# Patient Record
Sex: Male | Born: 1945
Health system: Southern US, Community
[De-identification: ages and names within clinical notes are randomized; demographics above are authoritative.]

## PROBLEM LIST (undated history)

## (undated) DIAGNOSIS — I259 Chronic ischemic heart disease, unspecified: Secondary | ICD-10-CM

## (undated) DIAGNOSIS — J45909 Unspecified asthma, uncomplicated: Secondary | ICD-10-CM

## (undated) DIAGNOSIS — M1990 Unspecified osteoarthritis, unspecified site: Secondary | ICD-10-CM

## (undated) DIAGNOSIS — I10 Essential (primary) hypertension: Secondary | ICD-10-CM

## (undated) DIAGNOSIS — E119 Type 2 diabetes mellitus without complications: Secondary | ICD-10-CM

## (undated) DIAGNOSIS — J439 Emphysema, unspecified: Secondary | ICD-10-CM

## (undated) HISTORY — DX: Chronic ischemic heart disease, unspecified: I25.9

## (undated) HISTORY — DX: Essential (primary) hypertension: I10

## (undated) HISTORY — DX: Emphysema, unspecified: J43.9

## (undated) HISTORY — PX: HERNIA REPAIR: SHX51

## (undated) HISTORY — DX: Type 2 diabetes mellitus without complications: E11.9

## (undated) HISTORY — DX: Unspecified osteoarthritis, unspecified site: M19.90

## (undated) HISTORY — PX: COLONOSCOPY: SHX174

---

## 2015-07-12 DIAGNOSIS — F329 Major depressive disorder, single episode, unspecified: Secondary | ICD-10-CM | POA: Diagnosis not present

## 2015-07-28 DIAGNOSIS — B351 Tinea unguium: Secondary | ICD-10-CM | POA: Diagnosis not present

## 2015-07-28 DIAGNOSIS — E1140 Type 2 diabetes mellitus with diabetic neuropathy, unspecified: Secondary | ICD-10-CM | POA: Diagnosis not present

## 2015-08-16 DIAGNOSIS — Z87891 Personal history of nicotine dependence: Secondary | ICD-10-CM | POA: Diagnosis not present

## 2015-08-16 DIAGNOSIS — E1165 Type 2 diabetes mellitus with hyperglycemia: Secondary | ICD-10-CM | POA: Diagnosis not present

## 2015-08-16 DIAGNOSIS — L299 Pruritus, unspecified: Secondary | ICD-10-CM | POA: Diagnosis not present

## 2015-09-06 DIAGNOSIS — I10 Essential (primary) hypertension: Secondary | ICD-10-CM | POA: Diagnosis not present

## 2015-09-06 DIAGNOSIS — K219 Gastro-esophageal reflux disease without esophagitis: Secondary | ICD-10-CM | POA: Diagnosis not present

## 2015-09-06 DIAGNOSIS — L0291 Cutaneous abscess, unspecified: Secondary | ICD-10-CM | POA: Diagnosis not present

## 2015-09-16 DIAGNOSIS — J019 Acute sinusitis, unspecified: Secondary | ICD-10-CM | POA: Diagnosis not present

## 2015-09-16 DIAGNOSIS — Z6826 Body mass index (BMI) 26.0-26.9, adult: Secondary | ICD-10-CM | POA: Diagnosis not present

## 2015-09-16 DIAGNOSIS — F419 Anxiety disorder, unspecified: Secondary | ICD-10-CM | POA: Diagnosis not present

## 2015-09-16 DIAGNOSIS — Z87891 Personal history of nicotine dependence: Secondary | ICD-10-CM | POA: Diagnosis not present

## 2015-09-16 DIAGNOSIS — E1165 Type 2 diabetes mellitus with hyperglycemia: Secondary | ICD-10-CM | POA: Diagnosis not present

## 2015-09-16 DIAGNOSIS — I10 Essential (primary) hypertension: Secondary | ICD-10-CM | POA: Diagnosis not present

## 2015-09-22 DIAGNOSIS — Z85828 Personal history of other malignant neoplasm of skin: Secondary | ICD-10-CM | POA: Diagnosis not present

## 2015-09-22 DIAGNOSIS — L905 Scar conditions and fibrosis of skin: Secondary | ICD-10-CM | POA: Diagnosis not present

## 2015-09-22 DIAGNOSIS — L570 Actinic keratosis: Secondary | ICD-10-CM | POA: Diagnosis not present

## 2015-09-22 DIAGNOSIS — D485 Neoplasm of uncertain behavior of skin: Secondary | ICD-10-CM | POA: Diagnosis not present

## 2015-09-22 DIAGNOSIS — L281 Prurigo nodularis: Secondary | ICD-10-CM | POA: Diagnosis not present

## 2015-11-22 DIAGNOSIS — E1165 Type 2 diabetes mellitus with hyperglycemia: Secondary | ICD-10-CM | POA: Diagnosis not present

## 2015-11-22 DIAGNOSIS — Z299 Encounter for prophylactic measures, unspecified: Secondary | ICD-10-CM | POA: Diagnosis not present

## 2015-11-25 DIAGNOSIS — E119 Type 2 diabetes mellitus without complications: Secondary | ICD-10-CM | POA: Diagnosis not present

## 2015-11-25 DIAGNOSIS — I10 Essential (primary) hypertension: Secondary | ICD-10-CM | POA: Diagnosis not present

## 2015-12-22 DIAGNOSIS — Z23 Encounter for immunization: Secondary | ICD-10-CM | POA: Diagnosis not present

## 2016-01-10 DIAGNOSIS — F329 Major depressive disorder, single episode, unspecified: Secondary | ICD-10-CM | POA: Diagnosis not present

## 2016-01-10 DIAGNOSIS — Z79899 Other long term (current) drug therapy: Secondary | ICD-10-CM | POA: Diagnosis not present

## 2016-01-26 DIAGNOSIS — E1140 Type 2 diabetes mellitus with diabetic neuropathy, unspecified: Secondary | ICD-10-CM | POA: Diagnosis not present

## 2016-01-26 DIAGNOSIS — B351 Tinea unguium: Secondary | ICD-10-CM | POA: Diagnosis not present

## 2016-02-02 DIAGNOSIS — I10 Essential (primary) hypertension: Secondary | ICD-10-CM | POA: Diagnosis not present

## 2016-02-02 DIAGNOSIS — E119 Type 2 diabetes mellitus without complications: Secondary | ICD-10-CM | POA: Diagnosis not present

## 2016-02-25 DIAGNOSIS — I10 Essential (primary) hypertension: Secondary | ICD-10-CM | POA: Diagnosis not present

## 2016-02-25 DIAGNOSIS — E785 Hyperlipidemia, unspecified: Secondary | ICD-10-CM | POA: Diagnosis not present

## 2016-02-25 DIAGNOSIS — K219 Gastro-esophageal reflux disease without esophagitis: Secondary | ICD-10-CM | POA: Diagnosis not present

## 2016-02-25 DIAGNOSIS — E1165 Type 2 diabetes mellitus with hyperglycemia: Secondary | ICD-10-CM | POA: Diagnosis not present

## 2016-03-28 DIAGNOSIS — L570 Actinic keratosis: Secondary | ICD-10-CM | POA: Diagnosis not present

## 2016-04-03 DIAGNOSIS — Z23 Encounter for immunization: Secondary | ICD-10-CM | POA: Diagnosis not present

## 2016-04-19 DIAGNOSIS — B351 Tinea unguium: Secondary | ICD-10-CM | POA: Diagnosis not present

## 2016-04-19 DIAGNOSIS — E1140 Type 2 diabetes mellitus with diabetic neuropathy, unspecified: Secondary | ICD-10-CM | POA: Diagnosis not present

## 2016-05-19 DIAGNOSIS — Z1389 Encounter for screening for other disorder: Secondary | ICD-10-CM | POA: Diagnosis not present

## 2016-05-19 DIAGNOSIS — Z6824 Body mass index (BMI) 24.0-24.9, adult: Secondary | ICD-10-CM | POA: Diagnosis not present

## 2016-05-19 DIAGNOSIS — Z125 Encounter for screening for malignant neoplasm of prostate: Secondary | ICD-10-CM | POA: Diagnosis not present

## 2016-05-19 DIAGNOSIS — Z1211 Encounter for screening for malignant neoplasm of colon: Secondary | ICD-10-CM | POA: Diagnosis not present

## 2016-05-19 DIAGNOSIS — Z79899 Other long term (current) drug therapy: Secondary | ICD-10-CM | POA: Diagnosis not present

## 2016-05-19 DIAGNOSIS — Z0000 Encounter for general adult medical examination without abnormal findings: Secondary | ICD-10-CM | POA: Diagnosis not present

## 2016-05-19 DIAGNOSIS — F419 Anxiety disorder, unspecified: Secondary | ICD-10-CM | POA: Diagnosis not present

## 2016-05-19 DIAGNOSIS — Z7189 Other specified counseling: Secondary | ICD-10-CM | POA: Diagnosis not present

## 2016-05-19 DIAGNOSIS — Z299 Encounter for prophylactic measures, unspecified: Secondary | ICD-10-CM | POA: Diagnosis not present

## 2016-05-19 DIAGNOSIS — E1165 Type 2 diabetes mellitus with hyperglycemia: Secondary | ICD-10-CM | POA: Diagnosis not present

## 2016-06-02 DIAGNOSIS — E1165 Type 2 diabetes mellitus with hyperglycemia: Secondary | ICD-10-CM | POA: Diagnosis not present

## 2016-06-02 DIAGNOSIS — Z299 Encounter for prophylactic measures, unspecified: Secondary | ICD-10-CM | POA: Diagnosis not present

## 2016-06-02 DIAGNOSIS — Z6824 Body mass index (BMI) 24.0-24.9, adult: Secondary | ICD-10-CM | POA: Diagnosis not present

## 2016-06-02 DIAGNOSIS — Z713 Dietary counseling and surveillance: Secondary | ICD-10-CM | POA: Diagnosis not present

## 2016-06-19 DIAGNOSIS — I10 Essential (primary) hypertension: Secondary | ICD-10-CM | POA: Diagnosis not present

## 2016-06-19 DIAGNOSIS — E119 Type 2 diabetes mellitus without complications: Secondary | ICD-10-CM | POA: Diagnosis not present

## 2016-06-24 DIAGNOSIS — F329 Major depressive disorder, single episode, unspecified: Secondary | ICD-10-CM | POA: Diagnosis not present

## 2016-06-24 DIAGNOSIS — Z79899 Other long term (current) drug therapy: Secondary | ICD-10-CM | POA: Diagnosis not present

## 2016-08-17 DIAGNOSIS — Z6824 Body mass index (BMI) 24.0-24.9, adult: Secondary | ICD-10-CM | POA: Diagnosis not present

## 2016-08-17 DIAGNOSIS — J069 Acute upper respiratory infection, unspecified: Secondary | ICD-10-CM | POA: Diagnosis not present

## 2016-08-17 DIAGNOSIS — Z299 Encounter for prophylactic measures, unspecified: Secondary | ICD-10-CM | POA: Diagnosis not present

## 2016-08-17 DIAGNOSIS — E1165 Type 2 diabetes mellitus with hyperglycemia: Secondary | ICD-10-CM | POA: Diagnosis not present

## 2016-08-17 DIAGNOSIS — Z87891 Personal history of nicotine dependence: Secondary | ICD-10-CM | POA: Diagnosis not present

## 2016-08-17 DIAGNOSIS — K219 Gastro-esophageal reflux disease without esophagitis: Secondary | ICD-10-CM | POA: Diagnosis not present

## 2016-08-17 DIAGNOSIS — I10 Essential (primary) hypertension: Secondary | ICD-10-CM | POA: Diagnosis not present

## 2016-08-17 DIAGNOSIS — Z713 Dietary counseling and surveillance: Secondary | ICD-10-CM | POA: Diagnosis not present

## 2016-08-28 DIAGNOSIS — E119 Type 2 diabetes mellitus without complications: Secondary | ICD-10-CM | POA: Diagnosis not present

## 2016-08-28 DIAGNOSIS — I10 Essential (primary) hypertension: Secondary | ICD-10-CM | POA: Diagnosis not present

## 2016-09-07 DIAGNOSIS — E785 Hyperlipidemia, unspecified: Secondary | ICD-10-CM | POA: Diagnosis not present

## 2016-09-07 DIAGNOSIS — K219 Gastro-esophageal reflux disease without esophagitis: Secondary | ICD-10-CM | POA: Diagnosis not present

## 2016-09-07 DIAGNOSIS — E1165 Type 2 diabetes mellitus with hyperglycemia: Secondary | ICD-10-CM | POA: Diagnosis not present

## 2016-09-07 DIAGNOSIS — Z6824 Body mass index (BMI) 24.0-24.9, adult: Secondary | ICD-10-CM | POA: Diagnosis not present

## 2016-09-07 DIAGNOSIS — G4700 Insomnia, unspecified: Secondary | ICD-10-CM | POA: Diagnosis not present

## 2016-09-07 DIAGNOSIS — Z713 Dietary counseling and surveillance: Secondary | ICD-10-CM | POA: Diagnosis not present

## 2016-09-07 DIAGNOSIS — Z299 Encounter for prophylactic measures, unspecified: Secondary | ICD-10-CM | POA: Diagnosis not present

## 2016-09-27 DIAGNOSIS — L570 Actinic keratosis: Secondary | ICD-10-CM | POA: Diagnosis not present

## 2016-10-12 DIAGNOSIS — Z299 Encounter for prophylactic measures, unspecified: Secondary | ICD-10-CM | POA: Diagnosis not present

## 2016-10-12 DIAGNOSIS — E1165 Type 2 diabetes mellitus with hyperglycemia: Secondary | ICD-10-CM | POA: Diagnosis not present

## 2016-10-12 DIAGNOSIS — G4700 Insomnia, unspecified: Secondary | ICD-10-CM | POA: Diagnosis not present

## 2016-10-12 DIAGNOSIS — M545 Low back pain: Secondary | ICD-10-CM | POA: Diagnosis not present

## 2016-10-12 DIAGNOSIS — I10 Essential (primary) hypertension: Secondary | ICD-10-CM | POA: Diagnosis not present

## 2016-10-12 DIAGNOSIS — Z6824 Body mass index (BMI) 24.0-24.9, adult: Secondary | ICD-10-CM | POA: Diagnosis not present

## 2016-11-02 DIAGNOSIS — I10 Essential (primary) hypertension: Secondary | ICD-10-CM | POA: Diagnosis not present

## 2016-11-02 DIAGNOSIS — E119 Type 2 diabetes mellitus without complications: Secondary | ICD-10-CM | POA: Diagnosis not present

## 2016-11-08 DIAGNOSIS — B351 Tinea unguium: Secondary | ICD-10-CM | POA: Diagnosis not present

## 2016-11-08 DIAGNOSIS — E1140 Type 2 diabetes mellitus with diabetic neuropathy, unspecified: Secondary | ICD-10-CM | POA: Diagnosis not present

## 2016-12-07 DIAGNOSIS — I10 Essential (primary) hypertension: Secondary | ICD-10-CM | POA: Diagnosis not present

## 2016-12-07 DIAGNOSIS — E119 Type 2 diabetes mellitus without complications: Secondary | ICD-10-CM | POA: Diagnosis not present

## 2016-12-14 DIAGNOSIS — Z299 Encounter for prophylactic measures, unspecified: Secondary | ICD-10-CM | POA: Diagnosis not present

## 2016-12-14 DIAGNOSIS — M545 Low back pain: Secondary | ICD-10-CM | POA: Diagnosis not present

## 2016-12-14 DIAGNOSIS — I10 Essential (primary) hypertension: Secondary | ICD-10-CM | POA: Diagnosis not present

## 2016-12-14 DIAGNOSIS — E1165 Type 2 diabetes mellitus with hyperglycemia: Secondary | ICD-10-CM | POA: Diagnosis not present

## 2016-12-14 DIAGNOSIS — E785 Hyperlipidemia, unspecified: Secondary | ICD-10-CM | POA: Diagnosis not present

## 2016-12-14 DIAGNOSIS — Z713 Dietary counseling and surveillance: Secondary | ICD-10-CM | POA: Diagnosis not present

## 2016-12-14 DIAGNOSIS — K219 Gastro-esophageal reflux disease without esophagitis: Secondary | ICD-10-CM | POA: Diagnosis not present

## 2016-12-14 DIAGNOSIS — F419 Anxiety disorder, unspecified: Secondary | ICD-10-CM | POA: Diagnosis not present

## 2016-12-14 DIAGNOSIS — Z6824 Body mass index (BMI) 24.0-24.9, adult: Secondary | ICD-10-CM | POA: Diagnosis not present

## 2016-12-18 DIAGNOSIS — F329 Major depressive disorder, single episode, unspecified: Secondary | ICD-10-CM | POA: Diagnosis not present

## 2016-12-18 DIAGNOSIS — Z79899 Other long term (current) drug therapy: Secondary | ICD-10-CM | POA: Diagnosis not present

## 2016-12-21 DIAGNOSIS — M545 Low back pain: Secondary | ICD-10-CM | POA: Diagnosis not present

## 2016-12-27 DIAGNOSIS — M545 Low back pain: Secondary | ICD-10-CM | POA: Diagnosis not present

## 2016-12-29 DIAGNOSIS — M545 Low back pain: Secondary | ICD-10-CM | POA: Diagnosis not present

## 2017-01-03 DIAGNOSIS — M545 Low back pain: Secondary | ICD-10-CM | POA: Diagnosis not present

## 2017-01-05 DIAGNOSIS — M545 Low back pain: Secondary | ICD-10-CM | POA: Diagnosis not present

## 2017-01-09 DIAGNOSIS — Z6823 Body mass index (BMI) 23.0-23.9, adult: Secondary | ICD-10-CM | POA: Diagnosis not present

## 2017-01-09 DIAGNOSIS — I10 Essential (primary) hypertension: Secondary | ICD-10-CM | POA: Diagnosis not present

## 2017-01-09 DIAGNOSIS — M545 Low back pain: Secondary | ICD-10-CM | POA: Diagnosis not present

## 2017-01-09 DIAGNOSIS — Z299 Encounter for prophylactic measures, unspecified: Secondary | ICD-10-CM | POA: Diagnosis not present

## 2017-01-09 DIAGNOSIS — M5136 Other intervertebral disc degeneration, lumbar region: Secondary | ICD-10-CM | POA: Diagnosis not present

## 2017-01-09 DIAGNOSIS — G4762 Sleep related leg cramps: Secondary | ICD-10-CM | POA: Diagnosis not present

## 2017-01-09 DIAGNOSIS — E1165 Type 2 diabetes mellitus with hyperglycemia: Secondary | ICD-10-CM | POA: Diagnosis not present

## 2017-01-15 DIAGNOSIS — M4807 Spinal stenosis, lumbosacral region: Secondary | ICD-10-CM | POA: Diagnosis not present

## 2017-01-15 DIAGNOSIS — M545 Low back pain: Secondary | ICD-10-CM | POA: Diagnosis not present

## 2017-01-15 DIAGNOSIS — M47817 Spondylosis without myelopathy or radiculopathy, lumbosacral region: Secondary | ICD-10-CM | POA: Diagnosis not present

## 2017-01-18 DIAGNOSIS — E119 Type 2 diabetes mellitus without complications: Secondary | ICD-10-CM | POA: Diagnosis not present

## 2017-01-18 DIAGNOSIS — I10 Essential (primary) hypertension: Secondary | ICD-10-CM | POA: Diagnosis not present

## 2017-01-31 DIAGNOSIS — B351 Tinea unguium: Secondary | ICD-10-CM | POA: Diagnosis not present

## 2017-01-31 DIAGNOSIS — E1140 Type 2 diabetes mellitus with diabetic neuropathy, unspecified: Secondary | ICD-10-CM | POA: Diagnosis not present

## 2017-02-20 DIAGNOSIS — Z6824 Body mass index (BMI) 24.0-24.9, adult: Secondary | ICD-10-CM | POA: Diagnosis not present

## 2017-02-20 DIAGNOSIS — E785 Hyperlipidemia, unspecified: Secondary | ICD-10-CM | POA: Diagnosis not present

## 2017-02-20 DIAGNOSIS — Z299 Encounter for prophylactic measures, unspecified: Secondary | ICD-10-CM | POA: Diagnosis not present

## 2017-02-20 DIAGNOSIS — Z713 Dietary counseling and surveillance: Secondary | ICD-10-CM | POA: Diagnosis not present

## 2017-02-20 DIAGNOSIS — J069 Acute upper respiratory infection, unspecified: Secondary | ICD-10-CM | POA: Diagnosis not present

## 2017-02-20 DIAGNOSIS — E1165 Type 2 diabetes mellitus with hyperglycemia: Secondary | ICD-10-CM | POA: Diagnosis not present

## 2017-02-20 DIAGNOSIS — F419 Anxiety disorder, unspecified: Secondary | ICD-10-CM | POA: Diagnosis not present

## 2017-02-20 DIAGNOSIS — M545 Low back pain: Secondary | ICD-10-CM | POA: Diagnosis not present

## 2017-02-20 DIAGNOSIS — I10 Essential (primary) hypertension: Secondary | ICD-10-CM | POA: Diagnosis not present

## 2017-03-21 DIAGNOSIS — J309 Allergic rhinitis, unspecified: Secondary | ICD-10-CM | POA: Diagnosis not present

## 2017-03-21 DIAGNOSIS — E785 Hyperlipidemia, unspecified: Secondary | ICD-10-CM | POA: Diagnosis not present

## 2017-03-21 DIAGNOSIS — M5136 Other intervertebral disc degeneration, lumbar region: Secondary | ICD-10-CM | POA: Diagnosis not present

## 2017-03-21 DIAGNOSIS — E1165 Type 2 diabetes mellitus with hyperglycemia: Secondary | ICD-10-CM | POA: Diagnosis not present

## 2017-03-21 DIAGNOSIS — Z299 Encounter for prophylactic measures, unspecified: Secondary | ICD-10-CM | POA: Diagnosis not present

## 2017-03-21 DIAGNOSIS — Z6824 Body mass index (BMI) 24.0-24.9, adult: Secondary | ICD-10-CM | POA: Diagnosis not present

## 2017-03-21 DIAGNOSIS — I10 Essential (primary) hypertension: Secondary | ICD-10-CM | POA: Diagnosis not present

## 2017-04-02 DIAGNOSIS — L280 Lichen simplex chronicus: Secondary | ICD-10-CM | POA: Diagnosis not present

## 2017-04-02 DIAGNOSIS — L720 Epidermal cyst: Secondary | ICD-10-CM | POA: Diagnosis not present

## 2017-04-02 DIAGNOSIS — L570 Actinic keratosis: Secondary | ICD-10-CM | POA: Diagnosis not present

## 2017-04-05 DIAGNOSIS — M4696 Unspecified inflammatory spondylopathy, lumbar region: Secondary | ICD-10-CM | POA: Diagnosis not present

## 2017-04-11 DIAGNOSIS — E1165 Type 2 diabetes mellitus with hyperglycemia: Secondary | ICD-10-CM | POA: Diagnosis not present

## 2017-04-11 DIAGNOSIS — M5136 Other intervertebral disc degeneration, lumbar region: Secondary | ICD-10-CM | POA: Diagnosis not present

## 2017-04-11 DIAGNOSIS — Z6823 Body mass index (BMI) 23.0-23.9, adult: Secondary | ICD-10-CM | POA: Diagnosis not present

## 2017-04-11 DIAGNOSIS — Z23 Encounter for immunization: Secondary | ICD-10-CM | POA: Diagnosis not present

## 2017-04-11 DIAGNOSIS — Z299 Encounter for prophylactic measures, unspecified: Secondary | ICD-10-CM | POA: Diagnosis not present

## 2017-04-11 DIAGNOSIS — I10 Essential (primary) hypertension: Secondary | ICD-10-CM | POA: Diagnosis not present

## 2017-04-19 DIAGNOSIS — I10 Essential (primary) hypertension: Secondary | ICD-10-CM | POA: Diagnosis not present

## 2017-04-19 DIAGNOSIS — E119 Type 2 diabetes mellitus without complications: Secondary | ICD-10-CM | POA: Diagnosis not present

## 2017-04-25 DIAGNOSIS — B351 Tinea unguium: Secondary | ICD-10-CM | POA: Diagnosis not present

## 2017-04-25 DIAGNOSIS — E1140 Type 2 diabetes mellitus with diabetic neuropathy, unspecified: Secondary | ICD-10-CM | POA: Diagnosis not present

## 2017-05-22 DIAGNOSIS — E119 Type 2 diabetes mellitus without complications: Secondary | ICD-10-CM | POA: Diagnosis not present

## 2017-05-22 DIAGNOSIS — I10 Essential (primary) hypertension: Secondary | ICD-10-CM | POA: Diagnosis not present

## 2017-05-24 DIAGNOSIS — Z7189 Other specified counseling: Secondary | ICD-10-CM | POA: Diagnosis not present

## 2017-05-24 DIAGNOSIS — R5383 Other fatigue: Secondary | ICD-10-CM | POA: Diagnosis not present

## 2017-05-24 DIAGNOSIS — Z79899 Other long term (current) drug therapy: Secondary | ICD-10-CM | POA: Diagnosis not present

## 2017-05-24 DIAGNOSIS — Z125 Encounter for screening for malignant neoplasm of prostate: Secondary | ICD-10-CM | POA: Diagnosis not present

## 2017-05-24 DIAGNOSIS — Z1331 Encounter for screening for depression: Secondary | ICD-10-CM | POA: Diagnosis not present

## 2017-05-24 DIAGNOSIS — E785 Hyperlipidemia, unspecified: Secondary | ICD-10-CM | POA: Diagnosis not present

## 2017-05-24 DIAGNOSIS — Z1339 Encounter for screening examination for other mental health and behavioral disorders: Secondary | ICD-10-CM | POA: Diagnosis not present

## 2017-05-24 DIAGNOSIS — F419 Anxiety disorder, unspecified: Secondary | ICD-10-CM | POA: Diagnosis not present

## 2017-05-24 DIAGNOSIS — E1165 Type 2 diabetes mellitus with hyperglycemia: Secondary | ICD-10-CM | POA: Diagnosis not present

## 2017-05-24 DIAGNOSIS — Z299 Encounter for prophylactic measures, unspecified: Secondary | ICD-10-CM | POA: Diagnosis not present

## 2017-05-24 DIAGNOSIS — Z0000 Encounter for general adult medical examination without abnormal findings: Secondary | ICD-10-CM | POA: Diagnosis not present

## 2017-05-24 DIAGNOSIS — Z1211 Encounter for screening for malignant neoplasm of colon: Secondary | ICD-10-CM | POA: Diagnosis not present

## 2017-06-04 DIAGNOSIS — F329 Major depressive disorder, single episode, unspecified: Secondary | ICD-10-CM | POA: Diagnosis not present

## 2017-06-04 DIAGNOSIS — Z79899 Other long term (current) drug therapy: Secondary | ICD-10-CM | POA: Diagnosis not present

## 2017-06-15 DIAGNOSIS — M545 Low back pain: Secondary | ICD-10-CM | POA: Diagnosis not present

## 2017-06-15 DIAGNOSIS — M25562 Pain in left knee: Secondary | ICD-10-CM | POA: Diagnosis not present

## 2017-06-15 DIAGNOSIS — Z79891 Long term (current) use of opiate analgesic: Secondary | ICD-10-CM | POA: Diagnosis not present

## 2017-06-15 DIAGNOSIS — M130 Polyarthritis, unspecified: Secondary | ICD-10-CM | POA: Diagnosis not present

## 2017-06-15 DIAGNOSIS — M542 Cervicalgia: Secondary | ICD-10-CM | POA: Diagnosis not present

## 2017-06-15 DIAGNOSIS — M25569 Pain in unspecified knee: Secondary | ICD-10-CM | POA: Diagnosis not present

## 2017-06-27 DIAGNOSIS — Z6823 Body mass index (BMI) 23.0-23.9, adult: Secondary | ICD-10-CM | POA: Diagnosis not present

## 2017-06-27 DIAGNOSIS — Z713 Dietary counseling and surveillance: Secondary | ICD-10-CM | POA: Diagnosis not present

## 2017-06-27 DIAGNOSIS — E1165 Type 2 diabetes mellitus with hyperglycemia: Secondary | ICD-10-CM | POA: Diagnosis not present

## 2017-06-27 DIAGNOSIS — I10 Essential (primary) hypertension: Secondary | ICD-10-CM | POA: Diagnosis not present

## 2017-06-27 DIAGNOSIS — Z299 Encounter for prophylactic measures, unspecified: Secondary | ICD-10-CM | POA: Diagnosis not present

## 2017-07-18 DIAGNOSIS — E1140 Type 2 diabetes mellitus with diabetic neuropathy, unspecified: Secondary | ICD-10-CM | POA: Diagnosis not present

## 2017-07-18 DIAGNOSIS — B351 Tinea unguium: Secondary | ICD-10-CM | POA: Diagnosis not present

## 2017-07-25 DIAGNOSIS — Z79891 Long term (current) use of opiate analgesic: Secondary | ICD-10-CM | POA: Diagnosis not present

## 2017-07-25 DIAGNOSIS — M545 Low back pain: Secondary | ICD-10-CM | POA: Diagnosis not present

## 2017-07-25 DIAGNOSIS — M542 Cervicalgia: Secondary | ICD-10-CM | POA: Diagnosis not present

## 2017-07-25 DIAGNOSIS — M25569 Pain in unspecified knee: Secondary | ICD-10-CM | POA: Diagnosis not present

## 2017-07-25 DIAGNOSIS — M130 Polyarthritis, unspecified: Secondary | ICD-10-CM | POA: Diagnosis not present

## 2017-07-30 DIAGNOSIS — E1165 Type 2 diabetes mellitus with hyperglycemia: Secondary | ICD-10-CM | POA: Diagnosis not present

## 2017-07-30 DIAGNOSIS — Z299 Encounter for prophylactic measures, unspecified: Secondary | ICD-10-CM | POA: Diagnosis not present

## 2017-07-30 DIAGNOSIS — Z87891 Personal history of nicotine dependence: Secondary | ICD-10-CM | POA: Diagnosis not present

## 2017-07-30 DIAGNOSIS — I10 Essential (primary) hypertension: Secondary | ICD-10-CM | POA: Diagnosis not present

## 2017-07-30 DIAGNOSIS — Z6824 Body mass index (BMI) 24.0-24.9, adult: Secondary | ICD-10-CM | POA: Diagnosis not present

## 2017-07-30 DIAGNOSIS — J309 Allergic rhinitis, unspecified: Secondary | ICD-10-CM | POA: Diagnosis not present

## 2017-07-30 DIAGNOSIS — L0291 Cutaneous abscess, unspecified: Secondary | ICD-10-CM | POA: Diagnosis not present

## 2017-08-07 DIAGNOSIS — E119 Type 2 diabetes mellitus without complications: Secondary | ICD-10-CM | POA: Diagnosis not present

## 2017-08-07 DIAGNOSIS — I10 Essential (primary) hypertension: Secondary | ICD-10-CM | POA: Diagnosis not present

## 2017-08-13 DIAGNOSIS — Z789 Other specified health status: Secondary | ICD-10-CM | POA: Diagnosis not present

## 2017-08-13 DIAGNOSIS — I10 Essential (primary) hypertension: Secondary | ICD-10-CM | POA: Diagnosis not present

## 2017-08-13 DIAGNOSIS — Z299 Encounter for prophylactic measures, unspecified: Secondary | ICD-10-CM | POA: Diagnosis not present

## 2017-08-13 DIAGNOSIS — E1165 Type 2 diabetes mellitus with hyperglycemia: Secondary | ICD-10-CM | POA: Diagnosis not present

## 2017-08-13 DIAGNOSIS — Z6824 Body mass index (BMI) 24.0-24.9, adult: Secondary | ICD-10-CM | POA: Diagnosis not present

## 2017-08-13 DIAGNOSIS — J069 Acute upper respiratory infection, unspecified: Secondary | ICD-10-CM | POA: Diagnosis not present

## 2017-08-27 DIAGNOSIS — M130 Polyarthritis, unspecified: Secondary | ICD-10-CM | POA: Diagnosis not present

## 2017-08-27 DIAGNOSIS — M25569 Pain in unspecified knee: Secondary | ICD-10-CM | POA: Diagnosis not present

## 2017-08-27 DIAGNOSIS — M545 Low back pain: Secondary | ICD-10-CM | POA: Diagnosis not present

## 2017-08-27 DIAGNOSIS — M542 Cervicalgia: Secondary | ICD-10-CM | POA: Diagnosis not present

## 2017-08-30 DIAGNOSIS — Z6825 Body mass index (BMI) 25.0-25.9, adult: Secondary | ICD-10-CM | POA: Diagnosis not present

## 2017-08-30 DIAGNOSIS — E1165 Type 2 diabetes mellitus with hyperglycemia: Secondary | ICD-10-CM | POA: Diagnosis not present

## 2017-08-30 DIAGNOSIS — K047 Periapical abscess without sinus: Secondary | ICD-10-CM | POA: Diagnosis not present

## 2017-08-30 DIAGNOSIS — I10 Essential (primary) hypertension: Secondary | ICD-10-CM | POA: Diagnosis not present

## 2017-08-30 DIAGNOSIS — J069 Acute upper respiratory infection, unspecified: Secondary | ICD-10-CM | POA: Diagnosis not present

## 2017-08-30 DIAGNOSIS — Z299 Encounter for prophylactic measures, unspecified: Secondary | ICD-10-CM | POA: Diagnosis not present

## 2017-09-12 DIAGNOSIS — E1165 Type 2 diabetes mellitus with hyperglycemia: Secondary | ICD-10-CM | POA: Diagnosis not present

## 2017-09-12 DIAGNOSIS — Z6824 Body mass index (BMI) 24.0-24.9, adult: Secondary | ICD-10-CM | POA: Diagnosis not present

## 2017-09-12 DIAGNOSIS — J069 Acute upper respiratory infection, unspecified: Secondary | ICD-10-CM | POA: Diagnosis not present

## 2017-09-12 DIAGNOSIS — Z789 Other specified health status: Secondary | ICD-10-CM | POA: Diagnosis not present

## 2017-09-12 DIAGNOSIS — Z299 Encounter for prophylactic measures, unspecified: Secondary | ICD-10-CM | POA: Diagnosis not present

## 2017-09-12 DIAGNOSIS — I10 Essential (primary) hypertension: Secondary | ICD-10-CM | POA: Diagnosis not present

## 2017-09-17 DIAGNOSIS — L309 Dermatitis, unspecified: Secondary | ICD-10-CM | POA: Diagnosis not present

## 2017-09-17 DIAGNOSIS — D485 Neoplasm of uncertain behavior of skin: Secondary | ICD-10-CM | POA: Diagnosis not present

## 2017-09-17 DIAGNOSIS — L905 Scar conditions and fibrosis of skin: Secondary | ICD-10-CM | POA: Diagnosis not present

## 2017-09-17 DIAGNOSIS — L570 Actinic keratosis: Secondary | ICD-10-CM | POA: Diagnosis not present

## 2017-09-17 DIAGNOSIS — L280 Lichen simplex chronicus: Secondary | ICD-10-CM | POA: Diagnosis not present

## 2017-09-20 DIAGNOSIS — M25569 Pain in unspecified knee: Secondary | ICD-10-CM | POA: Diagnosis not present

## 2017-09-20 DIAGNOSIS — M542 Cervicalgia: Secondary | ICD-10-CM | POA: Diagnosis not present

## 2017-09-20 DIAGNOSIS — M130 Polyarthritis, unspecified: Secondary | ICD-10-CM | POA: Diagnosis not present

## 2017-09-20 DIAGNOSIS — M545 Low back pain: Secondary | ICD-10-CM | POA: Diagnosis not present

## 2017-09-24 DIAGNOSIS — E119 Type 2 diabetes mellitus without complications: Secondary | ICD-10-CM | POA: Diagnosis not present

## 2017-09-24 DIAGNOSIS — I10 Essential (primary) hypertension: Secondary | ICD-10-CM | POA: Diagnosis not present

## 2017-10-02 DIAGNOSIS — J069 Acute upper respiratory infection, unspecified: Secondary | ICD-10-CM | POA: Diagnosis not present

## 2017-10-02 DIAGNOSIS — Z299 Encounter for prophylactic measures, unspecified: Secondary | ICD-10-CM | POA: Diagnosis not present

## 2017-10-02 DIAGNOSIS — Z713 Dietary counseling and surveillance: Secondary | ICD-10-CM | POA: Diagnosis not present

## 2017-10-02 DIAGNOSIS — E1165 Type 2 diabetes mellitus with hyperglycemia: Secondary | ICD-10-CM | POA: Diagnosis not present

## 2017-10-02 DIAGNOSIS — Z6824 Body mass index (BMI) 24.0-24.9, adult: Secondary | ICD-10-CM | POA: Diagnosis not present

## 2017-10-12 DIAGNOSIS — E119 Type 2 diabetes mellitus without complications: Secondary | ICD-10-CM | POA: Diagnosis not present

## 2017-10-12 DIAGNOSIS — I10 Essential (primary) hypertension: Secondary | ICD-10-CM | POA: Diagnosis not present

## 2017-10-18 DIAGNOSIS — M130 Polyarthritis, unspecified: Secondary | ICD-10-CM | POA: Diagnosis not present

## 2017-10-18 DIAGNOSIS — M542 Cervicalgia: Secondary | ICD-10-CM | POA: Diagnosis not present

## 2017-10-18 DIAGNOSIS — M545 Low back pain: Secondary | ICD-10-CM | POA: Diagnosis not present

## 2017-10-18 DIAGNOSIS — G894 Chronic pain syndrome: Secondary | ICD-10-CM | POA: Diagnosis not present

## 2017-10-18 DIAGNOSIS — M25569 Pain in unspecified knee: Secondary | ICD-10-CM | POA: Diagnosis not present

## 2017-10-18 DIAGNOSIS — Z79899 Other long term (current) drug therapy: Secondary | ICD-10-CM | POA: Diagnosis not present

## 2017-10-22 DIAGNOSIS — R918 Other nonspecific abnormal finding of lung field: Secondary | ICD-10-CM | POA: Diagnosis not present

## 2017-10-22 DIAGNOSIS — R05 Cough: Secondary | ICD-10-CM | POA: Diagnosis not present

## 2017-10-22 DIAGNOSIS — J069 Acute upper respiratory infection, unspecified: Secondary | ICD-10-CM | POA: Diagnosis not present

## 2017-10-25 DIAGNOSIS — B351 Tinea unguium: Secondary | ICD-10-CM | POA: Diagnosis not present

## 2017-10-25 DIAGNOSIS — E1140 Type 2 diabetes mellitus with diabetic neuropathy, unspecified: Secondary | ICD-10-CM | POA: Diagnosis not present

## 2017-10-30 DIAGNOSIS — Z713 Dietary counseling and surveillance: Secondary | ICD-10-CM | POA: Diagnosis not present

## 2017-10-30 DIAGNOSIS — Z8701 Personal history of pneumonia (recurrent): Secondary | ICD-10-CM | POA: Diagnosis not present

## 2017-10-30 DIAGNOSIS — Z87891 Personal history of nicotine dependence: Secondary | ICD-10-CM | POA: Diagnosis not present

## 2017-10-30 DIAGNOSIS — J189 Pneumonia, unspecified organism: Secondary | ICD-10-CM | POA: Diagnosis not present

## 2017-10-30 DIAGNOSIS — Z299 Encounter for prophylactic measures, unspecified: Secondary | ICD-10-CM | POA: Diagnosis not present

## 2017-10-30 DIAGNOSIS — Z6822 Body mass index (BMI) 22.0-22.9, adult: Secondary | ICD-10-CM | POA: Diagnosis not present

## 2017-10-30 DIAGNOSIS — I10 Essential (primary) hypertension: Secondary | ICD-10-CM | POA: Diagnosis not present

## 2017-11-22 DIAGNOSIS — R1110 Vomiting, unspecified: Secondary | ICD-10-CM | POA: Diagnosis not present

## 2017-11-22 DIAGNOSIS — Z299 Encounter for prophylactic measures, unspecified: Secondary | ICD-10-CM | POA: Diagnosis not present

## 2017-11-22 DIAGNOSIS — R42 Dizziness and giddiness: Secondary | ICD-10-CM | POA: Diagnosis not present

## 2017-11-22 DIAGNOSIS — J189 Pneumonia, unspecified organism: Secondary | ICD-10-CM | POA: Diagnosis not present

## 2017-11-22 DIAGNOSIS — I10 Essential (primary) hypertension: Secondary | ICD-10-CM | POA: Diagnosis not present

## 2017-11-22 DIAGNOSIS — Z6823 Body mass index (BMI) 23.0-23.9, adult: Secondary | ICD-10-CM | POA: Diagnosis not present

## 2017-11-23 DIAGNOSIS — I10 Essential (primary) hypertension: Secondary | ICD-10-CM | POA: Diagnosis not present

## 2017-11-23 DIAGNOSIS — E119 Type 2 diabetes mellitus without complications: Secondary | ICD-10-CM | POA: Diagnosis not present

## 2017-12-17 DIAGNOSIS — E538 Deficiency of other specified B group vitamins: Secondary | ICD-10-CM | POA: Diagnosis not present

## 2017-12-17 DIAGNOSIS — E1165 Type 2 diabetes mellitus with hyperglycemia: Secondary | ICD-10-CM | POA: Diagnosis not present

## 2017-12-17 DIAGNOSIS — J069 Acute upper respiratory infection, unspecified: Secondary | ICD-10-CM | POA: Diagnosis not present

## 2017-12-17 DIAGNOSIS — Z6823 Body mass index (BMI) 23.0-23.9, adult: Secondary | ICD-10-CM | POA: Diagnosis not present

## 2017-12-17 DIAGNOSIS — G4700 Insomnia, unspecified: Secondary | ICD-10-CM | POA: Diagnosis not present

## 2017-12-17 DIAGNOSIS — M545 Low back pain: Secondary | ICD-10-CM | POA: Diagnosis not present

## 2017-12-17 DIAGNOSIS — R0600 Dyspnea, unspecified: Secondary | ICD-10-CM | POA: Diagnosis not present

## 2017-12-17 DIAGNOSIS — I10 Essential (primary) hypertension: Secondary | ICD-10-CM | POA: Diagnosis not present

## 2017-12-17 DIAGNOSIS — M130 Polyarthritis, unspecified: Secondary | ICD-10-CM | POA: Diagnosis not present

## 2017-12-17 DIAGNOSIS — M542 Cervicalgia: Secondary | ICD-10-CM | POA: Diagnosis not present

## 2017-12-17 DIAGNOSIS — M25569 Pain in unspecified knee: Secondary | ICD-10-CM | POA: Diagnosis not present

## 2017-12-17 DIAGNOSIS — Z299 Encounter for prophylactic measures, unspecified: Secondary | ICD-10-CM | POA: Diagnosis not present

## 2017-12-21 DIAGNOSIS — Z7984 Long term (current) use of oral hypoglycemic drugs: Secondary | ICD-10-CM | POA: Diagnosis not present

## 2017-12-21 DIAGNOSIS — J439 Emphysema, unspecified: Secondary | ICD-10-CM | POA: Diagnosis not present

## 2017-12-21 DIAGNOSIS — K449 Diaphragmatic hernia without obstruction or gangrene: Secondary | ICD-10-CM | POA: Diagnosis not present

## 2017-12-21 DIAGNOSIS — I700 Atherosclerosis of aorta: Secondary | ICD-10-CM | POA: Diagnosis not present

## 2017-12-21 DIAGNOSIS — E119 Type 2 diabetes mellitus without complications: Secondary | ICD-10-CM | POA: Diagnosis not present

## 2017-12-21 DIAGNOSIS — R918 Other nonspecific abnormal finding of lung field: Secondary | ICD-10-CM | POA: Diagnosis not present

## 2017-12-21 DIAGNOSIS — I2510 Atherosclerotic heart disease of native coronary artery without angina pectoris: Secondary | ICD-10-CM | POA: Diagnosis not present

## 2017-12-22 DIAGNOSIS — F329 Major depressive disorder, single episode, unspecified: Secondary | ICD-10-CM | POA: Diagnosis not present

## 2017-12-27 DIAGNOSIS — E1165 Type 2 diabetes mellitus with hyperglycemia: Secondary | ICD-10-CM | POA: Diagnosis not present

## 2017-12-27 DIAGNOSIS — Z299 Encounter for prophylactic measures, unspecified: Secondary | ICD-10-CM | POA: Diagnosis not present

## 2017-12-27 DIAGNOSIS — Z6823 Body mass index (BMI) 23.0-23.9, adult: Secondary | ICD-10-CM | POA: Diagnosis not present

## 2017-12-27 DIAGNOSIS — I10 Essential (primary) hypertension: Secondary | ICD-10-CM | POA: Diagnosis not present

## 2017-12-27 DIAGNOSIS — L309 Dermatitis, unspecified: Secondary | ICD-10-CM | POA: Diagnosis not present

## 2018-01-04 DIAGNOSIS — I10 Essential (primary) hypertension: Secondary | ICD-10-CM | POA: Diagnosis not present

## 2018-01-04 DIAGNOSIS — E119 Type 2 diabetes mellitus without complications: Secondary | ICD-10-CM | POA: Diagnosis not present

## 2018-01-15 DIAGNOSIS — Z6823 Body mass index (BMI) 23.0-23.9, adult: Secondary | ICD-10-CM | POA: Diagnosis not present

## 2018-01-15 DIAGNOSIS — Z299 Encounter for prophylactic measures, unspecified: Secondary | ICD-10-CM | POA: Diagnosis not present

## 2018-01-15 DIAGNOSIS — Z713 Dietary counseling and surveillance: Secondary | ICD-10-CM | POA: Diagnosis not present

## 2018-01-15 DIAGNOSIS — I10 Essential (primary) hypertension: Secondary | ICD-10-CM | POA: Diagnosis not present

## 2018-01-15 DIAGNOSIS — E1165 Type 2 diabetes mellitus with hyperglycemia: Secondary | ICD-10-CM | POA: Diagnosis not present

## 2018-01-22 ENCOUNTER — Ambulatory Visit (INDEPENDENT_AMBULATORY_CARE_PROVIDER_SITE_OTHER): Payer: Medicare Other | Admitting: Pulmonary Disease

## 2018-01-22 ENCOUNTER — Encounter: Payer: Self-pay | Admitting: Pulmonary Disease

## 2018-01-22 VITALS — BP 124/76 | HR 69 | Ht 65.0 in | Wt 151.2 lb

## 2018-01-22 DIAGNOSIS — J439 Emphysema, unspecified: Secondary | ICD-10-CM | POA: Insufficient documentation

## 2018-01-22 DIAGNOSIS — R911 Solitary pulmonary nodule: Secondary | ICD-10-CM | POA: Diagnosis not present

## 2018-01-22 DIAGNOSIS — R918 Other nonspecific abnormal finding of lung field: Secondary | ICD-10-CM | POA: Insufficient documentation

## 2018-01-22 NOTE — Progress Notes (Signed)
Travis Beck    [id number]    [birth year]  Primary Care 73, Costella Hatcher, MD  Referring Physician: Monico Blitz, [address] [place], [place] [address]  Chief complaint: Consult for recurrent pneumonia, abnormal CT scan  HPI: 72 year old with history of allergic rhinitis, hyperlipidemia, type 2 diabetes, former smoker Treated for pneumonia in May-June 2019.  He required 3 rounds of antibiotic to finally get symptoms under control.  He had imaging including CT of the chest which showed bilateral infiltrates, nodular opacities and has been referred here for further evaluation  States that at present his breathing is back to baseline he has mild dyspnea with activity, cough with clear mucus.  Denies any fevers, chills.  Pets: Has a dog, no cats, birds, farm animals Occupation: Used to work from [employer].  Currently retired Exposures: No known exposures, no leak, hot tub, Jacuzzi, mold Smoking history: 35-pack-year smoker.  Quit in 1994 Travel history: No significant travel.  Outpatient Encounter Medications as of 01/22/2018  Medication Sig  . ALPRAZolam (XANAX) 1 MG tablet Take by mouth.  Marland Kitchen amLODipine (NORVASC) 10 MG tablet Take 10 mg by mouth daily.  . buprenorphine (SUBUTEX) 2 MG SUBL SL tablet 4 mg daily.  . cyanocobalamin (,VITAMIN B-12,) 1000 MCG/ML injection INJECT 1ML INTO THE SKIN ONCE A MONTH  . diclofenac (VOLTAREN) 75 MG EC tablet Take by mouth.  . fluticasone (FLONASE) 50 MCG/ACT nasal spray INSTILL 2 SPRAYS IN EACH NOSTRIL EVERY DAY  . gabapentin (NEURONTIN) 400 MG capsule Take 400 mg by mouth 3 (three) times daily.  Marland Kitchen glyBURIDE (DIABETA) 5 MG tablet TAKE TWO TABLETS BY MOUTH TWICE DAILY EMERGENCY REFILL FAXED DR.  . hydrALAZINE (APRESOLINE) 25 MG tablet Take by mouth.  Marland Kitchen ibuprofen (ADVIL,MOTRIN) 800 MG tablet Take by mouth.  . meclizine (ANTIVERT) 25 MG tablet TAKE 1 TABLET BY MOUTH EVERY 8 HOURS AS NEEDED DIZZINESS  . metFORMIN (GLUCOPHAGE)  1000 MG tablet Take by mouth.  Marland Kitchen omeprazole (PRILOSEC) 20 MG capsule Take by mouth.  . ondansetron (ZOFRAN) 4 MG tablet Take 4 mg by mouth every 8 (eight) hours as needed. for nausea  . rOPINIRole (REQUIP) 2 MG tablet Take 2 mg by mouth at bedtime.  . sitaGLIPtin (JANUVIA) 100 MG tablet Take by mouth.  . triamcinolone cream (KENALOG) 0.1 % APPLY TO THE AFFECTED AREA(S) EVERY 8 HOURS AS NEEDED  . VENTOLIN HFA 108 (90 Base) MCG/ACT inhaler inhale ONE TO TWO puffs EVERY 4 TO 6 HOURS AS NEEDED  . [DISCONTINUED] traZODone (DESYREL) 150 MG tablet Take by mouth.  . [DISCONTINUED] zolpidem (AMBIEN) 5 MG tablet Take 5 mg by mouth at bedtime.   No facility-administered encounter medications on file as of 01/22/2018.     Allergies as of 01/22/2018 - Review Complete 01/22/2018  Allergen Reaction Noted  . Penicillins Rash 12/14/2014    No past medical history on file.  History reviewed. No pertinent surgical history.  No family history on file.  Social History   Socioeconomic History  . Marital status: Married    Spouse name: Not on file  . Number of children: Not on file  . Years of education: Not on file  . Highest education level: Not on file  Occupational History  . Not on file  Social Needs  . Financial resource strain: Not on file  . Food insecurity:    Worry: Not on file    Inability: Not on file  . Transportation needs:  Medical: Not on file    Non-medical: Not on file  Tobacco Use  . Smoking status: Former Smoker    Packs/day: 2.00    Years: 33.00    Pack years: 66.00    Types: Cigarettes    Last attempt to quit: 01/22/1993    Years since quitting: 25.0  . Smokeless tobacco: Never Used  Substance and Sexual Activity  . Alcohol use: Not on file  . Drug use: Not on file  . Sexual activity: Not on file  Lifestyle  . Physical activity:    Days per week: Not on file    Minutes per session: Not on file  . Stress: Not on file  Relationships  . Social connections:     Talks on phone: Not on file    Gets together: Not on file    Attends religious service: Not on file    Active member of club or organization: Not on file    Attends meetings of clubs or organizations: Not on file    Relationship status: Not on file  . Intimate partner violence:    Fear of current or ex partner: Not on file    Emotionally abused: Not on file    Physically abused: Not on file    Forced sexual activity: Not on file  Other Topics Concern  . Not on file  Social History Narrative  . Not on file   Review of systems: Review of Systems  Constitutional: Negative for fever and chills.  HENT: Negative.   Eyes: Negative for blurred vision.  Respiratory: as per HPI  Cardiovascular: Negative for chest pain and palpitations.  Gastrointestinal: Negative for vomiting, diarrhea, blood per rectum. Genitourinary: Negative for dysuria, urgency, frequency and hematuria.  Musculoskeletal: Negative for myalgias, back pain and joint pain.  Skin: Negative for itching and rash.  Neurological: Negative for dizziness, tremors, focal weakness, seizures and loss of consciousness.  Endo/Heme/Allergies: Negative for environmental allergies.  Psychiatric/Behavioral: Negative for depression, suicidal ideas and hallucinations.  All other systems reviewed and are negative.  Physical Exam: Blood pressure 124/76, pulse 69, height 5\' 5"  (1.651 m), weight 151 lb 3.2 oz (68.6 kg), SpO2 98 %. Gen:      No acute distress HEENT:  EOMI, sclera anicteric Neck:     No masses; no thyromegaly Lungs:    Clear to auscultation bilaterally; normal respiratory effort CV:         Regular rate and rhythm; no murmurs Abd:      + bowel sounds; soft, non-tender; no palpable masses, no distension Ext:    No edema; adequate peripheral perfusion Skin:      Warm and dry; no rash Neuro: alert and oriented x 3 Psych: normal mood and affect  Data Reviewed: CT chest 12/21/2017- bilateral peribronchovascular nodularity,  groundglass opacities left greater than right.  Dominant 1.1 cm left upper lobe nodule.  Mild centrilobular, paraseptal emphysema.  Left main and three-vessel coronary atherosclerosis.  Small hiatal hernia.  I have reviewed the images personally  Assessment:  Abnormal CT Recently treated for pneumonia.  CT scan reviewed which showed some nodularity and groundglass opacities.  I suspect this is secondary to pneumonia. However given his smoking history we will need to follow to resolution CT of the chest without contrast in 3 months.  Emphysema CT scan shows mild emphysema Schedule pulmonary function tests for further evaluation.  Plan/Recommendations: - Follow-up CT chest without contrast - Pulmonary function test  Marshell Garfinkel MD [facility] 01/22/2018, 3:07 PM  CC: Monico Blitz, MD

## 2018-01-22 NOTE — Patient Instructions (Signed)
I have reviewed his CT scan which shows some nodules which are likely from pneumonia We will make sure that this clears up.  Will get a follow-up CT without contrast in 3 months We will also order for PFTs in 3 months. Both of these test to be done at [facility] Follow-up in clinic after test.

## 2018-01-23 NOTE — Addendum Note (Signed)
Addended by: Madolyn Frieze on: 01/23/2018 11:38 AM   Modules accepted: Orders

## 2018-01-31 DIAGNOSIS — B351 Tinea unguium: Secondary | ICD-10-CM | POA: Diagnosis not present

## 2018-01-31 DIAGNOSIS — E1140 Type 2 diabetes mellitus with diabetic neuropathy, unspecified: Secondary | ICD-10-CM | POA: Diagnosis not present

## 2018-02-04 DIAGNOSIS — E119 Type 2 diabetes mellitus without complications: Secondary | ICD-10-CM | POA: Diagnosis not present

## 2018-02-04 DIAGNOSIS — Z6822 Body mass index (BMI) 22.0-22.9, adult: Secondary | ICD-10-CM | POA: Diagnosis not present

## 2018-02-04 DIAGNOSIS — L03115 Cellulitis of right lower limb: Secondary | ICD-10-CM | POA: Diagnosis not present

## 2018-02-04 DIAGNOSIS — Z299 Encounter for prophylactic measures, unspecified: Secondary | ICD-10-CM | POA: Diagnosis not present

## 2018-02-04 DIAGNOSIS — I10 Essential (primary) hypertension: Secondary | ICD-10-CM | POA: Diagnosis not present

## 2018-02-04 DIAGNOSIS — Z713 Dietary counseling and surveillance: Secondary | ICD-10-CM | POA: Diagnosis not present

## 2018-02-14 DIAGNOSIS — M545 Low back pain: Secondary | ICD-10-CM | POA: Diagnosis not present

## 2018-02-14 DIAGNOSIS — M130 Polyarthritis, unspecified: Secondary | ICD-10-CM | POA: Diagnosis not present

## 2018-02-14 DIAGNOSIS — M25569 Pain in unspecified knee: Secondary | ICD-10-CM | POA: Diagnosis not present

## 2018-02-14 DIAGNOSIS — M542 Cervicalgia: Secondary | ICD-10-CM | POA: Diagnosis not present

## 2018-03-04 DIAGNOSIS — E119 Type 2 diabetes mellitus without complications: Secondary | ICD-10-CM | POA: Diagnosis not present

## 2018-03-04 DIAGNOSIS — I10 Essential (primary) hypertension: Secondary | ICD-10-CM | POA: Diagnosis not present

## 2018-03-13 DIAGNOSIS — I10 Essential (primary) hypertension: Secondary | ICD-10-CM | POA: Diagnosis not present

## 2018-03-13 DIAGNOSIS — H6090 Unspecified otitis externa, unspecified ear: Secondary | ICD-10-CM | POA: Diagnosis not present

## 2018-03-13 DIAGNOSIS — Z6823 Body mass index (BMI) 23.0-23.9, adult: Secondary | ICD-10-CM | POA: Diagnosis not present

## 2018-03-13 DIAGNOSIS — H6692 Otitis media, unspecified, left ear: Secondary | ICD-10-CM | POA: Diagnosis not present

## 2018-03-13 DIAGNOSIS — J069 Acute upper respiratory infection, unspecified: Secondary | ICD-10-CM | POA: Diagnosis not present

## 2018-03-13 DIAGNOSIS — Z299 Encounter for prophylactic measures, unspecified: Secondary | ICD-10-CM | POA: Diagnosis not present

## 2018-03-19 DIAGNOSIS — L570 Actinic keratosis: Secondary | ICD-10-CM | POA: Diagnosis not present

## 2018-04-05 DIAGNOSIS — I10 Essential (primary) hypertension: Secondary | ICD-10-CM | POA: Diagnosis not present

## 2018-04-05 DIAGNOSIS — E119 Type 2 diabetes mellitus without complications: Secondary | ICD-10-CM | POA: Diagnosis not present

## 2018-04-16 DIAGNOSIS — M545 Low back pain: Secondary | ICD-10-CM | POA: Diagnosis not present

## 2018-04-16 DIAGNOSIS — M130 Polyarthritis, unspecified: Secondary | ICD-10-CM | POA: Diagnosis not present

## 2018-04-16 DIAGNOSIS — M542 Cervicalgia: Secondary | ICD-10-CM | POA: Diagnosis not present

## 2018-04-16 DIAGNOSIS — M25569 Pain in unspecified knee: Secondary | ICD-10-CM | POA: Diagnosis not present

## 2018-04-17 ENCOUNTER — Ambulatory Visit (HOSPITAL_COMMUNITY)
Admission: RE | Admit: 2018-04-17 | Discharge: 2018-04-17 | Disposition: A | Discharge status: Home / Self Care | Payer: Medicare Other | Source: Ambulatory Visit | Attending: Pulmonary Disease | Admitting: Pulmonary Disease

## 2018-04-17 DIAGNOSIS — Z87891 Personal history of nicotine dependence: Secondary | ICD-10-CM | POA: Diagnosis not present

## 2018-04-17 DIAGNOSIS — J439 Emphysema, unspecified: Secondary | ICD-10-CM | POA: Insufficient documentation

## 2018-04-17 LAB — PULMONARY FUNCTION TEST
DL/VA % PRED: 86 %
DL/VA: 3.67 ml/min/mmHg/L
DLCO UNC % PRED: 69 %
DLCO unc: 17.74 ml/min/mmHg
FEF 25-75 POST: 2.47 L/s
FEF 25-75 PRE: 2.62 L/s
FEF2575-%CHANGE-POST: -5 %
FEF2575-%PRED-PRE: 144 %
FEF2575-%Pred-Post: 136 %
FEV1-%Change-Post: -1 %
FEV1-%Pred-Post: 98 %
FEV1-%Pred-Pre: 100 %
FEV1-Post: 2.45 L
FEV1-Pre: 2.49 L
FEV1FVC-%CHANGE-POST: -6 %
FEV1FVC-%Pred-Pre: 112 %
FEV6-%CHANGE-POST: 5 %
FEV6-%Pred-Post: 99 %
FEV6-%Pred-Pre: 94 %
FEV6-PRE: 3.02 L
FEV6-Post: 3.18 L
FEV6FVC-%Change-Post: 0 %
FEV6FVC-%PRED-PRE: 107 %
FEV6FVC-%Pred-Post: 107 %
FVC-%CHANGE-POST: 5 %
FVC-%PRED-POST: 92 %
FVC-%Pred-Pre: 87 %
FVC-Post: 3.2 L
FVC-Pre: 3.02 L
PRE FEV1/FVC RATIO: 82 %
Post FEV1/FVC ratio: 77 %
Post FEV6/FVC ratio: 100 %
Pre FEV6/FVC Ratio: 100 %
RV % pred: 199 %
RV: 4.53 L
TLC % pred: 125 %
TLC: 7.59 L

## 2018-04-17 MED ORDER — ALBUTEROL SULFATE (2.5 MG/3ML) 0.083% IN NEBU
2.5000 mg | INHALATION_SOLUTION | Freq: Once | RESPIRATORY_TRACT | Status: AC
  Administered 2018-04-17: 2.5 mg via RESPIRATORY_TRACT

## 2018-04-22 DIAGNOSIS — Z23 Encounter for immunization: Secondary | ICD-10-CM | POA: Diagnosis not present

## 2018-04-22 DIAGNOSIS — I10 Essential (primary) hypertension: Secondary | ICD-10-CM | POA: Diagnosis not present

## 2018-04-22 DIAGNOSIS — J069 Acute upper respiratory infection, unspecified: Secondary | ICD-10-CM | POA: Diagnosis not present

## 2018-04-22 DIAGNOSIS — E1165 Type 2 diabetes mellitus with hyperglycemia: Secondary | ICD-10-CM | POA: Diagnosis not present

## 2018-04-22 DIAGNOSIS — Z6824 Body mass index (BMI) 24.0-24.9, adult: Secondary | ICD-10-CM | POA: Diagnosis not present

## 2018-04-22 DIAGNOSIS — Z299 Encounter for prophylactic measures, unspecified: Secondary | ICD-10-CM | POA: Diagnosis not present

## 2018-04-23 DIAGNOSIS — T50B95A Adverse effect of other viral vaccines, initial encounter: Secondary | ICD-10-CM | POA: Diagnosis not present

## 2018-04-23 DIAGNOSIS — Z713 Dietary counseling and surveillance: Secondary | ICD-10-CM | POA: Diagnosis not present

## 2018-04-23 DIAGNOSIS — I10 Essential (primary) hypertension: Secondary | ICD-10-CM | POA: Diagnosis not present

## 2018-04-23 DIAGNOSIS — Z299 Encounter for prophylactic measures, unspecified: Secondary | ICD-10-CM | POA: Diagnosis not present

## 2018-04-23 DIAGNOSIS — Z6824 Body mass index (BMI) 24.0-24.9, adult: Secondary | ICD-10-CM | POA: Diagnosis not present

## 2018-04-25 ENCOUNTER — Ambulatory Visit (HOSPITAL_COMMUNITY)
Admission: RE | Admit: 2018-04-25 | Discharge: 2018-04-25 | Disposition: A | Discharge status: Home / Self Care | Payer: Medicare Other | Source: Ambulatory Visit | Attending: Pulmonary Disease | Admitting: Pulmonary Disease

## 2018-04-25 DIAGNOSIS — R918 Other nonspecific abnormal finding of lung field: Secondary | ICD-10-CM

## 2018-04-25 DIAGNOSIS — R911 Solitary pulmonary nodule: Secondary | ICD-10-CM

## 2018-05-07 ENCOUNTER — Encounter: Payer: Self-pay | Admitting: Pulmonary Disease

## 2018-05-07 ENCOUNTER — Ambulatory Visit (INDEPENDENT_AMBULATORY_CARE_PROVIDER_SITE_OTHER): Payer: Medicare Other | Admitting: Pulmonary Disease

## 2018-05-07 VITALS — BP 126/74 | HR 78 | Ht 65.0 in | Wt 160.6 lb

## 2018-05-07 DIAGNOSIS — J449 Chronic obstructive pulmonary disease, unspecified: Secondary | ICD-10-CM | POA: Diagnosis not present

## 2018-05-07 DIAGNOSIS — R911 Solitary pulmonary nodule: Secondary | ICD-10-CM | POA: Diagnosis not present

## 2018-05-07 MED ORDER — FLUTTER DEVI: 0 refills | Status: DC

## 2018-05-07 MED ORDER — AZITHROMYCIN 250 MG PO TABS: ORAL_TABLET | ORAL | 0 refills | Status: AC

## 2018-05-07 NOTE — Patient Instructions (Addendum)
Your CT scan from this month shows improvement in pneumonia  PFTs do not show any clear evidence of COPD which is good news We will get a follow-up CT chest without contrast in 1 year for monitoring  Go prescription for Z-Pak We will start flutter valve for clearance of secretion Stay well-hydrated and use Mucinex I will check back with you in 1 year after CT scan.

## 2018-05-07 NOTE — Progress Notes (Signed)
Travis Beck    [id number]    06/22/1946  Primary Care Physician:Patient, No Pcp Per  Referring Physician: Glenda Chroman, MD [place], [place] [id number]  Chief complaint: Follow up for emphysema, chronic bronchitis, abnormal CT scan  HPI: 72 year old with history of allergic rhinitis, hyperlipidemia, type 2 diabetes, former smoker Treated for pneumonia in May-June 2019.  He required 3 rounds of antibiotic to finally get symptoms under control.  He had imaging including CT of the chest which showed bilateral infiltrates, nodular opacities and has been referred here for further evaluation  States that at present his breathing is back to baseline he has mild dyspnea with activity, cough with clear mucus.  Denies any fevers, chills.  Pets: Has a dog, no cats, birds, farm animals Occupation: Used to work from [employer].  Currently retired Exposures: No known exposures, no leak, hot tub, Jacuzzi, mold Smoking history: 35-pack-year smoker.  Quit in 1994 Travel history: No significant travel.  Interim history: He is here for review of his CT and pulmonary function test. Continues to have chronic cough with yellow to white mucus, chest congestion.  Mild dyspnea with activity and at rest, no wheezing, fevers, chills.  Outpatient Encounter Medications as of 05/07/2018  Medication Sig  . ALPRAZolam (XANAX) 1 MG tablet Take by mouth.  Marland Kitchen amLODipine (NORVASC) 10 MG tablet Take 10 mg by mouth daily.  . buprenorphine (SUBUTEX) 2 MG SUBL SL tablet 4 mg daily.  . cyanocobalamin (,VITAMIN B-12,) 1000 MCG/ML injection INJECT 1ML INTO THE SKIN ONCE A MONTH  . diclofenac (VOLTAREN) 75 MG EC tablet Take by mouth.  . fluticasone (FLONASE) 50 MCG/ACT nasal spray INSTILL 2 SPRAYS IN EACH NOSTRIL EVERY DAY  . gabapentin (NEURONTIN) 400 MG capsule Take 400 mg by mouth 3 (three) times daily.  Marland Kitchen glyBURIDE (DIABETA) 5 MG tablet TAKE TWO TABLETS BY MOUTH TWICE DAILY EMERGENCY REFILL  FAXED DR.  . hydrALAZINE (APRESOLINE) 25 MG tablet Take by mouth.  Marland Kitchen ibuprofen (ADVIL,MOTRIN) 800 MG tablet Take by mouth.  . meclizine (ANTIVERT) 25 MG tablet TAKE 1 TABLET BY MOUTH EVERY 8 HOURS AS NEEDED DIZZINESS  . metFORMIN (GLUCOPHAGE) 1000 MG tablet Take by mouth.  Marland Kitchen omeprazole (PRILOSEC) 20 MG capsule Take by mouth.  . ondansetron (ZOFRAN) 4 MG tablet Take 4 mg by mouth every 8 (eight) hours as needed. for nausea  . rOPINIRole (REQUIP) 2 MG tablet Take 2 mg by mouth at bedtime.  . sitaGLIPtin (JANUVIA) 100 MG tablet Take by mouth.  . triamcinolone cream (KENALOG) 0.1 % APPLY TO THE AFFECTED AREA(S) EVERY 8 HOURS AS NEEDED  . VENTOLIN HFA 108 (90 Base) MCG/ACT inhaler inhale ONE TO TWO puffs EVERY 4 TO 6 HOURS AS NEEDED   No facility-administered encounter medications on file as of 05/07/2018.    Physical Exam: Blood pressure 126/74, pulse 78, height 5\' 5"  (1.651 m), weight 160 lb 9.6 oz (72.8 kg), SpO2 97 %. Gen:      No acute distress HEENT:  EOMI, sclera anicteric Neck:     No masses; no thyromegaly Lungs:    Clear to auscultation bilaterally; normal respiratory effort CV:         Regular rate and rhythm; no murmurs Abd:      + bowel sounds; soft, non-tender; no palpable masses, no distension Ext:    No edema; adequate peripheral perfusion Skin:      Warm and dry; no rash Neuro: alert  and oriented x 3 Psych: normal mood and affect  Data Reviewed: Imaging CT chest 12/21/2017- bilateral peribronchovascular nodularity, groundglass opacities left greater than right.  Dominant 1.1 cm left upper lobe nodule.  Mild centrilobular, paraseptal emphysema.  Left main and three-vessel coronary atherosclerosis.  Small hiatal hernia.    CT chest 04/27/2018- improvement in left upper lobe patchy airspace opacities, mild tree-in-bud in the upper lobes and lower lobes.  Peripheral subcentimeter pulmonary nodules.,  Aortic atherosclerosis I reviewed the images  personally.  PFTs 04/17/2018 FVC 3.20 [92%), FEV1 2.45 [98%), F/F 77, TLC 125%, RV/DL 361%, DLCO 69% No obstruction, air trapping Mild reduction diffusion capacity.   Assessment:  Emphysema, chronic bronchitis CT reviewed with improvement in recent pneumonia PFTs did not show any significant obstruction or evidence of COPD Encouraged him to stay well-hydrated, use Mucinex and flutter valve for clearance of secretions We will give him a Z-Pak to be used in reserve in case of recurrent bronchitis.  Centimeter pulmonary nodule Follow-up CT in 1 year.  Plan/Recommendations: - Mucinex, flutter valve - Follow-up CT chest without contrast in 1 year   Marshell Garfinkel MD [facility] 05/07/2018, 9:39 AM  CC: Glenda Chroman, MD

## 2018-05-23 DIAGNOSIS — I10 Essential (primary) hypertension: Secondary | ICD-10-CM | POA: Diagnosis not present

## 2018-05-23 DIAGNOSIS — E119 Type 2 diabetes mellitus without complications: Secondary | ICD-10-CM | POA: Diagnosis not present

## 2018-05-29 DIAGNOSIS — Z1331 Encounter for screening for depression: Secondary | ICD-10-CM | POA: Diagnosis not present

## 2018-05-29 DIAGNOSIS — E1165 Type 2 diabetes mellitus with hyperglycemia: Secondary | ICD-10-CM | POA: Diagnosis not present

## 2018-05-29 DIAGNOSIS — Z1211 Encounter for screening for malignant neoplasm of colon: Secondary | ICD-10-CM | POA: Diagnosis not present

## 2018-05-29 DIAGNOSIS — Z6826 Body mass index (BMI) 26.0-26.9, adult: Secondary | ICD-10-CM | POA: Diagnosis not present

## 2018-05-29 DIAGNOSIS — Z1339 Encounter for screening examination for other mental health and behavioral disorders: Secondary | ICD-10-CM | POA: Diagnosis not present

## 2018-05-29 DIAGNOSIS — Z0000 Encounter for general adult medical examination without abnormal findings: Secondary | ICD-10-CM | POA: Diagnosis not present

## 2018-05-29 DIAGNOSIS — Z125 Encounter for screening for malignant neoplasm of prostate: Secondary | ICD-10-CM | POA: Diagnosis not present

## 2018-05-29 DIAGNOSIS — E785 Hyperlipidemia, unspecified: Secondary | ICD-10-CM | POA: Diagnosis not present

## 2018-05-29 DIAGNOSIS — R5383 Other fatigue: Secondary | ICD-10-CM | POA: Diagnosis not present

## 2018-05-29 DIAGNOSIS — Z79899 Other long term (current) drug therapy: Secondary | ICD-10-CM | POA: Diagnosis not present

## 2018-05-29 DIAGNOSIS — Z299 Encounter for prophylactic measures, unspecified: Secondary | ICD-10-CM | POA: Diagnosis not present

## 2018-05-29 DIAGNOSIS — Z7189 Other specified counseling: Secondary | ICD-10-CM | POA: Diagnosis not present

## 2018-06-11 DIAGNOSIS — M25569 Pain in unspecified knee: Secondary | ICD-10-CM | POA: Diagnosis not present

## 2018-06-11 DIAGNOSIS — M545 Low back pain: Secondary | ICD-10-CM | POA: Diagnosis not present

## 2018-06-11 DIAGNOSIS — M130 Polyarthritis, unspecified: Secondary | ICD-10-CM | POA: Diagnosis not present

## 2018-06-11 DIAGNOSIS — M542 Cervicalgia: Secondary | ICD-10-CM | POA: Diagnosis not present

## 2018-06-17 DIAGNOSIS — I10 Essential (primary) hypertension: Secondary | ICD-10-CM | POA: Diagnosis not present

## 2018-06-17 DIAGNOSIS — Z87891 Personal history of nicotine dependence: Secondary | ICD-10-CM | POA: Diagnosis not present

## 2018-06-17 DIAGNOSIS — Z299 Encounter for prophylactic measures, unspecified: Secondary | ICD-10-CM | POA: Diagnosis not present

## 2018-06-17 DIAGNOSIS — J069 Acute upper respiratory infection, unspecified: Secondary | ICD-10-CM | POA: Diagnosis not present

## 2018-06-17 DIAGNOSIS — Z6826 Body mass index (BMI) 26.0-26.9, adult: Secondary | ICD-10-CM | POA: Diagnosis not present

## 2018-06-19 DIAGNOSIS — E119 Type 2 diabetes mellitus without complications: Secondary | ICD-10-CM | POA: Diagnosis not present

## 2018-06-19 DIAGNOSIS — I10 Essential (primary) hypertension: Secondary | ICD-10-CM | POA: Diagnosis not present

## 2018-07-07 DIAGNOSIS — E871 Hypo-osmolality and hyponatremia: Secondary | ICD-10-CM | POA: Diagnosis not present

## 2018-07-07 DIAGNOSIS — R42 Dizziness and giddiness: Secondary | ICD-10-CM | POA: Diagnosis not present

## 2018-07-07 DIAGNOSIS — D638 Anemia in other chronic diseases classified elsewhere: Secondary | ICD-10-CM | POA: Diagnosis not present

## 2018-07-07 DIAGNOSIS — R112 Nausea with vomiting, unspecified: Secondary | ICD-10-CM | POA: Diagnosis not present

## 2018-07-07 DIAGNOSIS — I10 Essential (primary) hypertension: Secondary | ICD-10-CM | POA: Diagnosis not present

## 2018-07-07 DIAGNOSIS — R0902 Hypoxemia: Secondary | ICD-10-CM | POA: Diagnosis not present

## 2018-07-07 DIAGNOSIS — Z87891 Personal history of nicotine dependence: Secondary | ICD-10-CM | POA: Diagnosis not present

## 2018-07-07 DIAGNOSIS — R05 Cough: Secondary | ICD-10-CM | POA: Diagnosis not present

## 2018-07-07 DIAGNOSIS — D649 Anemia, unspecified: Secondary | ICD-10-CM | POA: Diagnosis not present

## 2018-07-07 DIAGNOSIS — E1165 Type 2 diabetes mellitus with hyperglycemia: Secondary | ICD-10-CM | POA: Diagnosis not present

## 2018-07-07 DIAGNOSIS — M542 Cervicalgia: Secondary | ICD-10-CM | POA: Diagnosis not present

## 2018-07-07 DIAGNOSIS — Z880 Allergy status to penicillin: Secondary | ICD-10-CM | POA: Diagnosis not present

## 2018-07-07 DIAGNOSIS — J189 Pneumonia, unspecified organism: Secondary | ICD-10-CM | POA: Diagnosis not present

## 2018-07-07 DIAGNOSIS — E785 Hyperlipidemia, unspecified: Secondary | ICD-10-CM | POA: Diagnosis not present

## 2018-07-07 DIAGNOSIS — R52 Pain, unspecified: Secondary | ICD-10-CM | POA: Diagnosis not present

## 2018-07-16 DIAGNOSIS — E1140 Type 2 diabetes mellitus with diabetic neuropathy, unspecified: Secondary | ICD-10-CM | POA: Diagnosis not present

## 2018-07-16 DIAGNOSIS — H81393 Other peripheral vertigo, bilateral: Secondary | ICD-10-CM | POA: Diagnosis not present

## 2018-07-16 DIAGNOSIS — E1159 Type 2 diabetes mellitus with other circulatory complications: Secondary | ICD-10-CM | POA: Diagnosis not present

## 2018-07-18 DIAGNOSIS — Z299 Encounter for prophylactic measures, unspecified: Secondary | ICD-10-CM | POA: Diagnosis not present

## 2018-07-18 DIAGNOSIS — M791 Myalgia, unspecified site: Secondary | ICD-10-CM | POA: Diagnosis not present

## 2018-07-18 DIAGNOSIS — I10 Essential (primary) hypertension: Secondary | ICD-10-CM | POA: Diagnosis not present

## 2018-07-18 DIAGNOSIS — J449 Chronic obstructive pulmonary disease, unspecified: Secondary | ICD-10-CM | POA: Diagnosis not present

## 2018-07-18 DIAGNOSIS — Z87891 Personal history of nicotine dependence: Secondary | ICD-10-CM | POA: Diagnosis not present

## 2018-07-18 DIAGNOSIS — M79606 Pain in leg, unspecified: Secondary | ICD-10-CM | POA: Diagnosis not present

## 2018-07-30 ENCOUNTER — Encounter: Payer: Self-pay | Admitting: Cardiovascular Disease

## 2018-07-30 DIAGNOSIS — E1165 Type 2 diabetes mellitus with hyperglycemia: Secondary | ICD-10-CM | POA: Diagnosis not present

## 2018-07-30 DIAGNOSIS — I10 Essential (primary) hypertension: Secondary | ICD-10-CM | POA: Diagnosis not present

## 2018-07-30 DIAGNOSIS — R079 Chest pain, unspecified: Secondary | ICD-10-CM | POA: Diagnosis not present

## 2018-07-30 DIAGNOSIS — Z6825 Body mass index (BMI) 25.0-25.9, adult: Secondary | ICD-10-CM | POA: Diagnosis not present

## 2018-07-30 DIAGNOSIS — J449 Chronic obstructive pulmonary disease, unspecified: Secondary | ICD-10-CM | POA: Diagnosis not present

## 2018-07-30 DIAGNOSIS — Z299 Encounter for prophylactic measures, unspecified: Secondary | ICD-10-CM | POA: Diagnosis not present

## 2018-08-05 DIAGNOSIS — M542 Cervicalgia: Secondary | ICD-10-CM | POA: Diagnosis not present

## 2018-08-05 DIAGNOSIS — M545 Low back pain: Secondary | ICD-10-CM | POA: Diagnosis not present

## 2018-08-05 DIAGNOSIS — M130 Polyarthritis, unspecified: Secondary | ICD-10-CM | POA: Diagnosis not present

## 2018-08-05 DIAGNOSIS — M25569 Pain in unspecified knee: Secondary | ICD-10-CM | POA: Diagnosis not present

## 2018-08-06 DIAGNOSIS — I10 Essential (primary) hypertension: Secondary | ICD-10-CM | POA: Diagnosis not present

## 2018-08-06 DIAGNOSIS — E119 Type 2 diabetes mellitus without complications: Secondary | ICD-10-CM | POA: Diagnosis not present

## 2018-08-07 ENCOUNTER — Ambulatory Visit (INDEPENDENT_AMBULATORY_CARE_PROVIDER_SITE_OTHER): Payer: Medicare Other | Admitting: Internal Medicine

## 2018-08-12 DIAGNOSIS — R079 Chest pain, unspecified: Secondary | ICD-10-CM | POA: Diagnosis not present

## 2018-08-13 ENCOUNTER — Ambulatory Visit (INDEPENDENT_AMBULATORY_CARE_PROVIDER_SITE_OTHER): Payer: Medicare Other | Admitting: Internal Medicine

## 2018-08-20 ENCOUNTER — Encounter (INDEPENDENT_AMBULATORY_CARE_PROVIDER_SITE_OTHER): Payer: Self-pay | Admitting: *Deleted

## 2018-08-20 ENCOUNTER — Ambulatory Visit (INDEPENDENT_AMBULATORY_CARE_PROVIDER_SITE_OTHER): Payer: Medicare Other | Admitting: Internal Medicine

## 2018-08-20 ENCOUNTER — Other Ambulatory Visit (INDEPENDENT_AMBULATORY_CARE_PROVIDER_SITE_OTHER): Payer: Self-pay | Admitting: Internal Medicine

## 2018-08-20 ENCOUNTER — Encounter (INDEPENDENT_AMBULATORY_CARE_PROVIDER_SITE_OTHER): Payer: Self-pay | Admitting: Internal Medicine

## 2018-08-20 VITALS — BP 130/58 | HR 72 | Temp 97.5°F | Ht 65.0 in | Wt 157.4 lb

## 2018-08-20 DIAGNOSIS — K921 Melena: Secondary | ICD-10-CM | POA: Insufficient documentation

## 2018-08-20 DIAGNOSIS — K920 Hematemesis: Secondary | ICD-10-CM | POA: Diagnosis not present

## 2018-08-20 LAB — HEMOGLOBIN AND HEMATOCRIT, BLOOD
HCT: 31.8 % — ABNORMAL LOW (ref 38.5–50.0)
Hemoglobin: 10.1 g/dL — ABNORMAL LOW (ref 13.2–17.1)

## 2018-08-20 MED ORDER — PANTOPRAZOLE SODIUM 40 MG PO TBEC: 40.0000 mg | DELAYED_RELEASE_TABLET | Freq: Two times a day (BID) | ORAL | 3 refills | Status: DC

## 2018-08-20 NOTE — Patient Instructions (Signed)
The risks of bleeding, perforation and infection were reviewed with patient.  

## 2018-08-20 NOTE — Progress Notes (Signed)
Subjective:    Patient ID: Travis Beck, male    DOB: 1946-02-07, 73 y.o.   MRN: [id number]  HPI Referred by Dr. Manuella Ghazi for abdominal pain, vomiting blood. Has been vomiting black vomitus. Has had 2 episodes of vomiting blood. Last episode was about 4 weeks ago.  He states foods feel like they are lodging.   Hotdogs also bother him. Can water without any problem.  Appetite is okay. In the morning, he doesn't have much of an appetite.  He denies any abdominal pain. His BMs move okay. Has a BM  1-2 a week. No melena or BRRB. Sister states he had a black stool about a month ago.     Hx of diabetes, arthritis, COPD Rarely take Motrin.    Worked at [employer]. One child.    Review of Systems History reviewed. No pertinent past medical history.  History reviewed. No pertinent surgical history.  Allergies  Allergen Reactions  . Penicillins Rash    Current Outpatient Medications on File Prior to Visit  Medication Sig Dispense Refill  . ALPRAZolam (XANAX) 1 MG tablet Take by mouth.    . ALPRAZolam (XANAX) 1 MG tablet Take 1 mg by mouth 2 (two) times daily.    Marland Kitchen amLODipine (NORVASC) 10 MG tablet Take 10 mg by mouth daily.  1  . buprenorphine (SUBUTEX) 2 MG SUBL SL tablet 4 mg as needed.   1  . fluticasone (FLONASE) 50 MCG/ACT nasal spray INSTILL 2 SPRAYS IN EACH NOSTRIL EVERY DAY    . gabapentin (NEURONTIN) 400 MG capsule Take 400 mg by mouth 3 (three) times daily.  1  . glyBURIDE (DIABETA) 5 MG tablet TAKE TWO TABLETS BY MOUTH TWICE DAILY EMERGENCY REFILL FAXED DR.    . hydrALAZINE (APRESOLINE) 25 MG tablet Take by mouth.    Marland Kitchen ibuprofen (ADVIL,MOTRIN) 800 MG tablet Take by mouth.    Marland Kitchen ibuprofen (ADVIL,MOTRIN) 800 MG tablet Take 800 mg by mouth every 8 (eight) hours as needed.    . metFORMIN (GLUCOPHAGE) 1000 MG tablet Take by mouth 2 (two) times daily with a meal.     . pantoprazole (PROTONIX) 40 MG tablet Take 40 mg by mouth daily.    Marland Kitchen rOPINIRole (REQUIP) 2 MG tablet Take 2 mg  by mouth at bedtime.  2  . rOPINIRole (REQUIP) 2 MG tablet Take 2 mg by mouth at bedtime.    . sitaGLIPtin (JANUVIA) 100 MG tablet Take 100 mg by mouth daily.     . traZODone (DESYREL) 150 MG tablet Take by mouth at bedtime.    . traZODone (DESYREL) 150 MG tablet Take by mouth at bedtime.     No current facility-administered medications on file prior to visit.         Objective:   Physical Exam Blood pressure (!) 130/58, pulse 72, temperature (!) 97.5 F (36.4 C), height 5\' 5"  (1.651 m), weight 157 lb 6.4 oz (71.4 kg). The risks of bleeding, perforation and infection were reviewed with patient.  Alert and oriented. Skin warm and dry. Oral mucosa is moist.   . Sclera anicteric, conjunctivae is pink. Thyroid not enlarged. No cervical lymphadenopathy. Lungs clear. Heart regular rate and rhythm.  Abdomen is soft. Bowel sounds are positive. No hepatomegaly. No abdominal masses felt. No tenderness.  No edema to lower extremities.   Rectal: no masses, guaiac negative.         Assessment & Plan:  Hematemesis, melena. Patient needs EGD/ED with propofol.  The risks of  bleeding, perforation and infection were reviewed with patient. Rx for Protonix 40mg  BID sent to his pharmacy.

## 2018-08-21 ENCOUNTER — Encounter: Payer: Self-pay | Admitting: *Deleted

## 2018-08-22 ENCOUNTER — Encounter: Payer: Self-pay | Admitting: *Deleted

## 2018-08-22 ENCOUNTER — Telehealth: Payer: Self-pay | Admitting: Cardiovascular Disease

## 2018-08-22 ENCOUNTER — Encounter: Payer: Self-pay | Admitting: Cardiovascular Disease

## 2018-08-22 ENCOUNTER — Ambulatory Visit (INDEPENDENT_AMBULATORY_CARE_PROVIDER_SITE_OTHER): Payer: Medicare Other | Admitting: Cardiovascular Disease

## 2018-08-22 VITALS — BP 123/74 | HR 74 | Ht 65.0 in | Wt 158.0 lb

## 2018-08-22 DIAGNOSIS — I10 Essential (primary) hypertension: Secondary | ICD-10-CM | POA: Diagnosis not present

## 2018-08-22 DIAGNOSIS — R079 Chest pain, unspecified: Secondary | ICD-10-CM

## 2018-08-22 DIAGNOSIS — E119 Type 2 diabetes mellitus without complications: Secondary | ICD-10-CM | POA: Diagnosis not present

## 2018-08-22 DIAGNOSIS — R9431 Abnormal electrocardiogram [ECG] [EKG]: Secondary | ICD-10-CM | POA: Diagnosis not present

## 2018-08-22 NOTE — Patient Instructions (Signed)
Medication Instructions:  Continue all current medications.  Labwork:   Testing/Procedures:  Your physician has requested that you have an exercise stress myoview. For further information please visit [email or web address]. Please follow instruction sheet, as given.  Office will contact with results via phone or letter.    Follow-Up: 3 months   Any Other Special Instructions Will Be Listed Below (If Applicable).  If you need a refill on your cardiac medications before your next appointment, please call your pharmacy.

## 2018-08-22 NOTE — Telephone Encounter (Signed)
Pre-cert Verification for the following procedure    Stress test scheduled 09/03/2018 at [facility]

## 2018-08-22 NOTE — Progress Notes (Signed)
CARDIOLOGY CONSULT NOTE  Patient ID: Travis Beck MRN: [id number] DOB/AGE: 1946/01/25 73 y.o.  Admit date: (Not on file) Primary Physician: Monico Blitz, MD Referring Physician: Monico Blitz, MD  Reason for Consultation: Chest pain  HPI: Travis Beck is a 73 y.o. male who is being seen today for the evaluation of chest pain at the request of Monico Blitz, MD.   Past medical history includes hyperlipidemia, type 2 diabetes mellitus, emphysema and a prior history of tobacco use.  I personally reviewed an ECG performed on 07/30/2018 which showed sinus rhythm with possible old inferior infarct.  Only available labs include hemoglobin of 10.1 on 08/20/2018.  He is being scheduled to undergo esophagogastroduodenoscopy.  He has been experiencing chest pains while lying down in bed on the left side of his chest.  He denies exertional chest pain.  He has chronic exertional dyspnea due to COPD.  He denies palpitations.  He had been experiencing some lightheadedness and dizziness but this has resolved since dose adjustment of blood pressure medications.  He denies leg swelling and syncope.  The other day he tried eating an apple and choked.  He denies chronic dysphagia.  Chest CT on 04/26/2018 demonstrated aortic atherosclerosis.  He said he underwent what sounds like an echocardiogram at [facility] recently.  Family history: Father died of heart disease at age 67.   Allergies  Allergen Reactions  . Penicillins Rash    Current Outpatient Medications  Medication Sig Dispense Refill  . ALPRAZolam (XANAX) 1 MG tablet Take 1 mg by mouth 3 (three) times daily.     Marland Kitchen amLODipine (NORVASC) 10 MG tablet Take 10 mg by mouth daily.  1  . buprenorphine (SUBUTEX) 2 MG SUBL SL tablet 4 mg as needed.   1  . Cyanocobalamin (B-12) 1000 MCG SUBL Place 1 mL under the tongue every 30 (thirty) days.    Marland Kitchen gabapentin (NEURONTIN) 400 MG capsule Take 400 mg by mouth 3 (three)  times daily.  1  . glyBURIDE (DIABETA) 5 MG tablet TAKE TWO TABLETS BY MOUTH TWICE DAILY EMERGENCY REFILL FAXED DR.    Marland Kitchen ibuprofen (ADVIL,MOTRIN) 800 MG tablet Take 800 mg by mouth 2 (two) times daily as needed.     . metFORMIN (GLUCOPHAGE) 1000 MG tablet Take by mouth 2 (two) times daily with a meal.     . pantoprazole (PROTONIX) 40 MG tablet Take 1 tablet (40 mg total) by mouth 2 (two) times daily before a meal. (Patient taking differently: Take 40 mg by mouth daily. ) 180 tablet 3  . pantoprazole (PROTONIX) 40 MG tablet Take 40 mg by mouth daily.    . pravastatin (PRAVACHOL) 20 MG tablet Take 1 tablet by mouth daily.    . sitaGLIPtin (JANUVIA) 100 MG tablet Take 100 mg by mouth daily.      No current facility-administered medications for this visit.     Past Medical History:  Diagnosis Date  . Arthritis   . Diabetes [facility])     Past Surgical History:  Procedure Laterality Date  . HERNIA REPAIR     bilateral lower abdomen    Social History   Socioeconomic History  . Marital status: Married    Spouse name: Not on file  . Number of children: Not on file  . Years of education: Not on file  . Highest education level: Not on file  Occupational History  . Not on file  Social Needs  . Emergency planning/management officer  strain: Not on file  . Food insecurity:    Worry: Not on file    Inability: Not on file  . Transportation needs:    Medical: Not on file    Non-medical: Not on file  Tobacco Use  . Smoking status: Former Smoker    Packs/day: 2.00    Years: 33.00    Pack years: 66.00    Types: Cigarettes    Last attempt to quit: 01/22/1993    Years since quitting: 25.5  . Smokeless tobacco: Never Used  Substance and Sexual Activity  . Alcohol use: Not on file  . Drug use: Not on file  . Sexual activity: Not on file  Lifestyle  . Physical activity:    Days per week: Not on file    Minutes per session: Not on file  . Stress: Not on file  Relationships  . Social connections:    Talks  on phone: Not on file    Gets together: Not on file    Attends religious service: Not on file    Active member of club or organization: Not on file    Attends meetings of clubs or organizations: Not on file    Relationship status: Not on file  . Intimate partner violence:    Fear of current or ex partner: Not on file    Emotionally abused: Not on file    Physically abused: Not on file    Forced sexual activity: Not on file  Other Topics Concern  . Not on file  Social History Narrative  . Not on file      Current Meds  Medication Sig  . ALPRAZolam (XANAX) 1 MG tablet Take 1 mg by mouth 3 (three) times daily.   Marland Kitchen amLODipine (NORVASC) 10 MG tablet Take 10 mg by mouth daily.  . buprenorphine (SUBUTEX) 2 MG SUBL SL tablet 4 mg as needed.   . Cyanocobalamin (B-12) 1000 MCG SUBL Place 1 mL under the tongue every 30 (thirty) days.  Marland Kitchen gabapentin (NEURONTIN) 400 MG capsule Take 400 mg by mouth 3 (three) times daily.  Marland Kitchen glyBURIDE (DIABETA) 5 MG tablet TAKE TWO TABLETS BY MOUTH TWICE DAILY EMERGENCY REFILL FAXED DR.  Marland Kitchen ibuprofen (ADVIL,MOTRIN) 800 MG tablet Take 800 mg by mouth 2 (two) times daily as needed.   . metFORMIN (GLUCOPHAGE) 1000 MG tablet Take by mouth 2 (two) times daily with a meal.   . pantoprazole (PROTONIX) 40 MG tablet Take 1 tablet (40 mg total) by mouth 2 (two) times daily before a meal. (Patient taking differently: Take 40 mg by mouth daily. )  . pantoprazole (PROTONIX) 40 MG tablet Take 40 mg by mouth daily.  . pravastatin (PRAVACHOL) 20 MG tablet Take 1 tablet by mouth daily.  . sitaGLIPtin (JANUVIA) 100 MG tablet Take 100 mg by mouth daily.       Review of systems complete and found to be negative unless listed above in HPI    Physical exam Blood pressure 123/74, pulse 74, height 5\' 5"  (1.651 m), weight 158 lb (71.7 kg), SpO2 98 %. General: NAD Neck: No JVD, no thyromegaly or thyroid nodule.  Lungs: Diminished breath sounds without crackles or wheezes. CV:  Nondisplaced PMI. Regular rate and rhythm, normal S1/S2, no S3/S4, no murmur.  No peripheral edema.  No carotid bruit.    Abdomen: Soft, nontender, no distention.  Skin: Intact without lesions or rashes.  Neurologic: Alert and oriented x 3.  Psych: Normal affect. Extremities: No clubbing or cyanosis.  HEENT: Normal.   ECG: Most recent ECG reviewed.   Labs: Lab Results  Component Value Date/Time   HGB 10.1 (L) 08/20/2018 10:04 AM     Lipids: No results found for: LDLCALC, LDLDIRECT, CHOL, TRIG, HDL      ASSESSMENT AND PLAN:  1.  Chest pain: He has multiple cardiovascular risk factors.  Symptoms however appear atypical for a cardiac etiology.  ECG is abnormal and aortic atherosclerosis was noted on his chest CT from October 2019.  I will proceed with an exercise Myoview for further clarification.  If he is unable to walk for any significant length of time, this may need to be converted to a Lexiscan. I will also obtain a copy of what sounds like an echocardiogram which was recently performed at [facility].  2.  Hypertension: Blood pressure is normal on amlodipine.  No changes to therapy.  3.  Type 2 diabetes mellitus: Currently on glyburide, metformin, and Januvia.  He is also on statin therapy.     Disposition: Follow up in 3 months  Signed: Kate Sable, M.D., F.A.C.C.  08/22/2018, 9:24 AM

## 2018-08-26 ENCOUNTER — Encounter (HOSPITAL_COMMUNITY): Payer: Self-pay

## 2018-08-26 ENCOUNTER — Encounter (HOSPITAL_COMMUNITY)
Admission: RE | Admit: 2018-08-26 | Discharge: 2018-08-26 | Disposition: A | Discharge status: Home / Self Care | Payer: Medicare Other | Source: Ambulatory Visit | Attending: Internal Medicine | Admitting: Internal Medicine

## 2018-08-26 DIAGNOSIS — Z01812 Encounter for preprocedural laboratory examination: Secondary | ICD-10-CM | POA: Insufficient documentation

## 2018-08-26 LAB — BASIC METABOLIC PANEL
Anion gap: 11 (ref 5–15)
BUN: 9 mg/dL (ref 8–23)
CO2: 23 mmol/L (ref 22–32)
CREATININE: 0.81 mg/dL (ref 0.61–1.24)
Calcium: 9 mg/dL (ref 8.9–10.3)
Chloride: 102 mmol/L (ref 98–111)
GFR calc Af Amer: >60 mL/min (ref >60)
GFR calc non Af Amer: >60 mL/min (ref >60)
Glucose, Bld: 203 mg/dL — ABNORMAL HIGH (ref 70–99)
Potassium: 3.7 mmol/L (ref 3.5–5.1)
Sodium: 136 mmol/L (ref 135–145)

## 2018-08-27 DIAGNOSIS — L280 Lichen simplex chronicus: Secondary | ICD-10-CM | POA: Diagnosis not present

## 2018-08-27 DIAGNOSIS — L819 Disorder of pigmentation, unspecified: Secondary | ICD-10-CM | POA: Diagnosis not present

## 2018-08-27 DIAGNOSIS — L570 Actinic keratosis: Secondary | ICD-10-CM | POA: Diagnosis not present

## 2018-08-30 ENCOUNTER — Encounter (HOSPITAL_COMMUNITY)
Admission: RE | Disposition: A | Discharge status: Home / Self Care | Payer: Self-pay | Source: Home / Self Care | Attending: Internal Medicine

## 2018-08-30 ENCOUNTER — Encounter (HOSPITAL_COMMUNITY): Payer: Self-pay | Admitting: Anesthesiology

## 2018-08-30 ENCOUNTER — Ambulatory Visit (HOSPITAL_COMMUNITY): Payer: Medicare Other | Admitting: Anesthesiology

## 2018-08-30 ENCOUNTER — Ambulatory Visit (HOSPITAL_COMMUNITY)
Admission: RE | Admit: 2018-08-30 | Discharge: 2018-08-30 | Disposition: A | Discharge status: Home / Self Care | Payer: Medicare Other | Attending: Internal Medicine | Admitting: Internal Medicine

## 2018-08-30 DIAGNOSIS — J449 Chronic obstructive pulmonary disease, unspecified: Secondary | ICD-10-CM | POA: Diagnosis not present

## 2018-08-30 DIAGNOSIS — K449 Diaphragmatic hernia without obstruction or gangrene: Secondary | ICD-10-CM | POA: Diagnosis not present

## 2018-08-30 DIAGNOSIS — K921 Melena: Secondary | ICD-10-CM

## 2018-08-30 DIAGNOSIS — E119 Type 2 diabetes mellitus without complications: Secondary | ICD-10-CM | POA: Insufficient documentation

## 2018-08-30 DIAGNOSIS — Z7984 Long term (current) use of oral hypoglycemic drugs: Secondary | ICD-10-CM | POA: Diagnosis not present

## 2018-08-30 DIAGNOSIS — K2950 Unspecified chronic gastritis without bleeding: Secondary | ICD-10-CM | POA: Diagnosis not present

## 2018-08-30 DIAGNOSIS — Z79899 Other long term (current) drug therapy: Secondary | ICD-10-CM | POA: Diagnosis not present

## 2018-08-30 DIAGNOSIS — Z7951 Long term (current) use of inhaled steroids: Secondary | ICD-10-CM | POA: Insufficient documentation

## 2018-08-30 DIAGNOSIS — Z87891 Personal history of nicotine dependence: Secondary | ICD-10-CM | POA: Insufficient documentation

## 2018-08-30 DIAGNOSIS — K2970 Gastritis, unspecified, without bleeding: Secondary | ICD-10-CM | POA: Diagnosis not present

## 2018-08-30 DIAGNOSIS — R1314 Dysphagia, pharyngoesophageal phase: Secondary | ICD-10-CM | POA: Insufficient documentation

## 2018-08-30 DIAGNOSIS — K920 Hematemesis: Secondary | ICD-10-CM

## 2018-08-30 DIAGNOSIS — K222 Esophageal obstruction: Secondary | ICD-10-CM | POA: Insufficient documentation

## 2018-08-30 DIAGNOSIS — K210 Gastro-esophageal reflux disease with esophagitis: Secondary | ICD-10-CM | POA: Insufficient documentation

## 2018-08-30 DIAGNOSIS — D649 Anemia, unspecified: Secondary | ICD-10-CM | POA: Insufficient documentation

## 2018-08-30 HISTORY — PX: ESOPHAGOGASTRODUODENOSCOPY (EGD) WITH PROPOFOL: SHX5813

## 2018-08-30 HISTORY — PX: BIOPSY: SHX5522

## 2018-08-30 LAB — HEMOGLOBIN AND HEMATOCRIT, BLOOD
HCT: 29.4 % — ABNORMAL LOW (ref 39.0–52.0)
Hemoglobin: 8.8 g/dL — ABNORMAL LOW (ref 13.0–17.0)

## 2018-08-30 LAB — GLUCOSE, CAPILLARY
Glucose-Capillary: 86 mg/dL (ref 70–99)
Glucose-Capillary: 97 mg/dL (ref 70–99)

## 2018-08-30 SURGERY — ESOPHAGOGASTRODUODENOSCOPY (EGD) WITH PROPOFOL
Anesthesia: General

## 2018-08-30 MED ORDER — PROPOFOL 500 MG/50ML IV EMUL
INTRAVENOUS | Status: DC | PRN
  Administered 2018-08-30: 150 ug/kg/min via INTRAVENOUS

## 2018-08-30 MED ORDER — HYDROMORPHONE HCL 1 MG/ML IJ SOLN: 0.2500 mg | INTRAMUSCULAR | Status: DC | PRN

## 2018-08-30 MED ORDER — LACTATED RINGERS IV SOLN
INTRAVENOUS | Status: DC
  Administered 2018-08-30: 08:00:00 via INTRAVENOUS

## 2018-08-30 MED ORDER — CHLORHEXIDINE GLUCONATE CLOTH 2 % EX PADS: 6.0000 | MEDICATED_PAD | Freq: Once | CUTANEOUS | Status: DC

## 2018-08-30 MED ORDER — PROMETHAZINE HCL 25 MG/ML IJ SOLN: 6.2500 mg | INTRAMUSCULAR | Status: DC | PRN

## 2018-08-30 MED ORDER — PROPOFOL 10 MG/ML IV BOLUS
INTRAVENOUS | Status: DC | PRN
  Administered 2018-08-30: 40 mg via INTRAVENOUS

## 2018-08-30 MED ORDER — HYDROCODONE-ACETAMINOPHEN 7.5-325 MG PO TABS: 1.0000 | ORAL_TABLET | Freq: Once | ORAL | Status: DC | PRN

## 2018-08-30 MED ORDER — STERILE WATER FOR IRRIGATION IR SOLN
Status: DC | PRN
  Administered 2018-08-30: 1.5 mL

## 2018-08-30 MED ORDER — MIDAZOLAM HCL 2 MG/2ML IJ SOLN: 0.5000 mg | Freq: Once | INTRAMUSCULAR | Status: DC | PRN

## 2018-08-30 NOTE — Anesthesia Preprocedure Evaluation (Signed)
Anesthesia Evaluation  Patient identified by MRN, date of birth, ID band Patient awake    Reviewed: Allergy & Precautions, NPO status , Patient's Chart, lab work & pertinent test results  Airway Mallampati: II  TM Distance: >3 FB Neck ROM: Full    Dental no notable dental hx. (+) Teeth Intact   Pulmonary COPD, former smoker,  Ex smoker    Pulmonary exam normal breath sounds clear to auscultation       Cardiovascular Exercise Tolerance: Good negative cardio ROS Normal cardiovascular examI Rhythm:Regular Rate:Normal  Pt with atypical CP seen by cardiology  I dont have any result except old ecg Will proceed with low risk procedure  If issues may abort    Neuro/Psych negative neurological ROS  negative psych ROS   GI/Hepatic negative GI ROS, Neg liver ROS,   Endo/Other  negative endocrine ROSdiabetes, Well Controlled, Type 2, Oral Hypoglycemic Agents  Renal/GU negative Renal ROS  negative genitourinary   Musculoskeletal  (+) Arthritis ,   Abdominal   Peds negative pediatric ROS (+)  Hematology negative hematology ROS (+)   Anesthesia Other Findings   Reproductive/Obstetrics negative OB ROS                             Anesthesia Physical Anesthesia Plan  ASA: III  Anesthesia Plan: General   Post-op Pain Management:    Induction: Intravenous  PONV Risk Score and Plan:   Airway Management Planned: Nasal Cannula and Simple Face Mask  Additional Equipment:   Intra-op Plan:   Post-operative Plan:   Informed Consent: I have reviewed the patients History and Physical, chart, labs and discussed the procedure including the risks, benefits and alternatives for the proposed anesthesia with the patient or authorized representative who has indicated his/her understanding and acceptance.     Dental advisory given  Plan Discussed with: CRNA  Anesthesia Plan Comments:          Anesthesia Quick Evaluation

## 2018-08-30 NOTE — Transfer of Care (Signed)
Immediate Anesthesia Transfer of Care Note  Patient: Travis Beck  Procedure(s) Performed: ESOPHAGOGASTRODUODENOSCOPY (EGD) WITH PROPOFOL (N/A ) BIOPSY  Patient Location: PACU  Anesthesia Type:General  Level of Consciousness: awake, alert  and patient cooperative  Airway & Oxygen Therapy: Patient Spontanous Breathing and Patient connected to nasal cannula oxygen  Post-op Assessment: Report given to RN and Post -op Vital signs reviewed and stable  Post vital signs: Reviewed and stable  Last Vitals:  Vitals Value Taken Time  BP    Temp    Pulse 69 08/30/2018  9:54 AM  Resp 15 08/30/2018  9:54 AM  SpO2 92 % 08/30/2018  9:54 AM  Vitals shown include unvalidated device data.  Last Pain:  Vitals:   08/30/18 0938  TempSrc:   PainSc: 0-No pain      Patients Stated Pain Goal: 6 (26/33/35 4562)  Complications: No apparent anesthesia complications

## 2018-08-30 NOTE — H&P (Signed)
Travis Beck is an 73 y.o. male.   Chief Complaint: Patient is here for EGD and  ED. HPI: Patient is 72 year old  Caucasian male who has a history of GERD and has been on pantoprazole 40 mg daily who is been experiencing intermittent solid food dysphagia for the last few months.  He points to lower sternal area as site of bolus obstruction.  Few weeks ago he experienced 2 episode of hematemesis in the evening.  He does not recall that he had dysphagia prior to that.  He also had melena but this resolved spontaneously.  Patient denies nausea vomiting epigastric pain or weight loss.  He was seen in our office 10 days ago when his hemoglobin was 10.1.  He denies postural symptoms.  He states he used to have dizziness but since his blood pressure medication was decreased he is not having dizzy spells anymore.  He does not take aspirin or OTC NSAIDs.  Past Medical History:  Diagnosis Date  . Arthritis   . Diabetes ([place])        GERD      Chronic bronchitis with emphysema.      Chronic pain.  Past Surgical History:  Procedure Laterality Date  . COLONOSCOPY    . HERNIA REPAIR     bilateral lower abdomen    History reviewed. No pertinent family history. Social History:  reports that he quit smoking about 25 years ago. His smoking use included cigarettes. He has a 66.00 pack-year smoking history. He has never used smokeless tobacco. He reports previous alcohol use. He reports that he does not use drugs.  Allergies:  Allergies  Allergen Reactions  . Penicillins Rash    Medications Prior to Admission  Medication Sig Dispense Refill  . ALPRAZolam (XANAX) 1 MG tablet Take 1 mg by mouth 3 (three) times daily.     Marland Kitchen amLODipine (NORVASC) 10 MG tablet Take 10 mg by mouth daily.  1  . buprenorphine (SUBUTEX) 2 MG SUBL SL tablet Place 2 mg under the tongue 2 (two) times daily.   1  . Cyanocobalamin (B-12) 1000 MCG SUBL Place 1 mL under the tongue every 30 (thirty) days.    . fluticasone  (FLONASE) 50 MCG/ACT nasal spray Place 2 sprays into both nostrils daily.     Marland Kitchen gabapentin (NEURONTIN) 400 MG capsule Take 400 mg by mouth every 8 (eight) hours.   1  . glyBURIDE (DIABETA) 5 MG tablet Take 10 mg by mouth 2 (two) times daily with a meal.     . hydrALAZINE (APRESOLINE) 25 MG tablet Take 25 mg by mouth 2 (two) times daily.    Marland Kitchen ibuprofen (ADVIL,MOTRIN) 800 MG tablet Take 800 mg by mouth 2 (two) times daily as needed for mild pain or moderate pain.     Marland Kitchen lisinopril (PRINIVIL,ZESTRIL) 10 MG tablet Take 10 mg by mouth daily.    . metFORMIN (GLUCOPHAGE) 1000 MG tablet Take 1,000 mg by mouth 2 (two) times daily with a meal.     . pantoprazole (PROTONIX) 40 MG tablet Take 1 tablet (40 mg total) by mouth 2 (two) times daily before a meal. (Patient taking differently: Take 40 mg by mouth daily. ) 180 tablet 3  . pravastatin (PRAVACHOL) 20 MG tablet Take 20 mg by mouth every evening.     . cyclobenzaprine (FLEXERIL) 10 MG tablet Take 10 mg by mouth every 8 (eight) hours as needed for muscle spasms.       Results for orders placed  or performed during the hospital encounter of 08/30/18 (from the past 48 hour(s))  Glucose, capillary     Status: None   Collection Time: 08/30/18  7:45 AM  Result Value Ref Range   Glucose-Capillary 86 70 - 99 mg/dL   No results found.  ROS  Blood pressure 130/77, pulse 76, temperature 97.7 F (36.5 C), temperature source Oral, resp. rate 16, SpO2 94 %. Physical Exam  Constitutional: He appears well-developed and well-nourished.  HENT:  Mouth/Throat: Oropharynx is clear and moist.  Eyes: Conjunctivae are normal. No scleral icterus.  Neck: No thyromegaly present.  Cardiovascular: Normal rate, regular rhythm and normal heart sounds.  No murmur heard. Respiratory: Effort normal and breath sounds normal.  GI: Soft. He exhibits no distension and no mass. There is no abdominal tenderness.  Musculoskeletal:        General: No edema.  Lymphadenopathy:     He has no cervical adenopathy.  Neurological: He is alert.  Skin: Skin is warm and dry.     Assessment/Plan History of upper GI bleed. Anemia. Solid food dysphagia. EGD with ED. H/H today.  Hildred Laser, MD 08/30/2018, 9:29 AM

## 2018-08-30 NOTE — Anesthesia Postprocedure Evaluation (Signed)
Anesthesia Post Note  Patient: Travis Beck  Procedure(s) Performed: ESOPHAGOGASTRODUODENOSCOPY (EGD) WITH PROPOFOL (N/A ) BIOPSY  Patient location during evaluation: PACU Anesthesia Type: General Level of consciousness: awake and patient cooperative Pain management: pain level controlled Vital Signs Assessment: post-procedure vital signs reviewed and stable Respiratory status: spontaneous breathing, nonlabored ventilation and respiratory function stable Cardiovascular status: blood pressure returned to baseline Postop Assessment: no apparent nausea or vomiting Anesthetic complications: no     Last Vitals:  Vitals:   08/30/18 1000 08/30/18 1015  BP: 105/66 109/64  Pulse: 68 69  Resp: 15 15  Temp:    SpO2: 94% 91%    Last Pain:  Vitals:   08/30/18 0955  TempSrc:   PainSc: 0-No pain                 [name]

## 2018-08-30 NOTE — Discharge Instructions (Signed)
Keep ibuprofen use to minimum.  Do not take more than 400 mg each time and no more than twice daily. Resume other medications as before.  Remember to take pantoprazole 30 minutes before breakfast and evening meal daily. Resume other medications as before. Resume usual diet. Antireflux measures. Physician will call with biopsy results next week. Office visit in 8 weeks.    Gastroesophageal Reflux Disease, Adult Gastroesophageal reflux (GER) happens when acid from the stomach flows up into the tube that connects the mouth and the stomach (esophagus). Normally, food travels down the esophagus and stays in the stomach to be digested. With GER, food and stomach acid sometimes move back up into the esophagus. You may have a disease called gastroesophageal reflux disease (GERD) if the reflux:  Happens often.  Causes frequent or very bad symptoms.  Causes problems such as damage to the esophagus. When this happens, the esophagus becomes sore and swollen (inflamed). Over time, GERD can make small holes (ulcers) in the lining of the esophagus. What are the causes? This condition is caused by a problem with the muscle between the esophagus and the stomach. When this muscle is weak or not normal, it does not close properly to keep food and acid from coming back up from the stomach. The muscle can be weak because of:  Tobacco use.  Pregnancy.  Having a certain type of hernia (hiatal hernia).  Alcohol use.  Certain foods and drinks, such as coffee, chocolate, onions, and peppermint. What increases the risk? You are more likely to develop this condition if you:  Are overweight.  Have a disease that affects your connective tissue.  Use NSAID medicines. What are the signs or symptoms? Symptoms of this condition include:  Heartburn.  Difficult or painful swallowing.  The feeling of having a lump in the throat.  A bitter taste in the mouth.  Bad breath.  Having a lot of  saliva.  Having an upset or bloated stomach.  Belching.  Chest pain. Different conditions can cause chest pain. Make sure you see your doctor if you have chest pain.  Shortness of breath or noisy breathing (wheezing).  Ongoing (chronic) cough or a cough at night.  Wearing away of the surface of teeth (tooth enamel).  Weight loss. How is this treated? Treatment will depend on how bad your symptoms are. Your doctor may suggest:  Changes to your diet.  Medicine.  Surgery. Follow these instructions at home: Eating and drinking   Follow a diet as told by your doctor. You may need to avoid foods and drinks such as: ? Coffee and tea (with or without caffeine). ? Drinks that contain alcohol. ? Energy drinks and sports drinks. ? Bubbly (carbonated) drinks or sodas. ? Chocolate and cocoa. ? Peppermint and mint flavorings. ? Garlic and onions. ? Horseradish. ? Spicy and acidic foods. These include peppers, chili powder, curry powder, vinegar, hot sauces, and BBQ sauce. ? Citrus fruit juices and citrus fruits, such as oranges, lemons, and limes. ? Tomato-based foods. These include red sauce, chili, salsa, and pizza with red sauce. ? Fried and fatty foods. These include donuts, french fries, potato chips, and high-fat dressings. ? High-fat meats. These include hot dogs, rib eye steak, sausage, ham, and bacon. ? High-fat dairy items, such as whole milk, butter, and cream cheese.  Eat small meals often. Avoid eating large meals.  Avoid drinking large amounts of liquid with your meals.  Avoid eating meals during the 2-3 hours before bedtime.  Avoid  lying down right after you eat.  Do not exercise right after you eat. Lifestyle   Do not use any products that contain nicotine or tobacco. These include cigarettes, e-cigarettes, and chewing tobacco. If you need help quitting, ask your doctor.  Try to lower your stress. If you need help doing this, ask your doctor.  If you are  overweight, lose an amount of weight that is healthy for you. Ask your doctor about a safe weight loss goal. General instructions  Pay attention to any changes in your symptoms.  Take over-the-counter and prescription medicines only as told by your doctor. Do not take aspirin, ibuprofen, or other NSAIDs unless your doctor says it is okay.  Wear loose clothes. Do not wear anything tight around your waist.  Raise (elevate) the head of your bed about 6 inches (15 cm).  Avoid bending over if this makes your symptoms worse.  Keep all follow-up visits as told by your doctor. This is important. Contact a doctor if:  You have new symptoms.  You lose weight and you do not know why.  You have trouble swallowing or it hurts to swallow.  You have wheezing or a cough that keeps happening.  Your symptoms do not get better with treatment.  You have a hoarse voice. Get help right away if:  You have pain in your arms, neck, jaw, teeth, or back.  You feel sweaty, dizzy, or light-headed.  You have chest pain or shortness of breath.  You throw up (vomit) and your throw-up looks like blood or coffee grounds.  You pass out (faint).  Your poop (stool) is bloody or black.  You cannot swallow, drink, or eat. Summary  If a person has gastroesophageal reflux disease (GERD), food and stomach acid move back up into the esophagus and cause symptoms or problems such as damage to the esophagus.  Treatment will depend on how bad your symptoms are.  Follow a diet as told by your doctor.  Take all medicines only as told by your doctor. This information is not intended to replace advice given to you by your health care provider. Make sure you discuss any questions you have with your health care provider. Document Released: 12/13/2007 Document Revised: 01/02/2018 Document Reviewed: 01/02/2018 Elsevier Interactive Patient Education  2019 Else[employer] Inc[employer]    Gastritis, Adult  Gastritis is swelling  (inflammation) of the stomach. Gastritis can develop quickly (acute). It can also develop slowly over time (chronic). It is important to get help for this condition. If you do not get help, your stomach can bleed, and you can get sores (ulcers) in your stomach. What are the causes? This condition may be caused by:  Germs that get to your stomach.  Drinking too much alcohol.  Medicines you are taking.  Too much acid in the stomach.  A disease of the intestines or stomach.  Stress.  An allergic reaction.  Crohn's disease.  Some cancer treatments (radiation). Sometimes the cause of this condition is not known. What are the signs or symptoms? Symptoms of this condition include:  Pain in your stomach.  A burning feeling in your stomach.  Feeling sick to your stomach (nauseous).  Throwing up (vomiting).  Feeling too full after you eat.  Weight loss.  Bad breath.  Throwing up blood.  Blood in your poop (stool). How is this diagnosed? This condition may be diagnosed with:  Your medical history and symptoms.  A physical exam.  Tests. These can include: ? Blood tests. ?  Stool tests. ? A procedure to look inside your stomach (upper endoscopy). ? A test in which a sample of tissue is taken for testing (biopsy). How is this treated? Treatment for this condition depends on what caused it. You may be given:  Antibiotic medicine, if your condition was caused by germs.  H2 blockers and similar medicines, if your condition was caused by too much acid. Follow these instructions at home: Medicines  Take over-the-counter and prescription medicines only as told by your doctor.  If you were prescribed an antibiotic medicine, take it as told by your doctor. Do not stop taking it even if you start to feel better. Eating and drinking   Eat small meals often, instead of large meals.  Avoid foods and drinks that make your symptoms worse.  Drink enough fluid to keep your  pee (urine) pale yellow. Alcohol use  Do not drink alcohol if: ? Your doctor tells you not to drink. ? You are pregnant, may be pregnant, or are planning to become pregnant.  If you drink alcohol: ? Limit your use to:  0-1 drink a day for women.  0-2 drinks a day for men. ? Be aware of how much alcohol is in your drink. In the [place] one drink equals one 12 oz bottle of beer (355 mL), one 5 oz glass of wine (148 mL), or one 1 oz glass of hard liquor (44 mL). General instructions  Talk with your doctor about ways to manage stress. You can exercise or do deep breathing, meditation, or yoga.  Do not smoke or use products that have nicotine or tobacco. If you need help quitting, ask your doctor.  Keep all follow-up visits as told by your doctor. This is important. Contact a doctor if:  Your symptoms get worse.  Your symptoms go away and then come back. Get help right away if:  You throw up blood or something that looks like coffee grounds.  You have black or dark red poop.  You throw up any time you try to drink fluids.  Your stomach pain gets worse.  You have a fever.  You do not feel better after one week. Summary  Gastritis is swelling (inflammation) of the stomach.  You must get help for this condition. If you do not get help, your stomach can bleed, and you can get sores (ulcers).  This condition is diagnosed with medical history, physical exam, or tests.  You can be treated with medicines for germs or medicines to block too much acid in your stomach. This information is not intended to replace advice given to you by your health care provider. Make sure you discuss any questions you have with your health care provider. Document Released: 12/13/2007 Document Revised: 11/13/2017 Document Reviewed: 11/13/2017 Elsevier Interactive Patient Education  2019 [employer].

## 2018-08-30 NOTE — Anesthesia Procedure Notes (Signed)
Procedure Name: MAC Date/Time: 08/30/2018 9:32 AM Performed by: Vista Deck, CRNA Pre-anesthesia Checklist: Patient identified, Emergency Drugs available, Suction available, Timeout performed and Patient being monitored Patient Re-evaluated:Patient Re-evaluated prior to induction Oxygen Delivery Method: Nasal Cannula

## 2018-08-30 NOTE — Op Note (Signed)
[facility] Patient Name: Travis Beck Procedure Date: 08/30/2018 8:18 AM MRN: [id number] Date of Birth: [birth year] Attending MD: Hildred Laser , MD CSN: [id number] Age: 73 Admit Type: Outpatient Procedure:                Upper GI endoscopy Indications:              Esophageal dysphagia, Hematemesis Providers:                Hildred Laser, MD, Charlsie Quest. Theda Sers RN, RN, Hinton Rao, RN, Randa Spike, Technician Referring MD:             Monico Blitz, MD Medicines:                Propofol per Anesthesia Complications:            No immediate complications. Estimated Blood Loss:     Estimated blood loss was minimal. Procedure:                Pre-Anesthesia Assessment:                           - Prior to the procedure, a History and Physical                            was performed, and patient medications and                            allergies were reviewed. The patient's tolerance of                            previous anesthesia was also reviewed. The risks                            and benefits of the procedure and the sedation                            options and risks were discussed with the patient.                            All questions were answered, and informed consent                            was obtained. Prior Anticoagulants: The patient                            last took ibuprofen 7 days prior to the procedure.                            ASA Grade Assessment: III - A patient with severe                            systemic disease. After reviewing the risks and  benefits, the patient was deemed in satisfactory                            condition to undergo the procedure.                           After obtaining informed consent, the endoscope was                            passed under direct vision. Throughout the                            procedure, the patient's blood pressure, pulse, and                     oxygen saturations were monitored continuously. The                            [id number]-[id number] ([id number]) was introduced through the                            mouth, and advanced to the second part of duodenum.                            The upper GI endoscopy was accomplished without                            difficulty. The patient tolerated the procedure                            well. Scope In: 9:38:54 AM Scope Out: 9:48:29 AM Total Procedure Duration: 0 hours 9 minutes 35 seconds  Findings:      The proximal esophagus and mid esophagus were normal.      LA Grade C (one or more mucosal breaks continuous between tops of 2 or       more mucosal folds, less than 75% circumference) esophagitis with no       bleeding was found 30 to 37 cm from the incisors.      A non-obstructing Schatzki ring was found at the gastroesophageal       junction.      A 3 cm hiatal hernia was present.      Patchy mild inflammation characterized by congestion (edema), erythema       and granularity was found in the gastric body. Biopsies were taken with       a cold forceps for histology.      The exam of the stomach was otherwise normal.      The duodenal bulb and second portion of the duodenum were normal. Impression:               - Normal proximal esophagus and mid esophagus.                           - LA Grade C reflux esophagitis.                           - Non-obstructing Schatzki ring  at [facility] not dilated                           - 3 cm hiatal hernia.                           - Gastritis. Biopsied.                           - Normal duodenal bulb and second portion of the                            duodenum. Moderate Sedation:      Per Anesthesia Care Recommendation:           - Patient has a contact number available for                            emergencies. The signs and symptoms of potential                            delayed complications were discussed with the                             patient. Return to normal activities tomorrow.                            Written discharge instructions were provided to the                            patient.                           - Resume previous diet today.                           - Continue present medications.                           - Await pathology results.                           - Return to GI clinic in 8 weeks. Procedure Code(s):        --- Professional ---                           [id number], Esophagogastroduodenoscopy, flexible,                            transoral; with biopsy, single or multiple Diagnosis Code(s):        --- Professional ---                           K21.0, Gastro-esophageal reflux disease with                            esophagitis  K22.2, Esophageal obstruction                           K44.9, Diaphragmatic hernia without obstruction or                            gangrene                           K29.70, Gastritis, unspecified, without bleeding                           R13.14, Dysphagia, pharyngoesophageal phase                           K92.0, Hematemesis CPT copyright 2018 American Medical Association. All rights reserved. The codes documented in this report are preliminary and upon coder review may  be revised to meet current compliance requirements. Hildred Laser, MD Hildred Laser, MD 08/30/2018 9:59:34 AM This report has been signed electronically. Number of Addenda: 0

## 2018-09-03 ENCOUNTER — Ambulatory Visit (HOSPITAL_COMMUNITY)
Admission: RE | Admit: 2018-09-03 | Discharge: 2018-09-03 | Disposition: A | Discharge status: Home / Self Care | Payer: Medicare Other | Source: Ambulatory Visit | Attending: Cardiovascular Disease | Admitting: Cardiovascular Disease

## 2018-09-03 ENCOUNTER — Encounter (HOSPITAL_COMMUNITY): Admission: RE | Admit: 2018-09-03 | Payer: Medicare Other | Source: Ambulatory Visit

## 2018-09-03 ENCOUNTER — Encounter (HOSPITAL_COMMUNITY): Payer: Self-pay

## 2018-09-03 DIAGNOSIS — R079 Chest pain, unspecified: Secondary | ICD-10-CM | POA: Insufficient documentation

## 2018-09-03 DIAGNOSIS — R9431 Abnormal electrocardiogram [ECG] [EKG]: Secondary | ICD-10-CM | POA: Insufficient documentation

## 2018-09-04 ENCOUNTER — Encounter (HOSPITAL_COMMUNITY): Payer: Self-pay | Admitting: Internal Medicine

## 2018-09-10 ENCOUNTER — Encounter (HOSPITAL_COMMUNITY): Payer: Medicare Other

## 2018-09-13 ENCOUNTER — Encounter (HOSPITAL_BASED_OUTPATIENT_CLINIC_OR_DEPARTMENT_OTHER)
Admission: RE | Admit: 2018-09-13 | Discharge: 2018-09-13 | Disposition: A | Discharge status: Home / Self Care | Payer: Medicare Other | Source: Ambulatory Visit | Attending: Cardiovascular Disease | Admitting: Cardiovascular Disease

## 2018-09-13 ENCOUNTER — Encounter (HOSPITAL_COMMUNITY)
Admission: RE | Admit: 2018-09-13 | Discharge: 2018-09-13 | Disposition: A | Discharge status: Home / Self Care | Payer: Medicare Other | Source: Ambulatory Visit | Attending: Cardiovascular Disease | Admitting: Cardiovascular Disease

## 2018-09-13 ENCOUNTER — Encounter (HOSPITAL_COMMUNITY): Payer: Self-pay

## 2018-09-13 DIAGNOSIS — R079 Chest pain, unspecified: Secondary | ICD-10-CM

## 2018-09-13 DIAGNOSIS — R9431 Abnormal electrocardiogram [ECG] [EKG]: Secondary | ICD-10-CM | POA: Diagnosis not present

## 2018-09-13 HISTORY — DX: Unspecified asthma, uncomplicated: J45.909

## 2018-09-13 HISTORY — DX: Essential (primary) hypertension: I10

## 2018-09-13 LAB — NM MYOCAR MULTI W/SPECT W/WALL MOTION / EF
CHL CUP MPHR: 146 {beats}/min
Estimated workload: 4.6 METS
Exercise duration (min): 2 min
Exercise duration (sec): 45 s
LHR: 0.43
LV dias vol: 86 mL (ref 62–150)
LV sys vol: 39 mL
Peak HR: 142 {beats}/min
Percent HR: 97 %
RPE: 15
Rest HR: 74 {beats}/min
SDS: 0
SRS: 3
SSS: 3
TID: 1.03

## 2018-09-13 MED ORDER — TECHNETIUM TC 99M TETROFOSMIN IV KIT
30.0000 | PACK | Freq: Once | INTRAVENOUS | Status: AC | PRN
  Administered 2018-09-13: 31 via INTRAVENOUS

## 2018-09-13 MED ORDER — SODIUM CHLORIDE 0.9% FLUSH
INTRAVENOUS | Status: AC
  Administered 2018-09-13: 10 mL via INTRAVENOUS
  Filled 2018-09-13: qty 10

## 2018-09-13 MED ORDER — REGADENOSON 0.4 MG/5ML IV SOLN
INTRAVENOUS | Status: AC
  Filled 2018-09-13: qty 5

## 2018-09-13 MED ORDER — TECHNETIUM TC 99M TETROFOSMIN IV KIT
10.0000 | PACK | Freq: Once | INTRAVENOUS | Status: AC | PRN
  Administered 2018-09-13: 11 via INTRAVENOUS

## 2018-09-16 ENCOUNTER — Telehealth: Payer: Self-pay | Admitting: *Deleted

## 2018-09-16 ENCOUNTER — Encounter: Payer: Self-pay | Admitting: *Deleted

## 2018-09-16 MED ORDER — AMLODIPINE BESYLATE 5 MG PO TABS: 5.0000 mg | ORAL_TABLET | Freq: Every day | ORAL | 6 refills | Status: DC

## 2018-09-16 MED ORDER — METOPROLOL TARTRATE 25 MG PO TABS: 25.0000 mg | ORAL_TABLET | Freq: Two times a day (BID) | ORAL | 6 refills | Status: DC

## 2018-09-16 MED ORDER — ROSUVASTATIN CALCIUM 20 MG PO TABS: 20.0000 mg | ORAL_TABLET | Freq: Every day | ORAL | 6 refills | Status: DC

## 2018-09-16 NOTE — Telephone Encounter (Signed)
Notes recorded by Laurine Blazer, LPN on 09/14/1694 at 78:93 AM EDT Patient notified. Copy to pmd. Follow up scheduled for May with Dr. Bronson Ing in [place] office. New medications sent to [facility] now. Will also mail note to patient with the above medications changes per his request.   ------  Notes recorded by Herminio Commons, MD on 09/13/2018 at 2:19 PM EST Evidence of prior heart attack with only a small blockage. Overall it was a low risk study. Typically I would start aspirin but he is significantly anemic so I will avoid this. Reduce amlodipine to 5 mg daily and start Lopressor 25 mg twice daily. Switch pravastatin to rosuvastatin 20 mg daily.

## 2018-09-30 DIAGNOSIS — M542 Cervicalgia: Secondary | ICD-10-CM | POA: Diagnosis not present

## 2018-09-30 DIAGNOSIS — M25569 Pain in unspecified knee: Secondary | ICD-10-CM | POA: Diagnosis not present

## 2018-09-30 DIAGNOSIS — M130 Polyarthritis, unspecified: Secondary | ICD-10-CM | POA: Diagnosis not present

## 2018-09-30 DIAGNOSIS — M545 Low back pain: Secondary | ICD-10-CM | POA: Diagnosis not present

## 2018-10-17 DIAGNOSIS — E119 Type 2 diabetes mellitus without complications: Secondary | ICD-10-CM | POA: Diagnosis not present

## 2018-10-17 DIAGNOSIS — I10 Essential (primary) hypertension: Secondary | ICD-10-CM | POA: Diagnosis not present

## 2018-10-23 DIAGNOSIS — I10 Essential (primary) hypertension: Secondary | ICD-10-CM | POA: Diagnosis not present

## 2018-10-23 DIAGNOSIS — R42 Dizziness and giddiness: Secondary | ICD-10-CM | POA: Diagnosis not present

## 2018-10-23 DIAGNOSIS — J441 Chronic obstructive pulmonary disease with (acute) exacerbation: Secondary | ICD-10-CM | POA: Diagnosis not present

## 2018-10-23 DIAGNOSIS — Z299 Encounter for prophylactic measures, unspecified: Secondary | ICD-10-CM | POA: Diagnosis not present

## 2018-10-23 DIAGNOSIS — M791 Myalgia, unspecified site: Secondary | ICD-10-CM | POA: Diagnosis not present

## 2018-10-23 DIAGNOSIS — K219 Gastro-esophageal reflux disease without esophagitis: Secondary | ICD-10-CM | POA: Diagnosis not present

## 2018-10-23 DIAGNOSIS — Z6826 Body mass index (BMI) 26.0-26.9, adult: Secondary | ICD-10-CM | POA: Diagnosis not present

## 2018-10-28 ENCOUNTER — Ambulatory Visit (INDEPENDENT_AMBULATORY_CARE_PROVIDER_SITE_OTHER): Payer: Medicare Other | Admitting: Internal Medicine

## 2018-10-28 ENCOUNTER — Other Ambulatory Visit: Payer: Self-pay

## 2018-10-28 ENCOUNTER — Encounter (INDEPENDENT_AMBULATORY_CARE_PROVIDER_SITE_OTHER): Payer: Self-pay | Admitting: Internal Medicine

## 2018-10-28 VITALS — BP 144/82 | HR 61 | Temp 97.8°F | Resp 18 | Ht 65.0 in | Wt 151.5 lb

## 2018-10-28 DIAGNOSIS — K2100 Gastro-esophageal reflux disease with esophagitis, without bleeding: Secondary | ICD-10-CM

## 2018-10-28 DIAGNOSIS — D649 Anemia, unspecified: Secondary | ICD-10-CM | POA: Diagnosis not present

## 2018-10-28 LAB — CBC
HCT: 35.3 % — ABNORMAL LOW (ref 38.5–50.0)
Hemoglobin: 11.5 g/dL — ABNORMAL LOW (ref 13.2–17.1)
MCH: 25.9 pg — ABNORMAL LOW (ref 27.0–33.0)
MCHC: 32.6 g/dL (ref 32.0–36.0)
MCV: 79.5 fL — ABNORMAL LOW (ref 80.0–100.0)
MPV: 9.1 fL (ref 7.5–12.5)
Platelets: 450 10*3/uL — ABNORMAL HIGH (ref 140–400)
RBC: 4.44 10*6/uL (ref 4.20–5.80)
RDW: 15.1 % — ABNORMAL HIGH (ref 11.0–15.0)
WBC: 8.9 10*3/uL (ref 3.8–10.8)

## 2018-10-28 NOTE — Progress Notes (Signed)
Presenting complaint;  Follow-up for dysphagia and anemia.  Database and subjective:  Patient is 73 year old Caucasian male who was seen in the office on 08/20/2018 with history of solid food dysphagia and he also reported 2 episodes of coffee-ground emesis.  Last episode occurred four weeks prior to his office visit.  Patient was begun on pantoprazole 40 mg twice daily. He underwent EGD on 08/30/2018 revealing grade C reflux esophagitis.  He had long linear ulceration as well as focal ulceration.  He had noncritical ring at GE junction.  Esophagus was not dilated given endoscopic findings.  He also had small sliding hiatal hernia and gastritis.  Gastric biopsy was negative for H. pylori.  Patient's hemoglobin was checked on the day of procedure and was 8.8 g.  Patient was also advised to take [place] chewable with iron 1 tablet daily.  Patient is accompanied by his brother today.  They have both high using masks.  Patient states he is doing well.  He is been eating slowly and chewing his food thoroughly.  He has not had any more episodes of dysphagia.  He also has not had any heartburn since he has been on PPI.  He is watching his diet.  He rarely drinks carbonated drinks.  He also denies nausea vomiting sore throat or cough.  He did have some hoarseness for which she was treated.  He feels was due to allergies.  His bowels move 2-3 times a week.  He is on a stool softener and he also takes OTC laxative on as-needed basis.  He does not remember the name.  He denies melena or rectal bleeding.  He has occasional postural lightheadedness.   Current Medications: Outpatient Encounter Medications as of 10/28/2018  Medication Sig  . ALPRAZolam (XANAX) 1 MG tablet Take 1 mg by mouth 3 (three) times daily.   Marland Kitchen amLODipine (NORVASC) 5 MG tablet Take 1 tablet (5 mg total) by mouth daily.  . buprenorphine (SUBUTEX) 2 MG SUBL SL tablet Place 2 mg under the tongue 2 (two) times daily.   . fluticasone (FLONASE)  50 MCG/ACT nasal spray Place 2 sprays into both nostrils daily.   Marland Kitchen gabapentin (NEURONTIN) 400 MG capsule Take 400 mg by mouth every 8 (eight) hours.   Marland Kitchen glyBURIDE (DIABETA) 5 MG tablet Take 10 mg by mouth 2 (two) times daily with a meal.   . hydrALAZINE (APRESOLINE) 25 MG tablet Take 25 mg by mouth 2 (two) times daily.  Marland Kitchen lisinopril (PRINIVIL,ZESTRIL) 10 MG tablet Take 10 mg by mouth daily.  . metFORMIN (GLUCOPHAGE) 1000 MG tablet Take 1,000 mg by mouth 2 (two) times daily with a meal.   . metoprolol tartrate (LOPRESSOR) 25 MG tablet Take 1 tablet (25 mg total) by mouth 2 (two) times daily.  . Multiple Vitamin (MULTIVITAMIN WITH MINERALS) TABS tablet Take 1 tablet by mouth daily.  . multivitamin (VIT W/EXTRA C) CHEW chewable tablet Chew 1 tablet by mouth daily.  . Omega-3 Fatty Acids (FISH OIL) 1000 MG CAPS Take 1,000 mg by mouth 2 (two) times a day.  . pantoprazole (PROTONIX) 40 MG tablet Take 1 tablet (40 mg total) by mouth 2 (two) times daily before a meal. (Patient taking differently: Take 40 mg by mouth daily. )  . rosuvastatin (CRESTOR) 20 MG tablet Take 1 tablet (20 mg total) by mouth daily.  . vitamin B-12 (CYANOCOBALAMIN) 500 MCG tablet Take 500 mcg by mouth daily.  . [DISCONTINUED] Cyanocobalamin (B-12) 1000 MCG SUBL Place 1 mL under the  tongue daily.   . [DISCONTINUED] cyclobenzaprine (FLEXERIL) 10 MG tablet Take 10 mg by mouth every 8 (eight) hours as needed for muscle spasms.   . [DISCONTINUED] ibuprofen (ADVIL,MOTRIN) 800 MG tablet Take 0.5 tablets (400 mg total) by mouth 2 (two) times daily as needed for mild pain or moderate pain.   No facility-administered encounter medications on file as of 10/28/2018.     Objective: Blood pressure (!) 144/82, pulse 61, temperature 97.8 F (36.6 C), temperature source Oral, resp. rate 18, height 5\' 5"  (1.651 m), weight 151 lb 8 oz (68.7 kg). Patient is alert and in no acute distress. Conjunctiva is pink. Sclera is nonicteric Oropharyngeal  mucosa is normal. No neck masses or thyromegaly noted. Cardiac exam with regular rhythm normal S1 and S2. No murmur or gallop noted. Lungs are clear to auscultation. Abdomen is soft and nontender with organomegaly or masses. No LE edema or clubbing noted.  Labs/studies Results:   CBC Latest Ref Rng & Units 08/30/2018 08/20/2018  Hemoglobin 13.0 - 17.0 g/dL 8.8(L) 10.1(L)  Hematocrit 39.0 - 52.0 % 29.4(L) 31.8(L)     Assessment:  #1.  Erosive/ulcerative reflux esophagitis documented on EGD of 08/30/2018.  Patient's GERD symptoms are well controlled with therapy. He was also noted to have noncritical Schatzki's ring which was not dilated given florid changes of reflux esophagitis.  He may or may not have to be dilated in future depending on his symptoms.  #2.  Anemia secondary to upper GI bleed secondary to reflux esophagitis.  He does not appear pale today like he did at the time of EGD.    Plan:  Patient will continue antireflux measures as before. Continue pantoprazole at a dose of 40 mg twice daily.  He will take it 30 minutes before breakfast and evening meal. Patient will go to the lab for CBC. Patient will call office if he has swallowing difficulty in which case we will proceed with EGD with EGD. He will return for office visit in 3 months.

## 2018-10-28 NOTE — Patient Instructions (Addendum)
Please call office if you begin to have swallowing difficulty. Physician will call with results of blood work when completed.

## 2018-10-31 ENCOUNTER — Other Ambulatory Visit (INDEPENDENT_AMBULATORY_CARE_PROVIDER_SITE_OTHER): Payer: Self-pay | Admitting: *Deleted

## 2018-10-31 DIAGNOSIS — D508 Other iron deficiency anemias: Secondary | ICD-10-CM

## 2018-11-05 DIAGNOSIS — Z299 Encounter for prophylactic measures, unspecified: Secondary | ICD-10-CM | POA: Diagnosis not present

## 2018-11-05 DIAGNOSIS — J449 Chronic obstructive pulmonary disease, unspecified: Secondary | ICD-10-CM | POA: Diagnosis not present

## 2018-11-05 DIAGNOSIS — E1165 Type 2 diabetes mellitus with hyperglycemia: Secondary | ICD-10-CM | POA: Diagnosis not present

## 2018-11-05 DIAGNOSIS — E785 Hyperlipidemia, unspecified: Secondary | ICD-10-CM | POA: Diagnosis not present

## 2018-11-05 DIAGNOSIS — I10 Essential (primary) hypertension: Secondary | ICD-10-CM | POA: Diagnosis not present

## 2018-11-05 DIAGNOSIS — Z6825 Body mass index (BMI) 25.0-25.9, adult: Secondary | ICD-10-CM | POA: Diagnosis not present

## 2018-11-26 DIAGNOSIS — M542 Cervicalgia: Secondary | ICD-10-CM | POA: Diagnosis not present

## 2018-11-26 DIAGNOSIS — M25569 Pain in unspecified knee: Secondary | ICD-10-CM | POA: Diagnosis not present

## 2018-11-26 DIAGNOSIS — M545 Low back pain: Secondary | ICD-10-CM | POA: Diagnosis not present

## 2018-11-26 DIAGNOSIS — M130 Polyarthritis, unspecified: Secondary | ICD-10-CM | POA: Diagnosis not present

## 2018-12-04 ENCOUNTER — Encounter: Payer: Self-pay | Admitting: *Deleted

## 2018-12-04 ENCOUNTER — Other Ambulatory Visit: Payer: Self-pay

## 2018-12-04 ENCOUNTER — Telehealth (INDEPENDENT_AMBULATORY_CARE_PROVIDER_SITE_OTHER): Payer: Medicare Other | Admitting: Cardiovascular Disease

## 2018-12-04 ENCOUNTER — Encounter: Payer: Self-pay | Admitting: Cardiovascular Disease

## 2018-12-04 VITALS — Ht 65.0 in | Wt 155.0 lb

## 2018-12-04 DIAGNOSIS — E119 Type 2 diabetes mellitus without complications: Secondary | ICD-10-CM

## 2018-12-04 DIAGNOSIS — I25118 Atherosclerotic heart disease of native coronary artery with other forms of angina pectoris: Secondary | ICD-10-CM | POA: Diagnosis not present

## 2018-12-04 DIAGNOSIS — I10 Essential (primary) hypertension: Secondary | ICD-10-CM

## 2018-12-04 DIAGNOSIS — D649 Anemia, unspecified: Secondary | ICD-10-CM

## 2018-12-04 DIAGNOSIS — E785 Hyperlipidemia, unspecified: Secondary | ICD-10-CM

## 2018-12-04 DIAGNOSIS — R9439 Abnormal result of other cardiovascular function study: Secondary | ICD-10-CM

## 2018-12-04 NOTE — Patient Instructions (Signed)

## 2018-12-04 NOTE — Progress Notes (Signed)
Virtual Visit via Telephone Note   This visit type was conducted due to national recommendations for restrictions regarding the COVID-19 Pandemic (e.g. social distancing) in an effort to limit this patient's exposure and mitigate transmission in our community.  Due to his co-morbid illnesses, this patient is at least at moderate risk for complications without adequate follow up.  This format is felt to be most appropriate for this patient at this time.  The patient did not have access to video technology/had technical difficulties with video requiring transitioning to audio format only (telephone).  All issues noted in this document were discussed and addressed.  No physical exam could be performed with this format.  Please refer to the patient's chart for his  consent to telehealth for [facility].   Date:  12/04/2018   ID:  Travis Beck, DOB [birth year], MRN [id number]  Patient Location: Home Provider Location: Office  PCP:  Monico Blitz, MD  Cardiologist:  Kate Sable, MD  Electrophysiologist:  None   Evaluation Performed:  Follow-Up Visit  Chief Complaint:  Chest pain  History of Present Illness:    Travis Beck is a 73 y.o. male with hyperlipidemia, type 2 diabetes mellitus, emphysema and a prior history of tobacco use.  Nuclear stress test on 09/13/2018 was suggestive of a prior MI.  Typically I would have started aspirin but due to him being significantly anemic I have voided this.  I reduced amlodipine to 5 mg daily and started Lopressor 25 mg twice daily.  I also switched pravastatin to rosuvastatin 20 mg daily.  He denies chest pain and palpitations. His shortness of breath due to COPD is stable.   The patient does not have symptoms concerning for COVID-19 infection (fever, chills, cough, or new shortness of breath).    Past Medical History:  Diagnosis Date  . Arthritis   . Asthma    Childhood  . Diabetes ([place])   . Hypertension    Past Surgical  History:  Procedure Laterality Date  . BIOPSY  08/30/2018   Procedure: BIOPSY;  Surgeon: Rogene Houston, MD;  Location: [facility];  Service: Endoscopy;;  gastric   . COLONOSCOPY    . ESOPHAGOGASTRODUODENOSCOPY (EGD) WITH PROPOFOL N/A 08/30/2018   Procedure: ESOPHAGOGASTRODUODENOSCOPY (EGD) WITH PROPOFOL;  Surgeon: Rogene Houston, MD;  Location: [facility];  Service: Endoscopy;  Laterality: N/A;  1:30  . HERNIA REPAIR     bilateral lower abdomen     Current Meds  Medication Sig  . ALPRAZolam (XANAX) 1 MG tablet Take 1 mg by mouth 3 (three) times daily.   Marland Kitchen amLODipine (NORVASC) 5 MG tablet Take 1 tablet (5 mg total) by mouth daily.  . buprenorphine (SUBUTEX) 2 MG SUBL SL tablet Place 2 mg under the tongue 2 (two) times daily.   . fluticasone (FLONASE) 50 MCG/ACT nasal spray Place 2 sprays into both nostrils daily.   Marland Kitchen gabapentin (NEURONTIN) 400 MG capsule Take 400 mg by mouth every 8 (eight) hours.   Marland Kitchen glyBURIDE (DIABETA) 5 MG tablet Take 10 mg by mouth 2 (two) times daily with a meal.   . hydrALAZINE (APRESOLINE) 25 MG tablet Take 25 mg by mouth 2 (two) times daily.  Marland Kitchen lisinopril (PRINIVIL,ZESTRIL) 10 MG tablet Take 10 mg by mouth daily.  . metFORMIN (GLUCOPHAGE) 1000 MG tablet Take 1,000 mg by mouth 2 (two) times daily with a meal.   . metoprolol tartrate (LOPRESSOR) 25 MG tablet Take 1 tablet (25 mg total) by  mouth 2 (two) times daily.  . Multiple Vitamin (MULTIVITAMIN WITH MINERALS) TABS tablet Take 1 tablet by mouth daily.  . multivitamin (VIT W/EXTRA C) CHEW chewable tablet Chew 1 tablet by mouth daily.  . Omega-3 Fatty Acids (FISH OIL) 1000 MG CAPS Take 1,000 mg by mouth 2 (two) times a day.  . pantoprazole (PROTONIX) 40 MG tablet Take 1 tablet (40 mg total) by mouth 2 (two) times daily before a meal. (Patient taking differently: Take 40 mg by mouth 2 (two) times daily. )  . rosuvastatin (CRESTOR) 20 MG tablet Take 1 tablet (20 mg total) by mouth daily.  . vitamin B-12  (CYANOCOBALAMIN) 500 MCG tablet Take 500 mcg by mouth daily.     Allergies:   Penicillins   Social History   Tobacco Use  . Smoking status: Former Smoker    Packs/day: 2.00    Years: 33.00    Pack years: 66.00    Types: Cigarettes    Last attempt to quit: 01/22/1993    Years since quitting: 25.8  . Smokeless tobacco: Never Used  Substance Use Topics  . Alcohol use: Not Currently  . Drug use: Never     Family Hx: The patient's family history is not on file.  ROS:   Please see the history of present illness.     All other systems reviewed and are negative.   Prior CV studies:   The following studies were reviewed today:  Nuclear stress test 09/13/2018:   Blood pressure demonstrated a normal response to exercise.  There was no ST segment deviation noted during stress.  Findings consistent with prior moderate inferior myocardial infarction with mild peri-infarct ischemia.  The left ventricular ejection fraction is normal (55-65%).  This is a low risk study.  Labs/Other Tests and Data Reviewed:    EKG:  No ECG reviewed.  Recent Labs: 08/26/2018: BUN 9; Creatinine, Ser 0.81; Potassium 3.7; Sodium 136 10/28/2018: Hemoglobin 11.5; Platelets 450   Recent Lipid Panel No results found for: CHOL, TRIG, HDL, CHOLHDL, LDLCALC, LDLDIRECT  Wt Readings from Last 3 Encounters:  12/04/18 155 lb (70.3 kg)  10/28/18 151 lb 8 oz (68.7 kg)  08/26/18 161 lb (73 kg)     Objective:    Vital Signs:  Ht 5\' 5"  (1.651 m)   Wt 155 lb (70.3 kg)   BMI 25.79 kg/m    VITAL SIGNS:  reviewed  ASSESSMENT & PLAN:    1.  Coronary artery disease: This diagnosis is based on nuclear stress test reviewed above with evidence of prior infarct.  Symptomatically stable.  Continue Lopressor 25 mg twice daily and rosuvastatin 20 mg.  No aspirin given history of significant anemia.  2.  Hypertension: This will need continued monitoring.  No changes to therapy.  3.  Hyperlipidemia: Continue  rosuvastatin 20 mg.  Goal LDL less than 70. I will obtain a copy of lipids from PCP.  4.  Anemia: Hemoglobin 11.5 on 10/28/2018.  Had been 8.8 on 08/30/2018.  5. Type 2 diabetes mellitus: Currently on glyburide, metformin, and Januvia.  He is also on statin therapy.   COVID-19 Education: The signs and symptoms of COVID-19 were discussed with the patient and how to seek care for testing (follow up with PCP or arrange E-visit).  The importance of social distancing was discussed today.  Time:   Today, I have spent 15 minutes with the patient with telehealth technology discussing the above problems.     Medication Adjustments/Labs and Tests Ordered: Current  medicines are reviewed at length with the patient today.  Concerns regarding medicines are outlined above.   Tests Ordered: No orders of the defined types were placed in this encounter.   Medication Changes: No orders of the defined types were placed in this encounter.   Disposition:  Follow up in 6 month(s)  Signed, Kate Sable, MD  12/04/2018 11:33 AM    [facility]

## 2018-12-10 DIAGNOSIS — I10 Essential (primary) hypertension: Secondary | ICD-10-CM | POA: Diagnosis not present

## 2018-12-10 DIAGNOSIS — Z299 Encounter for prophylactic measures, unspecified: Secondary | ICD-10-CM | POA: Diagnosis not present

## 2018-12-10 DIAGNOSIS — J069 Acute upper respiratory infection, unspecified: Secondary | ICD-10-CM | POA: Diagnosis not present

## 2018-12-10 DIAGNOSIS — Z6825 Body mass index (BMI) 25.0-25.9, adult: Secondary | ICD-10-CM | POA: Diagnosis not present

## 2018-12-10 DIAGNOSIS — J449 Chronic obstructive pulmonary disease, unspecified: Secondary | ICD-10-CM | POA: Diagnosis not present

## 2018-12-10 DIAGNOSIS — Z87891 Personal history of nicotine dependence: Secondary | ICD-10-CM | POA: Diagnosis not present

## 2018-12-11 ENCOUNTER — Encounter: Payer: Self-pay | Admitting: *Deleted

## 2018-12-12 DIAGNOSIS — I10 Essential (primary) hypertension: Secondary | ICD-10-CM | POA: Diagnosis not present

## 2018-12-12 DIAGNOSIS — E119 Type 2 diabetes mellitus without complications: Secondary | ICD-10-CM | POA: Diagnosis not present

## 2018-12-18 DIAGNOSIS — Z87891 Personal history of nicotine dependence: Secondary | ICD-10-CM | POA: Diagnosis not present

## 2018-12-18 DIAGNOSIS — I10 Essential (primary) hypertension: Secondary | ICD-10-CM | POA: Diagnosis not present

## 2018-12-18 DIAGNOSIS — R001 Bradycardia, unspecified: Secondary | ICD-10-CM | POA: Diagnosis not present

## 2018-12-18 DIAGNOSIS — R42 Dizziness and giddiness: Secondary | ICD-10-CM | POA: Diagnosis not present

## 2018-12-18 DIAGNOSIS — E119 Type 2 diabetes mellitus without complications: Secondary | ICD-10-CM | POA: Diagnosis not present

## 2018-12-18 DIAGNOSIS — R918 Other nonspecific abnormal finding of lung field: Secondary | ICD-10-CM | POA: Diagnosis not present

## 2018-12-25 DIAGNOSIS — E1165 Type 2 diabetes mellitus with hyperglycemia: Secondary | ICD-10-CM | POA: Diagnosis not present

## 2018-12-25 DIAGNOSIS — Z299 Encounter for prophylactic measures, unspecified: Secondary | ICD-10-CM | POA: Diagnosis not present

## 2018-12-25 DIAGNOSIS — I10 Essential (primary) hypertension: Secondary | ICD-10-CM | POA: Diagnosis not present

## 2018-12-25 DIAGNOSIS — Z6826 Body mass index (BMI) 26.0-26.9, adult: Secondary | ICD-10-CM | POA: Diagnosis not present

## 2018-12-25 DIAGNOSIS — J449 Chronic obstructive pulmonary disease, unspecified: Secondary | ICD-10-CM | POA: Diagnosis not present

## 2018-12-30 DIAGNOSIS — F329 Major depressive disorder, single episode, unspecified: Secondary | ICD-10-CM | POA: Diagnosis not present

## 2019-01-02 ENCOUNTER — Telehealth: Payer: Self-pay | Admitting: Pulmonary Disease

## 2019-01-02 NOTE — Telephone Encounter (Signed)
Call returned to patient sister, she states he is having increased mucous production, SOB, increased wheezing especially at night, denies chest pain. She states she would like to make an appt. Appt made for 06/26. Nothing further is needed at this time. Covid screen negative.

## 2019-01-03 ENCOUNTER — Ambulatory Visit (INDEPENDENT_AMBULATORY_CARE_PROVIDER_SITE_OTHER): Payer: Medicare Other | Admitting: Adult Health

## 2019-01-03 ENCOUNTER — Encounter: Payer: Self-pay | Admitting: Adult Health

## 2019-01-03 ENCOUNTER — Other Ambulatory Visit: Payer: Self-pay

## 2019-01-03 ENCOUNTER — Telehealth: Payer: Self-pay

## 2019-01-03 ENCOUNTER — Ambulatory Visit (INDEPENDENT_AMBULATORY_CARE_PROVIDER_SITE_OTHER): Payer: Medicare Other

## 2019-01-03 VITALS — BP 122/72 | HR 53 | Ht 65.0 in | Wt 157.6 lb

## 2019-01-03 DIAGNOSIS — Z20822 Contact with and (suspected) exposure to covid-19: Secondary | ICD-10-CM

## 2019-01-03 DIAGNOSIS — J449 Chronic obstructive pulmonary disease, unspecified: Secondary | ICD-10-CM | POA: Diagnosis not present

## 2019-01-03 DIAGNOSIS — R918 Other nonspecific abnormal finding of lung field: Secondary | ICD-10-CM

## 2019-01-03 DIAGNOSIS — R062 Wheezing: Secondary | ICD-10-CM

## 2019-01-03 DIAGNOSIS — I25118 Atherosclerotic heart disease of native coronary artery with other forms of angina pectoris: Secondary | ICD-10-CM | POA: Diagnosis not present

## 2019-01-03 DIAGNOSIS — R05 Cough: Secondary | ICD-10-CM | POA: Diagnosis not present

## 2019-01-03 MED ORDER — DOXYCYCLINE HYCLATE 100 MG PO TABS: 100.0000 mg | ORAL_TABLET | Freq: Two times a day (BID) | ORAL | 0 refills | Status: DC

## 2019-01-03 MED ORDER — BENZONATATE 200 MG PO CAPS: 200.0000 mg | ORAL_CAPSULE | Freq: Three times a day (TID) | ORAL | 1 refills | Status: DC | PRN

## 2019-01-03 MED ORDER — PREDNISONE 20 MG PO TABS: 20.0000 mg | ORAL_TABLET | Freq: Every day | ORAL | 0 refills | Status: AC

## 2019-01-03 NOTE — Patient Instructions (Addendum)
Doxycycline 100mg  Twice daily  For 7 days, take with food. Wear sunscreen  Mucinex DM Twice daily  As needed  Cough/congestion  Tessalon Three times a day  As needed  Cough  Prednisone 20mg  daily for 5 days, take with food.  Call if blood sugars > 250.  Discuss with primary MD that your Lisinopril is aggravating your cough and wheezing .  Refer for COVID 19 testing . You and family living in your home need to social distance and self quarantine to home until results return in 2-3 days .  Follow up with Dr. Vaughan Browner or Alicen Donalson NP in 4 weeks and As needed   Please contact office for sooner follow up if symptoms do not improve or worsen or seek emergency care

## 2019-01-03 NOTE — Assessment & Plan Note (Signed)
Exacerbation, patient with an acute respiratory symptoms.  Recent attendance at family get together We will need to have COVID testing and self quarantine until results are available We will treat shortly with steroids.  Patient advised to monitor blood sugars closely and report if blood sugars are greater than 250 as he has underlying diabetes Chest x-ray reviewed with no acute process noted Suspect lisinopril is aggravating his ongoing and recurrent cough and wheezing  Plan  Patient Instructions  Doxycycline 100mg  Twice daily  For 7 days, take with food. Wear sunscreen  Mucinex DM Twice daily  As needed  Cough/congestion  Tessalon Three times a day  As needed  Cough  Prednisone 20mg  daily for 5 days, take with food.  Call if blood sugars > 250.  Discuss with primary MD that your Lisinopril is aggravating your cough and wheezing .  Refer for COVID 19 testing . You and family living in your home need to social distance and self quarantine to home until results return in 2-3 days .  Follow up with Dr. Vaughan Browner or [name] NP in 4 weeks and As needed   Please contact office for sooner follow up if symptoms do not improve or worsen or seek emergency care

## 2019-01-03 NOTE — Telephone Encounter (Signed)
Patient's sister Lelan Pons calling back to schedule covid testing. NT unavailable. Please contact Lelan Pons to schedule.

## 2019-01-03 NOTE — Telephone Encounter (Signed)
Spoke with patient's brother Miquel Stacks at patient's home phone number- patient is not back from doctor's appointment yet.  Left message for patient to return call to schedule COVID 19 test.  Called patient's mobile number, voicemail not set up.Test ordered.

## 2019-01-03 NOTE — Telephone Encounter (Signed)
Per DPR spoke with patient's sister Lelan Pons- scheduled patient for COVID 19 test Tuesday 01/07/2019 at 8 am per [facility] request.  Testing protocol reviewed.

## 2019-01-03 NOTE — Assessment & Plan Note (Signed)
Left upper lobe nodule stable on CT.  Repeat CT October 2020

## 2019-01-03 NOTE — Progress Notes (Signed)
@Patient  ID: Travis Beck, male    DOB: 1946-04-22, 73 y.o.   MRN: [id number]  Chief Complaint  Patient presents with  . Acute Visit    Cough     Referring provider: Monico Blitz, MD  HPI: 73 year old male former smoker followed for emphysema and chronic bronchitis, abnormal CT chest-pulmonary nodule  TEST/EVENTS :  CT chest 12/21/2017- bilateral peribronchovascular nodularity, groundglass opacities left greater than right.  Dominant 1.1 cm left upper lobe nodule.  Mild centrilobular, paraseptal emphysema.  Left main and three-vessel coronary atherosclerosis.  Small hiatal hernia.    CT chest 04/27/2018- improvement in left upper lobe patchy airspace opacities, mild tree-in-bud in the upper lobes and lower lobes.  Peripheral subcentimeter pulmonary nodules.,  Aortic atherosclerosis I reviewed the images personally.  PFTs 04/17/2018 FVC 3.20 [92%), FEV1 2.45 [98%), F/F 77, TLC 125%, RV/DL 361%, DLCO 69% No obstruction, air trapping Mild reduction diffusion capacity.  01/03/2019 Acute OV : Cough  Patient presents for an acute office visit.  Patient complains over the last 4 weeks he has had increased cough, congestion , thick mucus and wheezing.  Says he was seen by his primary care physician and initially given some antibiotics with only minimal improvement in symptoms.  Was recently at a family party with multiple other family members.  No known sick exposure.  Denies fever. Says that cough and wheezing are getting worse..  Chest x-ray today shows no acute process.  Patient has underlying emphysema and chronic bronchitis.  Previous pulmonary function testing showed no significant restriction or airflow obstruction. Patient is on an ACE inhibitor with lisinopril.    Allergies  Allergen Reactions  . Penicillins Rash    Immunization History  Administered Date(s) Administered  . Influenza, High Dose Seasonal PF 04/09/2017, 04/22/2018  . Tdap 02/04/2018    Past  Medical History:  Diagnosis Date  . Arthritis   . Asthma    Childhood  . Diabetes ([place])   . Hypertension     Tobacco History: Social History   Tobacco Use  Smoking Status Former Smoker  . Packs/day: 2.00  . Years: 33.00  . Pack years: 66.00  . Types: Cigarettes  . Quit date: 01/22/1993  . Years since quitting: 25.9  Smokeless Tobacco Never Used   Counseling given: Not Answered   Outpatient Medications Prior to Visit  Medication Sig Dispense Refill  . ALPRAZolam (XANAX) 1 MG tablet Take 1 mg by mouth 3 (three) times daily.     Marland Kitchen amLODipine (NORVASC) 5 MG tablet Take 1 tablet (5 mg total) by mouth daily. 30 tablet 6  . buprenorphine (SUBUTEX) 2 MG SUBL SL tablet Place 2 mg under the tongue 2 (two) times daily.   1  . fluticasone (FLONASE) 50 MCG/ACT nasal spray Place 2 sprays into both nostrils daily.     Marland Kitchen gabapentin (NEURONTIN) 400 MG capsule Take 400 mg by mouth every 8 (eight) hours.   1  . glyBURIDE (DIABETA) 5 MG tablet Take 10 mg by mouth 2 (two) times daily with a meal.     . hydrALAZINE (APRESOLINE) 25 MG tablet Take 25 mg by mouth 2 (two) times daily.    Marland Kitchen lisinopril (PRINIVIL,ZESTRIL) 10 MG tablet Take 10 mg by mouth daily.    . metFORMIN (GLUCOPHAGE) 1000 MG tablet Take 1,000 mg by mouth 2 (two) times daily with a meal.     . Multiple Vitamin (MULTIVITAMIN WITH MINERALS) TABS tablet Take 1 tablet by mouth daily.    Marland Kitchen  multivitamin (VIT W/EXTRA C) CHEW chewable tablet Chew 1 tablet by mouth daily.    . Omega-3 Fatty Acids (FISH OIL) 1000 MG CAPS Take 1,000 mg by mouth 2 (two) times a day.    . pantoprazole (PROTONIX) 40 MG tablet Take 1 tablet (40 mg total) by mouth 2 (two) times daily before a meal. (Patient taking differently: Take 40 mg by mouth 2 (two) times daily. ) 180 tablet 3  . vitamin B-12 (CYANOCOBALAMIN) 500 MCG tablet Take 500 mcg by mouth daily.    Marland Kitchen albuterol (VENTOLIN HFA) 108 (90 Base) MCG/ACT inhaler     . metoprolol tartrate (LOPRESSOR) 25 MG tablet  Take 1 tablet (25 mg total) by mouth 2 (two) times daily. 60 tablet 6  . rosuvastatin (CRESTOR) 20 MG tablet Take 1 tablet (20 mg total) by mouth daily. 30 tablet 6   No facility-administered medications prior to visit.      Review of Systems:   Constitutional:   No  weight loss, night sweats,  Fevers, chills,  +fatigue, or  lassitude.  HEENT:   No headaches,  Difficulty swallowing,  Tooth/dental problems, or  Sore throat,                No sneezing, itching, ear ache, nasal congestion, post nasal drip,   CV:  No chest pain,  Orthopnea, PND, swelling in lower extremities, anasarca, dizziness, palpitations, syncope.   GI  No heartburn, indigestion, abdominal pain, nausea, vomiting, diarrhea, change in bowel habits, loss of appetite, bloody stools.   Resp:  .  No chest wall deformity  Skin: no rash or lesions.  GU: no dysuria, change in color of urine, no urgency or frequency.  No flank pain, no hematuria   MS:  No joint pain or swelling.  No decreased range of motion.  No back pain.    Physical Exam  BP 122/72 (BP Location: Right Arm, Cuff Size: Normal)   Pulse (!) 53   Ht 5\' 5"  (1.651 m)   Wt 157 lb 9.6 oz (71.5 kg)   SpO2 98%   BMI 26.23 kg/m   GEN: A/Ox3; pleasant , NAD, elderly    HEENT:  [place]/AT,  NOSE-clear, THROAT-clear, no lesions, no postnasal drip or exudate noted.   NECK:  Supple w/ fair ROM; no JVD; normal carotid impulses w/o bruits; no thyromegaly or nodules palpated; no lymphadenopathy.    RESP  Exp wheezing , speaks in full sentences ,  no accessory muscle use, no dullness to percussion  CARD:  RRR, no m/r/g, no peripheral edema, pulses intact, no cyanosis or clubbing.  GI:   Soft & nt; nml bowel sounds; no organomegaly or masses detected.   Musco: Warm bil, no deformities or joint swelling noted.   Neuro: alert, no focal deficits noted.    Skin: Warm, no lesions or rashes    Lab Results:  CBC  BNP No results found for: BNP  ProBNP No  results found for: PROBNP  Imaging: Dg Chest 2 View  Result Date: 01/03/2019 CLINICAL DATA:  Cough EXAM: CHEST - 2 VIEW COMPARISON:  None. FINDINGS: There are scattered small calcified granulomas. No edema or consolidation. Heart size and pulmonary vascularity are normal. No adenopathy. There is calcification in the left anterior descending coronary artery. There is aortic atherosclerosis. No bone lesions. IMPRESSION: No edema or consolidation.  Occasional tiny granulomas. Heart size normal. Foci of coronary artery calcification noted. Aortic Atherosclerosis (ICD10-I70.0). Electronically Signed   By: Lowella Grip III M.D.  On: 01/03/2019 09:35      PFT Results Latest Ref Rng & Units 04/17/2018  FVC-Pre L 3.02  FVC-Predicted Pre % 87  FVC-Post L 3.20  FVC-Predicted Post % 92  Pre FEV1/FVC % % 82  Post FEV1/FCV % % 77  FEV1-Pre L 2.49  FEV1-Predicted Pre % 100  FEV1-Post L 2.45  DLCO UNC% % 69  DLCO COR %Predicted % 86  TLC L 7.59  TLC % Predicted % 125  RV % Predicted % 199    No results found for: NITRICOXIDE      Assessment & Plan:   Chronic bronchitis with emphysema (HCC) Exacerbation, patient with an acute respiratory symptoms.  Recent attendance at family get together We will need to have COVID testing and self quarantine until results are available We will treat shortly with steroids.  Patient advised to monitor blood sugars closely and report if blood sugars are greater than 250 as he has underlying diabetes Chest x-ray reviewed with no acute process noted Suspect lisinopril is aggravating his ongoing and recurrent cough and wheezing  Plan  Patient Instructions  Doxycycline 100mg  Twice daily  For 7 days, take with food. Wear sunscreen  Mucinex DM Twice daily  As needed  Cough/congestion  Tessalon Three times a day  As needed  Cough  Prednisone 20mg  daily for 5 days, take with food.  Call if blood sugars > 250.  Discuss with primary MD that your Lisinopril  is aggravating your cough and wheezing .  Refer for COVID 19 testing . You and family living in your home need to social distance and self quarantine to home until results return in 2-3 days .  Follow up with Dr. Vaughan Browner or Sharnita Bogucki NP in 4 weeks and As needed   Please contact office for sooner follow up if symptoms do not improve or worsen or seek emergency care         Abnormal CT scan of lung Left upper lobe nodule stable on CT.  Repeat CT October 2020     Rexene Edison, NP 01/03/2019

## 2019-01-03 NOTE — Telephone Encounter (Signed)
Patient needs Covid testing, active symptoms, cough, congestion, worse per [facility]

## 2019-01-07 ENCOUNTER — Other Ambulatory Visit: Payer: Medicare Other

## 2019-01-07 DIAGNOSIS — R6889 Other general symptoms and signs: Secondary | ICD-10-CM | POA: Diagnosis not present

## 2019-01-14 ENCOUNTER — Telehealth: Payer: Self-pay | Admitting: Adult Health

## 2019-01-14 NOTE — Telephone Encounter (Signed)
I received a message from the results pool that they are backlogged and that the results will now be available in 5-7 business days instead of the 2 days.

## 2019-01-14 NOTE — Telephone Encounter (Signed)
Schedule tele-visit for evaluation with TP.  Please also route message to TP as FYI.  If TP believe that the patient would be a suitable candidate for an office visit then we can always change the appointment for an office visit.  For further evaluation.Wyn Quaker, FNP

## 2019-01-14 NOTE — Telephone Encounter (Signed)
Call made to patient sister, she reports the patient continue to wheeze and cough. She states he has completed all the antibiotics and the prednisone and he is using his nebulizer twice daily and he continues to remain congested and wheezing. She report he is coughing up yellow mucous. She would like to know if he needs his antibiotic extended or does he need to come back into the office. She reports she does not notice much improvement.   TP please advise. Thanks.

## 2019-01-14 NOTE — Telephone Encounter (Signed)
Left detailed message for Travis Beck stating that the results have not been uploaded to his chart. Per his chart, he had the test done on 01/07/19, a week ago. Also advised her that I would send a message over to the testing pool to check on status of results.   Will keep this encounter open and I will update once I have received a message back from the testing pool.

## 2019-01-14 NOTE — Telephone Encounter (Signed)
Faythe Ghee but it was almost 12 days ago on 01/03/19 .  Are they sure it was not lost.

## 2019-01-14 NOTE — Telephone Encounter (Signed)
Attempted to contact x 2. Mobile number does not have a voicemail. Attempted to contact on home number and his brother states he was not there. I asked for marie (dpr) who he also said was not home.

## 2019-01-14 NOTE — Telephone Encounter (Signed)
That is fine, I was trying to find his covid test results that was done 2 weeks ago   Can we call to see if this was done and where are the results.  If negative can come into office for visit

## 2019-01-14 NOTE — Telephone Encounter (Signed)
Patient did not get tested until 01/07/19, 7 days ago. The actual order was placed on 01/03/19.

## 2019-01-14 NOTE — Telephone Encounter (Signed)
Pt's sister called back to state that Bastion still has a cough and could someone call her because Mykal does not understand he gets confused. Lelan Pons [phone] Pharmacy [facility] store

## 2019-01-15 ENCOUNTER — Ambulatory Visit (INDEPENDENT_AMBULATORY_CARE_PROVIDER_SITE_OTHER): Payer: Medicare Other | Admitting: Adult Health

## 2019-01-15 ENCOUNTER — Encounter: Payer: Self-pay | Admitting: Adult Health

## 2019-01-15 ENCOUNTER — Other Ambulatory Visit: Payer: Self-pay

## 2019-01-15 DIAGNOSIS — J449 Chronic obstructive pulmonary disease, unspecified: Secondary | ICD-10-CM | POA: Diagnosis not present

## 2019-01-15 LAB — NOVEL CORONAVIRUS, NAA: SARS-CoV-2, NAA: NOT DETECTED

## 2019-01-15 NOTE — Telephone Encounter (Signed)
Pt has a televisit with TP today at 1145.

## 2019-01-15 NOTE — Progress Notes (Signed)
Virtual Visit via Telephone Note  I connected with Travis Beck on 01/15/19 at 11:45 AM EDT by telephone and verified that I am speaking with the correct person using two identifiers.  Location: Patient: Home  Provider: Office    I discussed the limitations, risks, security and privacy concerns of performing an evaluation and management service by telephone and the availability of in person appointments. I also discussed with the patient that there may be a patient responsible charge related to this service. The patient expressed understanding and agreed to proceed.   History of Present Illness: 73 year old male former smoker followed for emphysema and chronic bronchitis, abnormal CT chest-pulmonary nodule  Todays televisit is for a follow up for COPD exacerbation  Patient was seen 2 weeks ago for an acute on chronic bronchitic exacerbation.  He had ongoing cough congestion wheezing for 4 weeks.  He was initially treated with antibiotics with primary care physician.  Had minimal improvement in symptoms.  Last visit Chest x-ray was unremarkable.  SARS-CoV-2 testing was negative. He was given doxycycline x1 week and prednisone 20 mg for 5 days.  Patient says he did have some improvement but cough did not go away totally.  He still has ongoing cough and congestion with yellow mucus.  And intermittent wheezing. Patient is on ACE inhibitor with lisinopril was recommended to discuss this with his primary care physician as this medication may aggravate his cough .   Previous pulmonary function testing showed no significant restriction or airflow obstruction  Appetite is good with no chest pain orthopnea PND or increased leg swelling.   Observations/Objective: CT chest 12/21/2017-bilateral peribronchovascular nodularity, groundglass opacities left greater than right. Dominant 1.1 cm left upper lobe nodule. Mild centrilobular, paraseptal emphysema. Left main and three-vessel coronary  atherosclerosis. Small hiatal hernia.   CT chest 04/27/2018-improvement in left upper lobe patchy airspace opacities, mild tree-in-bud in the upper lobes and lower lobes. Peripheral subcentimeter pulmonary nodules., Aortic atherosclerosis I reviewed the images personally.  PFTs 04/17/2018 FVC 3.20 [92%), FEV1 2.45 [98%), F/F 77, TLC 125%, RV/DL 361%, DLCO 69% No obstruction,airtrapping Mild reduction diffusion capacity.   Assessment and Plan: Slow to resolve COPD exacerbation. Suspect ACE inhibitor is contributing to ongoing cough and wheezing.  Chest x-ray was unrevealing. If symptoms persist will need a sputum culture. If not improving will need an office visit for further evaluation and treatment options  Plan  Patient Instructions   Zpack take as directed.  Mucinex DM Twice daily  As needed  Cough/congestion  Tessalon Three times a day  As needed  Cough  Discuss with primary MD next week that your Lisinopril is aggravating your cough and wheezing .  Continue on Duoneb Twice daily .  Albuterol inhaler 2 puffs every 4hrs as needed.  Follow up with Dr. Vaughan Browner or Lechelle Wrigley NP in 4-6  weeks and As needed   Please contact office for sooner follow up if symptoms do not improve or worsen or seek emergency care         Follow Up Instructions: Follow up in 4 weeks and As needed   Please contact office for sooner follow up if symptoms do not improve or worsen or seek emergency care     I discussed the assessment and treatment plan with the patient. The patient was provided an opportunity to ask questions and all were answered. The patient agreed with the plan and demonstrated an understanding of the instructions.   The patient was advised to call back or  seek an in-person evaluation if the symptoms worsen or if the condition fails to improve as anticipated.  I provided 25  minutes of non-face-to-face time during this encounter.   Rexene Edison, NP

## 2019-01-15 NOTE — Patient Instructions (Addendum)
Zpack take as directed.  Mucinex DM Twice daily  As needed  Cough/congestion  Tessalon Three times a day  As needed  Cough  Discuss with primary MD next week that your Lisinopril is aggravating your cough and wheezing .  Continue on Duoneb Twice daily .  Albuterol inhaler 2 puffs every 4hrs as needed.  Follow up with Dr. Vaughan Browner or Jameir Ake NP in 4-6  weeks and As needed   Please contact office for sooner follow up if symptoms do not improve or worsen or seek emergency care

## 2019-01-16 ENCOUNTER — Telehealth: Payer: Self-pay | Admitting: Adult Health

## 2019-01-16 DIAGNOSIS — E119 Type 2 diabetes mellitus without complications: Secondary | ICD-10-CM | POA: Diagnosis not present

## 2019-01-16 DIAGNOSIS — I10 Essential (primary) hypertension: Secondary | ICD-10-CM | POA: Diagnosis not present

## 2019-01-16 MED ORDER — AZITHROMYCIN 250 MG PO TABS: ORAL_TABLET | ORAL | 0 refills | Status: AC

## 2019-01-16 NOTE — Telephone Encounter (Signed)
COVID test results finally became avail. There is a note in the results that pt is aware. Nothing further needed.

## 2019-01-16 NOTE — Telephone Encounter (Signed)
Called and spoke with pt's sister Lelan Pons. Pt had a televisit with TP yesterday, 7/8 and per Lelan Pons, no meds have been called to pharmacy that TP said she was going to call in for pt. Stated to Lelan Pons that I was going to send this to TP for her to review and we would get meds sent in for pt and she verbalized understanding.  Tammy, please advise on meds that needed to be sent to pharmacy for pt. Thanks!

## 2019-01-16 NOTE — Telephone Encounter (Signed)
Called and spoke with pt's sister Lelan Pons letting her know that TP sent zpak Rx to pharmacy for pt. Stated to her that pt should have one refill left on tessalon as last time it was sent to pharmacy on 6/26, there was one refill added on the  Rx. Lelan Pons verbalized understanding.  Also stated to Lelan Pons that TP said pt needed to discuss the lisinopril with PCP to see if med could possibly be changed as this could be contributing to pt's coughing and wheezing and she verbalized understanding. Nothing further needed.

## 2019-01-16 NOTE — Telephone Encounter (Signed)
Sorry the Zpack did not go thru   Will send now

## 2019-01-22 DIAGNOSIS — Z299 Encounter for prophylactic measures, unspecified: Secondary | ICD-10-CM | POA: Diagnosis not present

## 2019-01-22 DIAGNOSIS — E785 Hyperlipidemia, unspecified: Secondary | ICD-10-CM | POA: Diagnosis not present

## 2019-01-22 DIAGNOSIS — Z6825 Body mass index (BMI) 25.0-25.9, adult: Secondary | ICD-10-CM | POA: Diagnosis not present

## 2019-01-22 DIAGNOSIS — J449 Chronic obstructive pulmonary disease, unspecified: Secondary | ICD-10-CM | POA: Diagnosis not present

## 2019-01-22 DIAGNOSIS — I10 Essential (primary) hypertension: Secondary | ICD-10-CM | POA: Diagnosis not present

## 2019-01-22 DIAGNOSIS — E1165 Type 2 diabetes mellitus with hyperglycemia: Secondary | ICD-10-CM | POA: Diagnosis not present

## 2019-01-24 ENCOUNTER — Other Ambulatory Visit: Payer: Self-pay

## 2019-01-24 ENCOUNTER — Ambulatory Visit (INDEPENDENT_AMBULATORY_CARE_PROVIDER_SITE_OTHER): Payer: Medicare Other | Admitting: Adult Health

## 2019-01-24 ENCOUNTER — Encounter: Payer: Self-pay | Admitting: Adult Health

## 2019-01-24 DIAGNOSIS — I25118 Atherosclerotic heart disease of native coronary artery with other forms of angina pectoris: Secondary | ICD-10-CM | POA: Diagnosis not present

## 2019-01-24 DIAGNOSIS — R059 Cough, unspecified: Secondary | ICD-10-CM

## 2019-01-24 DIAGNOSIS — R911 Solitary pulmonary nodule: Secondary | ICD-10-CM | POA: Diagnosis not present

## 2019-01-24 DIAGNOSIS — K219 Gastro-esophageal reflux disease without esophagitis: Secondary | ICD-10-CM | POA: Insufficient documentation

## 2019-01-24 DIAGNOSIS — R05 Cough: Secondary | ICD-10-CM | POA: Diagnosis not present

## 2019-01-24 DIAGNOSIS — K2100 Gastro-esophageal reflux disease with esophagitis, without bleeding: Secondary | ICD-10-CM

## 2019-01-24 DIAGNOSIS — J449 Chronic obstructive pulmonary disease, unspecified: Secondary | ICD-10-CM | POA: Diagnosis not present

## 2019-01-24 MED ORDER — BUDESONIDE-FORMOTEROL FUMARATE 80-4.5 MCG/ACT IN AERO: 2.0000 | INHALATION_SPRAY | Freq: Two times a day (BID) | RESPIRATORY_TRACT | 0 refills | Status: DC

## 2019-01-24 NOTE — Patient Instructions (Addendum)
Mucinex  DM cough syrup Twice daily  As needed  Cough/congestion  Tessalon Three times a day  As needed  Cough  Increase Duoneb Three times a day   Begin Symbicort 80 2  puffs Twice daily  , rinse well after use.  Add flutter valve Three times a day  .  Sputum culture .  Check CT chest and sinus .  Albuterol inhaler 2 puffs every 4hrs as needed.  Continue on Protonix Twice daily  .  GERD diet  Aspirations precautions .  Follow up with Dr. Vaughan Browner or Parrett NP in 2-3  weeks and As needed   Please contact office for sooner follow up if symptoms do not improve or worsen or seek emergency care

## 2019-01-24 NOTE — Assessment & Plan Note (Signed)
Continue follow-up with GI.  Continue on Protonix twice daily.

## 2019-01-24 NOTE — Progress Notes (Signed)
@Patient  ID: Travis Beck, male    DOB: 07-08-1946, 73 y.o.   MRN: [id number]  Chief Complaint  Patient presents with  . Follow-up    COPD     Referring provider: Monico Blitz, MD  HPI: 73 year old male former smoker followed for emphysema and chronic bronchitis, abnormal CT chest with pulmonary nodule Medical hx is significant for DM , chronic pain (back, knees, shoulders) goes to pain clinic ,   TEST/EVENTS :  CT chest 12/21/2017-bilateral peribronchovascular nodularity, groundglass opacities left greater than right. Dominant 1.1 cm left upper lobe nodule. Mild centrilobular, paraseptal e, mphysema. Left main and three-vessel coronary atherosclerosis. Small hiatal hernia.   CT chest 04/27/2018-improvement in left upper lobe patchy airspace opacities, mild tree-in-bud in the upper lobes and lower lobes. Peripheral subcentimeter pulmonary nodules., Aortic atherosclerosis I reviewed the images personally.  PFTs 04/17/2018 FVC 3.20 [92%), FEV1 2.45 [98%), F/F 77, TLC 125%, RV/DL 361%, DLCO 69% No obstruction,airtrapping Mild reduction diffusion capacity.  January 07, 2019 SARS-CoV-2 negative  01/24/2019 Follow up: COPD , Cough  Patient presents to the office for a one-week follow-up.  Patient complains of a persistent cough that is been going on 4 to 6 weeks.  He was initially treated with antibiotics by his primary care physician.  He had minimum improvement in symptoms.  Chest x-ray showed COPD changes without acute process.  SARS-CoV-2 testing was negative.  He was seen in our office and given doxycycline and a prednisone burst.  He had slight improvement in symptoms but continues to have an ongoing cough.  Patient has been  On lisinopril, Says his PCP changed him to valsartan .  He was seen on 7/8 for virtual visit with ongoing congestion and given a Zpack .  Today patient says he continues to cough and wheeze. Coughs up white /brown mucus .   Begin seen by GI  for Gastritis , ulcers. On PPI Twice daily .  Has some intermittent dysphagia .      Allergies  Allergen Reactions  . Penicillins Rash    Immunization History  Administered Date(s) Administered  . Influenza, High Dose Seasonal PF 04/09/2017, 04/22/2018  . Tdap 02/04/2018    Past Medical History:  Diagnosis Date  . Arthritis   . Asthma    Childhood  . Diabetes ([place])   . Hypertension     Tobacco History: Social History   Tobacco Use  Smoking Status Former Smoker  . Packs/day: 2.00  . Years: 33.00  . Pack years: 66.00  . Types: Cigarettes  . Quit date: 01/22/1993  . Years since quitting: 26.0  Smokeless Tobacco Never Used   Counseling given: Not Answered   Outpatient Medications Prior to Visit  Medication Sig Dispense Refill  . albuterol (VENTOLIN HFA) 108 (90 Base) MCG/ACT inhaler     . ALPRAZolam (XANAX) 1 MG tablet Take 1 mg by mouth 3 (three) times daily.     Marland Kitchen amLODipine (NORVASC) 5 MG tablet Take 1 tablet (5 mg total) by mouth daily. 30 tablet 6  . benzonatate (TESSALON) 200 MG capsule Take 1 capsule (200 mg total) by mouth 3 (three) times daily as needed for cough. 30 capsule 1  . buprenorphine (SUBUTEX) 2 MG SUBL SL tablet Place 2 mg under the tongue 2 (two) times daily.   1  . fluticasone (FLONASE) 50 MCG/ACT nasal spray Place 2 sprays into both nostrils daily.     Marland Kitchen gabapentin (NEURONTIN) 400 MG capsule Take 400 mg by mouth every 8 (  eight) hours.   1  . glyBURIDE (DIABETA) 5 MG tablet Take 10 mg by mouth 2 (two) times daily with a meal.     . hydrALAZINE (APRESOLINE) 25 MG tablet Take 25 mg by mouth 2 (two) times daily.    . metFORMIN (GLUCOPHAGE) 1000 MG tablet Take 1,000 mg by mouth 2 (two) times daily with a meal.     . Multiple Vitamin (MULTIVITAMIN WITH MINERALS) TABS tablet Take 1 tablet by mouth daily.    . multivitamin (VIT W/EXTRA C) CHEW chewable tablet Chew 1 tablet by mouth daily.    . Omega-3 Fatty Acids (FISH OIL) 1000 MG CAPS Take 1,000 mg  by mouth 2 (two) times a day.    . pantoprazole (PROTONIX) 40 MG tablet Take 1 tablet (40 mg total) by mouth 2 (two) times daily before a meal. (Patient taking differently: Take 40 mg by mouth 2 (two) times daily. ) 180 tablet 3  . vitamin B-12 (CYANOCOBALAMIN) 500 MCG tablet Take 500 mcg by mouth daily.    . metoprolol tartrate (LOPRESSOR) 25 MG tablet Take 1 tablet (25 mg total) by mouth 2 (two) times daily. 60 tablet 6  . rosuvastatin (CRESTOR) 20 MG tablet Take 1 tablet (20 mg total) by mouth daily. 30 tablet 6  . valsartan (DIOVAN) 80 MG tablet Take 80 mg by mouth daily.    Marland Kitchen doxycycline (VIBRA-TABS) 100 MG tablet Take 1 tablet (100 mg total) by mouth 2 (two) times daily. 14 tablet 0  . lisinopril (PRINIVIL,ZESTRIL) 10 MG tablet Take 10 mg by mouth daily.     No facility-administered medications prior to visit.      Review of Systems:   Constitutional:   No  weight loss, night sweats,  Fevers, chills, fatigue, or  lassitude.  HEENT:   No headaches,  Difficulty swallowing,  Tooth/dental problems, or  Sore throat,                No sneezing, itching, ear ache, nasal congestion, post nasal drip,   CV:  No chest pain,  Orthopnea, PND, swelling in lower extremities, anasarca, dizziness, palpitations, syncope.   GI  No heartburn, indigestion, abdominal pain, nausea, vomiting, diarrhea, change in bowel habits, loss of appetite, bloody stools.   Resp: No shortness of breath with exertion or at rest.  No excess mucus, no productive cough,  No non-productive cough,  No coughing up of blood.  No change in color of mucus.  No wheezing.  No chest wall deformity  Skin: no rash or lesions.  GU: no dysuria, change in color of urine, no urgency or frequency.  No flank pain, no hematuria   MS:  No joint pain or swelling.  No decreased range of motion.  No back pain.    Physical Exam  BP 126/72   Pulse (!) 55   Temp 98.1 F (36.7 C) (Oral)   Ht 5\' 5"  (1.651 m)   Wt 160 lb (72.6 kg)   SpO2  97%   BMI 26.63 kg/m   GEN: A/Ox3; pleasant , NAD, well nourished    HEENT:  [place]/AT,  EACs-clear, TMs-wnl, NOSE-clear, THROAT-clear, no lesions, no postnasal drip or exudate noted.   NECK:  Supple w/ fair ROM; no JVD; normal carotid impulses w/o bruits; no thyromegaly or nodules palpated; no lymphadenopathy.    RESP  Clear  P & A; w/o, wheezes/ rales/ or rhonchi. no accessory muscle use, no dullness to percussion  CARD:  RRR, no m/r/g, no peripheral  edema, pulses intact, no cyanosis or clubbing.  GI:   Soft & nt; nml bowel sounds; no organomegaly or masses detected.   Musco: Warm bil, no deformities or joint swelling noted.   Neuro: alert, no focal deficits noted.    Skin: Warm, no lesions or rashes    Lab Results:  CBC    Component Value Date/Time   WBC 8.9 10/28/2018 1139   RBC 4.44 10/28/2018 1139   HGB 11.5 (L) 10/28/2018 1139   HCT 35.3 (L) 10/28/2018 1139   PLT 450 (H) 10/28/2018 1139   MCV 79.5 (L) 10/28/2018 1139   MCH 25.9 (L) 10/28/2018 1139   MCHC 32.6 10/28/2018 1139   RDW 15.1 (H) 10/28/2018 1139    BMET    Component Value Date/Time   NA 136 08/26/2018 1351   K 3.7 08/26/2018 1351   CL 102 08/26/2018 1351   CO2 23 08/26/2018 1351   GLUCOSE 203 (H) 08/26/2018 1351   BUN 9 08/26/2018 1351   CREATININE 0.81 08/26/2018 1351   CALCIUM 9.0 08/26/2018 1351   GFRNONAA >60 08/26/2018 1351   GFRAA >60 08/26/2018 1351    BNP No results found for: BNP  ProBNP No results found for: PROBNP  Imaging: Dg Chest 2 View  Result Date: 01/03/2019 CLINICAL DATA:  Cough EXAM: CHEST - 2 VIEW COMPARISON:  None. FINDINGS: There are scattered small calcified granulomas. No edema or consolidation. Heart size and pulmonary vascularity are normal. No adenopathy. There is calcification in the left anterior descending coronary artery. There is aortic atherosclerosis. No bone lesions. IMPRESSION: No edema or consolidation.  Occasional tiny granulomas. Heart size normal.  Foci of coronary artery calcification noted. Aortic Atherosclerosis (ICD10-I70.0). Electronically Signed   By: Lowella Grip III M.D.   On: 01/03/2019 09:35      PFT Results Latest Ref Rng & Units 04/17/2018  FVC-Pre L 3.02  FVC-Predicted Pre % 87  FVC-Post L 3.20  FVC-Predicted Post % 92  Pre FEV1/FVC % % 82  Post FEV1/FCV % % 77  FEV1-Pre L 2.49  FEV1-Predicted Pre % 100  FEV1-Post L 2.45  DLCO UNC% % 69  DLCO COR %Predicted % 86  TLC L 7.59  TLC % Predicted % 125  RV % Predicted % 199    No results found for: NITRICOXIDE      Assessment & Plan:   No problem-specific Assessment & Plan notes found for this encounter.     Rexene Edison, NP 01/24/2019

## 2019-01-24 NOTE — Assessment & Plan Note (Addendum)
Slow to resolve COPD exacerbation with ongoing cough Check CT sinus and chest  Check sputum cx and sputum afb  Cough control regimen.  Continue on GERD regimen. Continue follow-up with GI as may need a modified barium swallow  Trial of Symbicort  Plan  Patient Instructions  Mucinex  DM cough syrup Twice daily  As needed  Cough/congestion  Tessalon Three times a day  As needed  Cough  Increase Duoneb Three times a day   Begin Symbicort 80 2  puffs Twice daily  , rinse well after use.  Add flutter valve Three times a day  .  Sputum culture .  Check CT chest and sinus .  Albuterol inhaler 2 puffs every 4hrs as needed.  Continue on Protonix Twice daily  .  GERD diet  Aspirations precautions .  Follow up with Dr. Vaughan Browner or Parrett NP in 2-3  weeks and As needed   Please contact office for sooner follow up if symptoms do not improve or worsen or seek emergency care

## 2019-01-25 ENCOUNTER — Emergency Department (HOSPITAL_COMMUNITY): Payer: Medicare Other

## 2019-01-25 ENCOUNTER — Other Ambulatory Visit: Payer: Self-pay

## 2019-01-25 ENCOUNTER — Encounter (HOSPITAL_COMMUNITY): Payer: Self-pay | Admitting: Emergency Medicine

## 2019-01-25 ENCOUNTER — Inpatient Hospital Stay (HOSPITAL_COMMUNITY)
Admission: EM | Admit: 2019-01-25 | Discharge: 2019-01-28 | DRG: 192 | Disposition: A | Discharge status: Home / Self Care | Payer: Medicare Other | Attending: Family Medicine | Admitting: Family Medicine

## 2019-01-25 DIAGNOSIS — R911 Solitary pulmonary nodule: Secondary | ICD-10-CM | POA: Diagnosis present

## 2019-01-25 DIAGNOSIS — F0390 Unspecified dementia without behavioral disturbance: Secondary | ICD-10-CM | POA: Diagnosis present

## 2019-01-25 DIAGNOSIS — I10 Essential (primary) hypertension: Secondary | ICD-10-CM | POA: Diagnosis present

## 2019-01-25 DIAGNOSIS — Z7984 Long term (current) use of oral hypoglycemic drugs: Secondary | ICD-10-CM | POA: Diagnosis not present

## 2019-01-25 DIAGNOSIS — G8929 Other chronic pain: Secondary | ICD-10-CM | POA: Diagnosis present

## 2019-01-25 DIAGNOSIS — K2970 Gastritis, unspecified, without bleeding: Secondary | ICD-10-CM | POA: Diagnosis present

## 2019-01-25 DIAGNOSIS — Z20828 Contact with and (suspected) exposure to other viral communicable diseases: Secondary | ICD-10-CM | POA: Diagnosis present

## 2019-01-25 DIAGNOSIS — Z79899 Other long term (current) drug therapy: Secondary | ICD-10-CM

## 2019-01-25 DIAGNOSIS — E119 Type 2 diabetes mellitus without complications: Secondary | ICD-10-CM | POA: Diagnosis present

## 2019-01-25 DIAGNOSIS — J441 Chronic obstructive pulmonary disease with (acute) exacerbation: Secondary | ICD-10-CM | POA: Diagnosis not present

## 2019-01-25 DIAGNOSIS — K219 Gastro-esophageal reflux disease without esophagitis: Secondary | ICD-10-CM | POA: Diagnosis present

## 2019-01-25 DIAGNOSIS — E785 Hyperlipidemia, unspecified: Secondary | ICD-10-CM | POA: Diagnosis present

## 2019-01-25 DIAGNOSIS — R1310 Dysphagia, unspecified: Secondary | ICD-10-CM | POA: Diagnosis present

## 2019-01-25 DIAGNOSIS — R0902 Hypoxemia: Secondary | ICD-10-CM | POA: Diagnosis not present

## 2019-01-25 DIAGNOSIS — Z87891 Personal history of nicotine dependence: Secondary | ICD-10-CM

## 2019-01-25 DIAGNOSIS — Z791 Long term (current) use of non-steroidal anti-inflammatories (NSAID): Secondary | ICD-10-CM

## 2019-01-25 DIAGNOSIS — Z880 Allergy status to penicillin: Secondary | ICD-10-CM

## 2019-01-25 DIAGNOSIS — K259 Gastric ulcer, unspecified as acute or chronic, without hemorrhage or perforation: Secondary | ICD-10-CM | POA: Diagnosis present

## 2019-01-25 DIAGNOSIS — Z821 Family history of blindness and visual loss: Secondary | ICD-10-CM

## 2019-01-25 DIAGNOSIS — Z7952 Long term (current) use of systemic steroids: Secondary | ICD-10-CM | POA: Diagnosis not present

## 2019-01-25 DIAGNOSIS — F419 Anxiety disorder, unspecified: Secondary | ICD-10-CM | POA: Diagnosis present

## 2019-01-25 DIAGNOSIS — R0602 Shortness of breath: Secondary | ICD-10-CM | POA: Diagnosis not present

## 2019-01-25 LAB — LACTIC ACID, PLASMA
Lactic Acid, Venous: 2.4 mmol/L (ref 0.5–1.9)
Lactic Acid, Venous: 3.2 mmol/L (ref 0.5–1.9)
Lactic Acid, Venous: 3.6 mmol/L (ref 0.5–1.9)

## 2019-01-25 LAB — URINALYSIS, ROUTINE W REFLEX MICROSCOPIC
Bilirubin Urine: NEGATIVE
Glucose, UA: 50 mg/dL — AB
Hgb urine dipstick: NEGATIVE
Ketones, ur: 5 mg/dL — AB
Leukocytes,Ua: NEGATIVE
Nitrite: NEGATIVE
Protein, ur: NEGATIVE mg/dL
Specific Gravity, Urine: 1.008 (ref 1.005–1.030)
pH: 6 (ref 5.0–8.0)

## 2019-01-25 LAB — CBC
HCT: 37.4 % — ABNORMAL LOW (ref 39.0–52.0)
Hemoglobin: 12.4 g/dL — ABNORMAL LOW (ref 13.0–17.0)
MCH: 27.6 pg (ref 26.0–34.0)
MCHC: 33.2 g/dL (ref 30.0–36.0)
MCV: 83.1 fL (ref 80.0–100.0)
Platelets: 283 10*3/uL (ref 150–400)
RBC: 4.5 MIL/uL (ref 4.22–5.81)
RDW: 14.8 % (ref 11.5–15.5)
WBC: 13.7 10*3/uL — ABNORMAL HIGH (ref 4.0–10.5)
nRBC: 0 % (ref 0.0–0.2)

## 2019-01-25 LAB — PROTIME-INR
INR: 1.1 (ref 0.8–1.2)
Prothrombin Time: 13.6 seconds (ref 11.4–15.2)

## 2019-01-25 LAB — BASIC METABOLIC PANEL
Anion gap: 15 (ref 5–15)
BUN: 14 mg/dL (ref 8–23)
CO2: 27 mmol/L (ref 22–32)
Calcium: 10 mg/dL (ref 8.9–10.3)
Chloride: 88 mmol/L — ABNORMAL LOW (ref 98–111)
Creatinine, Ser: 0.95 mg/dL (ref 0.61–1.24)
GFR calc Af Amer: >60 mL/min (ref >60)
GFR calc non Af Amer: >60 mL/min (ref >60)
Glucose, Bld: 171 mg/dL — ABNORMAL HIGH (ref 70–99)
Potassium: 4 mmol/L (ref 3.5–5.1)
Sodium: 130 mmol/L — ABNORMAL LOW (ref 135–145)

## 2019-01-25 LAB — SARS CORONAVIRUS 2 BY RT PCR (HOSPITAL ORDER, PERFORMED IN ~~LOC~~ HOSPITAL LAB): SARS Coronavirus 2: NEGATIVE

## 2019-01-25 LAB — TROPONIN I (HIGH SENSITIVITY)
Troponin I (High Sensitivity): 5 ng/L (ref <18)
Troponin I (High Sensitivity): 6 ng/L (ref <18)

## 2019-01-25 LAB — APTT: aPTT: 30 seconds (ref 24–36)

## 2019-01-25 LAB — GLUCOSE, CAPILLARY: Glucose-Capillary: 315 mg/dL — ABNORMAL HIGH (ref 70–99)

## 2019-01-25 MED ORDER — ENOXAPARIN SODIUM 40 MG/0.4ML ~~LOC~~ SOLN
40.0000 mg | SUBCUTANEOUS | Status: DC
  Administered 2019-01-25 – 2019-01-27 (×3): 40 mg via SUBCUTANEOUS
  Filled 2019-01-25 (×3): qty 0.4

## 2019-01-25 MED ORDER — ROSUVASTATIN CALCIUM 20 MG PO TABS
20.0000 mg | ORAL_TABLET | Freq: Every day | ORAL | Status: DC
  Administered 2019-01-26 – 2019-01-28 (×3): 20 mg via ORAL
  Filled 2019-01-25 (×3): qty 1

## 2019-01-25 MED ORDER — SODIUM CHLORIDE 0.9 % IV BOLUS
1000.0000 mL | Freq: Once | INTRAVENOUS | Status: AC
  Administered 2019-01-26: 1000 mL via INTRAVENOUS

## 2019-01-25 MED ORDER — SODIUM CHLORIDE 0.9 % IV BOLUS
250.0000 mL | Freq: Once | INTRAVENOUS | Status: AC
  Administered 2019-01-25: 250 mL via INTRAVENOUS

## 2019-01-25 MED ORDER — LEVOFLOXACIN 500 MG PO TABS
500.0000 mg | ORAL_TABLET | Freq: Every day | ORAL | Status: DC
  Filled 2019-01-25: qty 1

## 2019-01-25 MED ORDER — AMLODIPINE BESYLATE 5 MG PO TABS: 5.0000 mg | ORAL_TABLET | Freq: Every day | ORAL | Status: DC

## 2019-01-25 MED ORDER — PREDNISONE 20 MG PO TABS
40.0000 mg | ORAL_TABLET | Freq: Every day | ORAL | Status: DC
  Administered 2019-01-26 – 2019-01-28 (×3): 40 mg via ORAL
  Filled 2019-01-25 (×3): qty 2

## 2019-01-25 MED ORDER — AMOXICILLIN-POT CLAVULANATE 500-125 MG PO TABS: 500.0000 mg | ORAL_TABLET | Freq: Two times a day (BID) | ORAL | Status: DC

## 2019-01-25 MED ORDER — IPRATROPIUM-ALBUTEROL 0.5-2.5 (3) MG/3ML IN SOLN
3.0000 mL | Freq: Three times a day (TID) | RESPIRATORY_TRACT | Status: DC
  Administered 2019-01-26 – 2019-01-28 (×8): 3 mL via RESPIRATORY_TRACT
  Filled 2019-01-25 (×8): qty 3

## 2019-01-25 MED ORDER — ALPRAZOLAM 0.5 MG PO TABS
1.0000 mg | ORAL_TABLET | Freq: Three times a day (TID) | ORAL | Status: DC
  Administered 2019-01-25 – 2019-01-28 (×8): 1 mg via ORAL
  Filled 2019-01-25 (×8): qty 2

## 2019-01-25 MED ORDER — IPRATROPIUM-ALBUTEROL 0.5-2.5 (3) MG/3ML IN SOLN
3.0000 mL | RESPIRATORY_TRACT | Status: DC
  Administered 2019-01-25: 3 mL via RESPIRATORY_TRACT
  Filled 2019-01-25: qty 3

## 2019-01-25 MED ORDER — SODIUM CHLORIDE 0.9 % IV SOLN
3.0000 g | Freq: Three times a day (TID) | INTRAVENOUS | Status: DC
  Administered 2019-01-25 – 2019-01-26 (×2): 3 g via INTRAVENOUS
  Filled 2019-01-25 (×2): qty 8
  Filled 2019-01-25 (×2): qty 3

## 2019-01-25 MED ORDER — MOMETASONE FURO-FORMOTEROL FUM 200-5 MCG/ACT IN AERO
2.0000 | INHALATION_SPRAY | Freq: Two times a day (BID) | RESPIRATORY_TRACT | Status: DC
  Administered 2019-01-25 – 2019-01-28 (×6): 2 via RESPIRATORY_TRACT
  Filled 2019-01-25: qty 8.8

## 2019-01-25 MED ORDER — ALBUTEROL SULFATE (2.5 MG/3ML) 0.083% IN NEBU: 2.5000 mg | INHALATION_SOLUTION | RESPIRATORY_TRACT | Status: DC | PRN

## 2019-01-25 MED ORDER — AEROCHAMBER PLUS FLO-VU SMALL MISC: 1.0000 | Freq: Once | Status: DC

## 2019-01-25 MED ORDER — METHYLPREDNISOLONE SODIUM SUCC 125 MG IJ SOLR
125.0000 mg | Freq: Once | INTRAMUSCULAR | Status: AC
  Administered 2019-01-25: 125 mg via INTRAVENOUS
  Filled 2019-01-25: qty 2

## 2019-01-25 MED ORDER — POLYETHYLENE GLYCOL 3350 17 G PO PACK: 17.0000 g | PACK | Freq: Every day | ORAL | Status: DC | PRN

## 2019-01-25 MED ORDER — IPRATROPIUM BROMIDE HFA 17 MCG/ACT IN AERS
6.0000 | INHALATION_SPRAY | Freq: Once | RESPIRATORY_TRACT | Status: AC
  Administered 2019-01-25: 6 via RESPIRATORY_TRACT
  Filled 2019-01-25: qty 12.9

## 2019-01-25 MED ORDER — INSULIN ASPART 100 UNIT/ML ~~LOC~~ SOLN
0.0000 [IU] | Freq: Every day | SUBCUTANEOUS | Status: DC
  Administered 2019-01-25 – 2019-01-27 (×3): 4 [IU] via SUBCUTANEOUS

## 2019-01-25 MED ORDER — DIPHENHYDRAMINE HCL 25 MG PO CAPS: 25.0000 mg | ORAL_CAPSULE | Freq: Four times a day (QID) | ORAL | Status: DC | PRN

## 2019-01-25 MED ORDER — BUPRENORPHINE HCL 2 MG SL SUBL
4.0000 mg | SUBLINGUAL_TABLET | Freq: Two times a day (BID) | SUBLINGUAL | Status: DC | PRN
  Administered 2019-01-26 – 2019-01-28 (×3): 4 mg via SUBLINGUAL
  Filled 2019-01-25 (×3): qty 2

## 2019-01-25 MED ORDER — IRBESARTAN 75 MG PO TABS
75.0000 mg | ORAL_TABLET | Freq: Every day | ORAL | Status: DC
  Administered 2019-01-26 – 2019-01-28 (×3): 75 mg via ORAL
  Filled 2019-01-25 (×3): qty 1

## 2019-01-25 MED ORDER — IPRATROPIUM-ALBUTEROL 0.5-2.5 (3) MG/3ML IN SOLN
3.0000 mL | RESPIRATORY_TRACT | Status: DC | PRN
  Administered 2019-01-26: 3 mL via RESPIRATORY_TRACT
  Filled 2019-01-25: qty 3

## 2019-01-25 MED ORDER — DOXYCYCLINE HYCLATE 100 MG PO TABS
100.0000 mg | ORAL_TABLET | Freq: Two times a day (BID) | ORAL | Status: DC
  Filled 2019-01-25: qty 1

## 2019-01-25 MED ORDER — SODIUM CHLORIDE 0.9% FLUSH
3.0000 mL | Freq: Once | INTRAVENOUS | Status: AC
  Administered 2019-01-25: 3 mL via INTRAVENOUS

## 2019-01-25 MED ORDER — INSULIN ASPART 100 UNIT/ML ~~LOC~~ SOLN: 0.0000 [IU] | Freq: Three times a day (TID) | SUBCUTANEOUS | Status: DC

## 2019-01-25 MED ORDER — SODIUM CHLORIDE 0.9 % IV BOLUS
500.0000 mL | Freq: Once | INTRAVENOUS | Status: AC
  Administered 2019-01-25: 500 mL via INTRAVENOUS

## 2019-01-25 MED ORDER — ADULT MULTIVITAMIN W/MINERALS CH
1.0000 | ORAL_TABLET | Freq: Every day | ORAL | Status: DC
  Administered 2019-01-25 – 2019-01-28 (×4): 1 via ORAL
  Filled 2019-01-25 (×4): qty 1

## 2019-01-25 MED ORDER — INSULIN ASPART 100 UNIT/ML ~~LOC~~ SOLN
0.0000 [IU] | Freq: Three times a day (TID) | SUBCUTANEOUS | Status: DC
  Administered 2019-01-26 (×2): 5 [IU] via SUBCUTANEOUS
  Administered 2019-01-27: 9 [IU] via SUBCUTANEOUS
  Administered 2019-01-27: 7 [IU] via SUBCUTANEOUS
  Administered 2019-01-27: 3 [IU] via SUBCUTANEOUS
  Administered 2019-01-28: 10 [IU] via SUBCUTANEOUS
  Administered 2019-01-28: 3 [IU] via SUBCUTANEOUS

## 2019-01-25 MED ORDER — METOPROLOL TARTRATE 25 MG PO TABS
25.0000 mg | ORAL_TABLET | Freq: Two times a day (BID) | ORAL | Status: DC
  Administered 2019-01-25: 25 mg via ORAL
  Filled 2019-01-25: qty 1

## 2019-01-25 MED ORDER — ALBUTEROL SULFATE HFA 108 (90 BASE) MCG/ACT IN AERS
8.0000 | INHALATION_SPRAY | Freq: Once | RESPIRATORY_TRACT | Status: AC
  Administered 2019-01-25: 8 via RESPIRATORY_TRACT
  Filled 2019-01-25: qty 6.7

## 2019-01-25 MED ORDER — PANTOPRAZOLE SODIUM 40 MG PO TBEC
40.0000 mg | DELAYED_RELEASE_TABLET | Freq: Two times a day (BID) | ORAL | Status: DC
  Administered 2019-01-25 – 2019-01-28 (×6): 40 mg via ORAL
  Filled 2019-01-25 (×6): qty 1

## 2019-01-25 MED ORDER — NON FORMULARY: Freq: Two times a day (BID) | Status: DC

## 2019-01-25 MED ORDER — GABAPENTIN 400 MG PO CAPS
400.0000 mg | ORAL_CAPSULE | Freq: Three times a day (TID) | ORAL | Status: DC
  Administered 2019-01-25 – 2019-01-28 (×9): 400 mg via ORAL
  Filled 2019-01-25 (×9): qty 1

## 2019-01-25 NOTE — Progress Notes (Signed)
Pharmacy Antibiotic Note  Japheth Diekman is a 73 y.o. male admitted on 01/25/2019 with pneumonia.  Pharmacy has been consulted for Unasyn dosing.  Plan: Unasyn 3g IV every 8 hours Monitor renal function, clinical progression and LOT     Temp (24hrs), Avg:99.5 F (37.5 C), Min:98 F (36.7 C), Max:101.4 F (38.6 C)  Recent Labs  Lab 01/25/19 1428 01/25/19 1546 01/25/19 1828  WBC 13.7*  --   --   CREATININE 0.95  --   --   LATICACIDVEN  --  2.4* 3.2*    Estimated Creatinine Clearance: 58.4 mL/min (by C-G formula based on SCr of 0.95 mg/dL).    Allergies  Allergen Reactions  . Penicillins Rash   Bertis Ruddy, PharmD Clinical Pharmacist Please check AMION for all [place] numbers 01/25/2019 9:46 PM

## 2019-01-25 NOTE — Progress Notes (Signed)
Notified provider of need to order antibiotics.   

## 2019-01-25 NOTE — ED Notes (Signed)
Attempted to call report x 1  

## 2019-01-25 NOTE — Progress Notes (Signed)
Lab called Lactic acid 3.6

## 2019-01-25 NOTE — H&P (Addendum)
[facility] Admission History and Physical Service Pager: [phone]  Patient name: Travis Beck Medical record number: [id number] Date of birth: 1946-03-08 Age: 73 y.o. Gender: male  Primary Care Provider: Monico Blitz, MD Consultants: Fatima Sanger Code Status: Full  Preferred Emergency Contact:   Chief Complaint: Persistent cough   Assessment and Plan: Remy Voiles is a 73 y.o. male presenting with dyspnea and productive cough . PMH is significant for COPD, pulmonary nodule, DM chronic pain (back, knees and shoulders)  Shortness of breath:  This pt saw his pulmonologist yesterday for persistent cough and also for further investigation of a pulmonary nodule found on CT chest. He was treated with short burst of prednisone and 14 day prescription of doxycycline.The differentials for this patient are a COPD exacerbation +/- infection, URTI, pneumonia, CHF and GERD. Factors suggesting COPD exacerbation are his long standing smoking history, recent COPD diagnosis and recent visit to the Pulmonologist for similar symptoms. It is also likely given that bilateral diffuse ronchi and wheeze on chest exam, he has not responded to previous antibiotics prescribed to him (Azithromycin and Doxycycline) and now he is requiring oxygen to breath (sats 88% on air, sats 98% on 3.5L). He also felt better after steroid treatment and inhalers in the ER. It could be an infective exacerbation of COPD as the patient has a change in his sputum colour from white to yellow, WBC 13.7 and has been febrile in hospital T 101.4.  Pneumonia could be a possibility but CXR showed clear lung fields. URTI less likely given oxygen requirement and less upper respiratory tract symptoms.  Pt does not have a history of CHF so CHF exacerbation unlikely but can still order echo as inpatient if not improving. Pt has a history of dysphagia and under GI for gastritis which is a less likely cause of his cough.   -Admit to med-surg-Dr Chambliss -Monitor saturations-continous pulse oximetry overnight -Antibiotics-augmentin as patient failed 6/26 doxy course, patient with charted rash to penicillin that he says he thinks was from when he was a child. -Benadryl as needed in case he starts to get an itchy rash -Albuterol nebulisers Q2PRN, Dulera  -Prednisone 40mg  5 day course -Respiratory therapy -Patient education regarding how take inhalers  COPD Recent diagnosis-earlier this year.  Patient has definitely been taking his inhalers wrong, using the Symbicort "whenever he gets real bad and the albuterol twice a day just to make sure everything stays okay Visit to [place] on 7/17 for COPD exacerbation and investigation for pulmonary lung nodule  Home meds: albuterol 2 puffs PRN, symbicort 2 puffs BID, benzonate 200mg  TID for cough  -Albuterol Q2PRN, duler -prednisone 40mg  5 day course  -continuous pulse oximetry  Diabetes  Home meds: Glyburide 10mg  BID, metformin 1g BID CBG: 171 today -Sliding scale   Gastritis, ulcers Seen by GI as outpatient On pantoprazole 40 mg BID  -Continue Pantoprazole   HTN BP today 144/77 Home meds: Amlodipine 5mg  once a day, Valsartan 80mg  once a day, hydralazine 25mg  BID, metoprolol 25mg  BID -continue home meds   HLD  -continue home med: Rosuvastatin 40mg   Nutrition  -continue Vitamin B-12, omega-3 fatty acids  Chronic pain Home meds: Ibuprofen 800mg  PRN, gabapentin 400mg  TID, buprenorphine 4mg  BID -continue   Anxiety  Home meds: Alprazolam 1mg  TID  FEN/GI: Normal diet, Protinix Prophylaxis: Lovenox   Disposition: home after resolution of COPD exacerbation and when not requiring oxygen   History of Present Illness:  Travis Beck is a 73  y.o. male presenting with dyspnea and productive cough.   Pt has had a recently diagnosis of COPD earlier in the year. He has had a 10 day history of cough which has got worse over the last few days. He  was unable to sleep last night because of the coughing. He noticing a change in his sputum colour from white to yellow. No blood in sputum. Reports he has "breathing spells" and is unable l to mow the law due to exercise. Denies chest pain as such but does have some generalized "naggy" pains and thinks he may have had a light heart attack in the past but not in the last month. Sometimes gets dizzy when standing up too fast and gets occasional minor headaches. Vomited X 4 episodes last night, brown colour, no blood in vomit. Denies fevers, sore throat, anosmia, lack of taste, abdominal pain change in bowel habit. Sometimes has reduced flow when passing urine but no dysuria.   Reports seeing Pulmonologist yesterday for the same symptoms who gave a prednisone burs (mild improvement after this but continued to have the cough) and prescribeddoxycycline 14 day course. He had recently been switched from Lisinopril to Valsartan by his PCP and also been given Zpack with minimal improvement.   In the ER his labs were Nah 13.7, Hb 12.4, Na 130, K 4. Creat 0.95, BUN 14, LA 2.4. Vital signs were: T 10.14, BP 151/84. Sats 95% on 3.5L opxygen, HR 84, RR 18. CXR showed no acute abnormalities. No pneumonia or pulmonary edema. He treated as an exacerbation of COPD in ED and given IV methylprednisolone, ipratropium and albuterol inhalers in the ED.  Review Of Systems: Per HPI with the following additions:   Review of Systems  Constitutional: Negative for chills, fever and malaise/fatigue.  Eyes: Negative for blurred vision.  Respiratory: Positive for cough, sputum production and shortness of breath.   Cardiovascular: Positive for chest pain. Negative for palpitations.  Gastrointestinal: Positive for vomiting. Negative for abdominal pain, blood in stool, constipation, diarrhea and melena.  Genitourinary: Negative for dysuria, flank pain, frequency, hematuria and urgency.  Neurological: Positive for dizziness.     Patient Active Problem List   Diagnosis Date Noted  . GERD (gastroesophageal reflux disease) 01/24/2019  . Hematemesis without nausea 08/20/2018  . Melena 08/20/2018  . Chronic bronchitis with emphysema ([place]) 05/07/2018  . Abnormal CT scan of lung 01/22/2018  . Pulmonary emphysema ([place]) 01/22/2018    Past Medical History: Past Medical History:  Diagnosis Date  . Arthritis   . Asthma    Childhood  . Diabetes ([place])   . Hypertension     Past Surgical History: Past Surgical History:  Procedure Laterality Date  . BIOPSY  08/30/2018   Procedure: BIOPSY;  Surgeon: Rogene Houston, MD;  Location: [facility];  Service: Endoscopy;;  gastric   . COLONOSCOPY    . ESOPHAGOGASTRODUODENOSCOPY (EGD) WITH PROPOFOL N/A 08/30/2018   Procedure: ESOPHAGOGASTRODUODENOSCOPY (EGD) WITH PROPOFOL;  Surgeon: Rogene Houston, MD;  Location: [facility];  Service: Endoscopy;  Laterality: N/A;  1:30  . HERNIA REPAIR     bilateral lower abdomen    Social History: Social History   Tobacco Use  . Smoking status: Former Smoker    Packs/day: 2.00    Years: 33.00    Pack years: 66.00    Types: Cigarettes    Quit date: 01/22/1993    Years since quitting: 26.0  . Smokeless tobacco: Never Used  Substance Use Topics  .  Alcohol use: Not Currently  . Drug use: Never   Additional social history:  Lives with brother who is blind He is his main carer  Please also refer to relevant sections of EMR.  Family History: No family history on file.   Allergies and Medications: Allergies  Allergen Reactions  . Penicillins Rash   No current facility-administered medications on file prior to encounter.    Current Outpatient Medications on File Prior to Encounter  Medication Sig Dispense Refill  . albuterol (VENTOLIN HFA) 108 (90 Base) MCG/ACT inhaler Inhale 2 puffs into the lungs every 6 (six) hours as needed for wheezing or shortness of breath.     . ALPRAZolam (XANAX) 1 MG tablet Take 1 mg by  mouth 3 (three) times daily.     Marland Kitchen amLODipine (NORVASC) 5 MG tablet Take 1 tablet (5 mg total) by mouth daily. 30 tablet 6  . benzonatate (TESSALON) 200 MG capsule Take 1 capsule (200 mg total) by mouth 3 (three) times daily as needed for cough. 30 capsule 1  . budesonide-formoterol (SYMBICORT) 80-4.5 MCG/ACT inhaler Inhale 2 puffs into the lungs 2 (two) times daily. 1 Inhaler 0  . buprenorphine (SUBUTEX) 8 MG SUBL SL tablet Place 4 mg under the tongue 2 (two) times daily as needed for pain.    . fluticasone (FLONASE) 50 MCG/ACT nasal spray Place 2 sprays into both nostrils daily.     Marland Kitchen gabapentin (NEURONTIN) 400 MG capsule Take 400 mg by mouth every 8 (eight) hours.   1  . glyBURIDE (DIABETA) 5 MG tablet Take 10 mg by mouth 2 (two) times daily with a meal.     . hydrALAZINE (APRESOLINE) 25 MG tablet Take 25 mg by mouth 2 (two) times daily.    Marland Kitchen ibuprofen (ADVIL) 800 MG tablet Take 800 mg by mouth every 12 (twelve) hours as needed for pain.    . metFORMIN (GLUCOPHAGE) 1000 MG tablet Take 1,000 mg by mouth 2 (two) times daily with a meal.     . metoprolol tartrate (LOPRESSOR) 25 MG tablet Take 1 tablet (25 mg total) by mouth 2 (two) times daily. 60 tablet 6  . Multiple Vitamin (MULTIVITAMIN WITH MINERALS) TABS tablet Take 1 tablet by mouth daily.    . multivitamin (VIT W/EXTRA C) CHEW chewable tablet Chew 1 tablet by mouth daily.    . Omega-3 Fatty Acids (FISH OIL) 1000 MG CAPS Take 1,000 mg by mouth 2 (two) times a day.    . pantoprazole (PROTONIX) 40 MG tablet Take 1 tablet (40 mg total) by mouth 2 (two) times daily before a meal. (Patient taking differently: Take 40 mg by mouth 2 (two) times daily. ) 180 tablet 3  . rosuvastatin (CRESTOR) 20 MG tablet Take 1 tablet (20 mg total) by mouth daily. 30 tablet 6  . valsartan (DIOVAN) 80 MG tablet Take 80 mg by mouth daily.    . vitamin B-12 (CYANOCOBALAMIN) 500 MCG tablet Take 500 mcg by mouth daily.      Objective: BP 121/90   Pulse 84   Temp  (!) 101.4 F (38.6 C) (Rectal)   Resp 18   SpO2 95%  Exam: General: cheerful, speaking in full sentences, [place] in 3.5L oxygen, no acute distress Eyes: normal extra occular eye movements, moist mucous membranes, normal conjunctiva and sclera ENTM: no thyromegaly or thyroid cyst, no lymphadenopathy, normal pharynx, no erythema or exudate of posterior pharynx  Neck: supple neck Cardiovascular: S1 and S2, no S3 and S4. No rubs or  gallops. Normal cap refill  Respiratory: bibasal ronchi and wheeze throughout lung fields, no wheeze. Normal respiratory effort  Gastrointestinal: abdominal soft non tender, no organomegaly, bowel sounds present MSK: moving all 4 limbs, no peripheral edema, calves soft non tender  Derm: normal skin turgor, no rashes or bruises Neuro: cranial nerves grossly in tact. PEARL 37mm bilaterally Psych: normal mood and normal affect  Labs and Imaging: CBC BMET  Recent Labs  Lab 01/25/19 1428  WBC 13.7*  HGB 12.4*  HCT 37.4*  PLT 283   Recent Labs  Lab 01/25/19 1428  NA 130*  K 4.0  CL 88*  CO2 27  BUN 14  CREATININE 0.95  GLUCOSE 171*  CALCIUM 10.0     EKG: sinus rhythm, no acute changes   Dg Chest Portable 1 View  Result Date: 01/25/2019 CLINICAL DATA:  Shortness of breath. EXAM: PORTABLE CHEST 1 VIEW COMPARISON:  Radiographs of January 03, 2019. FINDINGS: The heart size and mediastinal contours are within normal limits. Both lungs are clear. No pneumothorax or pleural effusion is noted. The visualized skeletal structures are unremarkable. IMPRESSION: No active disease. Electronically Signed   By: Marijo Conception M.D.   On: 01/25/2019 16:07    Lattie Haw, MD 01/25/2019, 5:03 PM PGY-1, [facility] Intern pager: [phone], text pages welcome   FPTS Upper-Level Resident Addendum   I have independently interviewed and examined the patient. I have discussed the above with the original author and agree with their documentation. My edits  for correction/addition/clarification are in blue. Please see also any attending notes.    Sherene Sires, DO PGY-2, [facility] 01/25/2019 8:47 PM  [facility] pager: [phone] (text pages welcome through [facility])

## 2019-01-25 NOTE — ED Provider Notes (Signed)
[place] [facility] EMERGENCY DEPARTMENT Provider Note   CSN: [id number] Arrival date & time: 01/25/19  1421    History   Chief Complaint Chief Complaint  Patient presents with  . Shortness of Breath    HPI Travis Beck is a 73 y.o. male with h/o HLD, DM, emphysema and chronic bronchitis, prior tobacco use, pulmonary nodule, chronic pain presents to the ER for evaluation of shortness of breath ongoing for the last 2 to 3 days, slightly worse for the last few days.  This is exertional.  It is associated with persistent sputum production of the same color and amount, inspiratory and expiratory wheezing, phlegm in his chest.  He uses breathing inhalers twice daily and this has not provided relief.  Last night he started having nausea, nonbilious nonbloody emesis 6-8 times and again this morning around 5 AM.  This concerned him which prompted ER visit.  Currently has mild, lingering nausea. He went to his pulmonologist yesterday for ongoing cough.  Has been on several antibiotics and prednisone for cough/COPD with minimal improvement in his symptoms.  COVID test negative.  Symptoms thought to be due to slow to resolve COPD exacerbation.  No fever, chills, congestion, rhinorrhea, chest pain, abdominal pain, diarrhea, constipation, dysuria, hematuria.  No sick contacts.  He lives with his brother who has no symptoms.  Goes to visit her sister in [place] yet, last yesterday.  She lives only 4 miles away.  No recent orthopnea, PND, leg swelling, calf pain.  No history of DVT or PE.  No exposure to suspected COVID 19.   HPI  Past Medical History:  Diagnosis Date  . Arthritis   . Asthma    Childhood  . Diabetes ([place])   . Hypertension     Patient Active Problem List   Diagnosis Date Noted  . COPD exacerbation ([place]) 01/25/2019  . GERD (gastroesophageal reflux disease) 01/24/2019  . Hematemesis without nausea 08/20/2018  . Melena 08/20/2018  . Chronic bronchitis with emphysema  ([place]) 05/07/2018  . Abnormal CT scan of lung 01/22/2018  . Pulmonary emphysema ([place]) 01/22/2018    Past Surgical History:  Procedure Laterality Date  . BIOPSY  08/30/2018   Procedure: BIOPSY;  Surgeon: Rogene Houston, MD;  Location: [facility];  Service: Endoscopy;;  gastric   . COLONOSCOPY    . ESOPHAGOGASTRODUODENOSCOPY (EGD) WITH PROPOFOL N/A 08/30/2018   Procedure: ESOPHAGOGASTRODUODENOSCOPY (EGD) WITH PROPOFOL;  Surgeon: Rogene Houston, MD;  Location: [facility];  Service: Endoscopy;  Laterality: N/A;  1:30  . HERNIA REPAIR     bilateral lower abdomen        Home Medications    Prior to Admission medications   Medication Sig Start Date End Date Taking? Authorizing Provider  albuterol (VENTOLIN HFA) 108 (90 Base) MCG/ACT inhaler Inhale 2 puffs into the lungs every 6 (six) hours as needed for wheezing or shortness of breath.  11/20/18  Yes [provider]  ALPRAZolam Duanne Moron) 1 MG tablet Take 1 mg by mouth 3 (three) times daily.    Yes [provider]  amLODipine (NORVASC) 5 MG tablet Take 1 tablet (5 mg total) by mouth daily. 09/16/18  Yes Herminio Commons, MD  benzonatate (TESSALON) 200 MG capsule Take 1 capsule (200 mg total) by mouth 3 (three) times daily as needed for cough. 01/03/19 01/03/20 Yes Parrett, Tammy S, NP  budesonide-formoterol (SYMBICORT) 80-4.5 MCG/ACT inhaler Inhale 2 puffs into the lungs 2 (two) times daily. 01/24/19  Yes Parrett,  Tammy S, NP  buprenorphine (SUBUTEX) 8 MG SUBL SL tablet Place 4 mg under the tongue 2 (two) times daily as needed for pain. 01/04/19  Yes [provider]  fluticasone (FLONASE) 50 MCG/ACT nasal spray Place 2 sprays into both nostrils daily.  08/22/18  Yes [provider]  gabapentin (NEURONTIN) 400 MG capsule Take 400 mg by mouth every 8 (eight) hours.  01/11/18  Yes [provider]  glyBURIDE (DIABETA) 5 MG tablet Take 10 mg by mouth 2 (two) times daily with a meal.  11/19/14  Yes  [provider]  hydrALAZINE (APRESOLINE) 25 MG tablet Take 25 mg by mouth 2 (two) times daily.   Yes [provider]  ibuprofen (ADVIL) 800 MG tablet Take 800 mg by mouth every 12 (twelve) hours as needed for pain. 01/08/19  Yes [provider]  metFORMIN (GLUCOPHAGE) 1000 MG tablet Take 1,000 mg by mouth 2 (two) times daily with a meal.  12/07/14  Yes [provider]  metoprolol tartrate (LOPRESSOR) 25 MG tablet Take 1 tablet (25 mg total) by mouth 2 (two) times daily. 09/16/18 01/25/19 Yes Herminio Commons, MD  Multiple Vitamin (MULTIVITAMIN WITH MINERALS) TABS tablet Take 1 tablet by mouth daily.   Yes [provider]  multivitamin (VIT W/EXTRA C) CHEW chewable tablet Chew 1 tablet by mouth daily.   Yes [provider]  Omega-3 Fatty Acids (FISH OIL) 1000 MG CAPS Take 1,000 mg by mouth 2 (two) times a day.   Yes [provider]  pantoprazole (PROTONIX) 40 MG tablet Take 1 tablet (40 mg total) by mouth 2 (two) times daily before a meal. Patient taking differently: Take 40 mg by mouth 2 (two) times daily.  08/20/18  Yes Setzer, Terri L, NP  rosuvastatin (CRESTOR) 20 MG tablet Take 1 tablet (20 mg total) by mouth daily. 09/16/18 01/25/19 Yes Herminio Commons, MD  valsartan (DIOVAN) 80 MG tablet Take 80 mg by mouth daily. 01/22/19  Yes [provider]  vitamin B-12 (CYANOCOBALAMIN) 500 MCG tablet Take 500 mcg by mouth daily.   Yes [provider]    Family History No family history on file.  Social History Social History   Tobacco Use  . Smoking status: Former Smoker    Packs/day: 2.00    Years: 33.00    Pack years: 66.00    Types: Cigarettes    Quit date: 01/22/1993    Years since quitting: 26.0  . Smokeless tobacco: Never Used  Substance Use Topics  . Alcohol use: Not Currently  . Drug use: Never     Allergies   Penicillins   Review of Systems Review of Systems  Respiratory: Positive for cough,  shortness of breath and wheezing.   Gastrointestinal: Positive for nausea and vomiting.  All other systems reviewed and are negative.    Physical Exam Updated Vital Signs BP 123/75 (BP Location: Right Arm)   Pulse 94   Temp 98 F (36.7 C) (Oral)   Resp 18   SpO2 92%   Physical Exam Vitals signs and nursing note reviewed.  Constitutional:      General: He is not in acute distress.    Appearance: He is well-developed.     Comments: NAD.  HENT:     Head: Normocephalic and atraumatic.     Right Ear: External ear normal.     Left Ear: External ear normal.     Nose: Nose normal.  Eyes:     General: No scleral  icterus.    Conjunctiva/sclera: Conjunctivae normal.  Neck:     Musculoskeletal: Normal range of motion and neck supple.  Cardiovascular:     Rate and Rhythm: Normal rate and regular rhythm.     Heart sounds: Normal heart sounds. No murmur.     Comments: 1+ radial and DP pulses bilaterally.  No lower extremity edema.  No calf tenderness. Pulmonary:     Effort: Pulmonary effort is normal.     Breath sounds: Wheezing and rhonchi present.     Comments: Audible wheezing inspiratory/expiratory.  SPO2 92% on 2 L [place].  SPO2 dropped to 88% on room air and with minimal movement in the bed.  Diffuse inspiratory and expiratory wheezing/coarse lung sounds in all lung fields.  He is speaking in full sentences.  Wet cough during exam. Musculoskeletal: Normal range of motion.        General: No deformity.  Skin:    General: Skin is warm and dry.     Capillary Refill: Capillary refill takes less than 2 seconds.  Neurological:     Mental Status: He is alert and oriented to person, place, and time.  Psychiatric:        Behavior: Behavior normal.        Thought Content: Thought content normal.        Judgment: Judgment normal.      ED Treatments / Results  Labs (all labs ordered are listed, but only abnormal results are displayed) Labs Reviewed  BASIC METABOLIC PANEL - Abnormal;  Notable for the following components:      Result Value   Sodium 130 (*)    Chloride 88 (*)    Glucose, Bld 171 (*)    All other components within normal limits  CBC - Abnormal; Notable for the following components:   WBC 13.7 (*)    Hemoglobin 12.4 (*)    HCT 37.4 (*)    All other components within normal limits  LACTIC ACID, PLASMA - Abnormal; Notable for the following components:   Lactic Acid, Venous 2.4 (*)    All other components within normal limits  LACTIC ACID, PLASMA - Abnormal; Notable for the following components:   Lactic Acid, Venous 3.2 (*)    All other components within normal limits  URINALYSIS, ROUTINE W REFLEX MICROSCOPIC - Abnormal; Notable for the following components:   Color, Urine STRAW (*)    Glucose, UA 50 (*)    Ketones, ur 5 (*)    All other components within normal limits  SARS CORONAVIRUS 2 (HOSPITAL ORDER, [facility])  CULTURE, BLOOD (ROUTINE X 2)  CULTURE, BLOOD (ROUTINE X 2)  URINE CULTURE  APTT  PROTIME-INR  BASIC METABOLIC PANEL  CBC  TROPONIN I (HIGH SENSITIVITY)  TROPONIN I (HIGH SENSITIVITY)    EKG EKG Interpretation  Date/Time:  Saturday January 25 2019 15:12:54 EDT Ventricular Rate:  83 PR Interval:  164 QRS Duration: 111 QT Interval:  383 QTC Calculation: 450 R Axis:   64 Text Interpretation:  Sinus rhythm Borderline repolarization abnormality Confirmed by Elnora Morrison [phone]) on 01/25/2019 6:10:47 PM   Radiology Dg Chest Portable 1 View  Result Date: 01/25/2019 CLINICAL DATA:  Shortness of breath. EXAM: PORTABLE CHEST 1 VIEW COMPARISON:  Radiographs of January 03, 2019. FINDINGS: The heart size and mediastinal contours are within normal limits. Both lungs are clear. No pneumothorax or pleural effusion is noted. The visualized skeletal structures are unremarkable. IMPRESSION: No active disease. Electronically Signed  By: Marijo Conception M.D.   On: 01/25/2019 16:07    Procedures .Critical Care  Performed by: Kinnie Feil, PA-C Authorized by: Kinnie Feil, PA-C   Critical care provider statement:    Critical care time (minutes):  45   Critical care was necessary to treat or prevent imminent or life-threatening deterioration of the following conditions:  Respiratory failure and sepsis   Critical care was time spent personally by me on the following activities:  Discussions with consultants, evaluation of patient's response to treatment, examination of patient, ordering and performing treatments and interventions, ordering and review of laboratory studies, ordering and review of radiographic studies, pulse oximetry, re-evaluation of patient's condition, obtaining history from patient or surrogate, review of old charts and development of treatment plan with patient or surrogate   I assumed direction of critical care for this patient from another provider in my specialty: no     (including critical care time)  Medications Ordered in ED Medications  amLODipine (NORVASC) tablet 5 mg (has no administration in time range)  metoprolol tartrate (LOPRESSOR) tablet 25 mg (has no administration in time range)  rosuvastatin (CRESTOR) tablet 20 mg (has no administration in time range)  irbesartan (AVAPRO) tablet 75 mg (has no administration in time range)  ALPRAZolam (XANAX) tablet 1 mg (has no administration in time range)  pantoprazole (PROTONIX) EC tablet 40 mg (has no administration in time range)  gabapentin (NEURONTIN) capsule 400 mg (has no administration in time range)  multivitamin with minerals tablet 1 tablet (has no administration in time range)  NON FORMULARY (has no administration in time range)  albuterol (PROVENTIL) (2.5 MG/3ML) 0.083% nebulizer solution 2.5 mg (has no administration in time range)  mometasone-formoterol (DULERA) 200-5 MCG/ACT inhaler 2 puff (has no administration in time range)  predniSONE (DELTASONE) tablet 40 mg (has no administration in time range)   enoxaparin (LOVENOX) injection 40 mg (has no administration in time range)  polyethylene glycol (MIRALAX / GLYCOLAX) packet 17 g (has no administration in time range)  insulin aspart (novoLOG) injection 0-9 Units (has no administration in time range)  doxycycline (VIBRA-TABS) tablet 100 mg (has no administration in time range)  sodium chloride flush (NS) 0.9 % injection 3 mL (3 mLs Intravenous Given 01/25/19 1522)  methylPREDNISolone sodium succinate (SOLU-MEDROL) 125 mg/2 mL injection 125 mg (125 mg Intravenous Given 01/25/19 1650)  albuterol (VENTOLIN HFA) 108 (90 Base) MCG/ACT inhaler 8 puff (8 puffs Inhalation Given 01/25/19 1635)  ipratropium (ATROVENT HFA) inhaler 6 puff (6 puffs Inhalation Given 01/25/19 1759)  sodium chloride 0.9 % bolus 250 mL (0 mLs Intravenous Stopped 01/25/19 1805)     Initial Impression / Assessment and Plan / ED Course  I have reviewed the triage vital signs and the nursing notes.  Pertinent labs & imaging results that were available during my care of the patient were reviewed by me and considered in my medical decision making (see chart for details).  Clinical Course as of Jan 25 2000  Sat Jan 25, 2019  1629 WBC(!): 13.7 [CG]  1629 Lactic Acid, Venous(!!): 2.4 [CG]  1629 Fever rectal 101.4   SpO2(!): 88 % [CG]  1630 Sepsis criteria met with WBC, fever, hypoxemia, tachypnea, lactic acidosis. Likely from respiratory source. CXR w/o infiltrate. This could be COVID. Will defer abx now due to concern for viral process.    [CG]  1641 Normal sinus rhythm Nonspecific ST abnormality Abnormal ECG no prior to compare with Confirmed by Aletta Edouard [id number]) on 01/25/2019  2:59:59 PM  ED EKG [CG]    Clinical Course User Index [CG] Kinnie Feil, PA-C   DDX includes PNA versus COPD exacerbation versus viral URI/LRI. Presentation is not consistent with ACS, CHF exacerbation, PE.  Patient arrives with rectal fever 101.4, tachypnea, hypoxemia in the high 80s.  He  is on 2 LNC.  WBC 13.7, lactic acid 2.4.  He meets sirs criteria with source likely pulmonary.  CXR without infiltrate, PTX, edema, cardiomegaly.  Patient given albuterol/ipratropium inhaler puffs, methylprednisone in the ER.  Although sirs criteria is met, there is no pneumonia on chest x-ray and there is concern for viral process so will defer antibiotics at this time.  250 cc IV fluids for mild lactic acidosis given.  Will discuss with medicine team for admission of hypoxemia likely from COPD exacerbation versus viral pulmonary infection.  COVID test is pending.  Final Clinical Impressions(s) / ED Diagnoses   Final diagnoses:  Hypoxemia    ED Discharge Orders    None       Arlean Hopping 01/25/19 2001    Daleen Bo, MD 01/27/19 1059

## 2019-01-25 NOTE — Progress Notes (Signed)
RT provided patient with flutter valve. Patient did flutter with good effort and good cough. RT will monitor as needed.

## 2019-01-25 NOTE — ED Triage Notes (Signed)
Pt reports sob for the past few days, states that he has a cough that began about 2 weeks ago but also reports hx of pneumonia about 1 month ago. Had recent covid test a few weeks ago that was negative. Pts O2 sat in high 80s in triage, placed on 4L O2 and sats improved to 96%. Audible wheezing.

## 2019-01-26 DIAGNOSIS — J441 Chronic obstructive pulmonary disease with (acute) exacerbation: Principal | ICD-10-CM

## 2019-01-26 DIAGNOSIS — R0902 Hypoxemia: Secondary | ICD-10-CM

## 2019-01-26 LAB — CBC
HCT: 30.8 % — ABNORMAL LOW (ref 39.0–52.0)
Hemoglobin: 9.9 g/dL — ABNORMAL LOW (ref 13.0–17.0)
MCH: 27.5 pg (ref 26.0–34.0)
MCHC: 32.1 g/dL (ref 30.0–36.0)
MCV: 85.6 fL (ref 80.0–100.0)
Platelets: 225 10*3/uL (ref 150–400)
RBC: 3.6 MIL/uL — ABNORMAL LOW (ref 4.22–5.81)
RDW: 15.2 % (ref 11.5–15.5)
WBC: 9.6 10*3/uL (ref 4.0–10.5)
nRBC: 0 % (ref 0.0–0.2)

## 2019-01-26 LAB — BASIC METABOLIC PANEL WITH GFR
Anion gap: 13 (ref 5–15)
BUN: 14 mg/dL (ref 8–23)
CO2: 24 mmol/L (ref 22–32)
Calcium: 9 mg/dL (ref 8.9–10.3)
Chloride: 97 mmol/L — ABNORMAL LOW (ref 98–111)
Creatinine, Ser: 1.1 mg/dL (ref 0.61–1.24)
GFR calc Af Amer: >60 mL/min
GFR calc non Af Amer: >60 mL/min
Glucose, Bld: 314 mg/dL — ABNORMAL HIGH (ref 70–99)
Potassium: 3.9 mmol/L (ref 3.5–5.1)
Sodium: 134 mmol/L — ABNORMAL LOW (ref 135–145)

## 2019-01-26 LAB — GLUCOSE, CAPILLARY
Glucose-Capillary: 298 mg/dL — ABNORMAL HIGH (ref 70–99)
Glucose-Capillary: 285 mg/dL — ABNORMAL HIGH (ref 70–99)
Glucose-Capillary: 413 mg/dL — ABNORMAL HIGH (ref 70–99)
Glucose-Capillary: 317 mg/dL — ABNORMAL HIGH (ref 70–99)

## 2019-01-26 LAB — LACTIC ACID, PLASMA: Lactic Acid, Venous: 3 mmol/L (ref 0.5–1.9)

## 2019-01-26 LAB — URINE CULTURE: Culture: NO GROWTH

## 2019-01-26 MED ORDER — INSULIN ASPART 100 UNIT/ML ~~LOC~~ SOLN
12.0000 [IU] | Freq: Once | SUBCUTANEOUS | Status: AC
  Administered 2019-01-26: 12 [IU] via SUBCUTANEOUS

## 2019-01-26 MED ORDER — AMOXICILLIN-POT CLAVULANATE 500-125 MG PO TABS
1.0000 | ORAL_TABLET | Freq: Three times a day (TID) | ORAL | Status: DC
  Administered 2019-01-26 – 2019-01-27 (×3): 500 mg via ORAL
  Filled 2019-01-26 (×3): qty 1

## 2019-01-26 MED ORDER — SODIUM CHLORIDE 3 % IN NEBU
4.0000 mL | INHALATION_SOLUTION | Freq: Two times a day (BID) | RESPIRATORY_TRACT | Status: DC
  Administered 2019-01-26 (×2): 4 mL via RESPIRATORY_TRACT
  Administered 2019-01-27: 15 mL via RESPIRATORY_TRACT
  Administered 2019-01-27 – 2019-01-28 (×2): 4 mL via RESPIRATORY_TRACT
  Filled 2019-01-26 (×6): qty 4

## 2019-01-26 MED ORDER — GUAIFENESIN 100 MG/5ML PO SOLN
5.0000 mL | ORAL | Status: DC | PRN
  Administered 2019-01-26 – 2019-01-28 (×4): 100 mg via ORAL
  Filled 2019-01-26 (×4): qty 5

## 2019-01-26 MED ORDER — INSULIN GLARGINE 100 UNIT/ML ~~LOC~~ SOLN
5.0000 [IU] | Freq: Every day | SUBCUTANEOUS | Status: DC
  Administered 2019-01-26 – 2019-01-28 (×3): 5 [IU] via SUBCUTANEOUS
  Filled 2019-01-26 (×3): qty 0.05

## 2019-01-26 NOTE — Progress Notes (Signed)
Family Medicine Teaching Service Daily Progress Note Intern Pager: [phone]  Patient name: Travis Beck Medical record number: [id number] Date of birth: [birth year] Age: 73 y.o. Gender: male  Primary Care Provider: Monico Blitz, MD Consultants: n/a Code Status: full  Pt Overview and Major Events to Date:  Virgal [place] is a 73 y.o. male presenting with dyspnea and productive cough . PMH is significant for COPD, pulmonary nodule, DM chronic pain (back, knees and shoulders)  Assessment and Plan: Shortness of breath w/ elevated lactic acid:  Lactic acid resolving from peak of 3.6 down to 3.0 morning of 7/19.  Shortness of breath resolving, on room air overnight 7/18 with sats charted @95 , patient comfortably walking around the room on room air asking for refill of his couple coffee this morning.  Still COPD most likely vs less likely pneumonia.  There is significant concern that there might be dementia impacting his reliable taking of medications.  He may not have actually failed prior courses of antibiotics.   -Monitor saturations-continous pulse oximetry  -Antibiotics-started on zosyn overnight 7/18 per sepsis protocol, expect to d/c after rounds with attending -Prednisone 40mg  5 day course (7/18- ) -s/p 732ml bolus in ED, do not anticipate need for more IVF. -Respiratory therapy to follow -Patient education regarding how take inhalers -scheduled duonebs -Albuterol Q2PRN, dulera -Moca per SLP -home dulera (formulary substitution)  Diabetes-cbgs uncontrolled but just started steroids  Home meds: Glyburide 10mg  BID, metformin 1g BID.  Last CBG pm 7/18 >300 but had just started steroids. -Sliding scale insulin -start lantus 5u 7/19 -hold home meds  Gastritis, ulcers Seen by GI as outpatient On pantoprazole 40 mg BID  -Continue Pantoprazole   Dysphagia-chronic, patient says he has "trouble swallowing.  But minutes eat most of his breakfast.  Does have some productive  sputum with cough, if there was significant aspiration it was not indicated on the chest x-ray -Outpatient follow-up if not causing new problems this admission.  HTN- hypotensive overnight 7/18 map 63, am 7/10 w/ map of 81-89.  Might have just been due to sleeping Home meds: Amlodipine 5mg  once a day, Valsartan 80mg  once a day, hydralazine 25mg  BID, metoprolol 25mg  BID. Had planned to continue metoprolol/amldodipine -hold all anti-hypertensives until BP resolves, metoprolol will be first restart  Drop in hemoglobin: From 12.4 on admission to 9.9 morning of 719.  With drop also in platelets and then white blood count, this is likely dilutional but will follow in the morning.  Patient not complaining of any lightheadedness or dizziness.  HLD  -continue home med: Rosuvastatin 40mg   Nutrition  -continue Vitamin B-12, omega-3 fatty acids  Chronic pain Home meds: Ibuprofen 800mg  PRN, gabapentin 400mg  TID, buprenorphine 4mg  BID -continue   Anxiety  Home meds: Alprazolam 1mg  TID  FEN/GI: Normal diet, Protinix Prophylaxis: Lovenox   Disposition: home after resolution of COPD exacerbation and when not requiring oxygen   Subjective:  Elevated lactic acid seems inconsistent with patient's clinical presentation as he is comfortably walking around the room eating his breakfast.  I do have concern that his outpatient regimen was not actually being followed given his mental status and his inability to process discussion about medication regimen.  I will note this in a discharge summary for his PCP to be aware of.  Objective: Temp:  [97.5 F (36.4 C)-101.4 F (38.6 C)] 97.5 F (36.4 C) (07/18 2353) Pulse Rate:  [73-133] 73 (07/18 2353) Resp:  [12-22] 16 (07/18 2353) BP: (91-162)/(51-103) 91/51 (07/18 2353) SpO2:  [  88 %-98 %] 95 % (07/19 0546) Physical Exam: General: Very comfortable appearing, walking around the room on room air Cardiovascular: Regular rate and rhythm, no significant  murmur noted on exam Respiratory: Still with significant expiratory wheezes, rhonchi.  No increased work of breathing, good air movement throughout Abdomen: Prominent belly, no tenderness to palpation Extremities: No deficits this patient was walking around the room well.  Laboratory: Recent Labs  Lab 01/25/19 1428  WBC 13.7*  HGB 12.4*  HCT 37.4*  PLT 283   Recent Labs  Lab 01/25/19 1428  NA 130*  K 4.0  CL 88*  CO2 27  BUN 14  CREATININE 0.95  CALCIUM 10.0  GLUCOSE 171*    Imaging/Diagnostic Tests: Dg Chest Portable 1 View  Result Date: 01/25/2019 CLINICAL DATA:  Shortness of breath. EXAM: PORTABLE CHEST 1 VIEW COMPARISON:  Radiographs of January 03, 2019. FINDINGS: The heart size and mediastinal contours are within normal limits. Both lungs are clear. No pneumothorax or pleural effusion is noted. The visualized skeletal structures are unremarkable. IMPRESSION: No active disease. Electronically Signed   By: Marijo Conception M.D.   On: 01/25/2019 16:07     Sherene Sires, DO 01/26/2019, 6:50 AM PGY-3, [place] Intern pager: [phone], text pages welcome

## 2019-01-26 NOTE — Progress Notes (Addendum)
CRITICAL VALUE ALERT  Critical Value:  Lactic Acid 3.0  Date & Time Notied:  01/26/19 0855  Provider Notified: Family Medicine Teaching Service  Orders Received/Actions taken: No new orders/action

## 2019-01-26 NOTE — Progress Notes (Signed)
Aware of critical lactic acid 3.0.  Improving from yesterday and patient looks significantly improved clinically, walking around room on room air.  No urgent change in plan at this time.  -Dr. Criss Rosales

## 2019-01-26 NOTE — Progress Notes (Signed)
CRITICAL VALUE ALERT  Critical Value: Blood glucose 413  Date & Time Notied:  10/27/2018 1613  Provider Notified: Family Medicine Teaching Service  Orders Received/Actions taken: One time order for 12 units of Insulin

## 2019-01-27 LAB — CBC
HCT: 27.9 % — ABNORMAL LOW (ref 39.0–52.0)
Hemoglobin: 9.1 g/dL — ABNORMAL LOW (ref 13.0–17.0)
MCH: 27.6 pg (ref 26.0–34.0)
MCHC: 32.6 g/dL (ref 30.0–36.0)
MCV: 84.5 fL (ref 80.0–100.0)
Platelets: 212 10*3/uL (ref 150–400)
RBC: 3.3 MIL/uL — ABNORMAL LOW (ref 4.22–5.81)
RDW: 15.5 % (ref 11.5–15.5)
WBC: 8.2 10*3/uL (ref 4.0–10.5)
nRBC: 0 % (ref 0.0–0.2)

## 2019-01-27 LAB — BASIC METABOLIC PANEL
Anion gap: 10 (ref 5–15)
BUN: 10 mg/dL (ref 8–23)
CO2: 26 mmol/L (ref 22–32)
Calcium: 8.4 mg/dL — ABNORMAL LOW (ref 8.9–10.3)
Chloride: 99 mmol/L (ref 98–111)
Creatinine, Ser: 0.88 mg/dL (ref 0.61–1.24)
GFR calc Af Amer: >60 mL/min (ref >60)
GFR calc non Af Amer: >60 mL/min (ref >60)
Glucose, Bld: 231 mg/dL — ABNORMAL HIGH (ref 70–99)
Potassium: 3.5 mmol/L (ref 3.5–5.1)
Sodium: 135 mmol/L (ref 135–145)

## 2019-01-27 LAB — GLUCOSE, CAPILLARY
Glucose-Capillary: 195 mg/dL — ABNORMAL HIGH (ref 70–99)
Glucose-Capillary: 377 mg/dL — ABNORMAL HIGH (ref 70–99)
Glucose-Capillary: 344 mg/dL — ABNORMAL HIGH (ref 70–99)
Glucose-Capillary: 323 mg/dL — ABNORMAL HIGH (ref 70–99)

## 2019-01-27 MED ORDER — AZITHROMYCIN 250 MG PO TABS
500.0000 mg | ORAL_TABLET | ORAL | Status: AC
  Administered 2019-01-27 – 2019-01-28 (×2): 500 mg via ORAL
  Filled 2019-01-27 (×2): qty 2

## 2019-01-27 NOTE — Progress Notes (Signed)
PT Cancellation Note  Patient Details Name: Travis Beck MRN: [id number] DOB: [birth year]   Cancelled Treatment:    Reason Eval/Treat Not Completed: Other (comment).   Pt reports he is fine, is not reporting any falls and states he can walk fine on the hall.  Will follow up tomorrow if he is still here to see if he feels any different.  Nsg notified of his decision.  Will not dc order for now, per a family member's concern about balance.   Ramond Dial 01/27/2019, 10:56 AM   Mee Hives, PT MS Acute Rehab Dept. Number: [place] [facility] [place]

## 2019-01-27 NOTE — Progress Notes (Addendum)
Family Medicine Teaching Service Daily Progress Note Intern Pager: [phone]  Patient name: Travis Beck Medical record number: [id number] Date of birth: Apr 01, 1946 Age: 73 y.o. Gender: male  Primary Care Provider: Monico Blitz, MD Consultants: n/a Code Status: full  Pt Overview and Major Events to Date:  Jacier Gladu is a 73 y.o. male presenting with dyspnea and productive cough . PMH is significant for COPD, pulmonary nodule, DM chronic pain (back, knees and shoulders)  Assessment and Plan: Shortness of breath w/ elevated lactic acid:  Lactic acid resolving from peak of 3.6 down to 3.0 morning of 7/19.  Shortness of breath resolving, on room air overnight 7/20 with sats charted @94 -96%.  Still COPD most likely vs less likely pneumonia.  Patient has history of dementia and may not have actually failed prior courses of antibiotics.  -Monitor saturations-continous pulse oximetry  -Antibiotics-Currently on augmentin -Prednisone 40mg  5 day course (7/18- ) -scheduled duonebs -Albuterol Q2PRN, dulera -Moca per SLP -home dulera (formulary substitution)  Diabetes-cbgs uncontrolled but just started steroids  Home meds: Glyburide 10mg  BID, metformin 1g BID.  Last CBG pm 7/19 >300 but had just started steroids. Now, 7/20 - 195. -Sliding scale insulin -start lantus 5u 7/19  -hold home meds  Gastritis, ulcers Seen by GI as outpatient On pantoprazole 40 mg BID  -Continue Pantoprazole   Dysphagia-chronic, patient says he has "trouble swallowing.  But minutes eat most of his breakfast.  Does have some productive sputum with cough, if there was significant aspiration it was not indicated on the chest x-ray -Outpatient follow-up if not causing new problems this admission.  HTN- hypotensive overnight 7/18 map 63, am 7/10 w/ map of 81-89.  Might have just been due to sleeping Home meds: Amlodipine 5mg  once a day, Valsartan 80mg  once a day, hydralazine 25mg  BID, metoprolol 25mg   BID. Had planned to continue metoprolol/amldodipine -hold all anti-hypertensives until BP resolves, metoprolol will be first restart - BP hanging in 112-128/63-79 on 7/19-7/20.   HLD  -continue home med: Rosuvastatin 40mg   Nutrition  -continue Vitamin B-12, omega-3 fatty acids  Chronic pain Home meds: Ibuprofen 800mg  PRN, gabapentin 400mg  TID, buprenorphine 4mg  BID -continue   Anxiety  Home meds: Alprazolam 1mg  TID  FEN/GI: Normal diet, Protinix Prophylaxis: Lovenox   Disposition: Pending medical workup and PT/OT eval  Subjective:  Patient without complaints on room air. Does still have a cough but states it is no worse. Denies subjective fever, chills, or worsening breathing.   Objective: Temp:  [97.6 F (36.4 C)] 97.6 F (36.4 C) (07/19 2300) Pulse Rate:  [86-102] 86 (07/19 2300) Resp:  [16-17] 16 (07/19 1514) BP: (112-128)/(63-72) 112/68 (07/19 2300) SpO2:  [94 %-96 %] 96 % (07/19 2300) Weight:  [70.3 kg] 70.3 kg (07/19 1108) Physical Exam: General: Alert and oriented in no apparent distress Heart: Regular rate and rhythm with no murmurs appreciated Lungs: Breath sounds course with some wheezing throughout Abdomen: Bowel sounds present, no abdominal pain Skin: Warm and dry Extremities: No lower extremity edema  Laboratory: Recent Labs  Lab 01/25/19 1428 01/26/19 0741  WBC 13.7* 9.6  HGB 12.4* 9.9*  HCT 37.4* 30.8*  PLT 283 225   Recent Labs  Lab 01/25/19 1428 01/26/19 0741  NA 130* 134*  K 4.0 3.9  CL 88* 97*  CO2 27 24  BUN 14 14  CREATININE 0.95 1.10  CALCIUM 10.0 9.0  GLUCOSE 171* 314*    Imaging/Diagnostic Tests: Dg Chest Portable 1 View  Result Date: 01/25/2019 CLINICAL  DATA:  Shortness of breath. EXAM: PORTABLE CHEST 1 VIEW COMPARISON:  Radiographs of January 03, 2019. FINDINGS: The heart size and mediastinal contours are within normal limits. Both lungs are clear. No pneumothorax or pleural effusion is noted. The visualized skeletal  structures are unremarkable. IMPRESSION: No active disease. Electronically Signed   By: Marijo Conception M.D.   On: 01/25/2019 16:07     Lurline Del, MD 01/27/2019, 5:36 AM PGY-3, [place] Intern pager: [phone], text pages welcome

## 2019-01-27 NOTE — Progress Notes (Addendum)
Occupational Therapy Evaluation Patient Details Name: Travis Beck MRN: [id number] DOB: 1946-05-11 Today's Date: 01/27/2019    History of Present Illness Dontarius Beck is a 73 y.o. male presenting with dyspnea and productive cough . PMH is significant for COPD, pulmonary nodule, DM chronic pain (back, knees and shoulders)   Clinical Impression   PTA, pt was living at home with his brother, who is blind and pt assists brother, pt reports he was independent with ADL/IADL and functional mobility. Per chart, pt has dementia. Pt reports he was driving and grocery shopping. Pt currently appears to be functioning at or close to baseline, completing ADL at modified independent level and modified independent for functional mobility with no AD and no LOB. Educated pt on energy conservation strategies with provided handout. Spoke with pt's sister Travis Beck via phone, who checks in with pt daily and assists pt with medication management. Feel pt would benefit from continued OT services to address energy conservation strategies and medication management. Will continue to follow acutely and progress as tolerated.      Follow Up Recommendations  Supervision - Intermittent;Home health OT    Equipment Recommendations  None recommended by OT    Recommendations for Other Services Speech consult     Precautions / Restrictions Precautions Precautions: Fall Restrictions Weight Bearing Restrictions: No      Mobility Bed Mobility Overal bed mobility: Independent                Transfers Overall transfer level: Independent Equipment used: None             General transfer comment: pt able to maneuver tray table throughout room no LOB noted during functional mobiltiy    Balance Overall balance assessment: No apparent balance deficits (not formally assessed)                                         ADL either performed or assessed with clinical  judgement   ADL Overall ADL's : Modified independent                                       General ADL Comments: pt appears to be at baseline and is modified independent with ADL;pt reports he gets tired during ADL completion;began education about energy conservation strategies with provided handout;pt with no additional questions      Vision Baseline Vision/History: Wears glasses Wears Glasses: Reading only Patient Visual Report: No change from baseline       Perception     Praxis      Pertinent Vitals/Pain Pain Assessment: No/denies pain     Hand Dominance Right   Extremity/Trunk Assessment Upper Extremity Assessment Upper Extremity Assessment: Overall WFL for tasks assessed   Lower Extremity Assessment Lower Extremity Assessment: Overall WFL for tasks assessed   Cervical / Trunk Assessment Cervical / Trunk Assessment: Normal   Communication Communication Communication: No difficulties   Cognition Arousal/Alertness: Awake/alert Behavior During Therapy: WFL for tasks assessed/performed Overall Cognitive Status: History of cognitive impairments - at baseline                                 General Comments: Per chart, pt has history of dementia;pt reports sister assists with medication  management, confirmed this with his sister Travis Beck;Pt scored a 2/10 on medi-cog assessment (8/10 indicates adequate cognition skills);discussed results with pt and sister (via phone call) and advised sister to continue monitoring pt's medication;   General Comments  VSS throughout;pt on RA spO2 99% during functional mobility    Exercises     Shoulder Instructions      Home Living Family/patient expects to be discharged to:: Private residence Living Arrangements: Other relatives Available Help at Discharge: Family;Friend(s) Type of Home: House Home Access: Level entry     Home Layout: One level     Bathroom Shower/Tub: Animal nutritionist: Standard     Home Equipment: Environmental consultant - 2 wheels;Cane - single point;Shower seat;Grab bars - tub/shower;Grab bars - toilet   Additional Comments: Pt lives with his brother who is blid      Prior Functioning/Environment Level of Independence: Independent        Comments: Pt was driving, grocery shopping, and assisting his brother        OT Problem List: Cardiopulmonary status limiting activity      OT Treatment/Interventions: Energy conservation;Cognitive remediation/compensation;Patient/family education;Balance training    OT Goals(Current goals can be found in the care plan section) Acute Rehab OT Goals Patient Stated Goal: to get breathing under control OT Goal Formulation: With patient Time For Goal Achievement: 02/03/19 Potential to Achieve Goals: Good  OT Frequency: Min 2X/week   Barriers to D/C:            Co-evaluation              AM-PAC OT "6 Clicks" Daily Activity     Outcome Measure Help from another person eating meals?: None Help from another person taking care of personal grooming?: None Help from another person toileting, which includes using toliet, bedpan, or urinal?: None Help from another person bathing (including washing, rinsing, drying)?: None Help from another person to put on and taking off regular upper body clothing?: None Help from another person to put on and taking off regular lower body clothing?: None 6 Click Score: 24   End of Session Nurse Communication: Mobility status  Activity Tolerance: Patient tolerated treatment well Patient left: in chair;with call bell/phone within reach  OT Visit Diagnosis: Other abnormalities of gait and mobility (R26.89)                Time: 5102-5852 OT Time Calculation (min): 29 min Charges:  OT General Charges $OT Visit: 1 Visit OT Evaluation $OT Eval Low Complexity: 1 Low OT Treatments $Cognitive Funtion inital: Initial 15 mins  Dorinda Hill OTR/L Acute  Rehabilitation Services Office: [phone]   Wyn Forster 01/27/2019, 10:13 AM

## 2019-01-27 NOTE — Discharge Summary (Signed)
[facility] Discharge Summary  Patient name: Travis Beck Medical record number: [id number] Date of birth: 1946-06-14 Age: 73 y.o. Gender: male Date of Admission: 01/25/2019  Date of Discharge: 01/28/2019 Admitting Physician: Lucianne Lei, MD  Primary Care Provider: Monico Blitz, MD Consultants: Pulm  Indication for Hospitalization: Dyspnea   Discharge Diagnoses/Problem List:  1.1cm upper lobe pulmonary nodule-under investigation  COPD Diabetes Gastritis  HTN HLD Chronic pain Anxiety   Disposition: Home  Discharge Condition:  Stable, resolution of exacerbation of COPD  Discharge Exam: General: Alert and oriented in no apparent distress Heart: Regular rate and rhythm with no murmurs appreciated Lungs: Course breath sounds with expiratory wheezing throughout. Abdomen: Bowel sounds present, no abdominal pain Skin: Warm and dry Extremities: No lower extremity edema   Brief Hospital Course:  Travis Beck was admitted following a recent history of dyspnea and productive cough which was worsening. He noticed a change in his sputum colour to yellow from white. He had been given Azithromycin, Doxycycline and Prednisone by his Pulmonologist and PCP without improvement in his symptoms. Presented to the ER with a fever of 101.4, dyspnea, wheezing and a new oxygen requirement-sats 95% on 3.5L. Significant labs showed: WBC 13.7 and LA 3.2. CXR showed no pulmonary edema or evidence of pneumonia.Marland KitchenHe was started on IV methylprednisolone, ipratropium and albuterol inhalers. We initially treated him with Augmentin and then switched to oral Azithromycin on 7/20. Pt responded well to this treatment and very quickly did not require oxygen and was mobilizing well on room air and was discharged as such on 7/21.  Issues for Follow Up:  1. Pt has been taking his inhalers wrong at home likely due to poor cognitive ability: taking the albuterol as the long acting inhaler  and symbicort as a PRN inhlaer. Please ensure pt is taking the inhalers correctly as this would prevent another COPD exacerbation.  2. Please monitor pt's CBGs on discharge. Has been>300 in hospital but had started steroids. 3. Pt has ongoing dysphagia, please monitor his symptoms on discharge-patient may need modified barium swallow 4. Pt has lung nodule and given his smoking hx and chronic bronchitis, please ensure he follows up with his Pulmonologist for this nodule 5. Please ensure pt follows up with Pulmonologist-Dr Mannam or Tammy Parret NP in 2-3 weeks 6.  Consider reducing outpatient sedating medications (Xanax, etc).  Significant Procedures: None  Significant Labs and Imaging:  Recent Labs  Lab 01/26/19 0741 01/27/19 0700 01/28/19 0728  WBC 9.6 8.2 6.1  HGB 9.9* 9.1* 9.3*  HCT 30.8* 27.9* 29.0*  PLT 225 212 219   Recent Labs  Lab 01/25/19 1428 01/26/19 0741 01/27/19 0700 01/28/19 0728  NA 130* 134* 135 137  K 4.0 3.9 3.5 3.4*  CL 88* 97* 99 102  CO2 27 24 26 25   GLUCOSE 171* 314* 231* 230*  BUN 14 14 10 9   CREATININE 0.95 1.10 0.88 0.93  CALCIUM 10.0 9.0 8.4* 8.5*      Results/Tests Pending at Time of Discharge: None  Discharge Medications:  Allergies as of 01/28/2019      Reactions   Penicillins Rash   No problems with ampicillin during hospitalization      Medication List    STOP taking these medications   Fish Oil 1000 MG Caps   hydrALAZINE 25 MG tablet Commonly known as: APRESOLINE   ibuprofen 800 MG tablet Commonly known as: ADVIL   metoprolol tartrate 25 MG tablet Commonly known as: LOPRESSOR   multivitamin  Chew chewable tablet   multivitamin with minerals Tabs tablet     TAKE these medications   albuterol 108 (90 Base) MCG/ACT inhaler Commonly known as: VENTOLIN HFA Inhale 2 puffs into the lungs every 6 (six) hours as needed for wheezing or shortness of breath.   ALPRAZolam 1 MG tablet Commonly known as: XANAX Take 1 tablet (1 mg  total) by mouth 3 (three) times daily as needed for anxiety. What changed:   when to take this  reasons to take this   amLODipine 5 MG tablet Commonly known as: NORVASC Take 1 tablet (5 mg total) by mouth daily.   benzonatate 200 MG capsule Commonly known as: TESSALON Take 1 capsule (200 mg total) by mouth 3 (three) times daily as needed for cough.   budesonide-formoterol 80-4.5 MCG/ACT inhaler Commonly known as: Symbicort Inhale 2 puffs into the lungs 2 (two) times daily.   buprenorphine 8 MG Subl SL tablet Commonly known as: SUBUTEX Place 4 mg under the tongue 2 (two) times daily as needed for pain.   fluticasone 50 MCG/ACT nasal spray Commonly known as: FLONASE Place 2 sprays into both nostrils daily.   gabapentin 400 MG capsule Commonly known as: NEURONTIN Take 1 capsule (400 mg total) by mouth every 8 (eight) hours as needed. What changed:   when to take this  reasons to take this   glyBURIDE 5 MG tablet Commonly known as: DIABETA Take 10 mg by mouth 2 (two) times daily with a meal.   guaiFENesin 100 MG/5ML Soln Commonly known as: ROBITUSSIN Take 5 mLs (100 mg total) by mouth every 4 (four) hours as needed for cough or to loosen phlegm.   metFORMIN 1000 MG tablet Commonly known as: GLUCOPHAGE Take 1,000 mg by mouth 2 (two) times daily with a meal.   pantoprazole 40 MG tablet Commonly known as: PROTONIX Take 1 tablet (40 mg total) by mouth 2 (two) times daily before a meal. What changed: when to take this   polyethylene glycol 17 g packet Commonly known as: MIRALAX / GLYCOLAX Take 17 g by mouth daily as needed for mild constipation.   predniSONE 20 MG tablet Commonly known as: DELTASONE Take 2 tablets (40 mg total) by mouth daily with breakfast for 1 day. Start taking on: January 29, 2019   rosuvastatin 20 MG tablet Commonly known as: CRESTOR Take 1 tablet (20 mg total) by mouth daily.   valsartan 80 MG tablet Commonly known as: DIOVAN Take 80 mg  by mouth daily.   vitamin B-12 500 MCG tablet Commonly known as: CYANOCOBALAMIN Take 500 mcg by mouth daily.       Discharge Instructions: Please refer to Patient Instructions section of EMR for full details.  Patient was counseled important signs and symptoms that should prompt return to medical care, changes in medications, dietary instructions, activity restrictions, and follow up appointments.   Follow-Up Appointments:   Lurline Del, MD 01/28/2019, 1:34 PM PGY-1, [place]

## 2019-01-27 NOTE — Progress Notes (Signed)
Inpatient Diabetes Program Recommendations  AACE/ADA: New Consensus Statement on Inpatient Glycemic Control   Target Ranges:  Prepandial:   less than 140 mg/dL      Peak postprandial:   less than 180 mg/dL (1-2 hours)      Critically ill patients:  140 - 180 mg/dL   Results for Travis Beck, Travis Beck (MRN [id number]) as of 01/27/2019 10:20  Ref. Range 01/26/2019 07:51 01/26/2019 11:50 01/26/2019 16:11 01/26/2019 21:19 01/27/2019 07:36  Glucose-Capillary Latest Ref Range: 70 - 99 mg/dL 298 (H) 285 (H) 413 (H) 317 (H) 195 (H)   Review of Glycemic Control  Diabetes history: DM2 Outpatient Diabetes medications: Glyburide XL 10 mg BID, Metformin 1000 mg BID Current orders for Inpatient glycemic control: Lantus 5 units daily, Novolog 0-9 units TID with meals, Novolog 0-5 units QHS; Prednisone 40 mg QAM  Inpatient Diabetes Program Recommendations:  Insulin - Meal Coverage: If steroids are continued, please consider ordering Novolog 4 units TID with meals for meal coverage if patient eats at least 50% of meals.  Thanks, Barnie Alderman, RN, MSN, CDE Diabetes Coordinator Inpatient Diabetes Program [phone] (Team Pager from 8am to 5pm)

## 2019-01-28 ENCOUNTER — Ambulatory Visit (INDEPENDENT_AMBULATORY_CARE_PROVIDER_SITE_OTHER): Payer: Medicare Other | Admitting: Internal Medicine

## 2019-01-28 LAB — BASIC METABOLIC PANEL
Anion gap: 10 (ref 5–15)
BUN: 9 mg/dL (ref 8–23)
CO2: 25 mmol/L (ref 22–32)
Calcium: 8.5 mg/dL — ABNORMAL LOW (ref 8.9–10.3)
Chloride: 102 mmol/L (ref 98–111)
Creatinine, Ser: 0.93 mg/dL (ref 0.61–1.24)
GFR calc Af Amer: >60 mL/min (ref >60)
GFR calc non Af Amer: >60 mL/min (ref >60)
Glucose, Bld: 230 mg/dL — ABNORMAL HIGH (ref 70–99)
Potassium: 3.4 mmol/L — ABNORMAL LOW (ref 3.5–5.1)
Sodium: 137 mmol/L (ref 135–145)

## 2019-01-28 LAB — CBC
HCT: 29 % — ABNORMAL LOW (ref 39.0–52.0)
Hemoglobin: 9.3 g/dL — ABNORMAL LOW (ref 13.0–17.0)
MCH: 27.4 pg (ref 26.0–34.0)
MCHC: 32.1 g/dL (ref 30.0–36.0)
MCV: 85.3 fL (ref 80.0–100.0)
Platelets: 219 10*3/uL (ref 150–400)
RBC: 3.4 MIL/uL — ABNORMAL LOW (ref 4.22–5.81)
RDW: 15.9 % — ABNORMAL HIGH (ref 11.5–15.5)
WBC: 6.1 10*3/uL (ref 4.0–10.5)
nRBC: 0 % (ref 0.0–0.2)

## 2019-01-28 LAB — GLUCOSE, CAPILLARY
Glucose-Capillary: 224 mg/dL — ABNORMAL HIGH (ref 70–99)
Glucose-Capillary: 449 mg/dL — ABNORMAL HIGH (ref 70–99)

## 2019-01-28 MED ORDER — GUAIFENESIN 100 MG/5ML PO SOLN: 5.0000 mL | ORAL | 0 refills | Status: DC | PRN

## 2019-01-28 MED ORDER — BENZONATATE 100 MG PO CAPS: 100.0000 mg | ORAL_CAPSULE | Freq: Three times a day (TID) | ORAL | 0 refills | Status: DC | PRN

## 2019-01-28 MED ORDER — PREDNISONE 20 MG PO TABS: 40.0000 mg | ORAL_TABLET | Freq: Every day | ORAL | 0 refills | Status: AC

## 2019-01-28 MED ORDER — ALPRAZOLAM 1 MG PO TABS: 1.0000 mg | ORAL_TABLET | Freq: Three times a day (TID) | ORAL | 0 refills | Status: AC | PRN

## 2019-01-28 MED ORDER — GABAPENTIN 400 MG PO CAPS: 400.0000 mg | ORAL_CAPSULE | Freq: Three times a day (TID) | ORAL | 1 refills | Status: AC | PRN

## 2019-01-28 MED ORDER — POLYETHYLENE GLYCOL 3350 17 G PO PACK: 17.0000 g | PACK | Freq: Every day | ORAL | 0 refills | Status: DC | PRN

## 2019-01-28 NOTE — Progress Notes (Signed)
Inpatient Diabetes Program Recommendations  AACE/ADA: New Consensus Statement on Inpatient Glycemic Control   Target Ranges:  Prepandial:   less than 140 mg/dL      Peak postprandial:   less than 180 mg/dL (1-2 hours)      Critically ill patients:  140 - 180 mg/dL   Results for DESEAN, HEEMSTRA (MRN [id number]) as of 01/28/2019 11:35  Ref. Range 01/27/2019 07:36 01/27/2019 11:45 01/27/2019 15:55 01/27/2019 20:41 01/28/2019 07:26  Glucose-Capillary Latest Ref Range: 70 - 99 mg/dL 195 (H) 377 (H) 344 (H) 323 (H) 224 (H)   Review of Glycemic Control  Diabetes history: DM2 Outpatient Diabetes medications: Glyburide XL 10 mg BID, Metformin 1000 mg BID Current orders for Inpatient glycemic control: Lantus 5 units daily, Novolog 0-9 units TID with meals, Novolog 0-5 units QHS; Prednisone 40 mg QAM  Inpatient Diabetes Program Recommendations:   Insulin-Basal: If steroids are continued, please consider increasing Lantus to 8 units daily.  Insulin - Meal Coverage: If steroids are continued, please consider ordering Novolog 4 units TID with meals for meal coverage if patient eats at least 50% of meals.  Thanks, Barnie Alderman, RN, MSN, CDE Diabetes Coordinator Inpatient Diabetes Program [phone] (Team Pager from 8am to 5pm)

## 2019-01-28 NOTE — Care Management Important Message (Signed)
Important Message  Patient Details  Name: Travis Beck MRN: [id number] Date of Birth: 25-Apr-1946   Medicare Important Message Given:  Yes     [name] 01/28/2019, 3:21 PM

## 2019-01-28 NOTE — Progress Notes (Signed)
Family Medicine Teaching Service Daily Progress Note Intern Pager: [phone]  Patient name: Travis Beck Medical record number: [id number] Date of birth: [birth year] Age: 73 y.o. Gender: male  Primary Care Provider: Monico Blitz, MD Consultants: n/a Code Status: full  Pt Overview and Major Events to Date:  Travis Beck is a 73 y.o. male presenting with dyspnea and productive cough . PMH is significant for COPD, pulmonary nodule, DM chronic pain (back, knees and shoulders)  Assessment and Plan: Shortness of breath w/ elevated lactic acid:  Lactic acid resolving from peak of 3.6 down to 3.0 morning of 7/19.  Shortness of breath resolving, on room air overnight 7/20 with sats charted @94 -96%.  Still COPD most likely vs less likely pneumonia.  Patient has history of dementia and may not have actually failed prior courses of antibiotics. -Monitor saturations-continous pulse oximetry  -Antibiotics- switched to PO azithro. -Prednisone 40mg  5 day course (7/18- ) -scheduled duonebs -Albuterol Q2PRN, dulera -Moca per SLP -home dulera (formulary substitution)  Diabetes-cbgs uncontrolled but just started steroids  Home meds: Glyburide 10mg  BID, metformin 1g BID.  Last CBG pm 7/19 >300 but had just started steroids. Now, 7/21 - 224-377. -Sliding scale insulin -start lantus 5u 7/19  -hold home meds  Gastritis, ulcers Seen by GI as outpatient On pantoprazole 40 mg BID  -Continue Pantoprazole   Dysphagia-chronic, patient says he has "trouble swallowing.  But minutes eat most of his breakfast.  Does have some productive sputum with cough, if there was significant aspiration it was not indicated on the chest x-ray -Outpatient follow-up if not causing new problems this admission.  HTN- hypotensive overnight 7/18 map 63, am 7/10 w/ map of 81-89.  Might have just been due to sleeping - resolved Home meds: Amlodipine 5mg  once a day, Valsartan 80mg  once a day, hydralazine 25mg  BID,  metoprolol 25mg  BID. Had planned to continue metoprolol/amldodipine -hold all anti-hypertensives until BP resolves, metoprolol will be first restart - BP hanging in 115-151/78-80 on 7/20-7/21.   HLD  -continue home med: Rosuvastatin 40mg   Nutrition  -continue Vitamin B-12, omega-3 fatty acids  Chronic pain Home meds: Ibuprofen 800mg  PRN, gabapentin 400mg  TID, buprenorphine 4mg  BID -continue   Anxiety  Home meds: Alprazolam 1mg  TID  Patient cognition/ question of dementia: - Speech eval. pending today to determine patient cogitative ability and any potential for resources at home.   FEN/GI: Normal diet, Protinix Prophylaxis: Lovenox   Disposition: Pending medical workup and speech eval.  Subjective:  Patient on room air, does admit to ongoing cough, especially during two periods last night. Denies pain.    Objective: Temp:  [97.5 F (36.4 C)-98.1 F (36.7 C)] 97.6 F (36.4 C) (07/21 0902) Pulse Rate:  [64-94] 85 (07/21 0902) Resp:  [16-20] 18 (07/21 0902) BP: (115-151)/(73-81) 151/81 (07/21 0902) SpO2:  [93 %-98 %] 97 % (07/21 0902) Physical Exam: General: Alert and oriented in no apparent distress Heart: Regular rate and rhythm with no murmurs appreciated Lungs: Course lung sounds, expiratory wheezing. Abdomen: Bowel sounds present, no abdominal pain Skin: Warm and dry Extremities: No lower extremity edema   Laboratory: Recent Labs  Lab 01/26/19 0741 01/27/19 0700 01/28/19 0728  WBC 9.6 8.2 6.1  HGB 9.9* 9.1* 9.3*  HCT 30.8* 27.9* 29.0*  PLT 225 212 219   Recent Labs  Lab 01/26/19 0741 01/27/19 0700 01/28/19 0728  NA 134* 135 137  K 3.9 3.5 3.4*  CL 97* 99 102  CO2 24 26 25   BUN 14 10  9  CREATININE 1.10 0.88 0.93  CALCIUM 9.0 8.4* 8.5*  GLUCOSE 314* 231* 230*    Imaging/Diagnostic Tests: Dg Chest Portable 1 View  Result Date: 01/25/2019 CLINICAL DATA:  Shortness of breath. EXAM: PORTABLE CHEST 1 VIEW COMPARISON:  Radiographs of January 03, 2019. FINDINGS: The heart size and mediastinal contours are within normal limits. Both lungs are clear. No pneumothorax or pleural effusion is noted. The visualized skeletal structures are unremarkable. IMPRESSION: No active disease. Electronically Signed   By: Marijo Conception M.D.   On: 01/25/2019 16:07     Lurline Del, MD 01/28/2019, 9:30 AM PGY-3, [place] Intern pager: [phone], text pages welcome

## 2019-01-28 NOTE — TOC Transition Note (Signed)
Transition of Care [facility]) - CM/SW Discharge Note   Patient Details  Name: Travis Beck MRN: [id number] Date of Birth: 1945-07-13  Transition of Care [facility]) CM/SW Contact:  Zenon Mayo, RN Phone Number: 01/28/2019, 1:45 PM   Clinical Narrative:    Patient for discharge today, NCM offered choice for HHPT/HHST, he states he does not want [place] services. He is refusing [place] services.    Final next level of care: [place] Barriers to Discharge: No Barriers Identified   Patient Goals and CMS Choice Patient states their goals for this hospitalization and ongoing recovery are:: go home CMS Medicare.gov Compare Post Acute Care list provided to:: Patient Choice offered to / list presented to : Patient  Discharge Placement                       Discharge Plan and Services                DME Arranged: (NA)         HH Arranged: OT, Speech Therapy, Refused HH          Social Determinants of Health (SDOH) Interventions     Readmission Risk Interventions No flowsheet data found.

## 2019-01-28 NOTE — Discharge Instructions (Signed)
Discharge summary:  - Please follow up with your PCP within one week for recheck, including new medications such as your blood pressure medicine - It is always a good idea to review your medications with your PCP and ensure you are taking them correctly. - Please return if you experience similar symptoms, high fevers, confusion, chest pain that does not subside within 15 minutes or shortness of breath. - Please follow up with Pulmonologist - Dr. Vaughan Browner or Carolynn Serve, NP in 2-3 weeks

## 2019-01-28 NOTE — Evaluation (Signed)
Speech Language Pathology Evaluation Patient Details Name: Travis Beck MRN: [id number] DOB: 01-Mar-1946 Today's Date: 01/28/2019 Time: 1035-1100 SLP Time Calculation (min) (ACUTE ONLY): 25 min  Problem List:  Patient Active Problem List   Diagnosis Date Noted  . Hypoxemia   . COPD exacerbation ([place]) 01/25/2019  . GERD (gastroesophageal reflux disease) 01/24/2019  . Hematemesis without nausea 08/20/2018  . Melena 08/20/2018  . Chronic bronchitis with emphysema ([place]) 05/07/2018  . Abnormal CT scan of lung 01/22/2018  . Pulmonary emphysema ([place]) 01/22/2018   Past Medical History:  Past Medical History:  Diagnosis Date  . Arthritis   . Asthma    Childhood  . Diabetes ([place])   . Hypertension    Past Surgical History:  Past Surgical History:  Procedure Laterality Date  . BIOPSY  08/30/2018   Procedure: BIOPSY;  Surgeon: Rogene Houston, MD;  Location[facility];  Service: Endoscopy;;  gastric   . COLONOSCOPY    . ESOPHAGOGASTRODUODENOSCOPY (EGD) WITH PROPOFOL N/A 08/30/2018   Procedure: ESOPHAGOGASTRODUODENOSCOPY (EGD) WITH PROPOFOL;  Surgeon: Rogene Houston, MD;  Location: [facility];  Service: Endoscopy;  Laterality: N/A;  1:30  . HERNIA REPAIR     bilateral lower abdomen   HPI:  Patient is a 73 y.o. male with PMH: COPD, DM, gastritis, HTN, HLD, anxiety, chronic pain, ongoing dysphagia with a recent h/o dyspnea and productive coughing, who was admitted to hospital with worsening of his symptoms. CXR did not reveal pulmonary edema or evidence of PNA. Per MD report, patient has been taking his inhalers incorrectly at home, likely due to poor cognitive ability.   Assessment / Plan / Recommendation Clinical Impression  Patient presents with a mild -mod cognitive impairment which is either chronic or mild exacerbation of chronic impairment. 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 Currently, he stated that  he lives with and cares for his brother, who is blind. He also stated that he still drives, goes to stores, etc. 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 When asked, patient stated that his daughter handles the finances "when I don't do it". He endorsed memory impairment and for most of delayed recall questions, he would say, "I can't remember any of that". He did not fully participate in the [facility], but for the questions he did participate in, he received a score of 9 out of 25, indicating a moderate cognitive impairment. SLP is recommending [facility] services to work on functional cogntive, problem solving and reasoning tasks. He would also benefit from intermittent supervision at home as well as assistance with financial and medication management. In addition, patient states he is at least partly a caregiver for his brother, who is blind, so concern regarding his ability to safely continue this level of care.    SLP Assessment  SLP Recommendation/Assessment: All further Speech Lanaguage Pathology  needs can be addressed in the next venue of care SLP Visit Diagnosis: Cognitive communication deficit (R41.841)    Follow Up Recommendations  Home health SLP    Frequency and Duration           SLP Evaluation Cognition  Overall Cognitive Status: No family/caregiver present to determine baseline cognitive functioning Arousal/Alertness: Awake/alert Orientation Level: Oriented to person;Oriented to place;Oriented to situation;Disoriented to time Attention: Sustained Sustained Attention: Appears intact Memory: Impaired Memory Impairment: Storage deficit;Decreased short term memory Decreased Short Term Memory: Verbal basic Awareness: Impaired Awareness Impairment: Emergent  impairment Problem Solving: Impaired Problem Solving Impairment: Verbal basic;Verbal complex Executive Function: Self Monitoring Self Monitoring: Impaired Self Monitoring Impairment: Verbal basic;Verbal  complex Behaviors: Perseveration Safety/Judgment: Impaired       Comprehension  Auditory Comprehension Overall Auditory Comprehension: Appears within functional limits for tasks assessed    Expression Expression Primary Mode of Expression: Verbal Verbal Expression Overall Verbal Expression: Appears within functional limits for tasks assessed   Oral / Motor  Oral Motor/Sensory Function Overall Oral Motor/Sensory Function: Within functional limits Motor Speech Overall Motor Speech: Appears within functional limits for tasks assessed   GO                    Travis Beck 01/28/2019, 12:30 PM   Sonia Baller, MA, CCC-SLP Speech Therapy [facility] Acute Rehab Pager: [phone]

## 2019-01-30 LAB — CULTURE, BLOOD (ROUTINE X 2)
Culture: NO GROWTH
Culture: NO GROWTH
Special Requests: ADEQUATE

## 2019-02-04 DIAGNOSIS — M545 Low back pain: Secondary | ICD-10-CM | POA: Diagnosis not present

## 2019-02-04 DIAGNOSIS — M130 Polyarthritis, unspecified: Secondary | ICD-10-CM | POA: Diagnosis not present

## 2019-02-04 DIAGNOSIS — M25569 Pain in unspecified knee: Secondary | ICD-10-CM | POA: Diagnosis not present

## 2019-02-04 DIAGNOSIS — M542 Cervicalgia: Secondary | ICD-10-CM | POA: Diagnosis not present

## 2019-02-12 ENCOUNTER — Ambulatory Visit (HOSPITAL_COMMUNITY): Payer: Medicare Other

## 2019-02-13 DIAGNOSIS — E119 Type 2 diabetes mellitus without complications: Secondary | ICD-10-CM | POA: Diagnosis not present

## 2019-02-13 DIAGNOSIS — I10 Essential (primary) hypertension: Secondary | ICD-10-CM | POA: Diagnosis not present

## 2019-02-14 ENCOUNTER — Other Ambulatory Visit (HOSPITAL_COMMUNITY): Payer: Medicare Other

## 2019-02-14 DIAGNOSIS — I10 Essential (primary) hypertension: Secondary | ICD-10-CM | POA: Diagnosis not present

## 2019-02-14 DIAGNOSIS — E785 Hyperlipidemia, unspecified: Secondary | ICD-10-CM | POA: Diagnosis not present

## 2019-02-14 DIAGNOSIS — Z299 Encounter for prophylactic measures, unspecified: Secondary | ICD-10-CM | POA: Diagnosis not present

## 2019-02-14 DIAGNOSIS — E1165 Type 2 diabetes mellitus with hyperglycemia: Secondary | ICD-10-CM | POA: Diagnosis not present

## 2019-02-14 DIAGNOSIS — J449 Chronic obstructive pulmonary disease, unspecified: Secondary | ICD-10-CM | POA: Diagnosis not present

## 2019-02-14 DIAGNOSIS — Z6826 Body mass index (BMI) 26.0-26.9, adult: Secondary | ICD-10-CM | POA: Diagnosis not present

## 2019-02-18 ENCOUNTER — Ambulatory Visit (INDEPENDENT_AMBULATORY_CARE_PROVIDER_SITE_OTHER): Payer: Medicare Other

## 2019-02-18 ENCOUNTER — Other Ambulatory Visit (INDEPENDENT_AMBULATORY_CARE_PROVIDER_SITE_OTHER): Payer: Medicare Other

## 2019-02-18 ENCOUNTER — Other Ambulatory Visit: Payer: Self-pay

## 2019-02-18 ENCOUNTER — Encounter: Payer: Self-pay | Admitting: Pulmonary Disease

## 2019-02-18 ENCOUNTER — Other Ambulatory Visit: Payer: Self-pay | Admitting: Pulmonary Disease

## 2019-02-18 ENCOUNTER — Telehealth: Payer: Self-pay | Admitting: Pulmonary Disease

## 2019-02-18 ENCOUNTER — Ambulatory Visit (INDEPENDENT_AMBULATORY_CARE_PROVIDER_SITE_OTHER): Payer: Medicare Other | Admitting: Pulmonary Disease

## 2019-02-18 VITALS — BP 114/60 | HR 76 | Temp 97.8°F | Ht 65.0 in | Wt 157.2 lb

## 2019-02-18 DIAGNOSIS — R05 Cough: Secondary | ICD-10-CM | POA: Diagnosis not present

## 2019-02-18 DIAGNOSIS — J439 Emphysema, unspecified: Secondary | ICD-10-CM

## 2019-02-18 DIAGNOSIS — I25118 Atherosclerotic heart disease of native coronary artery with other forms of angina pectoris: Secondary | ICD-10-CM | POA: Diagnosis not present

## 2019-02-18 DIAGNOSIS — R059 Cough, unspecified: Secondary | ICD-10-CM

## 2019-02-18 DIAGNOSIS — R918 Other nonspecific abnormal finding of lung field: Secondary | ICD-10-CM

## 2019-02-18 LAB — CBC WITH DIFFERENTIAL/PLATELET
Basophils Absolute: 0.1 10*3/uL (ref 0.0–0.1)
Basophils Relative: 2.1 % (ref 0.0–3.0)
Eosinophils Absolute: 1.4 10*3/uL — ABNORMAL HIGH (ref 0.0–0.7)
Eosinophils Relative: 25.2 % — ABNORMAL HIGH (ref 0.0–5.0)
HCT: 33.7 % — ABNORMAL LOW (ref 39.0–52.0)
Hemoglobin: 11 g/dL — ABNORMAL LOW (ref 13.0–17.0)
Lymphocytes Relative: 27.9 % (ref 12.0–46.0)
Lymphs Abs: 1.6 10*3/uL (ref 0.7–4.0)
MCHC: 32.8 g/dL (ref 30.0–36.0)
MCV: 84.5 fl (ref 78.0–100.0)
Monocytes Absolute: 0.4 10*3/uL (ref 0.1–1.0)
Monocytes Relative: 7.9 % (ref 3.0–12.0)
Neutro Abs: 2.1 10*3/uL (ref 1.4–7.7)
Neutrophils Relative %: 36.9 % — ABNORMAL LOW (ref 43.0–77.0)
Platelets: 396 10*3/uL (ref 150.0–400.0)
RBC: 3.99 Mil/uL — ABNORMAL LOW (ref 4.22–5.81)
RDW: 15.7 % — ABNORMAL HIGH (ref 11.5–15.5)
WBC: 5.7 10*3/uL (ref 4.0–10.5)

## 2019-02-18 MED ORDER — TRELEGY ELLIPTA 100-62.5-25 MCG/INH IN AEPB: 1.0000 | INHALATION_SPRAY | Freq: Every day | RESPIRATORY_TRACT | 0 refills | Status: DC

## 2019-02-18 MED ORDER — BENZONATATE 200 MG PO CAPS: 200.0000 mg | ORAL_CAPSULE | Freq: Three times a day (TID) | ORAL | 1 refills | Status: DC | PRN

## 2019-02-18 MED ORDER — TRELEGY ELLIPTA 100-62.5-25 MCG/INH IN AEPB: 1.0000 | INHALATION_SPRAY | Freq: Every day | RESPIRATORY_TRACT | 1 refills | Status: DC

## 2019-02-18 MED ORDER — PREDNISONE 20 MG PO TABS: 40.0000 mg | ORAL_TABLET | Freq: Every day | ORAL | 0 refills | Status: AC

## 2019-02-18 NOTE — Telephone Encounter (Signed)
We will hold off on calling in Hycodan as it appears the patient is already on buprenorphine and it can have drug interaction  Advised to use Robitussin and Delsym over-the-counter in addition to Tessalon If cough is not improved with prednisone, inhalers and antiacid medication then we will reassess.

## 2019-02-18 NOTE — Telephone Encounter (Signed)
Called and spoke with Patient's sister Travis Beck.  Travis Beck stated Patient was seen by Dr Vaughan Browner for cough this morning and was prescribed Tessalon.  Travis Beck stated Lavella Lemons is not helping his cough and wasn't any good for his cough, when prescribed by Tammy,NP. Travis Beck requested something stronger to be called in at [place].  Message routed to Dr Vaughan Browner to advise on cough med

## 2019-02-18 NOTE — Patient Instructions (Addendum)
We will get CBC differential, IgE and alpha-1 antitrypsin levels and phenotype today We will give prednisone 40 mg a day for 5 days We will start you on Trelegy inhaler Continue using the albuterol as needed Prescribe Tessalon 200 mg a day at night as needed for cough  You will need a follow-up CT in October 2020 Return to clinic after CT scan chest

## 2019-02-18 NOTE — Telephone Encounter (Signed)
We do not have the clearance to send in a controlled substance.  I have contacted the pt's sister and let her know that our office would be sending this in for the pt.  Dr. Vaughan Browner - please send in the prescription. Thanks.

## 2019-02-18 NOTE — Telephone Encounter (Signed)
Send in prescription for Hycodan PRN

## 2019-02-18 NOTE — Progress Notes (Signed)
Travis Beck    [id number]    [birth year]  Primary Care Physician:Shah, Weldon Picking, MD  Referring Physician: Monico Blitz, [address] [place],  [place] [id number]  Chief complaint: Follow up for emphysema, chronic bronchitis, abnormal CT scan  HPI: 73 year old with history of allergic rhinitis, hyperlipidemia, type 2 diabetes, former smoker Treated for pneumonia in May-June 2019.  He required 3 rounds of antibiotic to finally get symptoms under control.  He had imaging including CT of the chest which showed bilateral infiltrates, nodular opacities and has been referred here for further evaluation.  Follow-up CT shows resolution of infiltrate.  Pets: Has a dog, no cats, birds, farm animals Occupation: Used to work from [employer].  Currently retired Exposures: No known exposures, no leak, hot tub, Jacuzzi, mold Smoking history: 35-pack-year smoker.  Quit in 1994 Travel history: No significant travel.  Interim history: States that he has increasing problems with chronic cough, mucus with dyspnea, wheezing He was prescribed Symbicort inhaler at last visit but is not using it.  He just has albuterol as needed  Has significant issues with cough which is mostly at night and when he lies down.  He is followed by Dr. Corbin Ade, GI and was told he has esophageal ulcers from acid reflux.  He is on PPI twice daily He is also scheduled to see an allergist at [facility] in the next few weeks.  Outpatient Encounter Medications as of 02/18/2019  Medication Sig  . albuterol (VENTOLIN HFA) 108 (90 Base) MCG/ACT inhaler Inhale 2 puffs into the lungs every 6 (six) hours as needed for wheezing or shortness of breath.   . ALPRAZolam (XANAX) 1 MG tablet Take 1 tablet (1 mg total) by mouth 3 (three) times daily as needed for anxiety.  Marland Kitchen amLODipine (NORVASC) 5 MG tablet Take 1 tablet (5 mg total) by mouth daily.  . budesonide-formoterol (SYMBICORT) 80-4.5 MCG/ACT inhaler Inhale 2 puffs into the  lungs 2 (two) times daily.  . buprenorphine (SUBUTEX) 8 MG SUBL SL tablet Place 4 mg under the tongue 2 (two) times daily as needed for pain.  . fluticasone (FLONASE) 50 MCG/ACT nasal spray Place 2 sprays into both nostrils daily.   Marland Kitchen gabapentin (NEURONTIN) 400 MG capsule Take 1 capsule (400 mg total) by mouth every 8 (eight) hours as needed.  . glyBURIDE (DIABETA) 5 MG tablet Take 10 mg by mouth 2 (two) times daily with a meal.   . metFORMIN (GLUCOPHAGE) 1000 MG tablet Take 1,000 mg by mouth 2 (two) times daily with a meal.   . pantoprazole (PROTONIX) 40 MG tablet Take 1 tablet (40 mg total) by mouth 2 (two) times daily before a meal. (Patient taking differently: Take 40 mg by mouth 2 (two) times daily. )  . polyethylene glycol (MIRALAX / GLYCOLAX) 17 g packet Take 17 g by mouth daily as needed for mild constipation.  . rosuvastatin (CRESTOR) 20 MG tablet Take 1 tablet (20 mg total) by mouth daily.  . valsartan (DIOVAN) 80 MG tablet Take 80 mg by mouth daily.  . benzonatate (TESSALON PERLES) 100 MG capsule Take 1 capsule (100 mg total) by mouth 3 (three) times daily as needed for cough. (Patient not taking: Reported on 02/18/2019)  . benzonatate (TESSALON) 200 MG capsule Take 1 capsule (200 mg total) by mouth 3 (three) times daily as needed for cough. (Patient not taking: Reported on 02/18/2019)  . guaiFENesin (ROBITUSSIN) 100 MG/5ML SOLN Take 5 mLs (100 mg total)  by mouth every 4 (four) hours as needed for cough or to loosen phlegm. (Patient not taking: Reported on 02/18/2019)  . vitamin B-12 (CYANOCOBALAMIN) 500 MCG tablet Take 500 mcg by mouth daily.   No facility-administered encounter medications on file as of 02/18/2019.    Physical Exam: Blood pressure 114/60, pulse 76, temperature 97.8 F (36.6 C), temperature source Oral, height 5\' 5"  (1.651 m), weight 157 lb 3.2 oz (71.3 kg), SpO2 97 %. Gen:      No acute distress HEENT:  EOMI, sclera anicteric Neck:     No masses; no thyromegaly Lungs:    Bilateral expiratory wheeze CV:         Regular rate and rhythm; no murmurs Abd:      + bowel sounds; soft, non-tender; no palpable masses, no distension Ext:    No edema; adequate peripheral perfusion Skin:      Warm and dry; no rash Neuro: alert and oriented x 3 Psych: normal mood and affect  Data Reviewed: Imaging CT chest 12/21/2017- bilateral peribronchovascular nodularity, groundglass opacities left greater than right.  Dominant 1.1 cm left upper lobe nodule.  Mild centrilobular, paraseptal emphysema.  Left main and three-vessel coronary atherosclerosis.  Small hiatal hernia.    CT chest 04/25/2018- improvement in left upper lobe patchy airspace opacities, mild tree-in-bud in the upper lobes and lower lobes.  Peripheral subcentimeter pulmonary nodules.,  Aortic atherosclerosis I reviewed the images personally.  PFTs 04/17/2018 FVC 3.20 [92%), FEV1 2.45 [98%), F/F 77, TLC 125%, RV/DL 361%, DLCO 69% No obstruction, air trapping Mild reduction diffusion capacity.   Assessment:  Emphysema, chronic bronchitis with exacerbation PFTs did not show any significant obstruction but he does have emphysema and air trapping.  Has worsening symptoms with wheezing on examination We will give him a prednisone course for 5 days Start Trelegy inhaler, continue albuterol as needed Tessalon for cough  Check chest x-ray and baseline labs including CBC differential, IgE, alpha-1 antitrypsin levels and phenotype  Centimeter pulmonary nodule Follow-up CT in 1 year.  Schedule for October 2020  GERD, esophageal ulcers Suspect significant contribution from GERD to ongoing cough Follows with GI and is on PPI therapy.  Health maintenance States that he is up-to-date with Pneumovax at his primary care  Plan/Recommendations: - Prednisone 40 mg a day for 5 days - Trelegy inhaler, albuterol as needed - CBC, IgE, alpha-1 antitrypsin - Chest x-ray - Follow-up CT for lung nodule   Marshell Garfinkel MD  [facility] 02/18/2019, 9:24 AM  CC: Monico Blitz, MD

## 2019-02-19 ENCOUNTER — Telehealth: Payer: Self-pay | Admitting: Pulmonary Disease

## 2019-02-19 NOTE — Telephone Encounter (Signed)
Pt's sister returning call. Roosevelt Locks, [phone],  Cell:  [phone].

## 2019-02-19 NOTE — Telephone Encounter (Signed)
Spoke with sister and advised her of Dr. Vaughan Browner message. She understood and I advised her to wait after the CT scan is scheduled to schedule the appt or if he needed to be seen sooner if he doesn't get better to call our office. Nothing further is needed.

## 2019-02-19 NOTE — Telephone Encounter (Signed)
Marshell Garfinkel, MD    02/18/19 5:05 PM Note   We will hold off on calling in Hycodan as it appears the patient is already on buprenorphine and it can have drug interaction  Advised to use Robitussin and Delsym over-the-counter in addition to Tessalon If cough is not improved with prednisone, inhalers and antiacid medication then we will reassess.

## 2019-02-21 ENCOUNTER — Other Ambulatory Visit: Payer: Self-pay

## 2019-02-21 ENCOUNTER — Ambulatory Visit (HOSPITAL_COMMUNITY)
Admission: RE | Admit: 2019-02-21 | Discharge: 2019-02-21 | Disposition: A | Discharge status: Home / Self Care | Payer: Medicare Other | Source: Ambulatory Visit | Attending: Adult Health | Admitting: Adult Health

## 2019-02-21 DIAGNOSIS — J3489 Other specified disorders of nose and nasal sinuses: Secondary | ICD-10-CM | POA: Diagnosis not present

## 2019-02-21 DIAGNOSIS — R05 Cough: Secondary | ICD-10-CM | POA: Diagnosis not present

## 2019-02-21 DIAGNOSIS — J342 Deviated nasal septum: Secondary | ICD-10-CM | POA: Diagnosis not present

## 2019-02-21 DIAGNOSIS — R059 Cough, unspecified: Secondary | ICD-10-CM

## 2019-02-23 LAB — ALPHA-1 ANTITRYPSIN PHENOTYPE: A-1 Antitrypsin, Ser: 172 mg/dL (ref 83–199)

## 2019-02-23 LAB — IGE: IgE (Immunoglobulin E), Serum: 127 kU/L — ABNORMAL HIGH (ref <=114)

## 2019-02-24 ENCOUNTER — Telehealth: Payer: Self-pay | Admitting: Pulmonary Disease

## 2019-02-24 NOTE — Telephone Encounter (Signed)
[phone] dr Yvette Rack.. they called to leave Dr teal's number for you

## 2019-02-24 NOTE — Telephone Encounter (Signed)
Called and spoke with Lelan Pons [facility]) letting her know the results of pt's sinus CT scan. Stated to her that it pt was having significant sinus symptoms, that we could refer him to an ENT. Lelan Pons said that pt was going to see Dr. Yvette Rack 8/27? To have ears cleaned. I stated to her that we could send the results of the scan to her office so she could have it for when pt goes to see her that way she could see if anything needed to be done in regards to what was stated. Lelan Pons verbalized understanding.   Asked marie if she could provide an office phone number so we could call to get a fax number but she did not have it at that time. Lelan Pons said that she would call us back to provide [place] with that info.   When Lelan Pons returns call, please also schedule pt for a follow up appt in the next couple weeks per TP. Thanks!

## 2019-02-24 NOTE — Telephone Encounter (Signed)
They are closed for today, we can all tomorrow in the am.

## 2019-02-25 NOTE — Telephone Encounter (Signed)
After further investigating pt's ENT by the phone number, the ENT is actually Dr. Benjamine Mola not Dr. Yvette Rack. Fax number for the practice is [phone]. I have faxed pt's CT to Dr. Benjamine Mola. Nothing further needed

## 2019-02-27 ENCOUNTER — Telehealth: Payer: Self-pay | Admitting: Adult Health

## 2019-02-27 NOTE — Telephone Encounter (Signed)
Pt's sister, Lelan Pons.  [phone]

## 2019-02-27 NOTE — Telephone Encounter (Signed)
Left message for Travis Beck to call back. 

## 2019-02-27 NOTE — Telephone Encounter (Signed)
LMOM TCB x1 for patient's sister Lelan Pons to discuss ENT referral and schedule HFU  Result Notes for CT MAXILLOFACIAL WO CONTRAST Notes recorded by Pinion, Waldemar Dickens, CMA on 02/24/2019 at 4:53 PM EDT  Called and spoke with Lelan Pons letting her know the results of pt's sinus CT. Lelan Pons verbalized understanding. Nothing further needed.  ------   Notes recorded by Stephanie Coup, CMA on 02/24/2019 at 2:02 PM EDT  LM for patient's sister Roosevelt Locks to call back for results.  ------   Notes recorded by Melvenia Needles, NP on 02/24/2019 at 11:02 AM EDT  Sinus are clear .  Deviated septum -right sided with spur. -refer to ENT if has significant sinus symptoms .   He was recently admitted needs a post hospital follow up in next couple of weeks   Please contact office for sooner follow up if symptoms do not improve or worsen or seek emergency care

## 2019-02-28 ENCOUNTER — Encounter: Payer: Self-pay | Admitting: Allergy & Immunology

## 2019-02-28 ENCOUNTER — Other Ambulatory Visit: Payer: Self-pay

## 2019-02-28 ENCOUNTER — Ambulatory Visit (INDEPENDENT_AMBULATORY_CARE_PROVIDER_SITE_OTHER): Payer: Medicare Other | Admitting: Allergy & Immunology

## 2019-02-28 VITALS — BP 122/76 | HR 78 | Temp 98.0°F | Resp 18 | Ht 65.0 in | Wt 155.0 lb

## 2019-02-28 DIAGNOSIS — I25118 Atherosclerotic heart disease of native coronary artery with other forms of angina pectoris: Secondary | ICD-10-CM

## 2019-02-28 DIAGNOSIS — J449 Chronic obstructive pulmonary disease, unspecified: Secondary | ICD-10-CM

## 2019-02-28 DIAGNOSIS — J310 Chronic rhinitis: Secondary | ICD-10-CM | POA: Diagnosis not present

## 2019-02-28 NOTE — Patient Instructions (Addendum)
1. Chronic rhinitis - Testing today was non-reactive to the entire panel. - We are going to get blood work to confirm this. - We will call you in 1-2 weeks with the results of the testing. - Continue with: Flonase (fluticasone) one spray per nostril twice daily - Start taking: Xyzal (levocetirizine) 5mg  tablet once daily - You can use an extra dose of the antihistamine, if needed, for breakthrough symptoms.  - Consider nasal saline rinses 1-2 times daily to remove allergens from the nasal cavities as well as help with mucous clearance (this is especially helpful to do before the nasal sprays are given)  2. Asthma-COPD overlap syndrome - Lung testing looked fairly good today. - You were pretty noisy in your lungs, however.  - In the meantime, use up the Trelegy one puff once daily. - Once you are done with the Trelegy, start Symbicort 80/4.5 (this will likely be cheaper than the Trelegy). - Other options in the future include anti-Il5 agents (which target eosinophils, a white blood cell that make your breathing worse).  - Information on Fasenra provided today (they do have generous copay assistance and our Tammy will reach out to you to discuss more) - Spacer sample and demonstration provided. - Daily controller medication(s): Symbicort 80/4.71mcg two puffs twice daily with spacer - Prior to physical activity: albuterol 2 puffs 10-15 minutes before physical activity. - Rescue medications: albuterol 4 puffs every 4-6 hours as needed - Asthma control goals:  * Full participation in all desired activities (may need albuterol before activity) * Albuterol use two time or less a week on average (not counting use with activity) * Cough interfering with sleep two time or less a month * Oral steroids no more than once a year * No hospitalizations  3. Return in about 6 weeks (around 04/11/2019). This can be an in-person, a virtual Webex or a telephone follow up visit.   Please inform us of any  Emergency Department visits, hospitalizations, or changes in symptoms. Call us before going to the ED for breathing or allergy symptoms since we might be able to fit you in for a sick visit. Feel free to contact us anytime with any questions, problems, or concerns.  It was a pleasure to meet you and your family today!  Websites that have reliable patient information: 1. American Academy of Asthma, Allergy, and Immunology: www.aaaai.org 2. Food Allergy Research and Education (FARE): foodallergy.org 3. Mothers of Asthmatics: http://www.asthmacommunitynetwork.org 4. American College of Allergy, Asthma, and Immunology: www.acaai.org  "Like" [place] on Facebook and Instagram for our latest updates!      Make sure you are registered to vote! If you have moved or changed any of your contact information, you will need to get this updated before voting!  In some cases, you MAY be able to register to vote online: [email or web address].[email or web address]    Voter ID laws are NOT going into effect for the General Election in November 2020! DO NOT let this stop you from exercising your right to vote!   Absentee voting is the SAFEST way to vote during the coronavirus pandemic!   Download and print an absentee ballot request form at [email or web address] or you can scan the QR code below with your smart phone:      More information on absentee ballots can be found here: [email or web address]

## 2019-02-28 NOTE — Progress Notes (Signed)
NEW PATIENT  Date of Service/Encounter:  02/28/19  Referring provider: Monico Blitz, MD   Assessment:   Chronic rhinitis  Asthma-COPD overlap syndrome  Plan/Recommendations:   1. Chronic rhinitis - Testing today was non-reactive to the entire panel. - We are going to get blood work to confirm this. - We will call you in 1-2 weeks with the results of the testing. - Continue with: Flonase (fluticasone) one spray per nostril twice daily - Start taking: Xyzal (levocetirizine) 5mg  tablet once daily - You can use an extra dose of the antihistamine, if needed, for breakthrough symptoms.  - Consider nasal saline rinses 1-2 times daily to remove allergens from the nasal cavities as well as help with mucous clearance (this is especially helpful to do before the nasal sprays are given)  2. Asthma-COPD overlap syndrome - Lung testing looked fairly good today. - You were pretty noisy in your lungs, however.  - In the meantime, use up the Trelegy one puff once daily. - Once you are done with the Trelegy, start Symbicort 80/4.5 (this will likely be cheaper than the Trelegy). - Other options in the future include anti-Il5 agents (which target eosinophils, a white blood cell that make your breathing worse).  - Information on Fasenra provided today (they do have generous copay assistance and our Tammy will reach out to you to discuss more) - Spacer sample and demonstration provided. - Daily controller medication(s): Symbicort 80/4.62mcg two puffs twice daily with spacer - Prior to physical activity: albuterol 2 puffs 10-15 minutes before physical activity. - Rescue medications: albuterol 4 puffs every 4-6 hours as needed - Asthma control goals:  * Full participation in all desired activities (may need albuterol before activity) * Albuterol use two time or less a week on average (not counting use with activity) * Cough interfering with sleep two time or less a month * Oral steroids no more  than once a year * No hospitalizations  3. Return in about 6 weeks (around 04/11/2019). This can be an in-person, a virtual Webex or a telephone follow up visit.   Subjective:   Travis Beck is a 73 y.o. male presenting today for evaluation of  Chief Complaint  Patient presents with  . Shortness of Breath    Travis Beck has a history of the following: Patient Active Problem List   Diagnosis Date Noted  . Hypoxemia   . COPD exacerbation ([place]) 01/25/2019  . GERD (gastroesophageal reflux disease) 01/24/2019  . Hematemesis without nausea 08/20/2018  . Melena 08/20/2018  . Chronic bronchitis with emphysema ([place]) 05/07/2018  . Abnormal CT scan of lung 01/22/2018  . Pulmonary emphysema ([place]) 01/22/2018    History obtained from: chart review and patient and his wife.  The history is rather difficult to obtain, as both he and his wife are all over the place.  Travis Beck was referred by Monico Blitz, MD.     Travis Beck is a 73 y.o. male presenting for an evaluation of a chronic cough.    Asthma/Respiratory Symptom History: He has a history of COPD and is followed by Dr. Benay Pillow. He first presented with recurrent pneumonia. He was actually admitted to the hospital in July 2020. During that time, he received methylprednisolone, ipratropium, and albuterol.  He was treated with Augmentin in the hospital and then switched to oral azithromycin.  He did require oxygen up to 3.5 L.  Review of the notes from pulmonology shows that he was first seen in July  2019.  At that time, he had been treated for pneumonia for a couple of months in May and June 2019.  He had required 3 rounds of antibiotics to get symptoms under control.  A subsequent chest CT showed bilateral infiltrates and nodular opacities, which is what prompted the pulmonology referral in the first place.  At that visit, Dr. Vaughan Browner felt that the nodules on the CT were likely from pneumonia.  He wanted a follow-up  CT without contrast in October 2019.  The follow-up CT showed some improvement in the pneumonia.  His PFTs show no evidence of COPD.  He was started on a flutter valve for secretion clearance.  It seems that he was supposed to follow-up in 1 year, but presented in June 2020 instead for a sick visit.  At that visit, he was given doxycycline, prednisone, Mucinex, and Tessalon Perles.  He was also sent for COVID testing.  This was negative.  Shortly after that, he presented to the emergency room and was admitted.  Prior to 2019, he denies any kind of breathing problems at all.  He has a remote history of smoking but has stopped for several years now.  Allergic Rhinitis Symptom History: He is on Flonase twice daily. He has been on this for quite some time.  He did need antibiotics frequently over the last couple years.  He has never been allergy tested in the past.  Otherwise, there is no history of other atopic diseases, including food allergies, drug allergies, stinging insect allergies, eczema, urticaria or contact dermatitis. There is no significant infectious history. Vaccinations are up to date.    Past Medical History: Patient Active Problem List   Diagnosis Date Noted  . Hypoxemia   . COPD exacerbation ([place]) 01/25/2019  . GERD (gastroesophageal reflux disease) 01/24/2019  . Hematemesis without nausea 08/20/2018  . Melena 08/20/2018  . Chronic bronchitis with emphysema ([place]) 05/07/2018  . Abnormal CT scan of lung 01/22/2018  . Pulmonary emphysema ([place]) 01/22/2018    Medication List:  Allergies as of 02/28/2019      Reactions   Penicillins Rash   No problems with ampicillin during hospitalization      Medication List       Accurate as of February 28, 2019 11:59 PM. If you have any questions, ask your nurse or doctor.        STOP taking these medications   benzonatate 100 MG capsule Commonly known as: Best boy Stopped by: Valentina Shaggy, MD   benzonatate 200 MG  capsule Commonly known as: TESSALON Stopped by: Valentina Shaggy, MD   guaiFENesin 100 MG/5ML Soln Commonly known as: ROBITUSSIN Stopped by: Valentina Shaggy, MD     TAKE these medications   albuterol 108 (90 Base) MCG/ACT inhaler Commonly known as: VENTOLIN HFA Inhale 2 puffs into the lungs every 6 (six) hours as needed for wheezing or shortness of breath.   ALPRAZolam 1 MG tablet Commonly known as: XANAX Take 1 tablet (1 mg total) by mouth 3 (three) times daily as needed for anxiety.   amLODipine 5 MG tablet Commonly known as: NORVASC Take 1 tablet (5 mg total) by mouth daily.   buprenorphine 8 MG Subl SL tablet Commonly known as: SUBUTEX Place 4 mg under the tongue 2 (two) times daily as needed for pain.   fluticasone 50 MCG/ACT nasal spray Commonly known as: FLONASE Place 2 sprays into both nostrils daily.   gabapentin 400 MG capsule Commonly known as: NEURONTIN Take 1  capsule (400 mg total) by mouth every 8 (eight) hours as needed.   glyBURIDE 5 MG tablet Commonly known as: DIABETA Take 10 mg by mouth 2 (two) times daily with a meal.   metFORMIN 1000 MG tablet Commonly known as: GLUCOPHAGE Take 1,000 mg by mouth 2 (two) times daily with a meal.   pantoprazole 40 MG tablet Commonly known as: PROTONIX Take 1 tablet (40 mg total) by mouth 2 (two) times daily before a meal. What changed: when to take this   polyethylene glycol 17 g packet Commonly known as: MIRALAX / GLYCOLAX Take 17 g by mouth daily as needed for mild constipation.   rosuvastatin 20 MG tablet Commonly known as: CRESTOR Take 1 tablet (20 mg total) by mouth daily.   Trelegy Ellipta 100-62.5-25 MCG/INH Aepb Generic drug: Fluticasone-Umeclidin-Vilant Inhale 1 puff into the lungs daily.   valsartan 80 MG tablet Commonly known as: DIOVAN Take 80 mg by mouth daily.   vitamin B-12 500 MCG tablet Commonly known as: CYANOCOBALAMIN Take 500 mcg by mouth daily.   Vitamin D3 50 MCG  (2000 UT) Tabs Take 2,000 Units by mouth daily.       Birth History: non-contributory  Developmental History: non-contributory  Past Surgical History: Past Surgical History:  Procedure Laterality Date  . BIOPSY  08/30/2018   Procedure: BIOPSY;  Surgeon: Rogene Houston, MD;  [facility]: [facility];  Service: Endoscopy;;  gastric   . COLONOSCOPY    . ESOPHAGOGASTRODUODENOSCOPY (EGD) WITH PROPOFOL N/A 08/30/2018   Procedure: ESOPHAGOGASTRODUODENOSCOPY (EGD) WITH PROPOFOL;  Surgeon: Rogene Houston, MD;  Location: [facility];  Service: Endoscopy;  Laterality: N/A;  1:30  . HERNIA REPAIR     bilateral lower abdomen     Family History: History reviewed. No pertinent family history.   Social History: Lavere lives at home with his wife. There is one dog in the home. He is retired from [employer].    Review of Systems  Constitutional: Negative.  Negative for fever, malaise/fatigue and weight loss.  HENT: Negative.  Negative for congestion, ear discharge, ear pain and nosebleeds.   Eyes: Negative for pain, discharge and redness.  Respiratory: Positive for cough, sputum production and shortness of breath. Negative for wheezing.   Cardiovascular: Negative.  Negative for chest pain and palpitations.  Gastrointestinal: Negative for abdominal pain, constipation, diarrhea, heartburn, nausea and vomiting.  Skin: Negative.  Negative for itching and rash.  Neurological: Negative for dizziness and headaches.  Endo/Heme/Allergies: Negative for environmental allergies. Does not bruise/bleed easily.       Objective:   Blood pressure 122/76, pulse 78, temperature 98 F (36.7 C), temperature source Temporal, resp. rate 18, height 5\' 5"  (1.651 m), weight 155 lb (70.3 kg), SpO2 95 %. Body mass index is 25.79 kg/m.   Physical Exam:   Physical Exam  Constitutional: He appears well-developed.  Very pleasant. Poor historian.   HENT:  Head: Normocephalic and atraumatic.   Right Ear: Tympanic membrane, external ear and ear canal normal. No drainage, swelling or tenderness. Tympanic membrane is not injected, not scarred, not erythematous, not retracted and not bulging.  Left Ear: Tympanic membrane, external ear and ear canal normal. No drainage, swelling or tenderness. Tympanic membrane is not injected, not scarred, not erythematous, not retracted and not bulging.  Nose: Mucosal edema and rhinorrhea present. No nasal deformity or septal deviation. No epistaxis. Right sinus exhibits no maxillary sinus tenderness and no frontal sinus tenderness. Left sinus exhibits no maxillary sinus tenderness  and no frontal sinus tenderness.  Mouth/Throat: Uvula is midline and oropharynx is clear and moist. Mucous membranes are not pale and not dry.  Mild cobblestoning present in the posterior oropharynx.   Eyes: Pupils are equal, round, and reactive to light. Conjunctivae and EOM are normal. Right eye exhibits no chemosis and no discharge. Left eye exhibits no chemosis and no discharge. Right conjunctiva is not injected. Left conjunctiva is not injected.  Cardiovascular: Normal rate, regular rhythm and normal heart sounds.  Respiratory: Effort normal and breath sounds normal. No accessory muscle usage. No tachypnea. No respiratory distress. He has no wheezes. He has no rhonchi. He has no rales. He exhibits no tenderness.  GI: There is no abdominal tenderness. There is no rebound and no guarding.  Lymphadenopathy:       Head (right side): No submandibular, no tonsillar and no occipital adenopathy present.       Head (left side): No submandibular, no tonsillar and no occipital adenopathy present.    He has no cervical adenopathy.  Neurological: He is alert.  Skin: No abrasion, no petechiae and no rash noted. Rash is not papular, not vesicular and not urticarial. No erythema. No pallor.  Multiple bruises noted on arms.   Psychiatric: He has a normal mood and affect.     Diagnostic  studies:    Spirometry: results normal (FEV1: 2.04/88%, FVC: 2.77/79%, FEV1/FVC: 73%).    Spirometry consistent with normal pattern.    Allergy Studies:    Airborne Adult Perc - 02/28/19 1518    Time Antigen Placed  0300    Allergen Manufacturer  Lavella Hammock    Location  Back    Number of Test  59    Panel 1  Select    1. Control-Buffer 50% Glycerol  Negative    2. Control-Histamine 1 mg/ml  2+    3. Albumin saline  Negative    4. [place]  Negative    5. [place]  Negative    6. [place]  Negative    7. [place] Blue  Negative    8. Meadow Fescue  Negative    9. Perennial Rye  Negative    10. Sweet Vernal  Negative    11. Timothy  Negative    12. Cocklebur  Negative    13. Burweed Marshelder  Negative    14. Ragweed, short  Negative    15. Ragweed, Giant  Negative    16. Plantain,  English  Negative    17. Lamb's Quarters  Negative    18. Sheep Sorrell  Negative    19. Rough Pigweed  Negative    20. Marsh Elder, Rough  Negative    21. Mugwort, Common  Negative    22. Ash mix  Negative    23. Birch mix  Negative    24. Beech American  Negative    25. Box, Elder  Negative    26. Cedar, red  Negative    27. Cottonwood, [place]  Negative    28. Elm mix  Negative    29. Hickory mix  Negative    30. Maple mix  Negative    31. Oak, [place] mix  Negative    32. Pecan Pollen  Negative    33. Pine mix  Negative    34. Sycamore Eastern  Negative    35. Aguilar, Black Pollen  Negative    36. Alternaria alternata  Negative    37. Cladosporium Herbarum  Negative    38. Aspergillus  mix  Negative    39. Penicillium mix  Negative    40. Bipolaris sorokiniana (Helminthosporium)  Negative    41. Drechslera spicifera (Curvularia)  Negative    42. Mucor plumbeus  Negative    43. Fusarium moniliforme  Negative    44. Aureobasidium pullulans (pullulara)  Negative    45. Rhizopus oryzae  Negative    46. Botrytis cinera  Negative    47. Epicoccum nigrum  Negative    48. Phoma betae  Negative     49. Candida Albicans  Negative    50. Trichophyton mentagrophytes  Negative    51. Mite, D Farinae  5,000 AU/ml  Negative    52. Mite, D Pteronyssinus  5,000 AU/ml  Negative    53. Cat Hair 10,000 BAU/ml  Negative    54.  Dog Epithelia  Negative    55. Mixed Feathers  Negative    56. Horse Epithelia  Negative    57. Cockroach, German  Negative    58. Mouse  Negative    59. Tobacco Leaf  Negative       Allergy testing results were read and interpreted by myself, documented by clinical staff.         Salvatore Marvel, MD [facility] [place]

## 2019-03-03 ENCOUNTER — Encounter: Payer: Self-pay | Admitting: Allergy & Immunology

## 2019-03-06 ENCOUNTER — Ambulatory Visit (INDEPENDENT_AMBULATORY_CARE_PROVIDER_SITE_OTHER): Payer: Medicare Other | Admitting: Otolaryngology

## 2019-03-06 DIAGNOSIS — J449 Chronic obstructive pulmonary disease, unspecified: Secondary | ICD-10-CM | POA: Diagnosis not present

## 2019-03-06 DIAGNOSIS — H900 Conductive hearing loss, bilateral: Secondary | ICD-10-CM

## 2019-03-06 DIAGNOSIS — J310 Chronic rhinitis: Secondary | ICD-10-CM | POA: Diagnosis not present

## 2019-03-06 DIAGNOSIS — J342 Deviated nasal septum: Secondary | ICD-10-CM | POA: Diagnosis not present

## 2019-03-06 DIAGNOSIS — H6123 Impacted cerumen, bilateral: Secondary | ICD-10-CM | POA: Diagnosis not present

## 2019-03-06 NOTE — Addendum Note (Signed)
Addended by: Valentina Shaggy on: 03/06/2019 10:25 AM   Modules accepted: Orders

## 2019-03-07 NOTE — Telephone Encounter (Signed)
Called spoke with patient's sister Travis Beck.  Per Travis Beck patient followed up with Dr Melene Plan w/ ENT yesterday at their [facility] office and received a good report on his sinuses.  Discussed scheduling a HFU appt and Travis Beck stated that patient is due for follow up with Dr Vaughan Browner in October with CT prior and she would like to keep these appts - does not want to be seen sooner.  Travis Beck to please call the office if patient develops any symptoms.  Nothing further needed at this time; will sign off.

## 2019-03-09 LAB — IGE+ALLERGENS ZONE 2(30)

## 2019-03-09 LAB — ALLERGEN PROFILE, MOLD
Aureobasidi Pullulans IgE: <0.1 kU/L
Candida Albicans IgE: <0.1 kU/L
M009-IgE Fusarium proliferatum: <0.1 kU/L
M014-IgE Epicoccum purpur: <0.1 kU/L
Phoma Betae IgE: <0.1 kU/L
Setomelanomma Rostrat: <0.1 kU/L

## 2019-03-11 ENCOUNTER — Other Ambulatory Visit: Payer: Self-pay | Admitting: Cardiovascular Disease

## 2019-03-12 LAB — ASPERGILLUS PRECIPITINS
A.Fumigatus #1 Abs: NEGATIVE
A.Fumigatus #1 Abs: NEGATIVE
Aspergillus Flavus Antibodies: NEGATIVE
Aspergillus Flavus Antibodies: NEGATIVE
Aspergillus Niger Antibodies: NEGATIVE
Aspergillus Niger Antibodies: NEGATIVE
Aspergillus glaucus IgG: NEGATIVE
Aspergillus glaucus IgG: NEGATIVE
Aspergillus nidulans IgG: NEGATIVE
Aspergillus nidulans IgG: NEGATIVE
Aspergillus terreus IgG: NEGATIVE
Aspergillus terreus IgG: NEGATIVE

## 2019-03-12 LAB — SPECIMEN STATUS REPORT

## 2019-03-13 DIAGNOSIS — E119 Type 2 diabetes mellitus without complications: Secondary | ICD-10-CM | POA: Diagnosis not present

## 2019-03-13 DIAGNOSIS — I10 Essential (primary) hypertension: Secondary | ICD-10-CM | POA: Diagnosis not present

## 2019-03-30 ENCOUNTER — Other Ambulatory Visit: Payer: Self-pay | Admitting: Cardiovascular Disease

## 2019-03-31 DIAGNOSIS — M25569 Pain in unspecified knee: Secondary | ICD-10-CM | POA: Diagnosis not present

## 2019-03-31 DIAGNOSIS — M542 Cervicalgia: Secondary | ICD-10-CM | POA: Diagnosis not present

## 2019-03-31 DIAGNOSIS — M130 Polyarthritis, unspecified: Secondary | ICD-10-CM | POA: Diagnosis not present

## 2019-03-31 DIAGNOSIS — M545 Low back pain: Secondary | ICD-10-CM | POA: Diagnosis not present

## 2019-04-01 DIAGNOSIS — Z87891 Personal history of nicotine dependence: Secondary | ICD-10-CM | POA: Diagnosis not present

## 2019-04-01 DIAGNOSIS — J441 Chronic obstructive pulmonary disease with (acute) exacerbation: Secondary | ICD-10-CM | POA: Diagnosis not present

## 2019-04-01 DIAGNOSIS — Z299 Encounter for prophylactic measures, unspecified: Secondary | ICD-10-CM | POA: Diagnosis not present

## 2019-04-01 DIAGNOSIS — Z6827 Body mass index (BMI) 27.0-27.9, adult: Secondary | ICD-10-CM | POA: Diagnosis not present

## 2019-04-01 DIAGNOSIS — J449 Chronic obstructive pulmonary disease, unspecified: Secondary | ICD-10-CM | POA: Diagnosis not present

## 2019-04-01 DIAGNOSIS — I10 Essential (primary) hypertension: Secondary | ICD-10-CM | POA: Diagnosis not present

## 2019-04-07 DIAGNOSIS — L570 Actinic keratosis: Secondary | ICD-10-CM | POA: Diagnosis not present

## 2019-04-07 DIAGNOSIS — D485 Neoplasm of uncertain behavior of skin: Secondary | ICD-10-CM | POA: Diagnosis not present

## 2019-04-10 DIAGNOSIS — Z23 Encounter for immunization: Secondary | ICD-10-CM | POA: Diagnosis not present

## 2019-04-15 DIAGNOSIS — L570 Actinic keratosis: Secondary | ICD-10-CM | POA: Diagnosis not present

## 2019-04-16 DIAGNOSIS — I10 Essential (primary) hypertension: Secondary | ICD-10-CM | POA: Diagnosis not present

## 2019-04-16 DIAGNOSIS — E119 Type 2 diabetes mellitus without complications: Secondary | ICD-10-CM | POA: Diagnosis not present

## 2019-04-18 ENCOUNTER — Ambulatory Visit: Payer: Medicare Other | Admitting: Allergy & Immunology

## 2019-04-22 ENCOUNTER — Ambulatory Visit (HOSPITAL_COMMUNITY)
Admission: RE | Admit: 2019-04-22 | Discharge: 2019-04-22 | Disposition: A | Discharge status: Home / Self Care | Payer: Medicare Other | Source: Ambulatory Visit | Attending: Pulmonary Disease | Admitting: Pulmonary Disease

## 2019-04-22 ENCOUNTER — Other Ambulatory Visit: Payer: Self-pay

## 2019-04-22 DIAGNOSIS — R918 Other nonspecific abnormal finding of lung field: Secondary | ICD-10-CM

## 2019-04-25 ENCOUNTER — Ambulatory Visit (INDEPENDENT_AMBULATORY_CARE_PROVIDER_SITE_OTHER): Payer: Medicare Other | Admitting: Allergy & Immunology

## 2019-04-25 ENCOUNTER — Telehealth: Payer: Self-pay

## 2019-04-25 ENCOUNTER — Other Ambulatory Visit: Payer: Self-pay

## 2019-04-25 ENCOUNTER — Encounter: Payer: Self-pay | Admitting: Allergy & Immunology

## 2019-04-25 VITALS — BP 122/70 | HR 71 | Temp 98.2°F | Resp 16

## 2019-04-25 DIAGNOSIS — J310 Chronic rhinitis: Secondary | ICD-10-CM

## 2019-04-25 DIAGNOSIS — J449 Chronic obstructive pulmonary disease, unspecified: Secondary | ICD-10-CM

## 2019-04-25 DIAGNOSIS — I25118 Atherosclerotic heart disease of native coronary artery with other forms of angina pectoris: Secondary | ICD-10-CM

## 2019-04-25 MED ORDER — LEVOCETIRIZINE DIHYDROCHLORIDE 5 MG PO TABS: 5.0000 mg | ORAL_TABLET | Freq: Every evening | ORAL | 1 refills | Status: DC

## 2019-04-25 NOTE — Progress Notes (Signed)
FOLLOW UP  Date of Service/Encounter:  04/25/19   Assessment:   Chronic non-allergic rhinitis  Asthma-COPD overlap syndrome   [name] presents for follow-up visit.  From a pulmonary perspective, he is in much better shape than when I first met him.  It is difficult to get a sense of how well his control is over the long-term.  His wife reports that he gets antibiotics and steroids around 2-3 times per year.  Because of this, I truly think he needs to be on a daily controller medication.  Cost however continues to be an issue.  I think the most cost effective daily controller medication would likely be Pulmicort since it is such an old drug.  However, he refuses to use anything on a daily basis.  This is in spite of the fact that he uses albuterol multiple times a day, so he is essentially using something daily anyway.  I tried to explain that the amount of steroid and the Pulmicort is drastically less than what he would be receiving from his multiple prednisone burst.  This did not seem to phase him.  As he refuses to take any controller medication, I will think I need to see him more than annually if that.  He has gone to see pulmonology as well, so maybe they will have a different plan.  Plan/Recommendations:   1. Chronic rhinitis - Testing on the skin and the blood.  - Continue with: Flonase (fluticasone) one spray per nostril twice daily and Xyzal 39m daily - You can use an extra dose of the antihistamine, if needed, for breakthrough symptoms.  - Consider nasal saline rinses 1-2 times daily to remove allergens from the nasal cavities as well as help with mucous clearance (this is especially helpful to do before the nasal sprays are given). - Saline rinse bottle provided.  2. Asthma-COPD overlap syndrome - Lung testing looked fairly good today. - Consider starting a daily inhaled steroid (Pulmicort).  - Daily controller medication(s): NOTHING - Prior to physical activity:  albuterol 2 puffs 10-15 minutes before physical activity. - Rescue medications: albuterol 4 puffs every 4-6 hours as needed or albuterol nebulizer one vial every 4-6 hours as needed - Asthma control goals:  * Full participation in all desired activities (may need albuterol before activity) * Albuterol use two time or less a week on average (not counting use with activity) * Cough interfering with sleep two time or less a month * Oral steroids no more than once a year * No hospitalizations  3. Return in about 1 year (around 04/24/2020). This can be an in-person, a virtual Webex or a telephone follow up visit.    Subjective:   Travis Batheis a 73y.o. male presenting today for follow up of  Chief Complaint  Patient presents with  . Nasal Congestion    3 weeks ago doctors office prednisone given    PFaaris Arizpehas a history of the following: Patient Active Problem List   Diagnosis Date Noted  . Hypoxemia   . [place] 01/25/2019  . GERD (gastroesophageal reflux disease) 01/24/2019  . Hematemesis without nausea 08/20/2018  . Melena 08/20/2018  . Chronic bronchitis with [place]ysema ([place] 05/07/2018  . Abnormal CT scan of lung 01/22/2018  . [place] 01/22/2018    History obtained from: chart review and patient.  [name] a 73y.o. male presenting for a follow up visit.  He was last seen as a new  patient in August 2020.  At that time, we did do testing that was nonreactive to the entire panel.  We did obtain blood work to confirm this.  We continued his Flonase and started Xyzal 5 mg daily.  Regarding his breathing, his lung testing looked fairly good.  We recommended using up to Trelegy 1 puff once daily.  Once the Trelegy was finished, we recommended starting Symbicort 80 mcg / 4.5 mcg 2 puffs twice daily.  He was having difficulty affording his Trelegy, so we are hoping that the Symbicort would be cheaper  He did have an episode three  weeks ago and was given prednisone and antibiotics.   Asthma/Respiratory Symptom History: He does have an albuterol nebulizer machine at home. He uses the macihine fairly often. He is unable to quantify it exactly. They were not able to afford Trelegy, which was going to cost him $400 out-of-pocket.  Even the Symbicort was almost $100.  He tells me today that the albuterol works fine for him.  It is very difficult for me to quantify how much he is using it.  They are very vague with this.  It seems that he uses it at least twice per day prior to using the flutter valve.  This does seem like an appropriate use of the albuterol, but then there are other days when he uses it even more.  They declined to use any of the alternatives that come up with, including nebulized Pulmicort, Flovent, or other inhaled steroids.  It all comes down to the cost.  There also seems to be a steroid phobia as well, as every time I mentioned to him both he and his wife cringe.  However, over all, he reports feeling pretty good today.  Non-Allergic Rhinitis Symptom History: He does get congestion some of the time, but it not a big issue. He has already seen ENT. Dr. Benjamine Mola did not seem to care about the septal deviation.  He is going to see Dr. Benjamine Mola again in 1 year.  He does use the fluticasone.  Otherwise, there have been no changes to his past medical history, surgical history, family history, or social history.    Review of Systems  Constitutional: Negative.  Negative for fever, malaise/fatigue and weight loss.  HENT: Negative.  Negative for congestion, ear discharge and ear pain.   Eyes: Negative for pain, discharge and redness.  Respiratory: Negative for cough, sputum production, shortness of breath and wheezing.   Cardiovascular: Negative.  Negative for chest pain and palpitations.  Gastrointestinal: Negative for abdominal pain, heartburn, nausea and vomiting.  Skin: Negative.  Negative for itching and rash.   Neurological: Negative for dizziness and headaches.  Endo/Heme/Allergies: Negative for environmental allergies. Does not bruise/bleed easily.       Objective:   Blood pressure 122/70, pulse 71, temperature 98.2 F (36.8 C), temperature source Temporal, resp. rate 16, SpO2 96 %. There is no height or weight on file to calculate BMI.   Physical Exam:  Physical Exam  Constitutional: He appears well-developed.  HENT:  Head: Normocephalic and atraumatic.  Right Ear: Tympanic membrane, external ear and ear canal normal. No drainage, swelling or tenderness. Tympanic membrane is not injected, not scarred, not erythematous, not retracted and not bulging.  Left Ear: Tympanic membrane, external ear and ear canal normal. No drainage, swelling or tenderness. Tympanic membrane is not injected, not scarred, not erythematous, not retracted and not bulging.  Nose: No mucosal edema, rhinorrhea, nasal deformity or septal deviation. No  epistaxis. Right sinus exhibits no maxillary sinus tenderness and no frontal sinus tenderness. Left sinus exhibits no maxillary sinus tenderness and no frontal sinus tenderness.  Mouth/Throat: Uvula is midline and oropharynx is clear and moist. Mucous membranes are not pale and not dry.  Eyes: Pupils are equal, round, and reactive to light. Conjunctivae and EOM are normal. Right eye exhibits no chemosis and no discharge. Left eye exhibits no chemosis and no discharge. Right conjunctiva is not injected. Left conjunctiva is not injected.  Cardiovascular: Normal rate, regular rhythm and normal heart sounds.  Respiratory: Effort normal and breath sounds normal. No accessory muscle usage. No tachypnea. No respiratory distress. He has no wheezes. He has no rhonchi. He has no rales. He exhibits no tenderness.  Lung   GI: There is no abdominal tenderness. There is no rebound and no guarding.  Lymphadenopathy:       Head (right side): No submandibular, no tonsillar and no occipital  adenopathy present.       Head (left side): No submandibular, no tonsillar and no occipital adenopathy present.    He has no cervical adenopathy.  Neurological: He is alert.  Skin: No abrasion, no petechiae and no rash noted. Rash is not papular, not vesicular and not urticarial. No erythema. No pallor.  Psychiatric: He has a normal mood and affect.     Diagnostic studies:    Spirometry: results normal (FEV1: 1.97/96%, FVC: 2.82/94%, FEV1/FVC: 69%).    Spirometry consistent with normal pattern.   Allergy Studies: none      Salvatore Marvel, MD  [facility] [place]

## 2019-04-25 NOTE — Patient Instructions (Addendum)
1. Chronic rhinitis - Testing on the skin and the blood.  - Continue with: Flonase (fluticasone) one spray per nostril twice daily and Xyzal 5mg  daily - You can use an extra dose of the antihistamine, if needed, for breakthrough symptoms.  - Consider nasal saline rinses 1-2 times daily to remove allergens from the nasal cavities as well as help with mucous clearance (this is especially helpful to do before the nasal sprays are given). - Saline rinse bottle provided.  2. Asthma-COPD overlap syndrome - Lung testing looked fairly good today. - Consider starting a daily inhaled steroid (Pulmicort).  - Daily controller medication(s): NOTHING - Prior to physical activity: albuterol 2 puffs 10-15 minutes before physical activity. - Rescue medications: albuterol 4 puffs every 4-6 hours as needed or albuterol nebulizer one vial every 4-6 hours as needed - Asthma control goals:  * Full participation in all desired activities (may need albuterol before activity) * Albuterol use two time or less a week on average (not counting use with activity) * Cough interfering with sleep two time or less a month * Oral steroids no more than once a year * No hospitalizations  3. Return in about 1 year (around 04/24/2020). This can be an in-person, a virtual Webex or a telephone follow up visit.   Please inform us of any Emergency Department visits, hospitalizations, or changes in symptoms. Call us before going to the ED for breathing or allergy symptoms since we might be able to fit you in for a sick visit. Feel free to contact us anytime with any questions, problems, or concerns.  It was a pleasure to meet you and your family today!  Websites that have reliable patient information: 1. American Academy of Asthma, Allergy, and Immunology: www.aaaai.org 2. Food Allergy Research and Education (FARE): foodallergy.org 3. Mothers of Asthmatics: http://www.asthmacommunitynetwork.org 4. American College of Allergy,  Asthma, and Immunology: www.acaai.org  "Like" [place] on Facebook and Instagram for our latest updates!      Make sure you are registered to vote! If you have moved or changed any of your contact information, you will need to get this updated before voting!  In some cases, you MAY be able to register to vote online: [email or web address].it    Voter ID laws are NOT going into effect for the General Election in November 2020! DO NOT let this stop you from exercising your right to vote!   Absentee voting is the SAFEST way to vote during the coronavirus pandemic!   Download and print an absentee ballot request form at [email or web address]-[email or web address]-Request or you can scan the QR code below with your smart phone:      More information on absentee ballots can be found here: [email or web address]

## 2019-04-25 NOTE — Telephone Encounter (Signed)
That is fine with me.  Maddyson Keil, MD [facility]  

## 2019-04-25 NOTE — Telephone Encounter (Signed)
Patient stated that Xyzal was not the medication that caused him to be sleepy so would like refills.

## 2019-04-28 MED ORDER — LEVOCETIRIZINE DIHYDROCHLORIDE 5 MG PO TABS: 5.0000 mg | ORAL_TABLET | Freq: Every evening | ORAL | 1 refills | Status: DC

## 2019-04-28 NOTE — Telephone Encounter (Signed)
Rx Xyzal sent to pharmacy and attempted to notify patient, unable to reach due to service.

## 2019-04-28 NOTE — Addendum Note (Signed)
Addended by: Valere Dross on: 04/28/2019 09:11 AM   Modules accepted: Orders

## 2019-04-28 NOTE — Addendum Note (Signed)
Addended by: Lucrezia Starch I on: 04/28/2019 11:53 AM   Modules accepted: Orders

## 2019-04-28 NOTE — Telephone Encounter (Signed)
Called patient and informed. Patient verbalized understanding.  

## 2019-05-05 DIAGNOSIS — L0291 Cutaneous abscess, unspecified: Secondary | ICD-10-CM | POA: Diagnosis not present

## 2019-05-05 DIAGNOSIS — I10 Essential (primary) hypertension: Secondary | ICD-10-CM | POA: Diagnosis not present

## 2019-05-05 DIAGNOSIS — Z299 Encounter for prophylactic measures, unspecified: Secondary | ICD-10-CM | POA: Diagnosis not present

## 2019-05-05 DIAGNOSIS — Z6827 Body mass index (BMI) 27.0-27.9, adult: Secondary | ICD-10-CM | POA: Diagnosis not present

## 2019-05-05 DIAGNOSIS — E1165 Type 2 diabetes mellitus with hyperglycemia: Secondary | ICD-10-CM | POA: Diagnosis not present

## 2019-05-06 ENCOUNTER — Other Ambulatory Visit: Payer: Self-pay

## 2019-05-06 ENCOUNTER — Other Ambulatory Visit (INDEPENDENT_AMBULATORY_CARE_PROVIDER_SITE_OTHER): Payer: Self-pay | Admitting: *Deleted

## 2019-05-06 ENCOUNTER — Encounter (INDEPENDENT_AMBULATORY_CARE_PROVIDER_SITE_OTHER): Payer: Self-pay | Admitting: *Deleted

## 2019-05-06 ENCOUNTER — Encounter (INDEPENDENT_AMBULATORY_CARE_PROVIDER_SITE_OTHER): Payer: Self-pay | Admitting: Internal Medicine

## 2019-05-06 ENCOUNTER — Ambulatory Visit (INDEPENDENT_AMBULATORY_CARE_PROVIDER_SITE_OTHER): Payer: Medicare Other | Admitting: Internal Medicine

## 2019-05-06 ENCOUNTER — Other Ambulatory Visit: Payer: Self-pay | Admitting: Cardiovascular Disease

## 2019-05-06 VITALS — BP 126/77 | HR 78 | Temp 98.2°F | Ht 65.0 in | Wt 157.0 lb

## 2019-05-06 DIAGNOSIS — R1310 Dysphagia, unspecified: Secondary | ICD-10-CM

## 2019-05-06 DIAGNOSIS — K2100 Gastro-esophageal reflux disease with esophagitis, without bleeding: Secondary | ICD-10-CM

## 2019-05-06 DIAGNOSIS — I25118 Atherosclerotic heart disease of native coronary artery with other forms of angina pectoris: Secondary | ICD-10-CM

## 2019-05-06 DIAGNOSIS — R1319 Other dysphagia: Secondary | ICD-10-CM

## 2019-05-06 DIAGNOSIS — D649 Anemia, unspecified: Secondary | ICD-10-CM | POA: Diagnosis not present

## 2019-05-06 NOTE — Progress Notes (Signed)
Presenting complaint;  Follow-up for chronic GERD and anemia.  Database and subjective:  Patient is 73 year old Caucasian male who has history of diabetes mellitus, chronic bronchitis emphysema as well as chronic low back pain with need for pain medication who was seen in the office in February 2020 for dysphagia and history of hematemesis.  At that time office visit his hemoglobin was 10.1.  He underwent esophagogastroduodenoscopy on 08/30/2018.  Hemoglobin was repeated on the day of procedure it was 8.8 g.  He had grade C reflux esophagitis noncritical Schatzki's ring and a small sliding hiatal hernia.  He had gastritis but biopsy was negative for H. pylori.  Patient was begun on double dose PPI.  Patient was last seen in the office 6 months ago and he was doing well. His hemoglobin was up to 11.5.  Patient is accompanied by his sister Ms. Roosevelt Locks. Patient states he rarely has heartburn.  He is still having occasional dysphagia with solids.  He had an episode about 3 months ago when he was eating hamburger and it took him about 10 minutes before he got relief.  He passed the bolus distally spontaneously.  He denies postprandial fullness or early satiety.  He is not having any side effects with medication.  He has gained 6 pounds since his last visit.  He feels weight gain is due to the fact that he was on steroid for breathing issues.  His appetite is good.  He is prone to constipation.  He has at least 3 bowel movements per week.  He is using polyethylene glycol on as-needed basis.  He denies melena or rectal bleeding. He has chronic low back pain.  He uses pain medication on as-needed basis.  He takes gabapentin for peripheral neuropathy.  He usually takes 2 doses per day. Patient states he is doing well as far as his breathing is concerned.  He may use albuterol inhaler once or twice a week. Patient states he is scheduled to undergo annual physical exam by Dr. Manuella Ghazi later this week and he  will have complete blood count.  Current Medications: Outpatient Encounter Medications as of 05/06/2019  Medication Sig  . albuterol (VENTOLIN HFA) 108 (90 Base) MCG/ACT inhaler Inhale 2 puffs into the lungs every 6 (six) hours as needed for wheezing or shortness of breath.   . ALPRAZolam (XANAX) 1 MG tablet Take 1 tablet (1 mg total) by mouth 3 (three) times daily as needed for anxiety.  Marland Kitchen amLODipine (NORVASC) 5 MG tablet TAKE 1 TABLET BY MOUTH EVERY DAY  . buprenorphine (SUBUTEX) 8 MG SUBL SL tablet Place 4 mg under the tongue 2 (two) times daily as needed for pain.  . Cholecalciferol (VITAMIN D3) 50 MCG (2000 UT) TABS Take 2,000 Units by mouth daily.  . fluticasone (FLONASE) 50 MCG/ACT nasal spray Place 2 sprays into both nostrils daily.   . Fluticasone-Umeclidin-Vilant (TRELEGY ELLIPTA) 100-62.5-25 MCG/INH AEPB Inhale 1 puff into the lungs daily.  Marland Kitchen gabapentin (NEURONTIN) 400 MG capsule Take 1 capsule (400 mg total) by mouth every 8 (eight) hours as needed.  . glyBURIDE (DIABETA) 5 MG tablet Take 10 mg by mouth 2 (two) times daily with a meal.   . levocetirizine (XYZAL) 5 MG tablet Take 1 tablet (5 mg total) by mouth every evening.  . metFORMIN (GLUCOPHAGE) 1000 MG tablet Take 1,000 mg by mouth 2 (two) times daily with a meal.   . pantoprazole (PROTONIX) 40 MG tablet Take 1 tablet (40 mg total) by mouth 2 (  two) times daily before a meal. (Patient taking differently: Take 40 mg by mouth 2 (two) times daily. )  . polyethylene glycol (MIRALAX / GLYCOLAX) 17 g packet Take 17 g by mouth daily as needed for mild constipation.  . rosuvastatin (CRESTOR) 20 MG tablet TAKE 1 TABLET BY MOUTH EVERY DAY  . valsartan (DIOVAN) 80 MG tablet Take 80 mg by mouth daily.  . vitamin B-12 (CYANOCOBALAMIN) 500 MCG tablet Take 500 mcg by mouth daily.   No facility-administered encounter medications on file as of 05/06/2019.     Objective: Blood pressure 126/77, pulse 78, temperature 98.2 F (36.8 C),  temperature source Oral, height 5\' 5"  (1.651 m), weight 157 lb (71.2 kg). Patient is alert and in no acute distress. He is wearing facial mask. Conjunctiva is pink. Sclera is nonicteric Oropharyngeal mucosa is normal. No neck masses or thyromegaly noted. Cardiac exam with regular rhythm normal S1 and S2. No murmur or gallop noted. Lungs are clear to auscultation. Abdomen is symmetrical soft and nontender with organomegaly or masses. No LE edema or clubbing noted.   Labs/studies Results:  CBC Latest Ref Rng & Units 02/18/2019 01/28/2019 01/27/2019  WBC 4.0 - 10.5 K/uL 5.7 6.1 8.2  Hemoglobin 13.0 - 17.0 g/dL 11.0(L) 9.3(L) 9.1(L)  Hematocrit 39.0 - 52.0 % 33.7(L) 29.0(L) 27.9(L)  Platelets 150.0 - 400.0 K/uL 396.0 219 212    CMP Latest Ref Rng & Units 01/28/2019 01/27/2019 01/26/2019  Glucose 70 - 99 mg/dL 230(H) 231(H) 314(H)  BUN 8 - 23 mg/dL 9 10 14   Creatinine 0.61 - 1.24 mg/dL 0.93 0.88 1.10  Sodium 135 - 145 mmol/L 137 135 134(L)  Potassium 3.5 - 5.1 mmol/L 3.4(L) 3.5 3.9  Chloride 98 - 111 mmol/L 102 99 97(L)  CO2 22 - 32 mmol/L 25 26 24   Calcium 8.9 - 10.3 mg/dL 8.5(L) 8.4(L) 9.0      Assessment:  #1.  History of ulcerative/erosive reflux esophagitis presenting with upper GI bleed and dysphagia in February 2020.  GERD symptoms are well controlled with antireflux measures and double dose PPI.  Patient has longstanding diabetes mellitus.  He does not have any symptoms suggest gastroparesis.  If esophagitis has not healed will consider gastric emptying study.  #2.  Esophageal dysphagia.  Dysphagia actually has become less frequent with therapy for GERD.  He was noted to have Schatzki's ring on his last exam which was then manipulated.  He could have developed esophageal stricture with healing of esophagitis.  He would benefit from EGD with dilation.  #3.  Anemia.  Anemia was felt to be secondary to upper GI bleed.  His hemoglobin has been gradually coming up but still below  normal.  It is unclear as to when his last colonoscopy was.  Plan:  Continue pantoprazole at current dose of 40 mg twice daily.  He takes it 30 minutes before breakfast and evening meal daily. Esophagogastroduodenoscopy with esophageal dilation in the near future. If esophagitis has healed will consider dropping PPI dose. We will try to request prior colonoscopy records. Patient is to remind Dr. Trena Platt office to send a copy of his blood work to be done later this week. Office visit in 1 year.

## 2019-05-06 NOTE — Patient Instructions (Signed)
Esophagogastroduodenoscopy with esophageal dilation to be scheduled. Please asked Dr. Trena Platt office to send me copy of your blood work to be done later this week.

## 2019-05-15 ENCOUNTER — Other Ambulatory Visit: Payer: Self-pay

## 2019-05-16 ENCOUNTER — Encounter: Payer: Self-pay | Admitting: Pulmonary Disease

## 2019-05-16 ENCOUNTER — Other Ambulatory Visit (HOSPITAL_COMMUNITY)
Admission: RE | Admit: 2019-05-16 | Discharge: 2019-05-16 | Disposition: A | Discharge status: Home / Self Care | Payer: Medicare Other | Source: Ambulatory Visit | Attending: Internal Medicine | Admitting: Internal Medicine

## 2019-05-16 ENCOUNTER — Ambulatory Visit (INDEPENDENT_AMBULATORY_CARE_PROVIDER_SITE_OTHER): Payer: Medicare Other | Admitting: Pulmonary Disease

## 2019-05-16 ENCOUNTER — Other Ambulatory Visit: Payer: Self-pay

## 2019-05-16 ENCOUNTER — Telehealth: Payer: Self-pay | Admitting: Pharmacist

## 2019-05-16 VITALS — BP 128/68 | HR 84 | Ht 65.0 in | Wt 164.8 lb

## 2019-05-16 DIAGNOSIS — J441 Chronic obstructive pulmonary disease with (acute) exacerbation: Secondary | ICD-10-CM

## 2019-05-16 DIAGNOSIS — I25118 Atherosclerotic heart disease of native coronary artery with other forms of angina pectoris: Secondary | ICD-10-CM

## 2019-05-16 MED ORDER — AZITHROMYCIN 250 MG PO TABS: ORAL_TABLET | ORAL | 0 refills | Status: DC

## 2019-05-16 MED ORDER — TRELEGY ELLIPTA 100-62.5-25 MCG/INH IN AEPB: 1.0000 | INHALATION_SPRAY | Freq: Every day | RESPIRATORY_TRACT | 0 refills | Status: DC

## 2019-05-16 NOTE — Addendum Note (Signed)
Addended by: Suzzanne Cloud E on: 05/16/2019 09:31 AM   Modules accepted: Orders

## 2019-05-16 NOTE — Addendum Note (Signed)
Addended by: Suzzanne Cloud E on: 05/16/2019 09:32 AM   Modules accepted: Orders

## 2019-05-16 NOTE — Telephone Encounter (Signed)
-----   Message from Marshell Garfinkel, MD sent at 05/16/2019  9:02 AM EST ----- Can you see this patient with COPD, asthma overlapHe will need LABA, LAMA and ICS but cannot afford most medications due to high deductible plan. Will need inhaler training, isp flow check and patient assistance. Thanks

## 2019-05-16 NOTE — Telephone Encounter (Signed)
Please schedule pharmacy clinic visit on the [facility] Pharmacist schedule for inhaler optimization per Mannam. Thanks!

## 2019-05-16 NOTE — Addendum Note (Signed)
Addended by: Elton Sin on: 05/16/2019 01:34 PM   Modules accepted: Orders

## 2019-05-16 NOTE — Patient Instructions (Addendum)
I have reviewed your CT scan which shows some mild inflammation in the upper lobes which could be from infection Give you a Z-Pak.  Check sputum for AFB, fungal antigen culture We will give you samples of Trelegy inhaler   I will check with pharmacy to see if we can get you on some kind of inhaler regimen that is affordable or try for patient assistance Follow-up in 1 to 2 months.

## 2019-05-16 NOTE — Progress Notes (Addendum)
Travis Beck    LJ:[id number]    Dec 01, 1945  Primary Care Physician:Shah, Weldon Picking, MD  Referring Physician: Monico Blitz, [address],  [place] [address]  Chief complaint: Follow up for asthma, emphysema, chronic bronchitis, abnormal CT scan  HPI: 73 year old with history of allergic rhinitis, hyperlipidemia, type 2 diabetes, former smoker Treated for pneumonia in May-June 2019.  He required 3 rounds of antibiotic to finally get symptoms under control.  He had imaging including CT of the chest which showed bilateral infiltrates, nodular opacities and has been referred here for further evaluation.  Follow-up CT shows resolution of infiltrate.  Pets: Has a dog, no cats, birds, farm animals Occupation: Used to work from [employer].  Currently retired Exposures: No known exposures, no leak, hot tub, Jacuzzi, mold Smoking history: 35-pack-year smoker.  Quit in 1994 Travel history: No significant travel.  Interim history: States that he has increasing problems with chronic cough, mucus with dyspnea, wheezing Getting the right inhalers for him has been an issue He is not on controller medication and just using albuterol as needed  He was prescribed Symbicort but did not use it on a regular basis.  Trelegy was too expensive Continues to have significant dyspnea.  Has had multiple rounds of prednisone.  The most recent one was 1 week ago for a skin boil from PCP  Has significant issues with cough which is mostly at night and when he lies down.  He is followed by Dr. Corbin Ade, GI and was told he has esophageal ulcers from acid reflux.  He is on PPI twice daily He is scheduled for esophageal dilatation on the Oct 9th with Covid testing prior to procedure.  Outpatient Encounter Medications as of 05/16/2019  Medication Sig  . albuterol (VENTOLIN HFA) 108 (90 Base) MCG/ACT inhaler Inhale 2 puffs into the lungs every 6 (six) hours as needed for wheezing or shortness of  breath.   . ALPRAZolam (XANAX) 1 MG tablet Take 1 tablet (1 mg total) by mouth 3 (three) times daily as needed for anxiety.  Marland Kitchen amLODipine (NORVASC) 5 MG tablet TAKE 1 TABLET BY MOUTH EVERY DAY  . buprenorphine (SUBUTEX) 8 MG SUBL SL tablet Place 4 mg under the tongue 2 (two) times daily as needed for pain.  . fluticasone (FLONASE) 50 MCG/ACT nasal spray Place 2 sprays into both nostrils daily.   Marland Kitchen gabapentin (NEURONTIN) 400 MG capsule Take 1 capsule (400 mg total) by mouth every 8 (eight) hours as needed.  . glyBURIDE (DIABETA) 5 MG tablet Take 10 mg by mouth 2 (two) times daily with a meal.   . metFORMIN (GLUCOPHAGE) 1000 MG tablet Take 1,000 mg by mouth 2 (two) times daily with a meal.   . pantoprazole (PROTONIX) 40 MG tablet Take 1 tablet (40 mg total) by mouth 2 (two) times daily before a meal. (Patient taking differently: Take 40 mg by mouth 2 (two) times daily. )  . rosuvastatin (CRESTOR) 20 MG tablet TAKE 1 TABLET BY MOUTH EVERY DAY  . valsartan (DIOVAN) 80 MG tablet Take 80 mg by mouth daily.  . Fluticasone-Umeclidin-Vilant (TRELEGY ELLIPTA) 100-62.5-25 MCG/INH AEPB Inhale 1 puff into the lungs daily. (Patient not taking: Reported on 05/16/2019)  . [DISCONTINUED] Cholecalciferol (VITAMIN D3) 50 MCG (2000 UT) TABS Take 2,000 Units by mouth daily.  . [DISCONTINUED] levocetirizine (XYZAL) 5 MG tablet Take 1 tablet (5 mg total) by mouth every evening.  . [DISCONTINUED] polyethylene glycol (MIRALAX / GLYCOLAX)  17 g packet Take 17 g by mouth daily as needed for mild constipation.  . [DISCONTINUED] vitamin B-12 (CYANOCOBALAMIN) 500 MCG tablet Take 500 mcg by mouth daily.   No facility-administered encounter medications on file as of 05/16/2019.    Physical Exam: Blood pressure 128/68, pulse 84, height 5\' 5"  (1.651 m), weight 164 lb 12.8 oz (74.8 kg), SpO2 92 %. Gen:      No acute distress HEENT:  EOMI, sclera anicteric Neck:     No masses; no thyromegaly Lungs:    Clear to auscultation  bilaterally; normal respiratory effort CV:         Regular rate and rhythm; no murmurs Abd:      + bowel sounds; soft, non-tender; no palpable masses, no distension Ext:    No edema; adequate peripheral perfusion Skin:      Warm and dry; no rash Neuro: alert and oriented x 3 Psych: normal mood and affect  Data Reviewed: Imaging CT chest 12/21/2017- bilateral peribronchovascular nodularity, groundglass opacities left greater than right.  Dominant 1.1 cm left upper lobe nodule.  Mild centrilobular, paraseptal emphysema.  Left main and three-vessel coronary atherosclerosis.  Small hiatal hernia.    CT chest 04/25/2018- improvement in left upper lobe patchy airspace opacities, mild tree-in-bud in the upper lobes and lower lobes.  Peripheral subcentimeter pulmonary nodules.,  Aortic atherosclerosis  CT scan 04/22/2019-scattered subcentimeter nodules, tree-in-bud opacities.  Multiple patchy areas of groundglass in the upper lobe.  I have reviewed the images personally.  PFTs 04/17/2018 FVC 3.20 [92%), FEV1 2.45 [98%), F/F 77, TLC 125%, RV/DL 361%, DLCO 69% No obstruction, air trapping Mild reduction diffusion capacity.  Labs: CBC 02/18/2019-WBC 5.7, eos 25.2%, absolute eosinophil count 1436 Respiratory allergy profile-IgE 117, RAST panel- Negative Alpha-1 antitrypsin 02/18/2019-172, PI MM   Assessment:  Asthma, emphysema, chronic bronchitis with exacerbation PFTs did not show any significant obstruction but he does have emphysema and air trapping. Unable to afford controller medications due to his high deductible plan  We will give him samples of Trelegy inhaler.  We will try to get him on some kind of patient assistance Refer to pharmacy for help with patient assistance, inhaler training  Abnormal CT CT reviewed with waxing and waning pulmonary infiltrates with groundglass opacities.  He does not have any significant exposure history Give Z-Pak today for symptoms of bronchitis.  Check  sputum for AFB, fungal and regular cultures  Labs noted for elevated peripheral eosinophils.  He may have an eosinophilic driven process such as eosinophilic pneumonia.  He is just coming off prednisone He may need a bronchoscope in the near future. Consider biologic therapy but with his insurance status he will likely not be able to afford.  Chronic rhinitis, allergies Follows with Dr. Ernst Bowler  GERD, esophageal ulcers Suspect significant contribution from GERD to ongoing cough Follows with GI and is on PPI therapy.  We will discuss with Dr. Melony Overly if this could be eosinophilic esophagitis  Health maintenance States that he is up-to-date with Pneumovax ad flu at his primary care  Plan/Recommendations: - Trelegy inhaler samples, albuterol as needed - Z pack.  Sputum culture - Pharmacy consult  Marshell Garfinkel MD [facility] 05/16/2019, 8:43 AM  CC: Monico Blitz, MD

## 2019-05-17 NOTE — OR Nursing (Signed)
Called and spoke with patient who is scheduled for an EGD/ED on Monday, Novermber 9, 2020. Patient states he forgot to go for Covid testing. Informed patient he wouild have to be rescheduled on another day.Dr. Laural Golden notified.

## 2019-05-19 ENCOUNTER — Other Ambulatory Visit: Payer: Self-pay

## 2019-05-19 ENCOUNTER — Other Ambulatory Visit (HOSPITAL_COMMUNITY)
Admission: RE | Admit: 2019-05-19 | Discharge: 2019-05-19 | Disposition: A | Discharge status: Home / Self Care | Payer: Medicare Other | Source: Ambulatory Visit | Attending: Internal Medicine | Admitting: Internal Medicine

## 2019-05-19 DIAGNOSIS — Z20828 Contact with and (suspected) exposure to other viral communicable diseases: Secondary | ICD-10-CM | POA: Diagnosis not present

## 2019-05-19 DIAGNOSIS — Z01812 Encounter for preprocedural laboratory examination: Secondary | ICD-10-CM | POA: Insufficient documentation

## 2019-05-19 LAB — SARS CORONAVIRUS 2 (TAT 6-24 HRS): SARS Coronavirus 2: NEGATIVE

## 2019-05-19 NOTE — Telephone Encounter (Signed)
Called and spoke to pt sister -pt is having his esophagus stretched and they would like to hold off on scheduling at this time -pr

## 2019-05-21 ENCOUNTER — Ambulatory Visit (HOSPITAL_COMMUNITY)
Admission: RE | Admit: 2019-05-21 | Discharge: 2019-05-21 | Disposition: A | Discharge status: Home / Self Care | Payer: Medicare Other | Attending: Internal Medicine | Admitting: Internal Medicine

## 2019-05-21 ENCOUNTER — Encounter (HOSPITAL_COMMUNITY): Payer: Self-pay | Admitting: *Deleted

## 2019-05-21 ENCOUNTER — Ambulatory Visit: Payer: Medicare Other | Admitting: Pulmonary Disease

## 2019-05-21 ENCOUNTER — Other Ambulatory Visit: Payer: Self-pay

## 2019-05-21 ENCOUNTER — Encounter (HOSPITAL_COMMUNITY)
Admission: RE | Disposition: A | Discharge status: Home / Self Care | Payer: Self-pay | Source: Home / Self Care | Attending: Internal Medicine

## 2019-05-21 DIAGNOSIS — E119 Type 2 diabetes mellitus without complications: Secondary | ICD-10-CM | POA: Diagnosis not present

## 2019-05-21 DIAGNOSIS — K228 Other specified diseases of esophagus: Secondary | ICD-10-CM | POA: Insufficient documentation

## 2019-05-21 DIAGNOSIS — K2210 Ulcer of esophagus without bleeding: Secondary | ICD-10-CM | POA: Diagnosis not present

## 2019-05-21 DIAGNOSIS — R1314 Dysphagia, pharyngoesophageal phase: Secondary | ICD-10-CM | POA: Insufficient documentation

## 2019-05-21 DIAGNOSIS — Z79899 Other long term (current) drug therapy: Secondary | ICD-10-CM | POA: Insufficient documentation

## 2019-05-21 DIAGNOSIS — Z7951 Long term (current) use of inhaled steroids: Secondary | ICD-10-CM | POA: Diagnosis not present

## 2019-05-21 DIAGNOSIS — Z880 Allergy status to penicillin: Secondary | ICD-10-CM | POA: Diagnosis not present

## 2019-05-21 DIAGNOSIS — K222 Esophageal obstruction: Secondary | ICD-10-CM | POA: Insufficient documentation

## 2019-05-21 DIAGNOSIS — I10 Essential (primary) hypertension: Secondary | ICD-10-CM | POA: Diagnosis not present

## 2019-05-21 DIAGNOSIS — R1310 Dysphagia, unspecified: Secondary | ICD-10-CM

## 2019-05-21 DIAGNOSIS — M1990 Unspecified osteoarthritis, unspecified site: Secondary | ICD-10-CM | POA: Diagnosis not present

## 2019-05-21 DIAGNOSIS — J45909 Unspecified asthma, uncomplicated: Secondary | ICD-10-CM | POA: Diagnosis not present

## 2019-05-21 DIAGNOSIS — K449 Diaphragmatic hernia without obstruction or gangrene: Secondary | ICD-10-CM | POA: Diagnosis not present

## 2019-05-21 DIAGNOSIS — Z87891 Personal history of nicotine dependence: Secondary | ICD-10-CM | POA: Insufficient documentation

## 2019-05-21 DIAGNOSIS — Z7984 Long term (current) use of oral hypoglycemic drugs: Secondary | ICD-10-CM | POA: Insufficient documentation

## 2019-05-21 DIAGNOSIS — J439 Emphysema, unspecified: Secondary | ICD-10-CM | POA: Diagnosis not present

## 2019-05-21 HISTORY — PX: ESOPHAGEAL DILATION: SHX303

## 2019-05-21 HISTORY — PX: ESOPHAGOGASTRODUODENOSCOPY: SHX5428

## 2019-05-21 LAB — GLUCOSE, CAPILLARY: Glucose-Capillary: 113 mg/dL — ABNORMAL HIGH (ref 70–99)

## 2019-05-21 SURGERY — EGD (ESOPHAGOGASTRODUODENOSCOPY)
Anesthesia: Moderate Sedation

## 2019-05-21 MED ORDER — SODIUM CHLORIDE 0.9 % IV SOLN
INTRAVENOUS | Status: DC
  Administered 2019-05-21: 1000 mL via INTRAVENOUS

## 2019-05-21 MED ORDER — LIDOCAINE VISCOUS HCL 2 % MT SOLN
OROMUCOSAL | Status: DC | PRN
  Administered 2019-05-21: 4 mL via OROMUCOSAL

## 2019-05-21 MED ORDER — MEPERIDINE HCL 50 MG/ML IJ SOLN
INTRAMUSCULAR | Status: AC
  Filled 2019-05-21: qty 1

## 2019-05-21 MED ORDER — STERILE WATER FOR IRRIGATION IR SOLN
Status: DC | PRN
  Administered 2019-05-21: 12:00:00 1.5 mL

## 2019-05-21 MED ORDER — MIDAZOLAM HCL 5 MG/5ML IJ SOLN
INTRAMUSCULAR | Status: DC | PRN
  Administered 2019-05-21: 1 mg via INTRAVENOUS
  Administered 2019-05-21: 2 mg via INTRAVENOUS
  Administered 2019-05-21 (×2): 1 mg via INTRAVENOUS

## 2019-05-21 MED ORDER — MEPERIDINE HCL 50 MG/ML IJ SOLN
INTRAMUSCULAR | Status: DC | PRN
  Administered 2019-05-21 (×2): 25 mg via INTRAVENOUS

## 2019-05-21 MED ORDER — MIDAZOLAM HCL 5 MG/5ML IJ SOLN
INTRAMUSCULAR | Status: AC
  Filled 2019-05-21: qty 10

## 2019-05-21 MED ORDER — LIDOCAINE VISCOUS HCL 2 % MT SOLN
OROMUCOSAL | Status: AC
  Filled 2019-05-21: qty 15

## 2019-05-21 MED ORDER — PANTOPRAZOLE SODIUM 40 MG PO TBEC: 40.0000 mg | DELAYED_RELEASE_TABLET | Freq: Every day | ORAL | 3 refills | Status: DC

## 2019-05-21 NOTE — Op Note (Signed)
[facility] Patient Name: Travis Beck Procedure Date: 05/21/2019 11:19 AM MRN: [id number] Date of Birth: July 16, 1945 Attending MD: Hildred Laser , MD CSN: [id number] Age: 73 Admit Type: Outpatient Procedure:                Upper GI endoscopy Indications:              Esophageal dysphagia Providers:                Hildred Laser, MD, Jeanann Lewandowsky. Sharon Seller, RN, Aram Candela Referring MD:             Fuller Canada Manuella Ghazi, MD Medicines:                Lidocaine spray, Meperidine 50 mg IV, Midazolam 5                            mg IV Complications:            No immediate complications. Estimated Blood Loss:     Estimated blood loss was minimal. Procedure:                Pre-Anesthesia Assessment:                           - Prior to the procedure, a History and Physical                            was performed, and patient medications and                            allergies were reviewed. The patient's tolerance of                            previous anesthesia was also reviewed. The risks                            and benefits of the procedure and the sedation                            options and risks were discussed with the patient.                            All questions were answered, and informed consent                            was obtained. Prior Anticoagulants: The patient has                            taken no previous anticoagulant or antiplatelet                            agents. ASA Grade Assessment: III - A patient with  severe systemic disease. After reviewing the risks                            and benefits, the patient was deemed in                            satisfactory condition to undergo the procedure.                           After obtaining informed consent, the endoscope was                            passed under direct vision. Throughout the                            procedure, the patient's blood  pressure, pulse, and                            oxygen saturations were monitored continuously. The                            [id number]-[id number] ID:[id number]) scope was introduced through the                            mouth, and advanced to the second part of duodenum.                            The upper GI endoscopy was accomplished without                            difficulty. The patient tolerated the procedure                            well. Scope In: 11:42:55 AM Scope Out: 11:50:29 AM Total Procedure Duration: 0 hours 7 minutes 34 seconds  Findings:      A healed ulcer was found in the distal esophagus.      One benign-appearing, intrinsic mild stenosis was found 34 cm from the       incisors. The stenosis was traversed. The scope was withdrawn. Dilation       was performed with a Maloney dilator with no resistance at 57 Fr. The       dilation site was examined following endoscope reinsertion and showed       mild mucosal disruption, moderate improvement in luminal narrowing and       no perforation.      A non-obstructing Schatzki ring was found at the gastroesophageal       junction(36 cm from incisors)      A 3 cm hiatal hernia was present.      The entire examined stomach was normal.      The duodenal bulb and second portion of the duodenum were normal. Impression:               - Scar in the distal esophagus. Healed esophagitis.                           -  Benign-appearing esophageal stenosis proximal to                            GEJ. Dilated.                           - Non-obstructing Schatzki ring.                           - 3 cm hiatal hernia.                           - Normal stomach.                           - Normal duodenal bulb and second portion of the                            duodenum.                           - No specimens collected. Moderate Sedation:      Moderate (conscious) sedation was administered by the endoscopy nurse       and supervised by the  endoscopist. The following parameters were       monitored: oxygen saturation, heart rate, blood pressure, CO2       capnography and response to care. Total physician intraservice time was       13 minutes. Recommendation:           - Patient has a contact number available for                            emergencies. The signs and symptoms of potential                            delayed complications were discussed with the                            patient. Return to normal activities tomorrow.                            Written discharge instructions were provided to the                            patient.                           - Resume previous diet today.                           - Continue present medications ecept decrease                            Pantoprazole to 40 mg po qam.                           - No aspirin, ibuprofen, naproxen, or other  non-steroidal anti-inflammatory drugs for 3 days.                           - Return to GI clinic in 1 year. Procedure Code(s):        --- Professional ---                           [id number], Esophagogastroduodenoscopy, flexible,                            transoral; diagnostic, including collection of                            specimen(s) by brushing or washing, when performed                            (separate procedure)                           43450, Dilation of esophagus, by unguided sound or                            bougie, single or multiple passes                           G0500, Moderate sedation services provided by the                            same physician or other qualified health care                            professional performing a gastrointestinal                            endoscopic service that sedation supports,                            requiring the presence of an independent trained                            observer to assist in the monitoring of the                             patient's level of consciousness and physiological                            status; initial 15 minutes of intra-service time;                            patient age 28 years or older (additional time may                            be reported with [phone], as appropriate) Diagnosis Code(s):        --- Professional ---  K22.8, Other specified diseases of esophagus                           K22.2, Esophageal obstruction                           K44.9, Diaphragmatic hernia without obstruction or                            gangrene                           R13.14, Dysphagia, pharyngoesophageal phase CPT copyright 2019 American Medical Association. All rights reserved. The codes documented in this report are preliminary and upon coder review may  be revised to meet current compliance requirements. Hildred Laser, MD Hildred Laser, MD 05/21/2019 12:02:59 PM This report has been signed electronically. Number of Addenda: 0

## 2019-05-21 NOTE — H&P (Signed)
Travis Beck is an 73 y.o. male.   Chief Complaint: Patient is here for esophagogastroduodenoscopy and esophageal dilation. HPI: Patient is 73 year old Caucasian male who presented in February this year with upper GI bleed and dysphagia.  He underwent EGD on 08/30/2018 which revealed grade C reflux esophagitis Schatzki's ring which was not manipulated because of esophagitis.  He also has small sliding hiatal hernia.  He has been maintained on PPI.  He is watching his diet and chooses food thoroughly and reports no problems.  He is undergoing EGD with therapeutic intention.  Past Medical History:  Diagnosis Date  . Arthritis   . Asthma    Childhood  . Diabetes ([place])   . Hypertension         Emphysema.  Past Surgical History:  Procedure Laterality Date  . BIOPSY  08/30/2018   Procedure: BIOPSY;  Surgeon: Rogene Houston, MD;  Location: [facility];  Service: Endoscopy;;  gastric   . COLONOSCOPY    . ESOPHAGOGASTRODUODENOSCOPY (EGD) WITH PROPOFOL N/A 08/30/2018   Procedure: ESOPHAGOGASTRODUODENOSCOPY (EGD) WITH PROPOFOL;  Surgeon: Rogene Houston, MD;  Location: [facility];  Service: Endoscopy;  Laterality: N/A;  1:30  . HERNIA REPAIR     bilateral lower abdomen    History reviewed. No pertinent family history. Social History:  reports that he quit smoking about 26 years ago. His smoking use included cigarettes. He has a 66.00 pack-year smoking history. He has never used smokeless tobacco. He reports previous alcohol use. He reports that he does not use drugs.  Allergies:  Allergies  Allergen Reactions  . Penicillins Rash    No problems with ampicillin during hospitalization    Medications Prior to Admission  Medication Sig Dispense Refill  . albuterol (VENTOLIN HFA) 108 (90 Base) MCG/ACT inhaler Inhale 2 puffs into the lungs every 6 (six) hours as needed for wheezing or shortness of breath.     . ALPRAZolam (XANAX) 1 MG tablet Take 1 tablet (1 mg total) by mouth 3  (three) times daily as needed for anxiety. 30 tablet 0  . amLODipine (NORVASC) 5 MG tablet TAKE 1 TABLET BY MOUTH EVERY DAY 30 tablet 6  . buprenorphine (SUBUTEX) 8 MG SUBL SL tablet Place 4 mg under the tongue 2 (two) times daily as needed for pain.    Marland Kitchen Fluticasone-Umeclidin-Vilant (TRELEGY ELLIPTA) 100-62.5-25 MCG/INH AEPB Inhale 1 puff into the lungs daily. 60 each 1  . gabapentin (NEURONTIN) 400 MG capsule Take 1 capsule (400 mg total) by mouth every 8 (eight) hours as needed.  1  . glyBURIDE (DIABETA) 5 MG tablet Take 10 mg by mouth 2 (two) times daily with a meal.     . metFORMIN (GLUCOPHAGE) 1000 MG tablet Take 1,000 mg by mouth 2 (two) times daily with a meal.     . pantoprazole (PROTONIX) 40 MG tablet Take 1 tablet (40 mg total) by mouth 2 (two) times daily before a meal. (Patient taking differently: Take 40 mg by mouth 2 (two) times daily. ) 180 tablet 3  . rosuvastatin (CRESTOR) 20 MG tablet TAKE 1 TABLET BY MOUTH EVERY DAY 90 tablet 1  . valsartan (DIOVAN) 80 MG tablet Take 80 mg by mouth daily.    Marland Kitchen azithromycin (ZITHROMAX) 250 MG tablet Take as directed 6 tablet 0  . fluticasone (FLONASE) 50 MCG/ACT nasal spray Place 2 sprays into both nostrils daily.     . Fluticasone-Umeclidin-Vilant (TRELEGY ELLIPTA) 100-62.5-25 MCG/INH AEPB Inhale 1 puff into the lungs daily.  14 each 0    Results for orders placed or performed during the hospital encounter of 05/21/19 (from the past 48 hour(s))  Glucose, capillary     Status: Abnormal   Collection Time: 05/21/19 10:50 AM  Result Value Ref Range   Glucose-Capillary 113 (H) 70 - 99 mg/dL   No results found.  ROS  Blood pressure (!) 146/83, pulse 71, temperature 97.9 F (36.6 C), temperature source Axillary, resp. rate 12, height 5\' 5"  (1.651 m), weight 71.7 kg, SpO2 95 %. Physical Exam  Constitutional: He appears well-developed and well-nourished.  HENT:  Mouth/Throat: Oropharynx is clear and moist.  Eyes: Conjunctivae are normal. No  scleral icterus.  Neck: No thyromegaly present.  Cardiovascular: Normal rate, regular rhythm and normal heart sounds.  No murmur heard. Respiratory:  Breathing effort is normal.  He has scattered expiratory rhonchi bilaterally.  Musculoskeletal:        General: No edema.  Lymphadenopathy:    He has no cervical adenopathy.  Neurological: He is alert.  Skin: Skin is warm and dry.     Assessment/Plan History of esophageal dysphagia and Schatzki's ring. Esophagogastroduodenoscopy with esophageal dilation.  Hildred Laser, MD 05/21/2019, 11:32 AM

## 2019-05-21 NOTE — Discharge Instructions (Signed)
Upper Endoscopy, Adult, Care After This sheet gives you information about how to care for yourself after your procedure. Your health care provider may also give you more specific instructions. If you have problems or questions, contact your health care provider. What can I expect after the procedure? After the procedure, it is common to have:  A sore throat.  Mild stomach pain or discomfort.  Bloating.  Nausea. Follow these instructions at home:   Follow instructions from your health care provider about what to eat or drink after your procedure.  Return to your normal activities as told by your health care provider. Ask your health care provider what activities are safe for you.  Take over-the-counter and prescription medicines only as told by your health care provider.  Do not drive for 24 hours if you were given a sedative during your procedure.  Keep all follow-up visits as told by your health care provider. This is important. Contact a health care provider if you have:  A sore throat that lasts longer than one day.  Trouble swallowing. Get help right away if:  You vomit blood or your vomit looks like coffee grounds.  You have: ? A fever. ? Bloody, black, or tarry stools. ? A severe sore throat or you cannot swallow. ? Difficulty breathing. ? Severe pain in your chest or abdomen. Summary  After the procedure, it is common to have a sore throat, mild stomach discomfort, bloating, and nausea.  Do not drive for 24 hours if you were given a sedative during the procedure.  Follow instructions from your health care provider about what to eat or drink after your procedure.  Return to your normal activities as told by your health care provider. This information is not intended to replace advice given to you by your health care provider. Make sure you discuss any questions you have with your health care provider. Document Released: 12/26/2011 Document Revised: 12/18/2017  Document Reviewed: 11/26/2017 Elsevier Patient Education  [place]. No aspirin or NSAIDs for 3 days. Decrease pantoprazole to once a day.  Take 40 mg by mouth 30 minutes before breakfast daily. Resume other medications as before. Resume usual diet. No driving for 24 hours. Please call office if you experience dysphagia. Office visit in 1 year.

## 2019-05-26 ENCOUNTER — Encounter (HOSPITAL_COMMUNITY): Payer: Self-pay | Admitting: Internal Medicine

## 2019-05-30 DIAGNOSIS — Z6826 Body mass index (BMI) 26.0-26.9, adult: Secondary | ICD-10-CM | POA: Diagnosis not present

## 2019-05-30 DIAGNOSIS — E1165 Type 2 diabetes mellitus with hyperglycemia: Secondary | ICD-10-CM | POA: Diagnosis not present

## 2019-05-30 DIAGNOSIS — R5383 Other fatigue: Secondary | ICD-10-CM | POA: Diagnosis not present

## 2019-05-30 DIAGNOSIS — E119 Type 2 diabetes mellitus without complications: Secondary | ICD-10-CM | POA: Diagnosis not present

## 2019-05-30 DIAGNOSIS — Z79899 Other long term (current) drug therapy: Secondary | ICD-10-CM | POA: Diagnosis not present

## 2019-05-30 DIAGNOSIS — Z1211 Encounter for screening for malignant neoplasm of colon: Secondary | ICD-10-CM | POA: Diagnosis not present

## 2019-05-30 DIAGNOSIS — Z0000 Encounter for general adult medical examination without abnormal findings: Secondary | ICD-10-CM | POA: Diagnosis not present

## 2019-05-30 DIAGNOSIS — E785 Hyperlipidemia, unspecified: Secondary | ICD-10-CM | POA: Diagnosis not present

## 2019-05-30 DIAGNOSIS — Z299 Encounter for prophylactic measures, unspecified: Secondary | ICD-10-CM | POA: Diagnosis not present

## 2019-05-30 DIAGNOSIS — Z1339 Encounter for screening examination for other mental health and behavioral disorders: Secondary | ICD-10-CM | POA: Diagnosis not present

## 2019-05-30 DIAGNOSIS — Z7189 Other specified counseling: Secondary | ICD-10-CM | POA: Diagnosis not present

## 2019-05-30 DIAGNOSIS — Z125 Encounter for screening for malignant neoplasm of prostate: Secondary | ICD-10-CM | POA: Diagnosis not present

## 2019-05-30 DIAGNOSIS — Z1331 Encounter for screening for depression: Secondary | ICD-10-CM | POA: Diagnosis not present

## 2019-07-01 LAB — AFB CULTURE WITH SMEAR (NOT AT ARMC)
Acid Fast Culture: NEGATIVE
Acid Fast Smear: NEGATIVE

## 2019-07-09 ENCOUNTER — Other Ambulatory Visit: Payer: Self-pay | Admitting: Pulmonary Disease

## 2019-07-09 DIAGNOSIS — E119 Type 2 diabetes mellitus without complications: Secondary | ICD-10-CM | POA: Diagnosis not present

## 2019-07-09 DIAGNOSIS — I10 Essential (primary) hypertension: Secondary | ICD-10-CM | POA: Diagnosis not present

## 2019-07-18 ENCOUNTER — Ambulatory Visit: Payer: Medicare Other | Admitting: Pulmonary Disease

## 2019-07-25 ENCOUNTER — Telehealth: Payer: Self-pay | Admitting: Cardiovascular Disease

## 2019-07-25 NOTE — Telephone Encounter (Signed)
Virtual Visit Pre-Appointment Phone Call  "(Name), I am calling you today to discuss your upcoming appointment. We are currently trying to limit exposure to the virus that causes COVID-19 by seeing patients at home rather than in the office."  "What is the BEST phone number to call the day of the visit?" -  [phone]  1. Do you have or have access to (through a family member/friend) a smartphone with video capability that we can use for your visit?" a. If yes - list this number in appt notes as cell (if different from BEST phone #) and list the appointment type as a VIDEO visit in appointment notes b. If no - list the appointment type as a PHONE visit in appointment notes  2. Confirm consent - "In the setting of the current Covid19 crisis, you are scheduled for a (phone or video) visit with your provider on (date) at (time).  Just as we do with many in-office visits, in order for you to participate in this visit, we must obtain consent.  If you'd like, I can send this to your mychart (if signed up) or email for you to review.  Otherwise, I can obtain your verbal consent now.  All virtual visits are billed to your insurance company just like a normal visit would be.  By agreeing to a virtual visit, we'd like you to understand that the technology does not allow for your provider to perform an examination, and thus may limit your provider's ability to fully assess your condition. If your provider identifies any concerns that need to be evaluated in person, we will make arrangements to do so.  Finally, though the technology is pretty good, we cannot assure that it will always work on either your or our end, and in the setting of a video visit, we may have to convert it to a phone-only visit.  In either situation, we cannot ensure that we have a secure connection.  Are you willing to proceed?" STAFF: Did the patient verbally acknowledge consent to telehealth visit? Document YES/NO here: YES    3. Advise patient to be prepared - "Two hours prior to your appointment, go ahead and check your blood pressure, pulse, oxygen saturation, and your weight (if you have the equipment to check those) and write them all down. When your visit starts, your provider will ask you for this information. If you have an Apple Watch or Kardia device, please plan to have heart rate information ready on the day of your appointment. Please have a pen and paper handy nearby the day of the visit as well."  4. Give patient instructions for MyChart download to smartphone OR Doximity/Doxy.me as below if video visit (depending on what platform provider is using)  5. Inform patient they will receive a phone call 15 minutes prior to their appointment time (may be from unknown caller ID) so they should be prepared to answer    [name] has been deemed a candidate for a follow-up tele-health visit to limit community exposure during the Covid-19 pandemic. I spoke with the patient via phone to ensure availability of phone/video source, confirm preferred email & phone number, and discuss instructions and expectations.  I reminded Travis Beck to be prepared with any vital sign and/or heart rhythm information that could potentially be obtained via home monitoring, at the time of his visit. I reminded Travis Beck to expect a phone call prior to his visit.  Travis Beck 07/25/2019 11:00 AM   INSTRUCTIONS FOR DOWNLOADING THE MYCHART APP TO SMARTPHONE  - The patient must first make sure to have activated MyChart and know their login information - If Apple, go to [employer] and type in MyChart in the search bar and download the app. If Android, ask patient to go to [employer] and type in [place] in the search bar and download the app. The app is free but as with any other app downloads, their phone may require them to verify saved payment information or  Apple/Android password.  - The patient will need to then log into the app with their MyChart username and password, and select [facility] as their healthcare provider to link the account. When it is time for your visit, go to the MyChart app, find appointments, and click Begin Video Visit. Be sure to Select Allow for your device to access the Microphone and Camera for your visit. You will then be connected, and your provider will be with you shortly.  **If they have any issues connecting, or need assistance please contact MyChart service desk [phone])**  **If using a computer, in order to ensure the best quality for their visit they will need to use either of the following Internet Browsers: [employer], or Google Chrome**  IF USING DOXIMITY or DOXY.ME - The patient will receive a link just prior to their visit by text.     FULL LENGTH CONSENT FOR TELE-HEALTH VISIT   I hereby voluntarily request, consent and authorize [facility] and its employed or contracted physicians, physician assistants, nurse practitioners or other licensed health care professionals (the Practitioner), to provide me with telemedicine health care services (the Services") as deemed necessary by the treating Practitioner. I acknowledge and consent to receive the Services by the Practitioner via telemedicine. I understand that the telemedicine visit will involve communicating with the Practitioner through live audiovisual communication technology and the disclosure of certain medical information by electronic transmission. I acknowledge that I have been given the opportunity to request an in-person assessment or other available alternative prior to the telemedicine visit and am voluntarily participating in the telemedicine visit.  I understand that I have the right to withhold or withdraw my consent to the use of telemedicine in the course of my care at any time, without affecting my right to future care  or treatment, and that the Practitioner or I may terminate the telemedicine visit at any time. I understand that I have the right to inspect all information obtained and/or recorded in the course of the telemedicine visit and may receive copies of available information for a reasonable fee.  I understand that some of the potential risks of receiving the Services via telemedicine include:   Delay or interruption in medical evaluation due to technological equipment failure or disruption;  Information transmitted may not be sufficient (e.g. poor resolution of images) to allow for appropriate medical decision making by the Practitioner; and/or   In rare instances, security protocols could fail, causing a breach of personal health information.  Furthermore, I acknowledge that it is my responsibility to provide information about my medical history, conditions and care that is complete and accurate to the best of my ability. I acknowledge that Practitioner's advice, recommendations, and/or decision may be based on factors not within their control, such as incomplete or inaccurate data provided by me or distortions of diagnostic images or specimens that may result from electronic transmissions. I understand that the  practice of medicine is not an Chief Strategy Officer and that Practitioner makes no warranties or guarantees regarding treatment outcomes. I acknowledge that I will receive a copy of this consent concurrently upon execution via email to the email address I last provided but may also request a printed copy by calling the office of [facility].    I understand that my insurance will be billed for this visit.   I have read or had this consent read to me.  I understand the contents of this consent, which adequately explains the benefits and risks of the Services being provided via telemedicine.   I have been provided ample opportunity to ask questions regarding this consent and the Services and have had  my questions answered to my satisfaction.  I give my informed consent for the services to be provided through the use of telemedicine in my medical care  By participating in this telemedicine visit I agree to the above.

## 2019-08-06 ENCOUNTER — Telehealth (INDEPENDENT_AMBULATORY_CARE_PROVIDER_SITE_OTHER): Payer: Medicare Other | Admitting: Cardiovascular Disease

## 2019-08-06 ENCOUNTER — Encounter: Payer: Self-pay | Admitting: Cardiovascular Disease

## 2019-08-06 VITALS — Ht 65.0 in | Wt 160.0 lb

## 2019-08-06 DIAGNOSIS — I10 Essential (primary) hypertension: Secondary | ICD-10-CM | POA: Diagnosis not present

## 2019-08-06 DIAGNOSIS — D649 Anemia, unspecified: Secondary | ICD-10-CM

## 2019-08-06 DIAGNOSIS — I25118 Atherosclerotic heart disease of native coronary artery with other forms of angina pectoris: Secondary | ICD-10-CM

## 2019-08-06 DIAGNOSIS — E785 Hyperlipidemia, unspecified: Secondary | ICD-10-CM

## 2019-08-06 DIAGNOSIS — E119 Type 2 diabetes mellitus without complications: Secondary | ICD-10-CM

## 2019-08-06 DIAGNOSIS — R9439 Abnormal result of other cardiovascular function study: Secondary | ICD-10-CM

## 2019-08-06 NOTE — Patient Instructions (Addendum)

## 2019-08-06 NOTE — Progress Notes (Signed)
Virtual Visit via Telephone Note   This visit type was conducted due to national recommendations for restrictions regarding the COVID-19 Pandemic (e.g. social distancing) in an effort to limit this patient's exposure and mitigate transmission in our community.  Due to his co-morbid illnesses, this patient is at least at moderate risk for complications without adequate follow up.  This format is felt to be most appropriate for this patient at this time.  The patient did not have access to video technology/had technical difficulties with video requiring transitioning to audio format only (telephone).  All issues noted in this document were discussed and addressed.  No physical exam could be performed with this format.  Please refer to the patient's chart for his  consent to telehealth for [facility].   Date:  08/06/2019   ID:  Travis Beck, DOB 25-Oct-1945, MRN [id number]  Patient Location: Home Provider Location: Office  PCP:  Monico Blitz, MD  Cardiologist:  Kate Sable, MD  Electrophysiologist:  None   Evaluation Performed:  Follow-Up Visit  Chief Complaint:  CAD  History of Present Illness:    Travis Beck is a 74 y.o. male with coronary artery disease, hyperlipidemia, type 2 diabetes mellitus, emphysema and a prior history of tobacco use.  Nuclear stress test on 09/13/2018 was suggestive of a prior MI.  Typically I would have started aspirin but due to him being significantly anemic I have voided this.  I reduced amlodipine to 5 mg daily and started Lopressor 25 mg twice daily.  I also switched pravastatin to rosuvastatin 20 mg daily.  He denies chest pain and palpitations. Chronic shortness of breath from emphysema is stable. Overall he has been doing well.  He tries to walk for exercise.   Past Medical History:  Diagnosis Date  . Arthritis   . Asthma    Childhood  . Diabetes ([place])   . Hypertension    Past Surgical History:  Procedure Laterality  Date  . BIOPSY  08/30/2018   Procedure: BIOPSY;  Surgeon: Rogene Houston, MD;  [facility]: [facility];  Service: Endoscopy;;  gastric   . COLONOSCOPY    . ESOPHAGEAL DILATION N/A 05/21/2019   Procedure: ESOPHAGEAL DILATION;  Surgeon: Rogene Houston, MD;  Location: [facility];  Service: Endoscopy;  Laterality: N/A;  . ESOPHAGOGASTRODUODENOSCOPY N/A 05/21/2019   Procedure: ESOPHAGOGASTRODUODENOSCOPY (EGD);  Surgeon: Rogene Houston, MD;  Location: [facility];  Service: Endoscopy;  Laterality: N/A;  730  . ESOPHAGOGASTRODUODENOSCOPY (EGD) WITH PROPOFOL N/A 08/30/2018   Procedure: ESOPHAGOGASTRODUODENOSCOPY (EGD) WITH PROPOFOL;  Surgeon: Rogene Houston, MD;  [facility]: [facility];  Service: Endoscopy;  Laterality: N/A;  1:30  . HERNIA REPAIR     bilateral lower abdomen     Current Meds  Medication Sig  . ALPRAZolam (XANAX) 1 MG tablet Take 1 tablet (1 mg total) by mouth 3 (three) times daily as needed for anxiety.  Marland Kitchen amLODipine (NORVASC) 5 MG tablet TAKE 1 TABLET BY MOUTH EVERY DAY  . buprenorphine (SUBUTEX) 8 MG SUBL SL tablet Place 16 mg under the tongue daily.   . fluticasone (FLONASE) 50 MCG/ACT nasal spray Place 2 sprays into both nostrils daily.   . Fluticasone-Umeclidin-Vilant (TRELEGY ELLIPTA) 100-62.5-25 MCG/INH AEPB Inhale 1 puff into the lungs daily.  Marland Kitchen gabapentin (NEURONTIN) 400 MG capsule Take 1 capsule (400 mg total) by mouth every 8 (eight) hours as needed.  . glyBURIDE (DIABETA) 5 MG tablet Take 10 mg by mouth 2 (two)  times daily with a meal.   . levocetirizine (XYZAL) 5 MG tablet Take 1 tablet by mouth daily.  . metFORMIN (GLUCOPHAGE) 1000 MG tablet Take 1,000 mg by mouth 2 (two) times daily with a meal.   . pantoprazole (PROTONIX) 40 MG tablet Take 1 tablet (40 mg total) by mouth daily before breakfast.  . rosuvastatin (CRESTOR) 20 MG tablet TAKE 1 TABLET BY MOUTH EVERY DAY  . valsartan (DIOVAN) 80 MG tablet Take 80 mg by mouth daily.     Allergies:    Penicillins   Social History   Tobacco Use  . Smoking status: Former Smoker    Packs/day: 2.00    Years: 33.00    Pack years: 66.00    Types: Cigarettes    Quit date: 01/22/1993    Years since quitting: 26.5  . Smokeless tobacco: Never Used  Substance Use Topics  . Alcohol use: Not Currently  . Drug use: Never     Family Hx: The patient's family history is not on file.  ROS:   Please see the history of present illness.     All other systems reviewed and are negative.   Prior CV studies:   The following studies were reviewed today:  Nuclear stress test 09/13/2018:   Blood pressure demonstrated a normal response to exercise.  There was no ST segment deviation noted during stress.  Findings consistent with prior moderate inferior myocardial infarction with mild peri-infarct ischemia.  The left ventricular ejection fraction is normal (55-65%).  This is a low risk study.  Labs/Other Tests and Data Reviewed:    EKG:  No ECG reviewed.  Recent Labs: 01/28/2019: BUN 9; Creatinine, Ser 0.93; Potassium 3.4; Sodium 137 02/18/2019: Hemoglobin 11.0; Platelets 396.0   Recent Lipid Panel No results found for: CHOL, TRIG, HDL, CHOLHDL, LDLCALC, LDLDIRECT  Wt Readings from Last 3 Encounters:  08/06/19 160 lb (72.6 kg)  05/21/19 158 lb (71.7 kg)  05/16/19 164 lb 12.8 oz (74.8 kg)     Objective:    Vital Signs:  Ht 5\' 5"  (1.651 m)   Wt 160 lb (72.6 kg)   BMI 26.63 kg/m    VITAL SIGNS:  reviewed  ASSESSMENT & PLAN:    1.  Coronary artery disease: This diagnosis is based on nuclear stress test reviewed above with evidence of prior infarct.  Symptomatically stable.  Continue Lopressor 25 mg twice daily and rosuvastatin 20 mg.  No aspirin given history of significant anemia.  2.  Hypertension: This will need continued monitoring.  No changes to therapy.  3.  Hyperlipidemia: Continue rosuvastatin 20 mg.    LDL 25 on 05/30/2019.  4.  Anemia: Hemoglobin 10.5 on  05/30/2019.  Had been 8.8 on 08/30/2018.  5. Type 2 diabetes mellitus: Currently on glyburide, metformin, and Januvia. He is also on statin therapy.   COVID-19 Education: The signs and symptoms of COVID-19 were discussed with the patient and how to seek care for testing (follow up with PCP or arrange E-visit).  The importance of social distancing was discussed today.  Time:   Today, I have spent 10 minutes with the patient with telehealth technology discussing the above problems.     Medication Adjustments/Labs and Tests Ordered: Current medicines are reviewed at length with the patient today.  Concerns regarding medicines are outlined above.   Tests Ordered: No orders of the defined types were placed in this encounter.   Medication Changes: No orders of the defined types were placed in this encounter.  Follow Up:  In Person in 6 month(s)  Signed, Kate Sable, MD  08/06/2019 10:19 AM    [place]

## 2019-08-07 ENCOUNTER — Ambulatory Visit: Payer: Medicare Other | Attending: Internal Medicine

## 2019-08-07 ENCOUNTER — Other Ambulatory Visit: Payer: Self-pay

## 2019-08-07 DIAGNOSIS — Z20822 Contact with and (suspected) exposure to covid-19: Secondary | ICD-10-CM | POA: Diagnosis not present

## 2019-08-08 ENCOUNTER — Telehealth: Payer: Self-pay | Admitting: *Deleted

## 2019-08-08 LAB — NOVEL CORONAVIRUS, NAA: SARS-CoV-2, NAA: NOT DETECTED

## 2019-08-08 NOTE — Telephone Encounter (Signed)
He called in requesting his COVID-19 test result.  I let him know his test result was not detected meaning he did not have the virus.   His brother that he lives with has the virus.   He got sick 2 weeks ago and has been on antibiotics.   I let Travis Beck know the protocol is to quarantine for 14 days however according to when he was exposed to his brother it's been a little over 2 weeks now so his quarantine period is done.    Travis Beck does not have any symptoms.  I let him know if he starts getting sick to go be retested especially since he has COPD.   He verbalized understanding and thanked me for my help.

## 2019-08-22 ENCOUNTER — Telehealth: Payer: Self-pay | Admitting: Pharmacist

## 2019-08-22 DIAGNOSIS — E785 Hyperlipidemia, unspecified: Secondary | ICD-10-CM | POA: Diagnosis not present

## 2019-08-22 DIAGNOSIS — J449 Chronic obstructive pulmonary disease, unspecified: Secondary | ICD-10-CM | POA: Diagnosis not present

## 2019-08-22 DIAGNOSIS — Z6826 Body mass index (BMI) 26.0-26.9, adult: Secondary | ICD-10-CM | POA: Diagnosis not present

## 2019-08-22 DIAGNOSIS — Z20822 Contact with and (suspected) exposure to covid-19: Secondary | ICD-10-CM | POA: Diagnosis not present

## 2019-08-22 DIAGNOSIS — Z87891 Personal history of nicotine dependence: Secondary | ICD-10-CM | POA: Diagnosis not present

## 2019-08-22 DIAGNOSIS — K0889 Other specified disorders of teeth and supporting structures: Secondary | ICD-10-CM | POA: Diagnosis not present

## 2019-08-22 DIAGNOSIS — Z299 Encounter for prophylactic measures, unspecified: Secondary | ICD-10-CM | POA: Diagnosis not present

## 2019-08-22 DIAGNOSIS — I10 Essential (primary) hypertension: Secondary | ICD-10-CM | POA: Diagnosis not present

## 2019-08-22 NOTE — Telephone Encounter (Signed)
Called patient on 08/22/2019 at 2:29 PM   Dr. Vaughan Browner previously referred patient to pharmacy team in 05/2019 for inhaler optimization/financial assistance. Patient stated at that time he would prefer to reschedule the appt for a later date due to an esophagus procedure. Followed up with patient today to re-assess if patient is interested in rescheduling pharmacy appt. Patient verbalized understanding and stated he would like to re-schedule appt. Patient states he will have his sister call to make appt since his sister drives him to appts.  Drexel Iha, PharmD PGY2 Ambulatory Care Pharmacy Resident

## 2019-08-25 NOTE — Progress Notes (Deleted)
Subjective:  Patient presents today to [facility] to see pharmacy team for ***.  Past medical history includes ***  Respiratory Medications Current: *** Tried in past: *** Patient reports {Adherence challenges yes no:[id number]::"adherence challenges","no known adherence challenges"}  Respiratory Symptomatology  Triggers: *** SOB: *** Chest pain: *** Coughing at night: ***days/week Coughing with exertion: *** Frequency of rescue inhaler use: ***  ER visits in last year: ***  Hospitalizations in last year: ***  Objective: Allergies  Allergen Reactions  . Penicillins Rash    No problems with ampicillin during hospitalization    Outpatient Encounter Medications as of 09/05/2019  Medication Sig Note  . ALPRAZolam (XANAX) 1 MG tablet Take 1 tablet (1 mg total) by mouth 3 (three) times daily as needed for anxiety.   Marland Kitchen amLODipine (NORVASC) 5 MG tablet TAKE 1 TABLET BY MOUTH EVERY DAY   . buprenorphine (SUBUTEX) 8 MG SUBL SL tablet Place 16 mg under the tongue daily.  01/25/2019: LF @ [facility] on 01-04-19 # 20 DS 20  . fluticasone (FLONASE) 50 MCG/ACT nasal spray Place 2 sprays into both nostrils daily.    . Fluticasone-Umeclidin-Vilant (TRELEGY ELLIPTA) 100-62.5-25 MCG/INH AEPB Inhale 1 puff into the lungs daily.   Marland Kitchen gabapentin (NEURONTIN) 400 MG capsule Take 1 capsule (400 mg total) by mouth every 8 (eight) hours as needed.   . glyBURIDE (DIABETA) 5 MG tablet Take 10 mg by mouth 2 (two) times daily with a meal.    . levocetirizine (XYZAL) 5 MG tablet Take 1 tablet by mouth daily.   . metFORMIN (GLUCOPHAGE) 1000 MG tablet Take 1,000 mg by mouth 2 (two) times daily with a meal.    . pantoprazole (PROTONIX) 40 MG tablet Take 1 tablet (40 mg total) by mouth daily before breakfast.   . rosuvastatin (CRESTOR) 20 MG tablet TAKE 1 TABLET BY MOUTH EVERY DAY   . valsartan (DIOVAN) 80 MG tablet Take 80 mg by mouth daily.    No facility-administered encounter medications on file as of  09/05/2019.     Immunization History  Administered Date(s) Administered  . Influenza, High Dose Seasonal PF 04/09/2017, 04/22/2018, 05/02/2019  . Tdap 02/04/2018     PFTs PFT Results Latest Ref Rng & Units 04/17/2018  FVC-Pre L 3.02  FVC-Predicted Pre % 87  FVC-Post L 3.20  FVC-Predicted Post % 92  Pre FEV1/FVC % % 82  Post FEV1/FCV % % 77  FEV1-Pre L 2.49  FEV1-Predicted Pre % 100  FEV1-Post L 2.45  DLCO UNC% % 69  DLCO COR %Predicted % 86  TLC L 7.59  TLC % Predicted % 125  RV % Predicted % 199     Chest X-ray  Eosinophils Most recent blood eosinophil count was *** cells/microL taken on ***.   PIFR  Inspiratory flow measured using the In-check DIAL G16 and was in range of 30-90 for use of Medium DPI device (Ellipta). Patient scored ***.  Inspiratory flow measured using the In-check DIAL G16 and was in range of 30-90 for use of Low DPI device (HFA). Patient scored ***.  Inspiratory flow measured using the In-check DIAL G16 and was in range of 20-60 for use of MDI device (Respimat). Patient scored ***.  Assessment and Plan  1. Inhaler Optimization a. PIFR i. Inspiratory flow measured using the In-check DIAL G16 and was in range of 30-90 for use of Medium DPI device (Ellipta). Patient scored ***. ii. Inspiratory flow measured using the In-check DIAL G16 and was in range of  30-90 for use of Low DPI device (HFA). Patient scored ***. iii. Inspiratory flow measured using the In-check DIAL G16 and was in range of 20-60 for use of MDI device (Respimat). Patient scored ***. b. Optimal inhaler for patient would be *** considering ***. i. Patient was counseled on the purpose, proper use, and adverse effects of *** inhaler.  Instructed patient to rinse mouth with water after using in order to prevent fungal infection.  Patient verbalized understanding. ii. Reviewed appropriate use of maintenance vs rescue inhalers.  Stressed importance of using maintenance inhaler daily and rescue  inhaler only as needed.  Patient verbalized understanding. iii. Demonstrated proper inhaler technique using *** demo inhaler.  Patient able to demonstrate proper inhaler technique using teach back method. Patient was given sample in office today.  His inhaler was primed and able to administer first dose in office without issue.  2. Medication Reconciliation a. A drug regimen assessment was performed, including review of allergies, interactions, disease-state management, dosing and immunization history. Medications were reviewed with the patient, including name, instructions, indication, goals of therapy, potential side effects, importance of adherence, and safe use. b. Drug interaction(s): ***  3. Immunizations a. Patient is indicated for the influenzae, pneumonia, and shingles vaccinations.  This appointment required *** minutes of patient care (this includes precharting, chart review, review of results, face-to-face care, etc.).

## 2019-09-01 NOTE — Progress Notes (Signed)
Subjective:  Patient presents today to [facility] to see pharmacy team for inhaler optimization. Patient was referred and last seen by Dr. Vaughan Beck on 05/16/2019. Past medical history includes asthma, pulmonary emphysema, chronic bronchitis, GERD, esophageal dysphagia, and anemia.  Insurance Drug Formulary Coverage Investigation per Travis Beck, CPhT Trelegy Ellipta- $479.45 Breztri- $476.81  Stiolto Respimat- $441.69 Flovent HFA- $279.64 Arnuity Ellipta- $193.84 QVAR inhaler- non-form  Breo Ellipta- non-form Symbicort- $390.74 Spiriva Respimat- $450.84 Incruse Ellipta- non-form  Patient had a $445 deductible this year, he has $436.30 remaining that is applying to all claims  Patient presents with his sister, Travis Beck, (permission given) for initial appt with pharmacy team. Patient reports that his Trelegy Ellipta sample he was given previously at the office was efficacious for him. However, his deductible is too expensive so he is unable to pay for his inhaler. He lives with his brother  (caregiver for brother who is blind). Two person household. He makes about ~$16,800 annually.  Travis Beck takes care of his medications and asked to be called for follow up. She states it is okay to leave message.  Respiratory Medications Current: Trelegy Ellipta Tried in past: Symbicort (switched to Trelegy), albuterol inhaler prn Patient reports adherence challenges (cost)  Respiratory Symptomatology  Triggers: exertion, pollen/weather SOB: depends on amount of activity he does Chest pain: denies Coughing at night: 0 days/week Coughing with exertion: yes Frequency of rescue inhaler use: once a week  ER visits in last year: 4-5x   Hospitalizations in last year: 1x  Objective: Allergies  Allergen Reactions  . Penicillins Rash    No problems with ampicillin during hospitalization    Outpatient Encounter Medications as of 09/05/2019  Medication Sig Note  . ALPRAZolam (XANAX) 1 MG tablet  Take 1 tablet (1 mg total) by mouth 3 (three) times daily as needed for anxiety.   Marland Kitchen amLODipine (NORVASC) 5 MG tablet TAKE 1 TABLET BY MOUTH EVERY DAY   . buprenorphine (SUBUTEX) 8 MG SUBL SL tablet Place 16 mg under the tongue daily.  01/25/2019: LF @ [facility] on 01-04-19 # 20 DS 20  . fluticasone (FLONASE) 50 MCG/ACT nasal spray Place 2 sprays into both nostrils daily.    . Fluticasone-Umeclidin-Vilant (TRELEGY ELLIPTA) 100-62.5-25 MCG/INH AEPB Inhale 1 puff into the lungs daily.   Marland Kitchen gabapentin (NEURONTIN) 400 MG capsule Take 1 capsule (400 mg total) by mouth every 8 (eight) hours as needed.   . glyBURIDE (DIABETA) 5 MG tablet Take 10 mg by mouth 2 (two) times daily with a meal.    . levocetirizine (XYZAL) 5 MG tablet Take 1 tablet by mouth daily.   . metFORMIN (GLUCOPHAGE) 1000 MG tablet Take 1,000 mg by mouth 2 (two) times daily with a meal.    . pantoprazole (PROTONIX) 40 MG tablet Take 1 tablet (40 mg total) by mouth daily before breakfast.   . rosuvastatin (CRESTOR) 20 MG tablet TAKE 1 TABLET BY MOUTH EVERY DAY   . valsartan (DIOVAN) 80 MG tablet Take 80 mg by mouth daily.    No facility-administered encounter medications on file as of 09/05/2019.     Immunization History  Administered Date(s) Administered  . Influenza, High Dose Seasonal PF 04/09/2017, 04/22/2018, 05/02/2019  . Tdap 02/04/2018     PFTs PFT Results Latest Ref Rng & Units 04/17/2018  FVC-Pre L 3.02  FVC-Predicted Pre % 87  FVC-Post L 3.20  FVC-Predicted Post % 92  Pre FEV1/FVC % % 82  Post FEV1/FCV % % 77  FEV1-Pre L  2.49  FEV1-Predicted Pre % 100  FEV1-Post L 2.45  DLCO UNC% % 69  DLCO COR %Predicted % 86  TLC L 7.59  TLC % Predicted % 125  RV % Predicted % 199     Chest X-ray (04/22/2019) Scattered subcentimeter nodules, tree-in-bud opacities. Multiple patchy areas of groundglass in the upper lobe  Eosinophils (02/18/2019) Most recent blood eosinophil count was 1.4 cells/microL  PIFR  Inspiratory  flow measured using the In-check DIAL G16 and was in range of 30-90 for use of Medium DPI device (Ellipta). Patient scored 85 upon retraining. Originally patient was taking too deep of a breath for Ellipta device.   Assessment and Plan  1. Inhaler Optimization  a. PIFR i. Inspiratory flow measured using the In-check DIAL G16 and was in range of 30-90 for use of Medium DPI device (Ellipta). Patient scored 79. b. Optimal inhaler for patient would be Trelegy Ellipta considering it has been efficacious for him previously and his PIFR score is appropriate for Ellipta device. Will apply for asthma grant to assist with cost of copay. Patient and patient's sister verbalized understanding.  i. Patient was counseled on the purpose, proper use, and adverse effects of Trelegy Ellipta inhaler.  Instructed patient to rinse mouth with water after using in order to prevent fungal infection.  Patient verbalized understanding. ii. Reviewed appropriate use of maintenance vs rescue inhalers.  Stressed importance of using maintenance inhaler daily and rescue inhaler only as needed.  Patient verbalized understanding. iii. Demonstrated proper inhaler technique using Ellipta demo inhaler.  Patient able to demonstrate proper inhaler technique using teach back method. Patient was given two samples in office today (lot number [id number], expiration date 12/07/2020).    2. Medication Reconciliation a. A drug regimen assessment was performed, including review of allergies, interactions, disease-state management, dosing and immunization history. Medications were reviewed with the patient, including name, instructions, indication, goals of therapy, potential side effects, importance of adherence, and safe use. b. Drug interaction(s): no concerning interactions identified   3. Immunizations a. Patient is indicated for the pneumonia and shingles vaccinations. iDurene Cal in prescription for shingles - patient is willing to get shingles  vaccination if his insurance will cover shingles vaccination.  ii. States he has already received pneumonia vaccination.   This appointment required 30 minutes of patient care (this includes precharting, chart review, review of results, face-to-face care, etc.).  Thank you for involving pharmacy to assist in providing this patient's care.   Drexel Iha, PharmD PGY2 Ambulatory Care Pharmacy Resident

## 2019-09-03 ENCOUNTER — Other Ambulatory Visit (INDEPENDENT_AMBULATORY_CARE_PROVIDER_SITE_OTHER): Payer: Self-pay | Admitting: Internal Medicine

## 2019-09-03 DIAGNOSIS — E119 Type 2 diabetes mellitus without complications: Secondary | ICD-10-CM | POA: Diagnosis not present

## 2019-09-03 DIAGNOSIS — I10 Essential (primary) hypertension: Secondary | ICD-10-CM | POA: Diagnosis not present

## 2019-09-04 ENCOUNTER — Telehealth: Payer: Self-pay | Admitting: Pharmacy Technician

## 2019-09-04 NOTE — Telephone Encounter (Signed)
Attempted to call patient, no answer/ voicemail. There is an Asthma copay assistance grant open and we need his information to see if he will qualify. Need patient's household size and estimated household income.  Will follow up at his appointment with the pharmacy team tomorrow.  4:21 PM Beatriz Chancellor, CPhT

## 2019-09-05 ENCOUNTER — Ambulatory Visit (INDEPENDENT_AMBULATORY_CARE_PROVIDER_SITE_OTHER): Payer: Medicare Other | Admitting: Pulmonary Disease

## 2019-09-05 ENCOUNTER — Encounter: Payer: Self-pay | Admitting: Pulmonary Disease

## 2019-09-05 ENCOUNTER — Other Ambulatory Visit: Payer: Self-pay

## 2019-09-05 ENCOUNTER — Ambulatory Visit (INDEPENDENT_AMBULATORY_CARE_PROVIDER_SITE_OTHER): Payer: Medicare Other | Admitting: Pharmacist

## 2019-09-05 VITALS — BP 118/74 | HR 64 | Temp 98.1°F | Ht 65.0 in | Wt 163.6 lb

## 2019-09-05 DIAGNOSIS — R918 Other nonspecific abnormal finding of lung field: Secondary | ICD-10-CM | POA: Diagnosis not present

## 2019-09-05 DIAGNOSIS — I25118 Atherosclerotic heart disease of native coronary artery with other forms of angina pectoris: Secondary | ICD-10-CM | POA: Diagnosis not present

## 2019-09-05 DIAGNOSIS — J441 Chronic obstructive pulmonary disease with (acute) exacerbation: Secondary | ICD-10-CM | POA: Diagnosis not present

## 2019-09-05 DIAGNOSIS — J4540 Moderate persistent asthma, uncomplicated: Secondary | ICD-10-CM

## 2019-09-05 LAB — CBC WITH DIFFERENTIAL/PLATELET
Basophils Absolute: 0.1 10*3/uL (ref 0.0–0.1)
Basophils Relative: 1.1 % (ref 0.0–3.0)
Eosinophils Absolute: 0.5 10*3/uL (ref 0.0–0.7)
Eosinophils Relative: 7 % — ABNORMAL HIGH (ref 0.0–5.0)
HCT: 34.5 % — ABNORMAL LOW (ref 39.0–52.0)
Hemoglobin: 11.4 g/dL — ABNORMAL LOW (ref 13.0–17.0)
Lymphocytes Relative: 30.2 % (ref 12.0–46.0)
Lymphs Abs: 2 10*3/uL (ref 0.7–4.0)
MCHC: 33 g/dL (ref 30.0–36.0)
MCV: 83.6 fl (ref 78.0–100.0)
Monocytes Absolute: 0.7 10*3/uL (ref 0.1–1.0)
Monocytes Relative: 10.7 % (ref 3.0–12.0)
Neutro Abs: 3.5 10*3/uL (ref 1.4–7.7)
Neutrophils Relative %: 51 % (ref 43.0–77.0)
Platelets: 252 10*3/uL (ref 150.0–400.0)
RBC: 4.13 Mil/uL — ABNORMAL LOW (ref 4.22–5.81)
RDW: 15.6 % — ABNORMAL HIGH (ref 11.5–15.5)
WBC: 6.8 10*3/uL (ref 4.0–10.5)

## 2019-09-05 MED ORDER — ZOSTER VAC RECOMB ADJUVANTED 50 MCG/0.5ML IM SUSR: 0.5000 mL | Freq: Once | INTRAMUSCULAR | 1 refills | Status: AC

## 2019-09-05 NOTE — Telephone Encounter (Signed)
Called patient and advised of grant and he will contact pharmacy to confirm when he can get his inhaler.

## 2019-09-05 NOTE — Patient Instructions (Addendum)
I am glad you are feeling better after your recent problems with breathing We will get you to the pharmacist to make sure to review your medications and make sure that you get samples and patient assistance as you need to be on regular inhalers to prevent exacerbations  We will get a high-res CT in 2-3 months to reassess lung infiltrates Follow-up in clinic after test.

## 2019-09-05 NOTE — Progress Notes (Addendum)
General Filosa    AN:[id number]    May 23, 1946  Primary Care Physician:Shah, Weldon Picking, MD  Referring Physician: Monico Blitz, [place],  [place] [address]  Chief complaint: Follow up for asthma, emphysema, chronic bronchitis, abnormal CT scan  HPI: 74 year old with history of allergic rhinitis, hyperlipidemia, type 2 diabetes, former smoker Treated for pneumonia in May-June 2019.  He required 3 rounds of antibiotic to finally get symptoms under control.  He had imaging including CT of the chest which showed bilateral infiltrates, nodular opacities and has been referred here for further evaluation.  Follow-up CT shows resolution of infiltrate.  Pets: Has a dog, no cats, birds, farm animals Occupation: Used to work from [employer].  Currently retired Exposures: No known exposures, no leak, hot tub, Jacuzzi, mold Smoking history: 35-pack-year smoker.  Quit in 1994 Travel history: No significant travel.  Interim history: Has had recurrent episodes of dyspnea requiring prednisone.  Recently treated with antibiotics and prednisone 2 weeks ago from primary care Getting the right inhalers for him has been an issue due to insurance issues and he cannot afford the medication He is not on controller medication and just using albuterol as needed Not on any maintenance medication  He was prescribed Symbicort but did not use it on a regular basis.  Trelegy was too expensive  Sees Dr. Ernst Bowler, allergy who has also noted that he is not getting regular inhaler therapy. Follows with Dr. Laural Golden, GI with recent scope showing healed esophagitis, benign stricture and Schatzki's ring.  Outpatient Encounter Medications as of 09/05/2019  Medication Sig  . ALPRAZolam (XANAX) 1 MG tablet Take 1 tablet (1 mg total) by mouth 3 (three) times daily as needed for anxiety.  Marland Kitchen amLODipine (NORVASC) 5 MG tablet TAKE 1 TABLET BY MOUTH EVERY DAY  . buprenorphine (SUBUTEX) 8 MG SUBL SL tablet  Place 16 mg under the tongue daily.   . fluticasone (FLONASE) 50 MCG/ACT nasal spray Place 2 sprays into both nostrils daily.   . Fluticasone-Umeclidin-Vilant (TRELEGY ELLIPTA) 100-62.5-25 MCG/INH AEPB Inhale 1 puff into the lungs daily.  Marland Kitchen gabapentin (NEURONTIN) 400 MG capsule Take 1 capsule (400 mg total) by mouth every 8 (eight) hours as needed.  . glyBURIDE (DIABETA) 5 MG tablet Take 10 mg by mouth 2 (two) times daily with a meal.   . levocetirizine (XYZAL) 5 MG tablet Take 1 tablet by mouth daily.  . metFORMIN (GLUCOPHAGE) 1000 MG tablet Take 1,000 mg by mouth 2 (two) times daily with a meal.   . pantoprazole (PROTONIX) 40 MG tablet TAKE 1 TABLET BY MOUTH TWICE DAILY BEFORE A MEAL  . rosuvastatin (CRESTOR) 20 MG tablet TAKE 1 TABLET BY MOUTH EVERY DAY  . valsartan (DIOVAN) 80 MG tablet Take 80 mg by mouth daily.   No facility-administered encounter medications on file as of 09/05/2019.   Physical Exam: Blood pressure 118/74, pulse 64, temperature 98.1 F (36.7 C), temperature source Temporal, height 5\' 5"  (1.651 m), weight 163 lb 9.6 oz (74.2 kg), SpO2 99 %. Gen:      No acute distress HEENT:  EOMI, sclera anicteric Neck:     No masses; no thyromegaly Lungs:    Clear to auscultation bilaterally; normal respiratory effort CV:         Regular rate and rhythm; no murmurs Abd:      + bowel sounds; soft, non-tender; no palpable masses, no distension Ext:    No edema; adequate peripheral perfusion  Skin:      Warm and dry; no rash Neuro: alert and oriented x 3 Psych: normal mood and affect  Data Reviewed: Imaging CT chest 12/21/2017- bilateral peribronchovascular nodularity, groundglass opacities left greater than right.  Dominant 1.1 cm left upper lobe nodule.  Mild centrilobular, paraseptal emphysema.  Left main and three-vessel coronary atherosclerosis.  Small hiatal hernia.    CT chest 04/25/2018- improvement in left upper lobe patchy airspace opacities, mild tree-in-bud in the upper  lobes and lower lobes.  Peripheral subcentimeter pulmonary nodules.,  Aortic atherosclerosis  CT scan 04/22/2019-scattered subcentimeter nodules, tree-in-bud opacities.  Multiple patchy areas of groundglass in the upper lobe.  I have reviewed the images personally.  PFTs 04/17/2018 FVC 3.20 [92%), FEV1 2.45 [98%), F/F 77, TLC 125%, RV/DL 361%, DLCO 69% No obstruction, air trapping Mild reduction diffusion capacity.  Labs: CBC 02/18/2019-WBC 5.7, eos 25.2%, absolute eosinophil count 1436 Respiratory allergy profile-IgE 117, RAST panel- Negative Alpha-1 antitrypsin 02/18/2019-172, PI MM Mold profile 03/06/2019-negative  Sputum cultures 11/6-20-negative  Procedures: EGD 05/19/2019- - Scar in the distal esophagus. Healed esophagitis. - Benign-appearing esophageal stenosis proximal to GEJ. Dilated. - Non-obstructing Schatzki ring. - 3 cm hiatal hernia. - Normal stomach. - Normal duodenal bulb and second portion of the duodenum.   Assessment:  Asthma, emphysema, chronic bronchitis with exacerbation PFTs did not show any significant obstruction but he does have emphysema and air trapping. Unable to afford controller medications due to his high deductible plan  We will try to get him on some kind of patient assistance Refer to pharmacy for help with patient assistance, inhaler training  Abnormal CT CT reviewed with waxing and waning pulmonary infiltrates with groundglass opacities.  He does not have any significant exposure history  Labs noted for elevated peripheral eosinophils.  He may have an eosinophilic driven process such as eosinophilic pneumonia.  Recheck CBC and CT If he has persistent infiltrates then plan for bronchoscopy Consider biologic therapy but he needs to be on stable inhaler regimen first and insurance may be an issue  Chronic rhinitis, allergies Follows with Dr. Ernst Bowler  GERD, esophagitis, esophageal stricture Suspect significant contribution from GERD to  ongoing cough Follows with GI and is on PPI therapy.  We will discuss with Dr. Laural Golden if this could be eosinophilic esophagitis  Health maintenance States that he is up-to-date with Pneumovax ad flu at his primary care  Plan/Recommendations: - Trelegy inhaler samples, albuterol as needed - Repeat CBC, HRCT - Pharmacy consult  Marshell Garfinkel MD [facility] 09/05/2019, 8:34 AM  CC: Monico Blitz, MD

## 2019-09-05 NOTE — Telephone Encounter (Signed)
Patient provided income and household size at appointment today. #2/ $16,800. Patient was Approved for$1750  PAF Asthma grant for copay assistance. Coverage dates are 09/05/19- 09/04/20.   [id number] ID- [id number] Grp- [id number] PCN- [id number]  Called [facility] and provided billing information. Tech was able to process Trelegy rx for $0.00 copay. Called patient, noanswer or voicemail.

## 2019-09-09 DIAGNOSIS — E1129 Type 2 diabetes mellitus with other diabetic kidney complication: Secondary | ICD-10-CM | POA: Diagnosis not present

## 2019-09-09 DIAGNOSIS — J449 Chronic obstructive pulmonary disease, unspecified: Secondary | ICD-10-CM | POA: Diagnosis not present

## 2019-09-09 DIAGNOSIS — E1165 Type 2 diabetes mellitus with hyperglycemia: Secondary | ICD-10-CM | POA: Diagnosis not present

## 2019-09-09 DIAGNOSIS — R809 Proteinuria, unspecified: Secondary | ICD-10-CM | POA: Diagnosis not present

## 2019-09-09 DIAGNOSIS — I10 Essential (primary) hypertension: Secondary | ICD-10-CM | POA: Diagnosis not present

## 2019-09-09 DIAGNOSIS — Z6826 Body mass index (BMI) 26.0-26.9, adult: Secondary | ICD-10-CM | POA: Diagnosis not present

## 2019-09-09 DIAGNOSIS — Z299 Encounter for prophylactic measures, unspecified: Secondary | ICD-10-CM | POA: Diagnosis not present

## 2019-09-18 DIAGNOSIS — Z79891 Long term (current) use of opiate analgesic: Secondary | ICD-10-CM | POA: Diagnosis not present

## 2019-09-18 DIAGNOSIS — M545 Low back pain: Secondary | ICD-10-CM | POA: Diagnosis not present

## 2019-09-18 DIAGNOSIS — M25569 Pain in unspecified knee: Secondary | ICD-10-CM | POA: Diagnosis not present

## 2019-09-18 DIAGNOSIS — M542 Cervicalgia: Secondary | ICD-10-CM | POA: Diagnosis not present

## 2019-09-18 DIAGNOSIS — M130 Polyarthritis, unspecified: Secondary | ICD-10-CM | POA: Diagnosis not present

## 2019-09-24 DIAGNOSIS — Z23 Encounter for immunization: Secondary | ICD-10-CM | POA: Diagnosis not present

## 2019-09-29 DIAGNOSIS — I10 Essential (primary) hypertension: Secondary | ICD-10-CM | POA: Diagnosis not present

## 2019-09-29 DIAGNOSIS — E119 Type 2 diabetes mellitus without complications: Secondary | ICD-10-CM | POA: Diagnosis not present

## 2019-10-06 DIAGNOSIS — L570 Actinic keratosis: Secondary | ICD-10-CM | POA: Diagnosis not present

## 2019-10-07 DIAGNOSIS — E1165 Type 2 diabetes mellitus with hyperglycemia: Secondary | ICD-10-CM | POA: Diagnosis not present

## 2019-10-07 DIAGNOSIS — Z299 Encounter for prophylactic measures, unspecified: Secondary | ICD-10-CM | POA: Diagnosis not present

## 2019-10-07 DIAGNOSIS — J449 Chronic obstructive pulmonary disease, unspecified: Secondary | ICD-10-CM | POA: Diagnosis not present

## 2019-10-07 DIAGNOSIS — I10 Essential (primary) hypertension: Secondary | ICD-10-CM | POA: Diagnosis not present

## 2019-10-21 ENCOUNTER — Other Ambulatory Visit: Payer: Self-pay | Admitting: Cardiovascular Disease

## 2019-10-22 DIAGNOSIS — Z23 Encounter for immunization: Secondary | ICD-10-CM | POA: Diagnosis not present

## 2019-10-27 DIAGNOSIS — I10 Essential (primary) hypertension: Secondary | ICD-10-CM | POA: Diagnosis not present

## 2019-10-27 DIAGNOSIS — E119 Type 2 diabetes mellitus without complications: Secondary | ICD-10-CM | POA: Diagnosis not present

## 2019-10-29 ENCOUNTER — Other Ambulatory Visit: Payer: Self-pay | Admitting: Pulmonary Disease

## 2019-11-10 DIAGNOSIS — F329 Major depressive disorder, single episode, unspecified: Secondary | ICD-10-CM | POA: Diagnosis not present

## 2019-11-24 ENCOUNTER — Ambulatory Visit (HOSPITAL_COMMUNITY): Payer: Medicare Other

## 2019-11-24 ENCOUNTER — Encounter (HOSPITAL_COMMUNITY): Payer: Self-pay | Admitting: *Deleted

## 2019-11-24 ENCOUNTER — Emergency Department (HOSPITAL_COMMUNITY): Payer: Medicare Other

## 2019-11-24 ENCOUNTER — Inpatient Hospital Stay (HOSPITAL_COMMUNITY)
Admission: EM | Admit: 2019-11-24 | Discharge: 2019-11-26 | DRG: 193 | Disposition: A | Discharge status: Home / Self Care | Payer: Medicare Other | Attending: Family Medicine | Admitting: Family Medicine

## 2019-11-24 ENCOUNTER — Other Ambulatory Visit: Payer: Self-pay

## 2019-11-24 DIAGNOSIS — R4182 Altered mental status, unspecified: Secondary | ICD-10-CM

## 2019-11-24 DIAGNOSIS — K2100 Gastro-esophageal reflux disease with esophagitis, without bleeding: Secondary | ICD-10-CM

## 2019-11-24 DIAGNOSIS — M1990 Unspecified osteoarthritis, unspecified site: Secondary | ICD-10-CM | POA: Diagnosis present

## 2019-11-24 DIAGNOSIS — J9601 Acute respiratory failure with hypoxia: Secondary | ICD-10-CM

## 2019-11-24 DIAGNOSIS — Z880 Allergy status to penicillin: Secondary | ICD-10-CM | POA: Diagnosis not present

## 2019-11-24 DIAGNOSIS — R509 Fever, unspecified: Secondary | ICD-10-CM

## 2019-11-24 DIAGNOSIS — Z79899 Other long term (current) drug therapy: Secondary | ICD-10-CM

## 2019-11-24 DIAGNOSIS — R1314 Dysphagia, pharyngoesophageal phase: Secondary | ICD-10-CM | POA: Diagnosis present

## 2019-11-24 DIAGNOSIS — G479 Sleep disorder, unspecified: Secondary | ICD-10-CM | POA: Diagnosis not present

## 2019-11-24 DIAGNOSIS — R1319 Other dysphagia: Secondary | ICD-10-CM

## 2019-11-24 DIAGNOSIS — E861 Hypovolemia: Secondary | ICD-10-CM | POA: Diagnosis present

## 2019-11-24 DIAGNOSIS — Z20822 Contact with and (suspected) exposure to covid-19: Secondary | ICD-10-CM | POA: Diagnosis present

## 2019-11-24 DIAGNOSIS — E785 Hyperlipidemia, unspecified: Secondary | ICD-10-CM | POA: Diagnosis present

## 2019-11-24 DIAGNOSIS — Z7984 Long term (current) use of oral hypoglycemic drugs: Secondary | ICD-10-CM

## 2019-11-24 DIAGNOSIS — I10 Essential (primary) hypertension: Secondary | ICD-10-CM | POA: Diagnosis not present

## 2019-11-24 DIAGNOSIS — R630 Anorexia: Secondary | ICD-10-CM | POA: Diagnosis present

## 2019-11-24 DIAGNOSIS — E1369 Other specified diabetes mellitus with other specified complication: Secondary | ICD-10-CM | POA: Diagnosis present

## 2019-11-24 DIAGNOSIS — E871 Hypo-osmolality and hyponatremia: Secondary | ICD-10-CM | POA: Diagnosis present

## 2019-11-24 DIAGNOSIS — K219 Gastro-esophageal reflux disease without esophagitis: Secondary | ICD-10-CM | POA: Diagnosis present

## 2019-11-24 DIAGNOSIS — J189 Pneumonia, unspecified organism: Principal | ICD-10-CM | POA: Diagnosis present

## 2019-11-24 DIAGNOSIS — G894 Chronic pain syndrome: Secondary | ICD-10-CM | POA: Diagnosis present

## 2019-11-24 DIAGNOSIS — I6789 Other cerebrovascular disease: Secondary | ICD-10-CM | POA: Diagnosis not present

## 2019-11-24 DIAGNOSIS — B359 Dermatophytosis, unspecified: Secondary | ICD-10-CM | POA: Diagnosis present

## 2019-11-24 DIAGNOSIS — R1310 Dysphagia, unspecified: Secondary | ICD-10-CM

## 2019-11-24 DIAGNOSIS — J439 Emphysema, unspecified: Secondary | ICD-10-CM | POA: Diagnosis present

## 2019-11-24 DIAGNOSIS — Z87891 Personal history of nicotine dependence: Secondary | ICD-10-CM

## 2019-11-24 DIAGNOSIS — F419 Anxiety disorder, unspecified: Secondary | ICD-10-CM | POA: Diagnosis present

## 2019-11-24 DIAGNOSIS — D649 Anemia, unspecified: Secondary | ICD-10-CM | POA: Diagnosis present

## 2019-11-24 DIAGNOSIS — R05 Cough: Secondary | ICD-10-CM | POA: Diagnosis not present

## 2019-11-24 LAB — CBC WITH DIFFERENTIAL/PLATELET
Abs Immature Granulocytes: 0.04 10*3/uL (ref 0.00–0.07)
Basophils Absolute: 0 10*3/uL (ref 0.0–0.1)
Basophils Relative: 0 %
Eosinophils Absolute: 0.1 10*3/uL (ref 0.0–0.5)
Eosinophils Relative: 1 %
HCT: 34.4 % — ABNORMAL LOW (ref 39.0–52.0)
Hemoglobin: 11 g/dL — ABNORMAL LOW (ref 13.0–17.0)
Immature Granulocytes: 0 %
Lymphocytes Relative: 8 %
Lymphs Abs: 0.7 10*3/uL (ref 0.7–4.0)
MCH: 28.5 pg (ref 26.0–34.0)
MCHC: 32 g/dL (ref 30.0–36.0)
MCV: 89.1 fL (ref 80.0–100.0)
Monocytes Absolute: 0.5 10*3/uL (ref 0.1–1.0)
Monocytes Relative: 6 %
Neutro Abs: 7.7 10*3/uL (ref 1.7–7.7)
Neutrophils Relative %: 85 %
Platelets: 241 10*3/uL (ref 150–400)
RBC: 3.86 MIL/uL — ABNORMAL LOW (ref 4.22–5.81)
RDW: 14 % (ref 11.5–15.5)
WBC: 9.1 10*3/uL (ref 4.0–10.5)
nRBC: 0 % (ref 0.0–0.2)

## 2019-11-24 LAB — COMPREHENSIVE METABOLIC PANEL
ALT: 23 U/L (ref 0–44)
AST: 55 U/L — ABNORMAL HIGH (ref 15–41)
Albumin: 3.6 g/dL (ref 3.5–5.0)
Alkaline Phosphatase: 50 U/L (ref 38–126)
Anion gap: 10 (ref 5–15)
BUN: 11 mg/dL (ref 8–23)
CO2: 24 mmol/L (ref 22–32)
Calcium: 8.5 mg/dL — ABNORMAL LOW (ref 8.9–10.3)
Chloride: 96 mmol/L — ABNORMAL LOW (ref 98–111)
Creatinine, Ser: 0.98 mg/dL (ref 0.61–1.24)
GFR calc Af Amer: >60 mL/min (ref >60)
GFR calc non Af Amer: >60 mL/min (ref >60)
Glucose, Bld: 102 mg/dL — ABNORMAL HIGH (ref 70–99)
Potassium: 3.7 mmol/L (ref 3.5–5.1)
Sodium: 130 mmol/L — ABNORMAL LOW (ref 135–145)
Total Bilirubin: 1.3 mg/dL — ABNORMAL HIGH (ref 0.3–1.2)
Total Protein: 6.6 g/dL (ref 6.5–8.1)

## 2019-11-24 LAB — URINALYSIS, ROUTINE W REFLEX MICROSCOPIC
Bacteria, UA: NONE SEEN
Bilirubin Urine: NEGATIVE
Glucose, UA: NEGATIVE mg/dL
Ketones, ur: NEGATIVE mg/dL
Leukocytes,Ua: NEGATIVE
Nitrite: NEGATIVE
Protein, ur: 30 mg/dL — AB
Specific Gravity, Urine: 1.01 (ref 1.005–1.030)
pH: 7 (ref 5.0–8.0)

## 2019-11-24 LAB — MRSA PCR SCREENING: MRSA by PCR: NEGATIVE

## 2019-11-24 LAB — LACTIC ACID, PLASMA
Lactic Acid, Venous: 1.4 mmol/L (ref 0.5–1.9)
Lactic Acid, Venous: 0.9 mmol/L (ref 0.5–1.9)

## 2019-11-24 LAB — BRAIN NATRIURETIC PEPTIDE: B Natriuretic Peptide: 211 pg/mL — ABNORMAL HIGH (ref 0.0–100.0)

## 2019-11-24 LAB — SARS CORONAVIRUS 2 BY RT PCR (HOSPITAL ORDER, PERFORMED IN ~~LOC~~ HOSPITAL LAB): SARS Coronavirus 2: NEGATIVE

## 2019-11-24 MED ORDER — POLYETHYLENE GLYCOL 3350 17 G PO PACK
17.0000 g | PACK | Freq: Two times a day (BID) | ORAL | Status: DC
  Administered 2019-11-24 – 2019-11-26 (×4): 17 g via ORAL
  Filled 2019-11-24 (×4): qty 1

## 2019-11-24 MED ORDER — BUPRENORPHINE HCL-NALOXONE HCL 2-0.5 MG SL SUBL: 1.0000 | SUBLINGUAL_TABLET | Freq: Every day | SUBLINGUAL | Status: DC

## 2019-11-24 MED ORDER — ONDANSETRON HCL 4 MG/2ML IJ SOLN: 4.0000 mg | Freq: Four times a day (QID) | INTRAMUSCULAR | Status: DC | PRN

## 2019-11-24 MED ORDER — ALBUTEROL SULFATE HFA 108 (90 BASE) MCG/ACT IN AERS
2.0000 | INHALATION_SPRAY | Freq: Once | RESPIRATORY_TRACT | Status: AC
  Administered 2019-11-24: 2 via RESPIRATORY_TRACT
  Filled 2019-11-24: qty 6.7

## 2019-11-24 MED ORDER — SODIUM CHLORIDE 0.9 % IV SOLN
2.0000 g | Freq: Once | INTRAVENOUS | Status: DC
Start: 2019-11-24 — End: 2019-11-24

## 2019-11-24 MED ORDER — SODIUM CHLORIDE 0.9 % IV SOLN: INTRAVENOUS | Status: DC

## 2019-11-24 MED ORDER — METRONIDAZOLE IN NACL 5-0.79 MG/ML-% IV SOLN
500.0000 mg | Freq: Once | INTRAVENOUS | Status: AC
  Administered 2019-11-24: 500 mg via INTRAVENOUS
  Filled 2019-11-24: qty 100

## 2019-11-24 MED ORDER — IPRATROPIUM-ALBUTEROL 0.5-2.5 (3) MG/3ML IN SOLN
3.0000 mL | Freq: Four times a day (QID) | RESPIRATORY_TRACT | Status: DC
  Administered 2019-11-24 – 2019-11-25 (×3): 3 mL via RESPIRATORY_TRACT
  Filled 2019-11-24: qty 3

## 2019-11-24 MED ORDER — GUAIFENESIN-DM 100-10 MG/5ML PO SYRP: 5.0000 mL | ORAL_SOLUTION | ORAL | Status: DC | PRN

## 2019-11-24 MED ORDER — ALPRAZOLAM 1 MG PO TABS
1.0000 mg | ORAL_TABLET | Freq: Three times a day (TID) | ORAL | Status: DC | PRN
  Administered 2019-11-25: 1 mg via ORAL
  Filled 2019-11-24: qty 1

## 2019-11-24 MED ORDER — ENOXAPARIN SODIUM 40 MG/0.4ML ~~LOC~~ SOLN
40.0000 mg | SUBCUTANEOUS | Status: DC
  Administered 2019-11-24 – 2019-11-25 (×2): 40 mg via SUBCUTANEOUS
  Filled 2019-11-24 (×2): qty 0.4

## 2019-11-24 MED ORDER — BUPRENORPHINE HCL-NALOXONE HCL 8-2 MG SL SUBL
1.0000 | SUBLINGUAL_TABLET | Freq: Every day | SUBLINGUAL | Status: DC
  Administered 2019-11-25 – 2019-11-26 (×2): 1 via SUBLINGUAL
  Filled 2019-11-24 (×3): qty 1

## 2019-11-24 MED ORDER — ONDANSETRON HCL 4 MG PO TABS: 4.0000 mg | ORAL_TABLET | Freq: Four times a day (QID) | ORAL | Status: DC | PRN

## 2019-11-24 MED ORDER — ACETAMINOPHEN 325 MG PO TABS
650.0000 mg | ORAL_TABLET | Freq: Once | ORAL | Status: AC
  Administered 2019-11-24: 650 mg via ORAL
  Filled 2019-11-24: qty 2

## 2019-11-24 MED ORDER — PANTOPRAZOLE SODIUM 40 MG PO TBEC
40.0000 mg | DELAYED_RELEASE_TABLET | Freq: Two times a day (BID) | ORAL | Status: DC
  Administered 2019-11-24 – 2019-11-26 (×4): 40 mg via ORAL
  Filled 2019-11-24 (×4): qty 1

## 2019-11-24 MED ORDER — VANCOMYCIN HCL 750 MG/150ML IV SOLN
750.0000 mg | Freq: Two times a day (BID) | INTRAVENOUS | Status: DC
  Filled 2019-11-24: qty 150

## 2019-11-24 MED ORDER — AEROCHAMBER PLUS FLO-VU MEDIUM MISC
1.0000 | Freq: Once | Status: AC
  Administered 2019-11-24: 1
  Filled 2019-11-24 (×2): qty 1

## 2019-11-24 MED ORDER — ACETAMINOPHEN 325 MG PO TABS
650.0000 mg | ORAL_TABLET | Freq: Four times a day (QID) | ORAL | Status: DC | PRN
  Administered 2019-11-25: 650 mg via ORAL
  Filled 2019-11-24: qty 2

## 2019-11-24 MED ORDER — GABAPENTIN 400 MG PO CAPS: 400.0000 mg | ORAL_CAPSULE | Freq: Three times a day (TID) | ORAL | Status: DC | PRN

## 2019-11-24 MED ORDER — AZITHROMYCIN 250 MG PO TABS
500.0000 mg | ORAL_TABLET | Freq: Every day | ORAL | Status: DC
  Administered 2019-11-24 – 2019-11-25 (×2): 500 mg via ORAL
  Filled 2019-11-24 (×2): qty 2

## 2019-11-24 MED ORDER — SODIUM CHLORIDE 0.9 % IV SOLN: 2.0000 g | Freq: Two times a day (BID) | INTRAVENOUS | Status: DC

## 2019-11-24 MED ORDER — ACETAMINOPHEN 650 MG RE SUPP: 650.0000 mg | Freq: Four times a day (QID) | RECTAL | Status: DC | PRN

## 2019-11-24 MED ORDER — IPRATROPIUM-ALBUTEROL 0.5-2.5 (3) MG/3ML IN SOLN: 3.0000 mL | RESPIRATORY_TRACT | Status: DC | PRN

## 2019-11-24 MED ORDER — SODIUM CHLORIDE 0.9 % IV SOLN
1.0000 g | INTRAVENOUS | Status: DC
  Administered 2019-11-25 – 2019-11-26 (×2): 1 g via INTRAVENOUS
  Filled 2019-11-24 (×2): qty 10

## 2019-11-24 MED ORDER — VANCOMYCIN HCL IN DEXTROSE 1-5 GM/200ML-% IV SOLN
1000.0000 mg | Freq: Once | INTRAVENOUS | Status: DC
  Filled 2019-11-24: qty 200

## 2019-11-24 MED ORDER — VANCOMYCIN HCL 1500 MG/300ML IV SOLN
1500.0000 mg | Freq: Once | INTRAVENOUS | Status: AC
  Administered 2019-11-24: 1500 mg via INTRAVENOUS
  Filled 2019-11-24: qty 300

## 2019-11-24 MED ORDER — SODIUM CHLORIDE 0.9 % IV SOLN
2.0000 g | Freq: Once | INTRAVENOUS | Status: AC
  Administered 2019-11-24: 2 g via INTRAVENOUS
  Filled 2019-11-24: qty 2

## 2019-11-24 MED ORDER — ROSUVASTATIN CALCIUM 20 MG PO TABS
20.0000 mg | ORAL_TABLET | Freq: Every day | ORAL | Status: DC
  Administered 2019-11-24 – 2019-11-25 (×2): 20 mg via ORAL
  Filled 2019-11-24 (×2): qty 1

## 2019-11-24 NOTE — ED Notes (Signed)
Nurse spoke with sister and requested a head CT for dizziness. Stated pt has had several dizzy spells at home.

## 2019-11-24 NOTE — ED Triage Notes (Signed)
Pt states he is here because he can't sleep good; pt is alert and oriented x 4, although he said it was Tuesday, not Monday; pt denies any pain

## 2019-11-24 NOTE — ED Provider Notes (Signed)
  Face-to-face evaluation   History: Patient here for evaluation of difficulty sleeping at night, and sleepiness during the day.  He has a CPAP machine but uses it only in daytime.  He is moderately confused.  Physical exam: Elderly male who is alert and conversant.  He is generally weak and cannot support himself sitting up without assistance.  Lungs have rhonchi and coarse respiratory sounds.  No audible wheezing.  No increased work of breathing.  Medical screening examination/treatment/procedure(s) were conducted as a shared visit with non-physician practitioner(s) and myself.  I personally evaluated the patient during the encounter    Daleen Bo, MD 11/25/19 2317

## 2019-11-24 NOTE — H&P (Addendum)
History and Physical    Travis Beck [id number] DOB: 1946-01-10 DOA: 11/24/2019  PCP: Monico Blitz, MD   Patient coming from: Home   Chief Complaint: Altered mental status.   HPI: Travis Beck is a 74 y.o. male with medical history significant of arthritis, childhood asthma, type 2 diabetes mellitus and hypertension.   Patient reports 3 to 4 days of not feeling well, generalized malaise, difficulty sleeping and poor appetite.  He reports coughing, moderate in intensity, persistent for the last 4 days, tends to be dry, no improving or worsening factors.  He lives with his brother, he is able to do his activities of daily living independently.   He has been vaccinated for COVID-19.  ED Course: Patient was found febrile and hypoxic, 89% on room air.  His chest radiograph suggested of pneumonia.  He received cefepime, metronidazole, vancomycin and albuterol.  Fever control with acetaminophen.  Referred for further inpatient evaluation.  Review of Systems:  1. General: Positive fevers and chills, no weight gain or weight loss 2. ENT: No runny nose or sore throat, no hearing disturbances 3. Pulmonary: no dyspnea, but cough, with no wheezing, or hemoptysis 4. Cardiovascular: No angina, claudication, lower extremity edema, pnd or orthopnea 5. Gastrointestinal: No nausea,one episode of vomiting, with no diarrhea or constipation 6. Hematology: No easy bruisability or frequent infections 7. Urology: No dysuria, hematuria or increased urinary frequency 8. Dermatology: No rashes. 9. Neurology: No seizures or paresthesias 10. Musculoskeletal: No joint pain or deformities  Past Medical History:  Diagnosis Date  . Arthritis   . Asthma    Childhood  . Diabetes ([place])   . Hypertension     Past Surgical History:  Procedure Laterality Date  . BIOPSY  08/30/2018   Procedure: BIOPSY;  Surgeon: Rogene Houston, MD;  [facility];  Service: Endoscopy;;  gastric   .  COLONOSCOPY    . ESOPHAGEAL DILATION N/A 05/21/2019   Procedure: ESOPHAGEAL DILATION;  Surgeon: Rogene Houston, MD;  [facility]: [facility];  Service: Endoscopy;  Laterality: N/A;  . ESOPHAGOGASTRODUODENOSCOPY N/A 05/21/2019   Procedure: ESOPHAGOGASTRODUODENOSCOPY (EGD);  Surgeon: Rogene Houston, MD;  [facility];  Service: Endoscopy;  Laterality: N/A;  730  . ESOPHAGOGASTRODUODENOSCOPY (EGD) WITH PROPOFOL N/A 08/30/2018   Procedure: ESOPHAGOGASTRODUODENOSCOPY (EGD) WITH PROPOFOL;  Surgeon: Rogene Houston, MD;  [facility];  Service: Endoscopy;  Laterality: N/A;  1:30  . HERNIA REPAIR     bilateral lower abdomen     reports that he quit smoking about 26 years ago. His smoking use included cigarettes. He has a 66.00 pack-year smoking history. He has never used smokeless tobacco. He reports previous alcohol use. He reports that he does not use drugs.  Allergies  Allergen Reactions  . Penicillins Rash    No problems with ampicillin during hospitalization    History reviewed. No pertinent family history.   Prior to Admission medications   Medication Sig Start Date End Date Taking? Authorizing Provider  albuterol (VENTOLIN HFA) 108 (90 Base) MCG/ACT inhaler Inhale 1-2 puffs into the lungs every 4 (four) hours as needed.   Yes [provider]  ALPRAZolam Duanne Moron) 1 MG tablet Take 1 tablet (1 mg total) by mouth 3 (three) times daily as needed for anxiety. 01/28/19  Yes Welborn, Ryan, DO  amLODipine (NORVASC) 5 MG tablet TAKE 1 TABLET BY MOUTH EVERY DAY Patient taking differently: Take 5 mg by mouth daily.  10/21/19  Yes Kate Sable  A, MD  buprenorphine (SUBUTEX) 8 MG SUBL SL tablet Place 16 mg under the tongue daily.  01/04/19  Yes [provider]  fluticasone (FLONASE) 50 MCG/ACT nasal spray Place 2 sprays into both nostrils daily.  08/22/18  Yes [provider]  gabapentin (NEURONTIN) 400 MG capsule Take 1 capsule (400 mg total) by  mouth every 8 (eight) hours as needed. 01/28/19  Yes Welborn, Ryan, DO  glyBURIDE (DIABETA) 5 MG tablet Take 10 mg by mouth 2 (two) times daily with a meal.  11/19/14  Yes [provider]  ipratropium-albuterol (DUONEB) 0.5-2.5 (3) MG/3ML SOLN Inhale 3 mLs into the lungs every 6 (six) hours as needed.   Yes [provider]  levocetirizine (XYZAL) 5 MG tablet Take 1 tablet by mouth daily. 07/23/19  Yes [provider]  metFORMIN (GLUCOPHAGE) 1000 MG tablet Take 1,000 mg by mouth 2 (two) times daily with a meal.  12/07/14  Yes [provider]  pantoprazole (PROTONIX) 40 MG tablet TAKE 1 TABLET BY MOUTH TWICE DAILY BEFORE A MEAL Patient taking differently: Take 40 mg by mouth 2 (two) times daily.  09/03/19  Yes Laurine Blazer A, PA-C  rosuvastatin (CRESTOR) 20 MG tablet TAKE 1 TABLET BY MOUTH EVERY DAY Patient taking differently: Take 20 mg by mouth daily.  10/21/19  Yes Herminio Commons, MD  TRELEGY ELLIPTA 100-62.5-25 MCG/INH AEPB inhale ONE PUFF into THE lungs DAILY Patient taking differently: Inhale 1 puff into the lungs daily.  10/29/19  Yes Mannam, Praveen, MD  triamcinolone cream (KENALOG) 0.1 % Apply 1 application topically daily as needed. 11/05/19  Yes [provider]  valsartan (DIOVAN) 80 MG tablet Take 80 mg by mouth daily. 01/22/19  Yes [provider]    Physical Exam: Vitals:   11/24/19 1504 11/24/19 1530 11/24/19 1600 11/24/19 1606  BP:  108/64 (!) 102/56   Pulse:  84 85   Resp:  (!) 21 17   Temp: (!) 102.4 F (39.1 C)   (!) 101.3 F (38.5 C)  TempSrc: Oral   Oral  SpO2:  93% 99%   Weight:      Height:        Vitals:   11/24/19 1504 11/24/19 1530 11/24/19 1600 11/24/19 1606  BP:  108/64 (!) 102/56   Pulse:  84 85   Resp:  (!) 21 17   Temp: (!) 102.4 F (39.1 C)   (!) 101.3 F (38.5 C)  TempSrc: Oral   Oral  SpO2:  93% 99%   Weight:      Height:       General: Not in pain or dyspnea, deconditioned  Neurology:  Awake and alert, non focal Head and Neck. Head normocephalic. Neck supple with no adenopathy or thyromegaly.   E ENT: mild pallor, no icterus, oral mucosa moist Cardiovascular: No JVD. S1-S2 present, rhythmic, no gallops, rubs, or murmurs. No lower extremity edema. Pulmonary: positive breath sounds bilaterally, no wheezing, scattered rhonchi and rales. Gastrointestinal. Abdomen  with no organomegaly, non tender, no rebound or guarding Skin. No rashes Musculoskeletal: no joint deformities    Labs on Admission: I have personally reviewed following labs and imaging studies  CBC: Recent Labs  Lab 11/24/19 1235  WBC 9.1  NEUTROABS 7.7  HGB 11.0*  HCT 34.4*  MCV 89.1  PLT [id number]   Basic Metabolic Panel: Recent Labs  Lab 11/24/19 1235  NA 130*  K 3.7  CL 96*  CO2 24  GLUCOSE 102*  BUN 11  CREATININE 0.98  CALCIUM 8.5*   GFR: Estimated Creatinine Clearance: 56.7 mL/min (by C-G formula based on SCr of 0.98 mg/dL). Liver Function Tests: Recent Labs  Lab 11/24/19 1235  AST 55*  ALT 23  ALKPHOS 50  BILITOT 1.3*  PROT 6.6  ALBUMIN 3.6   No results for input(s): LIPASE, AMYLASE in the last 168 hours. No results for input(s): AMMONIA in the last 168 hours. Coagulation Profile: No results for input(s): INR, PROTIME in the last 168 hours. Cardiac Enzymes: No results for input(s): CKTOTAL, CKMB, CKMBINDEX, TROPONINI in the last 168 hours. BNP (last 3 results) No results for input(s): PROBNP in the last 8760 hours. HbA1C: No results for input(s): HGBA1C in the last 72 hours. CBG: No results for input(s): GLUCAP in the last 168 hours. Lipid Profile: No results for input(s): CHOL, HDL, LDLCALC, TRIG, CHOLHDL, LDLDIRECT in the last 72 hours. Thyroid Function Tests: No results for input(s): TSH, T4TOTAL, FREET4, T3FREE, THYROIDAB in the last 72 hours. Anemia Panel: No results for input(s): VITAMINB12, FOLATE, FERRITIN, TIBC, IRON, RETICCTPCT in the last 72 hours. Urine  analysis:    Component Value Date/Time   COLORURINE YELLOW 11/24/2019 1426   APPEARANCEUR HAZY (A) 11/24/2019 1426   LABSPEC 1.010 11/24/2019 1426   PHURINE 7.0 11/24/2019 1426   GLUCOSEU NEGATIVE 11/24/2019 1426   HGBUR SMALL (A) 11/24/2019 1426   BILIRUBINUR NEGATIVE 11/24/2019 1426   [place] 11/24/2019 1426   PROTEINUR 30 (A) 11/24/2019 1426   NITRITE NEGATIVE 11/24/2019 1426   LEUKOCYTESUR NEGATIVE 11/24/2019 1426    Radiological Exams on Admission: DG Chest Port 1 View  Result Date: 11/24/2019 CLINICAL DATA:  Fever, cough. EXAM: PORTABLE CHEST 1 VIEW COMPARISON:  February 18, 2019. FINDINGS: The heart size and mediastinal contours are within normal limits. No pneumothorax or pleural effusion is noted. Multiple airspace opacities are noted in both lung bases concerning for multifocal pneumonia. The visualized skeletal structures are unremarkable. IMPRESSION: Multiple bibasilar airspace opacities are noted concerning for multifocal pneumonia. Electronically Signed   By: Marijo Conception M.D.   On: 11/24/2019 14:49    EKG: Independently reviewed.  89 bpm, normal axis, normal intervals, QTc manually 439, sinus rhythm, no ST segment or T wave changes.  Assessment/Plan Principal Problem:   Pneumonia Active Problems:   Pulmonary emphysema (HCC)   GERD (gastroesophageal reflux disease)   Esophageal dysphagia   Anemia, unspecified   Acute hypoxemic respiratory failure ([place])   74 year old male with a past medical history for emphysema who presents with 3 to 4 days of not feeling well, positive generalized malaise, poor appetite and persistent nonproductive cough.  He has been vaccinated for COVID-19.  On his initial physical examination temperature 101.3, blood pressure 110/72, heart rate 89, respiratory rate 16, oxygen saturation 89% on room air.  His lungs had rhonchi and rails, heart S1-S2 present, rhythmic, abdomen soft and nontender, no lower extremity edema. Sodium 130,  potassium 3.7, chloride 96, bicarb 24, glucose 102, BUN 11, creatinine 0.98, AST 55, ALT 23, direct bilirubin is 1.3, white count 9.1, hemoglobin 11.0, hematocrit 34.4, platelets 241.  SARS COVID-19 negative.  Urine analysis specific gravity 1.010, negative for infection.  Head CT negative for acute changes.  Chest radiograph with hyperinflation, bibasilar faint interstitial infiltrates, predominantly on the left.  Patient will be admitted to the hospital with the working diagnosis of community acquired pneumonia, complicated with acute hypoxic respiratory failure and hyponatremia.  1.  Acute hypoxic respiratory failure due to left lower lobe  community-acquired pneumonia.  Patient will be admitted to the medical ward, continue supplemental oxygen per nasal cannula, target oxygen saturation more than 92%. Continue antibiotic therapy with intravenous ceftriaxone and oral azithromycin, add bronchodilator therapy with DuoNeb, antitussive agents with guaifenesin and airway clearing techniques with incentive spirometer and flutter valve.  2.  Acute hyponatremia.  Likely hypovolemic hyponatremia, will continue gentle hydration with isotonic saline at 50 ml per hour, follow-up kidney function and electrolytes in the morning.  3.  Hypertension.  For now will hold antihypertensive agents, due to risk of hypotension.  At home patient on valsartan and amlodipine.  4.  Type 2 diabetes mellitus/dyslipidemia..  Admission glucose 102, will continue glucose coverage and monitoring with insulin sliding scale, hold oral hypoglycemic agents, glimepiride and Metformin.  Continue rosuvastatin.  5.  Chronic pain syndrome/anxiety.  Continue buprenorphine, alprazolam, and gabapentin.  Will request physical therapy and Occupational Therapy evaluation.  6. COPD. No signs of COPD exacerbation.   Status is: Inpatient  Remains inpatient appropriate because:IV treatments appropriate due to intensity of illness or inability  to take PO   Dispo: The patient is from: Home              Anticipated d/c is to: Home              Anticipated d/c date is: 3 days              Patient currently is not medically stable to d/c.    DVT prophylaxis: Enoxaparin   Code Status:   full  Family Communication:  No family at the bedside     Consults called:  None   Admission status:  Inpatient.    Jaclynn Laumann Gerome Apley MD [facility]   11/24/2019, 4:29 PM

## 2019-11-24 NOTE — ED Provider Notes (Signed)
[facility] EMERGENCY DEPARTMENT Provider Note   CSN: [id number] Arrival date & time: 11/24/19  1210     History Chief Complaint  Patient presents with  . Altered Mental Status    Travis Beck is a 74 y.o. male with a past medical history of COPD, asthma, diabetes, hypertension, emphysema, who presents today for evaluation of difficulty sleeping.  When I ask him when his symptoms started he states "I don't know."  On further coaching when asking if his symptoms have been present for days, weeks, or months, he tells me that his symptoms started yesterday.  He reports burning when he urinates since yesterday.  He denies any pain or shortness of breath.  He does not require oxygen normally.  He does have a CPAP that he uses at night.  He was unaware that he was febrile.  Level 5 caveat for confusion  HPI     Past Medical History:  Diagnosis Date  . Arthritis   . Asthma    Childhood  . Diabetes ([place])   . Hypertension     Patient Active Problem List   Diagnosis Date Noted  . Pneumonia 11/24/2019  . Acute hypoxemic respiratory failure ([place]) 11/24/2019  . Esophageal dysphagia 05/06/2019  . Anemia, unspecified 05/06/2019  . Dysphagia 05/06/2019  . Hypoxemia   . COPD exacerbation ([place]) 01/25/2019  . GERD (gastroesophageal reflux disease) 01/24/2019  . Hematemesis without nausea 08/20/2018  . Melena 08/20/2018  . Chronic bronchitis with emphysema ([place]) 05/07/2018  . Abnormal CT scan of lung 01/22/2018  . Pulmonary emphysema ([place]) 01/22/2018    Past Surgical History:  Procedure Laterality Date  . BIOPSY  08/30/2018   Procedure: BIOPSY;  Surgeon: Rogene Houston, MD;  Location: [facility] END[facility];  Service: Endoscopy;;  gastric   . COLONOSCOPY    . ESOPHAGEAL DILATION N/A 05/21/2019   Procedure: ESOPHAGEAL DILATION;  Surgeon: Rogene Houston, MD;  Location: [facility] END[facility] SU[facility];  Service: Endoscopy;  Laterality: N/A;  . ESOPHAGOGASTRODUODENOSCOPY N/A 05/21/2019   Procedure: ESOPHAGOGASTRODUODENOSCOPY (EGD);  Surgeon: Rogene Houston, MD;  Location: [facility];  Service: Endoscopy;  Laterality: N/A;  730  . ESOPHAGOGASTRODUODENOSCOPY (EGD) WITH PROPOFOL N/A 08/30/2018   Procedure: ESOPHAGOGASTRODUODENOSCOPY (EGD) WITH PROPOFOL;  Surgeon: Rogene Houston, MD;  Location: [facility];  Service: Endoscopy;  Laterality: N/A;  1:30  . HERNIA REPAIR     bilateral lower abdomen       History reviewed. No pertinent family history.  Social History   Tobacco Use  . Smoking status: Former Smoker    Packs/day: 2.00    Years: 33.00    Pack years: 66.00    Types: Cigarettes    Quit date: 01/22/1993    Years since quitting: 26.8  . Smokeless tobacco: Never Used  Substance Use Topics  . Alcohol use: Not Currently  . Drug use: Never    Home Medications Prior to Admission medications   Medication Sig Start Date End Date Taking? Authorizing Provider  albuterol (VENTOLIN HFA) 108 (90 Base) MCG/ACT inhaler Inhale 1-2 puffs into the lungs every 4 (four) hours as needed.   Yes [provider]  ALPRAZolam Duanne Moron) 1 MG tablet Take 1 tablet (1 mg total) by mouth 3 (three) times daily as needed for anxiety. 01/28/19  Yes Welborn, Ryan, DO  amLODipine (NORVASC) 5 MG tablet TAKE 1 TABLET BY MOUTH EVERY DAY Patient taking differently: Take 5 mg by mouth daily.  10/21/19  Yes Herminio Commons, MD  buprenorphine (SUBUTEX) 8 MG SUBL SL tablet Place 8 mg under the tongue daily.  01/04/19  Yes [provider]  fluticasone (FLONASE) 50 MCG/ACT nasal spray Place 2 sprays into both nostrils daily.  08/22/18  Yes [provider]  gabapentin (NEURONTIN) 400 MG capsule Take 1 capsule (400 mg total) by mouth every 8 (eight) hours as needed. 01/28/19  Yes Welborn, Ryan, DO  glyBURIDE (DIABETA) 5 MG tablet Take 10 mg by mouth 2 (two) times daily with a meal.  11/19/14  Yes [provider]  ipratropium-albuterol (DUONEB) 0.5-2.5 (3) MG/3ML SOLN  Inhale 3 mLs into the lungs every 6 (six) hours as needed.   Yes [provider]  levocetirizine (XYZAL) 5 MG tablet Take 1 tablet by mouth daily. 07/23/19  Yes [provider]  metFORMIN (GLUCOPHAGE) 1000 MG tablet Take 1,000 mg by mouth 2 (two) times daily with a meal.  12/07/14  Yes [provider]  pantoprazole (PROTONIX) 40 MG tablet TAKE 1 TABLET BY MOUTH TWICE DAILY BEFORE A MEAL Patient taking differently: Take 40 mg by mouth 2 (two) times daily.  09/03/19  Yes Laurine Blazer A, PA-C  rosuvastatin (CRESTOR) 20 MG tablet TAKE 1 TABLET BY MOUTH EVERY DAY Patient taking differently: Take 20 mg by mouth daily.  10/21/19  Yes Herminio Commons, MD  TRELEGY ELLIPTA 100-62.5-25 MCG/INH AEPB inhale ONE PUFF into THE lungs DAILY Patient taking differently: Inhale 1 puff into the lungs daily.  10/29/19  Yes Mannam, Praveen, MD  triamcinolone cream (KENALOG) 0.1 % Apply 1 application topically daily as needed. 11/05/19  Yes [provider]  valsartan (DIOVAN) 80 MG tablet Take 80 mg by mouth daily. 01/22/19  Yes [provider]    Allergies    Penicillins  Review of Systems   Review of Systems  Unable to perform ROS: Mental status change   Confusion  Physical Exam Updated Vital Signs BP 131/67 (BP Location: Left Arm)   Pulse 82   Temp 98.9 F (37.2 C)   Resp 18   Ht 5\' 5"  (1.651 m)   Wt 71 kg   SpO2 98%   BMI 26.05 kg/m   Physical Exam Vitals and nursing note reviewed.  Constitutional:      Appearance: He is well-developed. He is ill-appearing. He is not diaphoretic.  HENT:     Head: Normocephalic and atraumatic.     Mouth/Throat:     Mouth: Mucous membranes are moist.  Eyes:     General: No scleral icterus.       Right eye: No discharge.        Left eye: No discharge.     Conjunctiva/sclera: Conjunctivae normal.  Cardiovascular:     Rate and Rhythm: Normal rate and regular rhythm.     Pulses: Normal pulses.     Heart sounds:  Normal heart sounds.  Pulmonary:     Effort: Pulmonary effort is normal. No respiratory distress.     Breath sounds: No stridor. Rhonchi (Diffuse, bilaterally. ) present.  Abdominal:     General: There is no distension.  Musculoskeletal:        General: No deformity.     Cervical back: Normal range of motion and neck supple.     Right lower leg: No edema.     Left lower leg: No edema.  Skin:    General: Skin is warm and dry.  Neurological:     Mental Status: He is alert.     Motor: No  abnormal muscle tone.     Comments: Patient is alert, oriented to person and place.  He is not oriented to time.  He knows it is 2021 however thinks it is April and does not know the day of the week.  He is overall globally, unable to sit up without assistance.  Psychiatric:        Mood and Affect: Mood normal.        Behavior: Behavior normal.     ED Results / Procedures / Treatments   Labs (all labs ordered are listed, but only abnormal results are displayed) Labs Reviewed  COMPREHENSIVE METABOLIC PANEL - Abnormal; Notable for the following components:      Result Value   Sodium 130 (*)    Chloride 96 (*)    Glucose, Bld 102 (*)    Calcium 8.5 (*)    AST 55 (*)    Total Bilirubin 1.3 (*)    All other components within normal limits  CBC WITH DIFFERENTIAL/PLATELET - Abnormal; Notable for the following components:   RBC 3.86 (*)    Hemoglobin 11.0 (*)    HCT 34.4 (*)    All other components within normal limits  URINALYSIS, ROUTINE W REFLEX MICROSCOPIC - Abnormal; Notable for the following components:   APPearance HAZY (*)    Hgb urine dipstick SMALL (*)    Protein, ur 30 (*)    All other components within normal limits  BRAIN NATRIURETIC PEPTIDE - Abnormal; Notable for the following components:   B Natriuretic Peptide 211.0 (*)    All other components within normal limits  SARS CORONAVIRUS 2 BY RT PCR (HOSPITAL ORDER, [facility])  CULTURE, BLOOD (ROUTINE X 2)   MRSA PCR SCREENING  CULTURE, BLOOD (ROUTINE X 2)  URINE CULTURE  LACTIC ACID, PLASMA  LACTIC ACID, PLASMA  CBC  CREATININE, SERUM  BASIC METABOLIC PANEL  CBC    EKG EKG Interpretation  Date/Time:  Monday Nov 24 2019 12:30:56 EDT Ventricular Rate:  89 PR Interval:    QRS Duration: 105 QT Interval:  421 QTC Calculation: 513 R Axis:   15 Text Interpretation: Sinus rhythm Low voltage, precordial leads Prolonged QT interval Baseline wander in lead(s) V3 Since last tracing QT has lengthened Otherwise no significant change Confirmed by Daleen Bo [phone]) on 11/24/2019 1:53:57 PM   Radiology CT Head Wo Contrast  Result Date: 11/24/2019 CLINICAL DATA:  Difficulty sleeping EXAM: CT HEAD WITHOUT CONTRAST TECHNIQUE: Contiguous axial images were obtained from the base of the skull through the vertex without intravenous contrast. COMPARISON:  CT 12/18/2018, MR 12/10/2018 FINDINGS: Brain: Unchanged hypoattenuating foci in the external capsule may reflect remote lacunar infarct or prominent perivascular spaces. No evidence of acute infarction, hemorrhage, hydrocephalus, extra-axial collection or mass lesion/mass effect. Symmetric prominence of the ventricles, cisterns and sulci compatible with parenchymal volume loss. Patchy areas of white matter hypoattenuation are most compatible with chronic microvascular angiopathy. Vascular: Atherosclerotic calcification of the carotid siphons and intradural vertebral arteries. No hyperdense vessel. Skull: No calvarial fracture or suspicious osseous lesion. No scalp swelling or hematoma. Sinuses/Orbits: Minimal thickening in the ethmoids. Remaining paranasal sinuses and mastoid air cells are predominantly clear. Included orbital structures are unremarkable. Other: None IMPRESSION: No acute intracranial abnormality. Stable hypoattenuating foci in the external capsules, likely remote lacunar infarct versus perivascular space. Chronic microvascular angiopathy and  parenchymal volume loss Electronically Signed   By: Lovena Le M.D.   On: 11/24/2019 16:49   DG Chest [facility]  1 View  Result Date: 11/24/2019 CLINICAL DATA:  Fever, cough. EXAM: PORTABLE CHEST 1 VIEW COMPARISON:  February 18, 2019. FINDINGS: The heart size and mediastinal contours are within normal limits. No pneumothorax or pleural effusion is noted. Multiple airspace opacities are noted in both lung bases concerning for multifocal pneumonia. The visualized skeletal structures are unremarkable. IMPRESSION: Multiple bibasilar airspace opacities are noted concerning for multifocal pneumonia. Electronically Signed   By: Marijo Conception M.D.   On: 11/24/2019 14:49    Procedures .Critical Care Performed by: Lorin Glass, PA-C Authorized by: Lorin Glass, PA-C   Critical care provider statement:    Critical care time (minutes):  45   Critical care was time spent personally by me on the following activities:  Discussions with consultants, evaluation of patient's response to treatment, examination of patient, ordering and performing treatments and interventions, ordering and review of laboratory studies, ordering and review of radiographic studies, pulse oximetry, re-evaluation of patient's condition, obtaining history from patient or surrogate and review of old charts   (including critical care time)  Medications Ordered in ED Medications  acetaminophen (TYLENOL) tablet 650 mg (650 mg Oral Given 11/24/19 1458)  metroNIDAZOLE (FLAGYL) IVPB 500 mg (0 mg Intravenous Stopped 11/24/19 1822)  ceFEPIme (MAXIPIME) 2 g in sodium chloride 0.9 % 100 mL IVPB (0 g Intravenous Stopped 11/24/19 1822)  vancomycin (VANCOREADY) IVPB 1500 mg/300 mL (0 mg Intravenous Stopped 11/24/19 1822)  albuterol (VENTOLIN HFA) 108 (90 Base) MCG/ACT inhaler 2 puff (2 puffs Inhalation Given 11/24/19 1838)  AeroChamber Plus Flo-Vu Medium MISC 1 each (1 each Other Given 11/24/19 2002)    ED Course  I have reviewed the  triage vital signs and the nursing notes.  Pertinent labs & imaging results that were available during my care of the patient were reviewed by me and considered in my medical decision making (see chart for details).  Clinical Course as of Nov 23 2209  Mon Nov 24, 2019  1627 I spoke with the hospitalist who will see the patient for admission.    [EH]    Clinical Course User Index [EH] Ollen Gross   MDM Rules/Calculators/A&P                     Patient is a 74 year old man who presents today for evaluation of difficulty sleeping and concentrating.  On my evaluation he appears confused.  He is oriented to person and place however not time. Here in the emergency room he is acutely hypoxic, he does not normally require oxygen however was at 84% on room air.  In addition he is febrile.  He does not have significant leukocytosis, however he is febrile and intermittently tachypneic with hypoxia and chest x-ray showing concern for multifocal pneumonia therefore he is treated with broad-spectrum sepsis antibiotics.  He was not given 30/kg fluid bolus as his lactic was under four and he was not hypotensive.  While he does have concern for multifocal pneumonia he did report dysuria and his urine has small hemoglobin.  While this was a catheterized specimen we will keep broad antibiotics for now to cover possible UTI in addition.  BNP is not significantly elevated, Covid test is negative.  Urine culture and blood cultures were sent.  CT head was obtained without evidence of acute abnormality.  Patient will be admitted to the hospital.  I spoke with the hospitalist will see patient for admission.  Note: Portions of this report may have been  transcribed using voice recognition software. Every effort was made to ensure accuracy; however, inadvertent computerized transcription errors may be present   Final Clinical Impression(s) / ED Diagnoses Final diagnoses:  Altered mental status,  unspecified altered mental status type  Acute respiratory failure with hypoxia ([place])  Pneumonia of both lungs due to infectious organism, unspecified part of lung    Rx / DC Orders ED Discharge Orders    None       Ollen Gross 11/24/19 2214    Margette Fast, MD 11/24/19 2342

## 2019-11-24 NOTE — Progress Notes (Signed)
Pharmacy Antibiotic Note  Travis Beck is a 74 y.o. male admitted on 11/24/2019 with unknown source.  Pharmacy has been consulted for Vancomycin and cefepime dosing.  Plan: Vancomycin 1500mg  loading dose, then 750mg  IV every 12 hours.  Goal trough 15-20 mcg/mL.  Cefepime 2gm IV q12h F/U cxs and clinical progress Monitor V/S, labs, and levels as indicated  Height: 5\' 5"  (165.1 cm) Weight: 72.6 kg (160 lb) IBW/kg (Calculated) : 61.5  Temp (24hrs), Avg:101.7 F (38.7 C), Min:101.3 F (38.5 C), Max:102.4 F (39.1 C)  Recent Labs  Lab 11/24/19 1235 11/24/19 1502  WBC 9.1  --   CREATININE 0.98  --   LATICACIDVEN 1.4 0.9    Estimated Creatinine Clearance: 56.7 mL/min (by C-G formula based on SCr of 0.98 mg/dL).    Allergies  Allergen Reactions  . Penicillins Rash    No problems with ampicillin during hospitalization    Antimicrobials this admission: Vancomycin 5/17 >> Cefepime 5/17 >>   Dose adjustments this admission: prn  Microbiology results: 5/17 BCx: pending 5/17 UCx: TBC  MRSA PCR:  Thank you for allowing pharmacy to be a part of this patient's care. Isac Sarna, BS [place], [place] Clinical Pharmacist Pager [phone] 11/24/2019 5:04 PM

## 2019-11-25 DIAGNOSIS — J9601 Acute respiratory failure with hypoxia: Secondary | ICD-10-CM

## 2019-11-25 LAB — URINE CULTURE: Culture: NO GROWTH

## 2019-11-25 LAB — BASIC METABOLIC PANEL
Anion gap: 9 (ref 5–15)
BUN: 9 mg/dL (ref 8–23)
CO2: 24 mmol/L (ref 22–32)
Calcium: 7.9 mg/dL — ABNORMAL LOW (ref 8.9–10.3)
Chloride: 98 mmol/L (ref 98–111)
Creatinine, Ser: 0.82 mg/dL (ref 0.61–1.24)
GFR calc Af Amer: >60 mL/min (ref >60)
GFR calc non Af Amer: >60 mL/min (ref >60)
Glucose, Bld: 147 mg/dL — ABNORMAL HIGH (ref 70–99)
Potassium: 3.3 mmol/L — ABNORMAL LOW (ref 3.5–5.1)
Sodium: 131 mmol/L — ABNORMAL LOW (ref 135–145)

## 2019-11-25 LAB — CBC
HCT: 29.9 % — ABNORMAL LOW (ref 39.0–52.0)
Hemoglobin: 9.6 g/dL — ABNORMAL LOW (ref 13.0–17.0)
MCH: 28.2 pg (ref 26.0–34.0)
MCHC: 32.1 g/dL (ref 30.0–36.0)
MCV: 87.9 fL (ref 80.0–100.0)
Platelets: 206 10*3/uL (ref 150–400)
RBC: 3.4 MIL/uL — ABNORMAL LOW (ref 4.22–5.81)
RDW: 13.8 % (ref 11.5–15.5)
WBC: 6.8 10*3/uL (ref 4.0–10.5)
nRBC: 0 % (ref 0.0–0.2)

## 2019-11-25 MED ORDER — GUAIFENESIN ER 600 MG PO TB12
600.0000 mg | ORAL_TABLET | Freq: Two times a day (BID) | ORAL | Status: DC
  Administered 2019-11-25 – 2019-11-26 (×2): 600 mg via ORAL
  Filled 2019-11-25 (×2): qty 1

## 2019-11-25 MED ORDER — IPRATROPIUM-ALBUTEROL 0.5-2.5 (3) MG/3ML IN SOLN
3.0000 mL | Freq: Three times a day (TID) | RESPIRATORY_TRACT | Status: DC
  Administered 2019-11-26 (×2): 3 mL via RESPIRATORY_TRACT
  Filled 2019-11-25 (×2): qty 3

## 2019-11-25 MED ORDER — IPRATROPIUM-ALBUTEROL 0.5-2.5 (3) MG/3ML IN SOLN
3.0000 mL | Freq: Four times a day (QID) | RESPIRATORY_TRACT | Status: DC
  Administered 2019-11-25: 3 mL via RESPIRATORY_TRACT
  Filled 2019-11-25: qty 3

## 2019-11-25 NOTE — Evaluation (Signed)
Physical Therapy Evaluation Patient Details Name: Travis Beck MRN: [id number] DOB: [birth year] Today's Date: 11/25/2019   History of Present Illness  74 y.o. male with medical history significant of arthritis, childhood asthma, type 2 diabetes mellitus and hypertension.  Patient reports 3 to 4 days of not feeling well, generalized malaise, difficulty sleeping and poor appetite.  He reports coughing, moderate in intensity, persistent for the last 4 days, tends to be dry, no improving or worsening factors.  Clinical Impression  Physical therapy evaluation completed, patient is at baseline and no further PT services recommended at this time. Pt able to safely navigate room and sloped surfaces without AD and no loss of balance. On 3L O2 upon arrival SpO2 100%, placed on RA with SpO2 94-96% with ambulation; RN notified. Patient discharged to care of nursing for ambulation daily as tolerated for length of stay.     Follow Up Recommendations No PT follow up    Equipment Recommendations  None recommended by PT    Recommendations for Other Services       Precautions / Restrictions Precautions Precautions: None Restrictions Weight Bearing Restrictions: No      Mobility  Bed Mobility Overal bed mobility: Modified Independent  General bed mobility comments: per OT pt is modified independent; up in recliner upon arrival  Transfers Overall transfer level: Modified independent Equipment used: None   Ambulation/Gait Ambulation/Gait assistance: Independent Gait Distance (Feet): 150 Feet Assistive device: None Gait Pattern/deviations: WFL(Within Functional Limits) Gait velocity: WNL   General Gait Details: pt able to ambulate around room, ascend/descend sloped surfaces, complete turns without difficulty or loss of balance, good bil foot clearance, therapist only managing IV pole  Stairs            Wheelchair Mobility    Modified Rankin (Stroke Patients Only)        Balance Overall balance assessment: Modified Independent              Pertinent Vitals/Pain Pain Assessment: No/denies pain    Home Living Family/patient expects to be discharged to:: Private residence Living Arrangements: Other relatives(brother) Available Help at Discharge: Family;Available PRN/intermittently Type of Home: House Home Access: Ramped entrance     Home Layout: One level Home Equipment: Walker - 2 wheels;Cane - single point;Shower seat;Grab bars - tub/shower;Grab bars - toilet      Prior Function Level of Independence: Independent         Comments: Independent in mobility and ADLs, driving, caring for chickens, and exercises daily     Hand Dominance   Dominant Hand: Right    Extremity/Trunk Assessment   Upper Extremity Assessment Upper Extremity Assessment: Defer to OT evaluation    Lower Extremity Assessment Lower Extremity Assessment: Overall WFL for tasks assessed(BLE AROM WNL, strength 4+/5)    Cervical / Trunk Assessment Cervical / Trunk Assessment: Normal  Communication   Communication: No difficulties  Cognition Arousal/Alertness: Awake/alert Behavior During Therapy: WFL for tasks assessed/performed Overall Cognitive Status: Within Functional Limits for tasks assessed       General Comments General comments (skin integrity, edema, etc.): Pt on RA with SpO2 94-96%, no increased work of breathing, denies dizziness or SOB    Exercises     Assessment/Plan    PT Assessment Patent does not need any further PT services  PT Problem List         PT Treatment Interventions      PT Goals (Current goals can be found in the Care Plan section)  Acute Rehab  PT Goals Patient Stated Goal: get coffee and get home PT Goal Formulation: With patient Time For Goal Achievement: 11/25/19 Potential to Achieve Goals: Good    Frequency     Barriers to discharge        Co-evaluation               AM-PAC PT "6 Clicks" Mobility   Outcome Measure Help needed turning from your back to your side while in a flat bed without using bedrails?: None Help needed moving from lying on your back to sitting on the side of a flat bed without using bedrails?: None Help needed moving to and from a bed to a chair (including a wheelchair)?: None Help needed standing up from a chair using your arms (e.g., wheelchair or bedside chair)?: None Help needed to walk in hospital room?: None Help needed climbing 3-5 steps with a railing? : None 6 Click Score: 24    End of Session   Activity Tolerance: Patient tolerated treatment well Patient left: in chair;with call bell/phone within reach Nurse Communication: Mobility status;Other (comment)(Oxygen removal due to >92% on RA) PT Visit Diagnosis: Other abnormalities of gait and mobility (R26.89)    Time: [id number] PT Time Calculation (min) (ACUTE ONLY): 11 min   Charges:   PT Evaluation $PT Eval Low Complexity: 1 Low           Tori Jahmiyah Dullea PT, DPT 11/25/19, 8:59 AM

## 2019-11-25 NOTE — Progress Notes (Signed)
Patient Demographics:    Travis Beck, is a 74 y.o. male, DOB - 10-Mar-1946, [id number]  Admit date - 11/24/2019   Admitting Physician Mauricio Gerome Apley, MD  Outpatient Primary MD for the patient is Monico Blitz, MD  LOS - 1  Chief Complaint  Patient presents with  . Altered Mental Status        Subjective:    Travis Beck today has no fevers, no emesis,  No chest pain,   shob at rest resolved,  DOE improving and Hypoxia resolving -Cough persist  Assessment  & Plan :    Principal Problem:   Pneumonia Active Problems:   Pulmonary emphysema (HCC)   GERD (gastroesophageal reflux disease)   Esophageal dysphagia   Anemia, unspecified   Acute hypoxemic respiratory failure (HCC)  Brief Summary:- 74 y.o. male with medical history significant of arthritis, childhood asthma, type 2 diabetes mellitus and hypertension admitted on 11/24/19 with acute hypoxic respiratory failure secondary to multifocal pneumonia   A/p 1)Multifocal Pneumonia--he previously completed Covid vaccination -shob at rest resolved,  DOE improving and Hypoxia resolving -Cough persist -Wean off oxygen completely -Tinea Rocephin and azithromycin along with mucolytics and bronchodilators  2)Acute Hypoxic Resp Failure--resolving, management as above #1  3)DM2-hold PTA glimepiride and Metformin, Use Novolog/Humalog Sliding scale insulin with Accu-Cheks/Fingersticks as ordered   4)HTN--Losartan and Amlodipine currently on hold  5)-Chronic pain syndrome and anxiety--stable, continue Xanax, gabapentin and buprenorphine  6)Hyponatremia--- most likely related to volume depletion, cannot exclude some component of SIADH in the patient with pneumonia  Disposition/Need for in-Hospital Stay- patient unable to be discharged at this time due to --multifocal pneumonia with hypoxia requiring IV antibiotics -Patient From: home D/C  Place: home Barriers: Not Clinically Stable-   Code Status : full   Family Communication:   NA (patient is alert, awake and coherent)  Consults  :  Na   DVT Prophylaxis  :  Lovenox  SCDs   Lab Results  Component Value Date   PLT 206 11/25/2019    Inpatient Medications  Scheduled Meds: . azithromycin  500 mg Oral Daily  . buprenorphine-naloxone  1 tablet Sublingual Daily  . enoxaparin (LOVENOX) injection  40 mg Subcutaneous Q24H  . ipratropium-albuterol  3 mL Nebulization Q6H  . pantoprazole  40 mg Oral BID  . polyethylene glycol  17 g Oral BID  . rosuvastatin  20 mg Oral q1800   Continuous Infusions: . sodium chloride Stopped (11/25/19 0430)  . cefTRIAXone (ROCEPHIN)  IV 200 mL/hr at 11/25/19 0500   PRN Meds:.acetaminophen **OR** acetaminophen, ALPRAZolam, gabapentin, guaiFENesin-dextromethorphan, ipratropium-albuterol, ondansetron **OR** ondansetron (ZOFRAN) IV    Anti-infectives (From admission, onward)   Start     Dose/Rate Route Frequency Ordered Stop   11/25/19 0500  vancomycin (VANCOREADY) IVPB 750 mg/150 mL  Status:  Discontinued     750 mg 150 mL/hr over 60 Minutes Intravenous Every 12 hours 11/24/19 1711 11/24/19 2021   11/25/19 0400  ceFEPIme (MAXIPIME) 2 g in sodium chloride 0.9 % 100 mL IVPB  Status:  Discontinued     2 g 200 mL/hr over 30 Minutes Intravenous Every 12 hours 11/24/19 1711 11/24/19 2021   11/25/19 0400  cefTRIAXone (ROCEPHIN) 1 g in sodium chloride 0.9 % 100 mL IVPB  1 g 200 mL/hr over 30 Minutes Intravenous Every 24 hours 11/24/19 2021     11/24/19 2200  azithromycin (ZITHROMAX) tablet 500 mg     500 mg Oral Daily 11/24/19 2021     11/24/19 1700  vancomycin (VANCOREADY) IVPB 1500 mg/300 mL     1,500 mg 150 mL/hr over 120 Minutes Intravenous  Once 11/24/19 1541 11/24/19 1822   11/24/19 1545  ceFEPIme (MAXIPIME) 2 g in sodium chloride 0.9 % 100 mL IVPB     2 g 200 mL/hr over 30 Minutes Intravenous  Once 11/24/19 1540 11/24/19 1822    11/24/19 1530  aztreonam (AZACTAM) 2 g in sodium chloride 0.9 % 100 mL IVPB  Status:  Discontinued     2 g 200 mL/hr over 30 Minutes Intravenous  Once 11/24/19 1528 11/24/19 1540   11/24/19 1530  metroNIDAZOLE (FLAGYL) IVPB 500 mg     500 mg 100 mL/hr over 60 Minutes Intravenous  Once 11/24/19 1528 11/24/19 1822   11/24/19 1530  vancomycin (VANCOCIN) IVPB 1000 mg/200 mL premix  Status:  Discontinued     1,000 mg 200 mL/hr over 60 Minutes Intravenous  Once 11/24/19 1528 11/24/19 1541        Objective:   Vitals:   11/25/19 0801 11/25/19 0819 11/25/19 1151 11/25/19 1504  BP: (!) 147/82   (!) 156/78  Pulse: 84   91  Resp: 17   18  Temp: 97.7 F (36.5 C)   98.6 F (37 C)  TempSrc: Oral   Oral  SpO2: 100% 97% 95% 98%  Weight:      Height:        Wt Readings from Last 3 Encounters:  11/24/19 71 kg  09/05/19 74.2 kg  08/06/19 72.6 kg     Intake/Output Summary (Last 24 hours) at 11/25/2019 1807 Last data filed at 11/25/2019 0700 Gross per 24 hour  Intake 447.48 ml  Output 500 ml  Net -52.52 ml    Physical Exam  Gen:- Awake Alert,  In no apparent distress  HEENT:- [place].AT, No sclera icterus, visual acuity loss Nose- [place] 2L/min Neck-Supple Neck,No JVD,.  Lungs-improving air movement, scattered rhonchi noted, no wheezing  CV- S1, S2 normal, regular  Abd-  +ve B.Sounds, Abd Soft, No tenderness,    Extremity/Skin:- No  edema, pedal pulses present Psych-affect is appropriate, oriented x3 Neuro-no new focal deficits, no tremors   Data Review:   Micro Results Recent Results (from the past 240 hour(s))  Culture, blood (routine x 2)     Status: None (Preliminary result)   Collection Time: 11/24/19 12:35 PM   Specimen: BLOOD  Result Value Ref Range Status   Specimen Description BLOOD RIGHT ANTECUBITAL DRAWN BY RN LL  Final   Special Requests   Final    BOTTLES DRAWN AEROBIC AND ANAEROBIC Blood Culture adequate volume   Culture   Final    NO GROWTH < 24 HOURS Performed at  [facility], [address], [place],  [address]    Report Status PENDING  Incomplete  SARS Coronavirus 2 by RT PCR (hospital order, performed in [facility] hospital lab) Nasopharyngeal Urine, Catheterized     Status: None   Collection Time: 11/24/19  2:25 PM   Specimen: Urine, Catheterized; Nasopharyngeal  Result Value Ref Range Status   SARS Coronavirus 2 NEGATIVE NEGATIVE Final    Comment: (NOTE) SARS-CoV-2 target nucleic acids are NOT DETECTED. The SARS-CoV-2 RNA is generally detectable in upper and lower respiratory specimens during the acute  phase of infection. The lowest concentration of SARS-CoV-2 viral copies this assay can detect is 250 copies / mL. A negative result does not preclude SARS-CoV-2 infection and should not be used as the sole basis for treatment or other patient management decisions.  A negative result may occur with improper specimen collection / handling, submission of specimen other than nasopharyngeal swab, presence of viral mutation(s) within the areas targeted by this assay, and inadequate number of viral copies (<250 copies / mL). A negative result must be combined with clinical observations, patient history, and epidemiological information. Fact Sheet for Patients:   StrictlyIdeas.no Fact Sheet for Healthcare Providers: BankingDealers.co.za This test is not yet approved or cleared  by the [place] FDA and has been authorized for detection and/or diagnosis of SARS-CoV-2 by FDA under an Emergency Use Authorization (EUA).  This EUA will remain in effect (meaning this test can be used) for the duration of the COVID-19 declaration under Section 564(b)(1) of the Act, 21 U.S.C. section 360bbb-3(b)(1), unless the authorization is terminated or revoked sooner. Performed at [facility], [address] [place], [place] [address]   Urine culture     Status: None   Collection Time: 11/24/19  2:26 PM    Specimen: Urine, Clean Catch  Result Value Ref Range Status   Specimen Description   Final    URINE, CLEAN CATCH Performed at [facility], [address], [place], [place] [address]    Special Requests   Final    NONE Performed at [facility], [address], [place], [place] [address]    Culture   Final    NO GROWTH Performed at [facility], [place] [address] [place], [place] [address]    Report Status 11/25/2019 FINAL  Final  Culture, blood (routine x 2)     Status: None (Preliminary result)   Collection Time: 11/24/19  3:02 PM   Specimen: Left Antecubital; Blood  Result Value Ref Range Status   Specimen Description LEFT ANTECUBITAL  Final   Special Requests   Final    BOTTLES DRAWN AEROBIC AND ANAEROBIC Blood Culture adequate volume   Culture   Final    NO GROWTH < 24 HOURS Performed at [facility], [address], [place], [place] [address]    Report Status PENDING  Incomplete  MRSA PCR Screening     Status: None   Collection Time: 11/24/19  4:57 PM   Specimen: Nasal Mucosa; Nasopharyngeal  Result Value Ref Range Status   MRSA by PCR NEGATIVE NEGATIVE Final    Comment:        The GeneXpert MRSA Assay (FDA approved for NASAL specimens only), is one component of a comprehensive MRSA colonization surveillance program. It is not intended to diagnose MRSA infection nor to guide or monitor treatment for MRSA infections. Performed at [facility], [address], [place], [place] [address]     Radiology Reports CT Head Wo Contrast  Result Date: 11/24/2019 CLINICAL DATA:  Difficulty sleeping EXAM: CT HEAD WITHOUT CONTRAST TECHNIQUE: Contiguous axial images were obtained from the base of the skull through the vertex without intravenous contrast. COMPARISON:  CT 12/18/2018, MR 12/10/2018 FINDINGS: Brain: Unchanged hypoattenuating foci in the external capsule may reflect remote lacunar infarct or prominent perivascular spaces. No evidence of acute infarction, hemorrhage,  hydrocephalus, extra-axial collection or mass lesion/mass effect. Symmetric prominence of the ventricles, cisterns and sulci compatible with parenchymal volume loss. Patchy areas of white matter hypoattenuation are most compatible with chronic microvascular angiopathy.  Vascular: Atherosclerotic calcification of the carotid siphons and intradural vertebral arteries. No hyperdense vessel. Skull: No calvarial fracture or suspicious osseous lesion. No scalp swelling or hematoma. Sinuses/Orbits: Minimal thickening in the ethmoids. Remaining paranasal sinuses and mastoid air cells are predominantly clear. Included orbital structures are unremarkable. Other: None IMPRESSION: No acute intracranial abnormality. Stable hypoattenuating foci in the external capsules, likely remote lacunar infarct versus perivascular space. Chronic microvascular angiopathy and parenchymal volume loss Electronically Signed   By: Lovena Le M.D.   On: 11/24/2019 16:49   DG Chest Port 1 View  Result Date: 11/24/2019 CLINICAL DATA:  Fever, cough. EXAM: PORTABLE CHEST 1 VIEW COMPARISON:  February 18, 2019. FINDINGS: The heart size and mediastinal contours are within normal limits. No pneumothorax or pleural effusion is noted. Multiple airspace opacities are noted in both lung bases concerning for multifocal pneumonia. The visualized skeletal structures are unremarkable. IMPRESSION: Multiple bibasilar airspace opacities are noted concerning for multifocal pneumonia. Electronically Signed   By: Marijo Conception M.D.   On: 11/24/2019 14:49     CBC Recent Labs  Lab 11/24/19 1235 11/25/19 0532  WBC 9.1 6.8  HGB 11.0* 9.6*  HCT 34.4* 29.9*  PLT 241 206  MCV 89.1 87.9  MCH 28.5 28.2  MCHC 32.0 32.1  RDW 14.0 13.8  LYMPHSABS 0.7  --   MONOABS 0.5  --   EOSABS 0.1  --   BASOSABS 0.0  --     Chemistries  Recent Labs  Lab 11/24/19 1235 11/25/19 0532  NA 130* 131*  K 3.7 3.3*  CL 96* 98  CO2 24 24  GLUCOSE 102* 147*  BUN 11 9    CREATININE 0.98 0.82  CALCIUM 8.5* 7.9*  AST 55*  --   ALT 23  --   ALKPHOS 50  --   BILITOT 1.3*  --    ------------------------------------------------------------------------------------------------------------------ No results for input(s): CHOL, HDL, LDLCALC, TRIG, CHOLHDL, LDLDIRECT in the last 72 hours.  No results found for: HGBA1C ------------------------------------------------------------------------------------------------------------------ No results for input(s): TSH, T4TOTAL, T3FREE, THYROIDAB in the last 72 hours.  Invalid input(s): FREET3 ------------------------------------------------------------------------------------------------------------------ No results for input(s): VITAMINB12, FOLATE, FERRITIN, TIBC, IRON, RETICCTPCT in the last 72 hours.  Coagulation profile No results for input(s): INR, PROTIME in the last 168 hours.  No results for input(s): DDIMER in the last 72 hours.  Cardiac Enzymes No results for input(s): CKMB, TROPONINI, MYOGLOBIN in the last 168 hours.  Invalid input(s): CK ------------------------------------------------------------------------------------------------------------------    Component Value Date/Time   BNP 211.0 (H) 11/24/2019 1235     Roxan Hockey M.D on 11/25/2019 at 6:07 PM  Go to [email or web address] - for contact info  [facility] - Office  [phone]

## 2019-11-25 NOTE — Evaluation (Signed)
Occupational Therapy Evaluation Patient Details Name: Jerad Gardipee MRN: [id number] DOB: 05/28/1946 Today's Date: 11/25/2019    History of Present Illness 74 y.o. male with medical history significant of arthritis, childhood asthma, type 2 diabetes mellitus and hypertension.  Patient reports 3 to 4 days of not feeling well, generalized malaise, difficulty sleeping and poor appetite.  He reports coughing, moderate in intensity, persistent for the last 4 days, tends to be dry, no improving or worsening factors.   Clinical Impression   Pt agreeable to OT evaluation this am. Pt performing ADLs and functional mobility tasks at modified independent level, occasional supervision for managing IV line. No further OT services required at this time.     Follow Up Recommendations  No OT follow up    Equipment Recommendations  None recommended by OT       Precautions / Restrictions Precautions Precautions: None Restrictions Weight Bearing Restrictions: No      Mobility Bed Mobility Overal bed mobility: Modified Independent                Transfers Overall transfer level: Modified independent Equipment used: None                      ADL either performed or assessed with clinical judgement   ADL Overall ADL's : Needs assistance/impaired     Grooming: Wash/dry hands;Modified independent;Standing Grooming Details (indicate cue type and reason): standing at sink with no LOB             Lower Body Dressing: Modified independent;Sitting/lateral leans Lower Body Dressing Details (indicate cue type and reason): donning socks without difficulty Toilet Transfer: Modified Independent;Ambulation Toilet Transfer Details (indicate cue type and reason): standing for toileting with no LOB Toileting- Clothing Manipulation and Hygiene: Modified independent;Sit to/from stand Toileting - Clothing Manipulation Details (indicate cue type and reason): doffing/donning  undergarments without difficulty     Functional mobility during ADLs: Supervision/safety General ADL Comments: Supervision for managing IV line during session     Vision Baseline Vision/History: No visual deficits Patient Visual Report: No change from baseline Vision Assessment?: No apparent visual deficits            Pertinent Vitals/Pain Pain Assessment: No/denies pain     Hand Dominance Right   Extremity/Trunk Assessment Upper Extremity Assessment Upper Extremity Assessment: Overall WFL for tasks assessed   Lower Extremity Assessment Lower Extremity Assessment: Defer to PT evaluation   Cervical / Trunk Assessment Cervical / Trunk Assessment: Normal   Communication Communication Communication: No difficulties   Cognition Arousal/Alertness: Awake/alert Behavior During Therapy: WFL for tasks assessed/performed Overall Cognitive Status: Within Functional Limits for tasks assessed                                                Home Living Family/patient expects to be discharged to:: Private residence Living Arrangements: Other relatives(brother) Available Help at Discharge: Family;Available PRN/intermittently Type of Home: House Home Access: Ramped entrance     Home Layout: One level     Bathroom Shower/Tub: Teacher, early years/pre: Standard     Home Equipment: Environmental consultant - 2 wheels;Cane - single point;Shower seat;Grab bars - tub/shower;Grab bars - toilet          Prior Functioning/Environment Level of Independence: Independent        Comments: Independent in mobility and  ADLs, driving, caring for chickens         End of Session Equipment Utilized During Treatment: Oxygen  Activity Tolerance: Patient tolerated treatment well Patient left: in chair;with call bell/phone within reach  OT Visit Diagnosis: Muscle weakness (generalized) (M62.81)                Time: [id number] OT Time Calculation (min): 15 min Charges:   OT General Charges $OT Visit: 1 Visit OT Evaluation $OT Eval Low Complexity: [place], OTR/L  [phone] 11/25/2019, 8:41 AM

## 2019-11-25 NOTE — Progress Notes (Signed)
37- Family called and updated about pt's condition. Family had called earlier r/t pt calling them to pick him up, not understanding why he was in the hospital. Upon assessment, pt A&O x3, unable to say why he was in the hospital except "to get fixed up, checked out, and see what's wrong with me." Pt was reminded that he has pneumonia and if able to ambulate without O2 sats dropping tomorrow, he will be sent home. Pt expressed anxiety about his quails and chickens left at home and wants to go and care for them because he spent a lot of money on them. Using teachback method pt reoriented and given med for anxiety per MAR. Will continue to monitor.

## 2019-11-26 MED ORDER — POLYETHYLENE GLYCOL 3350 17 G PO PACK: 17.0000 g | PACK | Freq: Two times a day (BID) | ORAL | 1 refills | Status: DC

## 2019-11-26 MED ORDER — VALSARTAN 40 MG PO TABS: 40.0000 mg | ORAL_TABLET | Freq: Every day | ORAL | 5 refills | Status: DC

## 2019-11-26 MED ORDER — CEFDINIR 300 MG PO CAPS: 300.0000 mg | ORAL_CAPSULE | Freq: Two times a day (BID) | ORAL | 0 refills | Status: AC

## 2019-11-26 MED ORDER — GUAIFENESIN ER 600 MG PO TB12: 600.0000 mg | ORAL_TABLET | Freq: Two times a day (BID) | ORAL | 2 refills | Status: DC

## 2019-11-26 MED ORDER — ACETAMINOPHEN 325 MG PO TABS: 650.0000 mg | ORAL_TABLET | Freq: Four times a day (QID) | ORAL | 0 refills | Status: AC | PRN

## 2019-11-26 MED ORDER — ALBUTEROL SULFATE HFA 108 (90 BASE) MCG/ACT IN AERS: 2.0000 | INHALATION_SPRAY | RESPIRATORY_TRACT | 0 refills | Status: AC | PRN

## 2019-11-26 MED ORDER — AMLODIPINE BESYLATE 2.5 MG PO TABS: 2.5000 mg | ORAL_TABLET | Freq: Every day | ORAL | 5 refills | Status: DC

## 2019-11-26 MED ORDER — AZITHROMYCIN 500 MG PO TABS
500.0000 mg | ORAL_TABLET | Freq: Every day | ORAL | 0 refills | Status: AC
Start: 2019-11-26 — End: 2019-12-01

## 2019-11-26 NOTE — Discharge Instructions (Signed)
1) please decrease valsartan to 40 mg daily from 80 mg daily 2) please decrease amlodipine to 2.5 mg daily from 5 mg daily 3) please take azithromycin and Omnicef antibiotics as prescribed for pneumonia 4) please follow-up with your primary care physician within a week for recheck and reevaluation

## 2019-11-26 NOTE — Care Management Important Message (Signed)
Important Message  Patient Details  Name: Travis Beck MRN: [id number] Date of Birth: January 18, 1946   Medicare Important Message Given:  Yes     Tommy Medal 11/26/2019, 12:20 PM

## 2019-11-26 NOTE — Discharge Summary (Signed)
Travis Beck, is a 74 y.o. male  DOB 1946/01/03  MRN [id number].  Admission date:  11/24/2019  Admitting Physician  Mauricio Gerome Apley, MD  Discharge Date:  11/26/2019   Primary MD  Monico Blitz, MD  Recommendations for primary care physician for things to follow:    1) please decrease valsartan to 40 mg daily from 80 mg daily 2) please decrease amlodipine to 2.5 mg daily from 5 mg daily 3) please take azithromycin and Omnicef antibiotics as prescribed for pneumonia 4) please follow-up with your primary care physician within a week for recheck and reevaluation  Admission Diagnosis  Pneumonia [J18.9]   Discharge Diagnosis  Pneumonia [J18.9]    Principal Problem:   Pneumonia Active Problems:   Pulmonary emphysema ([place])   GERD (gastroesophageal reflux disease)   Esophageal dysphagia   Anemia, unspecified   Acute hypoxemic respiratory failure ([place])      Past Medical History:  Diagnosis Date  . Arthritis   . Asthma    Childhood  . Diabetes ([place])   . Hypertension     Past Surgical History:  Procedure Laterality Date  . BIOPSY  08/30/2018   Procedure: BIOPSY;  Surgeon: Rogene Houston, MD;  [facility];  Service: Endoscopy;;  gastric   . COLONOSCOPY    . ESOPHAGEAL DILATION N/A 05/21/2019   Procedure: ESOPHAGEAL DILATION;  Surgeon: Rogene Houston, MD;  [facility];  Service: Endoscopy;  Laterality: N/A;  . ESOPHAGOGASTRODUODENOSCOPY N/A 05/21/2019   Procedure: ESOPHAGOGASTRODUODENOSCOPY (EGD);  Surgeon: Rogene Houston, MD;  [facility];  Service: Endoscopy;  Laterality: N/A;  730  . ESOPHAGOGASTRODUODENOSCOPY (EGD) WITH PROPOFOL N/A 08/30/2018   Procedure: ESOPHAGOGASTRODUODENOSCOPY (EGD) WITH PROPOFOL;  Surgeon: Rogene Houston, MD;  [facility];  Service: Endoscopy;  Laterality: N/A;  1:30  . HERNIA REPAIR     bilateral lower  abdomen     HPI  from the history and physical done on the day of admission:    Chief Complaint: Altered mental status.   HPI: Travis Beck is a 74 y.o. male with medical history significant of arthritis, childhood asthma, type 2 diabetes mellitus and hypertension.   Patient reports 3 to 4 days of not feeling well, generalized malaise, difficulty sleeping and poor appetite.  He reports coughing, moderate in intensity, persistent for the last 4 days, tends to be dry, no improving or worsening factors.  He lives with his brother, he is able to do his activities of daily living independently.   He has been vaccinated for COVID-19.  ED Course: Patient was found febrile and hypoxic, 89% on room air.  His chest radiograph suggested of pneumonia.  He received cefepime, metronidazole, vancomycin and albuterol.  Fever control with acetaminophen.  Referred for further inpatient evaluation.   Hospital Course:    Brief Summary:- 74 y.o.malewith medical history significant ofarthritis, childhood asthma, type 2 diabetes mellitus and hypertension admitted on 11/24/19 with acute hypoxic respiratory failure secondary to multifocal pneumonia  A/p 1)Multifocal Pneumonia--he previously completed Covid vaccination -shob at rest resolved,  DOE and hypoxia resolved -Cough improving and currently no longer productive -Treated with iv Rocephin and azithromycin along with mucolytics and bronchodilators -Discharge on Omnicef and azithromycin  2)Acute Hypoxic Resp Failure--resolved, management as above #1  3)DM2-okay to resume PTA glimepiride and Metformin,    4)HTN--decrease amlodipine to 2.5 mg daily, decrease valsartan to 40 mg daily, follow-up with PCP within a week for BP recheck and possible medication adjustments  5)-Chronic pain syndrome and anxiety--stable, continue Xanax, gabapentin and buprenorphine  6)Hyponatremia--- most likely related to volume depletion, cannot exclude  some component of SIADH in the patient with pneumonia -Clinically stable  Disposition--- discharge homeCode Status : full   Family Communication:   NA (patient is alert, awake and coherent)  Consults  :  Na    Discharge Condition: Stable with resolved hypoxia  Follow UP  Follow-up Information    Monico Blitz, MD. Schedule an appointment as soon as possible for a visit in 1 week(s).   Specialty: Internal Medicine Why: Follow-up on blood pressure and pneumonia Contact information: [address] [place] [address] [phone]        Herminio Commons, MD .   Specialty: Cardiology Contact information: [place] [address] [phone]            Diet and Activity recommendation:  As advised  Discharge Instructions    Discharge Instructions    Call MD for:  difficulty breathing, headache or visual disturbances   Complete by: As directed    Call MD for:  extreme fatigue   Complete by: As directed    Call MD for:  persistant dizziness or light-headedness   Complete by: As directed    Call MD for:  persistant nausea and vomiting   Complete by: As directed    Call MD for:  severe uncontrolled pain   Complete by: As directed    Call MD for:  temperature >100.4   Complete by: As directed    Diet - low sodium heart healthy   Complete by: As directed    Diet Carb Modified   Complete by: As directed    Discharge instructions   Complete by: As directed    1) please decrease valsartan to 40 mg daily from 80 mg daily 2) please decrease amlodipine to 2.5 mg daily from 5 mg daily 3) please take azithromycin and Omnicef antibiotics as prescribed for pneumonia 4) please follow-up with your primary care physician within a week for recheck and reevaluation   Increase activity slowly   Complete by: As directed         Discharge Medications     Allergies as of 11/26/2019      Reactions   Penicillins Rash   No problems with ampicillin during  hospitalization      Medication List    TAKE these medications   acetaminophen 325 MG tablet Commonly known as: TYLENOL Take 2 tablets (650 mg total) by mouth every 6 (six) hours as needed for mild pain (or Fever >/= 101).   albuterol 108 (90 Base) MCG/ACT inhaler Commonly known as: VENTOLIN HFA Inhale 2 puffs into the lungs every 4 (four) hours as needed for wheezing or shortness of breath. What changed:   how much to take  reasons to take this   ALPRAZolam 1 MG tablet Commonly known as: XANAX Take 1 tablet (1 mg total) by mouth 3 (three) times daily  as needed for anxiety.   amLODipine 2.5 MG tablet Commonly known as: NORVASC Take 1 tablet (2.5 mg total) by mouth daily. For BP What changed:   medication strength  how much to take  additional instructions   azithromycin 500 MG tablet Commonly known as: ZITHROMAX Take 1 tablet (500 mg total) by mouth daily for 5 days.   buprenorphine 8 MG Subl SL tablet Commonly known as: SUBUTEX Place 8 mg under the tongue daily.   cefdinir 300 MG capsule Commonly known as: OMNICEF Take 1 capsule (300 mg total) by mouth 2 (two) times daily for 5 days.   fluticasone 50 MCG/ACT nasal spray Commonly known as: FLONASE Place 2 sprays into both nostrils daily.   gabapentin 400 MG capsule Commonly known as: NEURONTIN Take 1 capsule (400 mg total) by mouth every 8 (eight) hours as needed.   glyBURIDE 5 MG tablet Commonly known as: DIABETA Take 10 mg by mouth 2 (two) times daily with a meal.   guaiFENesin 600 MG 12 hr tablet Commonly known as: MUCINEX Take 1 tablet (600 mg total) by mouth 2 (two) times daily.   ipratropium-albuterol 0.5-2.5 (3) MG/3ML Soln Commonly known as: DUONEB Inhale 3 mLs into the lungs every 6 (six) hours as needed.   levocetirizine 5 MG tablet Commonly known as: XYZAL Take 1 tablet by mouth daily.   metFORMIN 1000 MG tablet Commonly known as: GLUCOPHAGE Take 1,000 mg by mouth 2 (two) times daily  with a meal.   pantoprazole 40 MG tablet Commonly known as: PROTONIX TAKE 1 TABLET BY MOUTH TWICE DAILY BEFORE A MEAL What changed: See the new instructions.   polyethylene glycol 17 g packet Commonly known as: MIRALAX / GLYCOLAX Take 17 g by mouth 2 (two) times daily.   rosuvastatin 20 MG tablet Commonly known as: CRESTOR TAKE 1 TABLET BY MOUTH EVERY DAY   Trelegy Ellipta 100-62.5-25 MCG/INH Aepb Generic drug: Fluticasone-Umeclidin-Vilant inhale ONE PUFF into THE lungs DAILY What changed: See the new instructions.   triamcinolone cream 0.1 % Commonly known as: KENALOG Apply 1 application topically daily as needed.   valsartan 40 MG tablet Commonly known as: DIOVAN Take 1 tablet (40 mg total) by mouth daily. For Heart and blood pressure What changed:   medication strength  how much to take  additional instructions       Major procedures and Radiology Reports - PLEASE review detailed and final reports for all details, in brief -    CT Head Wo Contrast  Result Date: 11/24/2019 CLINICAL DATA:  Difficulty sleeping EXAM: CT HEAD WITHOUT CONTRAST TECHNIQUE: Contiguous axial images were obtained from the base of the skull through the vertex without intravenous contrast. COMPARISON:  CT 12/18/2018, MR 12/10/2018 FINDINGS: Brain: Unchanged hypoattenuating foci in the external capsule may reflect remote lacunar infarct or prominent perivascular spaces. No evidence of acute infarction, hemorrhage, hydrocephalus, extra-axial collection or mass lesion/mass effect. Symmetric prominence of the ventricles, cisterns and sulci compatible with parenchymal volume loss. Patchy areas of white matter hypoattenuation are most compatible with chronic microvascular angiopathy. Vascular: Atherosclerotic calcification of the carotid siphons and intradural vertebral arteries. No hyperdense vessel. Skull: No calvarial fracture or suspicious osseous lesion. No scalp swelling or hematoma. Sinuses/Orbits:  Minimal thickening in the ethmoids. Remaining paranasal sinuses and mastoid air cells are predominantly clear. Included orbital structures are unremarkable. Other: None IMPRESSION: No acute intracranial abnormality. Stable hypoattenuating foci in the external capsules, likely remote lacunar infarct versus perivascular space. Chronic microvascular angiopathy and parenchymal volume  loss Electronically Signed   By: Lovena Le M.D.   On: 11/24/2019 16:49   DG Chest Port 1 View  Result Date: 11/24/2019 CLINICAL DATA:  Fever, cough. EXAM: PORTABLE CHEST 1 VIEW COMPARISON:  February 18, 2019. FINDINGS: The heart size and mediastinal contours are within normal limits. No pneumothorax or pleural effusion is noted. Multiple airspace opacities are noted in both lung bases concerning for multifocal pneumonia. The visualized skeletal structures are unremarkable. IMPRESSION: Multiple bibasilar airspace opacities are noted concerning for multifocal pneumonia. Electronically Signed   By: Marijo Conception M.D.   On: 11/24/2019 14:49    Micro Results   Recent Results (from the past 240 hour(s))  Culture, blood (routine x 2)     Status: None (Preliminary result)   Collection Time: 11/24/19 12:35 PM   Specimen: BLOOD  Result Value Ref Range Status   Specimen Description BLOOD RIGHT ANTECUBITAL DRAWN BY RN LL  Final   Special Requests   Final    BOTTLES DRAWN AEROBIC AND ANAEROBIC Blood Culture adequate volume   Culture   Final    NO GROWTH 2 DAYS Performed at [facility], [address], [place], [place] [address]    Report Status PENDING  Incomplete  SARS Coronavirus 2 by RT PCR (hospital order, performed in [facility] hospital lab) Nasopharyngeal Urine, Catheterized     Status: None   Collection Time: 11/24/19  2:25 PM   Specimen: Urine, Catheterized; Nasopharyngeal  Result Value Ref Range Status   SARS Coronavirus 2 NEGATIVE NEGATIVE Final    Comment: (NOTE) SARS-CoV-2 target nucleic acids are NOT  DETECTED. The SARS-CoV-2 RNA is generally detectable in upper and lower respiratory specimens during the acute phase of infection. The lowest concentration of SARS-CoV-2 viral copies this assay can detect is 250 copies / mL. A negative result does not preclude SARS-CoV-2 infection and should not be used as the sole basis for treatment or other patient management decisions.  A negative result may occur with improper specimen collection / handling, submission of specimen other than nasopharyngeal swab, presence of viral mutation(s) within the areas targeted by this assay, and inadequate number of viral copies (<250 copies / mL). A negative result must be combined with clinical observations, patient history, and epidemiological information. Fact Sheet for Patients:   StrictlyIdeas.no Fact Sheet for Healthcare Providers: BankingDealers.co.za This test is not yet approved or cleared  by the [place] FDA and has been authorized for detection and/or diagnosis of SARS-CoV-2 by FDA under an Emergency Use Authorization (EUA).  This EUA will remain in effect (meaning this test can be used) for the duration of the COVID-19 declaration under Section 564(b)(1) of the Act, 21 U.S.C. section 360bbb-3(b)(1), unless the authorization is terminated or revoked sooner. Performed at [facility], [address], [place], [place] [address]   Urine culture     Status: None   Collection Time: 11/24/19  2:26 PM   Specimen: Urine, Clean Catch  Result Value Ref Range Status   Specimen Description   Final    URINE, CLEAN CATCH Performed at [facility], [address], [place], [place] [address]    Special Requests   Final    NONE Performed at [facility], [address] [place], [place] [address]    Culture   Final    NO GROWTH Performed at [facility], [place] [address], [place], [place] [address]    Report Status 11/25/2019 FINAL  Final  Culture,  blood (routine  x 2)     Status: None (Preliminary result)   Collection Time: 11/24/19  3:02 PM   Specimen: Left Antecubital; Blood  Result Value Ref Range Status   Specimen Description LEFT ANTECUBITAL  Final   Special Requests   Final    BOTTLES DRAWN AEROBIC AND ANAEROBIC Blood Culture adequate volume   Culture   Final    NO GROWTH 2 DAYS Performed at [facility], [address], [place], [place] [address]    Report Status PENDING  Incomplete  MRSA PCR Screening     Status: None   Collection Time: 11/24/19  4:57 PM   Specimen: Nasal Mucosa; Nasopharyngeal  Result Value Ref Range Status   MRSA by PCR NEGATIVE NEGATIVE Final    Comment:        The GeneXpert MRSA Assay (FDA approved for NASAL specimens only), is one component of a comprehensive MRSA colonization surveillance program. It is not intended to diagnose MRSA infection nor to guide or monitor treatment for MRSA infections. Performed at [facility], [address], [place], [place] [address]        Today   Subjective    Travis Beck today has no new complaints -Ambulating without significant dyspnea and exertional hypoxia -Cough is improving          Patient has been seen and examined prior to discharge   Objective   Blood pressure (!) 123/59, pulse 83, temperature 99 F (37.2 C), temperature source Oral, resp. rate 20, height 5\' 5"  (1.651 m), weight 71 kg, SpO2 92 %.   Intake/Output Summary (Last 24 hours) at 11/26/2019 1558 Last data filed at 11/26/2019 1300 Gross per 24 hour  Intake 480 ml  Output --  Net 480 ml    Exam Gen:- Awake Alert, no acute distress , speaking in sentences HEENT:- [place].AT, No sclera icterus Neck-Supple Neck,No JVD,.  Lungs-improved air movement, no wheezing,  CV- S1, S2 normal, regular Abd-  +ve B.Sounds, Abd Soft, No tenderness,    Extremity/Skin:- No  edema,   good pulses Psych-affect is appropriate, oriented x3 Neuro-no new focal deficits, no tremors    Data  Review   CBC w Diff:  Lab Results  Component Value Date   WBC 6.8 11/25/2019   HGB 9.6 (L) 11/25/2019   HCT 29.9 (L) 11/25/2019   PLT 206 11/25/2019   LYMPHOPCT 8 11/24/2019   MONOPCT 6 11/24/2019   EOSPCT 1 11/24/2019   BASOPCT 0 11/24/2019    CMP:  Lab Results  Component Value Date   NA 131 (L) 11/25/2019   K 3.3 (L) 11/25/2019   CL 98 11/25/2019   CO2 24 11/25/2019   BUN 9 11/25/2019   CREATININE 0.82 11/25/2019   PROT 6.6 11/24/2019   ALBUMIN 3.6 11/24/2019   BILITOT 1.3 (H) 11/24/2019   ALKPHOS 50 11/24/2019   AST 55 (H) 11/24/2019   ALT 23 11/24/2019  .   Total Discharge time is about 33 minutes  Roxan Hockey M.D on 11/26/2019 at 3:58 PM  Go to [email or web address] -  for contact info  [facility] - Office  [phone]

## 2019-11-26 NOTE — Plan of Care (Signed)

## 2019-11-28 DIAGNOSIS — Z299 Encounter for prophylactic measures, unspecified: Secondary | ICD-10-CM | POA: Diagnosis not present

## 2019-11-28 DIAGNOSIS — I10 Essential (primary) hypertension: Secondary | ICD-10-CM | POA: Diagnosis not present

## 2019-11-28 DIAGNOSIS — J449 Chronic obstructive pulmonary disease, unspecified: Secondary | ICD-10-CM | POA: Diagnosis not present

## 2019-11-28 DIAGNOSIS — J189 Pneumonia, unspecified organism: Secondary | ICD-10-CM | POA: Diagnosis not present

## 2019-11-28 DIAGNOSIS — E1165 Type 2 diabetes mellitus with hyperglycemia: Secondary | ICD-10-CM | POA: Diagnosis not present

## 2019-11-29 LAB — CULTURE, BLOOD (ROUTINE X 2)
Culture: NO GROWTH
Culture: NO GROWTH
Special Requests: ADEQUATE
Special Requests: ADEQUATE

## 2019-12-07 DIAGNOSIS — I10 Essential (primary) hypertension: Secondary | ICD-10-CM | POA: Diagnosis not present

## 2019-12-07 DIAGNOSIS — E119 Type 2 diabetes mellitus without complications: Secondary | ICD-10-CM | POA: Diagnosis not present

## 2019-12-11 DIAGNOSIS — Z79891 Long term (current) use of opiate analgesic: Secondary | ICD-10-CM | POA: Diagnosis not present

## 2019-12-11 DIAGNOSIS — F419 Anxiety disorder, unspecified: Secondary | ICD-10-CM | POA: Diagnosis not present

## 2019-12-11 DIAGNOSIS — M545 Low back pain: Secondary | ICD-10-CM | POA: Diagnosis not present

## 2019-12-11 DIAGNOSIS — M25569 Pain in unspecified knee: Secondary | ICD-10-CM | POA: Diagnosis not present

## 2019-12-11 DIAGNOSIS — M542 Cervicalgia: Secondary | ICD-10-CM | POA: Diagnosis not present

## 2019-12-11 DIAGNOSIS — M130 Polyarthritis, unspecified: Secondary | ICD-10-CM | POA: Diagnosis not present

## 2019-12-11 NOTE — Progress Notes (Signed)
Cardiology Office Note  Date: 12/12/2019   ID: Vollie, Martie 1946/03/06, MRN [id number]  PCP:  Monico Blitz, MD  Cardiologist:  Kate Sable, MD Electrophysiologist:  None   Chief Complaint: Follow-up coronary artery disease  History of Present Illness: Travis Beck is a 74 y.o. male with a history of CAD, HLD, type II DM, emphysema, prior history of tobacco use.  Last encounter with Dr. Bronson Ing via telemedicine 08/06/2019.  Patient had a previous nuclear stress test on 09/13/2018 suggestive of a prior MI.  Dr. Bronson Ing was hesitant to start aspirin on patient due to significant anemia.  He reduced amlodipine to 5 mg daily and started Lopressor 25 mg p.o. twice daily.  He also switched pravastatin to rosuvastatin 20 mg daily.  Patient denied any chest pain or palpitations.  Did complain of chronic shortness of breath from emphysema which was stable.  He was trying to walk for exercise.  Recent admission for pneumonia on 11/26/2019.Patient presented with altered mental status.  He reported several days of not feeling well with generalized malaise difficulty sleeping and poor appetite.  He was febrile and hypoxic in the emergency department with O2 sat on room air of 89%.  Chest x-ray suggested pneumonia.  He was treated with IV Rocephin and azithromycin along with mucolytic's and bronchodilators.  Discharged on Omnicef and azithromycin.  His amlodipine was decreased to 2.5 mg daily and valsartan to 40 mg daily.  He was to follow-up with PCP and recheck blood pressure with possible medication adjustments.  Patient was apparently hyponatremic during hospital stay due to most likely volume depletion.  Could not rule out a component of SIADH.  Patient states he has been doing well since discharge from hospital for pneumonia.  His only complaint is he feels bad all the time with low energy.  States he walks every day but just has no energy.  He denies any anginal symptoms,  palpitations or arrhythmias, CVA or TIA-like symptoms, bleeding issues, PND, orthopnea, claudication, DVT or PE-like symptoms, or lower extremity edema.  States he takes several vitamins to help with energy.  States he was ordered Flintstones vitamins recently by a provider.  Past Medical History:  Diagnosis Date  . Arthritis   . Asthma    Childhood  . Diabetes ([place])   . Hypertension     Past Surgical History:  Procedure Laterality Date  . BIOPSY  08/30/2018   Procedure: BIOPSY;  Surgeon: Rogene Houston, MD;  [facility];  Service: Endoscopy;;  gastric   . COLONOSCOPY    . ESOPHAGEAL DILATION N/A 05/21/2019   Procedure: ESOPHAGEAL DILATION;  Surgeon: Rogene Houston, MD;  [facility];  Service: Endoscopy;  Laterality: N/A;  . ESOPHAGOGASTRODUODENOSCOPY N/A 05/21/2019   Procedure: ESOPHAGOGASTRODUODENOSCOPY (EGD);  Surgeon: Rogene Houston, MD;  [facility];  Service: Endoscopy;  Laterality: N/A;  730  . ESOPHAGOGASTRODUODENOSCOPY (EGD) WITH PROPOFOL N/A 08/30/2018   Procedure: ESOPHAGOGASTRODUODENOSCOPY (EGD) WITH PROPOFOL;  Surgeon: Rogene Houston, MD;  [facility];  Service: Endoscopy;  Laterality: N/A;  1:30  . HERNIA REPAIR     bilateral lower abdomen    Current Outpatient Medications  Medication Sig Dispense Refill  . acetaminophen (TYLENOL) 325 MG tablet Take 2 tablets (650 mg total) by mouth every 6 (six) hours as needed for mild pain (or Fever >/= 101). 12 tablet 0  . albuterol (VENTOLIN HFA) 108 (90 Base) MCG/ACT inhaler Inhale 2 puffs into  the lungs every 4 (four) hours as needed for wheezing or shortness of breath. 18 g 0  . ALPRAZolam (XANAX) 1 MG tablet Take 1 tablet (1 mg total) by mouth 3 (three) times daily as needed for anxiety. 30 tablet 0  . amLODipine (NORVASC) 2.5 MG tablet Take 1 tablet (2.5 mg total) by mouth daily. For BP 30 tablet 5  . buprenorphine (SUBUTEX) 8 MG SUBL SL tablet Place 8 mg under the tongue  daily.     . cetirizine (ZYRTEC) 10 MG tablet Take 10 mg by mouth as needed for allergies (itching).    . fluticasone (FLONASE) 50 MCG/ACT nasal spray Place 2 sprays into both nostrils daily.     Marland Kitchen gabapentin (NEURONTIN) 400 MG capsule Take 1 capsule (400 mg total) by mouth every 8 (eight) hours as needed.  1  . glyBURIDE (DIABETA) 5 MG tablet Take 10 mg by mouth 2 (two) times daily with a meal.     . guaiFENesin (MUCINEX) 600 MG 12 hr tablet Take 1 tablet (600 mg total) by mouth 2 (two) times daily. 20 tablet 2  . ipratropium-albuterol (DUONEB) 0.5-2.5 (3) MG/3ML SOLN Inhale 3 mLs into the lungs every 6 (six) hours as needed.    Marland Kitchen levocetirizine (XYZAL) 5 MG tablet Take 1 tablet by mouth. occasionally    . metFORMIN (GLUCOPHAGE) 1000 MG tablet Take 1,000 mg by mouth 2 (two) times daily with a meal.     . pantoprazole (PROTONIX) 40 MG tablet TAKE 1 TABLET BY MOUTH TWICE DAILY BEFORE A MEAL (Patient taking differently: Take 40 mg by mouth 2 (two) times daily. ) 180 tablet 3  . polyethylene glycol (MIRALAX / GLYCOLAX) 17 g packet Take 17 g by mouth 2 (two) times daily. 60 each 1  . rosuvastatin (CRESTOR) 20 MG tablet TAKE 1 TABLET BY MOUTH EVERY DAY (Patient taking differently: Take 20 mg by mouth daily. ) 90 tablet 1  . TRELEGY ELLIPTA 100-62.5-25 MCG/INH AEPB inhale ONE PUFF into THE lungs DAILY (Patient taking differently: Inhale 1 puff into the lungs daily. ) 60 each 2  . triamcinolone cream (KENALOG) 0.1 % Apply 1 application topically daily as needed.    . valsartan (DIOVAN) 40 MG tablet Take 1 tablet (40 mg total) by mouth daily. For Heart and blood pressure 30 tablet 5   No current facility-administered medications for this visit.   Allergies:  Penicillins   Social History: The patient  reports that he quit smoking about 26 years ago. His smoking use included cigarettes. He has a 66.00 pack-year smoking history. He has never used smokeless tobacco. He reports previous alcohol use. He  reports that he does not use drugs.   Family History: The patient's family history is not on file.   ROS:  Please see the history of present illness. Otherwise, complete review of systems is positive for none.  All other systems are reviewed and negative.   Physical Exam: VS:  BP 132/80   Pulse 71   Ht 5\' 5"  (1.651 m)   Wt 149 lb 9.6 oz (67.9 kg)   SpO2 97%   BMI 24.89 kg/m , BMI Body mass index is 24.89 kg/m.  Wt Readings from Last 3 Encounters:  12/12/19 149 lb 9.6 oz (67.9 kg)  11/24/19 156 lb 8.4 oz (71 kg)  09/05/19 163 lb 9.6 oz (74.2 kg)    General: Patient appears comfortable at rest. Neck: Supple, no elevated JVP or carotid bruits, no thyromegaly. Lungs: Clear to auscultation,  nonlabored breathing at rest. Cardiac: Regular rate and rhythm, no S3 or significant systolic murmur, no pericardial r. Extremities: No pitting edema, distal pulses 2+. Skin: Warm and dry. Musculoskeletal: No kyphosis. Neuropsychiatric: Alert and oriented x3, affect grossly appropriate.  ECG:  EKG on 11/24/2019 showed sinus rhythm rate of 89.  Prolonged QT interval greater than 500 ms at 513.  Recent Labwork: 11/24/2019: ALT 23; AST 55; B Natriuretic Peptide 211.0 11/25/2019: BUN 9; Creatinine, Ser 0.82; Hemoglobin 9.6; Platelets 206; Potassium 3.3; Sodium 131  No results found for: CHOL, TRIG, HDL, CHOLHDL, VLDL, LDLCALC, LDLDIRECT  Other Studies Reviewed Today:  Nuclear stress test 09/13/2018:   Blood pressure demonstrated a normal response to exercise.  There was no ST segment deviation noted during stress.  Findings consistent with prior moderate inferior myocardial infarction with mild peri-infarct ischemia.  The left ventricular ejection fraction is normal (55-65%).  This is a low risk study.  Assessment and Plan:  1. CAD in native artery   2. Essential hypertension   3. Hyperlipidemia LDL goal <70   4. Anemia, unspecified type   5. Other fatigue    1. CAD in native  artery Patient denies any current anginal or exertional symptoms.  Not on aspirin due to history of significant anemia.  2. Essential hypertension Blood pressure today 132/80.  Continue amlodipine 2.5 mg, valsartan 40 mg daily.  3. Hyperlipidemia LDL goal <70 Continue Crestor 20 mg daily.Recent lipid panel at PCP office showed a total cholesterol of 94, triglycerides 236, HDL 33, LDL 25.  4. Anemia, unspecified type Patient has chronic anemia.  Recent hemoglobin and hematocrit on 11/25/2019 showed 9.6 and 29.9.Marland Kitchen Previous CBC at PCP office on 05/30/2019 hemoglobin 10.5 and hematocrit 32.5.  Order a CBC.  5.  Fatigue/low energy Please order a BMP and TSH, T3, T4, vitamin B12, vitamin D.  Patient complaining of significant fatigue  Medication Adjustments/Labs and Tests Ordered: Current medicines are reviewed at length with the patient today.  Concerns regarding medicines are outlined above.    Disposition: Follow-up with Dr. Bronson Ing or APP 3 months.  Signed, Levell July, NP 12/12/2019 1:44 PM    [facility] at [place], [place], [place] [address] Phone: [phone]; Fax: [phone]

## 2019-12-12 ENCOUNTER — Other Ambulatory Visit: Payer: Self-pay

## 2019-12-12 ENCOUNTER — Encounter: Payer: Self-pay | Admitting: Family Medicine

## 2019-12-12 ENCOUNTER — Ambulatory Visit (INDEPENDENT_AMBULATORY_CARE_PROVIDER_SITE_OTHER): Payer: Medicare Other | Admitting: Family Medicine

## 2019-12-12 VITALS — BP 132/80 | HR 71 | Ht 65.0 in | Wt 149.6 lb

## 2019-12-12 DIAGNOSIS — R5383 Other fatigue: Secondary | ICD-10-CM | POA: Diagnosis not present

## 2019-12-12 DIAGNOSIS — I10 Essential (primary) hypertension: Secondary | ICD-10-CM

## 2019-12-12 DIAGNOSIS — I2510 Atherosclerotic heart disease of native coronary artery without angina pectoris: Secondary | ICD-10-CM | POA: Diagnosis not present

## 2019-12-12 DIAGNOSIS — E785 Hyperlipidemia, unspecified: Secondary | ICD-10-CM | POA: Diagnosis not present

## 2019-12-12 DIAGNOSIS — D649 Anemia, unspecified: Secondary | ICD-10-CM

## 2019-12-12 NOTE — Patient Instructions (Signed)
Medication Instructions:  Continue all current medications.  Labwork:  BMET, TSH, T3, T4, Vit D, Vit B12 - orders given today.   Office will contact with results via phone or letter.    Testing/Procedures: none  Follow-Up: 3 months   Any Other Special Instructions Will Be Listed Below (If Applicable).  If you need a refill on your cardiac medications before your next appointment, please call your pharmacy.

## 2019-12-15 ENCOUNTER — Telehealth: Payer: Self-pay | Admitting: *Deleted

## 2019-12-15 ENCOUNTER — Other Ambulatory Visit (HOSPITAL_COMMUNITY)
Admission: RE | Admit: 2019-12-15 | Discharge: 2019-12-15 | Disposition: A | Discharge status: Home / Self Care | Payer: Medicare Other | Source: Ambulatory Visit | Attending: Family Medicine | Admitting: Family Medicine

## 2019-12-15 ENCOUNTER — Other Ambulatory Visit: Payer: Self-pay

## 2019-12-15 DIAGNOSIS — I10 Essential (primary) hypertension: Secondary | ICD-10-CM | POA: Insufficient documentation

## 2019-12-15 DIAGNOSIS — R5383 Other fatigue: Secondary | ICD-10-CM | POA: Diagnosis not present

## 2019-12-15 DIAGNOSIS — D649 Anemia, unspecified: Secondary | ICD-10-CM | POA: Insufficient documentation

## 2019-12-15 LAB — BASIC METABOLIC PANEL
Anion gap: 13 (ref 5–15)
BUN: 12 mg/dL (ref 8–23)
CO2: 24 mmol/L (ref 22–32)
Calcium: 9.2 mg/dL (ref 8.9–10.3)
Chloride: 100 mmol/L (ref 98–111)
Creatinine, Ser: 0.74 mg/dL (ref 0.61–1.24)
GFR calc Af Amer: >60 mL/min (ref >60)
GFR calc non Af Amer: >60 mL/min (ref >60)
Glucose, Bld: 117 mg/dL — ABNORMAL HIGH (ref 70–99)
Potassium: 4 mmol/L (ref 3.5–5.1)
Sodium: 137 mmol/L (ref 135–145)

## 2019-12-15 LAB — TSH: TSH: 5.381 u[IU]/mL — ABNORMAL HIGH (ref 0.350–4.500)

## 2019-12-15 LAB — T4, FREE: Free T4: 0.83 ng/dL (ref 0.61–1.12)

## 2019-12-15 LAB — VITAMIN D 25 HYDROXY (VIT D DEFICIENCY, FRACTURES): Vit D, 25-Hydroxy: 42.22 ng/mL (ref 30–100)

## 2019-12-15 LAB — VITAMIN B12: Vitamin B-12: 855 pg/mL (ref 180–914)

## 2019-12-15 NOTE — Telephone Encounter (Signed)
-----   Message from Verta Ellen., NP sent at 12/15/2019  1:32 PM EDT ----- Please call the patient and tell him most of his lab work looked ok. His vitamin D and B12 looked good.   His electrolytes looked good except for glucose was a little elevated at 117 but looks much better than previous fasting glucose levels.  His TSH is elevated but T4 was normal . He could have subclinical hypothyroidism. Tell him to follow up with Dr Manuella Ghazi on his TSH level. Its not extremely elevated but up a little.

## 2019-12-15 NOTE — Telephone Encounter (Signed)
CBC ordered at last visit but patient was unaware. Lab work done this morning at [facility] [place] lab. Lab contacted to add on CBC and was unable to add on CBC. Patient will be contacted

## 2019-12-15 NOTE — Telephone Encounter (Signed)
Patient's brother Travis Beck informed and verbalized understanding of plan. Lab order faxed to [facility]. Copy sent to PCP

## 2019-12-16 ENCOUNTER — Telehealth: Payer: Self-pay | Admitting: *Deleted

## 2019-12-16 DIAGNOSIS — D649 Anemia, unspecified: Secondary | ICD-10-CM | POA: Diagnosis not present

## 2019-12-16 LAB — T3, FREE: T3, Free: 3 pg/mL (ref 2.0–4.4)

## 2019-12-16 NOTE — Telephone Encounter (Signed)
-----   Message from Verta Ellen., NP sent at 12/16/2019  8:55 AM EDT ----- His T3 looked fine but his TSH was elevated. I noted that in a previous note. I advised to have his PCP keep an eye out for any symptoms. His TSH was only mildly elevated. Thanks

## 2019-12-23 DIAGNOSIS — I10 Essential (primary) hypertension: Secondary | ICD-10-CM | POA: Diagnosis not present

## 2019-12-23 DIAGNOSIS — E1165 Type 2 diabetes mellitus with hyperglycemia: Secondary | ICD-10-CM | POA: Diagnosis not present

## 2019-12-23 DIAGNOSIS — R5383 Other fatigue: Secondary | ICD-10-CM | POA: Diagnosis not present

## 2019-12-23 DIAGNOSIS — J449 Chronic obstructive pulmonary disease, unspecified: Secondary | ICD-10-CM | POA: Diagnosis not present

## 2019-12-23 DIAGNOSIS — Z299 Encounter for prophylactic measures, unspecified: Secondary | ICD-10-CM | POA: Diagnosis not present

## 2020-01-07 DIAGNOSIS — E119 Type 2 diabetes mellitus without complications: Secondary | ICD-10-CM | POA: Diagnosis not present

## 2020-01-07 DIAGNOSIS — I10 Essential (primary) hypertension: Secondary | ICD-10-CM | POA: Diagnosis not present

## 2020-02-04 ENCOUNTER — Other Ambulatory Visit: Payer: Self-pay | Admitting: Pulmonary Disease

## 2020-02-06 DIAGNOSIS — I10 Essential (primary) hypertension: Secondary | ICD-10-CM | POA: Diagnosis not present

## 2020-02-06 DIAGNOSIS — E119 Type 2 diabetes mellitus without complications: Secondary | ICD-10-CM | POA: Diagnosis not present

## 2020-02-17 DIAGNOSIS — E119 Type 2 diabetes mellitus without complications: Secondary | ICD-10-CM | POA: Diagnosis not present

## 2020-02-17 DIAGNOSIS — I10 Essential (primary) hypertension: Secondary | ICD-10-CM | POA: Diagnosis not present

## 2020-02-26 DIAGNOSIS — E1165 Type 2 diabetes mellitus with hyperglycemia: Secondary | ICD-10-CM | POA: Diagnosis not present

## 2020-02-26 DIAGNOSIS — I10 Essential (primary) hypertension: Secondary | ICD-10-CM | POA: Diagnosis not present

## 2020-02-26 DIAGNOSIS — Z299 Encounter for prophylactic measures, unspecified: Secondary | ICD-10-CM | POA: Diagnosis not present

## 2020-02-26 DIAGNOSIS — J449 Chronic obstructive pulmonary disease, unspecified: Secondary | ICD-10-CM | POA: Diagnosis not present

## 2020-02-26 DIAGNOSIS — S61212A Laceration without foreign body of right middle finger without damage to nail, initial encounter: Secondary | ICD-10-CM | POA: Diagnosis not present

## 2020-03-01 DIAGNOSIS — E1165 Type 2 diabetes mellitus with hyperglycemia: Secondary | ICD-10-CM | POA: Diagnosis not present

## 2020-03-01 DIAGNOSIS — R21 Rash and other nonspecific skin eruption: Secondary | ICD-10-CM | POA: Diagnosis not present

## 2020-03-01 DIAGNOSIS — J449 Chronic obstructive pulmonary disease, unspecified: Secondary | ICD-10-CM | POA: Diagnosis not present

## 2020-03-01 DIAGNOSIS — I10 Essential (primary) hypertension: Secondary | ICD-10-CM | POA: Diagnosis not present

## 2020-03-01 DIAGNOSIS — Z299 Encounter for prophylactic measures, unspecified: Secondary | ICD-10-CM | POA: Diagnosis not present

## 2020-03-01 DIAGNOSIS — S61212A Laceration without foreign body of right middle finger without damage to nail, initial encounter: Secondary | ICD-10-CM | POA: Diagnosis not present

## 2020-03-04 DIAGNOSIS — Z79891 Long term (current) use of opiate analgesic: Secondary | ICD-10-CM | POA: Diagnosis not present

## 2020-03-04 DIAGNOSIS — M545 Low back pain: Secondary | ICD-10-CM | POA: Diagnosis not present

## 2020-03-04 DIAGNOSIS — F419 Anxiety disorder, unspecified: Secondary | ICD-10-CM | POA: Diagnosis not present

## 2020-03-04 DIAGNOSIS — M542 Cervicalgia: Secondary | ICD-10-CM | POA: Diagnosis not present

## 2020-03-04 DIAGNOSIS — M25569 Pain in unspecified knee: Secondary | ICD-10-CM | POA: Diagnosis not present

## 2020-03-04 DIAGNOSIS — M130 Polyarthritis, unspecified: Secondary | ICD-10-CM | POA: Diagnosis not present

## 2020-03-28 NOTE — Progress Notes (Signed)
Cardiology Office Note  Date: 03/29/2020   ID: Beck, Travis 20-Aug-1945, MRN [id number]  PCP:  Monico Blitz, MD  Cardiologist:  No primary care provider on file. Electrophysiologist:  None   Chief Complaint: Follow-up coronary artery disease  History of Present Illness: Travis Beck is a 74 y.o. male with a history of CAD, HLD, type II DM, emphysema, prior history of tobacco use.  Last encounter with Dr. Bronson Ing via telemedicine 08/06/2019.  Patient had a previous nuclear stress test on 09/13/2018 suggestive of a prior MI.  Dr. Bronson Ing was hesitant to start aspirin on patient due to significant anemia.  He reduced amlodipine to 5 mg daily and started Lopressor 25 mg p.o. twice daily.  He also switched pravastatin to rosuvastatin 20 mg daily.  Patient denied any chest pain or palpitations.  Did complain of chronic shortness of breath from emphysema which was stable.  He was trying to walk for exercise.  Recent admission for pneumonia on 11/26/2019.Patient presented with altered mental status.  He reported several days of not feeling well with generalized malaise difficulty sleeping and poor appetite.  He was febrile and hypoxic in the emergency department with O2 sat on room air of 89%.  Chest x-ray suggested pneumonia.  He was treated with IV Rocephin and azithromycin along with mucolytic's and bronchodilators.  Discharged on Omnicef and azithromycin.  His amlodipine was decreased to 2.5 mg daily and valsartan to 40 mg daily.  He was to follow-up with PCP and recheck blood pressure with possible medication adjustments.  Patient was apparently hyponatremic during hospital stay due to most likely volume depletion.  Could not rule out a component of SIADH.  At last visit patient stated he had been doing well since discharge from hospital for pneumonia.  His only complaint was feeling bad all the time with low energy.     States he walks every day with his daughter's dog.   States he tries to stay active.  He denies any anginal symptoms, palpitations or arrhythmias, CVA or TIA-like symptoms, bleeding issues, PND, orthopnea, claudication, DVT or PE-like symptoms, or lower extremity edema.  States he takes several vitamins to help with energy.   Occasionally has some shortness of breath) associated with emphysema.  States he has occasional transient dizziness but no syncopal or near syncopal episode.  Usually occurs when bending over and attempting to stand erect again.  Denies any PND or orthopnea.  States he has an upcoming annual wellness visit with his primary care provider.  Blood pressure systolic is slightly up today on arrival.  However, the last 2 blood pressures in epic show blood pressure was well controlled.  Past Medical History:  Diagnosis Date  . Arthritis   . Asthma    Childhood  . Diabetes ([place])   . Hypertension     Past Surgical History:  Procedure Laterality Date  . BIOPSY  08/30/2018   Procedure: BIOPSY;  Surgeon: Rogene Houston, MD;  [facility];  Service: Endoscopy;;  gastric   . COLONOSCOPY    . ESOPHAGEAL DILATION N/A 05/21/2019   Procedure: ESOPHAGEAL DILATION;  Surgeon: Rogene Houston, MD;  [facility];  Service: Endoscopy;  Laterality: N/A;  . ESOPHAGOGASTRODUODENOSCOPY N/A 05/21/2019   Procedure: ESOPHAGOGASTRODUODENOSCOPY (EGD);  Surgeon: Rogene Houston, MD;  [facility];  Service: Endoscopy;  Laterality: N/A;  730  . ESOPHAGOGASTRODUODENOSCOPY (EGD) WITH PROPOFOL N/A 08/30/2018   Procedure: ESOPHAGOGASTRODUODENOSCOPY (EGD) WITH PROPOFOL;  Surgeon: Rogene Houston, MD;  [facility];  Service: Endoscopy;  Laterality: N/A;  1:30  . HERNIA REPAIR     bilateral lower abdomen    Current Outpatient Medications  Medication Sig Dispense Refill  . acetaminophen (TYLENOL) 325 MG tablet Take 2 tablets (650 mg total) by mouth every 6 (six) hours as needed for mild pain (or Fever >/= 101).  12 tablet 0  . albuterol (VENTOLIN HFA) 108 (90 Base) MCG/ACT inhaler Inhale 2 puffs into the lungs every 4 (four) hours as needed for wheezing or shortness of breath. 18 g 0  . ALPRAZolam (XANAX) 1 MG tablet Take 1 tablet (1 mg total) by mouth 3 (three) times daily as needed for anxiety. 30 tablet 0  . amLODipine (NORVASC) 2.5 MG tablet Take 1 tablet (2.5 mg total) by mouth daily. For BP 30 tablet 5  . buprenorphine (SUBUTEX) 8 MG SUBL SL tablet Place 8 mg under the tongue daily.     . cetirizine (ZYRTEC) 10 MG tablet Take 10 mg by mouth as needed for allergies (itching).    . fluticasone (FLONASE) 50 MCG/ACT nasal spray Place 2 sprays into both nostrils daily.     . Fluticasone-Umeclidin-Vilant (TRELEGY ELLIPTA) 100-62.5-25 MCG/INH AEPB Inhale 1 puff into the lungs daily. 1 each 3  . gabapentin (NEURONTIN) 400 MG capsule Take 1 capsule (400 mg total) by mouth every 8 (eight) hours as needed.  1  . glyBURIDE (DIABETA) 5 MG tablet Take 10 mg by mouth 2 (two) times daily with a meal.     . guaiFENesin (MUCINEX) 600 MG 12 hr tablet Take 1 tablet (600 mg total) by mouth 2 (two) times daily. 20 tablet 2  . ipratropium-albuterol (DUONEB) 0.5-2.5 (3) MG/3ML SOLN Inhale 3 mLs into the lungs every 6 (six) hours as needed.    Marland Kitchen levocetirizine (XYZAL) 5 MG tablet Take 1 tablet by mouth. occasionally    . metFORMIN (GLUCOPHAGE) 1000 MG tablet Take 1,000 mg by mouth 2 (two) times daily with a meal.     . pantoprazole (PROTONIX) 40 MG tablet TAKE 1 TABLET BY MOUTH TWICE DAILY BEFORE A MEAL (Patient taking differently: Take 40 mg by mouth 2 (two) times daily. ) 180 tablet 3  . polyethylene glycol (MIRALAX / GLYCOLAX) 17 g packet Take 17 g by mouth 2 (two) times daily. 60 each 1  . rosuvastatin (CRESTOR) 20 MG tablet TAKE 1 TABLET BY MOUTH EVERY DAY (Patient taking differently: Take 20 mg by mouth daily. ) 90 tablet 1  . triamcinolone cream (KENALOG) 0.1 % Apply 1 application topically daily as needed.    .  valsartan (DIOVAN) 40 MG tablet Take 1 tablet (40 mg total) by mouth daily. For Heart and blood pressure 30 tablet 5   No current facility-administered medications for this visit.   Allergies:  Penicillins   Social History: The patient  reports that he quit smoking about 27 years ago. His smoking use included cigarettes. He has a 66.00 pack-year smoking history. He has never used smokeless tobacco. He reports previous alcohol use. He reports that he does not use drugs.   Family History: The patient's family history is not on file.   ROS:  Please see the history of present illness. Otherwise, complete review of systems is positive for none.  All other systems are reviewed and negative.   Physical Exam: VS:  BP 140/78   Pulse 71   Ht 5\' 5"  (1.651 m)   Wt 154 lb (  69.9 kg)   SpO2 94%   BMI 25.63 kg/m , BMI Body mass index is 25.63 kg/m.  Wt Readings from Last 3 Encounters:  03/29/20 154 lb (69.9 kg)  12/12/19 149 lb 9.6 oz (67.9 kg)  11/24/19 156 lb 8.4 oz (71 kg)    General: Patient appears comfortable at rest. Neck: Supple, no elevated JVP or carotid bruits, no thyromegaly. Lungs: Clear to auscultation, nonlabored breathing at rest. Cardiac: Regular rate and rhythm, no S3 or significant systolic murmur, no pericardial r. Extremities: No pitting edema, distal pulses 2+. Skin: Warm and dry. Musculoskeletal: No kyphosis. Neuropsychiatric: Alert and oriented x3, affect grossly appropriate.  ECG:  EKG on 11/24/2019 showed sinus rhythm rate of 89.  Prolonged QT interval greater than 500 ms at 513.  Recent Labwork: 11/24/2019: ALT 23; AST 55; B Natriuretic Peptide 211.0 11/25/2019: Hemoglobin 9.6; Platelets 206 12/15/2019: BUN 12; Creatinine, Ser 0.74; Potassium 4.0; Sodium 137; TSH 5.381  No results found for: CHOL, TRIG, HDL, CHOLHDL, VLDL, LDLCALC, LDLDIRECT  Other Studies Reviewed Today:  Nuclear stress test 09/13/2018:   Blood pressure demonstrated a normal response to  exercise.  There was no ST segment deviation noted during stress.  Findings consistent with prior moderate inferior myocardial infarction with mild peri-infarct ischemia.  The left ventricular ejection fraction is normal (55-65%).  This is a low risk study.  Assessment and Plan:   1. CAD in native artery Patient denies any current anginal or exertional symptoms.  Not on aspirin due to history of significant anemia.  2. Essential hypertension Blood pressure today 142/78.  Previous blood pressures in epic have been within normal limits.  Continue amlodipine 2.5 mg, valsartan 40 mg daily.  3. Hyperlipidemia LDL goal <70 Continue Crestor 20 mg daily.Recent lipid panel at PCP office showed a total cholesterol of 94, triglycerides 236, HDL 33, LDL 25.  4. Anemia, unspecified type Patient has chronic anemia.  Recent hemoglobin and hematocrit on 11/25/2019 showed 9.6 and 29.9.Marland Kitchen Previous CBC at PCP office on 05/30/2019 hemoglobin 10.5 and hematocrit 32.5.  A CBC was ordered at last visit but not done.  Patient has a pending appointment with PCP.  Advised patient to have a CBC done in his office.  5.  Fatigue/low energy Patient complaining of significant fatigue labs were drawn on 12/15/2019 vitamin D was 42.2.  TSH was 5.381, T3 free was 3.0, free T4 0.83, sodium was 137, potassium 4, glucose 117  Medication Adjustments/Labs and Tests Ordered: Current medicines are reviewed at length with the patient today.  Concerns regarding medicines are outlined above.    Disposition: Follow-up with Dr. Domenic Polite or APP 6 months   signed, Levell July, NP 03/29/2020 10:09 AM    [facility] [place], [place], [place] [address] Phone: [phone]; Fax: [phone]

## 2020-03-29 ENCOUNTER — Ambulatory Visit (INDEPENDENT_AMBULATORY_CARE_PROVIDER_SITE_OTHER): Payer: Medicare HMO | Admitting: Family Medicine

## 2020-03-29 ENCOUNTER — Encounter: Payer: Self-pay | Admitting: Family Medicine

## 2020-03-29 ENCOUNTER — Ambulatory Visit: Payer: Medicare Other | Admitting: Cardiovascular Disease

## 2020-03-29 VITALS — BP 140/78 | HR 71 | Ht 65.0 in | Wt 154.0 lb

## 2020-03-29 DIAGNOSIS — R5383 Other fatigue: Secondary | ICD-10-CM | POA: Diagnosis not present

## 2020-03-29 DIAGNOSIS — D649 Anemia, unspecified: Secondary | ICD-10-CM

## 2020-03-29 DIAGNOSIS — E782 Mixed hyperlipidemia: Secondary | ICD-10-CM | POA: Diagnosis not present

## 2020-03-29 DIAGNOSIS — I2510 Atherosclerotic heart disease of native coronary artery without angina pectoris: Secondary | ICD-10-CM

## 2020-03-29 DIAGNOSIS — I10 Essential (primary) hypertension: Secondary | ICD-10-CM

## 2020-03-29 NOTE — Patient Instructions (Signed)
Medication Instructions:  Continue all current medications.   Labwork: none  Testing/Procedures: none  Follow-Up: 6 months   Any Other Special Instructions Will Be Listed Below (If Applicable).   If you need a refill on your cardiac medications before your next appointment, please call your pharmacy.  

## 2020-04-02 DIAGNOSIS — E119 Type 2 diabetes mellitus without complications: Secondary | ICD-10-CM | POA: Diagnosis not present

## 2020-04-02 DIAGNOSIS — H25813 Combined forms of age-related cataract, bilateral: Secondary | ICD-10-CM | POA: Diagnosis not present

## 2020-04-02 DIAGNOSIS — H5203 Hypermetropia, bilateral: Secondary | ICD-10-CM | POA: Diagnosis not present

## 2020-04-02 DIAGNOSIS — Z7984 Long term (current) use of oral hypoglycemic drugs: Secondary | ICD-10-CM | POA: Diagnosis not present

## 2020-04-02 DIAGNOSIS — H524 Presbyopia: Secondary | ICD-10-CM | POA: Diagnosis not present

## 2020-04-08 DIAGNOSIS — I10 Essential (primary) hypertension: Secondary | ICD-10-CM | POA: Diagnosis not present

## 2020-04-08 DIAGNOSIS — E1165 Type 2 diabetes mellitus with hyperglycemia: Secondary | ICD-10-CM | POA: Diagnosis not present

## 2020-04-08 DIAGNOSIS — J449 Chronic obstructive pulmonary disease, unspecified: Secondary | ICD-10-CM | POA: Diagnosis not present

## 2020-04-08 DIAGNOSIS — E119 Type 2 diabetes mellitus without complications: Secondary | ICD-10-CM | POA: Diagnosis not present

## 2020-04-08 DIAGNOSIS — Z299 Encounter for prophylactic measures, unspecified: Secondary | ICD-10-CM | POA: Diagnosis not present

## 2020-04-12 DIAGNOSIS — L570 Actinic keratosis: Secondary | ICD-10-CM | POA: Diagnosis not present

## 2020-04-27 DIAGNOSIS — Z23 Encounter for immunization: Secondary | ICD-10-CM | POA: Diagnosis not present

## 2020-04-28 ENCOUNTER — Other Ambulatory Visit: Payer: Self-pay | Admitting: *Deleted

## 2020-04-28 MED ORDER — ROSUVASTATIN CALCIUM 20 MG PO TABS: 20.0000 mg | ORAL_TABLET | Freq: Every day | ORAL | 1 refills | Status: DC

## 2020-05-07 DIAGNOSIS — E119 Type 2 diabetes mellitus without complications: Secondary | ICD-10-CM | POA: Diagnosis not present

## 2020-05-07 DIAGNOSIS — I10 Essential (primary) hypertension: Secondary | ICD-10-CM | POA: Diagnosis not present

## 2020-05-11 ENCOUNTER — Other Ambulatory Visit (INDEPENDENT_AMBULATORY_CARE_PROVIDER_SITE_OTHER): Payer: Medicare Other

## 2020-05-11 ENCOUNTER — Encounter: Payer: Self-pay | Admitting: Pulmonary Disease

## 2020-05-11 ENCOUNTER — Ambulatory Visit (INDEPENDENT_AMBULATORY_CARE_PROVIDER_SITE_OTHER): Payer: Medicare Other | Admitting: Pulmonary Disease

## 2020-05-11 ENCOUNTER — Ambulatory Visit (INDEPENDENT_AMBULATORY_CARE_PROVIDER_SITE_OTHER): Payer: Medicare Other

## 2020-05-11 ENCOUNTER — Other Ambulatory Visit: Payer: Self-pay

## 2020-05-11 VITALS — BP 138/72 | HR 94 | Temp 99.0°F | Ht 65.0 in | Wt 154.6 lb

## 2020-05-11 DIAGNOSIS — I2510 Atherosclerotic heart disease of native coronary artery without angina pectoris: Secondary | ICD-10-CM | POA: Diagnosis not present

## 2020-05-11 DIAGNOSIS — R059 Cough, unspecified: Secondary | ICD-10-CM

## 2020-05-11 DIAGNOSIS — J4540 Moderate persistent asthma, uncomplicated: Secondary | ICD-10-CM | POA: Diagnosis not present

## 2020-05-11 DIAGNOSIS — J439 Emphysema, unspecified: Secondary | ICD-10-CM | POA: Diagnosis not present

## 2020-05-11 LAB — CBC WITH DIFFERENTIAL/PLATELET
Basophils Absolute: 0.1 10*3/uL (ref 0.0–0.1)
Basophils Relative: 1.2 % (ref 0.0–3.0)
Eosinophils Absolute: 0.6 10*3/uL (ref 0.0–0.7)
Eosinophils Relative: 8.1 % — ABNORMAL HIGH (ref 0.0–5.0)
HCT: 36.6 % — ABNORMAL LOW (ref 39.0–52.0)
Hemoglobin: 12.4 g/dL — ABNORMAL LOW (ref 13.0–17.0)
Lymphocytes Relative: 11.2 % — ABNORMAL LOW (ref 12.0–46.0)
Lymphs Abs: 0.8 10*3/uL (ref 0.7–4.0)
MCHC: 34 g/dL (ref 30.0–36.0)
MCV: 87.2 fl (ref 78.0–100.0)
Monocytes Absolute: 0.6 10*3/uL (ref 0.1–1.0)
Monocytes Relative: 8 % (ref 3.0–12.0)
Neutro Abs: 5.4 10*3/uL (ref 1.4–7.7)
Neutrophils Relative %: 71.5 % (ref 43.0–77.0)
Platelets: 241 10*3/uL (ref 150.0–400.0)
RBC: 4.2 Mil/uL — ABNORMAL LOW (ref 4.22–5.81)
RDW: 13.3 % (ref 11.5–15.5)
WBC: 7.6 10*3/uL (ref 4.0–10.5)

## 2020-05-11 MED ORDER — AZITHROMYCIN 250 MG PO TABS
ORAL_TABLET | ORAL | 0 refills | Status: DC
Start: 2020-05-11 — End: 2020-09-30

## 2020-05-11 MED ORDER — TRELEGY ELLIPTA 100-62.5-25 MCG/INH IN AEPB
1.0000 | INHALATION_SPRAY | Freq: Every day | RESPIRATORY_TRACT | 0 refills | Status: DC
Start: 2020-05-11 — End: 2020-08-31

## 2020-05-11 NOTE — Patient Instructions (Addendum)
We will get a chest x-ray today, CBC differential and IgE We will give a Z-Pak Start using the duo nebs 3-4 times a day for wheezing Continue Trelegy inhaler  Follow-up in 3 months.

## 2020-05-11 NOTE — Progress Notes (Signed)
Travis Beck    [id number]    Mar 12, 1946  Primary Care Physician:Shah, Weldon Picking, MD  Referring Physician: Monico Blitz, [address] [place],  [place] [address]  Chief complaint: Follow up for asthma, emphysema, chronic bronchitis, abnormal CT scan  HPI: 74 year old with history of allergic rhinitis, hyperlipidemia, type 2 diabetes, former smoker Treated for pneumonia in May-June 2019.  He required 3 rounds of antibiotic to finally get symptoms under control.  He had imaging including CT of the chest which showed bilateral infiltrates, nodular opacities and has been referred here for further evaluation.  Follow-up CT shows resolution of infiltrate.  Evaluated by Dr. Ernst Bowler, allergy Also follows with Dr. Melony Overly for esophagitis, benign stricture and Schatzki's ring  Pets: Has a dog, no cats, birds, farm animals Occupation: Used to work from [employer].  Currently retired Exposures: No known exposures, no leak, hot tub, Jacuzzi, mold Smoking history: 35-pack-year smoker.  Quit in 1994 Travel history: No significant travel.  Interim history: Continues to have recurrent episodes of dyspnea. He got flu shot last week and has increased chest congestion, wheezing since then Currently on Trelegy inhaler which he is using regularly.  Outpatient Encounter Medications as of 05/11/2020  Medication Sig  . acetaminophen (TYLENOL) 325 MG tablet Take 2 tablets (650 mg total) by mouth every 6 (six) hours as needed for mild pain (or Fever >/= 101).  Marland Kitchen albuterol (VENTOLIN HFA) 108 (90 Base) MCG/ACT inhaler Inhale 2 puffs into the lungs every 4 (four) hours as needed for wheezing or shortness of breath.  . ALPRAZolam (XANAX) 1 MG tablet Take 1 tablet (1 mg total) by mouth 3 (three) times daily as needed for anxiety.  Marland Kitchen amLODipine (NORVASC) 2.5 MG tablet Take 1 tablet (2.5 mg total) by mouth daily. For BP  . buprenorphine (SUBUTEX) 8 MG SUBL SL tablet Place 8 mg under the tongue  daily.   . cetirizine (ZYRTEC) 10 MG tablet Take 10 mg by mouth as needed for allergies (itching).  . fluticasone (FLONASE) 50 MCG/ACT nasal spray Place 2 sprays into both nostrils daily.   . Fluticasone-Umeclidin-Vilant (TRELEGY ELLIPTA) 100-62.5-25 MCG/INH AEPB Inhale 1 puff into the lungs daily.  Marland Kitchen gabapentin (NEURONTIN) 400 MG capsule Take 1 capsule (400 mg total) by mouth every 8 (eight) hours as needed.  . glyBURIDE (DIABETA) 5 MG tablet Take 10 mg by mouth 2 (two) times daily with a meal.   . guaiFENesin (MUCINEX) 600 MG 12 hr tablet Take 1 tablet (600 mg total) by mouth 2 (two) times daily.  Marland Kitchen ipratropium-albuterol (DUONEB) 0.5-2.5 (3) MG/3ML SOLN Inhale 3 mLs into the lungs every 6 (six) hours as needed.  Marland Kitchen levocetirizine (XYZAL) 5 MG tablet Take 1 tablet by mouth. occasionally  . metFORMIN (GLUCOPHAGE) 1000 MG tablet Take 1,000 mg by mouth 2 (two) times daily with a meal.   . pantoprazole (PROTONIX) 40 MG tablet TAKE 1 TABLET BY MOUTH TWICE DAILY BEFORE A MEAL (Patient taking differently: Take 40 mg by mouth 2 (two) times daily. )  . polyethylene glycol (MIRALAX / GLYCOLAX) 17 g packet Take 17 g by mouth 2 (two) times daily.  . rosuvastatin (CRESTOR) 20 MG tablet Take 1 tablet (20 mg total) by mouth daily.  Marland Kitchen triamcinolone cream (KENALOG) 0.1 % Apply 1 application topically daily as needed.  . valsartan (DIOVAN) 40 MG tablet Take 1 tablet (40 mg total) by mouth daily. For Heart and blood pressure   No facility-administered encounter  medications on file as of 05/11/2020.   Physical Exam: Blood pressure 138/72, pulse 94, temperature 99 F (37.2 C), temperature source Skin, height 5\' 5"  (1.651 m), weight 154 lb 9.6 oz (70.1 kg), SpO2 95 %. Gen:      No acute distress HEENT:  EOMI, sclera anicteric Neck:     No masses; no thyromegaly Lungs:    Clear to auscultation bilaterally; normal respiratory effort CV:         Regular rate and rhythm; no murmurs Abd:      + bowel sounds; soft,  non-tender; no palpable masses, no distension Ext:    No edema; adequate peripheral perfusion Skin:      Warm and dry; no rash Neuro: alert and oriented x 3 Psych: normal mood and affect  Data Reviewed: Imaging CT chest 12/21/2017- bilateral peribronchovascular nodularity, groundglass opacities left greater than right.  Dominant 1.1 cm left upper lobe nodule.  Mild centrilobular, paraseptal emphysema.  Left main and three-vessel coronary atherosclerosis.  Small hiatal hernia.    CT chest 04/25/2018- improvement in left upper lobe patchy airspace opacities, mild tree-in-bud in the upper lobes and lower lobes.  Peripheral subcentimeter pulmonary nodules.,  Aortic atherosclerosis  CT scan 04/22/2019-scattered subcentimeter nodules, tree-in-bud opacities.  Multiple patchy areas of groundglass in the upper lobe.   Chest x-ray 11/27/2019-mild bibasal opacities I have reviewed the images personally.  PFTs 04/17/2018 FVC 3.20 [92%), FEV1 2.45 [98%), F/F 77, TLC 125%, RV/DL 361%, DLCO 69% No obstruction, air trapping Mild reduction diffusion capacity.  Labs: CBC 02/18/2019-WBC 5.7, eos 25.2%, absolute eosinophil count 1436 Respiratory allergy profile-IgE 117, RAST panel- Negative Alpha-1 antitrypsin 02/18/2019-172, PI MM Mold profile 03/06/2019-negative  Sputum cultures 11/6-20-negative  Procedures: EGD 05/19/2019- - Scar in the distal esophagus. Healed esophagitis. - Benign-appearing esophageal stenosis proximal to GEJ. Dilated. - Non-obstructing Schatzki ring. - 3 cm hiatal hernia. - Normal stomach. - Normal duodenal bulb and second portion of the duodenum.   Assessment:  Asthma, emphysema, chronic bronchitis with exacerbation PFTs did not show any significant obstruction but he does have emphysema and air trapping.  Has mild exacerbation today with wheezing Continue Trelegy inhaler Check CBC differential, IgE Z-Pak He would like to avoid prednisone as it increases the blood  sugar Advised him to use duo nebs up to 4 times a day  He has elevated peripheral eosinophils in the past.  He may be a candidate for Biologics.  Will reassess at return visit.  Chronic rhinitis, allergies Follows with Dr. Ernst Bowler  GERD, esophagitis, esophageal stricture Suspect significant contribution from GERD to ongoing cough Follows with GI and is on PPI therapy.  Health maintenance Up-to-date with Pneumovax, flu Has been vaccinated against Covid.  He is planning on getting the booster soon.  Plan/Recommendations: - Trelegy, albuterol as needed - Increase duo nebs to 3-4 times a day - Z-Pak - Repeat CBC, chest x-ray, IgE   Marshell Garfinkel MD [facility] 05/11/2020, 8:23 AM  CC: Monico Blitz, MD

## 2020-05-11 NOTE — Addendum Note (Signed)
Addended by: Elton Sin on: 05/11/2020 09:26 AM   Modules accepted: Orders

## 2020-05-12 LAB — IGE: IgE (Immunoglobulin E), Serum: 160 kU/L — ABNORMAL HIGH (ref <=114)

## 2020-05-19 DIAGNOSIS — Z299 Encounter for prophylactic measures, unspecified: Secondary | ICD-10-CM | POA: Diagnosis not present

## 2020-05-19 DIAGNOSIS — J449 Chronic obstructive pulmonary disease, unspecified: Secondary | ICD-10-CM | POA: Diagnosis not present

## 2020-05-19 DIAGNOSIS — J069 Acute upper respiratory infection, unspecified: Secondary | ICD-10-CM | POA: Diagnosis not present

## 2020-05-19 DIAGNOSIS — E1165 Type 2 diabetes mellitus with hyperglycemia: Secondary | ICD-10-CM | POA: Diagnosis not present

## 2020-05-19 DIAGNOSIS — I10 Essential (primary) hypertension: Secondary | ICD-10-CM | POA: Diagnosis not present

## 2020-05-19 DIAGNOSIS — Z87891 Personal history of nicotine dependence: Secondary | ICD-10-CM | POA: Diagnosis not present

## 2020-05-23 ENCOUNTER — Other Ambulatory Visit: Payer: Self-pay | Admitting: Pulmonary Disease

## 2020-05-24 DIAGNOSIS — I10 Essential (primary) hypertension: Secondary | ICD-10-CM | POA: Diagnosis not present

## 2020-05-24 DIAGNOSIS — J449 Chronic obstructive pulmonary disease, unspecified: Secondary | ICD-10-CM | POA: Diagnosis not present

## 2020-05-24 DIAGNOSIS — E1165 Type 2 diabetes mellitus with hyperglycemia: Secondary | ICD-10-CM | POA: Diagnosis not present

## 2020-05-24 DIAGNOSIS — Z299 Encounter for prophylactic measures, unspecified: Secondary | ICD-10-CM | POA: Diagnosis not present

## 2020-05-27 DIAGNOSIS — Z79891 Long term (current) use of opiate analgesic: Secondary | ICD-10-CM | POA: Diagnosis not present

## 2020-05-27 DIAGNOSIS — M130 Polyarthritis, unspecified: Secondary | ICD-10-CM | POA: Diagnosis not present

## 2020-05-27 DIAGNOSIS — M25569 Pain in unspecified knee: Secondary | ICD-10-CM | POA: Diagnosis not present

## 2020-05-27 DIAGNOSIS — F419 Anxiety disorder, unspecified: Secondary | ICD-10-CM | POA: Diagnosis not present

## 2020-05-27 DIAGNOSIS — M542 Cervicalgia: Secondary | ICD-10-CM | POA: Diagnosis not present

## 2020-05-27 DIAGNOSIS — M545 Low back pain, unspecified: Secondary | ICD-10-CM | POA: Diagnosis not present

## 2020-06-01 DIAGNOSIS — E785 Hyperlipidemia, unspecified: Secondary | ICD-10-CM | POA: Diagnosis not present

## 2020-06-01 DIAGNOSIS — Z0000 Encounter for general adult medical examination without abnormal findings: Secondary | ICD-10-CM | POA: Diagnosis not present

## 2020-06-01 DIAGNOSIS — I10 Essential (primary) hypertension: Secondary | ICD-10-CM | POA: Diagnosis not present

## 2020-06-01 DIAGNOSIS — Z7189 Other specified counseling: Secondary | ICD-10-CM | POA: Diagnosis not present

## 2020-06-01 DIAGNOSIS — Z299 Encounter for prophylactic measures, unspecified: Secondary | ICD-10-CM | POA: Diagnosis not present

## 2020-06-01 DIAGNOSIS — Z6825 Body mass index (BMI) 25.0-25.9, adult: Secondary | ICD-10-CM | POA: Diagnosis not present

## 2020-06-01 DIAGNOSIS — Z1331 Encounter for screening for depression: Secondary | ICD-10-CM | POA: Diagnosis not present

## 2020-06-01 DIAGNOSIS — E1165 Type 2 diabetes mellitus with hyperglycemia: Secondary | ICD-10-CM | POA: Diagnosis not present

## 2020-06-01 DIAGNOSIS — Z125 Encounter for screening for malignant neoplasm of prostate: Secondary | ICD-10-CM | POA: Diagnosis not present

## 2020-06-01 DIAGNOSIS — J449 Chronic obstructive pulmonary disease, unspecified: Secondary | ICD-10-CM | POA: Diagnosis not present

## 2020-06-01 DIAGNOSIS — Z87891 Personal history of nicotine dependence: Secondary | ICD-10-CM | POA: Diagnosis not present

## 2020-06-01 DIAGNOSIS — Z1339 Encounter for screening examination for other mental health and behavioral disorders: Secondary | ICD-10-CM | POA: Diagnosis not present

## 2020-06-01 DIAGNOSIS — R5383 Other fatigue: Secondary | ICD-10-CM | POA: Diagnosis not present

## 2020-06-08 DIAGNOSIS — I10 Essential (primary) hypertension: Secondary | ICD-10-CM | POA: Diagnosis not present

## 2020-06-08 DIAGNOSIS — E119 Type 2 diabetes mellitus without complications: Secondary | ICD-10-CM | POA: Diagnosis not present

## 2020-06-16 DIAGNOSIS — Z299 Encounter for prophylactic measures, unspecified: Secondary | ICD-10-CM | POA: Diagnosis not present

## 2020-06-16 DIAGNOSIS — I10 Essential (primary) hypertension: Secondary | ICD-10-CM | POA: Diagnosis not present

## 2020-06-16 DIAGNOSIS — J449 Chronic obstructive pulmonary disease, unspecified: Secondary | ICD-10-CM | POA: Diagnosis not present

## 2020-06-16 DIAGNOSIS — E1165 Type 2 diabetes mellitus with hyperglycemia: Secondary | ICD-10-CM | POA: Diagnosis not present

## 2020-06-16 DIAGNOSIS — F419 Anxiety disorder, unspecified: Secondary | ICD-10-CM | POA: Diagnosis not present

## 2020-06-22 DIAGNOSIS — Z23 Encounter for immunization: Secondary | ICD-10-CM | POA: Diagnosis not present

## 2020-06-28 DIAGNOSIS — F329 Major depressive disorder, single episode, unspecified: Secondary | ICD-10-CM | POA: Diagnosis not present

## 2020-07-08 DIAGNOSIS — Z79899 Other long term (current) drug therapy: Secondary | ICD-10-CM | POA: Diagnosis not present

## 2020-07-08 DIAGNOSIS — J449 Chronic obstructive pulmonary disease, unspecified: Secondary | ICD-10-CM | POA: Diagnosis not present

## 2020-07-08 DIAGNOSIS — K219 Gastro-esophageal reflux disease without esophagitis: Secondary | ICD-10-CM | POA: Diagnosis not present

## 2020-07-08 DIAGNOSIS — R069 Unspecified abnormalities of breathing: Secondary | ICD-10-CM | POA: Diagnosis not present

## 2020-07-08 DIAGNOSIS — Z880 Allergy status to penicillin: Secondary | ICD-10-CM | POA: Diagnosis not present

## 2020-07-08 DIAGNOSIS — R059 Cough, unspecified: Secondary | ICD-10-CM | POA: Diagnosis not present

## 2020-07-08 DIAGNOSIS — R1310 Dysphagia, unspecified: Secondary | ICD-10-CM | POA: Diagnosis not present

## 2020-07-08 DIAGNOSIS — I10 Essential (primary) hypertension: Secondary | ICD-10-CM | POA: Diagnosis not present

## 2020-07-08 DIAGNOSIS — Z87891 Personal history of nicotine dependence: Secondary | ICD-10-CM | POA: Diagnosis not present

## 2020-07-08 DIAGNOSIS — R070 Pain in throat: Secondary | ICD-10-CM | POA: Diagnosis not present

## 2020-07-08 DIAGNOSIS — E119 Type 2 diabetes mellitus without complications: Secondary | ICD-10-CM | POA: Diagnosis not present

## 2020-07-08 DIAGNOSIS — J029 Acute pharyngitis, unspecified: Secondary | ICD-10-CM | POA: Diagnosis not present

## 2020-07-08 DIAGNOSIS — R0902 Hypoxemia: Secondary | ICD-10-CM | POA: Diagnosis not present

## 2020-07-15 DIAGNOSIS — Z87891 Personal history of nicotine dependence: Secondary | ICD-10-CM | POA: Diagnosis not present

## 2020-07-15 DIAGNOSIS — E1165 Type 2 diabetes mellitus with hyperglycemia: Secondary | ICD-10-CM | POA: Diagnosis not present

## 2020-07-15 DIAGNOSIS — J449 Chronic obstructive pulmonary disease, unspecified: Secondary | ICD-10-CM | POA: Diagnosis not present

## 2020-07-15 DIAGNOSIS — Z299 Encounter for prophylactic measures, unspecified: Secondary | ICD-10-CM | POA: Diagnosis not present

## 2020-07-15 DIAGNOSIS — J441 Chronic obstructive pulmonary disease with (acute) exacerbation: Secondary | ICD-10-CM | POA: Diagnosis not present

## 2020-07-15 DIAGNOSIS — I10 Essential (primary) hypertension: Secondary | ICD-10-CM | POA: Diagnosis not present

## 2020-07-15 DIAGNOSIS — Z6825 Body mass index (BMI) 25.0-25.9, adult: Secondary | ICD-10-CM | POA: Diagnosis not present

## 2020-08-16 DIAGNOSIS — E1165 Type 2 diabetes mellitus with hyperglycemia: Secondary | ICD-10-CM | POA: Diagnosis not present

## 2020-08-16 DIAGNOSIS — Z299 Encounter for prophylactic measures, unspecified: Secondary | ICD-10-CM | POA: Diagnosis not present

## 2020-08-16 DIAGNOSIS — I10 Essential (primary) hypertension: Secondary | ICD-10-CM | POA: Diagnosis not present

## 2020-08-16 DIAGNOSIS — J449 Chronic obstructive pulmonary disease, unspecified: Secondary | ICD-10-CM | POA: Diagnosis not present

## 2020-08-16 DIAGNOSIS — J441 Chronic obstructive pulmonary disease with (acute) exacerbation: Secondary | ICD-10-CM | POA: Diagnosis not present

## 2020-08-19 DIAGNOSIS — F419 Anxiety disorder, unspecified: Secondary | ICD-10-CM | POA: Diagnosis not present

## 2020-08-19 DIAGNOSIS — M545 Low back pain, unspecified: Secondary | ICD-10-CM | POA: Diagnosis not present

## 2020-08-19 DIAGNOSIS — M25569 Pain in unspecified knee: Secondary | ICD-10-CM | POA: Diagnosis not present

## 2020-08-19 DIAGNOSIS — Z79891 Long term (current) use of opiate analgesic: Secondary | ICD-10-CM | POA: Diagnosis not present

## 2020-08-19 DIAGNOSIS — M130 Polyarthritis, unspecified: Secondary | ICD-10-CM | POA: Diagnosis not present

## 2020-08-19 DIAGNOSIS — M542 Cervicalgia: Secondary | ICD-10-CM | POA: Diagnosis not present

## 2020-08-21 ENCOUNTER — Other Ambulatory Visit (INDEPENDENT_AMBULATORY_CARE_PROVIDER_SITE_OTHER): Payer: Self-pay | Admitting: Internal Medicine

## 2020-08-31 ENCOUNTER — Ambulatory Visit (INDEPENDENT_AMBULATORY_CARE_PROVIDER_SITE_OTHER): Payer: Medicare Other | Admitting: Pulmonary Disease

## 2020-08-31 ENCOUNTER — Other Ambulatory Visit: Payer: Self-pay

## 2020-08-31 ENCOUNTER — Encounter: Payer: Self-pay | Admitting: Pulmonary Disease

## 2020-08-31 VITALS — BP 118/66 | HR 76 | Temp 98.5°F | Ht 65.0 in | Wt 151.0 lb

## 2020-08-31 DIAGNOSIS — J849 Interstitial pulmonary disease, unspecified: Secondary | ICD-10-CM | POA: Diagnosis not present

## 2020-08-31 DIAGNOSIS — R0602 Shortness of breath: Secondary | ICD-10-CM | POA: Diagnosis not present

## 2020-08-31 MED ORDER — TRELEGY ELLIPTA 100-62.5-25 MCG/INH IN AEPB
1.0000 | INHALATION_SPRAY | Freq: Every day | RESPIRATORY_TRACT | 0 refills | Status: DC
Start: 2020-08-31 — End: 2021-04-05

## 2020-08-31 NOTE — Progress Notes (Signed)
Travis Beck    [id number]    05-24-1946  Primary Care Physician:Shah, Weldon Picking, MD  Referring Physician: Monico Blitz, [place] [address],  [place] [address]  Chief complaint: Follow up for asthma, emphysema, chronic bronchitis, abnormal CT scan  HPI: 75 year old with history of allergic rhinitis, hyperlipidemia, type 2 diabetes, former smoker Treated for pneumonia in May-June 2019.  He required 3 rounds of antibiotic to finally get symptoms under control.  He had imaging including CT of the chest which showed bilateral infiltrates, nodular opacities and has been referred here for further evaluation.  Follow-up CT shows resolution of infiltrate.  Evaluated by Dr. Ernst Bowler, allergy Also follows with Dr. Melony Overly for esophagitis, benign stricture and Schatzki's ring  Pets: Has a dog, no cats, birds, farm animals Occupation: Used to work from [employer].  Currently retired Exposures: No known exposures, no leak, hot tub, Jacuzzi, mold Smoking history: 35-pack-year smoker.  Quit in 1994 Travel history: No significant travel.  Interim history: Received 3 rounds of antibiotics last fall for bronchitis.  He was evaluated in the ED with negative Covid test  Overall he is recovered and is feeling well.  Continues on Trelegy inhaler.  Outpatient Encounter Medications as of 08/31/2020  Medication Sig  . acetaminophen (TYLENOL) 325 MG tablet Take 2 tablets (650 mg total) by mouth every 6 (six) hours as needed for mild pain (or Fever >/= 101).  Marland Kitchen albuterol (VENTOLIN HFA) 108 (90 Base) MCG/ACT inhaler Inhale 2 puffs into the lungs every 4 (four) hours as needed for wheezing or shortness of breath.  . ALPRAZolam (XANAX) 1 MG tablet Take 1 tablet (1 mg total) by mouth 3 (three) times daily as needed for anxiety.  Marland Kitchen amLODipine (NORVASC) 2.5 MG tablet Take 1 tablet (2.5 mg total) by mouth daily. For BP  . azithromycin (ZITHROMAX) 250 MG tablet Take as directed  . buprenorphine  (SUBUTEX) 8 MG SUBL SL tablet Place 8 mg under the tongue daily.  . cetirizine (ZYRTEC) 10 MG tablet Take 10 mg by mouth as needed for allergies (itching).  . fluticasone (FLONASE) 50 MCG/ACT nasal spray Place 2 sprays into both nostrils daily.   Marland Kitchen gabapentin (NEURONTIN) 400 MG capsule Take 1 capsule (400 mg total) by mouth every 8 (eight) hours as needed.  . glyBURIDE (DIABETA) 5 MG tablet Take 10 mg by mouth 2 (two) times daily with a meal.   . guaiFENesin (MUCINEX) 600 MG 12 hr tablet Take 1 tablet (600 mg total) by mouth 2 (two) times daily.  Marland Kitchen ipratropium-albuterol (DUONEB) 0.5-2.5 (3) MG/3ML SOLN Inhale 3 mLs into the lungs every 6 (six) hours as needed.  . metFORMIN (GLUCOPHAGE) 1000 MG tablet Take 1,000 mg by mouth 2 (two) times daily with a meal.   . pantoprazole (PROTONIX) 40 MG tablet TAKE 1 TABLET BY MOUTH TWICE DAILY BEFORE A MEAL  . polyethylene glycol (MIRALAX / GLYCOLAX) 17 g packet Take 17 g by mouth 2 (two) times daily.  . rosuvastatin (CRESTOR) 20 MG tablet Take 1 tablet (20 mg total) by mouth daily.  . TRELEGY ELLIPTA 100-62.5-25 MCG/INH AEPB inhale ONE PUFF into THE lungs DAILY  . triamcinolone cream (KENALOG) 0.1 % Apply 1 application topically daily as needed.  . valsartan (DIOVAN) 40 MG tablet Take 1 tablet (40 mg total) by mouth daily. For Heart and blood pressure  . [DISCONTINUED] Fluticasone-Umeclidin-Vilant (TRELEGY ELLIPTA) 100-62.5-25 MCG/INH AEPB Inhale 1 puff into the lungs daily.  Marland Kitchen levocetirizine (  XYZAL) 5 MG tablet Take 1 tablet by mouth. occasionally (Patient not taking: Reported on 08/31/2020)   No facility-administered encounter medications on file as of 08/31/2020.   Physical Exam: Blood pressure 118/66, pulse 76, temperature 98.5 F (36.9 C), temperature source Temporal, height 5\' 5"  (1.651 m), weight 151 lb (68.5 kg), SpO2 95 %. Gen:      No acute distress HEENT:  EOMI, sclera anicteric Neck:     No masses; no thyromegaly Lungs:    Clear to auscultation  bilaterally; normal respiratory effort CV:         Regular rate and rhythm; no murmurs Abd:      + bowel sounds; soft, non-tender; no palpable masses, no distension Ext:    No edema; adequate peripheral perfusion Skin:      Warm and dry; no rash Neuro: alert and oriented x 3 Psych: normal mood and affect  Data Reviewed: Imaging CT chest 12/21/2017- bilateral peribronchovascular nodularity, groundglass opacities left greater than right.  Dominant 1.1 cm left upper lobe nodule.  Mild centrilobular, paraseptal emphysema.  Left main and three-vessel coronary atherosclerosis.  Small hiatal hernia.    CT chest 04/25/2018- improvement in left upper lobe patchy airspace opacities, mild tree-in-bud in the upper lobes and lower lobes.  Peripheral subcentimeter pulmonary nodules.,  Aortic atherosclerosis  CT scan 04/22/2019-scattered subcentimeter nodules, tree-in-bud opacities.  Multiple patchy areas of groundglass in the upper lobe.   Chest x-ray 05/11/2020-mild bronchitis changes. I have reviewed the images personally.   PFTs 04/17/2018 FVC 3.20 [92%), FEV1 2.45 [98%), F/F 77, TLC 125%, RV/DL 361%, DLCO 69% No obstruction, air trapping Mild reduction diffusion capacity.  ACT score 08/31/20- 17  Labs: CBC 05/11/2020-WBC 7.6, eos 8.1%, absolute eosinophil count 616 IgE 06/05/2020-160  Respiratory allergy profile-IgE 117, RAST panel- Negative Alpha-1 antitrypsin 02/18/2019-172, PI MM Mold profile 03/06/2019-negative  Sputum cultures 11/6-20-negative  Procedures: EGD 05/19/2019- - Scar in the distal esophagus. Healed esophagitis. - Benign-appearing esophageal stenosis proximal to GEJ. Dilated. - Non-obstructing Schatzki ring. - 3 cm hiatal hernia. - Normal stomach. - Normal duodenal bulb and second portion of the duodenum.   Assessment:  Asthma, emphysema, chronic bronchitis with exacerbation PFTs did not show any significant obstruction but he does have emphysema and air  trapping.  Continue Trelegy inhaler He has elevated peripheral eosinophils in the past.  He may be a candidate for biologics if there is worsening of symptoms..   Prior CTs show vaccine waning pulmonary infiltrates with groundglass opacities, he does not have significant exposure history.  Will get high-res CT to evaluate for eosinophilic pneumonia.  Chronic rhinitis, allergies Follows with Dr. Ernst Bowler  GERD, esophagitis, esophageal stricture Suspect significant contribution from GERD to ongoing cough Follows with GI and is on PPI therapy.  Health maintenance Up-to-date with Pneumovax, flu Has been vaccinated against Covid.  He is planning on getting the booster soon.  Plan/Recommendations: - Trelegy, albuterol as needed - High-res CT - PFTs and follow-up in 6 months.   Marshell Garfinkel MD [facility] 08/31/2020, 9:10 AM  CC: Monico Blitz, MD

## 2020-08-31 NOTE — Patient Instructions (Signed)
We will schedule a high-resolution CT at next available in [place] Schedule PFTs in 6 months and follow-up in clinic after PFTs.

## 2020-08-31 NOTE — Addendum Note (Signed)
Addended by: Elton Sin on: 08/31/2020 09:24 AM   Modules accepted: Orders

## 2020-09-23 ENCOUNTER — Ambulatory Visit (HOSPITAL_COMMUNITY)
Admission: RE | Admit: 2020-09-23 | Discharge: 2020-09-23 | Disposition: A | Discharge status: Home / Self Care | Payer: Medicare Other | Source: Ambulatory Visit | Attending: Pulmonary Disease | Admitting: Pulmonary Disease

## 2020-09-23 ENCOUNTER — Other Ambulatory Visit: Payer: Self-pay

## 2020-09-23 DIAGNOSIS — R0602 Shortness of breath: Secondary | ICD-10-CM | POA: Diagnosis not present

## 2020-09-23 DIAGNOSIS — J849 Interstitial pulmonary disease, unspecified: Secondary | ICD-10-CM | POA: Diagnosis not present

## 2020-09-29 ENCOUNTER — Encounter: Payer: Self-pay | Admitting: Cardiology

## 2020-09-29 NOTE — Progress Notes (Unsigned)
Cardiology Office Note  Date: 09/30/2020   ID: Travis Beck 1945-11-22, MRN [id number]  PCP:  Monico Blitz, MD  Cardiologist:  Rozann Lesches, MD Electrophysiologist:  None   Chief Complaint  Patient presents with  . Cardiac follow-up    History of Present Illness: Travis Beck is a 75 y.o. male former patient of Dr. Bronson Ing now presenting to establish follow-up with me.  I reviewed his records and updated the chart.  He was last seen in September 2021 by Mr. Leonides Sake NP.  He presents for a routine visit, reports occasional nonexertional chest pains, no progressive pattern or functional limitations specifically.  He follows in the Pulmonary clinic with Dr. Vaughan Browner, I reviewed the recent note.  I reviewed his medications which are outlined below.  Current cardiac regimen includes Norvasc, Diovan, and Crestor.  We discussed adding a low-dose coated aspirin daily.  Also requesting most recent lab work from PCP.  I personally reviewed his ECG today which shows sinus rhythm with old inferior infarct pattern.   Past Medical History:  Diagnosis Date  . Arthritis   . Asthma    Childhood  . Emphysema lung ([place])   . Essential hypertension   . Ischemic heart disease    Myoview 2020 indicating inferior infarct scar with mild peri-infarct ischemia - managed medically  . Type 2 diabetes mellitus ([place])     Past Surgical History:  Procedure Laterality Date  . BIOPSY  08/30/2018   Procedure: BIOPSY;  Surgeon: Rogene Houston, MD;  [facility]: [facility];  Service: Endoscopy;;  gastric   . COLONOSCOPY    . ESOPHAGEAL DILATION N/A 05/21/2019   Procedure: ESOPHAGEAL DILATION;  Surgeon: Rogene Houston, MD;  Location: [facility];  Service: Endoscopy;  Laterality: N/A;  . ESOPHAGOGASTRODUODENOSCOPY N/A 05/21/2019   Procedure: ESOPHAGOGASTRODUODENOSCOPY (EGD);  Surgeon: Rogene Houston, MD;  [facility]: [facility];  Service: Endoscopy;  Laterality: N/A;  730  .  ESOPHAGOGASTRODUODENOSCOPY (EGD) WITH PROPOFOL N/A 08/30/2018   Procedure: ESOPHAGOGASTRODUODENOSCOPY (EGD) WITH PROPOFOL;  Surgeon: Rogene Houston, MD;  Location: [facility];  Service: Endoscopy;  Laterality: N/A;  1:30  . HERNIA REPAIR     bilateral lower abdomen    Current Outpatient Medications  Medication Sig Dispense Refill  . acetaminophen (TYLENOL) 325 MG tablet Take 2 tablets (650 mg total) by mouth every 6 (six) hours as needed for mild pain (or Fever >/= 101). 12 tablet 0  . albuterol (VENTOLIN HFA) 108 (90 Base) MCG/ACT inhaler Inhale 2 puffs into the lungs every 4 (four) hours as needed for wheezing or shortness of breath. 18 g 0  . ALPRAZolam (XANAX) 1 MG tablet Take 1 tablet (1 mg total) by mouth 3 (three) times daily as needed for anxiety. 30 tablet 0  . amLODipine (NORVASC) 2.5 MG tablet Take 1 tablet (2.5 mg total) by mouth daily. For BP 30 tablet 5  . buprenorphine (SUBUTEX) 8 MG SUBL SL tablet Place 8 mg under the tongue daily.    . cetirizine (ZYRTEC) 10 MG tablet Take 10 mg by mouth as needed for allergies (itching).    . fluticasone (FLONASE) 50 MCG/ACT nasal spray Place 2 sprays into both nostrils daily.     . Fluticasone-Umeclidin-Vilant (TRELEGY ELLIPTA) 100-62.5-25 MCG/INH AEPB Inhale 1 puff into the lungs daily. 14 each 0  . gabapentin (NEURONTIN) 400 MG capsule Take 1 capsule (400 mg total) by mouth every 8 (eight) hours as needed.  1  .  glyBURIDE (DIABETA) 5 MG tablet Take 10 mg by mouth 2 (two) times daily with a meal.     . guaiFENesin (MUCINEX) 600 MG 12 hr tablet Take 1 tablet (600 mg total) by mouth 2 (two) times daily. 20 tablet 2  . ipratropium-albuterol (DUONEB) 0.5-2.5 (3) MG/3ML SOLN Inhale 3 mLs into the lungs every 6 (six) hours as needed.    Marland Kitchen levocetirizine (XYZAL) 5 MG tablet Take 1 tablet by mouth. occasionally    . metFORMIN (GLUCOPHAGE) 1000 MG tablet Take 1,000 mg by mouth 2 (two) times daily with a meal.     . pantoprazole (PROTONIX) 40 MG  tablet TAKE 1 TABLET BY MOUTH TWICE DAILY BEFORE A MEAL 60 tablet 1  . polyethylene glycol (MIRALAX / GLYCOLAX) 17 g packet Take 17 g by mouth 2 (two) times daily. 60 each 1  . rosuvastatin (CRESTOR) 20 MG tablet Take 1 tablet (20 mg total) by mouth daily. 90 tablet 1  . TRELEGY ELLIPTA 100-62.5-25 MCG/INH AEPB inhale ONE PUFF into THE lungs DAILY 60 each 3  . triamcinolone cream (KENALOG) 0.1 % Apply 1 application topically daily as needed.    . valsartan (DIOVAN) 40 MG tablet Take 1 tablet (40 mg total) by mouth daily. For Heart and blood pressure 30 tablet 5   No current facility-administered medications for this visit.   Allergies:  Penicillins   ROS: No palpitations or syncope.  Physical Exam: VS:  BP 108/62   Pulse 68   Ht 5\' 5"  (1.651 m)   Wt 156 lb (70.8 kg)   SpO2 97%   BMI 25.96 kg/m , BMI Body mass index is 25.96 kg/m.  Wt Readings from Last 3 Encounters:  09/30/20 156 lb (70.8 kg)  08/31/20 151 lb (68.5 kg)  05/11/20 154 lb 9.6 oz (70.1 kg)    General: Elderly male, appears comfortable at rest. HEENT: Conjunctiva and lids normal, wearing a mask. Neck: Supple, no elevated JVP or carotid bruits, no thyromegaly. Lungs: Clear to auscultation, nonlabored breathing at rest. Cardiac: Regular rate and rhythm, no S3, 2/6 systolic murmur, no pericardial rub. Extremities: No pitting edema.  ECG:  An ECG dated 11/24/2019 was personally reviewed today and demonstrated:  Sinus rhythm with low voltage, possible old inferior infarct pattern, nonspecific T wave changes.  Recent Labwork: 11/24/2019: ALT 23; AST 55; B Natriuretic Peptide 211.0 12/15/2019: BUN 12; Creatinine, Ser 0.74; Potassium 4.0; Sodium 137; TSH 5.381 05/11/2020: Hemoglobin 12.4; Platelets 241.0   Other Studies Reviewed Today:  Exercise Myoview 09/13/2018:  Blood pressure demonstrated a normal response to exercise.  There was no ST segment deviation noted during stress.  Findings consistent with prior moderate  inferior myocardial infarction with mild peri-infarct ischemia.  The left ventricular ejection fraction is normal (55-65%).  This is a low risk study.  High resolution chest CT 09/23/2020: IMPRESSION: 1. No evidence of interstitial lung disease. 2. Mild scattered postinfectious pulmonary parenchymal scarring. 3. Aortic atherosclerosis (ICD10-I70.0). Coronary artery calcification.  Assessment and Plan:  1.  Ischemic heart disease based on Myoview from 2020 showing evidence of inferior infarct scar with mild peri-infarct ischemia.  He does not report any obvious progressive angina symptoms on medical therapy.  ECG reviewed.  Adding aspirin 81 mg daily, otherwise continue Norvasc, Diovan, and Crestor.  He has as needed nitroglycerin available.  Requesting interval lab work from PCP.  2.  Essential hypertension, blood pressure is well controlled today.  Medication Adjustments/Labs and Tests Ordered: Current medicines are reviewed at length with  the patient today.  Concerns regarding medicines are outlined above.   Tests Ordered: Orders Placed This Encounter  Procedures  . EKG 12-Lead    Medication Changes: No orders of the defined types were placed in this encounter.   Disposition:  Follow up 6 months in the [place] office.  Signed, Satira Sark, MD, [facility] 09/30/2020 9:42 AM    [facility] [place], [place], [place] [address] Phone: [phone]; Fax: [phone]

## 2020-09-30 ENCOUNTER — Encounter: Payer: Self-pay | Admitting: *Deleted

## 2020-09-30 ENCOUNTER — Encounter: Payer: Self-pay | Admitting: Cardiology

## 2020-09-30 ENCOUNTER — Ambulatory Visit (INDEPENDENT_AMBULATORY_CARE_PROVIDER_SITE_OTHER): Payer: Medicare Other | Admitting: Cardiology

## 2020-09-30 VITALS — BP 108/62 | HR 68 | Ht 65.0 in | Wt 156.0 lb

## 2020-09-30 DIAGNOSIS — I259 Chronic ischemic heart disease, unspecified: Secondary | ICD-10-CM | POA: Diagnosis not present

## 2020-09-30 DIAGNOSIS — I10 Essential (primary) hypertension: Secondary | ICD-10-CM | POA: Diagnosis not present

## 2020-09-30 MED ORDER — ASPIRIN EC 81 MG PO TBEC
81.0000 mg | DELAYED_RELEASE_TABLET | Freq: Every day | ORAL | 3 refills | Status: DC
Start: 2020-09-30 — End: 2023-01-15

## 2020-09-30 NOTE — Patient Instructions (Addendum)
Medication Instructions:   Your physician has recommended you make the following change in your medication:   Start aspirin 81 mg by mouth daily  Continue other medications the same  Labwork:  none  Testing/Procedures:  none  Follow-Up:  Your physician recommends that you schedule a follow-up appointment in: 6 months.  Any Other Special Instructions Will Be Listed Below (If Applicable).  If you need a refill on your cardiac medications before your next appointment, please call your pharmacy.

## 2020-10-01 ENCOUNTER — Telehealth: Payer: Self-pay | Admitting: Pulmonary Disease

## 2020-10-01 NOTE — Telephone Encounter (Signed)
Attempted to call pt's sister Lelan Pons but unable to reach. Left her a message letting her know that we would call them back once Dr. Vaughan Browner has reviewed pt's CT.  Dr. Vaughan Browner, please advise on results of pt's CT which was performed 3/17.

## 2020-10-04 NOTE — Telephone Encounter (Signed)
CT looks okay with no significant lung issues.  There is mild plaque buildup in the blood vessels of the heart.  He can follow-up with primary care regarding this

## 2020-10-04 NOTE — Telephone Encounter (Signed)
Called and spoke with Travis [place]. She verbalized understanding of the CT results. She confirmed his PCP is Dr. Manuella Ghazi with [facility]. I have faxed a copy of the CT scan results to his office.   Nothing further needed.

## 2020-10-07 ENCOUNTER — Ambulatory Visit (INDEPENDENT_AMBULATORY_CARE_PROVIDER_SITE_OTHER): Payer: Medicare Other | Admitting: Gastroenterology

## 2020-10-07 ENCOUNTER — Other Ambulatory Visit: Payer: Self-pay

## 2020-10-07 VITALS — BP 128/84 | HR 78 | Temp 98.4°F | Ht 65.0 in | Wt 154.0 lb

## 2020-10-07 DIAGNOSIS — R1319 Other dysphagia: Secondary | ICD-10-CM

## 2020-10-07 DIAGNOSIS — K2100 Gastro-esophageal reflux disease with esophagitis, without bleeding: Secondary | ICD-10-CM | POA: Diagnosis not present

## 2020-10-07 NOTE — Patient Instructions (Addendum)
Schedule EGD with ED and colonoscopy Explained presumed etiology of reflux symptoms. Instruction provided in the use of antireflux medication - patient should take medication in the morning 30-45 minutes before eating breakfast. Discussed avoidance of eating within 2 hours of lying down to sleep and benefit of blocks to elevate head of bed. Can take Pepcid as needed for breakthrough episodes of reflux Continue pantoprazole 40 mg every day

## 2020-10-07 NOTE — Progress Notes (Signed)
Maylon Peppers, M.D. Gastroenterology & Hepatology [facility] [address] [place],  [address]  Primary Care Physician: Monico Blitz, [place] [address]  I will communicate my assessment and recommendations to the referring MD via EMR.  Problems: 1. GERD 2. History of Schatzki's ring and erosive esophagitis  History of Present Illness: Travis Beck is a 75 y.o. male with past medical history of ischemic cardiomyopathy, hypertension, diabetes, emphysema, asthma and arthritis, who presents for follow up after presenting recurrent heartburn.  The patient was last seen on 05/06/2019. At that time, the patient was recommended to take the pantoprazole 40 mg twice a day and he was scheduled for an EGD with findings described below.  The patient reports that after undergoing his most recent EGD he was feeling fine until 07/08/2020 when he presented sudden onset of burning sensation in his throiat and heartburn in his chest.  Due to this, he decided to go to [facility] on same day.  He was givena  GI coctail at that time and his symptoms resolved.  He had cardiac testing which EKG which was within normal limits.  Neck x-ray and chest x-ray were completed normal.  The patient also underwent testing with a BMP and CMP which were within normal limits.  He reports that he was taking pantoprazole 40 mg every day prior to his presentation, which he continued upon discharge.  Patient reports that since his last event he has not presented any more episodes of heartburn or chest pain.  Denies having any complaints at the moment and states taking the pantoprazole compliantly every day. Usually takes his medication 5 minutes before having breakfast with the rest of his medicines.  Reports that sometimes solid food like bread can get stuck in his throat. Only a couple of times he has had to vomit the food. No odynophagia. The  patient denies having any nausea, vomiting, fever, chills, hematochezia, melena, hematemesis, abdominal distention, abdominal pain, diarrhea, jaundice, pruritus or weight loss.  Last EGD: 05/21/2019 -there was presence of and a stenosis at 34 cm from the incisors which was dilated with [facility] dilator up to 82 [place] with presence of mucosal disruption.  There was presence of a nonobstructive Schatzki's ring at the GE junction and a 3 cm hiatal hernia.  Previous ulcer in the distal esophagus had already healed. Last Colonoscopy: At least 10 years ago, normal per the patient but no reports are available.  Past Medical History: Past Medical History:  Diagnosis Date  . Arthritis   . Asthma    Childhood  . Emphysema lung ([place])   . Essential hypertension   . Ischemic heart disease    Myoview 2020 indicating inferior infarct scar with mild peri-infarct ischemia - managed medically  . Type 2 diabetes mellitus ([place])     Past Surgical History: Past Surgical History:  Procedure Laterality Date  . BIOPSY  08/30/2018   Procedure: BIOPSY;  Surgeon: Rogene Houston, MD;  [facility];  Service: Endoscopy;;  gastric   . COLONOSCOPY    . ESOPHAGEAL DILATION N/A 05/21/2019   Procedure: ESOPHAGEAL DILATION;  Surgeon: Rogene Houston, MD;  [facility];  Service: Endoscopy;  Laterality: N/A;  . ESOPHAGOGASTRODUODENOSCOPY N/A 05/21/2019   Procedure: ESOPHAGOGASTRODUODENOSCOPY (EGD);  Surgeon: Rogene Houston, MD;  [facility];  Service: Endoscopy;  Laterality: N/A;  730  . ESOPHAGOGASTRODUODENOSCOPY (EGD) WITH PROPOFOL N/A 08/30/2018   Procedure:  ESOPHAGOGASTRODUODENOSCOPY (EGD) WITH PROPOFOL;  Surgeon: Rogene Houston, MD;  [facility];  Service: Endoscopy;  Laterality: N/A;  1:30  . HERNIA REPAIR     bilateral lower abdomen    Family History:No family history on file.  Social History: Social History   Tobacco Use  Smoking Status Former Smoker  .  Packs/day: 2.00  . Years: 33.00  . Pack years: 66.00  . Types: Cigarettes  . Quit date: 01/22/1993  . Years since quitting: 27.7  Smokeless Tobacco Never Used   Social History   Substance and Sexual Activity  Alcohol Use Not Currently   Social History   Substance and Sexual Activity  Drug Use Never    Allergies: Allergies  Allergen Reactions  . Penicillins Rash    No problems with ampicillin during hospitalization    Medications: Current Outpatient Medications  Medication Sig Dispense Refill  . acetaminophen (TYLENOL) 325 MG tablet Take 2 tablets (650 mg total) by mouth every 6 (six) hours as needed for mild pain (or Fever >/= 101). 12 tablet 0  . albuterol (VENTOLIN HFA) 108 (90 Base) MCG/ACT inhaler Inhale 2 puffs into the lungs every 4 (four) hours as needed for wheezing or shortness of breath. 18 g 0  . ALPRAZolam (XANAX) 1 MG tablet Take 1 tablet (1 mg total) by mouth 3 (three) times daily as needed for anxiety. 30 tablet 0  . amLODipine (NORVASC) 2.5 MG tablet Take 1 tablet (2.5 mg total) by mouth daily. For BP 30 tablet 5  . aspirin EC 81 MG tablet Take 1 tablet (81 mg total) by mouth daily. Swallow whole. 90 tablet 3  . buprenorphine (SUBUTEX) 8 MG SUBL SL tablet Place 8 mg under the tongue daily.    . cetirizine (ZYRTEC) 10 MG tablet Take 10 mg by mouth as needed for allergies (itching).    . fluticasone (FLONASE) 50 MCG/ACT nasal spray Place 2 sprays into both nostrils daily.     . Fluticasone-Umeclidin-Vilant (TRELEGY ELLIPTA) 100-62.5-25 MCG/INH AEPB Inhale 1 puff into the lungs daily. 14 each 0  . gabapentin (NEURONTIN) 400 MG capsule Take 1 capsule (400 mg total) by mouth every 8 (eight) hours as needed.  1  . glyBURIDE (DIABETA) 5 MG tablet Take 10 mg by mouth 2 (two) times daily with a meal.     . guaiFENesin (MUCINEX) 600 MG 12 hr tablet Take 1 tablet (600 mg total) by mouth 2 (two) times daily. 20 tablet 2  . ipratropium-albuterol (DUONEB) 0.5-2.5 (3) MG/3ML  SOLN Inhale 3 mLs into the lungs every 6 (six) hours as needed.    Marland Kitchen levocetirizine (XYZAL) 5 MG tablet Take 1 tablet by mouth. occasionally    . metFORMIN (GLUCOPHAGE) 1000 MG tablet Take 1,000 mg by mouth 2 (two) times daily with a meal.     . pantoprazole (PROTONIX) 40 MG tablet TAKE 1 TABLET BY MOUTH TWICE DAILY BEFORE A MEAL 60 tablet 1  . polyethylene glycol (MIRALAX / GLYCOLAX) 17 g packet Take 17 g by mouth 2 (two) times daily. 60 each 1  . rosuvastatin (CRESTOR) 20 MG tablet Take 1 tablet (20 mg total) by mouth daily. 90 tablet 1  . TRELEGY ELLIPTA 100-62.5-25 MCG/INH AEPB inhale ONE PUFF into THE lungs DAILY 60 each 3  . triamcinolone cream (KENALOG) 0.1 % Apply 1 application topically daily as needed.    . valsartan (DIOVAN) 40 MG tablet Take 1 tablet (40 mg total) by mouth daily. For Heart and blood pressure  30 tablet 5   No current facility-administered medications for this visit.    Review of Systems: GENERAL: negative for malaise, night sweats HEENT: No changes in hearing or vision, no nose bleeds or other nasal problems. NECK: Negative for lumps, goiter, pain and significant neck swelling RESPIRATORY: Negative for cough, wheezing CARDIOVASCULAR: Negative for chest pain, leg swelling, palpitations, orthopnea GI: SEE HPI MUSCULOSKELETAL: Negative for joint pain or swelling, back pain, and muscle pain. SKIN: Negative for lesions, rash PSYCH: Negative for sleep disturbance, mood disorder and recent psychosocial stressors. HEMATOLOGY Negative for prolonged bleeding, bruising easily, and swollen nodes. ENDOCRINE: Negative for cold or heat intolerance, polyuria, polydipsia and goiter. NEURO: negative for tremor, gait imbalance, syncope and seizures. The remainder of the review of systems is noncontributory.   Physical Exam: BP 128/84 (BP Location: Left Arm, Patient Position: Sitting, Cuff Size: Small)   Pulse 78   Temp 98.4 F (36.9 C) (Oral)   Ht 5\' 5"  (1.651 m)   Wt 154  lb (69.9 kg)   BMI 25.63 kg/m  GENERAL: The patient is AO x3, in no acute distress. HEENT: Head is normocephalic and atraumatic. EOMI are intact. Mouth is well hydrated and without lesions. NECK: Supple. No masses LUNGS: Clear to auscultation. No presence of rhonchi/wheezing/rales. Adequate chest expansion HEART: RRR, normal s1 and s2. ABDOMEN: Soft, nontender, no guarding, no peritoneal signs, and nondistended. BS +. No masses. EXTREMITIES: Without any cyanosis, clubbing, rash, lesions or edema. NEUROLOGIC: AOx3, no focal motor deficit. SKIN: no jaundice, no rashes  Imaging/Labs: as above  I personally reviewed and interpreted the available labs, imaging and endoscopic files.  Impression and Plan: Travis Beck is a 75 y.o. male with past medical history of ischemic cardiomyopathy, hypertension, diabetes, emphysema, asthma and arthritis, who presents for follow up after presenting recurrent heartburn.  The patient has presented recurrent symptoms of GERD that resolved with GI cocktail recently.  I emphasized to the patient the importance of taking the medication adequately and I instructed him to take it at least 30 minutes prior to his breakfast.  He understood and agreed.  We will continue with the same dosage of pantoprazole 40 mg every day.  If he has any breakthrough symptoms the patient was counseled to take Pepcid as needed.  Given the presence of recurrent episodes of dysphagia, we will schedule an EGD with possible esophageal dilation.  We will also proceed with a colonoscopy on the same day for colorectal cancer screening.  Patient understood and agreed.  - Schedule EGD with ED and colonoscopy -Explained presumed etiology of reflux symptoms. Instruction provided in the use of antireflux medication - patient should take medication in the morning 30-45 minutes before eating breakfast. Discussed avoidance of eating within 2 hours of lying down to sleep and benefit of blocks to  elevate head of bed. -Can take Pepcid as needed for breakthrough episodes of reflux -Continue pantoprazole 40 mg every day  All questions were answered.      Harvel Quale, MD Gastroenterology and Hepatology [facility]

## 2020-10-07 NOTE — H&P (View-Only) (Signed)
Travis Beck, M.D. Gastroenterology & Hepatology [facility] [address] [place], [place] [address]  Primary Care Physician: Monico Blitz, [place] [address]  I will communicate my assessment and recommendations to the referring MD via EMR.  Problems: 1. GERD 2. History of Schatzki's ring and erosive esophagitis  History of Present Illness: Travis Beck is a 75 y.o. male with past medical history of ischemic cardiomyopathy, hypertension, diabetes, emphysema, asthma and arthritis, who presents for follow up after presenting recurrent heartburn.  The patient was last seen on 05/06/2019. At that time, the patient was recommended to take the pantoprazole 40 mg twice a day and he was scheduled for an EGD with findings described below.  The patient reports that after undergoing his most recent EGD he was feeling fine until 07/08/2020 when he presented sudden onset of burning sensation in his throiat and heartburn in his chest.  Due to this, he decided to go to [facility] on same day.  He was givena  GI coctail at that time and his symptoms resolved.  He had cardiac testing which EKG which was within normal limits.  Neck x-ray and chest x-ray were completed normal.  The patient also underwent testing with a BMP and CMP which were within normal limits.  He reports that he was taking pantoprazole 40 mg every day prior to his presentation, which he continued upon discharge.  Patient reports that since his last event he has not presented any more episodes of heartburn or chest pain.  Denies having any complaints at the moment and states taking the pantoprazole compliantly every day. Usually takes his medication 5 minutes before having breakfast with the rest of his medicines.  Reports that sometimes solid food like bread can get stuck in his throat. Only a couple of times he has had to vomit the food. No odynophagia. The  patient denies having any nausea, vomiting, fever, chills, hematochezia, melena, hematemesis, abdominal distention, abdominal pain, diarrhea, jaundice, pruritus or weight loss.  Last EGD: 05/21/2019 -there was presence of and a stenosis at 34 cm from the incisors which was dilated with [facility] dilator up to 67 [place] with presence of mucosal disruption.  There was presence of a nonobstructive Schatzki's ring at the GE junction and a 3 cm hiatal hernia.  Previous ulcer in the distal esophagus had already healed. Last Colonoscopy: At least 10 years ago, normal per the patient but no reports are available.  Past Medical History: Past Medical History:  Diagnosis Date  . Arthritis   . Asthma    Childhood  . Emphysema lung ([place])   . Essential hypertension   . Ischemic heart disease    Myoview 2020 indicating inferior infarct scar with mild peri-infarct ischemia - managed medically  . Type 2 diabetes mellitus ([place])     Past Surgical History: Past Surgical History:  Procedure Laterality Date  . BIOPSY  08/30/2018   Procedure: BIOPSY;  Surgeon: Rogene Houston, MD;  [facility];  Service: Endoscopy;;  gastric   . COLONOSCOPY    . ESOPHAGEAL DILATION N/A 05/21/2019   Procedure: ESOPHAGEAL DILATION;  Surgeon: Rogene Houston, MD;  [facility];  Service: Endoscopy;  Laterality: N/A;  . ESOPHAGOGASTRODUODENOSCOPY N/A 05/21/2019   Procedure: ESOPHAGOGASTRODUODENOSCOPY (EGD);  Surgeon: Rogene Houston, MD;  [facility];  Service: Endoscopy;  Laterality: N/A;  730  . ESOPHAGOGASTRODUODENOSCOPY (EGD) WITH PROPOFOL N/A 08/30/2018   Procedure:  ESOPHAGOGASTRODUODENOSCOPY (EGD) WITH PROPOFOL;  Surgeon: Rogene Houston, MD;  [facility];  Service: Endoscopy;  Laterality: N/A;  1:30  . HERNIA REPAIR     bilateral lower abdomen    Family History:No family history on file.  Social History: Social History   Tobacco Use  Smoking Status Former Smoker  .  Packs/day: 2.00  . Years: 33.00  . Pack years: 66.00  . Types: Cigarettes  . Quit date: 01/22/1993  . Years since quitting: 27.7  Smokeless Tobacco Never Used   Social History   Substance and Sexual Activity  Alcohol Use Not Currently   Social History   Substance and Sexual Activity  Drug Use Never    Allergies: Allergies  Allergen Reactions  . Penicillins Rash    No problems with ampicillin during hospitalization    Medications: Current Outpatient Medications  Medication Sig Dispense Refill  . acetaminophen (TYLENOL) 325 MG tablet Take 2 tablets (650 mg total) by mouth every 6 (six) hours as needed for mild pain (or Fever >/= 101). 12 tablet 0  . albuterol (VENTOLIN HFA) 108 (90 Base) MCG/ACT inhaler Inhale 2 puffs into the lungs every 4 (four) hours as needed for wheezing or shortness of breath. 18 g 0  . ALPRAZolam (XANAX) 1 MG tablet Take 1 tablet (1 mg total) by mouth 3 (three) times daily as needed for anxiety. 30 tablet 0  . amLODipine (NORVASC) 2.5 MG tablet Take 1 tablet (2.5 mg total) by mouth daily. For BP 30 tablet 5  . aspirin EC 81 MG tablet Take 1 tablet (81 mg total) by mouth daily. Swallow whole. 90 tablet 3  . buprenorphine (SUBUTEX) 8 MG SUBL SL tablet Place 8 mg under the tongue daily.    . cetirizine (ZYRTEC) 10 MG tablet Take 10 mg by mouth as needed for allergies (itching).    . fluticasone (FLONASE) 50 MCG/ACT nasal spray Place 2 sprays into both nostrils daily.     . Fluticasone-Umeclidin-Vilant (TRELEGY ELLIPTA) 100-62.5-25 MCG/INH AEPB Inhale 1 puff into the lungs daily. 14 each 0  . gabapentin (NEURONTIN) 400 MG capsule Take 1 capsule (400 mg total) by mouth every 8 (eight) hours as needed.  1  . glyBURIDE (DIABETA) 5 MG tablet Take 10 mg by mouth 2 (two) times daily with a meal.     . guaiFENesin (MUCINEX) 600 MG 12 hr tablet Take 1 tablet (600 mg total) by mouth 2 (two) times daily. 20 tablet 2  . ipratropium-albuterol (DUONEB) 0.5-2.5 (3) MG/3ML  SOLN Inhale 3 mLs into the lungs every 6 (six) hours as needed.    Marland Kitchen levocetirizine (XYZAL) 5 MG tablet Take 1 tablet by mouth. occasionally    . metFORMIN (GLUCOPHAGE) 1000 MG tablet Take 1,000 mg by mouth 2 (two) times daily with a meal.     . pantoprazole (PROTONIX) 40 MG tablet TAKE 1 TABLET BY MOUTH TWICE DAILY BEFORE A MEAL 60 tablet 1  . polyethylene glycol (MIRALAX / GLYCOLAX) 17 g packet Take 17 g by mouth 2 (two) times daily. 60 each 1  . rosuvastatin (CRESTOR) 20 MG tablet Take 1 tablet (20 mg total) by mouth daily. 90 tablet 1  . TRELEGY ELLIPTA 100-62.5-25 MCG/INH AEPB inhale ONE PUFF into THE lungs DAILY 60 each 3  . triamcinolone cream (KENALOG) 0.1 % Apply 1 application topically daily as needed.    . valsartan (DIOVAN) 40 MG tablet Take 1 tablet (40 mg total) by mouth daily. For Heart and blood pressure  30 tablet 5   No current facility-administered medications for this visit.    Review of Systems: GENERAL: negative for malaise, night sweats HEENT: No changes in hearing or vision, no nose bleeds or other nasal problems. NECK: Negative for lumps, goiter, pain and significant neck swelling RESPIRATORY: Negative for cough, wheezing CARDIOVASCULAR: Negative for chest pain, leg swelling, palpitations, orthopnea GI: SEE HPI MUSCULOSKELETAL: Negative for joint pain or swelling, back pain, and muscle pain. SKIN: Negative for lesions, rash PSYCH: Negative for sleep disturbance, mood disorder and recent psychosocial stressors. HEMATOLOGY Negative for prolonged bleeding, bruising easily, and swollen nodes. ENDOCRINE: Negative for cold or heat intolerance, polyuria, polydipsia and goiter. NEURO: negative for tremor, gait imbalance, syncope and seizures. The remainder of the review of systems is noncontributory.   Physical Exam: BP 128/84 (BP Location: Left Arm, Patient Position: Sitting, Cuff Size: Small)   Pulse 78   Temp 98.4 F (36.9 C) (Oral)   Ht 5\' 5"  (1.651 m)   Wt 154  lb (69.9 kg)   BMI 25.63 kg/m  GENERAL: The patient is AO x3, in no acute distress. HEENT: Head is normocephalic and atraumatic. EOMI are intact. Mouth is well hydrated and without lesions. NECK: Supple. No masses LUNGS: Clear to auscultation. No presence of rhonchi/wheezing/rales. Adequate chest expansion HEART: RRR, normal s1 and s2. ABDOMEN: Soft, nontender, no guarding, no peritoneal signs, and nondistended. BS +. No masses. EXTREMITIES: Without any cyanosis, clubbing, rash, lesions or edema. NEUROLOGIC: AOx3, no focal motor deficit. SKIN: no jaundice, no rashes  Imaging/Labs: as above  I personally reviewed and interpreted the available labs, imaging and endoscopic files.  Impression and Plan: Travis Beck is a 75 y.o. male with past medical history of ischemic cardiomyopathy, hypertension, diabetes, emphysema, asthma and arthritis, who presents for follow up after presenting recurrent heartburn.  The patient has presented recurrent symptoms of GERD that resolved with GI cocktail recently.  I emphasized to the patient the importance of taking the medication adequately and I instructed him to take it at least 30 minutes prior to his breakfast.  He understood and agreed.  We will continue with the same dosage of pantoprazole 40 mg every day.  If he has any breakthrough symptoms the patient was counseled to take Pepcid as needed.  Given the presence of recurrent episodes of dysphagia, we will schedule an EGD with possible esophageal dilation.  We will also proceed with a colonoscopy on the same day for colorectal cancer screening.  Patient understood and agreed.  - Schedule EGD with ED and colonoscopy -Explained presumed etiology of reflux symptoms. Instruction provided in the use of antireflux medication - patient should take medication in the morning 30-45 minutes before eating breakfast. Discussed avoidance of eating within 2 hours of lying down to sleep and benefit of blocks to  elevate head of bed. -Can take Pepcid as needed for breakthrough episodes of reflux -Continue pantoprazole 40 mg every day  All questions were answered.      Harvel Quale, MD Gastroenterology and Hepatology [facility]

## 2020-10-08 ENCOUNTER — Telehealth (INDEPENDENT_AMBULATORY_CARE_PROVIDER_SITE_OTHER): Payer: Self-pay

## 2020-10-08 ENCOUNTER — Other Ambulatory Visit (INDEPENDENT_AMBULATORY_CARE_PROVIDER_SITE_OTHER): Payer: Self-pay

## 2020-10-08 ENCOUNTER — Encounter (INDEPENDENT_AMBULATORY_CARE_PROVIDER_SITE_OTHER): Payer: Self-pay

## 2020-10-08 MED ORDER — NA SULFATE-K SULFATE-MG SULF 17.5-3.13-1.6 GM/177ML PO SOLN: 354.0000 mL | Freq: Once | ORAL | 0 refills | Status: AC

## 2020-10-08 NOTE — Telephone Encounter (Signed)
Travis Beck, CMA  

## 2020-10-11 DIAGNOSIS — L570 Actinic keratosis: Secondary | ICD-10-CM | POA: Diagnosis not present

## 2020-10-11 DIAGNOSIS — L280 Lichen simplex chronicus: Secondary | ICD-10-CM | POA: Diagnosis not present

## 2020-10-11 DIAGNOSIS — L821 Other seborrheic keratosis: Secondary | ICD-10-CM | POA: Diagnosis not present

## 2020-10-12 ENCOUNTER — Other Ambulatory Visit: Payer: Self-pay | Admitting: Cardiology

## 2020-10-12 ENCOUNTER — Other Ambulatory Visit (INDEPENDENT_AMBULATORY_CARE_PROVIDER_SITE_OTHER): Payer: Self-pay | Admitting: Internal Medicine

## 2020-10-12 ENCOUNTER — Telehealth (INDEPENDENT_AMBULATORY_CARE_PROVIDER_SITE_OTHER): Payer: Self-pay

## 2020-10-12 ENCOUNTER — Encounter (INDEPENDENT_AMBULATORY_CARE_PROVIDER_SITE_OTHER): Payer: Self-pay

## 2020-10-12 MED ORDER — PEG 3350-KCL-NA BICARB-NACL 420 G PO SOLR
4000.0000 mL | ORAL | 0 refills | Status: DC
Start: 2020-10-12 — End: 2020-11-05

## 2020-10-12 NOTE — Telephone Encounter (Signed)
Travis Beck, CMA  

## 2020-10-25 DIAGNOSIS — J449 Chronic obstructive pulmonary disease, unspecified: Secondary | ICD-10-CM | POA: Diagnosis not present

## 2020-10-25 DIAGNOSIS — I10 Essential (primary) hypertension: Secondary | ICD-10-CM | POA: Diagnosis not present

## 2020-10-25 DIAGNOSIS — Z299 Encounter for prophylactic measures, unspecified: Secondary | ICD-10-CM | POA: Diagnosis not present

## 2020-10-25 DIAGNOSIS — E1165 Type 2 diabetes mellitus with hyperglycemia: Secondary | ICD-10-CM | POA: Diagnosis not present

## 2020-10-25 DIAGNOSIS — R21 Rash and other nonspecific skin eruption: Secondary | ICD-10-CM | POA: Diagnosis not present

## 2020-10-25 DIAGNOSIS — J309 Allergic rhinitis, unspecified: Secondary | ICD-10-CM | POA: Diagnosis not present

## 2020-11-02 NOTE — Patient Instructions (Signed)
Travis Beck  11/02/2020     @PREFPERIOPPHARMACY @   Your procedure is scheduled on  11/05/2020   Report to [name] at  [phone]  A.M.   Call this number if you have problems the morning of surgery:  [phone]   Remember:  Follow the diet and prep instructions given to you by the office.                       Take these medicines the morning of surgery with A SIP OF WATER  Xanax (if needed), amlodipine, subutex, zyrtec, gabapentin, protonix.  Use your nebulizer and your inhalers before you come and bring your rescue inhaler with you.  DO NOT take any medications for diabetes the morning of your procedure.  If your glucose is 70 or below the morning of your procedure, drink 1/2 cup of clear liquid that contains sugar and recheck your glucose in 15 minutes. If your glucose is still 70 or below, call [phone] for instructions.  If your glucose is 300 or above the morning of your procedure, call [phone] for instructions.      Please brush your teeth.  Do not wear jewelry, make-up or nail polish.  Do not wear lotions, powders, or perfumes, or deodorant.  Do not shave 48 hours prior to surgery.  Men may shave face and neck.  Do not bring valuables to the hospital.  [facility] is not responsible for any belongings or valuables.  Contacts, dentures or bridgework may not be worn into surgery.  Leave your suitcase in the car.  After surgery it may be brought to your room.  For patients admitted to the hospital, discharge time will be determined by your treatment team.  Patients discharged the day of surgery will not be allowed to drive home and must have someone with them for 24 hours.    Special instructions:  DO NOT smoke tobacco or vape for 24 hours before your procedure.   Please read over the following fact sheets that you were given. Anesthesia Post-op Instructions and Care and Recovery After Surgery       Upper Endoscopy, Adult,  Care After This sheet gives you information about how to care for yourself after your procedure. Your health care provider may also give you more specific instructions. If you have problems or questions, contact your health care provider. What can I expect after the procedure? After the procedure, it is common to have:  A sore throat.  Mild stomach pain or discomfort.  Bloating.  Nausea. Follow these instructions at home:  Follow instructions from your health care provider about what to eat or drink after your procedure.  Return to your normal activities as told by your health care provider. Ask your health care provider what activities are safe for you.  Take over-the-counter and prescription medicines only as told by your health care provider.  If you were given a sedative during the procedure, it can affect you for several hours. Do not drive or operate machinery until your health care provider says that it is safe.  Keep all follow-up visits as told by your health care provider. This is important.   Contact a health care provider if you have:  A sore throat that lasts longer than one day.  Trouble swallowing. Get help right away if:  You vomit blood or your vomit looks like coffee grounds.  You have: ?  A fever. ? Bloody, black, or tarry stools. ? A severe sore throat or you cannot swallow. ? Difficulty breathing. ? Severe pain in your chest or abdomen. Summary  After the procedure, it is common to have a sore throat, mild stomach discomfort, bloating, and nausea.  If you were given a sedative during the procedure, it can affect you for several hours. Do not drive or operate machinery until your health care provider says that it is safe.  Follow instructions from your health care provider about what to eat or drink after your procedure.  Return to your normal activities as told by your health care provider. This information is not intended to replace advice given to  you by your health care provider. Make sure you discuss any questions you have with your health care provider. Document Revised: 06/24/2019 Document Reviewed: 11/26/2017 Elsevier Patient Education  2021 Elsevier Inc.  https://www.asge.org/home/for-patients/patient-information/understanding-eso-dilation-updated">  Esophageal Dilatation Esophageal dilatation, also called esophageal dilation, is a procedure to widen or open a blocked or narrowed part of the esophagus. The esophagus is the part of the body that moves food and liquid from the mouth to the stomach. You may need this procedure if:  You have a buildup of scar tissue in your esophagus that makes it difficult, painful, or impossible to swallow. This can be caused by gastroesophageal reflux disease (GERD).  You have cancer of the esophagus.  There is a problem with how food moves through your esophagus. In some cases, you may need this procedure repeated at a later time to dilate the esophagus gradually. Tell a health care provider about:  Any allergies you have.  All medicines you are taking, including vitamins, herbs, eye drops, creams, and over-the-counter medicines.  Any problems you or family members have had with anesthetic medicines.  Any blood disorders you have.  Any surgeries you have had.  Any medical conditions you have.  Any antibiotic medicines you are required to take before dental procedures.  Whether you are pregnant or may be pregnant. What are the risks? Generally, this is a safe procedure. However, problems may occur, including:  Bleeding due to a tear in the lining of the esophagus.  A hole, or perforation, in the esophagus. What happens before the procedure?  Ask your health care provider about: ? Changing or stopping your regular medicines. This is especially important if you are taking diabetes medicines or blood thinners. ? Taking medicines such as aspirin and ibuprofen. These medicines can  thin your blood. Do not take these medicines unless your health care provider tells you to take them. ? Taking over-the-counter medicines, vitamins, herbs, and supplements.  Follow instructions from your health care provider about eating or drinking restrictions.  Plan to have a responsible adult take you home from the hospital or clinic.  Plan to have a responsible adult care for you for the time you are told after you leave the hospital or clinic. This is important. What happens during the procedure?  You may be given a medicine to help you relax (sedative).  A numbing medicine may be sprayed into the back of your throat, or you may gargle the medicine.  Your health care provider may perform the dilatation using various surgical instruments, such as: ? Simple dilators. This instrument is carefully placed in the esophagus to stretch it. ? Guided wire bougies. This involves using an endoscope to insert a wire into the esophagus. A dilator is passed over this wire to enlarge the esophagus. Then the  wire is removed. ? Balloon dilators. An endoscope with a small balloon is inserted into the esophagus. The balloon is inflated to stretch the esophagus and open it up. The procedure may vary among health care providers and hospitals. What can I expect after the procedure?  Your blood pressure, heart rate, breathing rate, and blood oxygen level will be monitored until you leave the hospital or clinic.  Your throat may feel slightly sore and numb. This will get better over time.  You will not be allowed to eat or drink until your throat is no longer numb.  When you are able to drink, urinate, and sit on the edge of the bed without nausea or dizziness, you may be able to return home. Follow these instructions at home:  Take over-the-counter and prescription medicines only as told by your health care provider.  If you were given a sedative during the procedure, it can affect you for several  hours. Do not drive or operate machinery until your health care provider says that it is safe.  Plan to have a responsible adult care for you for the time you are told. This is important.  Follow instructions from your health care provider about any eating or drinking restrictions.  Do not use any products that contain nicotine or tobacco, such as cigarettes, e-cigarettes, and chewing tobacco. If you need help quitting, ask your health care provider.  Keep all follow-up visits. This is important. Contact a health care provider if:  You have a fever.  You have pain that is not relieved by medicine. Get help right away if:  You have chest pain.  You have trouble breathing.  You have trouble swallowing.  You vomit blood.  You have black, tarry, or bloody stools. These symptoms may represent a serious problem that is an emergency. Do not wait to see if the symptoms will go away. Get medical help right away. Call your local emergency services (911 in the [place] Do not drive yourself to the hospital. Summary  Esophageal dilatation, also called esophageal dilation, is a procedure to widen or open a blocked or narrowed part of the esophagus.  Plan to have a responsible adult take you home from the hospital or clinic.  For this procedure, a numbing medicine may be sprayed into the back of your throat, or you may gargle the medicine.  Do not drive or operate machinery until your health care provider says that it is safe. This information is not intended to replace advice given to you by your health care provider. Make sure you discuss any questions you have with your health care provider. Document Revised: 11/12/2019 Document Reviewed: 11/12/2019 Elsevier Patient Education  2021 [employer].  Colonoscopy, Adult, Care After This sheet gives you information about how to care for yourself after your procedure. Your health care provider may also give you more specific instructions. If you  have problems or questions, contact your health care provider. What can I expect after the procedure? After the procedure, it is common to have:  A small amount of blood in your stool for 24 hours after the procedure.  Some gas.  Mild cramping or bloating of your abdomen. Follow these instructions at home: Eating and drinking  Drink enough fluid to keep your urine pale yellow.  Follow instructions from your health care provider about eating or drinking restrictions.  Resume your normal diet as instructed by your health care provider. Avoid heavy or fried foods that are hard to digest.  Activity  Rest as told by your health care provider.  Avoid sitting for a long time without moving. Get up to take short walks every 1-2 hours. This is important to improve blood flow and breathing. Ask for help if you feel weak or unsteady.  Return to your normal activities as told by your health care provider. Ask your health care provider what activities are safe for you. Managing cramping and bloating  Try walking around when you have cramps or feel bloated.  Apply heat to your abdomen as told by your health care provider. Use the heat source that your health care provider recommends, such as a moist heat pack or a heating pad. ? Place a towel between your skin and the heat source. ? Leave the heat on for 20-30 minutes. ? Remove the heat if your skin turns bright red. This is especially important if you are unable to feel pain, heat, or cold. You may have a greater risk of getting burned.   General instructions  If you were given a sedative during the procedure, it can affect you for several hours. Do not drive or operate machinery until your health care provider says that it is safe.  For the first 24 hours after the procedure: ? Do not sign important documents. ? Do not drink alcohol. ? Do your regular daily activities at a slower pace than normal. ? Eat soft foods that are easy to  digest.  Take over-the-counter and prescription medicines only as told by your health care provider.  Keep all follow-up visits as told by your health care provider. This is important. Contact a health care provider if:  You have blood in your stool 2-3 days after the procedure. Get help right away if you have:  More than a small spotting of blood in your stool.  Large blood clots in your stool.  Swelling of your abdomen.  Nausea or vomiting.  A fever.  Increasing pain in your abdomen that is not relieved with medicine. Summary  After the procedure, it is common to have a small amount of blood in your stool. You may also have mild cramping and bloating of your abdomen.  If you were given a sedative during the procedure, it can affect you for several hours. Do not drive or operate machinery until your health care provider says that it is safe.  Get help right away if you have a lot of blood in your stool, nausea or vomiting, a fever, or increased pain in your abdomen. This information is not intended to replace advice given to you by your health care provider. Make sure you discuss any questions you have with your health care provider. Document Revised: 06/20/2019 Document Reviewed: 01/20/2019 Elsevier Patient Education  2021 [place] After This sheet gives you information about how to care for yourself after your procedure. Your health care provider may also give you more specific instructions. If you have problems or questions, contact your health care provider. What can I expect after the procedure? After the procedure, it is common to have:  Tiredness.  Forgetfulness about what happened after the procedure.  Impaired judgment for important decisions.  Nausea or vomiting.  Some difficulty with balance. Follow these instructions at home: For the time period you were told by your health care provider:  Rest as needed.  Do not  participate in activities where you could fall or become injured.  Do not drive or use machinery.  Do  not drink alcohol.  Do not take sleeping pills or medicines that cause drowsiness.  Do not make important decisions or sign legal documents.  Do not take care of children on your own.      Eating and drinking  Follow the diet that is recommended by your health care provider.  Drink enough fluid to keep your urine pale yellow.  If you vomit: ? Drink water, juice, or soup when you can drink without vomiting. ? Make sure you have little or no nausea before eating solid foods. General instructions  Have a responsible adult stay with you for the time you are told. It is important to have someone help care for you until you are awake and alert.  Take over-the-counter and prescription medicines only as told by your health care provider.  If you have sleep apnea, surgery and certain medicines can increase your risk for breathing problems. Follow instructions from your health care provider about wearing your sleep device: ? Anytime you are sleeping, including during daytime naps. ? While taking prescription pain medicines, sleeping medicines, or medicines that make you drowsy.  Avoid smoking.  Keep all follow-up visits as told by your health care provider. This is important. Contact a health care provider if:  You keep feeling nauseous or you keep vomiting.  You feel light-headed.  You are still sleepy or having trouble with balance after 24 hours.  You develop a rash.  You have a fever.  You have redness or swelling around the IV site. Get help right away if:  You have trouble breathing.  You have new-onset confusion at home. Summary  For several hours after your procedure, you may feel tired. You may also be forgetful and have poor judgment.  Have a responsible adult stay with you for the time you are told. It is important to have someone help care for you until you  are awake and alert.  Rest as told. Do not drive or operate machinery. Do not drink alcohol or take sleeping pills.  Get help right away if you have trouble breathing, or if you suddenly become confused. This information is not intended to replace advice given to you by your health care provider. Make sure you discuss any questions you have with your health care provider. Document Revised: 03/11/2020 Document Reviewed: 05/29/2019 Elsevier Patient Education  2021 [employer].

## 2020-11-03 ENCOUNTER — Other Ambulatory Visit (HOSPITAL_COMMUNITY)
Admission: RE | Admit: 2020-11-03 | Discharge: 2020-11-03 | Disposition: A | Discharge status: Home / Self Care | Payer: Medicare Other | Source: Ambulatory Visit | Attending: Gastroenterology | Admitting: Gastroenterology

## 2020-11-03 ENCOUNTER — Encounter (HOSPITAL_COMMUNITY)
Admission: RE | Admit: 2020-11-03 | Discharge: 2020-11-03 | Disposition: A | Discharge status: Home / Self Care | Payer: Medicare Other | Source: Ambulatory Visit | Attending: Gastroenterology | Admitting: Gastroenterology

## 2020-11-03 ENCOUNTER — Other Ambulatory Visit: Payer: Self-pay

## 2020-11-03 DIAGNOSIS — Z01812 Encounter for preprocedural laboratory examination: Secondary | ICD-10-CM | POA: Insufficient documentation

## 2020-11-03 DIAGNOSIS — Z20822 Contact with and (suspected) exposure to covid-19: Secondary | ICD-10-CM | POA: Diagnosis not present

## 2020-11-03 LAB — BASIC METABOLIC PANEL
Anion gap: 11 (ref 5–15)
BUN: 12 mg/dL (ref 8–23)
CO2: 24 mmol/L (ref 22–32)
Calcium: 9.3 mg/dL (ref 8.9–10.3)
Chloride: 102 mmol/L (ref 98–111)
Creatinine, Ser: 0.86 mg/dL (ref 0.61–1.24)
GFR, Estimated: >60 mL/min (ref >60)
Glucose, Bld: 143 mg/dL — ABNORMAL HIGH (ref 70–99)
Potassium: 3.8 mmol/L (ref 3.5–5.1)
Sodium: 137 mmol/L (ref 135–145)

## 2020-11-03 LAB — SARS CORONAVIRUS 2 (TAT 6-24 HRS): SARS Coronavirus 2: NEGATIVE

## 2020-11-05 ENCOUNTER — Encounter (HOSPITAL_COMMUNITY): Payer: Self-pay | Admitting: Gastroenterology

## 2020-11-05 ENCOUNTER — Ambulatory Visit (HOSPITAL_COMMUNITY): Payer: Medicare Other | Admitting: Anesthesiology

## 2020-11-05 ENCOUNTER — Ambulatory Visit (HOSPITAL_COMMUNITY)
Admission: RE | Admit: 2020-11-05 | Discharge: 2020-11-05 | Disposition: A | Discharge status: Home / Self Care | Payer: Medicare Other | Attending: Gastroenterology | Admitting: Gastroenterology

## 2020-11-05 ENCOUNTER — Other Ambulatory Visit: Payer: Self-pay

## 2020-11-05 ENCOUNTER — Encounter (HOSPITAL_COMMUNITY)
Admission: RE | Disposition: A | Discharge status: Home / Self Care | Payer: Self-pay | Source: Home / Self Care | Attending: Gastroenterology

## 2020-11-05 DIAGNOSIS — J439 Emphysema, unspecified: Secondary | ICD-10-CM | POA: Diagnosis not present

## 2020-11-05 DIAGNOSIS — I10 Essential (primary) hypertension: Secondary | ICD-10-CM | POA: Diagnosis not present

## 2020-11-05 DIAGNOSIS — K635 Polyp of colon: Secondary | ICD-10-CM | POA: Diagnosis not present

## 2020-11-05 DIAGNOSIS — K2270 Barrett's esophagus without dysplasia: Secondary | ICD-10-CM

## 2020-11-05 DIAGNOSIS — K209 Esophagitis, unspecified without bleeding: Secondary | ICD-10-CM | POA: Insufficient documentation

## 2020-11-05 DIAGNOSIS — Z7951 Long term (current) use of inhaled steroids: Secondary | ICD-10-CM | POA: Insufficient documentation

## 2020-11-05 DIAGNOSIS — I255 Ischemic cardiomyopathy: Secondary | ICD-10-CM | POA: Diagnosis not present

## 2020-11-05 DIAGNOSIS — D124 Benign neoplasm of descending colon: Secondary | ICD-10-CM | POA: Insufficient documentation

## 2020-11-05 DIAGNOSIS — Z7982 Long term (current) use of aspirin: Secondary | ICD-10-CM | POA: Insufficient documentation

## 2020-11-05 DIAGNOSIS — Z7984 Long term (current) use of oral hypoglycemic drugs: Secondary | ICD-10-CM | POA: Diagnosis not present

## 2020-11-05 DIAGNOSIS — E119 Type 2 diabetes mellitus without complications: Secondary | ICD-10-CM | POA: Diagnosis not present

## 2020-11-05 DIAGNOSIS — Z79899 Other long term (current) drug therapy: Secondary | ICD-10-CM | POA: Diagnosis not present

## 2020-11-05 DIAGNOSIS — Z87891 Personal history of nicotine dependence: Secondary | ICD-10-CM | POA: Diagnosis not present

## 2020-11-05 DIAGNOSIS — Z880 Allergy status to penicillin: Secondary | ICD-10-CM | POA: Insufficient documentation

## 2020-11-05 DIAGNOSIS — R1310 Dysphagia, unspecified: Secondary | ICD-10-CM

## 2020-11-05 DIAGNOSIS — Z1211 Encounter for screening for malignant neoplasm of colon: Secondary | ICD-10-CM | POA: Diagnosis not present

## 2020-11-05 DIAGNOSIS — K208 Other esophagitis without bleeding: Secondary | ICD-10-CM | POA: Diagnosis not present

## 2020-11-05 DIAGNOSIS — I2510 Atherosclerotic heart disease of native coronary artery without angina pectoris: Secondary | ICD-10-CM | POA: Diagnosis not present

## 2020-11-05 DIAGNOSIS — R12 Heartburn: Secondary | ICD-10-CM | POA: Insufficient documentation

## 2020-11-05 HISTORY — PX: POLYPECTOMY: SHX5525

## 2020-11-05 HISTORY — PX: COLONOSCOPY WITH PROPOFOL: SHX5780

## 2020-11-05 HISTORY — PX: SAVORY DILATION: SHX5439

## 2020-11-05 HISTORY — PX: BIOPSY: SHX5522

## 2020-11-05 HISTORY — PX: ESOPHAGOGASTRODUODENOSCOPY (EGD) WITH PROPOFOL: SHX5813

## 2020-11-05 LAB — GLUCOSE, CAPILLARY
Glucose-Capillary: 128 mg/dL — ABNORMAL HIGH (ref 70–99)
Glucose-Capillary: 122 mg/dL — ABNORMAL HIGH (ref 70–99)

## 2020-11-05 SURGERY — COLONOSCOPY WITH PROPOFOL
Anesthesia: General

## 2020-11-05 MED ORDER — LIDOCAINE HCL (CARDIAC) PF 50 MG/5ML IV SOSY
PREFILLED_SYRINGE | INTRAVENOUS | Status: DC | PRN
  Administered 2020-11-05: 50 mg via INTRAVENOUS

## 2020-11-05 MED ORDER — PROPOFOL 10 MG/ML IV BOLUS
INTRAVENOUS | Status: DC | PRN
  Administered 2020-11-05: 50 mg via INTRAVENOUS
  Administered 2020-11-05: 80 mg via INTRAVENOUS
  Administered 2020-11-05: 100 mg via INTRAVENOUS
  Administered 2020-11-05: 30 mg via INTRAVENOUS
  Administered 2020-11-05 (×2): 20 mg via INTRAVENOUS

## 2020-11-05 MED ORDER — LACTATED RINGERS IV SOLN: INTRAVENOUS | Status: DC

## 2020-11-05 MED ORDER — STERILE WATER FOR IRRIGATION IR SOLN
Status: DC | PRN
  Administered 2020-11-05: 200 mL

## 2020-11-05 MED ORDER — PROPOFOL 500 MG/50ML IV EMUL
INTRAVENOUS | Status: DC | PRN
  Administered 2020-11-05: 100 ug/kg/min via INTRAVENOUS

## 2020-11-05 MED ORDER — EPHEDRINE SULFATE 50 MG/ML IJ SOLN
INTRAMUSCULAR | Status: DC | PRN
  Administered 2020-11-05: 5 mg via INTRAVENOUS
  Administered 2020-11-05 (×2): 10 mg via INTRAVENOUS
  Administered 2020-11-05: 5 mg via INTRAVENOUS

## 2020-11-05 NOTE — Anesthesia Preprocedure Evaluation (Signed)
Anesthesia Evaluation  Patient identified by MRN, date of birth, ID band Patient awake    Reviewed: Allergy & Precautions, NPO status , Patient's Chart, lab work & pertinent test results  Airway Mallampati: II  TM Distance: >3 FB Neck ROM: Full    Dental  (+) Dental Advisory Given, Missing, Chipped   Pulmonary asthma , pneumonia, resolved, COPD,  COPD inhaler, former smoker,    Pulmonary exam normal breath sounds clear to auscultation       Cardiovascular Exercise Tolerance: Good hypertension, Pt. on medications + CAD and + Past MI  Normal cardiovascular exam Rhythm:Regular Rate:Normal     Neuro/Psych negative neurological ROS  negative psych ROS   GI/Hepatic Neg liver ROS, GERD  Medicated and Controlled,  Endo/Other  diabetes, Well Controlled, Type 2, Oral Hypoglycemic Agents  Renal/GU negative Renal ROS     Musculoskeletal  (+) Arthritis ,   Abdominal   Peds  Hematology  (+) anemia ,   Anesthesia Other Findings   Reproductive/Obstetrics                            Anesthesia Physical Anesthesia Plan  ASA: III  Anesthesia Plan: General   Post-op Pain Management:    Induction: Intravenous  PONV Risk Score and Plan: Propofol infusion  Airway Management Planned: Nasal Cannula and Natural Airway  Additional Equipment:   Intra-op Plan:   Post-operative Plan:   Informed Consent: I have reviewed the patients History and Physical, chart, labs and discussed the procedure including the risks, benefits and alternatives for the proposed anesthesia with the patient or authorized representative who has indicated his/her understanding and acceptance.     Dental advisory given  Plan Discussed with: CRNA and Surgeon  Anesthesia Plan Comments:        Anesthesia Quick Evaluation

## 2020-11-05 NOTE — Interval H&P Note (Signed)
History and Physical Interval Note:  11/05/2020 8:26 AM Travis Beck is a 75 y.o. male with past medical history of ischemic cardiomyopathy, hypertension, diabetes, emphysema, asthma and arthritis, who presents for evaluation of dysphagia, heartburn and colorectal cancer screening.  The patient reports that he has not felt any more heartburn episodes since the last time he was seen in the clinic.  He has been taking his PPI compliantly.  States having occasional episodes of dysphagia when eating solid food intermittently but no food impactions.  No significant family history for colorectal cancer.  Last colonoscopy was performed 10 years ago.  BP (!) 156/83   Pulse 65   Temp 98.5 F (36.9 C) (Oral)   Resp 16   Ht 5\' 5"  (1.651 m)   Wt 70.8 kg   SpO2 98%   BMI 25.96 kg/m   GENERAL: The patient is AO x3, in no acute distress. HEENT: Head is normocephalic and atraumatic. EOMI are intact. Mouth is well hydrated and without lesions. NECK: Supple. No masses LUNGS: Clear to auscultation. No presence of rhonchi/wheezing/rales. Adequate chest expansion HEART: RRR, normal s1 and s2. ABDOMEN: Soft, nontender, no guarding, no peritoneal signs, and nondistended. BS +. No masses. EXTREMITIES: Without any cyanosis, clubbing, rash, lesions or edema. NEUROLOGIC: AOx3, no focal motor deficit. SKIN: no jaundice, no rashes  Travis Beck  has presented today for surgery, with the diagnosis of Screening Colonoscopy / Dysphagia.  The various methods of treatment have been discussed with the patient and family. After consideration of risks, benefits and other options for treatment, the patient has consented to  Procedure(s) with comments: COLONOSCOPY WITH PROPOFOL (N/A) - Am ESOPHAGOGASTRODUODENOSCOPY (EGD) WITH PROPOFOL (N/A) as a surgical intervention.  The patient's history has been reviewed, patient examined, no change in status, stable for surgery.  I have reviewed the patient's chart and  labs.  Questions were answered to the patient's satisfaction.     [name]

## 2020-11-05 NOTE — Anesthesia Postprocedure Evaluation (Signed)
Anesthesia Post Note  Patient: Travis Beck  Procedure(s) Performed: COLONOSCOPY WITH PROPOFOL (N/A ) ESOPHAGOGASTRODUODENOSCOPY (EGD) WITH PROPOFOL (N/A ) BIOPSY POLYPECTOMY SAVORY DILATION  Patient location during evaluation: Phase II Anesthesia Type: General Level of consciousness: awake and alert and oriented Pain management: pain level controlled Vital Signs Assessment: post-procedure vital signs reviewed and stable Respiratory status: spontaneous breathing and respiratory function stable Cardiovascular status: blood pressure returned to baseline and stable Postop Assessment: no apparent nausea or vomiting Anesthetic complications: no   No complications documented.   Last Vitals:  Vitals:   11/05/20 0748 11/05/20 0929  BP: (!) 156/83 (!) 110/52  Pulse: 65 72  Resp: 16 14  Temp: 36.9 C 36.7 C  SpO2: 98% 98%    Last Pain:  Vitals:   11/05/20 0929  TempSrc: Oral  PainSc: 0-No pain                 Kalliopi Coupland C Minnette Merida

## 2020-11-05 NOTE — Discharge Instructions (Signed)
Colonoscopy, Adult, Care After This sheet gives you information about how to care for yourself after your procedure. Your doctor may also give you more specific instructions. If you have problems or questions, call your doctor. What can I expect after the procedure? After the procedure, it is common to have:  A small amount of blood in your poop (stool) for 24 hours.  Some gas.  Mild cramping or bloating in your belly (abdomen). Follow these instructions at home: Eating and drinking  Drink enough fluid to keep your pee (urine) pale yellow.  Follow instructions from your doctor about what you cannot eat or drink.  Return to your normal diet as told by your doctor. Avoid heavy or fried foods that are hard to digest.   Activity  Rest as told by your doctor.  Do not sit for a long time without moving. Get up to take short walks every 1-2 hours. This is important. Ask for help if you feel weak or unsteady.  Return to your normal activities as told by your doctor. Ask your doctor what activities are safe for you. To help cramping and bloating:  Try walking around.  Put heat on your belly as told by your doctor. Use the heat source that your doctor recommends, such as a moist heat pack or a heating pad. ? Put a towel between your skin and the heat source. ? Leave the heat on for 20-30 minutes. ? Remove the heat if your skin turns bright red. This is very important if you are unable to feel pain, heat, or cold. You may have a greater risk of getting burned.   General instructions  If you were given a medicine to help you relax (sedative) during your procedure, it can affect you for many hours. Do not drive or use machinery until your doctor says that it is safe.  For the first 24 hours after the procedure: ? Do not sign important documents. ? Do not drink alcohol. ? Do your daily activities more slowly than normal. ? Eat foods that are soft and easy to digest.  Take  over-the-counter or prescription medicines only as told by your doctor.  Keep all follow-up visits as told by your doctor. This is important. Contact a doctor if:  You have blood in your poop 2-3 days after the procedure. Get help right away if:  You have more than a small amount of blood in your poop.  You see large clumps of tissue (blood clots) in your poop.  Your belly is swollen.  You feel like you may vomit (nauseous).  You vomit.  You have a fever.  You have belly pain that gets worse, and medicine does not help your pain. Summary  After the procedure, it is common to have a small amount of blood in your poop. You may also have mild cramping and bloating in your belly.  If you were given a medicine to help you relax (sedative) during your procedure, it can affect you for many hours. Do not drive or use machinery until your doctor says that it is safe.  Get help right away if you have a lot of blood in your poop, feel like you may vomit, have a fever, or have more belly pain. This information is not intended to replace advice given to you by your health care provider. Make sure you discuss any questions you have with your health care provider. Document Revised: 05/02/2019 Document Reviewed: 01/20/2019 Elsevier Patient Education  2021  [place] are being discharged to home.  Resume your previous diet.  We are waiting for your pathology results. Continue pantoprazole 40 mg qday.  Your physician has indicated that a repeat colonoscopy is not recommended due to your current age (38 years or older) for screening purposes.   Upper Endoscopy, Adult, Care After This sheet gives you information about how to care for yourself after your procedure. Your health care provider may also give you more specific instructions. If you have problems or questions, contact your health care provider. What can I expect after the procedure? After the procedure, it is common to have:  A  sore throat.  Mild stomach pain or discomfort.  Bloating.  Nausea. Follow these instructions at home:  Follow instructions from your health care provider about what to eat or drink after your procedure.  Return to your normal activities as told by your health care provider. Ask your health care provider what activities are safe for you.  Take over-the-counter and prescription medicines only as told by your health care provider.  If you were given a sedative during the procedure, it can affect you for several hours. Do not drive or operate machinery until your health care provider says that it is safe.  Keep all follow-up visits as told by your health care provider. This is important.   Contact a health care provider if you have:  A sore throat that lasts longer than one day.  Trouble swallowing. Get help right away if:  You vomit blood or your vomit looks like coffee grounds.  You have: ? A fever. ? Bloody, black, or tarry stools. ? A severe sore throat or you cannot swallow. ? Difficulty breathing. ? Severe pain in your chest or abdomen. Summary  After the procedure, it is common to have a sore throat, mild stomach discomfort, bloating, and nausea.  If you were given a sedative during the procedure, it can affect you for several hours. Do not drive or operate machinery until your health care provider says that it is safe.  Follow instructions from your health care provider about what to eat or drink after your procedure.  Return to your normal activities as told by your health care provider. This information is not intended to replace advice given to you by your health care provider. Make sure you discuss any questions you have with your health care provider. Document Revised: 06/24/2019 Document Reviewed: 11/26/2017 Elsevier Patient Education  2021 [employer].

## 2020-11-05 NOTE — Transfer of Care (Signed)
Immediate Anesthesia Transfer of Care Note  Patient: Malique Driskill  Procedure(s) Performed: COLONOSCOPY WITH PROPOFOL (N/A ) ESOPHAGOGASTRODUODENOSCOPY (EGD) WITH PROPOFOL (N/A ) BIOPSY POLYPECTOMY SAVORY DILATION  Patient Location: Short Stay  Anesthesia Type:General  Level of Consciousness: awake and patient cooperative  Airway & Oxygen Therapy: Patient Spontanous Breathing  Post-op Assessment: Report given to RN and Post -op Vital signs reviewed and stable  Post vital signs: Reviewed and stable  Last Vitals:  Vitals Value Taken Time  BP    Temp    Pulse    Resp    SpO2     SEE VITAL SIGN FLOW SHEET Last Pain:  Vitals:   11/05/20 0834  TempSrc:   PainSc: 0-No pain         Complications: No complications documented.

## 2020-11-05 NOTE — Anesthesia Procedure Notes (Signed)
Date/Time: 11/05/2020 8:32 AM Performed by: Vista Deck, CRNA Pre-anesthesia Checklist: Patient identified, Emergency Drugs available, Suction available, Timeout performed and Patient being monitored Patient Re-evaluated:Patient Re-evaluated prior to induction Oxygen Delivery Method: Nasal Cannula

## 2020-11-05 NOTE — Op Note (Signed)
[facility] Patient Name: Travis Beck Procedure Date: 11/05/2020 8:20 AM MRN: [id number] Date of Birth: 07/25/1945 Attending MD: Maylon Peppers ,  CSN: [id number] Age: 75 Admit Type: Outpatient Procedure:                Upper GI endoscopy Indications:              Dysphagia, Heartburn Providers:                Maylon Peppers, Lambert Mody, Raphael Gibney,                            Technician Referring MD:              Medicines:                Monitored Anesthesia Care Complications:            No immediate complications. Estimated Blood Loss:     Estimated blood loss: none. Procedure:                Pre-Anesthesia Assessment:                           - Prior to the procedure, a History and Physical                            was performed, and patient medications, allergies                            and sensitivities were reviewed. The patient's                            tolerance of previous anesthesia was reviewed.                           - The risks and benefits of the procedure and the                            sedation options and risks were discussed with the                            patient. All questions were answered and informed                            consent was obtained.                           - ASA Grade Assessment: III - A patient with severe                            systemic disease.                           After obtaining informed consent, the endoscope was                            passed under direct vision. Throughout the  procedure, the patient's blood pressure, pulse, and                            oxygen saturations were monitored continuously. The                            [id number]-H190 ([id number]) scope was introduced through the                            mouth, and advanced to the second part of duodenum.                            The upper GI endoscopy was accomplished without                             difficulty. The patient tolerated the procedure                            well. Scope In: 8:38:30 AM Scope Out: 8:53:06 AM Total Procedure Duration: 0 hours 14 minutes 36 seconds  Findings:      No endoscopic abnormality was evident in the esophagus to explain the       patient's complaint of dysphagia. It was decided, however, to proceed       with dilation of the entire esophagus. A guidewire was placed and the       scope was withdrawn. Dilation was performed with a Savary dilator with       no resistance at 18 mm. No mucosal disruption was observed upon       reinspection.      The esophagus and gastroesophageal junction were examined with white       light and narrow band imaging (NBI). There were esophageal mucosal       changes suspicious for short-segment Barrett's esophagus, classified as       Barrett's stage C0-M1 per Prague criteria. These changes involved the       mucosa at the upper extent of the gastric folds (40 cm from the       incisors) extending to the Z-line (39 cm from the incisors). One tongue       of salmon-colored mucosa was present from 39 to 40 cm. The maximum       longitudinal extent of these esophageal mucosal changes was 1 cm in       length. This was biopsied with a cold forceps for histology.      The entire examined stomach was normal.      The examined duodenum was normal. Impression:               - No endoscopic esophageal abnormality to explain                            patient's dysphagia. Esophagus dilated. Dilated.                           - Esophageal mucosal changes suspicious for                            short-segment Barrett's  esophagus, classified as                            Barrett's stage C0-M1 per Prague criteria. Biopsied.                           - Normal stomach.                           - Normal examined duodenum. Moderate Sedation:      Per Anesthesia Care Recommendation:           - Discharge patient to home  (ambulatory).                           - Resume previous diet.                           - Await pathology results.                           - Continue pantoprazole 40 mg qday. Procedure Code(s):        --- Professional ---                           [phone], Esophagogastroduodenoscopy, flexible,                            transoral; with insertion of guide wire followed by                            passage of dilator(s) through esophagus over guide                            wire                           43239, 37, Esophagogastroduodenoscopy, flexible,                            transoral; with biopsy, single or multiple Diagnosis Code(s):        --- Professional ---                           R13.10, Dysphagia, unspecified                           K22.70, Barrett's esophagus without dysplasia                           R12, Heartburn CPT copyright 2019 American Medical Association. All rights reserved. The codes documented in this report are preliminary and upon coder review may  be revised to meet current compliance requirements. Maylon Peppers, MD Maylon Peppers,  11/05/2020 8:59:21 AM This report has been signed electronically. Number of Addenda: 0

## 2020-11-05 NOTE — Op Note (Signed)
[facility] Patient Name: Travis Beck Procedure Date: 11/05/2020 8:57 AM MRN: [id number] Date of Birth: 1946/04/03 Attending MD: Maylon Peppers ,  CSN: [id number] Age: 75 Admit Type: Outpatient Procedure:                Colonoscopy Indications:              Screening for colorectal malignant neoplasm Providers:                Maylon Peppers, Lambert Mody Raphael Gibney,                            Technician Referring MD:              Medicines:                Monitored Anesthesia Care Complications:            No immediate complications. Estimated Blood Loss:     Estimated blood loss: none. Procedure:                Pre-Anesthesia Assessment:                           - Prior to the procedure, a History and Physical                            was performed, and patient medications, allergies                            and sensitivities were reviewed. The patient's                            tolerance of previous anesthesia was reviewed.                           - The risks and benefits of the procedure and the                            sedation options and risks were discussed with the                            patient. All questions were answered and informed                            consent was obtained.                           - ASA Grade Assessment: III - A patient with severe                            systemic disease.                           After obtaining informed consent, the colonoscope                            was passed under direct vision. Throughout the  procedure, the patient's blood pressure, pulse, and                            oxygen saturations were monitored continuously. The                            PCF-H190DL ([id number]) scope was introduced through                            the anus and advanced to the the cecum, identified                            by appendiceal orifice and ileocecal valve. The                             colonoscopy was performed without difficulty. The                            patient tolerated the procedure well. The quality                            of the bowel preparation was good. Scope In: 9:00:36 AM Scope Out: 9:23:01 AM Scope Withdrawal Time: 0 hours 15 minutes 30 seconds  Total Procedure Duration: 0 hours 22 minutes 25 seconds  Findings:      The perianal and digital rectal examinations were normal.      Two sessile polyps were found in the descending colon. The polyps were 4       mm in size. These polyps were removed with a cold snare. Resection and       retrieval were complete.      The retroflexed view of the distal rectum and anal verge was normal and       showed no anal or rectal abnormalities. Impression:               - Two 4 mm polyps in the descending colon, removed                            with a cold snare. Resected and retrieved.                           - The distal rectum and anal verge are normal on                            retroflexion view. Moderate Sedation:      Per Anesthesia Care Recommendation:           - Discharge patient to home (ambulatory).                           - Resume previous diet.                           - Await pathology results.                           -  Repeat colonoscopy is not recommended due to                            current age (79 years or older) for screening                            purposes. Procedure Code(s):        --- Professional ---                           [phone], Colonoscopy, flexible; with removal of                            tumor(s), polyp(s), or other lesion(s) by snare                            technique Diagnosis Code(s):        --- Professional ---                           Z12.11, Encounter for screening for malignant                            neoplasm of colon                           K63.5, Polyp of colon CPT copyright 2019 American Medical Association. All rights  reserved. The codes documented in this report are preliminary and upon coder review may  be revised to meet current compliance requirements. Maylon Peppers, MD Maylon Peppers,  11/05/2020 9:26:11 AM This report has been signed electronically. Number of Addenda: 0

## 2020-11-08 LAB — SURGICAL PATHOLOGY

## 2020-11-09 ENCOUNTER — Encounter (HOSPITAL_COMMUNITY): Payer: Self-pay | Admitting: Gastroenterology

## 2020-11-11 DIAGNOSIS — M542 Cervicalgia: Secondary | ICD-10-CM | POA: Diagnosis not present

## 2020-11-11 DIAGNOSIS — M545 Low back pain, unspecified: Secondary | ICD-10-CM | POA: Diagnosis not present

## 2020-11-11 DIAGNOSIS — M130 Polyarthritis, unspecified: Secondary | ICD-10-CM | POA: Diagnosis not present

## 2020-11-11 DIAGNOSIS — F419 Anxiety disorder, unspecified: Secondary | ICD-10-CM | POA: Diagnosis not present

## 2020-11-11 DIAGNOSIS — Z79891 Long term (current) use of opiate analgesic: Secondary | ICD-10-CM | POA: Diagnosis not present

## 2020-11-11 DIAGNOSIS — M25569 Pain in unspecified knee: Secondary | ICD-10-CM | POA: Diagnosis not present

## 2020-11-12 DIAGNOSIS — I10 Essential (primary) hypertension: Secondary | ICD-10-CM | POA: Diagnosis not present

## 2020-11-12 DIAGNOSIS — I700 Atherosclerosis of aorta: Secondary | ICD-10-CM | POA: Diagnosis not present

## 2020-11-12 DIAGNOSIS — Z299 Encounter for prophylactic measures, unspecified: Secondary | ICD-10-CM | POA: Diagnosis not present

## 2020-11-12 DIAGNOSIS — L0232 Furuncle of buttock: Secondary | ICD-10-CM | POA: Diagnosis not present

## 2020-11-12 DIAGNOSIS — J449 Chronic obstructive pulmonary disease, unspecified: Secondary | ICD-10-CM | POA: Diagnosis not present

## 2020-12-13 IMAGING — DX CHEST - 2 VIEW
2 series · 2 of 2 positions shown · non-contrast
Comparison: 01/25/2019

CLINICAL DATA: Cough

EXAM:
CHEST - 2 VIEW

[chest pa]
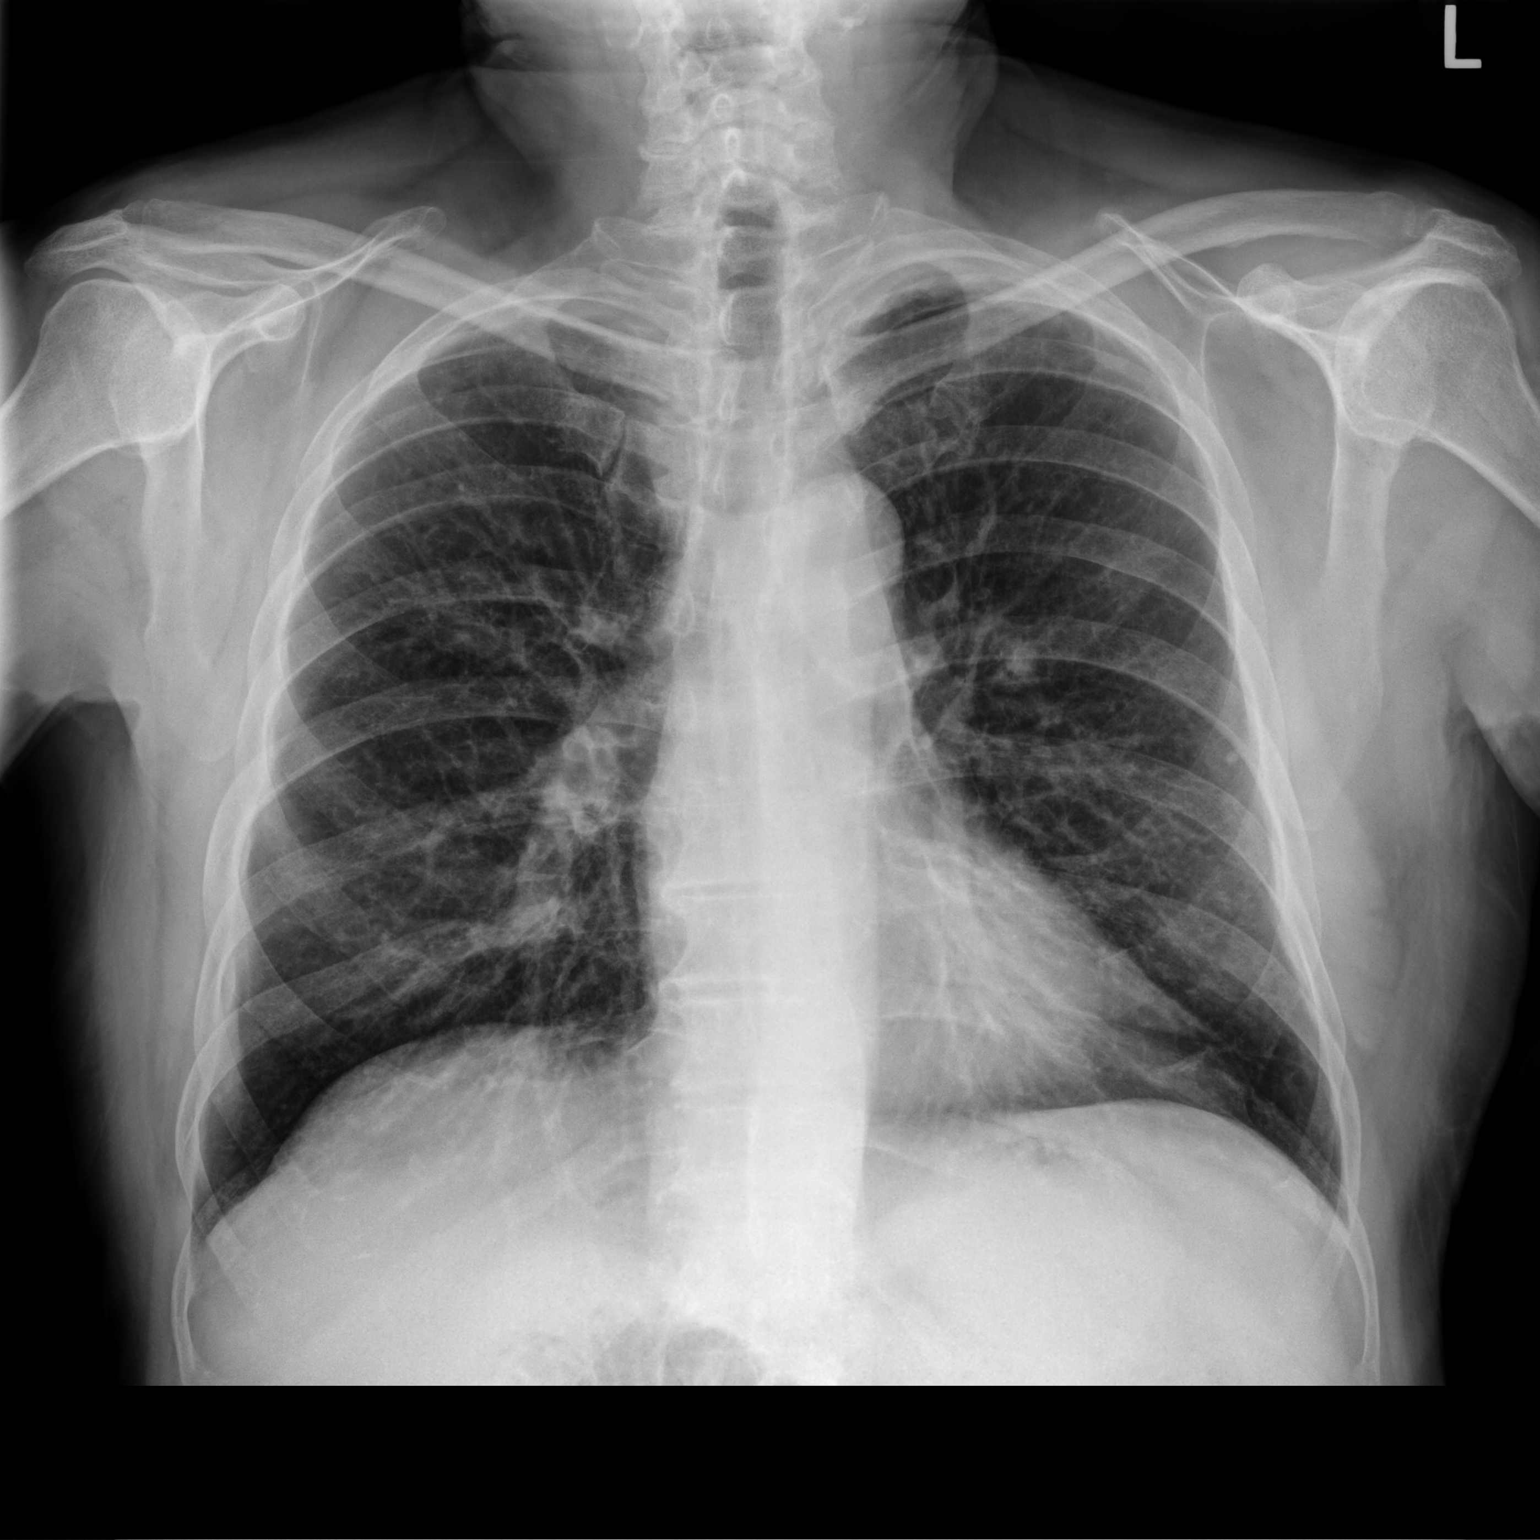

[chest lat]
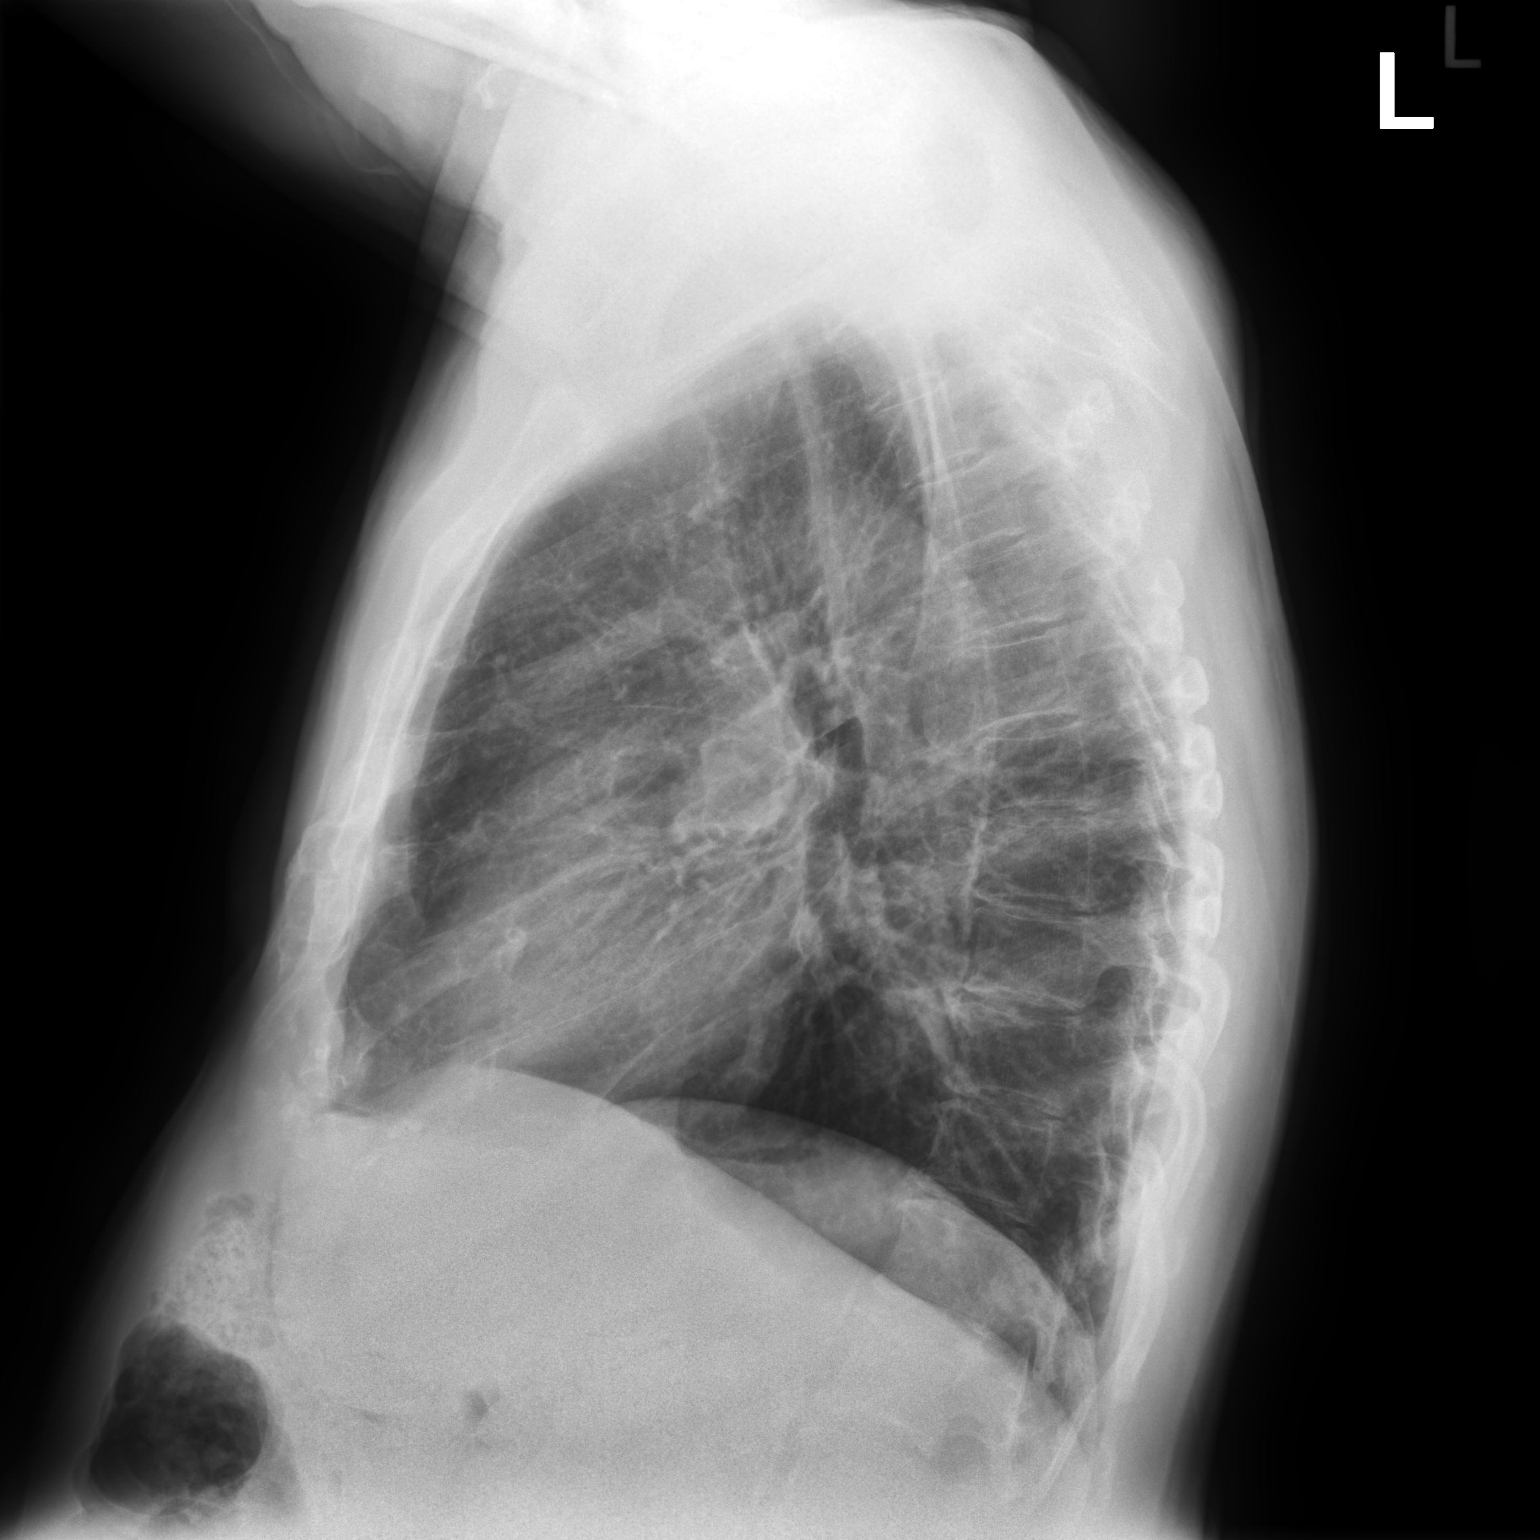

[2 of 2 positions shown; findings below may reference images not displayed]

FINDINGS: Normal heart size and mediastinal contours. There is no edema,
consolidation, effusion, or pneumothorax. Calcified left pulmonary
nodule in the mid lung. Coronary calcification is seen in the
lateral view.
IMPRESSION: No evidence of active disease.

## 2020-12-15 ENCOUNTER — Other Ambulatory Visit (INDEPENDENT_AMBULATORY_CARE_PROVIDER_SITE_OTHER): Payer: Self-pay | Admitting: Gastroenterology

## 2020-12-15 MED ORDER — PANTOPRAZOLE SODIUM 40 MG PO TBEC: 1.0000 | DELAYED_RELEASE_TABLET | Freq: Every day | ORAL | 3 refills | Status: AC

## 2020-12-15 NOTE — Addendum Note (Signed)
Addended by: Harvel Quale on: 12/15/2020 11:19 AM   Modules accepted: Orders

## 2020-12-27 DIAGNOSIS — F329 Major depressive disorder, single episode, unspecified: Secondary | ICD-10-CM | POA: Diagnosis not present

## 2021-01-03 DIAGNOSIS — I10 Essential (primary) hypertension: Secondary | ICD-10-CM | POA: Diagnosis not present

## 2021-01-03 DIAGNOSIS — Z87891 Personal history of nicotine dependence: Secondary | ICD-10-CM | POA: Diagnosis not present

## 2021-01-03 DIAGNOSIS — E1165 Type 2 diabetes mellitus with hyperglycemia: Secondary | ICD-10-CM | POA: Diagnosis not present

## 2021-01-03 DIAGNOSIS — Z299 Encounter for prophylactic measures, unspecified: Secondary | ICD-10-CM | POA: Diagnosis not present

## 2021-01-03 DIAGNOSIS — J441 Chronic obstructive pulmonary disease with (acute) exacerbation: Secondary | ICD-10-CM | POA: Diagnosis not present

## 2021-01-28 DIAGNOSIS — E1129 Type 2 diabetes mellitus with other diabetic kidney complication: Secondary | ICD-10-CM | POA: Diagnosis not present

## 2021-01-28 DIAGNOSIS — I10 Essential (primary) hypertension: Secondary | ICD-10-CM | POA: Diagnosis not present

## 2021-01-28 DIAGNOSIS — J441 Chronic obstructive pulmonary disease with (acute) exacerbation: Secondary | ICD-10-CM | POA: Diagnosis not present

## 2021-01-28 DIAGNOSIS — I700 Atherosclerosis of aorta: Secondary | ICD-10-CM | POA: Diagnosis not present

## 2021-01-28 DIAGNOSIS — E1165 Type 2 diabetes mellitus with hyperglycemia: Secondary | ICD-10-CM | POA: Diagnosis not present

## 2021-01-28 DIAGNOSIS — Z299 Encounter for prophylactic measures, unspecified: Secondary | ICD-10-CM | POA: Diagnosis not present

## 2021-02-03 DIAGNOSIS — M25569 Pain in unspecified knee: Secondary | ICD-10-CM | POA: Diagnosis not present

## 2021-02-03 DIAGNOSIS — M130 Polyarthritis, unspecified: Secondary | ICD-10-CM | POA: Diagnosis not present

## 2021-02-03 DIAGNOSIS — Z79891 Long term (current) use of opiate analgesic: Secondary | ICD-10-CM | POA: Diagnosis not present

## 2021-02-03 DIAGNOSIS — M542 Cervicalgia: Secondary | ICD-10-CM | POA: Diagnosis not present

## 2021-02-03 DIAGNOSIS — M545 Low back pain, unspecified: Secondary | ICD-10-CM | POA: Diagnosis not present

## 2021-02-03 DIAGNOSIS — F419 Anxiety disorder, unspecified: Secondary | ICD-10-CM | POA: Diagnosis not present

## 2021-02-14 IMAGING — CT CT CHEST W/O CM
2 of 4 series · 15 of 36 positions shown, 18 images · non-contrast
Comparison: 04/25/2018

CLINICAL DATA: Follow-up indeterminate pulmonary nodules.

EXAM:
CT CHEST WITHOUT CONTRAST
TECHNIQUE: Multidetector CT imaging of the chest was performed following the
standard protocol without IV contrast.

[Series 2: routine chest without · axial · non-contrast · 0.67mm/px · z∈[+1138,+1402]mm · 12 of 157 slices shown, 15 images]
[im 13/157  mediastinal]
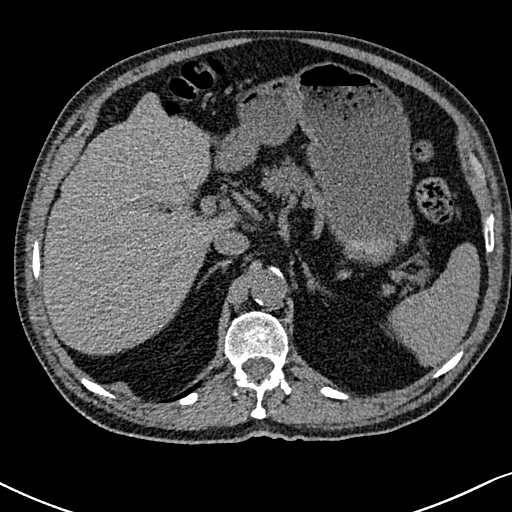
[im 13/157  lung]
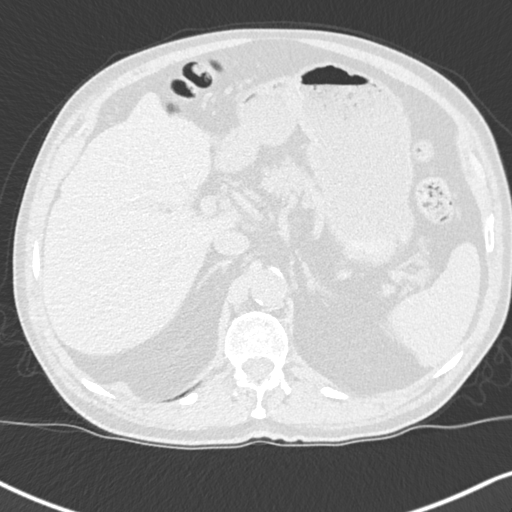
[im 25/157  lung]
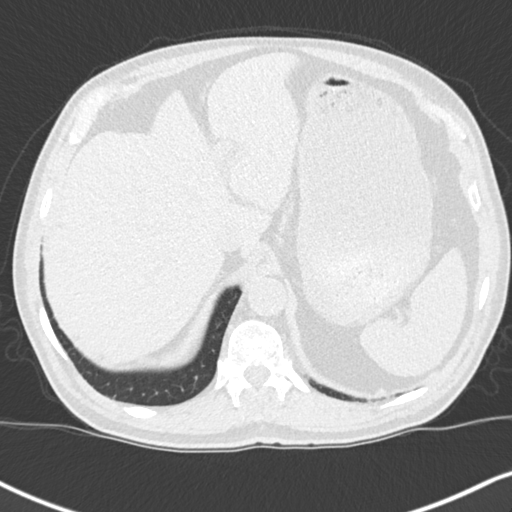
[im 37/157  lung]
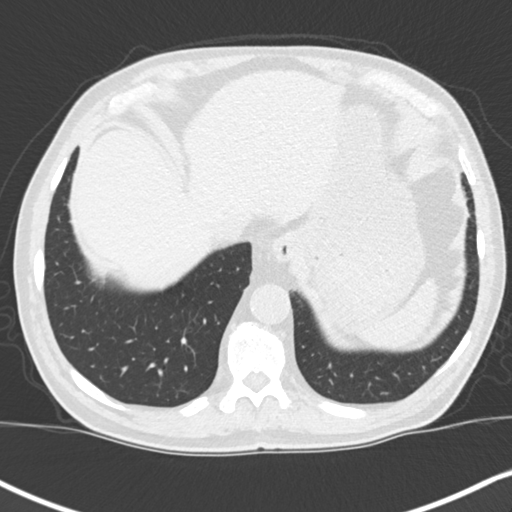
[im 49/157  lung]
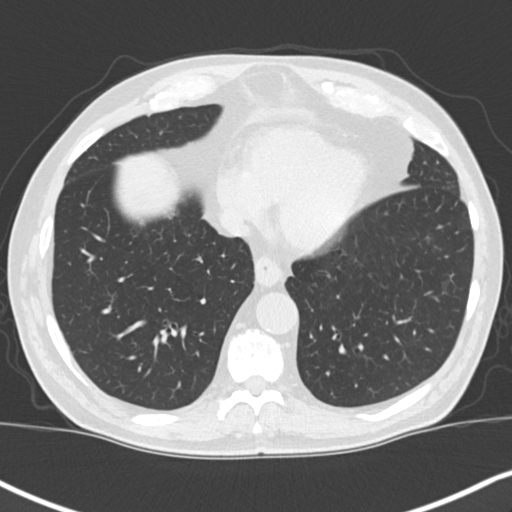
[im 61/157  mediastinal]
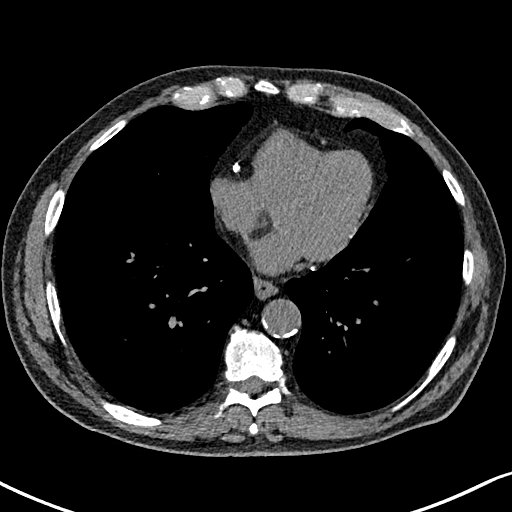
[im 61/157  lung]
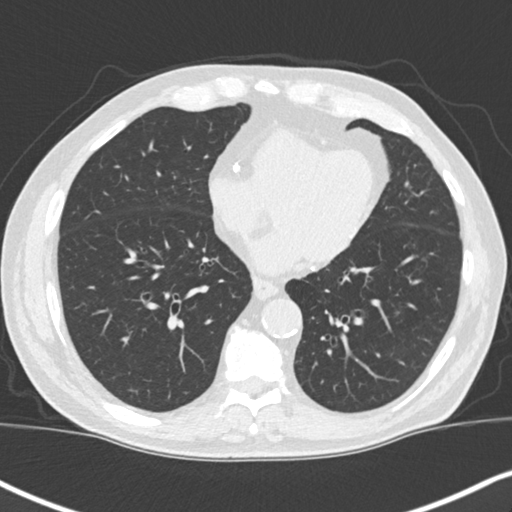
[im 73/157  lung]
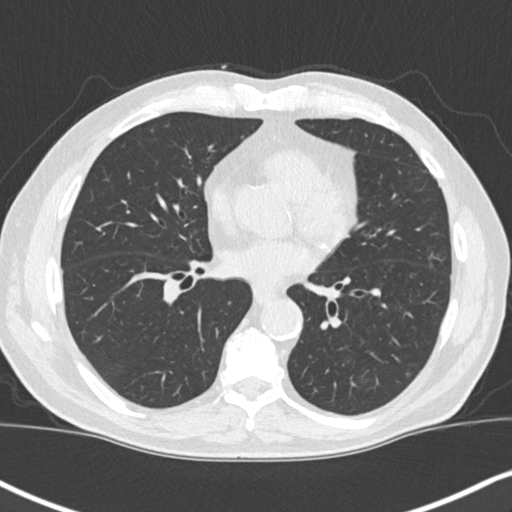
[im 85/157  lung]
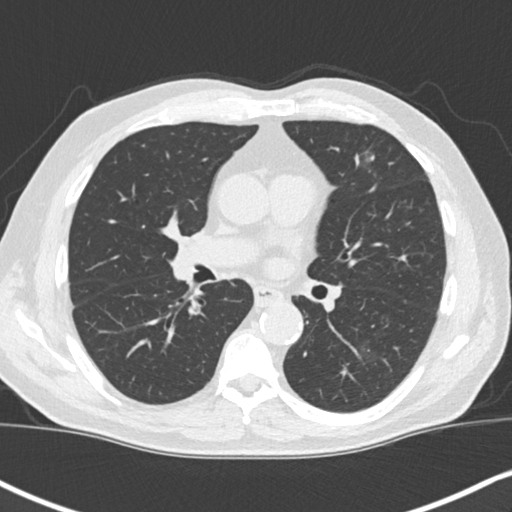
[im 97/157  lung]
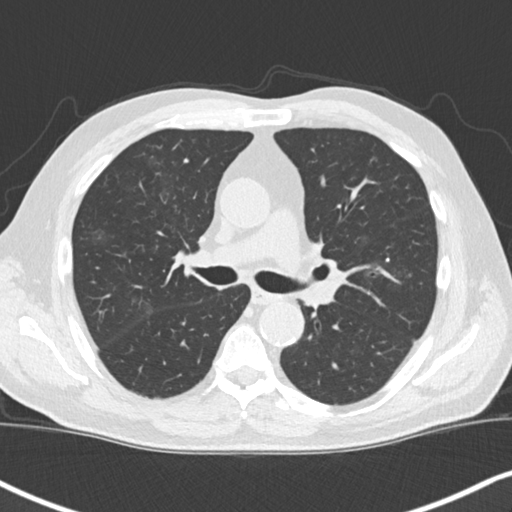
[im 109/157  mediastinal]
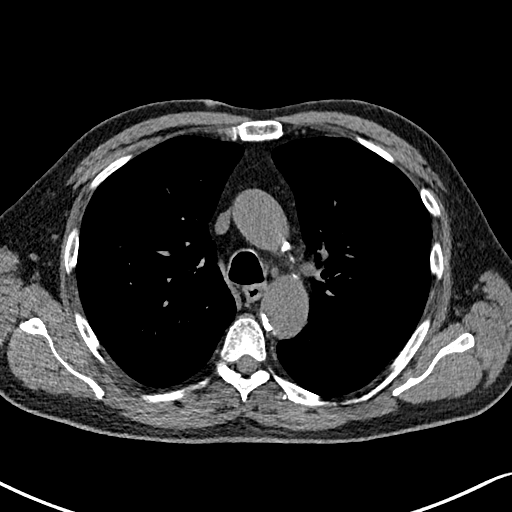
[im 109/157  lung]
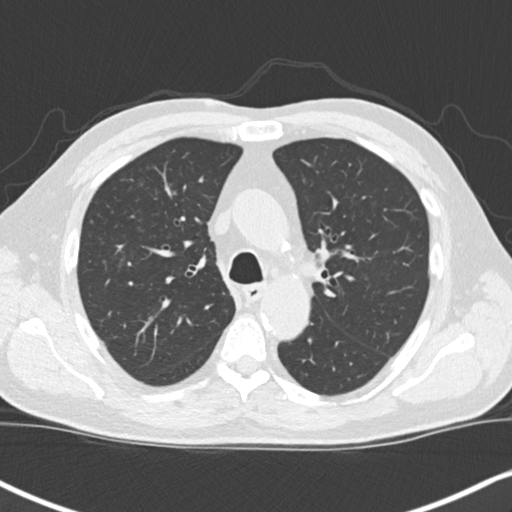
[im 121/157  lung]
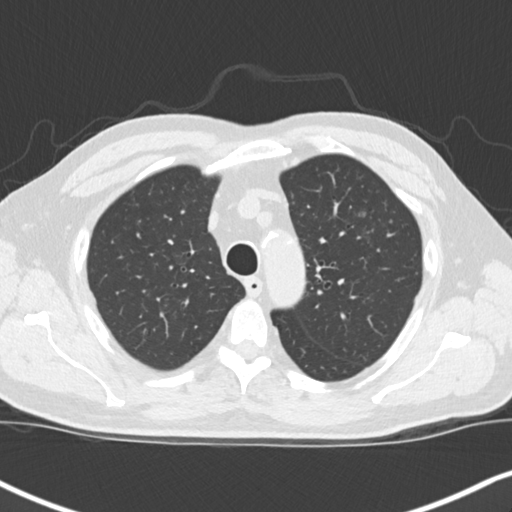
[im 133/157  lung]
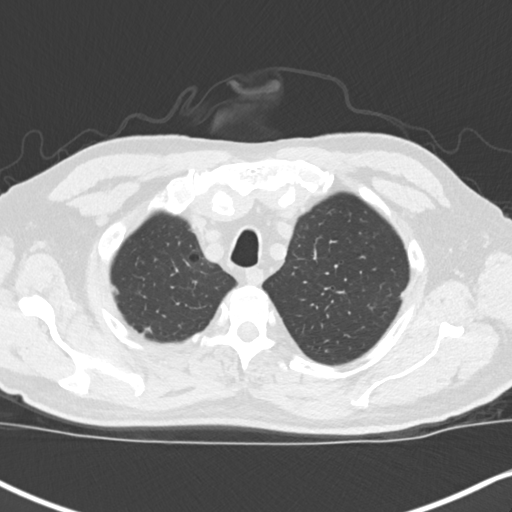
[im 145/157  lung]
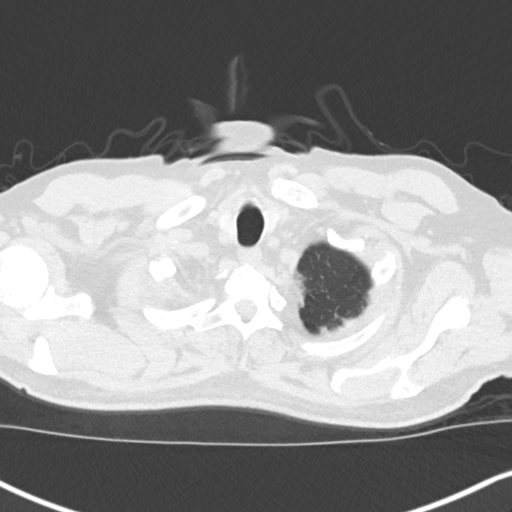

[Series 5: coronal · coronal · 0.63mm/px · 3 of 151 slices shown]
[im 31/151  lung]
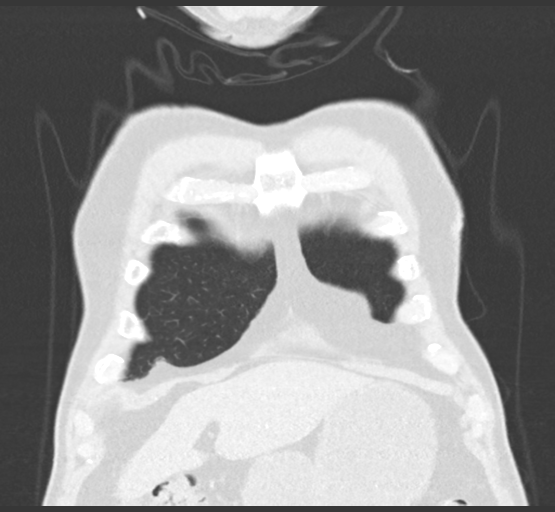
[im 61/151  lung]
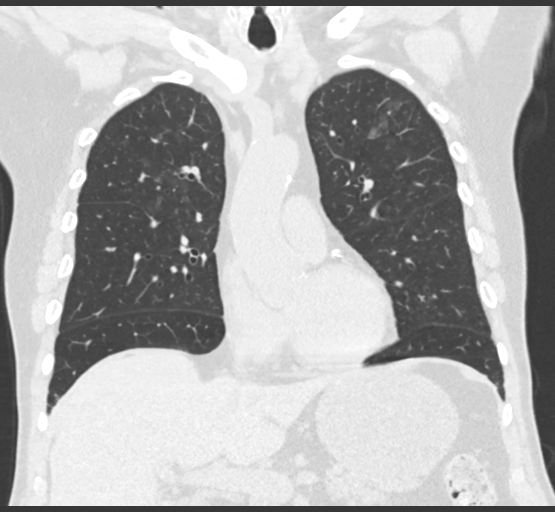
[im 91/151  lung]
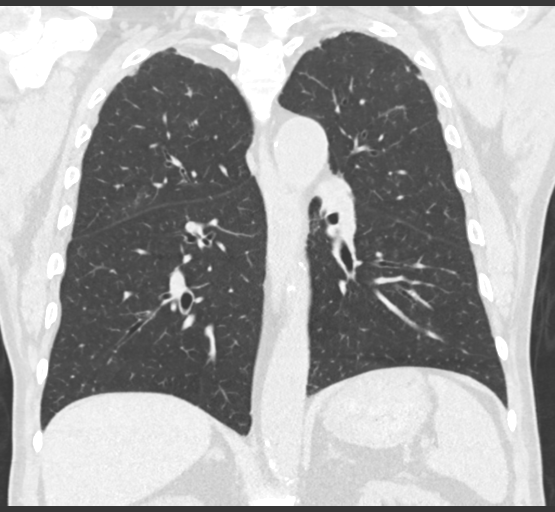

[15 of 36 positions shown; findings below may reference images not displayed]

FINDINGS: Cardiovascular: No acute findings. Aortic and coronary artery
atherosclerosis.

Mediastinum/Nodes: No masses or pathologically enlarged lymph nodes
identified on this unenhanced exam.

Lungs/Pleura: Scattered subcentimeter nodules and tree-in-bud
opacities are again seen in both lungs. Multiple patchy areas of
ground glass opacity are seen in both lungs predominantly in the
upper lobes, which have increased since previous study. These
findings are most consistent with a waxing and waning atypical
infectious or inflammatory process. No solid masses are identified.
No evidence of pleural effusion.

Upper Abdomen:  Unremarkable.

Musculoskeletal:  No suspicious bone lesions.
IMPRESSION: Scattered subcentimeter nodules and tree-in-bud opacities in both
lungs remain stable, however multiple patchy areas of ground-glass
opacity with upper lobe predominance have increased since previous
study. These findings are most consistent with a waxing and waning
atypical infectious or inflammatory process .

No evidence of lymphadenopathy or pleural effusion.

Aortic Atherosclerosis ([id number]). Coronary artery
atherosclerosis.

## 2021-02-23 DIAGNOSIS — Z299 Encounter for prophylactic measures, unspecified: Secondary | ICD-10-CM | POA: Diagnosis not present

## 2021-02-23 DIAGNOSIS — Z720 Tobacco use: Secondary | ICD-10-CM | POA: Diagnosis not present

## 2021-02-23 DIAGNOSIS — I10 Essential (primary) hypertension: Secondary | ICD-10-CM | POA: Diagnosis not present

## 2021-02-23 DIAGNOSIS — J441 Chronic obstructive pulmonary disease with (acute) exacerbation: Secondary | ICD-10-CM | POA: Diagnosis not present

## 2021-02-23 DIAGNOSIS — E1165 Type 2 diabetes mellitus with hyperglycemia: Secondary | ICD-10-CM | POA: Diagnosis not present

## 2021-02-23 DIAGNOSIS — Z6825 Body mass index (BMI) 25.0-25.9, adult: Secondary | ICD-10-CM | POA: Diagnosis not present

## 2021-02-23 DIAGNOSIS — K5900 Constipation, unspecified: Secondary | ICD-10-CM | POA: Diagnosis not present

## 2021-02-23 DIAGNOSIS — Z87891 Personal history of nicotine dependence: Secondary | ICD-10-CM | POA: Diagnosis not present

## 2021-03-17 DIAGNOSIS — I10 Essential (primary) hypertension: Secondary | ICD-10-CM | POA: Diagnosis not present

## 2021-03-17 DIAGNOSIS — Z299 Encounter for prophylactic measures, unspecified: Secondary | ICD-10-CM | POA: Diagnosis not present

## 2021-03-17 DIAGNOSIS — E785 Hyperlipidemia, unspecified: Secondary | ICD-10-CM | POA: Diagnosis not present

## 2021-03-17 DIAGNOSIS — R42 Dizziness and giddiness: Secondary | ICD-10-CM | POA: Diagnosis not present

## 2021-03-17 DIAGNOSIS — E1165 Type 2 diabetes mellitus with hyperglycemia: Secondary | ICD-10-CM | POA: Diagnosis not present

## 2021-03-31 DIAGNOSIS — Z6824 Body mass index (BMI) 24.0-24.9, adult: Secondary | ICD-10-CM | POA: Diagnosis not present

## 2021-03-31 DIAGNOSIS — I10 Essential (primary) hypertension: Secondary | ICD-10-CM | POA: Diagnosis not present

## 2021-03-31 DIAGNOSIS — Z299 Encounter for prophylactic measures, unspecified: Secondary | ICD-10-CM | POA: Diagnosis not present

## 2021-03-31 DIAGNOSIS — M549 Dorsalgia, unspecified: Secondary | ICD-10-CM | POA: Diagnosis not present

## 2021-03-31 DIAGNOSIS — Z23 Encounter for immunization: Secondary | ICD-10-CM | POA: Diagnosis not present

## 2021-03-31 DIAGNOSIS — E1165 Type 2 diabetes mellitus with hyperglycemia: Secondary | ICD-10-CM | POA: Diagnosis not present

## 2021-04-04 ENCOUNTER — Telehealth: Payer: Self-pay

## 2021-04-04 NOTE — Telephone Encounter (Signed)
Spoke with pt and reminded him to bring his Covid vaccination card to appointments tomorrow

## 2021-04-05 ENCOUNTER — Ambulatory Visit (INDEPENDENT_AMBULATORY_CARE_PROVIDER_SITE_OTHER): Payer: Medicare Other | Admitting: Pulmonary Disease

## 2021-04-05 ENCOUNTER — Other Ambulatory Visit: Payer: Self-pay

## 2021-04-05 VITALS — BP 116/66 | HR 59 | Ht 66.0 in | Wt 145.8 lb

## 2021-04-05 DIAGNOSIS — J439 Emphysema, unspecified: Secondary | ICD-10-CM | POA: Diagnosis not present

## 2021-04-05 DIAGNOSIS — I259 Chronic ischemic heart disease, unspecified: Secondary | ICD-10-CM

## 2021-04-05 DIAGNOSIS — R0602 Shortness of breath: Secondary | ICD-10-CM

## 2021-04-05 DIAGNOSIS — J4540 Moderate persistent asthma, uncomplicated: Secondary | ICD-10-CM | POA: Diagnosis not present

## 2021-04-05 DIAGNOSIS — J849 Interstitial pulmonary disease, unspecified: Secondary | ICD-10-CM | POA: Diagnosis not present

## 2021-04-05 LAB — PULMONARY FUNCTION TEST
DL/VA % pred: 90 %
DL/VA: 3.65 ml/min/mmHg/L
DLCO cor % pred: 83 %
DLCO cor: 17.62 ml/min/mmHg
DLCO unc % pred: 83 %
DLCO unc: 17.62 ml/min/mmHg
FEF 25-75 Post: 1.91 L/sec
FEF 25-75 Pre: 1.5 L/sec
FEF2575-%Change-Post: 27 %
FEF2575-%Pred-Post: 114 %
FEF2575-%Pred-Pre: 89 %
FEV1-%Change-Post: 5 %
FEV1-%Pred-Post: 97 %
FEV1-%Pred-Pre: 92 %
FEV1-Post: 2.32 L
FEV1-Pre: 2.2 L
FEV1FVC-%Change-Post: 6 %
FEV1FVC-%Pred-Pre: 99 %
FEV6-%Change-Post: 0 %
FEV6-%Pred-Post: 97 %
FEV6-%Pred-Pre: 98 %
FEV6-Post: 3.02 L
FEV6-Pre: 3.05 L
FEV6FVC-%Change-Post: 0 %
FEV6FVC-%Pred-Post: 107 %
FEV6FVC-%Pred-Pre: 107 %
FVC-%Change-Post: -1 %
FVC-%Pred-Post: 90 %
FVC-%Pred-Pre: 91 %
FVC-Post: 3.02 L
FVC-Pre: 3.07 L
Post FEV1/FVC ratio: 77 %
Post FEV6/FVC ratio: 100 %
Pre FEV1/FVC ratio: 72 %
Pre FEV6/FVC Ratio: 99 %

## 2021-04-05 MED ORDER — TRELEGY ELLIPTA 100-62.5-25 MCG/INH IN AEPB: 1.0000 | INHALATION_SPRAY | Freq: Every day | RESPIRATORY_TRACT | 0 refills | Status: DC

## 2021-04-05 NOTE — Patient Instructions (Signed)
I am glad you are doing well with your breathing Continue the Trelegy inhaler Follow-up in 6 months

## 2021-04-05 NOTE — Progress Notes (Signed)
PFT done today. 

## 2021-04-05 NOTE — Progress Notes (Signed)
Travis Beck    [id number]    10-06-1945  Primary Care Physician:Shah, Weldon Picking, MD  Referring Physician: Monico Blitz, [address] [place],  [place] [address]  Chief complaint: Follow up for asthma, emphysema, chronic bronchitis, abnormal CT scan  HPI: 75 year old with history of allergic rhinitis, hyperlipidemia, type 2 diabetes, former smoker Treated for pneumonia in May-June 2019.  He required 3 rounds of antibiotic to finally get symptoms under control.  He had imaging including CT of the chest which showed bilateral infiltrates, nodular opacities and has been referred here for further evaluation.  Follow-up CT shows resolution of infiltrate.  Evaluated by Dr. Ernst Bowler, allergy Also follows with Dr. Melony Overly for esophagitis, benign stricture and Schatzki's ring  Pets: Has a dog, no cats, birds, farm animals Occupation: Used to work from [employer].  Currently retired Exposures: No known exposures, no leak, hot tub, Jacuzzi, mold Smoking history: 35-pack-year smoker.  Quit in 1994 Travel history: No significant travel.  Interim history: Reports a reaction to covid booster 6 months ago. He continues to have good response to Trelegy. Breathing has been stable. Using his rescue inhaler about once a day. No recent bronchitis.   Feeling well overall. States he needs some samples of Trelegy.   Outpatient Encounter Medications as of 04/05/2021  Medication Sig   acetaminophen (TYLENOL) 325 MG tablet Take 2 tablets (650 mg total) by mouth every 6 (six) hours as needed for mild pain (or Fever >/= 101).   albuterol (VENTOLIN HFA) 108 (90 Base) MCG/ACT inhaler Inhale 2 puffs into the lungs every 4 (four) hours as needed for wheezing or shortness of breath.   ALPRAZolam (XANAX) 1 MG tablet Take 1 tablet (1 mg total) by mouth 3 (three) times daily as needed for anxiety.   amLODipine (NORVASC) 2.5 MG tablet Take 1 tablet (2.5 mg total) by mouth daily. For BP   aspirin EC 81 MG  tablet Take 1 tablet (81 mg total) by mouth daily. Swallow whole.   buprenorphine (SUBUTEX) 8 MG SUBL SL tablet Place 8 mg under the tongue daily.   cetirizine (ZYRTEC) 10 MG tablet Take 10 mg by mouth as needed for allergies (itching).   fluticasone (FLONASE) 50 MCG/ACT nasal spray Place 2 sprays into both nostrils daily.    gabapentin (NEURONTIN) 400 MG capsule Take 1 capsule (400 mg total) by mouth every 8 (eight) hours as needed.   glyBURIDE (DIABETA) 5 MG tablet Take 10 mg by mouth 2 (two) times daily with a meal.    guaiFENesin (MUCINEX) 600 MG 12 hr tablet Take 1 tablet (600 mg total) by mouth 2 (two) times daily.   ipratropium-albuterol (DUONEB) 0.5-2.5 (3) MG/3ML SOLN Inhale 3 mLs into the lungs every 6 (six) hours as needed.   levocetirizine (XYZAL) 5 MG tablet Take 1 tablet by mouth. occasionally   metFORMIN (GLUCOPHAGE) 1000 MG tablet Take 1,000 mg by mouth 2 (two) times daily with a meal.    pantoprazole (PROTONIX) 40 MG tablet Take 1 tablet (40 mg total) by mouth daily.   polyethylene glycol (MIRALAX / GLYCOLAX) 17 g packet Take 17 g by mouth 2 (two) times daily.   rosuvastatin (CRESTOR) 20 MG tablet TAKE 1 TABLET BY MOUTH DAILY   TRELEGY ELLIPTA 100-62.5-25 MCG/INH AEPB inhale ONE PUFF into THE lungs DAILY   triamcinolone cream (KENALOG) 0.1 % Apply 1 application topically daily as needed.   valsartan (DIOVAN) 40 MG tablet Take 1 tablet (40 mg  total) by mouth daily. For Heart and blood pressure   [DISCONTINUED] Fluticasone-Umeclidin-Vilant (TRELEGY ELLIPTA) 100-62.5-25 MCG/INH AEPB Inhale 1 puff into the lungs daily.   No facility-administered encounter medications on file as of 04/05/2021.   Physical Exam: Blood pressure 118/66, pulse 76, temperature 98.5 F (36.9 C), temperature source Temporal, height 5\' 5"  (1.651 m), weight 151 lb (68.5 kg), SpO2 95 %. Gen:      Well-appearing. No acute distress HEENT:  EOMI, sclera anicteric Neck:     No masses; no thyromegaly Lungs:     Clear to auscultation bilaterally; normal respiratory effort. Mild expiratory wheezes. No rales.  CV:         Regular rate and rhythm; no murmurs Abd:      + bowel sounds; soft, non-tender; no palpable masses, no distension Ext:    No edema; adequate peripheral perfusion Skin:      Warm and dry; no rash Neuro: alert and oriented x 3 Psych: normal mood and affect  Data Reviewed: Imaging CT chest 12/21/2017- bilateral peribronchovascular nodularity, groundglass opacities left greater than right.  Dominant 1.1 cm left upper lobe nodule.  Mild centrilobular, paraseptal emphysema.  Left main and three-vessel coronary atherosclerosis.  Small hiatal hernia.    CT chest 04/25/2018- improvement in left upper lobe patchy airspace opacities, mild tree-in-bud in the upper lobes and lower lobes.  Peripheral subcentimeter pulmonary nodules.,  Aortic atherosclerosis  CT scan 04/22/2019-scattered subcentimeter nodules, tree-in-bud opacities.  Multiple patchy areas of groundglass in the upper lobe.   HRCT 09/23/20-no evidence of interstitial lung disease I have reviewed the images personally.  PFTs 04/17/2018 FVC 3.20 [92%), FEV1 2.45 [98%), F/F 77, TLC 125%, RV/DL 361%, DLCO 69% No obstruction, air trapping Mild reduction diffusion capacity.  04/05/2021 FVC 3.02 [90%], FEV1 2.32 [97%], F/F 77, TLC 4.57 [75%] DLCO 17.6 [83%] Normal test  ACT score 08/31/20- 17  Labs: CBC 05/11/2020-WBC 7.6, eos 8.1%, absolute eosinophil count 616 IgE 06/05/2020-160  Respiratory allergy profile-IgE 117, RAST panel- Negative Alpha-1 antitrypsin 02/18/2019-172, PI MM Mold profile 03/06/2019-negative  Sputum cultures 11/6-20-negative  Procedures: EGD 05/19/2019- - Scar in the distal esophagus. Healed esophagitis. - Benign-appearing esophageal stenosis proximal to GEJ. Dilated. - Non-obstructing Schatzki ring. - 3 cm hiatal hernia. - Normal stomach. - Normal duodenal bulb and second portion of the duodenum.    Assessment:  Asthma, emphysema, chronic bronchitis with exacerbation PFTs have not shown any significant obstruction but he does have emphysema and air trapping. Repeat PFT today shows normal spirometry but mildly reduced TLC.   Has a history of peripheral eosinophils.  There was concern for eosinophilic pneumonia but recent HRCT showed some postinfectious scarring but no evidence of ILD.   Is doing well on Trelegy inhaler and will continue the same  Chronic rhinitis, allergies Stable. Follows with Dr. Ernst Bowler  GERD, esophagitis, esophageal stricture Stable. Follows with GI and is on PPI therapy.  Health maintenance Up-to-date with Pneumovax, flu Has been vaccinated against Covid. Received the booster but had a reaction to it.   Plan/Recommendations: - Continue Trelegy, albuterol as needed - Give sample of Trelegy - Follow up in 6 months   Marshell Garfinkel MD [facility] 04/05/2021, 10:31 AM  CC: Monico Blitz, MD

## 2021-04-06 ENCOUNTER — Encounter: Payer: Self-pay | Admitting: Pulmonary Disease

## 2021-04-11 DIAGNOSIS — Z20828 Contact with and (suspected) exposure to other viral communicable diseases: Secondary | ICD-10-CM | POA: Diagnosis not present

## 2021-04-12 DIAGNOSIS — Z87891 Personal history of nicotine dependence: Secondary | ICD-10-CM | POA: Diagnosis not present

## 2021-04-12 DIAGNOSIS — M7989 Other specified soft tissue disorders: Secondary | ICD-10-CM | POA: Diagnosis not present

## 2021-04-12 DIAGNOSIS — Z7984 Long term (current) use of oral hypoglycemic drugs: Secondary | ICD-10-CM | POA: Diagnosis not present

## 2021-04-12 DIAGNOSIS — Z79899 Other long term (current) drug therapy: Secondary | ICD-10-CM | POA: Diagnosis not present

## 2021-04-12 DIAGNOSIS — R238 Other skin changes: Secondary | ICD-10-CM | POA: Diagnosis not present

## 2021-04-12 DIAGNOSIS — L249 Irritant contact dermatitis, unspecified cause: Secondary | ICD-10-CM | POA: Diagnosis not present

## 2021-04-12 DIAGNOSIS — M79622 Pain in left upper arm: Secondary | ICD-10-CM | POA: Diagnosis not present

## 2021-04-12 DIAGNOSIS — S40812A Abrasion of left upper arm, initial encounter: Secondary | ICD-10-CM | POA: Diagnosis not present

## 2021-04-12 DIAGNOSIS — E119 Type 2 diabetes mellitus without complications: Secondary | ICD-10-CM | POA: Diagnosis not present

## 2021-04-12 DIAGNOSIS — Z794 Long term (current) use of insulin: Secondary | ICD-10-CM | POA: Diagnosis not present

## 2021-04-13 ENCOUNTER — Other Ambulatory Visit: Payer: Self-pay | Admitting: Cardiology

## 2021-04-19 DIAGNOSIS — D485 Neoplasm of uncertain behavior of skin: Secondary | ICD-10-CM | POA: Diagnosis not present

## 2021-04-19 DIAGNOSIS — L089 Local infection of the skin and subcutaneous tissue, unspecified: Secondary | ICD-10-CM | POA: Diagnosis not present

## 2021-04-25 ENCOUNTER — Telehealth: Payer: Self-pay | Admitting: Cardiology

## 2021-04-25 MED ORDER — ROSUVASTATIN CALCIUM 20 MG PO TABS: 20.0000 mg | ORAL_TABLET | Freq: Every day | ORAL | 0 refills | Status: DC

## 2021-04-25 NOTE — Telephone Encounter (Signed)
Phone refill for Crestor 20 mg by mouth daily #90 no refills-need visit to [facility]

## 2021-04-25 NOTE — Telephone Encounter (Signed)
Patient is transferring medications to [facility].  [facility] called requesting information on Crestor 20 mg . Please call Cyril Mourning [phone]

## 2021-04-28 DIAGNOSIS — M25569 Pain in unspecified knee: Secondary | ICD-10-CM | POA: Diagnosis not present

## 2021-04-28 DIAGNOSIS — M130 Polyarthritis, unspecified: Secondary | ICD-10-CM | POA: Diagnosis not present

## 2021-04-28 DIAGNOSIS — F419 Anxiety disorder, unspecified: Secondary | ICD-10-CM | POA: Diagnosis not present

## 2021-04-28 DIAGNOSIS — Z79891 Long term (current) use of opiate analgesic: Secondary | ICD-10-CM | POA: Diagnosis not present

## 2021-04-28 DIAGNOSIS — M545 Low back pain, unspecified: Secondary | ICD-10-CM | POA: Diagnosis not present

## 2021-04-28 DIAGNOSIS — M542 Cervicalgia: Secondary | ICD-10-CM | POA: Diagnosis not present

## 2021-05-03 DIAGNOSIS — L570 Actinic keratosis: Secondary | ICD-10-CM | POA: Diagnosis not present

## 2021-05-10 DIAGNOSIS — J441 Chronic obstructive pulmonary disease with (acute) exacerbation: Secondary | ICD-10-CM | POA: Diagnosis not present

## 2021-05-10 DIAGNOSIS — Z87891 Personal history of nicotine dependence: Secondary | ICD-10-CM | POA: Diagnosis not present

## 2021-05-10 DIAGNOSIS — E1165 Type 2 diabetes mellitus with hyperglycemia: Secondary | ICD-10-CM | POA: Diagnosis not present

## 2021-05-10 DIAGNOSIS — R42 Dizziness and giddiness: Secondary | ICD-10-CM | POA: Diagnosis not present

## 2021-05-10 DIAGNOSIS — I10 Essential (primary) hypertension: Secondary | ICD-10-CM | POA: Diagnosis not present

## 2021-05-10 DIAGNOSIS — Z299 Encounter for prophylactic measures, unspecified: Secondary | ICD-10-CM | POA: Diagnosis not present

## 2021-06-06 DIAGNOSIS — Z1339 Encounter for screening examination for other mental health and behavioral disorders: Secondary | ICD-10-CM | POA: Diagnosis not present

## 2021-06-06 DIAGNOSIS — Z1331 Encounter for screening for depression: Secondary | ICD-10-CM | POA: Diagnosis not present

## 2021-06-06 DIAGNOSIS — Z6825 Body mass index (BMI) 25.0-25.9, adult: Secondary | ICD-10-CM | POA: Diagnosis not present

## 2021-06-06 DIAGNOSIS — R5383 Other fatigue: Secondary | ICD-10-CM | POA: Diagnosis not present

## 2021-06-06 DIAGNOSIS — I10 Essential (primary) hypertension: Secondary | ICD-10-CM | POA: Diagnosis not present

## 2021-06-06 DIAGNOSIS — Z299 Encounter for prophylactic measures, unspecified: Secondary | ICD-10-CM | POA: Diagnosis not present

## 2021-06-06 DIAGNOSIS — E7800 Pure hypercholesterolemia, unspecified: Secondary | ICD-10-CM | POA: Diagnosis not present

## 2021-06-06 DIAGNOSIS — Z0000 Encounter for general adult medical examination without abnormal findings: Secondary | ICD-10-CM | POA: Diagnosis not present

## 2021-06-06 DIAGNOSIS — Z79899 Other long term (current) drug therapy: Secondary | ICD-10-CM | POA: Diagnosis not present

## 2021-06-06 DIAGNOSIS — Z125 Encounter for screening for malignant neoplasm of prostate: Secondary | ICD-10-CM | POA: Diagnosis not present

## 2021-06-06 DIAGNOSIS — Z7189 Other specified counseling: Secondary | ICD-10-CM | POA: Diagnosis not present

## 2021-06-08 DIAGNOSIS — Z20828 Contact with and (suspected) exposure to other viral communicable diseases: Secondary | ICD-10-CM | POA: Diagnosis not present

## 2021-06-16 DIAGNOSIS — Z20822 Contact with and (suspected) exposure to covid-19: Secondary | ICD-10-CM | POA: Diagnosis not present

## 2021-06-27 DIAGNOSIS — F329 Major depressive disorder, single episode, unspecified: Secondary | ICD-10-CM | POA: Diagnosis not present

## 2021-07-08 ENCOUNTER — Other Ambulatory Visit: Payer: Self-pay | Admitting: Cardiology

## 2021-07-13 DIAGNOSIS — Z20828 Contact with and (suspected) exposure to other viral communicable diseases: Secondary | ICD-10-CM | POA: Diagnosis not present

## 2021-07-28 DIAGNOSIS — M130 Polyarthritis, unspecified: Secondary | ICD-10-CM | POA: Diagnosis not present

## 2021-07-28 DIAGNOSIS — M545 Low back pain, unspecified: Secondary | ICD-10-CM | POA: Diagnosis not present

## 2021-07-28 DIAGNOSIS — Z79891 Long term (current) use of opiate analgesic: Secondary | ICD-10-CM | POA: Diagnosis not present

## 2021-07-28 DIAGNOSIS — M542 Cervicalgia: Secondary | ICD-10-CM | POA: Diagnosis not present

## 2021-07-28 DIAGNOSIS — M25569 Pain in unspecified knee: Secondary | ICD-10-CM | POA: Diagnosis not present

## 2021-07-28 DIAGNOSIS — F419 Anxiety disorder, unspecified: Secondary | ICD-10-CM | POA: Diagnosis not present

## 2021-08-12 ENCOUNTER — Other Ambulatory Visit: Payer: Self-pay | Admitting: Pulmonary Disease

## 2021-08-12 DIAGNOSIS — Z20828 Contact with and (suspected) exposure to other viral communicable diseases: Secondary | ICD-10-CM | POA: Diagnosis not present

## 2021-08-24 DIAGNOSIS — Z6824 Body mass index (BMI) 24.0-24.9, adult: Secondary | ICD-10-CM | POA: Diagnosis not present

## 2021-08-24 DIAGNOSIS — E1129 Type 2 diabetes mellitus with other diabetic kidney complication: Secondary | ICD-10-CM | POA: Diagnosis not present

## 2021-08-24 DIAGNOSIS — R809 Proteinuria, unspecified: Secondary | ICD-10-CM | POA: Diagnosis not present

## 2021-08-24 DIAGNOSIS — Z299 Encounter for prophylactic measures, unspecified: Secondary | ICD-10-CM | POA: Diagnosis not present

## 2021-08-24 DIAGNOSIS — J019 Acute sinusitis, unspecified: Secondary | ICD-10-CM | POA: Diagnosis not present

## 2021-08-24 DIAGNOSIS — E1165 Type 2 diabetes mellitus with hyperglycemia: Secondary | ICD-10-CM | POA: Diagnosis not present

## 2021-08-24 DIAGNOSIS — I10 Essential (primary) hypertension: Secondary | ICD-10-CM | POA: Diagnosis not present

## 2021-08-26 DIAGNOSIS — M542 Cervicalgia: Secondary | ICD-10-CM | POA: Diagnosis not present

## 2021-08-26 DIAGNOSIS — F419 Anxiety disorder, unspecified: Secondary | ICD-10-CM | POA: Diagnosis not present

## 2021-08-26 DIAGNOSIS — M130 Polyarthritis, unspecified: Secondary | ICD-10-CM | POA: Diagnosis not present

## 2021-08-26 DIAGNOSIS — M25569 Pain in unspecified knee: Secondary | ICD-10-CM | POA: Diagnosis not present

## 2021-08-26 DIAGNOSIS — M545 Low back pain, unspecified: Secondary | ICD-10-CM | POA: Diagnosis not present

## 2021-08-26 DIAGNOSIS — Z79891 Long term (current) use of opiate analgesic: Secondary | ICD-10-CM | POA: Diagnosis not present

## 2021-09-01 ENCOUNTER — Other Ambulatory Visit: Payer: Self-pay | Admitting: Cardiology

## 2021-09-05 DIAGNOSIS — N179 Acute kidney failure, unspecified: Secondary | ICD-10-CM | POA: Diagnosis not present

## 2021-09-05 DIAGNOSIS — Z7984 Long term (current) use of oral hypoglycemic drugs: Secondary | ICD-10-CM | POA: Diagnosis not present

## 2021-09-05 DIAGNOSIS — Z8701 Personal history of pneumonia (recurrent): Secondary | ICD-10-CM | POA: Diagnosis not present

## 2021-09-05 DIAGNOSIS — K219 Gastro-esophageal reflux disease without esophagitis: Secondary | ICD-10-CM | POA: Diagnosis not present

## 2021-09-05 DIAGNOSIS — Z20822 Contact with and (suspected) exposure to covid-19: Secondary | ICD-10-CM | POA: Diagnosis not present

## 2021-09-05 DIAGNOSIS — R059 Cough, unspecified: Secondary | ICD-10-CM | POA: Diagnosis not present

## 2021-09-05 DIAGNOSIS — J189 Pneumonia, unspecified organism: Secondary | ICD-10-CM | POA: Diagnosis not present

## 2021-09-05 DIAGNOSIS — I10 Essential (primary) hypertension: Secondary | ICD-10-CM | POA: Diagnosis not present

## 2021-09-05 DIAGNOSIS — Z794 Long term (current) use of insulin: Secondary | ICD-10-CM | POA: Diagnosis not present

## 2021-09-05 DIAGNOSIS — A419 Sepsis, unspecified organism: Secondary | ICD-10-CM | POA: Diagnosis not present

## 2021-09-05 DIAGNOSIS — Z87891 Personal history of nicotine dependence: Secondary | ICD-10-CM | POA: Diagnosis not present

## 2021-09-05 DIAGNOSIS — J440 Chronic obstructive pulmonary disease with acute lower respiratory infection: Secondary | ICD-10-CM | POA: Diagnosis not present

## 2021-09-05 DIAGNOSIS — Z880 Allergy status to penicillin: Secondary | ICD-10-CM | POA: Diagnosis not present

## 2021-09-05 DIAGNOSIS — R079 Chest pain, unspecified: Secondary | ICD-10-CM | POA: Diagnosis not present

## 2021-09-05 DIAGNOSIS — Z8709 Personal history of other diseases of the respiratory system: Secondary | ICD-10-CM | POA: Diagnosis not present

## 2021-09-05 DIAGNOSIS — Z79899 Other long term (current) drug therapy: Secondary | ICD-10-CM | POA: Diagnosis not present

## 2021-09-05 DIAGNOSIS — E119 Type 2 diabetes mellitus without complications: Secondary | ICD-10-CM | POA: Diagnosis not present

## 2021-09-13 DIAGNOSIS — E119 Type 2 diabetes mellitus without complications: Secondary | ICD-10-CM | POA: Diagnosis not present

## 2021-09-13 DIAGNOSIS — Z7984 Long term (current) use of oral hypoglycemic drugs: Secondary | ICD-10-CM | POA: Diagnosis not present

## 2021-09-13 DIAGNOSIS — J189 Pneumonia, unspecified organism: Secondary | ICD-10-CM | POA: Diagnosis not present

## 2021-09-13 DIAGNOSIS — E785 Hyperlipidemia, unspecified: Secondary | ICD-10-CM | POA: Diagnosis not present

## 2021-09-13 DIAGNOSIS — I10 Essential (primary) hypertension: Secondary | ICD-10-CM | POA: Diagnosis not present

## 2021-09-13 DIAGNOSIS — Z792 Long term (current) use of antibiotics: Secondary | ICD-10-CM | POA: Diagnosis not present

## 2021-09-13 DIAGNOSIS — Z299 Encounter for prophylactic measures, unspecified: Secondary | ICD-10-CM | POA: Diagnosis not present

## 2021-09-13 DIAGNOSIS — Z87891 Personal history of nicotine dependence: Secondary | ICD-10-CM | POA: Diagnosis not present

## 2021-09-13 DIAGNOSIS — Z6823 Body mass index (BMI) 23.0-23.9, adult: Secondary | ICD-10-CM | POA: Diagnosis not present

## 2021-09-13 DIAGNOSIS — Z794 Long term (current) use of insulin: Secondary | ICD-10-CM | POA: Diagnosis not present

## 2021-09-18 IMAGING — DX DG CHEST 1V PORT
1 series · 1 of 1 positions shown · non-contrast
Comparison: February 18, 2019.

CLINICAL DATA: Fever, cough.

EXAM:
PORTABLE CHEST 1 VIEW

[chest ap]
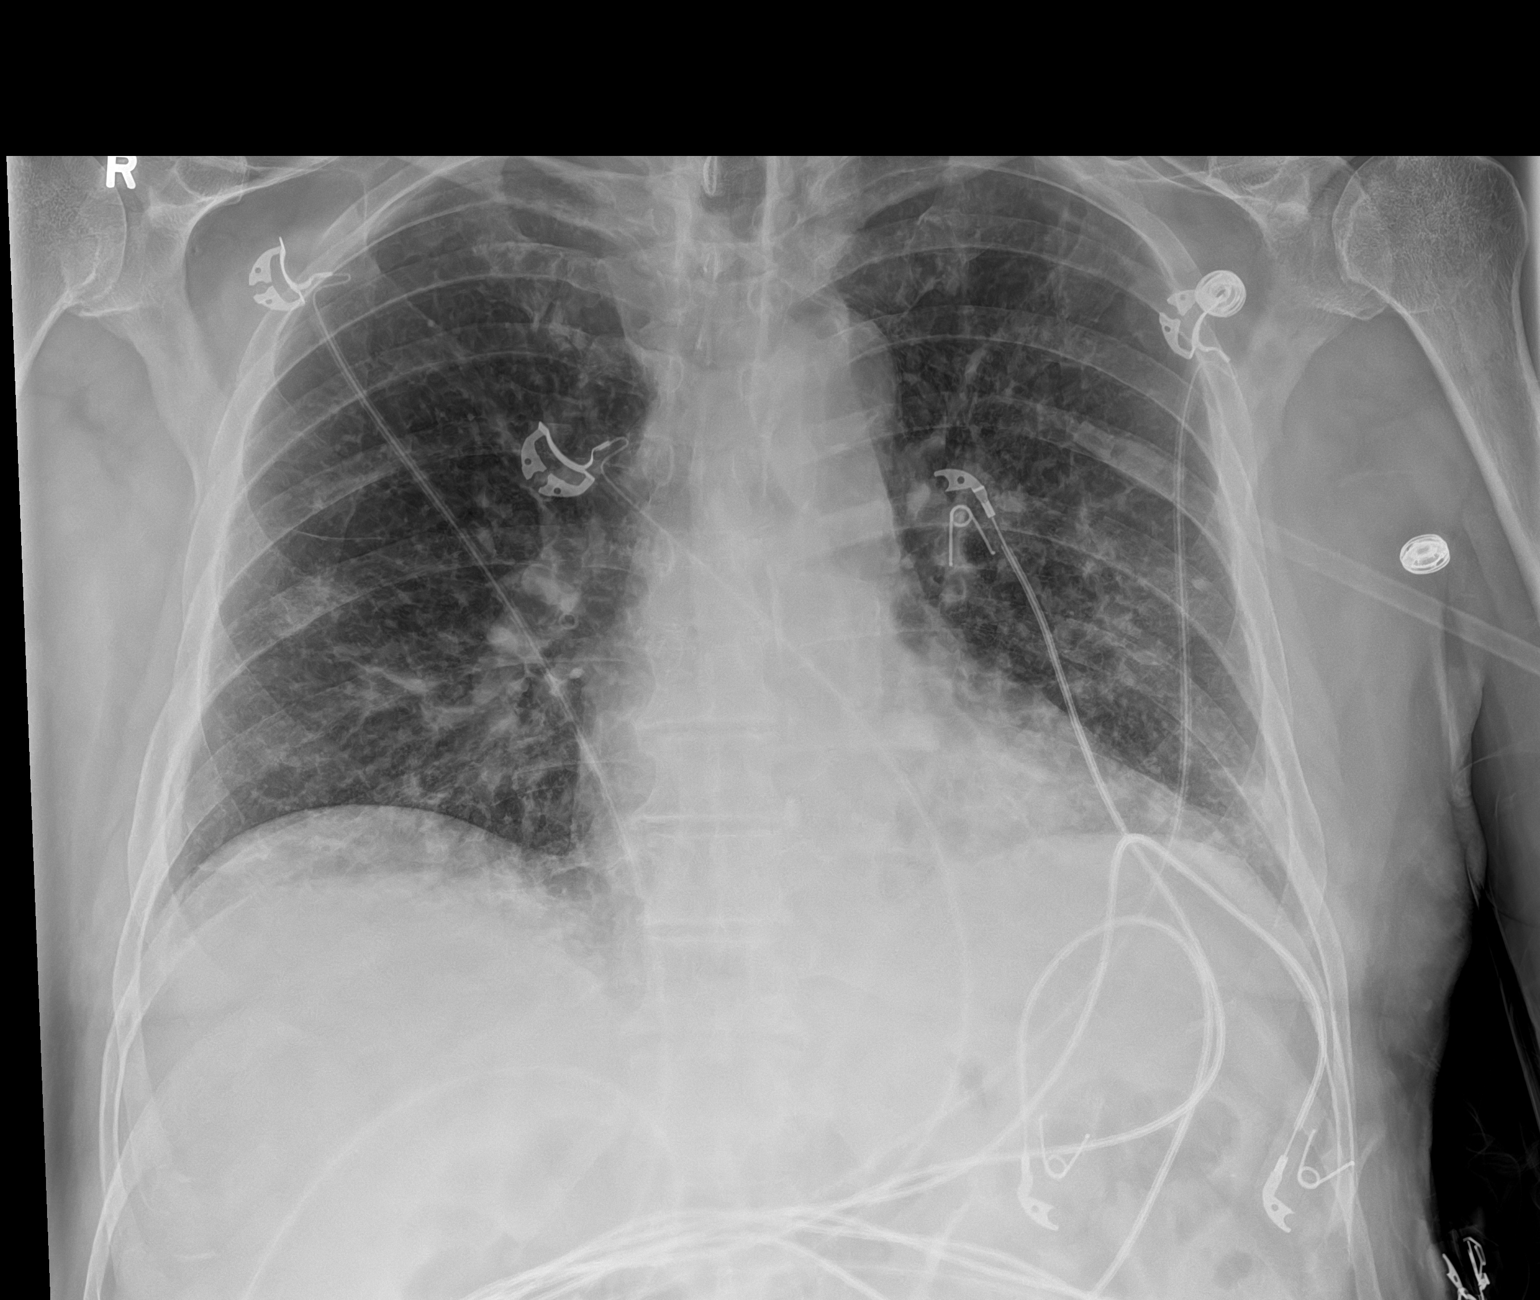

[1 of 1 positions shown; findings below may reference images not displayed]

FINDINGS: The heart size and mediastinal contours are within normal limits. No
pneumothorax or pleural effusion is noted. Multiple airspace
opacities are noted in both lung bases concerning for multifocal
pneumonia. The visualized skeletal structures are unremarkable.
IMPRESSION: Multiple bibasilar airspace opacities are noted concerning for
multifocal pneumonia.

## 2021-09-20 DIAGNOSIS — Z20822 Contact with and (suspected) exposure to covid-19: Secondary | ICD-10-CM | POA: Diagnosis not present

## 2021-09-22 DIAGNOSIS — J189 Pneumonia, unspecified organism: Secondary | ICD-10-CM | POA: Diagnosis not present

## 2021-09-22 DIAGNOSIS — E119 Type 2 diabetes mellitus without complications: Secondary | ICD-10-CM | POA: Diagnosis not present

## 2021-09-22 DIAGNOSIS — I10 Essential (primary) hypertension: Secondary | ICD-10-CM | POA: Diagnosis not present

## 2021-09-22 DIAGNOSIS — Z794 Long term (current) use of insulin: Secondary | ICD-10-CM | POA: Diagnosis not present

## 2021-09-22 DIAGNOSIS — Z792 Long term (current) use of antibiotics: Secondary | ICD-10-CM | POA: Diagnosis not present

## 2021-09-22 DIAGNOSIS — Z7984 Long term (current) use of oral hypoglycemic drugs: Secondary | ICD-10-CM | POA: Diagnosis not present

## 2021-10-04 DIAGNOSIS — J189 Pneumonia, unspecified organism: Secondary | ICD-10-CM | POA: Diagnosis not present

## 2021-10-04 DIAGNOSIS — Z7984 Long term (current) use of oral hypoglycemic drugs: Secondary | ICD-10-CM | POA: Diagnosis not present

## 2021-10-04 DIAGNOSIS — Z794 Long term (current) use of insulin: Secondary | ICD-10-CM | POA: Diagnosis not present

## 2021-10-04 DIAGNOSIS — I10 Essential (primary) hypertension: Secondary | ICD-10-CM | POA: Diagnosis not present

## 2021-10-04 DIAGNOSIS — E119 Type 2 diabetes mellitus without complications: Secondary | ICD-10-CM | POA: Diagnosis not present

## 2021-10-04 DIAGNOSIS — Z792 Long term (current) use of antibiotics: Secondary | ICD-10-CM | POA: Diagnosis not present

## 2021-10-06 DIAGNOSIS — Z299 Encounter for prophylactic measures, unspecified: Secondary | ICD-10-CM | POA: Diagnosis not present

## 2021-10-06 DIAGNOSIS — I10 Essential (primary) hypertension: Secondary | ICD-10-CM | POA: Diagnosis not present

## 2021-10-06 DIAGNOSIS — Z6823 Body mass index (BMI) 23.0-23.9, adult: Secondary | ICD-10-CM | POA: Diagnosis not present

## 2021-10-06 DIAGNOSIS — R519 Headache, unspecified: Secondary | ICD-10-CM | POA: Diagnosis not present

## 2021-10-06 DIAGNOSIS — Z87891 Personal history of nicotine dependence: Secondary | ICD-10-CM | POA: Diagnosis not present

## 2021-10-11 ENCOUNTER — Other Ambulatory Visit: Payer: Self-pay | Admitting: Cardiology

## 2021-10-11 ENCOUNTER — Ambulatory Visit (INDEPENDENT_AMBULATORY_CARE_PROVIDER_SITE_OTHER): Payer: Medicare Other | Admitting: Pulmonary Disease

## 2021-10-11 ENCOUNTER — Encounter: Payer: Self-pay | Admitting: Pulmonary Disease

## 2021-10-11 ENCOUNTER — Ambulatory Visit (INDEPENDENT_AMBULATORY_CARE_PROVIDER_SITE_OTHER): Payer: Medicare Other

## 2021-10-11 VITALS — BP 130/72 | HR 64 | Temp 98.2°F | Ht 65.0 in | Wt 142.2 lb

## 2021-10-11 DIAGNOSIS — R0602 Shortness of breath: Secondary | ICD-10-CM

## 2021-10-11 DIAGNOSIS — J189 Pneumonia, unspecified organism: Secondary | ICD-10-CM | POA: Diagnosis not present

## 2021-10-11 MED ORDER — TRELEGY ELLIPTA 100-62.5-25 MCG/ACT IN AEPB: 1.0000 | INHALATION_SPRAY | Freq: Every day | RESPIRATORY_TRACT | 0 refills | Status: DC

## 2021-10-11 NOTE — Patient Instructions (Signed)
Continue trelegy ?We will get a chest x-ray today ?Follow-up in 6 months ?

## 2021-10-11 NOTE — Progress Notes (Signed)
? ?      ?Travis Beck    [id number]    07/27/1945 ? ?Primary Care Physician:Shah, Weldon Picking, MD ? ?Referring Physician: Monico Blitz, MD ?[address] ?[place],  [place] [address] ? ?Chief complaint: Follow up for asthma, emphysema, chronic bronchitis, abnormal CT scan ? ?HPI: ?76 year old with history of allergic rhinitis, hyperlipidemia, type 2 diabetes, former smoker ?Treated for pneumonia in May-June 2019.  He required 3 rounds of antibiotic to finally get symptoms under control.  He had imaging including CT of the chest which showed bilateral infiltrates, nodular opacities and has been referred here for further evaluation.  Follow-up CT shows resolution of infiltrate. ? ?Evaluated by Dr. Ernst Bowler, allergy ?Also follows with Dr. Melony Overly for esophagitis, benign stricture and Schatzki's ring ? ?Pets: Has a dog, no cats, birds, farm animals ?Occupation: Used to work from [employer].  Currently retired ?Exposures: No known exposures, no leak, hot tub, Jacuzzi, mold ?Smoking history: 35-pack-year smoker.  Quit in 1994 ?Travel history: No significant travel. ? ?Interim history: ?He was recently hospitalized at [facility] for community-acquired pneumonia.  Treated with antibiotics. ? ?Feeling well overall since discharge. States he needs some samples of Trelegy.  ? ?Outpatient Encounter Medications as of 10/11/2021  ?Medication Sig  ? acetaminophen (TYLENOL) 325 MG tablet Take 2 tablets (650 mg total) by mouth every 6 (six) hours as needed for mild pain (or Fever >/= 101).  ? albuterol (VENTOLIN HFA) 108 (90 Base) MCG/ACT inhaler Inhale 2 puffs into the lungs every 4 (four) hours as needed for wheezing or shortness of breath.  ? ALPRAZolam (XANAX) 1 MG tablet Take 1 tablet (1 mg total) by mouth 3 (three) times daily as needed for anxiety.  ? amLODipine (NORVASC) 2.5 MG tablet Take 1 tablet (2.5 mg total) by mouth daily. For BP  ? aspirin EC 81 MG tablet Take 1 tablet (81 mg total) by mouth daily. Swallow whole.   ? buprenorphine (SUBUTEX) 8 MG SUBL SL tablet Place 8 mg under the tongue daily.  ? cetirizine (ZYRTEC) 10 MG tablet Take 10 mg by mouth as needed for allergies (itching).  ? fluticasone (FLONASE) 50 MCG/ACT nasal spray Place 2 sprays into both nostrils daily.   ? gabapentin (NEURONTIN) 400 MG capsule Take 1 capsule (400 mg total) by mouth every 8 (eight) hours as needed.  ? glyBURIDE (DIABETA) 5 MG tablet Take 10 mg by mouth 2 (two) times daily with a meal.   ? guaiFENesin (MUCINEX) 600 MG 12 hr tablet Take 1 tablet (600 mg total) by mouth 2 (two) times daily.  ? ipratropium-albuterol (DUONEB) 0.5-2.5 (3) MG/3ML SOLN Inhale 3 mLs into the lungs every 6 (six) hours as needed.  ? levocetirizine (XYZAL) 5 MG tablet Take 1 tablet by mouth. occasionally  ? metFORMIN (GLUCOPHAGE) 1000 MG tablet Take 1,000 mg by mouth 2 (two) times daily with a meal.   ? pantoprazole (PROTONIX) 40 MG tablet Take 1 tablet (40 mg total) by mouth daily.  ? polyethylene glycol (MIRALAX / GLYCOLAX) 17 g packet Take 17 g by mouth 2 (two) times daily.  ? rosuvastatin (CRESTOR) 20 MG tablet TAKE ONE TABLET BY MOUTH ONCE DAILY **PATIENT NEEDS APPOINTMENT FOR FUTURE REFILLS**  ? TRELEGY ELLIPTA 100-62.5-25 MCG/INH AEPB inhale ONE PUFF into THE lungs DAILY  ? triamcinolone cream (KENALOG) 0.1 % Apply 1 application topically daily as needed.  ? valsartan (DIOVAN) 40 MG tablet Take 1 tablet (40 mg total) by mouth daily. For Heart and blood pressure  ? [  DISCONTINUED] Fluticasone-Umeclidin-Vilant (TRELEGY ELLIPTA) 100-62.5-25 MCG/ACT AEPB INHALE ONE PUFF BY MOUTH DAILY  ? [DISCONTINUED] Fluticasone-Umeclidin-Vilant (TRELEGY ELLIPTA) 100-62.5-25 MCG/INH AEPB Inhale 1 puff into the lungs daily.  ? ?No facility-administered encounter medications on file as of 10/11/2021.  ? ?Physical Exam: ?Blood pressure 118/66, pulse 76, temperature 98.5 ?F (36.9 ?C), temperature source Temporal, height '5\' 5"'$  (1.651 m), weight 151 lb (68.5 kg), SpO2 95 %. ?Gen:       Well-appearing. No acute distress ?HEENT:  EOMI, sclera anicteric ?Neck:     No masses; no thyromegaly ?Lungs:    Clear to auscultation bilaterally; normal respiratory effort. Mild expiratory wheezes. No rales.  ?CV:         Regular rate and rhythm; no murmurs ?Abd:      + bowel sounds; soft, non-tender; no palpable masses, no distension ?Ext:    No edema; adequate peripheral perfusion ?Skin:      Warm and dry; no rash ?Neuro: alert and oriented x 3 ?Psych: normal mood and affect ? ?Data Reviewed: ?Imaging ?CT chest 12/21/2017- bilateral peribronchovascular nodularity, groundglass opacities left greater than right.  Dominant 1.1 cm left upper lobe nodule.  Mild centrilobular, paraseptal emphysema.  Left main and three-vessel coronary atherosclerosis.  Small hiatal hernia.   ? ?CT chest 04/25/2018- improvement in left upper lobe patchy airspace opacities, mild tree-in-bud in the upper lobes and lower lobes.  Peripheral subcentimeter pulmonary nodules.,  Aortic atherosclerosis ? ?CT scan 04/22/2019-scattered subcentimeter nodules, tree-in-bud opacities.  Multiple patchy areas of groundglass in the upper lobe.  ? ?HRCT 09/23/20-no evidence of interstitial lung disease ? ?Chest x-ray 09/05/2021-left upper lobe dense consolidation, right lower lobe nodular opacity.  I have reviewed the images personally. ? ?PFTs ?04/17/2018 ?FVC 3.20 [92%), FEV1 2.45 [98%), F/F 77, TLC 125%, RV/DL 361%, DLCO 69% ?No obstruction, air trapping ?Mild reduction diffusion capacity. ? ?04/05/2021 ?FVC 3.02 [90%], FEV1 2.32 [97%], F/F 77, TLC 4.57 [75%] DLCO 17.6 [83%] ?Normal test ? ?ACT score 08/31/20- 17 ? ?Labs: ?CBC 05/11/2020-WBC 7.6, eos 8.1%, absolute eosinophil count 616 ?IgE 06/05/2020-160 ? ?Respiratory allergy profile-IgE 117, RAST panel- Negative ?Alpha-1 antitrypsin 02/18/2019-172, PI MM ?Mold profile 03/06/2019-negative ? ?Sputum cultures 11/6-20-negative ? ?Procedures: ?EGD 05/19/2019- ?- Scar in the distal esophagus. Healed  esophagitis. ?- Benign-appearing esophageal stenosis proximal to GEJ. Dilated. ?- Non-obstructing Schatzki ring. ?- 3 cm hiatal hernia. ?- Normal stomach. ?- Normal duodenal bulb and second portion of the duodenum. ?  ?Assessment:  ?Recent admission for community-acquired pneumonia ?I have reviewed his chest x-ray from [facility] shows bilateral opacities.  He is doing better since discharge ?Get chest x-ray today for follow-up to ensure resolution ? ?Asthma, emphysema, chronic bronchitis with exacerbation ?PFTs have not shown any significant obstruction but he does have emphysema and air trapping. Repeat PFT today shows normal spirometry but mildly reduced TLC.  ? ?Has a history of peripheral eosinophils.  There was concern for eosinophilic pneumonia but recent HRCT showed some postinfectious scarring but no evidence of ILD.  ? ?Is doing well on Trelegy inhaler and will continue the same ? ?Chronic rhinitis, allergies ?Stable. Follows with Dr. Ernst Bowler ? ?GERD, esophagitis, esophageal stricture ?Stable. Follows with GI and is on PPI therapy. ? ?Health maintenance ?Up-to-date with Pneumovax, flu ?Has been vaccinated against Covid. Received the booster but had a reaction to it.  ? ?Plan/Recommendations: ?- Chest x-ray ?- Continue Trelegy, albuterol as needed ?- Give sample of Trelegy ?- Follow up in 6 months ? ?Marshell Garfinkel MD ?[facility] ?10/11/2021, 8:51 AM ? ?CC: Monico Blitz, MD ? ? ?

## 2021-10-11 NOTE — Addendum Note (Signed)
Addended by: Elton Sin on: 10/11/2021 10:23 AM ? ? Modules accepted: Orders ? ?

## 2021-10-13 DIAGNOSIS — E119 Type 2 diabetes mellitus without complications: Secondary | ICD-10-CM | POA: Diagnosis not present

## 2021-10-13 DIAGNOSIS — J189 Pneumonia, unspecified organism: Secondary | ICD-10-CM | POA: Diagnosis not present

## 2021-10-13 DIAGNOSIS — Z7984 Long term (current) use of oral hypoglycemic drugs: Secondary | ICD-10-CM | POA: Diagnosis not present

## 2021-10-13 DIAGNOSIS — Z20822 Contact with and (suspected) exposure to covid-19: Secondary | ICD-10-CM | POA: Diagnosis not present

## 2021-10-13 DIAGNOSIS — Z792 Long term (current) use of antibiotics: Secondary | ICD-10-CM | POA: Diagnosis not present

## 2021-10-13 DIAGNOSIS — I10 Essential (primary) hypertension: Secondary | ICD-10-CM | POA: Diagnosis not present

## 2021-10-13 DIAGNOSIS — Z794 Long term (current) use of insulin: Secondary | ICD-10-CM | POA: Diagnosis not present

## 2021-10-17 DIAGNOSIS — Z20822 Contact with and (suspected) exposure to covid-19: Secondary | ICD-10-CM | POA: Diagnosis not present

## 2021-10-18 DIAGNOSIS — L570 Actinic keratosis: Secondary | ICD-10-CM | POA: Diagnosis not present

## 2021-10-20 DIAGNOSIS — M25569 Pain in unspecified knee: Secondary | ICD-10-CM | POA: Diagnosis not present

## 2021-10-20 DIAGNOSIS — Z79891 Long term (current) use of opiate analgesic: Secondary | ICD-10-CM | POA: Diagnosis not present

## 2021-10-20 DIAGNOSIS — M545 Low back pain, unspecified: Secondary | ICD-10-CM | POA: Diagnosis not present

## 2021-10-20 DIAGNOSIS — M130 Polyarthritis, unspecified: Secondary | ICD-10-CM | POA: Diagnosis not present

## 2021-10-20 DIAGNOSIS — M542 Cervicalgia: Secondary | ICD-10-CM | POA: Diagnosis not present

## 2021-10-25 ENCOUNTER — Telehealth: Payer: Self-pay | Admitting: Pulmonary Disease

## 2021-10-25 NOTE — Telephone Encounter (Signed)
I called the patient and she is inquiring about the CXR. Please advise.  ? ?She is ok with leaving a message on the machine if we do not get her on the phone.  ?

## 2021-10-26 NOTE — Telephone Encounter (Signed)
Chest x ray shows improving pneumonia which is good news ?

## 2021-10-26 NOTE — Telephone Encounter (Signed)
ATC patient's sister Lelan Pons [facility]).  Left detailed message on VM with Dr. Matilde Bash recommendations and to call back if there are any further questions.  Nothing further at this time. ?

## 2021-11-09 DIAGNOSIS — I10 Essential (primary) hypertension: Secondary | ICD-10-CM | POA: Diagnosis not present

## 2021-11-09 DIAGNOSIS — J189 Pneumonia, unspecified organism: Secondary | ICD-10-CM | POA: Diagnosis not present

## 2021-11-09 DIAGNOSIS — E119 Type 2 diabetes mellitus without complications: Secondary | ICD-10-CM | POA: Diagnosis not present

## 2021-11-09 DIAGNOSIS — Z7984 Long term (current) use of oral hypoglycemic drugs: Secondary | ICD-10-CM | POA: Diagnosis not present

## 2021-11-09 DIAGNOSIS — Z794 Long term (current) use of insulin: Secondary | ICD-10-CM | POA: Diagnosis not present

## 2021-11-09 DIAGNOSIS — Z20822 Contact with and (suspected) exposure to covid-19: Secondary | ICD-10-CM | POA: Diagnosis not present

## 2021-11-09 DIAGNOSIS — Z792 Long term (current) use of antibiotics: Secondary | ICD-10-CM | POA: Diagnosis not present

## 2021-11-10 DIAGNOSIS — E1140 Type 2 diabetes mellitus with diabetic neuropathy, unspecified: Secondary | ICD-10-CM | POA: Diagnosis not present

## 2021-11-10 DIAGNOSIS — B351 Tinea unguium: Secondary | ICD-10-CM | POA: Diagnosis not present

## 2021-11-11 DIAGNOSIS — Z20822 Contact with and (suspected) exposure to covid-19: Secondary | ICD-10-CM | POA: Diagnosis not present

## 2021-11-12 DIAGNOSIS — I10 Essential (primary) hypertension: Secondary | ICD-10-CM | POA: Diagnosis not present

## 2021-11-12 DIAGNOSIS — E119 Type 2 diabetes mellitus without complications: Secondary | ICD-10-CM | POA: Diagnosis not present

## 2021-11-12 DIAGNOSIS — Z7984 Long term (current) use of oral hypoglycemic drugs: Secondary | ICD-10-CM | POA: Diagnosis not present

## 2021-11-12 DIAGNOSIS — Z8701 Personal history of pneumonia (recurrent): Secondary | ICD-10-CM | POA: Diagnosis not present

## 2021-11-12 DIAGNOSIS — Z7951 Long term (current) use of inhaled steroids: Secondary | ICD-10-CM | POA: Diagnosis not present

## 2021-11-13 DIAGNOSIS — Z20822 Contact with and (suspected) exposure to covid-19: Secondary | ICD-10-CM | POA: Diagnosis not present

## 2021-11-15 DIAGNOSIS — E119 Type 2 diabetes mellitus without complications: Secondary | ICD-10-CM | POA: Diagnosis not present

## 2021-11-15 DIAGNOSIS — Z7951 Long term (current) use of inhaled steroids: Secondary | ICD-10-CM | POA: Diagnosis not present

## 2021-11-15 DIAGNOSIS — Z8701 Personal history of pneumonia (recurrent): Secondary | ICD-10-CM | POA: Diagnosis not present

## 2021-11-15 DIAGNOSIS — Z7984 Long term (current) use of oral hypoglycemic drugs: Secondary | ICD-10-CM | POA: Diagnosis not present

## 2021-11-15 DIAGNOSIS — I10 Essential (primary) hypertension: Secondary | ICD-10-CM | POA: Diagnosis not present

## 2021-11-18 DIAGNOSIS — Z7984 Long term (current) use of oral hypoglycemic drugs: Secondary | ICD-10-CM | POA: Diagnosis not present

## 2021-11-18 DIAGNOSIS — Z7951 Long term (current) use of inhaled steroids: Secondary | ICD-10-CM | POA: Diagnosis not present

## 2021-11-18 DIAGNOSIS — E119 Type 2 diabetes mellitus without complications: Secondary | ICD-10-CM | POA: Diagnosis not present

## 2021-11-18 DIAGNOSIS — Z8701 Personal history of pneumonia (recurrent): Secondary | ICD-10-CM | POA: Diagnosis not present

## 2021-11-18 DIAGNOSIS — I10 Essential (primary) hypertension: Secondary | ICD-10-CM | POA: Diagnosis not present

## 2021-11-29 DIAGNOSIS — E1165 Type 2 diabetes mellitus with hyperglycemia: Secondary | ICD-10-CM | POA: Diagnosis not present

## 2021-11-29 DIAGNOSIS — M791 Myalgia, unspecified site: Secondary | ICD-10-CM | POA: Diagnosis not present

## 2021-11-29 DIAGNOSIS — Z6825 Body mass index (BMI) 25.0-25.9, adult: Secondary | ICD-10-CM | POA: Diagnosis not present

## 2021-11-29 DIAGNOSIS — I10 Essential (primary) hypertension: Secondary | ICD-10-CM | POA: Diagnosis not present

## 2021-11-29 DIAGNOSIS — Z299 Encounter for prophylactic measures, unspecified: Secondary | ICD-10-CM | POA: Diagnosis not present

## 2021-11-30 DIAGNOSIS — E119 Type 2 diabetes mellitus without complications: Secondary | ICD-10-CM | POA: Diagnosis not present

## 2021-11-30 DIAGNOSIS — I10 Essential (primary) hypertension: Secondary | ICD-10-CM | POA: Diagnosis not present

## 2021-11-30 DIAGNOSIS — Z7984 Long term (current) use of oral hypoglycemic drugs: Secondary | ICD-10-CM | POA: Diagnosis not present

## 2021-11-30 DIAGNOSIS — Z7951 Long term (current) use of inhaled steroids: Secondary | ICD-10-CM | POA: Diagnosis not present

## 2021-11-30 DIAGNOSIS — Z8701 Personal history of pneumonia (recurrent): Secondary | ICD-10-CM | POA: Diagnosis not present

## 2021-12-06 ENCOUNTER — Other Ambulatory Visit (INDEPENDENT_AMBULATORY_CARE_PROVIDER_SITE_OTHER): Payer: Self-pay | Admitting: Gastroenterology

## 2021-12-26 DIAGNOSIS — F329 Major depressive disorder, single episode, unspecified: Secondary | ICD-10-CM | POA: Diagnosis not present

## 2021-12-30 DIAGNOSIS — J439 Emphysema, unspecified: Secondary | ICD-10-CM | POA: Diagnosis not present

## 2021-12-30 DIAGNOSIS — I10 Essential (primary) hypertension: Secondary | ICD-10-CM | POA: Diagnosis not present

## 2021-12-30 DIAGNOSIS — Z6824 Body mass index (BMI) 24.0-24.9, adult: Secondary | ICD-10-CM | POA: Diagnosis not present

## 2021-12-30 DIAGNOSIS — E1165 Type 2 diabetes mellitus with hyperglycemia: Secondary | ICD-10-CM | POA: Diagnosis not present

## 2021-12-30 DIAGNOSIS — Z299 Encounter for prophylactic measures, unspecified: Secondary | ICD-10-CM | POA: Diagnosis not present

## 2022-01-12 DIAGNOSIS — Z79891 Long term (current) use of opiate analgesic: Secondary | ICD-10-CM | POA: Diagnosis not present

## 2022-01-12 DIAGNOSIS — M542 Cervicalgia: Secondary | ICD-10-CM | POA: Diagnosis not present

## 2022-01-12 DIAGNOSIS — M25569 Pain in unspecified knee: Secondary | ICD-10-CM | POA: Diagnosis not present

## 2022-01-12 DIAGNOSIS — M130 Polyarthritis, unspecified: Secondary | ICD-10-CM | POA: Diagnosis not present

## 2022-01-12 DIAGNOSIS — M545 Low back pain, unspecified: Secondary | ICD-10-CM | POA: Diagnosis not present

## 2022-01-13 DIAGNOSIS — R011 Cardiac murmur, unspecified: Secondary | ICD-10-CM | POA: Diagnosis not present

## 2022-01-13 DIAGNOSIS — Z299 Encounter for prophylactic measures, unspecified: Secondary | ICD-10-CM | POA: Diagnosis not present

## 2022-01-13 DIAGNOSIS — J439 Emphysema, unspecified: Secondary | ICD-10-CM | POA: Diagnosis not present

## 2022-01-13 DIAGNOSIS — Z6825 Body mass index (BMI) 25.0-25.9, adult: Secondary | ICD-10-CM | POA: Diagnosis not present

## 2022-01-13 DIAGNOSIS — E1165 Type 2 diabetes mellitus with hyperglycemia: Secondary | ICD-10-CM | POA: Diagnosis not present

## 2022-01-13 DIAGNOSIS — I10 Essential (primary) hypertension: Secondary | ICD-10-CM | POA: Diagnosis not present

## 2022-01-16 DIAGNOSIS — R010 Benign and innocent cardiac murmurs: Secondary | ICD-10-CM | POA: Diagnosis not present

## 2022-03-15 DIAGNOSIS — Z299 Encounter for prophylactic measures, unspecified: Secondary | ICD-10-CM | POA: Diagnosis not present

## 2022-03-15 DIAGNOSIS — Z713 Dietary counseling and surveillance: Secondary | ICD-10-CM | POA: Diagnosis not present

## 2022-03-15 DIAGNOSIS — Z23 Encounter for immunization: Secondary | ICD-10-CM | POA: Diagnosis not present

## 2022-03-15 DIAGNOSIS — I10 Essential (primary) hypertension: Secondary | ICD-10-CM | POA: Diagnosis not present

## 2022-03-15 DIAGNOSIS — E1165 Type 2 diabetes mellitus with hyperglycemia: Secondary | ICD-10-CM | POA: Diagnosis not present

## 2022-03-15 DIAGNOSIS — Z6824 Body mass index (BMI) 24.0-24.9, adult: Secondary | ICD-10-CM | POA: Diagnosis not present

## 2022-03-27 DIAGNOSIS — Z6825 Body mass index (BMI) 25.0-25.9, adult: Secondary | ICD-10-CM | POA: Diagnosis not present

## 2022-03-27 DIAGNOSIS — J22 Unspecified acute lower respiratory infection: Secondary | ICD-10-CM | POA: Diagnosis not present

## 2022-03-27 DIAGNOSIS — J441 Chronic obstructive pulmonary disease with (acute) exacerbation: Secondary | ICD-10-CM | POA: Diagnosis not present

## 2022-03-27 DIAGNOSIS — Z87891 Personal history of nicotine dependence: Secondary | ICD-10-CM | POA: Diagnosis not present

## 2022-03-27 DIAGNOSIS — Z299 Encounter for prophylactic measures, unspecified: Secondary | ICD-10-CM | POA: Diagnosis not present

## 2022-03-30 ENCOUNTER — Ambulatory Visit: Payer: Medicare Other | Admitting: Pulmonary Disease

## 2022-04-03 ENCOUNTER — Ambulatory Visit (INDEPENDENT_AMBULATORY_CARE_PROVIDER_SITE_OTHER): Payer: Medicare Other

## 2022-04-03 ENCOUNTER — Ambulatory Visit (INDEPENDENT_AMBULATORY_CARE_PROVIDER_SITE_OTHER): Payer: Medicare Other | Admitting: Nurse Practitioner

## 2022-04-03 ENCOUNTER — Telehealth: Payer: Self-pay | Admitting: Nurse Practitioner

## 2022-04-03 VITALS — BP 136/80 | HR 63 | Temp 98.1°F | Ht 65.0 in | Wt 145.0 lb

## 2022-04-03 DIAGNOSIS — J45901 Unspecified asthma with (acute) exacerbation: Secondary | ICD-10-CM | POA: Diagnosis not present

## 2022-04-03 DIAGNOSIS — J441 Chronic obstructive pulmonary disease with (acute) exacerbation: Secondary | ICD-10-CM

## 2022-04-03 DIAGNOSIS — K2100 Gastro-esophageal reflux disease with esophagitis, without bleeding: Secondary | ICD-10-CM | POA: Diagnosis not present

## 2022-04-03 DIAGNOSIS — J189 Pneumonia, unspecified organism: Secondary | ICD-10-CM

## 2022-04-03 DIAGNOSIS — R059 Cough, unspecified: Secondary | ICD-10-CM | POA: Diagnosis not present

## 2022-04-03 MED ORDER — DOXYCYCLINE HYCLATE 100 MG PO TABS: 100.0000 mg | ORAL_TABLET | Freq: Two times a day (BID) | ORAL | 0 refills | Status: DC

## 2022-04-03 MED ORDER — PREDNISONE 10 MG PO TABS: ORAL_TABLET | ORAL | 0 refills | Status: DC

## 2022-04-03 MED ORDER — TRELEGY ELLIPTA 100-62.5-25 MCG/ACT IN AEPB: 1.0000 | INHALATION_SPRAY | Freq: Every day | RESPIRATORY_TRACT | 0 refills | Status: DC

## 2022-04-03 MED ORDER — TRELEGY ELLIPTA 100-62.5-25 MCG/ACT IN AEPB: 1.0000 | INHALATION_SPRAY | Freq: Every day | RESPIRATORY_TRACT | 4 refills | Status: DC

## 2022-04-03 MED ORDER — AMOXICILLIN-POT CLAVULANATE 875-125 MG PO TABS
1.0000 | ORAL_TABLET | Freq: Two times a day (BID) | ORAL | 0 refills | Status: AC
Start: 2022-04-03 — End: 2022-04-13

## 2022-04-03 NOTE — Telephone Encounter (Signed)
Was able to contact [facility] and notified them. They will be able to fill up the prescription.

## 2022-04-03 NOTE — Assessment & Plan Note (Addendum)
Breakthrough symptoms; worse at night. Advised to avoid eating 2-3 hours before bed. Elevate HOB at night. Continue PPI and add on pepcid at bedtime.

## 2022-04-03 NOTE — Progress Notes (Unsigned)
04/03/2022: CXR showed multifocal pneumonia throughout the right lung and left lung base. Discussed abx options. He is on Glyburide so levaquin would be unsafe due to increased risk for hypoglycemia. He does have pcn listed as an allergy; this was a mild rash and he did not have any anaphylactic response. He has tolerated ampicillin during past hospitalization without any difficulties. He was agreeable to augmentin course. Recommended we treat him for 10 days given extent. Advised that if he develops a rash, to stop and notify immediately. If he were to develop increased shortness of breath, difficulties swallowing, tongue or throat itching/swelling, go to ED or call 911. They verbalized understanding.

## 2022-04-03 NOTE — Progress Notes (Unsigned)
$'@Patient'K$  ID: Travis Beck, male    DOB: 1946-01-17, 76 y.o.   MRN: [id number]  Chief Complaint  Patient presents with   Follow-up    Referring provider: Monico Blitz, MD  HPI: 76 year old male, former smoker followed for asthma and emphysema. He is a patient of Dr. Matilde Bash and last seen in office 10/11/2021. Past medical history significant for GERD, DM, allergic rhinitis, HTN.  TEST/EVENTS:  09/23/2020 HRCT: There is biapical pleural-parenchymal scarring.  No evidence of ILD.  There is some mild residual peribronchovascular nodularity and mild bronchiectasis, postinfectious in etiology.  Calcified granulomas.  Limited evaluation for air trapping 04/05/2021 PFT: FVC 91, FEV1 92, ratio 77, DLCOcor 83.  No significant BD; did have some mid flow reversibility  10/11/2021: OV with Dr. Vaughan Browner.  Recently hospitalized at [facility] for pneumonia in February 2023.  Treated with antibiotics.  Overall feeling well since discharge.  Needs some samples of Trelegy.  Previous history of elevated eosinophils.  There was some concern for eosinophilic pneumonia but HRCT showed some postinfectious scarring and no evidence of ILD.  He is on Trelegy and doing well.  Follows with Dr. Ernst Bowler for allergic rhinitis.  GERD stable and on PPI therapy.  CXR showed improving pneumonia.  Follow-up 6 months.  04/03/2022: Today-acute Patient presents today with his sister for acute visit.  He has been out of his Trelegy for the last 2 weeks.  He used to receive it in the mail from [place] but reports that it has stopped coming. He doesn't remember receiving notice of it stopping. He has been having increased shortness of breath and cough.  He is a lot of chest congestion and is coughing up yellow sputum. Feels like his appetite has not been the greatest.  He also feels like his energy levels are low.  Denies any fevers, chills, hemoptysis, lower extremity swelling, wheezing.  He was at church helping with [employer]  around 2 weeks ago as well.  Noticed that he had some minor cold-like symptoms a few days after but these went away.  He did not swab for COVID or the flu.  He is using his albuterol rescue almost every day. His PCP did treat him with z pack, which he finished a week ago and didn't notice a difference.  He also has been having trouble with indigestion at night. He takes protonix in the morning and doesn't have much trouble during the day unless he eats something that triggers his reflux. He feels like at night it gets worse and he has to take tums. Denies any difficulties swallowing, hoarse voice, changes in bowel habits or weight loss.   Allergies  Allergen Reactions   Penicillins Rash    No problems with ampicillin during hospitalization    Immunization History  Administered Date(s) Administered   Fluad Quad(high Dose 65+) 05/06/2020   Influenza, High Dose Seasonal PF 04/09/2017, 04/22/2018, 04/10/2019, 05/02/2019, 04/04/2021   Moderna Sars-Covid-2 Vaccination 09/24/2019, 10/20/2019, 06/22/2020   Tdap 02/04/2018    Past Medical History:  Diagnosis Date   Arthritis    Asthma    Childhood   Emphysema lung ([place])    Essential hypertension    Ischemic heart disease    Myoview 2020 indicating inferior infarct scar with mild peri-infarct ischemia - managed medically   Type 2 diabetes mellitus ([place])     Tobacco History: Social History   Tobacco Use  Smoking Status Former   Packs/day: 2.00   Years: 33.00   Total  pack years: 66.00   Types: Cigarettes   Quit date: 01/22/1993   Years since quitting: 29.2  Smokeless Tobacco Never   Counseling given: Not Answered   Outpatient Medications Prior to Visit  Medication Sig Dispense Refill   acetaminophen (TYLENOL) 325 MG tablet Take 2 tablets (650 mg total) by mouth every 6 (six) hours as needed for mild pain (or Fever >/= 101). 12 tablet 0   albuterol (VENTOLIN HFA) 108 (90 Base) MCG/ACT inhaler Inhale 2 puffs into the lungs every 4  (four) hours as needed for wheezing or shortness of breath. 18 g 0   ALPRAZolam (XANAX) 1 MG tablet Take 1 tablet (1 mg total) by mouth 3 (three) times daily as needed for anxiety. 30 tablet 0   amLODipine (NORVASC) 2.5 MG tablet Take 1 tablet (2.5 mg total) by mouth daily. For BP 30 tablet 5   aspirin EC 81 MG tablet Take 1 tablet (81 mg total) by mouth daily. Swallow whole. 90 tablet 3   buprenorphine (SUBUTEX) 8 MG SUBL SL tablet Place 8 mg under the tongue daily.     cetirizine (ZYRTEC) 10 MG tablet Take 10 mg by mouth as needed for allergies (itching).     fluticasone (FLONASE) 50 MCG/ACT nasal spray Place 2 sprays into both nostrils daily.      gabapentin (NEURONTIN) 400 MG capsule Take 1 capsule (400 mg total) by mouth every 8 (eight) hours as needed.  1   glyBURIDE (DIABETA) 5 MG tablet Take 10 mg by mouth 2 (two) times daily with a meal.      guaiFENesin (MUCINEX) 600 MG 12 hr tablet Take 1 tablet (600 mg total) by mouth 2 (two) times daily. 20 tablet 2   ipratropium-albuterol (DUONEB) 0.5-2.5 (3) MG/3ML SOLN Inhale 3 mLs into the lungs every 6 (six) hours as needed.     levocetirizine (XYZAL) 5 MG tablet Take 1 tablet by mouth. occasionally     metFORMIN (GLUCOPHAGE) 1000 MG tablet Take 1,000 mg by mouth 2 (two) times daily with a meal.      pantoprazole (PROTONIX) 40 MG tablet Take 1 tablet (40 mg total) by mouth daily. 90 tablet 3   polyethylene glycol (MIRALAX / GLYCOLAX) 17 g packet Take 17 g by mouth 2 (two) times daily. 60 each 1   rosuvastatin (CRESTOR) 20 MG tablet TAKE ONE TABLET BY MOUTH ONCE DAILY **PATIENT NEEDS APPOINTMENT FOR FUTURE REFILLS** 14 tablet 0   TRELEGY ELLIPTA 100-62.5-25 MCG/INH AEPB inhale ONE PUFF into THE lungs DAILY 60 each 3   triamcinolone cream (KENALOG) 0.1 % Apply 1 application topically daily as needed.     valsartan (DIOVAN) 40 MG tablet Take 1 tablet (40 mg total) by mouth daily. For Heart and blood pressure 30 tablet 5    Fluticasone-Umeclidin-Vilant (TRELEGY ELLIPTA) 100-62.5-25 MCG/ACT AEPB Inhale 1 puff into the lungs daily. 14 each 0   No facility-administered medications prior to visit.     Review of Systems:   Constitutional: No weight loss or gain, night sweats, fevers, chills. +fatigue, lassitude. HEENT: No headaches, difficulty swallowing, tooth/dental problems, or sore throat. No sneezing, itching, ear ache, nasal congestion, or post nasal drip CV:  No chest pain, orthopnea, PND, swelling in lower extremities, anasarca, dizziness, palpitations, syncope Resp: +shortness of breath with exertion; productive cough; chest congestion. No hemoptysis. No wheezing.  No chest wall deformity GI:  +heartburn, indigestion. No abdominal pain, nausea, vomiting, diarrhea, change in bowel habits, loss of appetite, bloody stools.  MSK:  No joint pain or swelling.  No decreased range of motion.  No back pain. Neuro: No dizziness or lightheadedness.  Psych: No depression or anxiety. Mood stable.     Physical Exam:  BP 136/80 (BP Location: Right Arm, Cuff Size: Normal)   Pulse 63   Temp 98.1 F (36.7 C)   Ht '5\' 5"'$  (1.651 m)   Wt 145 lb (65.8 kg)   SpO2 96%   BMI 24.13 kg/m   GEN: Pleasant, interactive, well-kempt; eldery; in no acute distress. HEENT:  Normocephalic and atraumatic. PERRLA. Sclera white. Nasal turbinates pink, moist and patent bilaterally. No rhinorrhea present. Oropharynx pink and moist, without exudate or edema. No lesions, ulcerations, or postnasal drip.  NECK:  Supple w/ fair ROM. No JVD present. Normal carotid impulses w/o bruits. Thyroid symmetrical with no goiter or nodules palpated. No lymphadenopathy.   CV: RRR, no m/r/g, no peripheral edema. Pulses intact, +2 bilaterally. No cyanosis, pallor or clubbing. PULMONARY:  Unlabored, regular breathing. Minimal rhonchi b/l bases otherwise clear bilaterally A&P. No accessory muscle use. No dullness to percussion. GI: BS present and  normoactive. Soft, non-tender to palpation. No organomegaly or masses detected.  MSK: No erythema, warmth or tenderness. No deformities or joint swelling noted.  Neuro: A/Ox3. No focal deficits noted.   Skin: Warm, no lesions or rashe Psych: Normal affect and behavior. Judgement and thought content appropriate.     Lab Results:  CBC    Component Value Date/Time   WBC 7.6 05/11/2020 0918   RBC 4.20 (L) 05/11/2020 0918   HGB 12.4 (L) 05/11/2020 0918   HCT 36.6 (L) 05/11/2020 0918   PLT 241.0 05/11/2020 0918   MCV 87.2 05/11/2020 0918   MCH 28.2 11/25/2019 0532   MCHC 34.0 05/11/2020 0918   RDW 13.3 05/11/2020 0918   LYMPHSABS 0.8 05/11/2020 0918   MONOABS 0.6 05/11/2020 0918   EOSABS 0.6 05/11/2020 0918   BASOSABS 0.1 05/11/2020 0918    BMET    Component Value Date/Time   NA 137 11/03/2020 1115   K 3.8 11/03/2020 1115   CL 102 11/03/2020 1115   CO2 24 11/03/2020 1115   GLUCOSE 143 (H) 11/03/2020 1115   BUN 12 11/03/2020 1115   CREATININE 0.86 11/03/2020 1115   CALCIUM 9.3 11/03/2020 1115   GFRNONAA >60 11/03/2020 1115   GFRAA >60 12/15/2019 0812    BNP    Component Value Date/Time   BNP 211.0 (H) 11/24/2019 1235     Imaging:  DG Chest 2 View  Result Date: 04/03/2022 CLINICAL DATA:  Provided history: Chest congestion/productive cough. COPD exacerbation. EXAM: CHEST - 2 VIEW COMPARISON:  Prior chest radiographs 10/11/2021 and earlier. FINDINGS: A small portion of the left lung apex may be excluded from the field of view on the PA radiograph. Heart size within normal limits. Patchy airspace disease within the right upper, middle and lower lobes. No appreciable airspace consolidation on the left. Subtle focus of scarring within the left upper lobe. No evidence of pleural effusion or pneumothorax. No acute bony abnormality identified. These results will be called to the ordering clinician or representative by the Radiologist Assistant, and communication documented in  the PACS or [employer]. IMPRESSION: Patchy airspace disease within the right upper, middle and lower lobes most compatible with multifocal pneumonia. Followup PA and lateral chest radiographs are recommended in 3-4 weeks following a trial of antibiotic therapy to ensure resolution and to exclude underlying malignancy. Please note, a small portion of the left lung apex  may be excluded from the field of view on the PA radiograph. Electronically Signed   By: Kellie Simmering D.O.   On: 04/03/2022 10:58         Latest Ref Rng & Units 04/05/2021    9:23 AM 04/17/2018    2:21 PM  PFT Results  FVC-Pre L 3.07  3.02   FVC-Predicted Pre % 91  87   FVC-Post L 3.02  3.20   FVC-Predicted Post % 90  92   Pre FEV1/FVC % % 72  82   Post FEV1/FCV % % 77  77   FEV1-Pre L 2.20  2.49   FEV1-Predicted Pre % 92  100   FEV1-Post L 2.32  2.45   DLCO uncorrected ml/min/mmHg 17.62  17.74   DLCO UNC% % 83  69   DLCO corrected ml/min/mmHg 17.62    DLCO COR %Predicted % 83    DLVA Predicted % 90  86   TLC L  7.59   TLC % Predicted %  125   RV % Predicted %  199     No results found for: "NITRICOXIDE"      Assessment & Plan:   Acute exacerbation of COPD with asthma (HCC) Mild obstruction with asthma, emphysema, and chronic bronchitis; now with exacerbation related to URI and being out of maintenance inhaler. GERD could be contributing factor as well. We will check CXR to rule out superimposed infection. Treat with prednisone taper and empiric doxycycline. Restart Trelegy - provided with [facility] paperwork for patient assistance.   Patient Instructions  Continue Albuterol inhaler 2 puffs or 3 mL neb every 6 hours as needed for shortness of breath or wheezing. Notify if symptoms persist despite rescue inhaler/neb use.  Restart Trelegy 1 puff daily. Brush tongue and rinse mouth afterwards Continue Zyrtec 1 tab daily Continue flonase 2 sprays each nostril daily Continue guaifenesin 600 mg Twice daily for chest  congestion/cough Continue protonix 40 mg daily   Prednisone taper. 4 tabs for 2 days, then 3 tabs for 2 days, 2 tabs for 2 days, then 1 tab for 2 days, then stop. Take in AM with food Doxycycline 1 tab Twice daily for 7 days. Take with food. Wear sunscreen when outside while taking  Chest x ray today  Follow up in 2 weeks with Dr. Vaughan Browner or Katie Finnley Lewis,NP. If symptoms do not improve or worsen, please contact office for sooner follow up or seek emergency care.     GERD (gastroesophageal reflux disease) Breakthrough symptoms; worse at night. Advised to avoid eating 2-3 hours before bed. Elevate HOB at night. Continue PPI and add on pepcid at bedtime.    I spent 42 minutes of dedicated to the care of this patient on the date of this encounter to include pre-visit review of records, face-to-face time with the patient discussing conditions above, post visit ordering of testing, clinical documentation with the electronic health record, making appropriate referrals as documented, and communicating necessary findings to members of the patients care team.  Clayton Bibles, NP 04/03/2022  Pt aware and understands NP's role.

## 2022-04-03 NOTE — Assessment & Plan Note (Signed)
Mild obstruction with asthma, emphysema, and chronic bronchitis; now with exacerbation related to URI and being out of maintenance inhaler. GERD could be contributing factor as well. We will check CXR to rule out superimposed infection. Treat with prednisone taper and empiric doxycycline. Restart Trelegy - provided with [facility] paperwork for patient assistance.   Patient Instructions  Continue Albuterol inhaler 2 puffs or 3 mL neb every 6 hours as needed for shortness of breath or wheezing. Notify if symptoms persist despite rescue inhaler/neb use.  Restart Trelegy 1 puff daily. Brush tongue and rinse mouth afterwards Continue Zyrtec 1 tab daily Continue flonase 2 sprays each nostril daily Continue guaifenesin 600 mg Twice daily for chest congestion/cough Continue protonix 40 mg daily   Prednisone taper. 4 tabs for 2 days, then 3 tabs for 2 days, 2 tabs for 2 days, then 1 tab for 2 days, then stop. Take in AM with food Doxycycline 1 tab Twice daily for 7 days. Take with food. Wear sunscreen when outside while taking  Chest x ray today  Follow up in 2 weeks with Dr. Vaughan Browner or Katie Kalman Nylen,NP. If symptoms do not improve or worsen, please contact office for sooner follow up or seek emergency care.

## 2022-04-03 NOTE — Telephone Encounter (Signed)
Received call report on cxr done this morning:  IMPRESSION: Patchy airspace disease within the right upper, middle and lower lobes most compatible with multifocal pneumonia. Followup PA and lateral chest radiographs are recommended in 3-4 weeks following a trial of antibiotic therapy to ensure resolution and to exclude underlying malignancy.   Please note, a small portion of the left lung apex may be excluded from the field of view on the PA radiograph.     Electronically Signed   By: Kellie Simmering D.O.   On: 04/03/2022 10:58  Routing to [employer]

## 2022-04-03 NOTE — Patient Instructions (Signed)
Continue Albuterol inhaler 2 puffs or 3 mL neb every 6 hours as needed for shortness of breath or wheezing. Notify if symptoms persist despite rescue inhaler/neb use.  Restart Trelegy 1 puff daily. Brush tongue and rinse mouth afterwards Continue Zyrtec 1 tab daily Continue flonase 2 sprays each nostril daily Continue guaifenesin 600 mg Twice daily for chest congestion/cough Continue protonix 40 mg daily   Prednisone taper. 4 tabs for 2 days, then 3 tabs for 2 days, 2 tabs for 2 days, then 1 tab for 2 days, then stop. Take in AM with food Doxycycline 1 tab Twice daily for 7 days. Take with food. Wear sunscreen when outside while taking  Chest x ray today  Follow up in 2 weeks with Dr. Vaughan Beck or Travis Kenlie Seki,NP. If symptoms do not improve or worsen, please contact office for sooner follow up or seek emergency care.

## 2022-04-03 NOTE — Telephone Encounter (Signed)
Spoke to pt. Nothing further. Thanks.

## 2022-04-04 ENCOUNTER — Encounter: Payer: Self-pay | Admitting: Nurse Practitioner

## 2022-04-08 DIAGNOSIS — I10 Essential (primary) hypertension: Secondary | ICD-10-CM | POA: Diagnosis not present

## 2022-04-08 DIAGNOSIS — K5900 Constipation, unspecified: Secondary | ICD-10-CM | POA: Diagnosis not present

## 2022-04-11 DIAGNOSIS — M130 Polyarthritis, unspecified: Secondary | ICD-10-CM | POA: Diagnosis not present

## 2022-04-11 DIAGNOSIS — M542 Cervicalgia: Secondary | ICD-10-CM | POA: Diagnosis not present

## 2022-04-11 DIAGNOSIS — Z79891 Long term (current) use of opiate analgesic: Secondary | ICD-10-CM | POA: Diagnosis not present

## 2022-04-11 DIAGNOSIS — M545 Low back pain, unspecified: Secondary | ICD-10-CM | POA: Diagnosis not present

## 2022-04-11 DIAGNOSIS — M25569 Pain in unspecified knee: Secondary | ICD-10-CM | POA: Diagnosis not present

## 2022-04-12 ENCOUNTER — Encounter: Payer: Self-pay | Admitting: Nurse Practitioner

## 2022-04-12 ENCOUNTER — Telehealth: Payer: Self-pay | Admitting: Nurse Practitioner

## 2022-04-12 ENCOUNTER — Ambulatory Visit (INDEPENDENT_AMBULATORY_CARE_PROVIDER_SITE_OTHER): Payer: Medicare Other | Admitting: Nurse Practitioner

## 2022-04-12 VITALS — BP 100/60 | HR 77 | Ht 65.0 in | Wt 147.2 lb

## 2022-04-12 DIAGNOSIS — R300 Dysuria: Secondary | ICD-10-CM | POA: Diagnosis not present

## 2022-04-12 DIAGNOSIS — J189 Pneumonia, unspecified organism: Secondary | ICD-10-CM

## 2022-04-12 DIAGNOSIS — J441 Chronic obstructive pulmonary disease with (acute) exacerbation: Secondary | ICD-10-CM | POA: Diagnosis not present

## 2022-04-12 DIAGNOSIS — J45901 Unspecified asthma with (acute) exacerbation: Secondary | ICD-10-CM | POA: Diagnosis not present

## 2022-04-12 DIAGNOSIS — E119 Type 2 diabetes mellitus without complications: Secondary | ICD-10-CM

## 2022-04-12 LAB — URINALYSIS, ROUTINE W REFLEX MICROSCOPIC
Bilirubin Urine: NEGATIVE
Hgb urine dipstick: NEGATIVE
Ketones, ur: NEGATIVE
Leukocytes,Ua: NEGATIVE
Nitrite: NEGATIVE
RBC / HPF: NONE SEEN (ref 0–?)
Specific Gravity, Urine: <=1.005 — AB (ref 1.000–1.030)
Total Protein, Urine: NEGATIVE
Urine Glucose: >=1000 — AB
Urobilinogen, UA: 0.2 (ref 0.0–1.0)
WBC, UA: NONE SEEN (ref 0–?)
pH: 6 (ref 5.0–8.0)

## 2022-04-12 MED ORDER — TRELEGY ELLIPTA 100-62.5-25 MCG/ACT IN AEPB: 1.0000 | INHALATION_SPRAY | Freq: Every day | RESPIRATORY_TRACT | 0 refills | Status: DC

## 2022-04-12 NOTE — Assessment & Plan Note (Signed)
Multifocal pneumonia.  Treated with Augmentin 10 day course without any difficulties.  He is clinically improved today.  Plan to repeat imaging in 6 weeks to ensure resolution.  Patient Instructions  Continue Albuterol inhaler 2 puffs or 3 mL neb every 6 hours as needed for shortness of breath or wheezing. Notify if symptoms persist despite rescue inhaler/neb use.  Continue Trelegy 1 puff daily. Brush tongue and rinse mouth afterwards Continue Zyrtec 1 tab daily Continue flonase 2 sprays each nostril daily Continue guaifenesin 600 mg Twice daily for chest congestion/cough Continue protonix 40 mg daily    Complete augmentin as previously directed   Urinalysis - I'll call you about the results    Follow up in 6 weeks with Dr. Vaughan Browner or Alanson Aly with repeat chest x ray. If symptoms do not improve or worsen, please contact office for sooner follow up or seek emergency care.

## 2022-04-12 NOTE — Assessment & Plan Note (Signed)
Resolving exacerbation.  Improved today.  He will continue on triple therapy with Trelegy as he has good response to this and as needed albuterol.

## 2022-04-12 NOTE — Progress Notes (Unsigned)
$'@Patient'o$  ID: Travis Beck, male    DOB: October 13, 1945, 76 y.o.   MRN: [id number]  Chief Complaint  Patient presents with   Follow-up    Pt f/u after COPD exacerbation, he reports he is feeling better, no OSB/cough/wheeze to report. Inhalers are helping    Referring provider: Monico Blitz, MD  HPI: 76 year old male, former smoker followed for asthma and emphysema. He is a patient of Dr. Matilde Bash and last seen in office 04/03/2022 by [facility] NP. Past medical history significant for GERD, DM, allergic rhinitis, HTN.  TEST/EVENTS:  09/23/2020 HRCT: There is biapical pleural-parenchymal scarring.  No evidence of ILD.  There is some mild residual peribronchovascular nodularity and mild bronchiectasis, postinfectious in etiology.  Calcified granulomas.  Limited evaluation for air trapping 04/05/2021 PFT: FVC 91, FEV1 92, ratio 77, DLCOcor 83.  No significant BD; did have some mid flow reversibility 04/03/2022 CXR 2 View: there are patchy infiltratesin the right upper, middle and lower lobes compatible with multifocal pna.   10/11/2021: OV with Dr. Vaughan Browner.  Recently hospitalized at [facility] for pneumonia in February 2023.  Treated with antibiotics.  Overall feeling well since discharge.  Needs some samples of Trelegy.  Previous history of elevated eosinophils.  There was some concern for eosinophilic pneumonia but HRCT showed some postinfectious scarring and no evidence of ILD.  He is on Trelegy and doing well.  Follows with Dr. Ernst Bowler for allergic rhinitis.  GERD stable and on PPI therapy.  CXR showed improving pneumonia.  Follow-up 6 months.  04/03/2022: OV with Rogerick Baldwin NP for acute visit.  He has been out of his Trelegy for the last 2 weeks.  He used to receive it in the mail from [place] but reports that it has stopped coming. He doesn't remember receiving notice of it stopping. He has been having increased shortness of breath and cough.  He is a lot of chest congestion and is coughing up yellow  sputum. Feels like his appetite has not been the greatest.  He also feels like his energy levels are low.  Denies any fevers, chills, hemoptysis, lower extremity swelling, wheezing.  He was at church helping with baptism[employer] around 2 weeks ago as well.  Noticed that he had some minor cold-like symptoms a few days after but these went away.  He did not swab for COVID or the flu.  He is using his albuterol rescue almost every day. His PCP did treat him with z pack, which he finished a week ago and didn't notice a difference.  He also has been having trouble with indigestion at night. He takes protonix in the morning and doesn't have much trouble during the day unless he eats something that triggers his reflux. He feels like at night it gets worse and he has to take tums. Denies any difficulties swallowing, hoarse voice, changes in bowel habits or weight loss. Instructed to restart Trelegy. Started on prednisone taper. CXR showed multifocal pneumonia - treated with augmentin (allergy listed to pcn but previously tolerated ampicillin without issues and contraindicated to use levaquin as he's on Glyburide).   04/12/2022: Today - follow up Patient presents today for follow-up with his sister-in-law.  He was treated last week for multifocal pneumonia with Augmentin course.  He has 1 dose left.  He completed previously prescribed prednisone.  He is feeling much better.  Feels like his cough has significantly improved.  No longer with purulent sputum.  He also feels like his chest congestion is better  and his breathing is back to his normal.  He is having some burning with urination.  Denies any fever, frequency or difficulties starting his stream, nausea, vomiting or lower back pain.  He has been checking his sugars daily.  They have been higher than normal but ranging in the 200s.  He is using his Trelegy daily.  Has not had to use his albuterol since he was here last.  Allergies  Allergen Reactions   Penicillins Rash     No problems with ampicillin during hospitalization    Immunization History  Administered Date(s) Administered   Fluad Quad(high Dose 65+) 05/06/2020   Influenza, High Dose Seasonal PF 04/09/2017, 04/22/2018, 04/10/2019, 05/02/2019, 04/04/2021   Moderna Sars-Covid-2 Vaccination 09/24/2019, 10/20/2019, 06/22/2020   Tdap 02/04/2018    Past Medical History:  Diagnosis Date   Arthritis    Asthma    Childhood   Emphysema lung ([place])    Essential hypertension    Ischemic heart disease    Myoview 2020 indicating inferior infarct scar with mild peri-infarct ischemia - managed medically   Type 2 diabetes mellitus ([place])     Tobacco History: Social History   Tobacco Use  Smoking Status Former   Packs/day: 2.00   Years: 33.00   Total pack years: 66.00   Types: Cigarettes   Quit date: 01/22/1993   Years since quitting: 29.2  Smokeless Tobacco Never   Counseling given: Not Answered   Outpatient Medications Prior to Visit  Medication Sig Dispense Refill   acetaminophen (TYLENOL) 325 MG tablet Take 2 tablets (650 mg total) by mouth every 6 (six) hours as needed for mild pain (or Fever >/= 101). 12 tablet 0   albuterol (VENTOLIN HFA) 108 (90 Base) MCG/ACT inhaler Inhale 2 puffs into the lungs every 4 (four) hours as needed for wheezing or shortness of breath. 18 g 0   ALPRAZolam (XANAX) 1 MG tablet Take 1 tablet (1 mg total) by mouth 3 (three) times daily as needed for anxiety. 30 tablet 0   amLODipine (NORVASC) 2.5 MG tablet Take 1 tablet (2.5 mg total) by mouth daily. For BP 30 tablet 5   amoxicillin-clavulanate (AUGMENTIN) 875-125 MG tablet Take 1 tablet by mouth 2 (two) times daily for 10 days. 20 tablet 0   aspirin EC 81 MG tablet Take 1 tablet (81 mg total) by mouth daily. Swallow whole. 90 tablet 3   buprenorphine (SUBUTEX) 8 MG SUBL SL tablet Place 8 mg under the tongue daily.     cetirizine (ZYRTEC) 10 MG tablet Take 10 mg by mouth as needed for allergies (itching).      fluticasone (FLONASE) 50 MCG/ACT nasal spray Place 2 sprays into both nostrils daily.      Fluticasone-Umeclidin-Vilant (TRELEGY ELLIPTA) 100-62.5-25 MCG/ACT AEPB Inhale 1 puff into the lungs daily at 2 PM. 14 each 0   Fluticasone-Umeclidin-Vilant (TRELEGY ELLIPTA) 100-62.5-25 MCG/ACT AEPB Inhale 1 puff into the lungs daily. 28 each 4   gabapentin (NEURONTIN) 400 MG capsule Take 1 capsule (400 mg total) by mouth every 8 (eight) hours as needed.  1   glyBURIDE (DIABETA) 5 MG tablet Take 10 mg by mouth 2 (two) times daily with a meal.      guaiFENesin (MUCINEX) 600 MG 12 hr tablet Take 1 tablet (600 mg total) by mouth 2 (two) times daily. 20 tablet 2   ipratropium-albuterol (DUONEB) 0.5-2.5 (3) MG/3ML SOLN Inhale 3 mLs into the lungs every 6 (six) hours as needed.     levocetirizine (XYZAL) 5  MG tablet Take 1 tablet by mouth. occasionally     metFORMIN (GLUCOPHAGE) 1000 MG tablet Take 1,000 mg by mouth 2 (two) times daily with a meal.      pantoprazole (PROTONIX) 40 MG tablet Take 1 tablet (40 mg total) by mouth daily. 90 tablet 3   polyethylene glycol (MIRALAX / GLYCOLAX) 17 g packet Take 17 g by mouth 2 (two) times daily. 60 each 1   predniSONE (DELTASONE) 10 MG tablet 4 tabs for 2 days, then 3 tabs for 2 days, 2 tabs for 2 days, then 1 tab for 2 days, then stop 20 tablet 0   rosuvastatin (CRESTOR) 20 MG tablet TAKE ONE TABLET BY MOUTH ONCE DAILY **PATIENT NEEDS APPOINTMENT FOR FUTURE REFILLS** 14 tablet 0   TRELEGY ELLIPTA 100-62.5-25 MCG/INH AEPB inhale ONE PUFF into THE lungs DAILY 60 each 3   triamcinolone cream (KENALOG) 0.1 % Apply 1 application topically daily as needed.     valsartan (DIOVAN) 40 MG tablet Take 1 tablet (40 mg total) by mouth daily. For Heart and blood pressure 30 tablet 5   No facility-administered medications prior to visit.     Review of Systems:   Constitutional: No weight loss or gain, night sweats, fevers, chills, lassitude. +fatigue (improved) HEENT: No  headaches, difficulty swallowing, tooth/dental problems, or sore throat. No sneezing, itching, ear ache, nasal congestion, or post nasal drip CV:  No chest pain, orthopnea, PND, swelling in lower extremities, anasarca, dizziness, palpitations, syncope Resp: +shortness of breath with exertion (improved; baseline); cough (improved); chest congestion (improved). No hemoptysis. No wheezing.  No chest wall deformity GI:  No heartburn, abdominal pain, nausea, vomiting, diarrhea, change in bowel habits, loss of appetite, bloody stools.  GU: +dysuria. No increased frequency, hematuria, flank pain MSK:  No joint pain or swelling.  No decreased range of motion.  No back pain. Neuro: No dizziness or lightheadedness.  Psych: No depression or anxiety. Mood stable.     Physical Exam:  BP 100/60   Pulse 77   Ht '5\' 5"'$  (1.651 m)   Wt 147 lb 3.2 oz (66.8 kg)   SpO2 93%   BMI 24.50 kg/m   GEN: Pleasant, interactive, well-kempt; eldery; in no acute distress. HEENT:  Normocephalic and atraumatic. PERRLA. Sclera white. Nasal turbinates pink, moist and patent bilaterally. No rhinorrhea present. Oropharynx pink and moist, without exudate or edema. No lesions, ulcerations, or postnasal drip.  NECK:  Supple w/ fair ROM. No lymphadenopathy.   CV: RRR, no m/r/g, no peripheral edema. Pulses intact, +2 bilaterally. No cyanosis, pallor or clubbing. PULMONARY:  Unlabored, regular breathing. Clear A&P w/o wheezes/rales/rhonchi. No accessory muscle use. No dullness to percussion. GI: BS present and normoactive. Soft, non-tender to palpation. No organomegaly or masses detected. No CVA tenderness. MSK: No erythema, warmth or tenderness. No deformities or joint swelling noted.  Neuro: A/Ox3. No focal deficits noted.   Skin: Warm, no lesions or rashe Psych: Normal affect and behavior. Judgement and thought content appropriate.     Lab Results:  CBC    Component Value Date/Time   WBC 7.6 05/11/2020 0918   RBC 4.20  (L) 05/11/2020 0918   HGB 12.4 (L) 05/11/2020 0918   HCT 36.6 (L) 05/11/2020 0918   PLT 241.0 05/11/2020 0918   MCV 87.2 05/11/2020 0918   MCH 28.2 11/25/2019 0532   MCHC 34.0 05/11/2020 0918   RDW 13.3 05/11/2020 0918   LYMPHSABS 0.8 05/11/2020 0918   MONOABS 0.6 05/11/2020 0918   EOSABS 0.6  05/11/2020 0918   BASOSABS 0.1 05/11/2020 0918    BMET    Component Value Date/Time   NA 137 11/03/2020 1115   K 3.8 11/03/2020 1115   CL 102 11/03/2020 1115   CO2 24 11/03/2020 1115   GLUCOSE 143 (H) 11/03/2020 1115   BUN 12 11/03/2020 1115   CREATININE 0.86 11/03/2020 1115   CALCIUM 9.3 11/03/2020 1115   GFRNONAA >60 11/03/2020 1115   GFRAA >60 12/15/2019 0812    BNP    Component Value Date/Time   BNP 211.0 (H) 11/24/2019 1235     Imaging:  DG Chest 2 View  Result Date: 04/03/2022 CLINICAL DATA:  Provided history: Chest congestion/productive cough. COPD exacerbation. EXAM: CHEST - 2 VIEW COMPARISON:  Prior chest radiographs 10/11/2021 and earlier. FINDINGS: A small portion of the left lung apex may be excluded from the field of view on the PA radiograph. Heart size within normal limits. Patchy airspace disease within the right upper, middle and lower lobes. No appreciable airspace consolidation on the left. Subtle focus of scarring within the left upper lobe. No evidence of pleural effusion or pneumothorax. No acute bony abnormality identified. These results will be called to the ordering clinician or representative by the Radiologist Assistant, and communication documented in the PACS or [employer]. IMPRESSION: Patchy airspace disease within the right upper, middle and lower lobes most compatible with multifocal pneumonia. Followup PA and lateral chest radiographs are recommended in 3-4 weeks following a trial of antibiotic therapy to ensure resolution and to exclude underlying malignancy. Please note, a small portion of the left lung apex may be excluded from the field of view  on the PA radiograph. Electronically Signed   By: Kellie Simmering D.O.   On: 04/03/2022 10:58         Latest Ref Rng & Units 04/05/2021    9:23 AM 04/17/2018    2:21 PM  PFT Results  FVC-Pre L 3.07  3.02   FVC-Predicted Pre % 91  87   FVC-Post L 3.02  3.20   FVC-Predicted Post % 90  92   Pre FEV1/FVC % % 72  82   Post FEV1/FCV % % 77  77   FEV1-Pre L 2.20  2.49   FEV1-Predicted Pre % 92  100   FEV1-Post L 2.32  2.45   DLCO uncorrected ml/min/mmHg 17.62  17.74   DLCO UNC% % 83  69   DLCO corrected ml/min/mmHg 17.62    DLCO COR %Predicted % 83    DLVA Predicted % 90  86   TLC L  7.59   TLC % Predicted %  125   RV % Predicted %  199     No results found for: "NITRICOXIDE"      Assessment & Plan:   Multifocal pneumonia Multifocal pneumonia.  Treated with Augmentin 10 day course without any difficulties.  He is clinically improved today.  Plan to repeat imaging in 6 weeks to ensure resolution.  Patient Instructions  Continue Albuterol inhaler 2 puffs or 3 mL neb every 6 hours as needed for shortness of breath or wheezing. Notify if symptoms persist despite rescue inhaler/neb use.  Continue Trelegy 1 puff daily. Brush tongue and rinse mouth afterwards Continue Zyrtec 1 tab daily Continue flonase 2 sprays each nostril daily Continue guaifenesin 600 mg Twice daily for chest congestion/cough Continue protonix 40 mg daily    Complete augmentin as previously directed   Urinalysis - I'll call you about the results  Follow up in 6 weeks with Dr. Vaughan Browner or Alanson Aly with repeat chest x ray. If symptoms do not improve or worsen, please contact office for sooner follow up or seek emergency care.     Acute exacerbation of COPD with asthma ([place]) Resolving exacerbation.  Improved today.  He will continue on triple therapy with Trelegy as he has good response to this and as needed albuterol.  Dysuria New symptom over the last few days to a week per his report.  No other  urinary or infectious symptoms.  He has been on Augmentin so low suspicion for UTI.  He is a diabetic and was on prednisone so possibly related to glucose? Check urinalysis for further evaluation.  Continue to monitor blood sugars at home.  Instructed to go to the ED if he were to have levels >350     I spent 32 minutes of dedicated to the care of this patient on the date of this encounter to include pre-visit review of records, face-to-face time with the patient discussing conditions above, post visit ordering of testing, clinical documentation with the electronic health record, making appropriate referrals as documented, and communicating necessary findings to members of the patients care team.  Clayton Bibles, NP 04/12/2022  Pt aware and understands NP's role.

## 2022-04-12 NOTE — Patient Instructions (Signed)
Continue Albuterol inhaler 2 puffs or 3 mL neb every 6 hours as needed for shortness of breath or wheezing. Notify if symptoms persist despite rescue inhaler/neb use.  Continue Trelegy 1 puff daily. Brush tongue and rinse mouth afterwards Continue Zyrtec 1 tab daily Continue flonase 2 sprays each nostril daily Continue guaifenesin 600 mg Twice daily for chest congestion/cough Continue protonix 40 mg daily    Complete augmentin as previously directed   Urinalysis - I'll call you about the results    Follow up in 6 weeks with Dr. Vaughan Browner or Alanson Aly with repeat chest x ray. If symptoms do not improve or worsen, please contact office for sooner follow up or seek emergency care.

## 2022-04-12 NOTE — Assessment & Plan Note (Signed)
New symptom over the last few days to a week per his report.  No other urinary or infectious symptoms.  He has been on Augmentin so low suspicion for UTI.  He is a diabetic and was on prednisone so possibly related to glucose? Check urinalysis for further evaluation.  Continue to monitor blood sugars at home.  Instructed to go to the ED if he were to have levels >350

## 2022-04-12 NOTE — Telephone Encounter (Signed)
Contacted pt and spoke with his DPR, Lelan Pons. Informed her that his UA today showed large amount of glucose (>1000) and advised that he check his BS at home. Likely the cause of his dysuria. If >350, needs to go to the ED. We will check BMET for further evaluation of kidney function and glucose. Orders placed today. No further questions.

## 2022-04-13 ENCOUNTER — Other Ambulatory Visit (HOSPITAL_COMMUNITY)
Admission: RE | Admit: 2022-04-13 | Discharge: 2022-04-13 | Disposition: A | Discharge status: Home / Self Care | Payer: Medicare Other | Source: Ambulatory Visit | Attending: Nurse Practitioner | Admitting: Nurse Practitioner

## 2022-04-13 ENCOUNTER — Telehealth: Payer: Self-pay | Admitting: Nurse Practitioner

## 2022-04-13 ENCOUNTER — Encounter: Payer: Self-pay | Admitting: Nurse Practitioner

## 2022-04-13 DIAGNOSIS — E119 Type 2 diabetes mellitus without complications: Secondary | ICD-10-CM | POA: Insufficient documentation

## 2022-04-13 LAB — BASIC METABOLIC PANEL
Anion gap: 10 (ref 5–15)
BUN: 12 mg/dL (ref 8–23)
CO2: 27 mmol/L (ref 22–32)
Calcium: 9 mg/dL (ref 8.9–10.3)
Chloride: 101 mmol/L (ref 98–111)
Creatinine, Ser: 0.89 mg/dL (ref 0.61–1.24)
GFR, Estimated: >60 mL/min (ref >60)
Glucose, Bld: 143 mg/dL — ABNORMAL HIGH (ref 70–99)
Potassium: 4.1 mmol/L (ref 3.5–5.1)
Sodium: 138 mmol/L (ref 135–145)

## 2022-04-14 NOTE — Progress Notes (Signed)
Please notify pt or his DPR, Lelan Pons, that his glucose and kidney function was normal on his blood draw. He needs to follow up with his PCP regarding the increased sugar levels in his urine to ensure this resolves. Thanks!

## 2022-04-20 ENCOUNTER — Ambulatory Visit: Payer: Medicare Other | Admitting: Nurse Practitioner

## 2022-04-25 DIAGNOSIS — D239 Other benign neoplasm of skin, unspecified: Secondary | ICD-10-CM | POA: Diagnosis not present

## 2022-04-25 DIAGNOSIS — L570 Actinic keratosis: Secondary | ICD-10-CM | POA: Diagnosis not present

## 2022-04-25 DIAGNOSIS — Z1283 Encounter for screening for malignant neoplasm of skin: Secondary | ICD-10-CM | POA: Diagnosis not present

## 2022-05-03 ENCOUNTER — Encounter: Payer: Self-pay | Admitting: *Deleted

## 2022-05-03 ENCOUNTER — Telehealth: Payer: Self-pay | Admitting: *Deleted

## 2022-05-03 DIAGNOSIS — E1169 Type 2 diabetes mellitus with other specified complication: Secondary | ICD-10-CM

## 2022-05-03 NOTE — Patient Outreach (Signed)
  Care Coordination   Initial Visit Note   05/03/2022 Name: Travis Beck MRN: [id number] DOB: 07/13/1945  Travis Beck is a 76 y.o. year old male who sees Monico Blitz, MD for primary care. I spoke with Lelan Pons, sister of  Travis Beck by phone today.  What matters to the patients health and wellness today?  Per sister, patient is in need of an MRI due to forgetting things.  State he is also have from staggering at times when walking (no falls), feels this may be due to neuropathy and/or medications taking.  Has PCP appointment within the next couple weeks, niece will take him and discuss symptoms and request.      Goals Addressed             This Visit's Progress    Care Coordination Activities       Care Coordination Interventions: Evaluation of current treatment plan related to Forgetfulness and "staggering" from neuropathy and patient's adherence to plan as established by provider Advised patient to Discuss symptoms with PCP, will discuss request for MRI Reviewed medications with patient and discussed affordability Reviewed scheduled/upcoming provider appointments including PCP next month, unsure of date, and pulmonary follow up next month Advised patient, providing education and rationale, to check cbg 2-3 times a day and record, calling provider for findings outside established parameters  Care Guide referral for meals on wheels Discussed plans with patient for ongoing care management follow up and provided patient with direct contact information for care management team Assessed social determinant of health barriers         SDOH assessments and interventions completed:  Yes  SDOH Interventions Today    Flowsheet Row Most Recent Value  SDOH Interventions   Food Insecurity Interventions Other (Comment)  [Referral to [place] Interventions Intervention Not Indicated  Transportation Interventions Intervention Not Indicated  Utilities  Interventions Intervention Not Indicated        Care Coordination Interventions Activated:  No  Care Coordination Interventions:  No, not indicated   Follow up plan: Follow up call scheduled for 11/22    Encounter Outcome:  Pt. Visit Completed   Valente David, RN, MSN, [place] Care Management Care Management Coordinator [phone]

## 2022-05-04 ENCOUNTER — Telehealth: Payer: Self-pay | Admitting: *Deleted

## 2022-05-04 NOTE — Telephone Encounter (Signed)
   Telephone encounter was:  Unsuccessful.  05/04/2022 Name: Evelio Rueda MRN: [id number] DOB: 1945/09/09  Unsuccessful outbound call made today to assist with:  Food Insecurity  Outreach Attempt:  1st Attempt  A HIPAA compliant voice message was left requesting a return call.  Instructed patient to call back at [phone].  [place] [phone] [address] , [place] [address] Email : [email or web address]

## 2022-05-05 DIAGNOSIS — Z299 Encounter for prophylactic measures, unspecified: Secondary | ICD-10-CM | POA: Diagnosis not present

## 2022-05-05 DIAGNOSIS — R0989 Other specified symptoms and signs involving the circulatory and respiratory systems: Secondary | ICD-10-CM | POA: Diagnosis not present

## 2022-05-05 DIAGNOSIS — J439 Emphysema, unspecified: Secondary | ICD-10-CM | POA: Diagnosis not present

## 2022-05-09 ENCOUNTER — Telehealth: Payer: Self-pay | Admitting: *Deleted

## 2022-05-09 NOTE — Telephone Encounter (Signed)
   Telephone encounter was:  Unsuccessful.  05/09/2022 Name: Travis Beck MRN: [id number] DOB: 03/26/1946  Unsuccessful outbound call made today to assist with:  Food Insecurity  Outreach Attempt:  2nd Attempt  A HIPAA compliant voice message was left requesting a return call.  Instructed patient to call back at [phone].  [place] [phone] [address] , [place] [address] Email : [email or web address]

## 2022-05-10 NOTE — Telephone Encounter (Signed)
Entered in error

## 2022-05-12 ENCOUNTER — Telehealth: Payer: Self-pay | Admitting: *Deleted

## 2022-05-12 NOTE — Telephone Encounter (Signed)
   Telephone encounter was:  Unsuccessful.  05/12/2022 Name: Travis Beck MRN: [id number] DOB: [birth year]  Unsuccessful outbound call made today to assist with:  Food Insecurity  Outreach Attempt:  3rd Attempt.  Referral closed unable to contact patient.  A HIPAA compliant voice message was left requesting a return call.  Instructed patient to call back at [phone]. [place] [phone] [address] , [place] [address] Email : [email or web address]

## 2022-05-15 ENCOUNTER — Encounter: Payer: Self-pay | Admitting: Nurse Practitioner

## 2022-05-15 ENCOUNTER — Ambulatory Visit (INDEPENDENT_AMBULATORY_CARE_PROVIDER_SITE_OTHER): Payer: Medicare Other | Admitting: Nurse Practitioner

## 2022-05-15 VITALS — BP 92/60 | HR 58 | Ht 65.0 in | Wt 145.0 lb

## 2022-05-15 DIAGNOSIS — J4489 Other specified chronic obstructive pulmonary disease: Secondary | ICD-10-CM | POA: Diagnosis not present

## 2022-05-15 DIAGNOSIS — R300 Dysuria: Secondary | ICD-10-CM

## 2022-05-15 DIAGNOSIS — B370 Candidal stomatitis: Secondary | ICD-10-CM | POA: Insufficient documentation

## 2022-05-15 DIAGNOSIS — J189 Pneumonia, unspecified organism: Secondary | ICD-10-CM

## 2022-05-15 MED ORDER — TRELEGY ELLIPTA 100-62.5-25 MCG/ACT IN AEPB: 1.0000 | INHALATION_SPRAY | Freq: Every day | RESPIRATORY_TRACT | 0 refills | Status: DC

## 2022-05-15 MED ORDER — NYSTATIN 100000 UNIT/ML MT SUSP: 5.0000 mL | Freq: Four times a day (QID) | OROMUCOSAL | 0 refills | Status: AC

## 2022-05-15 NOTE — Assessment & Plan Note (Signed)
Treated 9/25 with 10 day course of augmentin. He had clinically improved at follow up on 10/4 with one day left. Unfortunately, he developed worsening symptoms around a week later. He was seen by his PCP and treated with 7 day course of levaquin, which he says he finished around 1.5-2 weeks ago. He has been doing better since. Clinically improved today. We will plan to recheck CXR for resolution in 4 weeks for 6 week follow up. Strict return precautions.  Patient Instructions  Continue Albuterol inhaler 2 puffs or 3 mL neb every 6 hours as needed for shortness of breath or wheezing. Notify if symptoms persist despite rescue inhaler/neb use.  Continue Trelegy 1 puff daily. Brush tongue and rinse mouth afterwards Continue Zyrtec 1 tab daily Continue flonase 2 sprays each nostril daily Continue guaifenesin 600 mg Twice daily for chest congestion/cough Continue protonix 40 mg daily    Follow up in 4 weeks with repeat chest x ray and follow up with Dr. Vaughan Browner or Alanson Aly. If symptoms do not improve or worsen, please contact office for sooner follow up or seek emergency care.

## 2022-05-15 NOTE — Patient Instructions (Addendum)
Continue Albuterol inhaler 2 puffs or 3 mL neb every 6 hours as needed for shortness of breath or wheezing. Notify if symptoms persist despite rescue inhaler/neb use.  Continue Trelegy 1 puff daily. Brush tongue and rinse mouth afterwards Continue Zyrtec 1 tab daily Continue flonase 2 sprays each nostril daily Continue guaifenesin 600 mg Twice daily for chest congestion/cough Continue protonix 40 mg daily    Follow up in 4 weeks with repeat chest x ray and follow up with Dr. Vaughan Browner or Alanson Aly. If symptoms do not improve or worsen, please contact office for sooner follow up or seek emergency care.

## 2022-05-15 NOTE — Assessment & Plan Note (Signed)
Resolved. Previous UA with large amount of glucose; BMET following showed glucose level 134. He was advised to follow up with his PCP for monitoring. Symptoms have since resolved. Possibly due to prednisone use.

## 2022-05-15 NOTE — Progress Notes (Signed)
$'@Patient'S$  ID: Travis Beck, male    DOB: [birth year], 76 y.o.   MRN: [id number]  Chief Complaint  Patient presents with   Follow-up    Pt f/u he reports his breathing is better, SOB is back to baseline. Antibiotics did help. Pt uses Trelegy and Albuterol he agrees that they both help    Referring provider: Monico Blitz, MD  HPI: 76 year old male, former smoker followed for asthma and emphysema. He is a patient of Dr. Matilde Bash and last seen in office 04/12/2022 by [facility] NP. Past medical history significant for GERD, DM, allergic rhinitis, HTN.  TEST/EVENTS:  09/23/2020 HRCT: There is biapical pleural-parenchymal scarring.  No evidence of ILD.  There is some mild residual peribronchovascular nodularity and mild bronchiectasis, postinfectious in etiology.  Calcified granulomas.  Limited evaluation for air trapping 04/05/2021 PFT: FVC 91, FEV1 92, ratio 77, DLCOcor 83.  No significant BD; did have some mid flow reversibility 04/03/2022 CXR 2 View: there are patchy infiltratesin the right upper, middle and lower lobes compatible with multifocal pna.   10/11/2021: OV with Dr. Vaughan Browner.  Recently hospitalized at [facility] for pneumonia in February 2023.  Treated with antibiotics.  Overall feeling well since discharge.  Needs some samples of Trelegy.  Previous history of elevated eosinophils.  There was some concern for eosinophilic pneumonia but HRCT showed some postinfectious scarring and no evidence of ILD.  He is on Trelegy and doing well.  Follows with Dr. Ernst Bowler for allergic rhinitis.  GERD stable and on PPI therapy.  CXR showed improving pneumonia.  Follow-up 6 months.  04/03/2022: OV with [facility] Farrior NP for acute visit.  He has been out of his Trelegy for the last 2 weeks.  He used to receive it in the mail from [place] but reports that it has stopped coming. He doesn't remember receiving notice of it stopping. He has been having increased shortness of breath and cough.  He is a lot of chest  congestion and is coughing up yellow sputum. Feels like his appetite has not been the greatest.  He also feels like his energy levels are low.  Denies any fevers, chills, hemoptysis, lower extremity swelling, wheezing.  He was at church helping with baptism's around 2 weeks ago as well.  Noticed that he had some minor cold-like symptoms a few days after but these went away.  He did not swab for COVID or the flu.  He is using his albuterol rescue almost every day. His PCP did treat him with z pack, which he finished a week ago and didn't notice a difference.  He also has been having trouble with indigestion at night. He takes protonix in the morning and doesn't have much trouble during the day unless he eats something that triggers his reflux. He feels like at night it gets worse and he has to take tums. Denies any difficulties swallowing, hoarse voice, changes in bowel habits or weight loss. Instructed to restart Trelegy. Started on prednisone taper. CXR showed multifocal pneumonia - treated with augmentin (allergy listed to pcn but previously tolerated ampicillin without issues and contraindicated to use levaquin as he's on Glyburide).   04/12/2022: OV with Hitomi Slape NP for follow-up with his sister-in-law.  He was treated last week for multifocal pneumonia with Augmentin course.  He has 1 dose left.  He completed previously prescribed prednisone.  He is feeling much better.  Feels like his cough has significantly improved.  No longer with purulent sputum.  He also feels  like his chest congestion is better and his breathing is back to his normal.  He is having some burning with urination.  Denies any fever, frequency or difficulties starting his stream, nausea, vomiting or lower back pain.  He has been checking his sugars daily.  They have been higher than normal but ranging in the 200s.  He is using his Trelegy daily.  Has not had to use his albuterol since he was here last.   05/15/2022: Today - follow up Patient  presents today with his sister in law for follow up. Unfortunately, after I saw him last and he completed Augmentin, he began to feel worse with increased productive cough. He saw his PCP and was treated with 7 day course of levaquin, which he completed 1.5-2 weeks ago. He is feeling much better today. Breathing is back to his normal. Denies any increased cough or chest congestion. No recent fevers, chills, hemoptysis. He is eating and drinking well. No more issues with dysuria either. He continues on Trelegy daily.   Allergies  Allergen Reactions   Penicillins Rash    No problems with ampicillin during hospitalization    Immunization History  Administered Date(s) Administered   Fluad Quad(high Dose 65+) 05/06/2020   Influenza, High Dose Seasonal PF 04/09/2017, 04/22/2018, 04/10/2019, 05/02/2019, 04/04/2021   Moderna Sars-Covid-2 Vaccination 09/24/2019, 10/20/2019, 06/22/2020   Tdap 02/04/2018    Past Medical History:  Diagnosis Date   Arthritis    Asthma    Childhood   Emphysema lung ([place])    Essential hypertension    Ischemic heart disease    Myoview 2020 indicating inferior infarct scar with mild peri-infarct ischemia - managed medically   Type 2 diabetes mellitus ([place])     Tobacco History: Social History   Tobacco Use  Smoking Status Former   Packs/day: 2.00   Years: 33.00   Total pack years: 66.00   Types: Cigarettes   Quit date: 01/22/1993   Years since quitting: 29.3  Smokeless Tobacco Never   Counseling given: Not Answered   Outpatient Medications Prior to Visit  Medication Sig Dispense Refill   acetaminophen (TYLENOL) 325 MG tablet Take 2 tablets (650 mg total) by mouth every 6 (six) hours as needed for mild pain (or Fever >/= 101). 12 tablet 0   albuterol (VENTOLIN HFA) 108 (90 Base) MCG/ACT inhaler Inhale 2 puffs into the lungs every 4 (four) hours as needed for wheezing or shortness of breath. 18 g 0   ALPRAZolam (XANAX) 1 MG tablet Take 1 tablet (1 mg  total) by mouth 3 (three) times daily as needed for anxiety. 30 tablet 0   amLODipine (NORVASC) 2.5 MG tablet Take 1 tablet (2.5 mg total) by mouth daily. For BP 30 tablet 5   aspirin EC 81 MG tablet Take 1 tablet (81 mg total) by mouth daily. Swallow whole. 90 tablet 3   buprenorphine (SUBUTEX) 8 MG SUBL SL tablet Place 8 mg under the tongue daily.     cetirizine (ZYRTEC) 10 MG tablet Take 10 mg by mouth as needed for allergies (itching).     fluticasone (FLONASE) 50 MCG/ACT nasal spray Place 2 sprays into both nostrils daily.      gabapentin (NEURONTIN) 400 MG capsule Take 1 capsule (400 mg total) by mouth every 8 (eight) hours as needed.  1   glyBURIDE (DIABETA) 5 MG tablet Take 10 mg by mouth 2 (two) times daily with a meal.      guaiFENesin (MUCINEX) 600 MG 12 hr  tablet Take 1 tablet (600 mg total) by mouth 2 (two) times daily. 20 tablet 2   ipratropium-albuterol (DUONEB) 0.5-2.5 (3) MG/3ML SOLN Inhale 3 mLs into the lungs every 6 (six) hours as needed.     levocetirizine (XYZAL) 5 MG tablet Take 1 tablet by mouth. occasionally     metFORMIN (GLUCOPHAGE) 1000 MG tablet Take 1,000 mg by mouth 2 (two) times daily with a meal.      pantoprazole (PROTONIX) 40 MG tablet Take 1 tablet (40 mg total) by mouth daily. 90 tablet 3   polyethylene glycol (MIRALAX / GLYCOLAX) 17 g packet Take 17 g by mouth 2 (two) times daily. 60 each 1   predniSONE (DELTASONE) 10 MG tablet 4 tabs for 2 days, then 3 tabs for 2 days, 2 tabs for 2 days, then 1 tab for 2 days, then stop 20 tablet 0   rosuvastatin (CRESTOR) 20 MG tablet TAKE ONE TABLET BY MOUTH ONCE DAILY **PATIENT NEEDS APPOINTMENT FOR FUTURE REFILLS** 14 tablet 0   TRELEGY ELLIPTA 100-62.5-25 MCG/INH AEPB inhale ONE PUFF into THE lungs DAILY 60 each 3   triamcinolone cream (KENALOG) 0.1 % Apply 1 application topically daily as needed.     valsartan (DIOVAN) 40 MG tablet Take 1 tablet (40 mg total) by mouth daily. For Heart and blood pressure 30 tablet 5    Fluticasone-Umeclidin-Vilant (TRELEGY ELLIPTA) 100-62.5-25 MCG/ACT AEPB Inhale 1 puff into the lungs daily at 2 PM. 14 each 0   Fluticasone-Umeclidin-Vilant (TRELEGY ELLIPTA) 100-62.5-25 MCG/ACT AEPB Inhale 1 puff into the lungs daily. 28 each 4   Fluticasone-Umeclidin-Vilant (TRELEGY ELLIPTA) 100-62.5-25 MCG/ACT AEPB Inhale 1 puff into the lungs daily. 2 each 0   No facility-administered medications prior to visit.     Review of Systems:   Constitutional: No weight loss or gain, night sweats, fevers, chills, lassitude. +fatigue (improved) HEENT: No headaches, difficulty swallowing, tooth/dental problems, or sore throat. No sneezing, itching, ear ache, nasal congestion, or post nasal drip CV:  No chest pain, orthopnea, PND, swelling in lower extremities, anasarca, dizziness, palpitations, syncope Resp: +shortness of breath with exertion (baseline); cough (improved; baseline). No hemoptysis. No wheezing.  No chest wall deformity GI:  No heartburn, abdominal pain, nausea, vomiting, diarrhea, change in bowel habits, loss of appetite, bloody stools.  GU: No increased dysuria, frequency, hematuria, flank pain MSK:  No joint pain or swelling.  No decreased range of motion.  No back pain. Neuro: No dizziness or lightheadedness.  Psych: No depression or anxiety. Mood stable.     Physical Exam:  BP 92/60   Pulse (!) 58   Ht '5\' 5"'$  (1.651 m)   Wt 145 lb (65.8 kg)   SpO2 95%   BMI 24.13 kg/m   GEN: Pleasant, interactive, well-kempt; eldery; in no acute distress. HEENT:  Normocephalic and atraumatic. PERRLA. Sclera white. Nasal turbinates pink, moist and patent bilaterally. No rhinorrhea present. Oropharynx pink and moist, without exudate or edema. No lesions, ulcerations, or postnasal drip. Yellow plaques on tongue NECK:  Supple w/ fair ROM. No lymphadenopathy.   CV: RRR, no m/r/g, no peripheral edema. Pulses intact, +2 bilaterally. No cyanosis, pallor or clubbing. PULMONARY:  Unlabored,  regular breathing. Clear A&P w/o wheezes/rales/rhonchi. No accessory muscle use. No dullness to percussion. GI: BS present and normoactive. Soft, non-tender to palpation. No organomegaly or masses detected. No CVA tenderness. MSK: No erythema, warmth or tenderness. No deformities or joint swelling noted.  Neuro: A/Ox3. No focal deficits noted.   Skin: Warm, no lesions  or rashe Psych: Normal affect and behavior. Judgement and thought content appropriate.     Lab Results:  CBC    Component Value Date/Time   WBC 7.6 05/11/2020 0918   RBC 4.20 (L) 05/11/2020 0918   HGB 12.4 (L) 05/11/2020 0918   HCT 36.6 (L) 05/11/2020 0918   PLT 241.0 05/11/2020 0918   MCV 87.2 05/11/2020 0918   MCH 28.2 11/25/2019 0532   MCHC 34.0 05/11/2020 0918   RDW 13.3 05/11/2020 0918   LYMPHSABS 0.8 05/11/2020 0918   MONOABS 0.6 05/11/2020 0918   EOSABS 0.6 05/11/2020 0918   BASOSABS 0.1 05/11/2020 0918    BMET    Component Value Date/Time   NA 138 04/13/2022 0817   K 4.1 04/13/2022 0817   CL 101 04/13/2022 0817   CO2 27 04/13/2022 0817   GLUCOSE 143 (H) 04/13/2022 0817   BUN 12 04/13/2022 0817   CREATININE 0.89 04/13/2022 0817   CALCIUM 9.0 04/13/2022 0817   GFRNONAA >60 04/13/2022 0817   GFRAA >60 12/15/2019 0812    BNP    Component Value Date/Time   BNP 211.0 (H) 11/24/2019 1235     Imaging:  No results found.       Latest Ref Rng & Units 04/05/2021    9:23 AM 04/17/2018    2:21 PM  PFT Results  FVC-Pre L 3.07  3.02   FVC-Predicted Pre % 91  87   FVC-Post L 3.02  3.20   FVC-Predicted Post % 90  92   Pre FEV1/FVC % % 72  82   Post FEV1/FCV % % 77  77   FEV1-Pre L 2.20  2.49   FEV1-Predicted Pre % 92  100   FEV1-Post L 2.32  2.45   DLCO uncorrected ml/min/mmHg 17.62  17.74   DLCO UNC% % 83  69   DLCO corrected ml/min/mmHg 17.62    DLCO COR %Predicted % 83    DLVA Predicted % 90  86   TLC L  7.59   TLC % Predicted %  125   RV % Predicted %  199     No results found  for: "NITRICOXIDE"      Assessment & Plan:   Multifocal pneumonia Treated 9/25 with 10 day course of augmentin. He had clinically improved at follow up on 10/4 with one day left. Unfortunately, he developed worsening symptoms around a week later. He was seen by his PCP and treated with 7 day course of levaquin, which he says he finished around 1.5-2 weeks ago. He has been doing better since. Clinically improved today. We will plan to recheck CXR for resolution in 4 weeks for 6 week follow up. Strict return precautions.  Patient Instructions  Continue Albuterol inhaler 2 puffs or 3 mL neb every 6 hours as needed for shortness of breath or wheezing. Notify if symptoms persist despite rescue inhaler/neb use.  Continue Trelegy 1 puff daily. Brush tongue and rinse mouth afterwards Continue Zyrtec 1 tab daily Continue flonase 2 sprays each nostril daily Continue guaifenesin 600 mg Twice daily for chest congestion/cough Continue protonix 40 mg daily    Follow up in 4 weeks with repeat chest x ray and follow up with Dr. Vaughan Browner or Alanson Aly. If symptoms do not improve or worsen, please contact office for sooner follow up or seek emergency care.    COPD with asthma Resolved AECOPD/asthma related to pna. See above plan. He will continue on triple therapy regimen with Trelegy.   Wynona Canes  on exam today. Re-educated on proper oral hygiene techniques post ICS use. Verbalized understanding. Started on nystatin oral rinses x 10 days.   Dysuria Resolved. Previous UA with large amount of glucose; BMET following showed glucose level 134. He was advised to follow up with his PCP for monitoring. Symptoms have since resolved. Possibly due to prednisone use.      I spent 31 minutes of dedicated to the care of this patient on the date of this encounter to include pre-visit review of records, face-to-face time with the patient discussing conditions above, post visit ordering of testing, clinical  documentation with the electronic health record, making appropriate referrals as documented, and communicating necessary findings to members of the patients care team.  Clayton Bibles, NP 05/15/2022  Pt aware and understands NP's role.

## 2022-05-15 NOTE — Assessment & Plan Note (Signed)
Thrush on exam today. Re-educated on proper oral hygiene techniques post ICS use. Verbalized understanding. Started on nystatin oral rinses x 10 days.

## 2022-05-15 NOTE — Assessment & Plan Note (Signed)
Resolved AECOPD/asthma related to pna. See above plan. He will continue on triple therapy regimen with Trelegy.

## 2022-05-23 ENCOUNTER — Ambulatory Visit: Payer: Medicare Other | Admitting: Nurse Practitioner

## 2022-05-31 ENCOUNTER — Ambulatory Visit: Payer: Self-pay | Admitting: *Deleted

## 2022-05-31 NOTE — Patient Outreach (Signed)
  Care Coordination   05/31/2022 Name: Travis Beck MRN: [id number] DOB: [birth year]   Care Coordination Outreach Attempts:  An unsuccessful telephone outreach was attempted for a scheduled appointment today.  Follow Up Plan:  Additional outreach attempts will be made to offer the patient care coordination information and services.   Encounter Outcome:  No Answer  Care Coordination Interventions Activated:  No   Care Coordination Interventions:  No, not indicated    Chong Sicilian, BSN, RN-BC RN Care Coordinator [place]: [phone] Main #: [phone]

## 2022-06-07 DIAGNOSIS — Z0000 Encounter for general adult medical examination without abnormal findings: Secondary | ICD-10-CM | POA: Diagnosis not present

## 2022-06-07 DIAGNOSIS — E7800 Pure hypercholesterolemia, unspecified: Secondary | ICD-10-CM | POA: Diagnosis not present

## 2022-06-07 DIAGNOSIS — Z6824 Body mass index (BMI) 24.0-24.9, adult: Secondary | ICD-10-CM | POA: Diagnosis not present

## 2022-06-07 DIAGNOSIS — Z7189 Other specified counseling: Secondary | ICD-10-CM | POA: Diagnosis not present

## 2022-06-07 DIAGNOSIS — Z1331 Encounter for screening for depression: Secondary | ICD-10-CM | POA: Diagnosis not present

## 2022-06-07 DIAGNOSIS — I10 Essential (primary) hypertension: Secondary | ICD-10-CM | POA: Diagnosis not present

## 2022-06-07 DIAGNOSIS — Z125 Encounter for screening for malignant neoplasm of prostate: Secondary | ICD-10-CM | POA: Diagnosis not present

## 2022-06-07 DIAGNOSIS — Z79899 Other long term (current) drug therapy: Secondary | ICD-10-CM | POA: Diagnosis not present

## 2022-06-07 DIAGNOSIS — E1165 Type 2 diabetes mellitus with hyperglycemia: Secondary | ICD-10-CM | POA: Diagnosis not present

## 2022-06-07 DIAGNOSIS — Z299 Encounter for prophylactic measures, unspecified: Secondary | ICD-10-CM | POA: Diagnosis not present

## 2022-06-07 DIAGNOSIS — R5383 Other fatigue: Secondary | ICD-10-CM | POA: Diagnosis not present

## 2022-06-07 DIAGNOSIS — Z1339 Encounter for screening examination for other mental health and behavioral disorders: Secondary | ICD-10-CM | POA: Diagnosis not present

## 2022-06-12 ENCOUNTER — Ambulatory Visit: Payer: Medicare Other | Admitting: Pulmonary Disease

## 2022-06-15 ENCOUNTER — Ambulatory Visit (INDEPENDENT_AMBULATORY_CARE_PROVIDER_SITE_OTHER): Payer: Medicare Other

## 2022-06-15 ENCOUNTER — Ambulatory Visit (INDEPENDENT_AMBULATORY_CARE_PROVIDER_SITE_OTHER): Payer: Medicare Other | Admitting: Pulmonary Disease

## 2022-06-15 ENCOUNTER — Encounter: Payer: Self-pay | Admitting: Pulmonary Disease

## 2022-06-15 VITALS — BP 114/62 | HR 56 | Temp 97.9°F | Ht 65.0 in | Wt 150.0 lb

## 2022-06-15 DIAGNOSIS — J4489 Other specified chronic obstructive pulmonary disease: Secondary | ICD-10-CM | POA: Diagnosis not present

## 2022-06-15 DIAGNOSIS — J189 Pneumonia, unspecified organism: Secondary | ICD-10-CM | POA: Diagnosis not present

## 2022-06-15 DIAGNOSIS — R0602 Shortness of breath: Secondary | ICD-10-CM | POA: Diagnosis not present

## 2022-06-15 MED ORDER — TRELEGY ELLIPTA 100-62.5-25 MCG/ACT IN AEPB: 1.0000 | INHALATION_SPRAY | Freq: Every day | RESPIRATORY_TRACT | 0 refills | Status: DC

## 2022-06-15 MED ORDER — MONTELUKAST SODIUM 10 MG PO TABS: 10.0000 mg | ORAL_TABLET | Freq: Every day | ORAL | 11 refills | Status: AC

## 2022-06-15 NOTE — Addendum Note (Signed)
Addended by: Elton Sin on: 06/15/2022 11:38 AM   Modules accepted: Orders

## 2022-06-15 NOTE — Addendum Note (Signed)
Addended by: Elton Sin on: 06/15/2022 10:38 AM   Modules accepted: Orders

## 2022-06-15 NOTE — Progress Notes (Signed)
Travis Beck    [id number]    1946-01-20  Primary Care Physician:Shah, Weldon Picking, MD  Referring Physician: Monico Blitz, MD [address] [place],  [place] [address]  Chief complaint: Follow up for asthma, emphysema, chronic bronchitis, abnormal CT scan  HPI: 76 y.o.  with history of allergic rhinitis, hyperlipidemia, type 2 diabetes, former smoker Treated for pneumonia in May-June 2019.  He required 3 rounds of antibiotic to finally get symptoms under control.  He had imaging including CT of the chest which showed bilateral infiltrates, nodular opacities and has been referred here for further evaluation.  Follow-up CT shows resolution of infiltrate.  Evaluated by Dr. Ernst Bowler, allergy Also follows with Dr. Melony Overly for esophagitis, benign stricture and Schatzki's ring  Pets: Has a dog, no cats, birds, farm animals Occupation: Used to work from [employer].  Currently retired Exposures: No known exposures, no leak, hot tub, Jacuzzi, mold Smoking history: 35-pack-year smoker.  Quit in 1994 Travel history: No significant travel.  Interim history: He was recently hospitalized at [facility] for community-acquired pneumonia in February 2023.  Treated with antibiotics.  Since September 2023 he has had several exacerbations of COPD requiring treatment with Augmentin, Levaquin and prednisone.  Feels back to baseline now Continues on Trelegy inhaler.  Outpatient Encounter Medications as of 06/15/2022  Medication Sig   acetaminophen (TYLENOL) 325 MG tablet Take 2 tablets (650 mg total) by mouth Beck 6 (six) hours as needed for mild pain (or Fever >/= 101).   albuterol (VENTOLIN HFA) 108 (90 Base) MCG/ACT inhaler Inhale 2 puffs into the lungs Beck 4 (four) hours as needed for wheezing or shortness of breath.   ALPRAZolam (XANAX) 1 MG tablet Take 1 tablet (1 mg total) by mouth 3 (three) times daily as needed for anxiety.   amLODipine (NORVASC) 2.5 MG tablet Take 1 tablet (2.5 mg  total) by mouth daily. For BP   aspirin EC 81 MG tablet Take 1 tablet (81 mg total) by mouth daily. Swallow whole.   buprenorphine (SUBUTEX) 8 MG SUBL SL tablet Place 8 mg under the tongue daily.   cetirizine (ZYRTEC) 10 MG tablet Take 10 mg by mouth as needed for allergies (itching).   fluticasone (FLONASE) 50 MCG/ACT nasal spray Place 2 sprays into both nostrils daily.    gabapentin (NEURONTIN) 400 MG capsule Take 1 capsule (400 mg total) by mouth Beck 8 (eight) hours as needed.   glyBURIDE (DIABETA) 5 MG tablet Take 10 mg by mouth 2 (two) times daily with a meal.    guaiFENesin (MUCINEX) 600 MG 12 hr tablet Take 1 tablet (600 mg total) by mouth 2 (two) times daily.   ipratropium-albuterol (DUONEB) 0.5-2.5 (3) MG/3ML SOLN Inhale 3 mLs into the lungs Beck 6 (six) hours as needed.   levocetirizine (XYZAL) 5 MG tablet Take 1 tablet by mouth. occasionally   metFORMIN (GLUCOPHAGE) 1000 MG tablet Take 1,000 mg by mouth 2 (two) times daily with a meal.    pantoprazole (PROTONIX) 40 MG tablet Take 1 tablet (40 mg total) by mouth daily.   polyethylene glycol (MIRALAX / GLYCOLAX) 17 g packet Take 17 g by mouth 2 (two) times daily.   rosuvastatin (CRESTOR) 20 MG tablet TAKE ONE TABLET BY MOUTH ONCE DAILY **PATIENT NEEDS APPOINTMENT FOR FUTURE REFILLS**   TRELEGY ELLIPTA 100-62.5-25 MCG/INH AEPB inhale ONE PUFF into THE lungs DAILY   triamcinolone cream (KENALOG) 0.1 % Apply 1 application topically daily as needed.   valsartan (  DIOVAN) 40 MG tablet Take 1 tablet (40 mg total) by mouth daily. For Heart and blood pressure   [DISCONTINUED] Fluticasone-Umeclidin-Vilant (TRELEGY ELLIPTA) 100-62.5-25 MCG/ACT AEPB Inhale 1 puff into the lungs daily.   [DISCONTINUED] predniSONE (DELTASONE) 10 MG tablet 4 tabs for 2 days, then 3 tabs for 2 days, 2 tabs for 2 days, then 1 tab for 2 days, then stop   No facility-administered encounter medications on file as of 06/15/2022.   Physical Exam: Blood pressure 114/62,  pulse (!) 56, temperature 97.9 F (36.6 C), temperature source Oral, height '5\' 5"'$  (1.651 m), weight 150 lb (68 kg), SpO2 97 %. Gen:      No acute distress HEENT:  EOMI, sclera anicteric Neck:     No masses; no thyromegaly Lungs:    Clear to auscultation bilaterally; normal respiratory effort CV:         Regular rate and rhythm; no murmurs Abd:      + bowel sounds; soft, non-tender; no palpable masses, no distension Ext:    No edema; adequate peripheral perfusion Skin:      Warm and dry; no rash Neuro: alert and oriented x 3 Psych: normal mood and affect   Data Reviewed: Imaging CT chest 12/21/2017- bilateral peribronchovascular nodularity, groundglass opacities left greater than right.  Dominant 1.1 cm left upper lobe nodule.  Mild centrilobular, paraseptal emphysema.  Left main and three-vessel coronary atherosclerosis.  Small hiatal hernia.    CT chest 04/25/2018- improvement in left upper lobe patchy airspace opacities, mild tree-in-bud in the upper lobes and lower lobes.  Peripheral subcentimeter pulmonary nodules.,  Aortic atherosclerosis  CT scan 04/22/2019-scattered subcentimeter nodules, tree-in-bud opacities.  Multiple patchy areas of groundglass in the upper lobe.   HRCT 09/23/20-no evidence of interstitial lung disease  Chest x-ray 09/05/2021-left upper lobe dense consolidation, right lower lobe nodular opacity.   Chest x-ray 10/11/2021-resolving consolidation of the lung  Chest x-ray 04/03/2022-patchy airspace disease in the right upper middle and lower lobes   I have reviewed the images personally.  PFTs 04/17/2018 FVC 3.20 [92%), FEV1 2.45 [98%), F/F 77, TLC 125%, RV/DL 361%, DLCO 69% No obstruction, air trapping Mild reduction diffusion capacity.  04/05/2021 FVC 3.02 [90%], FEV1 2.32 [97%], F/F 77, TLC 4.57 [75%] DLCO 17.6 [83%] Normal test  ACT score 08/31/20- 17  Labs: CBC 05/11/2020-WBC 7.6, eos 8.1%, absolute eosinophil count 616 IgE 06/05/2020-160  Respiratory  allergy profile-IgE 117, RAST panel- Negative Alpha-1 antitrypsin 02/18/2019-172, PI MM Mold profile 03/06/2019-negative  Sputum cultures 11/6-20-negative  Procedures: EGD 05/19/2019- - Scar in the distal esophagus. Healed esophagitis. - Benign-appearing esophageal stenosis proximal to GEJ. Dilated. - Non-obstructing Schatzki ring. - 3 cm hiatal hernia. - Normal stomach. - Normal duodenal bulb and second portion of the duodenum.   Assessment:  Recurrent COPD exacerbation, multifocal pneumonia He has been treated with 2 rounds of antibiotics and prednisone Will get chest x-ray today to ensure that the infiltrate is clearing up  Asthma, emphysema, chronic bronchitis with exacerbation PFTs have not shown any significant obstruction but he does have emphysema and air trapping. Repeat PFT today shows normal spirometry but mildly reduced TLC.   Has a history of peripheral eosinophils.  There was concern for eosinophilic pneumonia but recent HRCT showed some postinfectious scarring but no evidence of ILD.   Is doing well on Trelegy inhaler and will continue the same  Chronic rhinitis, allergies Stable. Follows with Dr. Ernst Bowler Start Singulair  GERD, esophagitis, esophageal stricture Stable. Follows with GI and is on  PPI therapy.  Health maintenance Up-to-date with Pneumovax, flu Has been vaccinated against Covid. Received the booster but had a reaction to it.   Plan/Recommendations: - Chest x-ray - Continue Trelegy, albuterol as needed - Give sample of Trelegy - Start Singulair  Follow-up in 6 months  Marshell Garfinkel MD [facility] 06/15/2022, 10:12 AM  CC: Monico Blitz, MD

## 2022-06-15 NOTE — Patient Instructions (Signed)
Continue the Trelegy inhaler.  Will give samples Start Singulair 10 mg a day Will get chest x-ray today Follow-up in 6 months

## 2022-06-19 ENCOUNTER — Telehealth: Payer: Self-pay | Admitting: *Deleted

## 2022-06-19 DIAGNOSIS — M545 Low back pain, unspecified: Secondary | ICD-10-CM | POA: Diagnosis not present

## 2022-06-19 DIAGNOSIS — Z79891 Long term (current) use of opiate analgesic: Secondary | ICD-10-CM | POA: Diagnosis not present

## 2022-06-19 DIAGNOSIS — M25569 Pain in unspecified knee: Secondary | ICD-10-CM | POA: Diagnosis not present

## 2022-06-19 DIAGNOSIS — M542 Cervicalgia: Secondary | ICD-10-CM | POA: Diagnosis not present

## 2022-06-19 NOTE — Progress Notes (Signed)
  Care Coordination Note  06/19/2022 Name: Travis Beck MRN: [id number] DOB: Nov 23, 1945  Travis Beck is a 76 y.o. year old male who is a primary care patient of Monico Blitz, MD and is actively engaged with the care management team. I reached out to Benn Moulder by phone today to assist with re-scheduling a follow up visit with the RN Case Manager  Follow up plan: Telephone appointment with care management team member scheduled for:06/20/22  SIGNATURE Care Coordination Note  06/19/2022 Name: Travis Beck MRN: [id number] DOB: 14-Sep-1945  Travis Beck is a 76 y.o. year old male who is a primary care patient of Monico Blitz, MD and is actively engaged with the care management team. I reached out to Benn Moulder by phone today to assist with re-scheduling a follow up visit with the RN Case Manager  Follow up plan: Unsuccessful telephone outreach attempt made. A HIPAA compliant phone message was left for the patient providing contact information and requesting a return call.   [place]  Direct Dial: [phone]

## 2022-06-20 ENCOUNTER — Ambulatory Visit: Payer: Self-pay | Admitting: *Deleted

## 2022-06-20 ENCOUNTER — Encounter: Payer: Self-pay | Admitting: *Deleted

## 2022-06-20 NOTE — Patient Outreach (Signed)
Care Coordination   Follow Up Visit Note   06/20/2022 Name: Travis Beck MRN: [id number] DOB: 10/28/1945  Travis Beck is a 76 y.o. year old male who sees Monico Blitz, MD for primary care. I  spoke with sister and emergency contact, Travis Beck, by telephone today.  What matters to the patients health and wellness today?  Managing symptoms of dizziness/feeling off balance    Goals Addressed             This Visit's Progress    COMPLETED: Care Coordination Services       Care Coordination Interventions: Evaluation of current treatment plan related to Forgetfulness and "staggering" from neuropathy and patient's adherence to plan as established by provider Advised patient to Discuss symptoms with PCP, will discuss request for MRI Reviewed medications with patient and discussed affordability Reviewed scheduled/upcoming provider appointments including PCP next month, unsure of date, and pulmonary follow up next month Advised patient, providing education and rationale, to check cbg 2-3 times a day and record, calling provider for findings outside established parameters  Care Guide referral for meals on wheels Discussed plans with patient for ongoing care management follow up and provided patient with direct contact information for care management team Assessed social determinant of health barriers      Care Coordination Services       Care Coordination Interventions: Evaluation of current treatment plan related to hypertension self management and patient's adherence to plan as established by provider Reviewed medications with patient and discussed importance of compliance Discussed plans with patient for ongoing care management follow up and provided patient with direct contact information for care management team Advised patient to discuss results of recent pulmonary function test with provider Assessed social determinant of health barriers Talked with sister, Travis Beck,  regarding her concerns that his blood pressure may be too low and that may be contributing to his feeling of dizziness and being off balance Assessed ability to check blood pressure at home Per Travis Beck, he has a blood pressure monitor but she isn't sure that it's accurate Reviewed and discussed recent in office BP readings Reviewed and discussed medications and dosage Encouraged to take BP monitor to next PCP or specialty visit to compare readings for accuracy  Strongly recommended that patient check and record his blood pressure at least daily and to check and record it any time he feels lightheaded, dizzy, etc and to write down the symptom along with the reading. Take that list to PCP and specialty visits.  Provided verbal education to sister on orthostatic hypotension and fall prevention Mailed written educational materials to patient regarding blood pressure measurement, recording, orthostatic hypotension, and fall prevention Assessed hydration. Per Travis Beck, he drinks water all day and shouldn't be dehydrated Faxed Care Coordination note along with cover letter to PCP, Dr Manuella Ghazi, requesting an appointment for follow-up on blood pressure per sister's request Provided with [facility] telephone number and encouraged to reach out as needed           SDOH assessments and interventions completed:  Yes    SDOH Interventions Today    Flowsheet Row Most Recent Value  SDOH Interventions   Food Insecurity Interventions Intervention Not Indicated  Housing Interventions Intervention Not Indicated  Transportation Interventions Intervention Not Indicated  Financial Strain Interventions Intervention Not Indicated  Physical Activity Interventions [employer]       Care Coordination Interventions:  Yes, provided   Follow up plan: Follow up call scheduled for 07/21/22    Encounter Outcome:  Pt. Visit Completed   Chong Sicilian, BSN, RN-BC RN Care Coordinator [place] Direct Dial: [phone] Main #: [phone]

## 2022-06-26 DIAGNOSIS — Z6824 Body mass index (BMI) 24.0-24.9, adult: Secondary | ICD-10-CM | POA: Diagnosis not present

## 2022-06-26 DIAGNOSIS — I10 Essential (primary) hypertension: Secondary | ICD-10-CM | POA: Diagnosis not present

## 2022-06-26 DIAGNOSIS — Z299 Encounter for prophylactic measures, unspecified: Secondary | ICD-10-CM | POA: Diagnosis not present

## 2022-06-26 DIAGNOSIS — E1165 Type 2 diabetes mellitus with hyperglycemia: Secondary | ICD-10-CM | POA: Diagnosis not present

## 2022-06-26 DIAGNOSIS — J441 Chronic obstructive pulmonary disease with (acute) exacerbation: Secondary | ICD-10-CM | POA: Diagnosis not present

## 2022-06-26 DIAGNOSIS — F329 Major depressive disorder, single episode, unspecified: Secondary | ICD-10-CM | POA: Diagnosis not present

## 2022-06-28 DIAGNOSIS — R0902 Hypoxemia: Secondary | ICD-10-CM | POA: Diagnosis not present

## 2022-06-28 DIAGNOSIS — J440 Chronic obstructive pulmonary disease with acute lower respiratory infection: Secondary | ICD-10-CM | POA: Diagnosis not present

## 2022-06-28 DIAGNOSIS — Z794 Long term (current) use of insulin: Secondary | ICD-10-CM | POA: Diagnosis not present

## 2022-06-28 DIAGNOSIS — A419 Sepsis, unspecified organism: Secondary | ICD-10-CM | POA: Diagnosis not present

## 2022-06-28 DIAGNOSIS — R059 Cough, unspecified: Secondary | ICD-10-CM | POA: Diagnosis not present

## 2022-06-28 DIAGNOSIS — Z1152 Encounter for screening for COVID-19: Secondary | ICD-10-CM | POA: Diagnosis not present

## 2022-06-28 DIAGNOSIS — K219 Gastro-esophageal reflux disease without esophagitis: Secondary | ICD-10-CM | POA: Diagnosis not present

## 2022-06-28 DIAGNOSIS — J9601 Acute respiratory failure with hypoxia: Secondary | ICD-10-CM | POA: Diagnosis not present

## 2022-06-28 DIAGNOSIS — Z79899 Other long term (current) drug therapy: Secondary | ICD-10-CM | POA: Diagnosis not present

## 2022-06-28 DIAGNOSIS — Z7951 Long term (current) use of inhaled steroids: Secondary | ICD-10-CM | POA: Diagnosis not present

## 2022-06-28 DIAGNOSIS — Z880 Allergy status to penicillin: Secondary | ICD-10-CM | POA: Diagnosis not present

## 2022-06-28 DIAGNOSIS — R030 Elevated blood-pressure reading, without diagnosis of hypertension: Secondary | ICD-10-CM | POA: Diagnosis not present

## 2022-06-28 DIAGNOSIS — R509 Fever, unspecified: Secondary | ICD-10-CM | POA: Diagnosis not present

## 2022-06-28 DIAGNOSIS — D692 Other nonthrombocytopenic purpura: Secondary | ICD-10-CM | POA: Diagnosis not present

## 2022-06-28 DIAGNOSIS — Z9981 Dependence on supplemental oxygen: Secondary | ICD-10-CM | POA: Diagnosis not present

## 2022-06-28 DIAGNOSIS — I259 Chronic ischemic heart disease, unspecified: Secondary | ICD-10-CM | POA: Diagnosis not present

## 2022-06-28 DIAGNOSIS — I10 Essential (primary) hypertension: Secondary | ICD-10-CM | POA: Diagnosis not present

## 2022-06-28 DIAGNOSIS — Z299 Encounter for prophylactic measures, unspecified: Secondary | ICD-10-CM | POA: Diagnosis not present

## 2022-06-28 DIAGNOSIS — R918 Other nonspecific abnormal finding of lung field: Secondary | ICD-10-CM | POA: Diagnosis not present

## 2022-06-28 DIAGNOSIS — J449 Chronic obstructive pulmonary disease, unspecified: Secondary | ICD-10-CM | POA: Diagnosis not present

## 2022-06-28 DIAGNOSIS — J189 Pneumonia, unspecified organism: Secondary | ICD-10-CM | POA: Diagnosis not present

## 2022-06-28 DIAGNOSIS — J441 Chronic obstructive pulmonary disease with (acute) exacerbation: Secondary | ICD-10-CM | POA: Diagnosis not present

## 2022-06-28 DIAGNOSIS — R6520 Severe sepsis without septic shock: Secondary | ICD-10-CM | POA: Diagnosis not present

## 2022-06-28 DIAGNOSIS — E119 Type 2 diabetes mellitus without complications: Secondary | ICD-10-CM | POA: Diagnosis not present

## 2022-06-28 DIAGNOSIS — Z7984 Long term (current) use of oral hypoglycemic drugs: Secondary | ICD-10-CM | POA: Diagnosis not present

## 2022-06-28 DIAGNOSIS — Z87891 Personal history of nicotine dependence: Secondary | ICD-10-CM | POA: Diagnosis not present

## 2022-06-28 DIAGNOSIS — J9600 Acute respiratory failure, unspecified whether with hypoxia or hypercapnia: Secondary | ICD-10-CM | POA: Diagnosis not present

## 2022-06-28 DIAGNOSIS — Z792 Long term (current) use of antibiotics: Secondary | ICD-10-CM | POA: Diagnosis not present

## 2022-06-30 ENCOUNTER — Other Ambulatory Visit: Payer: Self-pay | Admitting: *Deleted

## 2022-06-30 DIAGNOSIS — J189 Pneumonia, unspecified organism: Secondary | ICD-10-CM

## 2022-07-06 DIAGNOSIS — I10 Essential (primary) hypertension: Secondary | ICD-10-CM | POA: Diagnosis not present

## 2022-07-06 DIAGNOSIS — Z299 Encounter for prophylactic measures, unspecified: Secondary | ICD-10-CM | POA: Diagnosis not present

## 2022-07-06 DIAGNOSIS — Z6825 Body mass index (BMI) 25.0-25.9, adult: Secondary | ICD-10-CM | POA: Diagnosis not present

## 2022-07-06 DIAGNOSIS — J439 Emphysema, unspecified: Secondary | ICD-10-CM | POA: Diagnosis not present

## 2022-07-06 DIAGNOSIS — J189 Pneumonia, unspecified organism: Secondary | ICD-10-CM | POA: Diagnosis not present

## 2022-07-17 DIAGNOSIS — Z23 Encounter for immunization: Secondary | ICD-10-CM | POA: Diagnosis not present

## 2022-07-21 ENCOUNTER — Ambulatory Visit: Payer: Self-pay | Admitting: *Deleted

## 2022-07-21 NOTE — Patient Outreach (Signed)
  Care Coordination   07/21/2022 Name: Travis Beck MRN: [id number] DOB: 1945/10/03   Care Coordination Outreach Attempts:  An unsuccessful telephone outreach was attempted for a scheduled appointment today. 2nd unsuccessful telephone follow-up.  Follow Up Plan:  Additional outreach attempts will be made to offer the patient care coordination information and services.   Encounter Outcome:  No Answer   Care Coordination Interventions:  No, not indicated    Left HIPAA compliant voicemail requesting return call for urgent care coordination/resource needs or questions. Forwarding to care guide to reschedule.   Chong Sicilian, BSN, RN-BC RN Care Coordinator [place] Direct Dial: [phone] Main #: [phone]

## 2022-07-26 DIAGNOSIS — Z299 Encounter for prophylactic measures, unspecified: Secondary | ICD-10-CM | POA: Diagnosis not present

## 2022-07-26 DIAGNOSIS — J439 Emphysema, unspecified: Secondary | ICD-10-CM | POA: Diagnosis not present

## 2022-07-26 DIAGNOSIS — Z87891 Personal history of nicotine dependence: Secondary | ICD-10-CM | POA: Diagnosis not present

## 2022-07-26 DIAGNOSIS — I10 Essential (primary) hypertension: Secondary | ICD-10-CM | POA: Diagnosis not present

## 2022-07-26 DIAGNOSIS — E1165 Type 2 diabetes mellitus with hyperglycemia: Secondary | ICD-10-CM | POA: Diagnosis not present

## 2022-07-27 ENCOUNTER — Encounter: Payer: Self-pay | Admitting: *Deleted

## 2022-07-27 ENCOUNTER — Ambulatory Visit: Payer: Self-pay | Admitting: *Deleted

## 2022-07-27 DIAGNOSIS — E1140 Type 2 diabetes mellitus with diabetic neuropathy, unspecified: Secondary | ICD-10-CM | POA: Diagnosis not present

## 2022-07-27 DIAGNOSIS — B351 Tinea unguium: Secondary | ICD-10-CM | POA: Diagnosis not present

## 2022-07-31 NOTE — Patient Outreach (Signed)
Care Coordination   Follow Up Visit Note   07/27/2022 Name: Travis Beck MRN: [id number] DOB: [birth year]  Leverett Camplin is a 77 y.o. year old male who sees Monico Blitz, MD for primary care. I spoke with  Benn Moulder and his sister, Lelan Pons, by phone today.  What matters to the patients health and wellness today?  Managing drowsiness, daytime somnolence, and staggering    Goals Addressed             This Visit's Progress    COMPLETED: Care Coordination Services       Care Coordination Interventions: Evaluation of current treatment plan related to hypertension self management and patient's adherence to plan as established by provider Reviewed medications with patient and discussed importance of compliance Discussed plans with patient for ongoing care management follow up and provided patient with direct contact information for care management team Advised patient to discuss results of recent pulmonary function test with provider Assessed social determinant of health barriers Talked with sister, Lelan Pons, regarding her concerns that his blood pressure may be too low and that may be contributing to his feeling of dizziness and being off balance Assessed ability to check blood pressure at home Per Lelan Pons, he has a blood pressure monitor but she isn't sure that it's accurate Reviewed and discussed recent in office BP readings Reviewed and discussed medications and dosage Encouraged to take BP monitor to next PCP or specialty visit to compare readings for accuracy  Strongly recommended that patient check and record his blood pressure at least daily and to check and record it any time he feels lightheaded, dizzy, etc and to write down the symptom along with the reading. Take that list to PCP and specialty visits.  Provided verbal education to sister on orthostatic hypotension and fall prevention Mailed written educational materials to patient regarding blood pressure  measurement, recording, orthostatic hypotension, and fall prevention Assessed hydration. Per Lelan Pons, he drinks water all day and shouldn't be dehydrated Faxed Care Coordination note along with cover letter to PCP, Dr Manuella Ghazi, requesting an appointment for follow-up on blood pressure per sister's request Provided with [facility] telephone number and encouraged to reach out as needed      Manage Drowsiness, Daytime Somnolence, and Staggering       Care Coordination Interventions: Reviewed medications with patient and discussed that Xanax and Subutex can cause drowsiness and feeling of being off balance and blood pressure and blood sugar medications can indirectly cause this as well due to the effect on blood pressure and blood sugar Collaborated with patient and sister, Lelan Pons, regarding current health and concerns Reviewed scheduled/upcoming provider appointments including PCP office, Ms Weyman Rodney, NP on 08/01/22 Advised patient, providing education and rationale, to check cbg daily and as needed and record, calling PCP for findings outside established parameters  Discussed plans with patient for ongoing care management follow up and provided patient with direct contact information for care management team Advised patient to discuss drowsiness, daytime somnolence, and staggering with provider Assessed social determinant of health barriers Encouraged to check and record blood pressure daily and as needed and to take log to PCP visit Encouraged to take BP monitor to next PCP or specialty visit to compare readings for accuracy         SDOH assessments and interventions completed:  Yes SDOH Interventions Today    Flowsheet Row Most Recent Value  SDOH Interventions   Food Insecurity Interventions Intervention Not Indicated  Transportation Interventions Intervention Not Indicated  Financial Strain Interventions Intervention Not Indicated  Physical Activity Interventions [employer]        Care  Coordination Interventions:  Yes, provided   Follow up plan: Follow up call scheduled for 08/16/22    Encounter Outcome:  Pt. Visit Completed   Chong Sicilian, BSN, RN-BC [place]: [phone] Main #: [phone]  *Late entry for visit on 07/27/22

## 2022-08-01 DIAGNOSIS — I10 Essential (primary) hypertension: Secondary | ICD-10-CM | POA: Diagnosis not present

## 2022-08-01 DIAGNOSIS — D692 Other nonthrombocytopenic purpura: Secondary | ICD-10-CM | POA: Diagnosis not present

## 2022-08-01 DIAGNOSIS — I700 Atherosclerosis of aorta: Secondary | ICD-10-CM | POA: Diagnosis not present

## 2022-08-01 DIAGNOSIS — K047 Periapical abscess without sinus: Secondary | ICD-10-CM | POA: Diagnosis not present

## 2022-08-01 DIAGNOSIS — Z299 Encounter for prophylactic measures, unspecified: Secondary | ICD-10-CM | POA: Diagnosis not present

## 2022-08-08 ENCOUNTER — Ambulatory Visit (INDEPENDENT_AMBULATORY_CARE_PROVIDER_SITE_OTHER): Payer: Medicare Other

## 2022-08-08 DIAGNOSIS — R918 Other nonspecific abnormal finding of lung field: Secondary | ICD-10-CM | POA: Diagnosis not present

## 2022-08-08 DIAGNOSIS — J189 Pneumonia, unspecified organism: Secondary | ICD-10-CM | POA: Diagnosis not present

## 2022-08-14 ENCOUNTER — Ambulatory Visit: Payer: Medicare Other | Attending: Nurse Practitioner | Admitting: Nurse Practitioner

## 2022-08-14 ENCOUNTER — Encounter: Payer: Self-pay | Admitting: Nurse Practitioner

## 2022-08-14 ENCOUNTER — Telehealth: Payer: Self-pay | Admitting: Pulmonary Disease

## 2022-08-14 VITALS — BP 136/78 | HR 69 | Ht 65.0 in | Wt 147.2 lb

## 2022-08-14 DIAGNOSIS — I10 Essential (primary) hypertension: Secondary | ICD-10-CM | POA: Diagnosis not present

## 2022-08-14 DIAGNOSIS — I259 Chronic ischemic heart disease, unspecified: Secondary | ICD-10-CM | POA: Diagnosis not present

## 2022-08-14 DIAGNOSIS — J449 Chronic obstructive pulmonary disease, unspecified: Secondary | ICD-10-CM | POA: Diagnosis not present

## 2022-08-14 DIAGNOSIS — E785 Hyperlipidemia, unspecified: Secondary | ICD-10-CM | POA: Insufficient documentation

## 2022-08-14 NOTE — Patient Instructions (Signed)
Medication Instructions:  Your physician recommends that you continue on your current medications as directed. Please refer to the Current Medication list given to you today.   Labwork: None  Testing/Procedures: None  Follow-Up: Follow up with Dr. McDowell in 6 months.   Any Other Special Instructions Will Be Listed Below (If Applicable).     If you need a refill on your cardiac medications before your next appointment, please call your pharmacy.  

## 2022-08-14 NOTE — Progress Notes (Unsigned)
Cardiology Office Note:    Date:  08/14/2022  ID:  Delfin, Squillace 1945-10-05, MRN [id number]  PCP:  Monico Blitz, [place] Providers Cardiologist:  Rozann Lesches, MD     Referring MD: Monico Blitz, MD   CC: Here for follow-up  History of Present Illness:    Travis Beck is a 77 y.o. male with a hx of the following:   Ischemic heart disease HLD Hypertension Asthma in childhood Type 2 diabetes COPD  Patient is a very pleasant 77 year old male with past medical history as mentioned above.  Last seen by Dr. Domenic Polite on September 30, 2020.  Denying acute cardiac complaints or issues.  Baby aspirin was added to medication regimen.  Blood pressure was well-controlled.  Was told to follow-up in 6 months.  Today he presents for overdue follow-up.  He states he is doing well.  He is seeing a pulmonologist in [place] for history of emphysema.  Overall he is doing well from a cardiac perspective.  Does admit to random, nagging pain in chest, that occurs seldomly and very minimal per his report, attributes to gas. Denies any anginal chest pain, shortness of breath, palpitations, syncope, presyncope, dizziness, orthopnea, PND, swelling or significant weight changes, acute bleeding, or claudication.  Compliant with his medications and tolerating well.  SH: He is very active and walks his dog every morning.  He has been raising quail for 10 years.  Past Medical History:  Diagnosis Date   Arthritis    Asthma    Childhood   Emphysema lung ([place])    Essential hypertension    Ischemic heart disease    Myoview 2020 indicating inferior infarct scar with mild peri-infarct ischemia - managed medically   Type 2 diabetes mellitus ([place])     Past Surgical History:  Procedure Laterality Date   BIOPSY  08/30/2018   Procedure: BIOPSY;  Surgeon: Rogene Houston, MD;  [facility];  Service: Endoscopy;;  gastric    BIOPSY  11/05/2020   Procedure:  BIOPSY;  Surgeon: Harvel Quale, MD;  [facility];  Service: Gastroenterology;;   COLONOSCOPY     COLONOSCOPY WITH PROPOFOL N/A 11/05/2020   Procedure: COLONOSCOPY WITH PROPOFOL;  Surgeon: Harvel Quale, MD;  [facility];  Service: Gastroenterology;  Laterality: N/A;  Am   ESOPHAGEAL DILATION N/A 05/21/2019   Procedure: ESOPHAGEAL DILATION;  Surgeon: Rogene Houston, MD;  [facility];  Service: Endoscopy;  Laterality: N/A;   ESOPHAGOGASTRODUODENOSCOPY N/A 05/21/2019   Procedure: ESOPHAGOGASTRODUODENOSCOPY (EGD);  Surgeon: Rogene Houston, MD;  [facility];  Service: Endoscopy;  Laterality: N/A;  730   ESOPHAGOGASTRODUODENOSCOPY (EGD) WITH PROPOFOL N/A 08/30/2018   Procedure: ESOPHAGOGASTRODUODENOSCOPY (EGD) WITH PROPOFOL;  Surgeon: Rogene Houston, MD;  [facility];  Service: Endoscopy;  Laterality: N/A;  1:30   ESOPHAGOGASTRODUODENOSCOPY (EGD) WITH PROPOFOL N/A 11/05/2020   Procedure: ESOPHAGOGASTRODUODENOSCOPY (EGD) WITH PROPOFOL;  Surgeon: Harvel Quale, MD;  [facility];  Service: Gastroenterology;  Laterality: N/A;   HERNIA REPAIR     bilateral lower abdomen   POLYPECTOMY  11/05/2020   Procedure: POLYPECTOMY;  Surgeon: Harvel Quale, MD;  [facility];  Service: Gastroenterology;;   Azzie Almas DILATION  11/05/2020   Procedure: Azzie Almas DILATION;  Surgeon: Montez Morita, Quillian Quince, MD;  [facility];  Service: Gastroenterology;;    Current Medications: Current Meds  Medication Sig   acetaminophen (TYLENOL)  325 MG tablet Take 2 tablets (650 mg total) by mouth every 6 (six) hours as needed for mild pain (or Fever >/= 101).   albuterol (VENTOLIN HFA) 108 (90 Base) MCG/ACT inhaler Inhale 2 puffs into the lungs every 4 (four) hours as needed for wheezing or shortness of breath.   ALPRAZolam (XANAX) 1 MG tablet Take 1 tablet (1 mg total) by mouth 3 (three)  times daily as needed for anxiety.   amLODipine (NORVASC) 2.5 MG tablet Take 1 tablet (2.5 mg total) by mouth daily. For BP   aspirin EC 81 MG tablet Take 1 tablet (81 mg total) by mouth daily. Swallow whole.   buprenorphine (SUBUTEX) 8 MG SUBL SL tablet Place 8 mg under the tongue daily.   cetirizine (ZYRTEC) 10 MG tablet Take 10 mg by mouth as needed for allergies (itching).   fluticasone (FLONASE) 50 MCG/ACT nasal spray Place 2 sprays into both nostrils daily.    Fluticasone-Umeclidin-Vilant (TRELEGY ELLIPTA) 100-62.5-25 MCG/ACT AEPB Inhale 1 puff into the lungs daily.   gabapentin (NEURONTIN) 400 MG capsule Take 1 capsule (400 mg total) by mouth every 8 (eight) hours as needed.   glyBURIDE (DIABETA) 5 MG tablet Take 10 mg by mouth 2 (two) times daily with a meal.    guaiFENesin (MUCINEX) 600 MG 12 hr tablet Take 1 tablet (600 mg total) by mouth 2 (two) times daily.   ipratropium-albuterol (DUONEB) 0.5-2.5 (3) MG/3ML SOLN Inhale 3 mLs into the lungs every 6 (six) hours as needed.   levocetirizine (XYZAL) 5 MG tablet Take 1 tablet by mouth. occasionally   metFORMIN (GLUCOPHAGE) 1000 MG tablet Take 1,000 mg by mouth 2 (two) times daily with a meal.    montelukast (SINGULAIR) 10 MG tablet Take 1 tablet (10 mg total) by mouth at bedtime.   pantoprazole (PROTONIX) 40 MG tablet Take 1 tablet (40 mg total) by mouth daily.   polyethylene glycol (MIRALAX / GLYCOLAX) 17 g packet Take 17 g by mouth 2 (two) times daily.   rosuvastatin (CRESTOR) 20 MG tablet TAKE ONE TABLET BY MOUTH ONCE DAILY **PATIENT NEEDS APPOINTMENT FOR FUTURE REFILLS**   TRELEGY ELLIPTA 100-62.5-25 MCG/INH AEPB inhale ONE PUFF into THE lungs DAILY   triamcinolone cream (KENALOG) 0.1 % Apply 1 application topically daily as needed.   valsartan (DIOVAN) 40 MG tablet Take 1 tablet (40 mg total) by mouth daily. For Heart and blood pressure     Allergies:   Penicillins     ROS:   Review of Systems  Constitutional: Negative.    HENT: Negative.    Eyes: Negative.   Respiratory: Negative.    Cardiovascular: Negative.        See HPI.   Gastrointestinal: Negative.   Genitourinary: Negative.   Musculoskeletal: Negative.   Skin: Negative.   Neurological: Negative.   Endo/Heme/Allergies: Negative.   Psychiatric/Behavioral: Negative.      Please see the history of present illness.    All other systems reviewed and are negative.  EKGs/Labs/Other Studies Reviewed:    The following studies were reviewed today:   EKG:  EKG is ordered today.  The ekg ordered today demonstrates normal sinus rhythm, 69 bpm, minimal voltage criteria for LVH, otherwise nothing acute.  Lexiscan on 09/13/2018: Blood pressure demonstrated a normal response to exercise. There was no ST segment deviation noted during stress. Findings consistent with prior moderate inferior myocardial infarction with mild peri-infarct ischemia. The left ventricular ejection fraction is normal (55-65%). This is a low risk study.  Recent Labs: 04/13/2022: BUN 12; Creatinine, Ser 0.89; Potassium 4.1; Sodium 138  Recent Lipid Panel No results found for: "CHOL", "TRIG", "HDL", "CHOLHDL", "VLDL", "LDLCALC", "LDLDIRECT"   Physical Exam:    VS:  BP 136/78 (BP Location: Right Arm, Patient Position: Sitting, Cuff Size: Normal)   Pulse 69   Ht '5\' 5"'$  (1.651 m)   Wt 147 lb 3.2 oz (66.8 kg)   SpO2 93%   BMI 24.50 kg/m     Wt Readings from Last 3 Encounters:  08/14/22 147 lb 3.2 oz (66.8 kg)  06/15/22 150 lb (68 kg)  05/15/22 145 lb (65.8 kg)     GEN: Well nourished, well developed in no acute distress HEENT: Normal NECK: No JVD; No carotid bruits CARDIAC: S1/S2, RRR, no murmurs, rubs, gallops; 2+ pulses RESPIRATORY:  Clear to auscultation without rales, wheezing or rhonchi  MUSCULOSKELETAL:  No edema; No deformity  SKIN: Warm and dry NEUROLOGIC:  Alert and oriented x 3 PSYCHIATRIC:  Normal affect   ASSESSMENT:    1. Ischemic heart disease   2.  Essential hypertension   3. Hyperlipidemia, unspecified hyperlipidemia type   4. Chronic obstructive pulmonary disease, unspecified COPD type ([place])    PLAN:    In order of problems listed above:  Ischemic heart disease Stable with no anginal symptoms. No indication for ischemic evaluation.  EKG was negative for any acute ischemic changes.  Continue aspirin, valsartan, and rosuvastatin. Heart healthy diet and regular cardiovascular exercise encouraged.   HTN Blood pressure on arrival 144/80, repeat BP 136/78.  Patient states blood pressure fluctuates at home. Discussed to monitor BP at home at least 2 hours after medications and sitting for 5-10 minutes.  Continue current medication regimen. Discussed when to contact office if BP is consistently elevated.  He verbalized understanding.  Heart healthy diet and regular cardiovascular exercise encouraged.   3. HLD LDL 42 in November 2023.  Continue rosuvastatin. Heart healthy diet and regular cardiovascular exercise encouraged.   4. COPD Breathing is stable.  Continue current medication regimen.  Continue follow-up with pulmonologist.  5.  Disposition: Follow-up with Dr. Domenic Polite in 6 months or sooner if anything changes.  Medication Adjustments/Labs and Tests Ordered: Current medicines are reviewed at length with the patient today.  Concerns regarding medicines are outlined above.  Orders Placed This Encounter  Procedures   EKG 12-Lead   No orders of the defined types were placed in this encounter.   Patient Instructions  Medication Instructions:  Your physician recommends that you continue on your current medications as directed. Please refer to the Current Medication list given to you today.   Labwork: None  Testing/Procedures: None  Follow-Up: Follow up with Dr. Domenic Polite in 6 months.   Any Other Special Instructions Will Be Listed Below (If Applicable).     If you need a refill on your cardiac medications before your  next appointment, please call your pharmacy.    Signed, Finis Bud, NP  08/15/2022 2:03 PM    [place]

## 2022-08-14 NOTE — Telephone Encounter (Signed)
Can you please advise on patients chext ray results?

## 2022-08-15 ENCOUNTER — Encounter: Payer: Self-pay | Admitting: Nurse Practitioner

## 2022-08-15 NOTE — Telephone Encounter (Signed)
ATC X1 LVM for patient to call our office back

## 2022-08-15 NOTE — Telephone Encounter (Addendum)
X ray continues to show improvement in pneumonia. Please order follow up Chest x ray in 3 months

## 2022-08-16 ENCOUNTER — Encounter: Payer: Self-pay | Admitting: *Deleted

## 2022-08-16 ENCOUNTER — Ambulatory Visit: Payer: Self-pay | Admitting: *Deleted

## 2022-08-17 NOTE — Patient Outreach (Signed)
  Care Coordination   Follow Up Visit Note   08/16/2022 Name: Travis Beck MRN: [id number] DOB: [birth year]  Travis Beck is a 77 y.o. year old male who sees Monico Blitz, MD for primary care. I spoke with  Benn Moulder by phone today.  What matters to the patients health and wellness today?  Following up with PCP office regarding daytime sleepiness and staggering    Goals Addressed             This Visit's Progress    Manage Drowsiness, Daytime Somnolence, and Staggering       Care Coordination Interventions: Reviewed medications with patient and discussed access and affordability and potential side effects of drowsiness with some of his medications.  Reviewed scheduled/upcoming provider appointments including [facility] Internal Medicine in March 2024. Unsure of specific date at this time.  Advised patient, providing education and rationale, to check cbg daily and as needed and record, calling PCP for findings outside established parameters  Discussed plans with patient for ongoing care management follow up and provided patient with direct contact information for care management team Advised patient to discuss drowsiness, daytime somnolence, and staggering with provider Assessed social determinant of health barriers Falls assessment performed Reviewed and discussed recent cardiology visit Encouraged to check and record blood pressure daily and as needed and to take log to PCP visit Encouraged to take BP monitor to next PCP or specialty visit to compare readings for accuracy           SDOH assessments and interventions completed:  Yes  SDOH Interventions Today    Flowsheet Row Most Recent Value  SDOH Interventions   Transportation Interventions Intervention Not Indicated  Financial Strain Interventions Intervention Not Indicated        Care Coordination Interventions:  Yes, provided   Follow up plan: Follow up call scheduled for 09/27/22     Encounter Outcome:  Pt. Visit Completed   Chong Sicilian, BSN, RN-BC RN Care Coordinator [place]: [phone] Main #: [phone]

## 2022-09-11 DIAGNOSIS — I10 Essential (primary) hypertension: Secondary | ICD-10-CM | POA: Diagnosis not present

## 2022-09-11 DIAGNOSIS — R1030 Lower abdominal pain, unspecified: Secondary | ICD-10-CM | POA: Diagnosis not present

## 2022-09-11 DIAGNOSIS — J439 Emphysema, unspecified: Secondary | ICD-10-CM | POA: Diagnosis not present

## 2022-09-11 DIAGNOSIS — R350 Frequency of micturition: Secondary | ICD-10-CM | POA: Diagnosis not present

## 2022-09-11 DIAGNOSIS — Z299 Encounter for prophylactic measures, unspecified: Secondary | ICD-10-CM | POA: Diagnosis not present

## 2022-09-27 ENCOUNTER — Encounter: Payer: Self-pay | Admitting: *Deleted

## 2022-09-27 ENCOUNTER — Ambulatory Visit: Payer: Self-pay | Admitting: *Deleted

## 2022-10-02 DIAGNOSIS — M549 Dorsalgia, unspecified: Secondary | ICD-10-CM | POA: Diagnosis not present

## 2022-10-02 DIAGNOSIS — M79606 Pain in leg, unspecified: Secondary | ICD-10-CM | POA: Diagnosis not present

## 2022-10-02 DIAGNOSIS — Z79899 Other long term (current) drug therapy: Secondary | ICD-10-CM | POA: Diagnosis not present

## 2022-10-02 DIAGNOSIS — M25519 Pain in unspecified shoulder: Secondary | ICD-10-CM | POA: Diagnosis not present

## 2022-10-02 DIAGNOSIS — M25511 Pain in right shoulder: Secondary | ICD-10-CM | POA: Diagnosis not present

## 2022-10-02 DIAGNOSIS — M19011 Primary osteoarthritis, right shoulder: Secondary | ICD-10-CM | POA: Diagnosis not present

## 2022-10-02 DIAGNOSIS — M19012 Primary osteoarthritis, left shoulder: Secondary | ICD-10-CM | POA: Diagnosis not present

## 2022-10-02 DIAGNOSIS — Z79891 Long term (current) use of opiate analgesic: Secondary | ICD-10-CM | POA: Diagnosis not present

## 2022-10-02 DIAGNOSIS — G894 Chronic pain syndrome: Secondary | ICD-10-CM | POA: Diagnosis not present

## 2022-10-02 DIAGNOSIS — M25512 Pain in left shoulder: Secondary | ICD-10-CM | POA: Diagnosis not present

## 2022-10-02 DIAGNOSIS — M47816 Spondylosis without myelopathy or radiculopathy, lumbar region: Secondary | ICD-10-CM | POA: Diagnosis not present

## 2022-10-05 DIAGNOSIS — Z299 Encounter for prophylactic measures, unspecified: Secondary | ICD-10-CM | POA: Diagnosis not present

## 2022-10-05 DIAGNOSIS — I10 Essential (primary) hypertension: Secondary | ICD-10-CM | POA: Diagnosis not present

## 2022-10-05 DIAGNOSIS — G2581 Restless legs syndrome: Secondary | ICD-10-CM | POA: Diagnosis not present

## 2022-10-05 DIAGNOSIS — E1165 Type 2 diabetes mellitus with hyperglycemia: Secondary | ICD-10-CM | POA: Diagnosis not present

## 2022-10-30 DIAGNOSIS — L570 Actinic keratosis: Secondary | ICD-10-CM | POA: Diagnosis not present

## 2022-10-30 DIAGNOSIS — L821 Other seborrheic keratosis: Secondary | ICD-10-CM | POA: Diagnosis not present

## 2022-10-30 DIAGNOSIS — D485 Neoplasm of uncertain behavior of skin: Secondary | ICD-10-CM | POA: Diagnosis not present

## 2022-11-09 DIAGNOSIS — Z1283 Encounter for screening for malignant neoplasm of skin: Secondary | ICD-10-CM | POA: Diagnosis not present

## 2022-11-09 DIAGNOSIS — D485 Neoplasm of uncertain behavior of skin: Secondary | ICD-10-CM | POA: Diagnosis not present

## 2022-11-14 DIAGNOSIS — E1165 Type 2 diabetes mellitus with hyperglycemia: Secondary | ICD-10-CM | POA: Diagnosis not present

## 2022-11-14 DIAGNOSIS — M2040 Other hammer toe(s) (acquired), unspecified foot: Secondary | ICD-10-CM | POA: Diagnosis not present

## 2022-11-14 DIAGNOSIS — E1140 Type 2 diabetes mellitus with diabetic neuropathy, unspecified: Secondary | ICD-10-CM | POA: Diagnosis not present

## 2022-11-14 DIAGNOSIS — I10 Essential (primary) hypertension: Secondary | ICD-10-CM | POA: Diagnosis not present

## 2022-11-14 DIAGNOSIS — Z299 Encounter for prophylactic measures, unspecified: Secondary | ICD-10-CM | POA: Diagnosis not present

## 2022-11-21 DIAGNOSIS — G894 Chronic pain syndrome: Secondary | ICD-10-CM | POA: Diagnosis not present

## 2022-11-21 DIAGNOSIS — M549 Dorsalgia, unspecified: Secondary | ICD-10-CM | POA: Diagnosis not present

## 2022-11-21 DIAGNOSIS — Z79891 Long term (current) use of opiate analgesic: Secondary | ICD-10-CM | POA: Diagnosis not present

## 2022-11-21 DIAGNOSIS — M25519 Pain in unspecified shoulder: Secondary | ICD-10-CM | POA: Diagnosis not present

## 2022-11-21 DIAGNOSIS — Z79899 Other long term (current) drug therapy: Secondary | ICD-10-CM | POA: Diagnosis not present

## 2022-11-21 DIAGNOSIS — M79606 Pain in leg, unspecified: Secondary | ICD-10-CM | POA: Diagnosis not present

## 2022-11-23 DIAGNOSIS — E1140 Type 2 diabetes mellitus with diabetic neuropathy, unspecified: Secondary | ICD-10-CM | POA: Diagnosis not present

## 2022-11-23 DIAGNOSIS — B351 Tinea unguium: Secondary | ICD-10-CM | POA: Diagnosis not present

## 2022-11-29 DIAGNOSIS — D485 Neoplasm of uncertain behavior of skin: Secondary | ICD-10-CM | POA: Diagnosis not present

## 2022-11-29 DIAGNOSIS — L570 Actinic keratosis: Secondary | ICD-10-CM | POA: Diagnosis not present

## 2022-11-30 ENCOUNTER — Emergency Department (HOSPITAL_COMMUNITY): Payer: Medicare Other

## 2022-11-30 ENCOUNTER — Encounter (HOSPITAL_COMMUNITY): Payer: Self-pay | Admitting: Internal Medicine

## 2022-11-30 ENCOUNTER — Inpatient Hospital Stay (HOSPITAL_COMMUNITY)
Admission: EM | Admit: 2022-11-30 | Discharge: 2022-12-03 | DRG: 871 | Disposition: A | Discharge status: Home / Self Care | Payer: Medicare Other | Attending: Student | Admitting: Student

## 2022-11-30 ENCOUNTER — Other Ambulatory Visit: Payer: Self-pay

## 2022-11-30 DIAGNOSIS — C4440 Unspecified malignant neoplasm of skin of scalp and neck: Secondary | ICD-10-CM | POA: Diagnosis present

## 2022-11-30 DIAGNOSIS — D649 Anemia, unspecified: Secondary | ICD-10-CM | POA: Diagnosis not present

## 2022-11-30 DIAGNOSIS — J439 Emphysema, unspecified: Secondary | ICD-10-CM | POA: Diagnosis present

## 2022-11-30 DIAGNOSIS — J9601 Acute respiratory failure with hypoxia: Secondary | ICD-10-CM | POA: Diagnosis present

## 2022-11-30 DIAGNOSIS — J811 Chronic pulmonary edema: Secondary | ICD-10-CM | POA: Diagnosis not present

## 2022-11-30 DIAGNOSIS — M549 Dorsalgia, unspecified: Secondary | ICD-10-CM | POA: Diagnosis present

## 2022-11-30 DIAGNOSIS — J9811 Atelectasis: Secondary | ICD-10-CM | POA: Diagnosis not present

## 2022-11-30 DIAGNOSIS — E1165 Type 2 diabetes mellitus with hyperglycemia: Secondary | ICD-10-CM | POA: Diagnosis not present

## 2022-11-30 DIAGNOSIS — T380X5A Adverse effect of glucocorticoids and synthetic analogues, initial encounter: Secondary | ICD-10-CM | POA: Diagnosis not present

## 2022-11-30 DIAGNOSIS — M1990 Unspecified osteoarthritis, unspecified site: Secondary | ICD-10-CM | POA: Diagnosis present

## 2022-11-30 DIAGNOSIS — J441 Chronic obstructive pulmonary disease with (acute) exacerbation: Secondary | ICD-10-CM | POA: Diagnosis not present

## 2022-11-30 DIAGNOSIS — Z821 Family history of blindness and visual loss: Secondary | ICD-10-CM

## 2022-11-30 DIAGNOSIS — Z7984 Long term (current) use of oral hypoglycemic drugs: Secondary | ICD-10-CM

## 2022-11-30 DIAGNOSIS — Z79899 Other long term (current) drug therapy: Secondary | ICD-10-CM

## 2022-11-30 DIAGNOSIS — R0602 Shortness of breath: Secondary | ICD-10-CM | POA: Diagnosis not present

## 2022-11-30 DIAGNOSIS — F419 Anxiety disorder, unspecified: Secondary | ICD-10-CM | POA: Diagnosis present

## 2022-11-30 DIAGNOSIS — A419 Sepsis, unspecified organism: Secondary | ICD-10-CM | POA: Diagnosis not present

## 2022-11-30 DIAGNOSIS — I259 Chronic ischemic heart disease, unspecified: Secondary | ICD-10-CM | POA: Diagnosis not present

## 2022-11-30 DIAGNOSIS — I10 Essential (primary) hypertension: Secondary | ICD-10-CM | POA: Diagnosis not present

## 2022-11-30 DIAGNOSIS — K219 Gastro-esophageal reflux disease without esophagitis: Secondary | ICD-10-CM | POA: Diagnosis not present

## 2022-11-30 DIAGNOSIS — N179 Acute kidney failure, unspecified: Secondary | ICD-10-CM | POA: Diagnosis present

## 2022-11-30 DIAGNOSIS — J168 Pneumonia due to other specified infectious organisms: Secondary | ICD-10-CM | POA: Diagnosis not present

## 2022-11-30 DIAGNOSIS — R410 Disorientation, unspecified: Secondary | ICD-10-CM | POA: Diagnosis present

## 2022-11-30 DIAGNOSIS — Z1152 Encounter for screening for COVID-19: Secondary | ICD-10-CM

## 2022-11-30 DIAGNOSIS — K2100 Gastro-esophageal reflux disease with esophagitis, without bleeding: Secondary | ICD-10-CM | POA: Diagnosis not present

## 2022-11-30 DIAGNOSIS — R339 Retention of urine, unspecified: Secondary | ICD-10-CM | POA: Diagnosis present

## 2022-11-30 DIAGNOSIS — J440 Chronic obstructive pulmonary disease with acute lower respiratory infection: Secondary | ICD-10-CM | POA: Diagnosis present

## 2022-11-30 DIAGNOSIS — R109 Unspecified abdominal pain: Secondary | ICD-10-CM | POA: Diagnosis present

## 2022-11-30 DIAGNOSIS — Z880 Allergy status to penicillin: Secondary | ICD-10-CM

## 2022-11-30 DIAGNOSIS — I339 Acute and subacute endocarditis, unspecified: Secondary | ICD-10-CM | POA: Diagnosis not present

## 2022-11-30 DIAGNOSIS — E871 Hypo-osmolality and hyponatremia: Secondary | ICD-10-CM | POA: Diagnosis not present

## 2022-11-30 DIAGNOSIS — R6520 Severe sepsis without septic shock: Secondary | ICD-10-CM | POA: Diagnosis not present

## 2022-11-30 DIAGNOSIS — J189 Pneumonia, unspecified organism: Principal | ICD-10-CM

## 2022-11-30 DIAGNOSIS — R9431 Abnormal electrocardiogram [ECG] [EKG]: Secondary | ICD-10-CM | POA: Diagnosis not present

## 2022-11-30 DIAGNOSIS — E872 Acidosis, unspecified: Secondary | ICD-10-CM | POA: Diagnosis present

## 2022-11-30 DIAGNOSIS — R300 Dysuria: Secondary | ICD-10-CM | POA: Diagnosis present

## 2022-11-30 DIAGNOSIS — G8929 Other chronic pain: Secondary | ICD-10-CM | POA: Diagnosis present

## 2022-11-30 DIAGNOSIS — Z87891 Personal history of nicotine dependence: Secondary | ICD-10-CM | POA: Diagnosis not present

## 2022-11-30 DIAGNOSIS — R6521 Severe sepsis with septic shock: Secondary | ICD-10-CM | POA: Diagnosis not present

## 2022-11-30 DIAGNOSIS — J45901 Unspecified asthma with (acute) exacerbation: Secondary | ICD-10-CM | POA: Diagnosis present

## 2022-11-30 DIAGNOSIS — E785 Hyperlipidemia, unspecified: Secondary | ICD-10-CM | POA: Diagnosis present

## 2022-11-30 DIAGNOSIS — Z7951 Long term (current) use of inhaled steroids: Secondary | ICD-10-CM

## 2022-11-30 DIAGNOSIS — Z7982 Long term (current) use of aspirin: Secondary | ICD-10-CM

## 2022-11-30 DIAGNOSIS — K5730 Diverticulosis of large intestine without perforation or abscess without bleeding: Secondary | ICD-10-CM | POA: Diagnosis not present

## 2022-11-30 DIAGNOSIS — R0902 Hypoxemia: Secondary | ICD-10-CM | POA: Diagnosis not present

## 2022-11-30 LAB — RESPIRATORY PANEL BY PCR

## 2022-11-30 LAB — TROPONIN I (HIGH SENSITIVITY): Troponin I (High Sensitivity): 4 ng/L (ref <18)

## 2022-11-30 LAB — URINALYSIS, W/ REFLEX TO CULTURE (INFECTION SUSPECTED)
Bacteria, UA: NONE SEEN
Bilirubin Urine: NEGATIVE
Glucose, UA: NEGATIVE mg/dL
Hgb urine dipstick: NEGATIVE
Ketones, ur: NEGATIVE mg/dL
Leukocytes,Ua: NEGATIVE
Nitrite: NEGATIVE
Protein, ur: NEGATIVE mg/dL
Specific Gravity, Urine: 1.012 (ref 1.005–1.030)
pH: 7 (ref 5.0–8.0)

## 2022-11-30 LAB — BLOOD GAS, ARTERIAL
Acid-Base Excess: 0.1 mmol/L (ref 0.0–2.0)
Bicarbonate: 23.8 mmol/L (ref 20.0–28.0)
Drawn by: 34762
O2 Saturation: 87.3 %
Patient temperature: 36.7
pCO2 arterial: 34 mmHg (ref 32–48)
pH, Arterial: 7.44 (ref 7.35–7.45)
pO2, Arterial: 50 mmHg — ABNORMAL LOW (ref 83–108)

## 2022-11-30 LAB — CBC WITH DIFFERENTIAL/PLATELET
Abs Immature Granulocytes: 0.3 10*3/uL — ABNORMAL HIGH (ref 0.00–0.07)
Band Neutrophils: 21 %
Basophils Absolute: 0.1 10*3/uL (ref 0.0–0.1)
Basophils Relative: 1 %
Eosinophils Absolute: 0 10*3/uL (ref 0.0–0.5)
Eosinophils Relative: 0 %
HCT: 41.9 % (ref 39.0–52.0)
Hemoglobin: 13.6 g/dL (ref 13.0–17.0)
Lymphocytes Relative: 10 %
Lymphs Abs: 0.7 10*3/uL (ref 0.7–4.0)
MCH: 29.8 pg (ref 26.0–34.0)
MCHC: 32.5 g/dL (ref 30.0–36.0)
MCV: 91.7 fL (ref 80.0–100.0)
Metamyelocytes Relative: 4 %
Monocytes Absolute: 0.2 10*3/uL (ref 0.1–1.0)
Monocytes Relative: 3 %
Neutro Abs: 6 10*3/uL (ref 1.7–7.7)
Neutrophils Relative %: 61 %
Platelets: 284 10*3/uL (ref 150–400)
RBC: 4.57 MIL/uL (ref 4.22–5.81)
RDW: 12.6 % (ref 11.5–15.5)
WBC Morphology: INCREASED
WBC: 7.3 10*3/uL (ref 4.0–10.5)
nRBC: 0 % (ref 0.0–0.2)

## 2022-11-30 LAB — CBG MONITORING, ED: Glucose-Capillary: 194 mg/dL — ABNORMAL HIGH (ref 70–99)

## 2022-11-30 LAB — D-DIMER, QUANTITATIVE: D-Dimer, Quant: 2.73 ug/mL-FEU — ABNORMAL HIGH (ref 0.00–0.50)

## 2022-11-30 LAB — COMPREHENSIVE METABOLIC PANEL
ALT: 15 U/L (ref 0–44)
AST: 18 U/L (ref 15–41)
Albumin: 4.3 g/dL (ref 3.5–5.0)
Alkaline Phosphatase: 59 U/L (ref 38–126)
Anion gap: 13 (ref 5–15)
BUN: 24 mg/dL — ABNORMAL HIGH (ref 8–23)
CO2: 24 mmol/L (ref 22–32)
Calcium: 9.1 mg/dL (ref 8.9–10.3)
Chloride: 94 mmol/L — ABNORMAL LOW (ref 98–111)
Creatinine, Ser: 1.36 mg/dL — ABNORMAL HIGH (ref 0.61–1.24)
GFR, Estimated: 53 mL/min — ABNORMAL LOW (ref >60)
Glucose, Bld: 194 mg/dL — ABNORMAL HIGH (ref 70–99)
Potassium: 3.9 mmol/L (ref 3.5–5.1)
Sodium: 131 mmol/L — ABNORMAL LOW (ref 135–145)
Total Bilirubin: 1.5 mg/dL — ABNORMAL HIGH (ref 0.3–1.2)
Total Protein: 7.7 g/dL (ref 6.5–8.1)

## 2022-11-30 LAB — LACTIC ACID, PLASMA
Lactic Acid, Venous: 2.8 mmol/L (ref 0.5–1.9)
Lactic Acid, Venous: 2.3 mmol/L (ref 0.5–1.9)

## 2022-11-30 LAB — CULTURE, BLOOD (ROUTINE X 2): Special Requests: ADEQUATE

## 2022-11-30 LAB — SARS CORONAVIRUS 2 BY RT PCR: SARS Coronavirus 2 by RT PCR: NEGATIVE

## 2022-11-30 LAB — GLUCOSE, CAPILLARY: Glucose-Capillary: 172 mg/dL — ABNORMAL HIGH (ref 70–99)

## 2022-11-30 MED ORDER — NOREPINEPHRINE 4 MG/250ML-% IV SOLN
2.0000 ug/min | INTRAVENOUS | Status: DC
  Administered 2022-11-30: 2 ug/min via INTRAVENOUS
  Filled 2022-11-30: qty 250

## 2022-11-30 MED ORDER — HYDRALAZINE HCL 20 MG/ML IJ SOLN: 5.0000 mg | INTRAMUSCULAR | Status: DC | PRN

## 2022-11-30 MED ORDER — FENTANYL CITRATE PF 50 MCG/ML IJ SOSY
50.0000 ug | PREFILLED_SYRINGE | Freq: Once | INTRAMUSCULAR | Status: AC
  Administered 2022-11-30: 50 ug via INTRAVENOUS
  Filled 2022-11-30: qty 1

## 2022-11-30 MED ORDER — IOHEXOL 350 MG/ML SOLN
75.0000 mL | Freq: Once | INTRAVENOUS | Status: AC | PRN
  Administered 2022-11-30: 75 mL via INTRAVENOUS

## 2022-11-30 MED ORDER — NOREPINEPHRINE 4 MG/250ML-% IV SOLN: 0.0000 ug/min | INTRAVENOUS | Status: DC

## 2022-11-30 MED ORDER — SODIUM CHLORIDE 0.9 % IV SOLN: INTRAVENOUS | Status: AC

## 2022-11-30 MED ORDER — ENOXAPARIN SODIUM 40 MG/0.4ML IJ SOSY
40.0000 mg | PREFILLED_SYRINGE | INTRAMUSCULAR | Status: DC
  Administered 2022-11-30 – 2022-12-02 (×3): 40 mg via SUBCUTANEOUS
  Filled 2022-11-30 (×3): qty 0.4

## 2022-11-30 MED ORDER — HYDROMORPHONE HCL 1 MG/ML IJ SOLN
0.5000 mg | INTRAMUSCULAR | Status: DC | PRN
  Administered 2022-11-30 – 2022-12-02 (×5): 0.5 mg via INTRAVENOUS
  Filled 2022-11-30 (×6): qty 0.5

## 2022-11-30 MED ORDER — MONTELUKAST SODIUM 10 MG PO TABS
10.0000 mg | ORAL_TABLET | Freq: Every day | ORAL | Status: DC
  Administered 2022-12-01 – 2022-12-02 (×2): 10 mg via ORAL
  Filled 2022-11-30 (×2): qty 1

## 2022-11-30 MED ORDER — SODIUM CHLORIDE 0.9 % IV BOLUS
500.0000 mL | Freq: Once | INTRAVENOUS | Status: AC
  Administered 2022-11-30: 500 mL via INTRAVENOUS

## 2022-11-30 MED ORDER — METHYLPREDNISOLONE SODIUM SUCC 40 MG IJ SOLR
40.0000 mg | INTRAMUSCULAR | Status: DC
  Administered 2022-11-30 – 2022-12-02 (×3): 40 mg via INTRAVENOUS
  Filled 2022-11-30 (×3): qty 1

## 2022-11-30 MED ORDER — FLUTICASONE FUROATE-VILANTEROL 100-25 MCG/ACT IN AEPB
1.0000 | INHALATION_SPRAY | Freq: Every day | RESPIRATORY_TRACT | Status: DC
  Administered 2022-12-01: 1 via RESPIRATORY_TRACT
  Filled 2022-11-30: qty 28

## 2022-11-30 MED ORDER — IPRATROPIUM-ALBUTEROL 0.5-2.5 (3) MG/3ML IN SOLN
3.0000 mL | Freq: Once | RESPIRATORY_TRACT | Status: AC
  Administered 2022-11-30: 3 mL via RESPIRATORY_TRACT
  Filled 2022-11-30: qty 3

## 2022-11-30 MED ORDER — SODIUM CHLORIDE 0.9 % IV SOLN
1.0000 g | Freq: Once | INTRAVENOUS | Status: AC
  Administered 2022-11-30: 1 g via INTRAVENOUS
  Filled 2022-11-30: qty 10

## 2022-11-30 MED ORDER — SODIUM CHLORIDE 0.9 % IV SOLN
500.0000 mg | Freq: Once | INTRAVENOUS | Status: AC
  Administered 2022-11-30: 500 mg via INTRAVENOUS
  Filled 2022-11-30: qty 5

## 2022-11-30 MED ORDER — BUPRENORPHINE HCL 0.3 MG/ML IJ SOLN: 0.1500 mg | Freq: Once | INTRAMUSCULAR | Status: DC

## 2022-11-30 MED ORDER — CHLORHEXIDINE GLUCONATE CLOTH 2 % EX PADS
6.0000 | MEDICATED_PAD | Freq: Every day | CUTANEOUS | Status: DC
  Administered 2022-11-30 – 2022-12-02 (×3): 6 via TOPICAL

## 2022-11-30 MED ORDER — LACTATED RINGERS IV BOLUS
1000.0000 mL | Freq: Once | INTRAVENOUS | Status: AC
  Administered 2022-11-30: 1000 mL via INTRAVENOUS

## 2022-11-30 MED ORDER — SODIUM CHLORIDE 0.9 % IV SOLN
500.0000 mg | INTRAVENOUS | Status: DC
  Administered 2022-12-01 – 2022-12-03 (×3): 500 mg via INTRAVENOUS
  Filled 2022-11-30 (×4): qty 5

## 2022-11-30 MED ORDER — SODIUM CHLORIDE 0.9 % IV SOLN
2.0000 g | INTRAVENOUS | Status: DC
  Administered 2022-12-01 – 2022-12-03 (×3): 2 g via INTRAVENOUS
  Filled 2022-11-30 (×3): qty 20

## 2022-11-30 MED ORDER — VANCOMYCIN HCL IN DEXTROSE 1-5 GM/200ML-% IV SOLN
1000.0000 mg | INTRAVENOUS | Status: DC
  Administered 2022-12-01: 1000 mg via INTRAVENOUS
  Filled 2022-11-30 (×2): qty 200

## 2022-11-30 MED ORDER — IPRATROPIUM-ALBUTEROL 0.5-2.5 (3) MG/3ML IN SOLN
3.0000 mL | RESPIRATORY_TRACT | Status: DC
  Administered 2022-11-30 – 2022-12-01 (×4): 3 mL via RESPIRATORY_TRACT
  Filled 2022-11-30 (×4): qty 3

## 2022-11-30 MED ORDER — PANTOPRAZOLE SODIUM 40 MG IV SOLR
40.0000 mg | INTRAVENOUS | Status: DC
  Administered 2022-11-30: 40 mg via INTRAVENOUS
  Filled 2022-11-30: qty 10

## 2022-11-30 MED ORDER — INSULIN ASPART 100 UNIT/ML IJ SOLN
0.0000 [IU] | INTRAMUSCULAR | Status: DC
  Administered 2022-11-30: 3 [IU] via SUBCUTANEOUS
  Administered 2022-12-01: 5 [IU] via SUBCUTANEOUS
  Administered 2022-12-01: 3 [IU] via SUBCUTANEOUS

## 2022-11-30 MED ORDER — MORPHINE SULFATE (PF) 4 MG/ML IV SOLN
4.0000 mg | Freq: Once | INTRAVENOUS | Status: AC
  Administered 2022-11-30: 4 mg via INTRAVENOUS
  Filled 2022-11-30: qty 1

## 2022-11-30 MED ORDER — MIDODRINE HCL 5 MG PO TABS
10.0000 mg | ORAL_TABLET | Freq: Three times a day (TID) | ORAL | Status: DC
  Administered 2022-12-01: 10 mg via ORAL
  Filled 2022-11-30: qty 2

## 2022-11-30 MED ORDER — UMECLIDINIUM BROMIDE 62.5 MCG/ACT IN AEPB
1.0000 | INHALATION_SPRAY | Freq: Every day | RESPIRATORY_TRACT | Status: DC
  Administered 2022-12-01: 1 via RESPIRATORY_TRACT
  Filled 2022-11-30: qty 7

## 2022-11-30 MED ORDER — HYDROMORPHONE HCL 1 MG/ML IJ SOLN: 1.0000 mg | INTRAMUSCULAR | Status: DC | PRN

## 2022-11-30 MED ORDER — SODIUM CHLORIDE 0.9 % IV SOLN
250.0000 mL | INTRAVENOUS | Status: DC
  Administered 2022-11-30: 250 mL via INTRAVENOUS

## 2022-11-30 MED ORDER — METHYLPREDNISOLONE SODIUM SUCC 125 MG IJ SOLR
125.0000 mg | Freq: Once | INTRAMUSCULAR | Status: AC
  Administered 2022-11-30: 125 mg via INTRAVENOUS
  Filled 2022-11-30: qty 2

## 2022-11-30 MED ORDER — VANCOMYCIN HCL 1500 MG/300ML IV SOLN
1500.0000 mg | Freq: Once | INTRAVENOUS | Status: AC
  Administered 2022-11-30: 1500 mg via INTRAVENOUS
  Filled 2022-11-30: qty 300

## 2022-11-30 NOTE — ED Notes (Signed)
Pt came back from CT. 

## 2022-11-30 NOTE — Progress Notes (Addendum)
Notified bedside nurse of need to draw repeat lactic acid.  Notified provider of need to order repeat lactic acid.

## 2022-11-30 NOTE — ED Triage Notes (Addendum)
Pt via POV with family reporting that pt has had urinary retention x 3 days with left flank pain and altered mental status, "talking out of his head," since yesterday. Pt states he thinks he hurt his back picking up his grandchild. Ill-appearing in triage. Pt's sister states he has had UTI previously. O2 sat 82% in triage and pt began vomiting coffee ground emesis while speaking. Pt taken directly to room. MD notified of pt presentation.

## 2022-11-30 NOTE — Progress Notes (Signed)
Pharmacy Antibiotic Note  Travis Beck is a 77 y.o. male admitted on 11/30/2022 with pneumonia.  Pharmacy has been consulted for vancomycin dosing.  Plan: Vancomycin 1500mg  (~22mg /kg) loading dose x1 followed by  Vancomycin 1000mg  IV every 24 hours.  Goal trough 15-20 mcg/mL. Goal AUC 400-600 mcg*h/mL. Plan to monitor renal function and clinical improvement. Plan to obtain trough prior to 4th dose of vancomycin.  Height: 5\' 5"  (165.1 cm) Weight: 68 kg (150 lb) IBW/kg (Calculated) : 61.5  Temp (24hrs), Avg:98.2 F (36.8 C), Min:97.8 F (36.6 C), Max:98.6 F (37 C)  Recent Labs  Lab 11/30/22 1426  WBC 7.3  CREATININE 1.36*  LATICACIDVEN 2.8*    Estimated Creatinine Clearance: 38.9 mL/min (A) (by C-G formula based on SCr of 1.36 mg/dL (H)).    Allergies  Allergen Reactions   Penicillins Rash    No problems with ampicillin during hospitalization    Antimicrobials this admission: Azithromycin 5/23 >>  ceftriaxone 5/23 >>   Dose adjustments this admission: N/a  Microbiology results: 5/23 BCx: pending 5/23 Sputum: ordered  5/23 MRSA PCR: ordered 5/23 strep pneumo urinary antigen: ordered 5/23 Legionella Ag: ordered 5/23 RVPL pending  Thank you for allowing pharmacy to be a part of this patient's care.  Travis Beck 11/30/2022 6:34 PM

## 2022-11-30 NOTE — ED Provider Notes (Signed)
[place] EMERGENCY DEPARTMENT AT [facility] Provider Note   CSN: [id number] Arrival date & time: 11/30/22  1332     History  Chief Complaint  Patient presents with   Altered Mental Status   Back Pain   Urinary Retention    Travis Beck is a 77 y.o. male.  Accompanied by his sister today for complaints of vomiting, left flank pain and some mild confusion.  He has been complaining of "not making any water" X 3 days,  but the flank pain, vomiting and confusion started today. He has not had any fever, no falls or injuries. No c/o nubmness/tingling/weakness  He does have COPD. He is not on O2 but uses CPAP at night per his sister, and uses multiple inhalers  Altered Mental Status Back Pain      Home Medications Prior to Admission medications   Medication Sig Start Date End Date Taking? Authorizing Provider  acetaminophen (TYLENOL) 325 MG tablet Take 2 tablets (650 mg total) by mouth every 6 (six) hours as needed for mild pain (or Fever >/= 101). 11/26/19  Yes Emokpae, Courage, MD  albuterol (VENTOLIN HFA) 108 (90 Base) MCG/ACT inhaler Inhale 2 puffs into the lungs every 4 (four) hours as needed for wheezing or shortness of breath. 11/26/19  Yes Emokpae, Courage, MD  ALPRAZolam Prudy Feeler) 1 MG tablet Take 1 tablet (1 mg total) by mouth 3 (three) times daily as needed for anxiety. 01/28/19  Yes Welborn, Ryan, DO  amLODipine (NORVASC) 2.5 MG tablet Take 1 tablet (2.5 mg total) by mouth daily. For BP 11/26/19  Yes Emokpae, Courage, MD  buprenorphine (SUBUTEX) 8 MG SUBL SL tablet Place 8 mg under the tongue daily. 01/04/19  Yes [provider]  cetirizine (ZYRTEC) 10 MG tablet Take 10 mg by mouth as needed for allergies (itching).   Yes [provider]  fluticasone (FLONASE) 50 MCG/ACT nasal spray Place 2 sprays into both nostrils daily.  08/22/18  Yes [provider]  gabapentin (NEURONTIN) 400 MG capsule Take 1 capsule (400 mg total) by mouth  every 8 (eight) hours as needed. 01/28/19  Yes Welborn, Ryan, DO  glyBURIDE (DIABETA) 5 MG tablet Take 10 mg by mouth 2 (two) times daily with a meal.  11/19/14  Yes [provider]  metFORMIN (GLUCOPHAGE) 1000 MG tablet Take 1,000 mg by mouth 2 (two) times daily with a meal.  12/07/14  Yes [provider]  montelukast (SINGULAIR) 10 MG tablet Take 1 tablet (10 mg total) by mouth at bedtime. 06/15/22  Yes Mannam, Praveen, MD  pantoprazole (PROTONIX) 40 MG tablet Take 1 tablet (40 mg total) by mouth daily. 12/15/20  Yes Dolores Frame, MD  valsartan (DIOVAN) 40 MG tablet Take 1 tablet (40 mg total) by mouth daily. For Heart and blood pressure 11/26/19  Yes Emokpae, Courage, MD  aspirin EC 81 MG tablet Take 1 tablet (81 mg total) by mouth daily. Swallow whole. Patient not taking: Reported on 11/30/2022 09/30/20   Jonelle Sidle, MD  Fluticasone-Umeclidin-Vilant (TRELEGY ELLIPTA) 100-62.5-25 MCG/ACT AEPB Inhale 1 puff into the lungs daily. 06/15/22   Mannam, Colbert Coyer, MD  guaiFENesin (MUCINEX) 600 MG 12 hr tablet Take 1 tablet (600 mg total) by mouth 2 (two) times daily. 11/26/19   Emokpae, Courage, MD  ipratropium-albuterol (DUONEB) 0.5-2.5 (3) MG/3ML SOLN Inhale 3 mLs into the lungs every 6 (six) hours as needed. Patient not taking: Reported on 11/30/2022    [provider]  levocetirizine (XYZAL) 5 MG  tablet Take 1 tablet by mouth. occasionally 07/23/19   [provider]  polyethylene glycol (MIRALAX / GLYCOLAX) 17 g packet Take 17 g by mouth 2 (two) times daily. Patient not taking: Reported on 11/30/2022 11/26/19   Shon Hale, MD  rosuvastatin (CRESTOR) 20 MG tablet TAKE ONE TABLET BY MOUTH ONCE DAILY **PATIENT NEEDS APPOINTMENT FOR FUTURE REFILLS** 10/11/21   Jonelle Sidle, MD  TRELEGY ELLIPTA 100-62.5-25 MCG/INH AEPB inhale ONE PUFF into THE lungs DAILY 05/24/20   Mannam, Colbert Coyer, MD  triamcinolone cream (KENALOG) 0.1 % Apply 1 application topically  daily as needed. 11/05/19   [provider]      Allergies    Penicillins    Review of Systems   Review of Systems  Musculoskeletal:  Positive for back pain.    Physical Exam Updated Vital Signs BP (!) 98/50   Pulse 92   Temp 97.8 F (36.6 C) (Oral)   Resp 18   Ht 5\' 5"  (1.651 m)   Wt 68 kg   SpO2 93%   BMI 24.96 kg/m  Physical Exam Vitals and nursing note reviewed.  Constitutional:      Appearance: He is well-developed. He is ill-appearing.     Comments: Obvious pain holding his left flank area  HENT:     Head: Normocephalic and atraumatic.     Mouth/Throat:     Mouth: Mucous membranes are moist.  Eyes:     Conjunctiva/sclera: Conjunctivae normal.  Cardiovascular:     Rate and Rhythm: Normal rate and regular rhythm.     Heart sounds: No murmur heard. Pulmonary:     Effort: Tachypnea and accessory muscle usage present.     Breath sounds: Examination of the right-upper field reveals wheezing and rhonchi. Examination of the left-upper field reveals wheezing and rhonchi. Examination of the right-middle field reveals wheezing and rhonchi. Examination of the left-middle field reveals wheezing and rhonchi. Examination of the right-lower field reveals wheezing and rhonchi. Examination of the left-lower field reveals wheezing and rhonchi. Wheezing and rhonchi present.  Abdominal:     Palpations: Abdomen is soft.     Tenderness: There is abdominal tenderness in the suprapubic area.  Musculoskeletal:        General: No swelling.     Cervical back: Neck supple.  Skin:    General: Skin is warm and dry.     Capillary Refill: Capillary refill takes less than 2 seconds.  Neurological:     Mental Status: He is alert.  Psychiatric:        Mood and Affect: Mood normal.     ED Results / Procedures / Treatments   Labs (all labs ordered are listed, but only abnormal results are displayed) Labs Reviewed  COMPREHENSIVE METABOLIC PANEL - Abnormal; Notable for the following  components:      Result Value   Sodium 131 (*)    Chloride 94 (*)    Glucose, Bld 194 (*)    BUN 24 (*)    Creatinine, Ser 1.36 (*)    Total Bilirubin 1.5 (*)    GFR, Estimated 53 (*)    All other components within normal limits  CBC WITH DIFFERENTIAL/PLATELET - Abnormal; Notable for the following components:   Abs Immature Granulocytes 0.30 (*)    All other components within normal limits  LACTIC ACID, PLASMA - Abnormal; Notable for the following components:   Lactic Acid, Venous 2.8 (*)    All other components within normal limits  BLOOD GAS, ARTERIAL -  Abnormal; Notable for the following components:   pO2, Arterial 50 (*)    All other components within normal limits  D-DIMER, QUANTITATIVE - Abnormal; Notable for the following components:   D-Dimer, Quant 2.73 (*)    All other components within normal limits  CBG MONITORING, ED - Abnormal; Notable for the following components:   Glucose-Capillary 194 (*)    All other components within normal limits  CULTURE, BLOOD (ROUTINE X 2)  CULTURE, BLOOD (ROUTINE X 2)  SARS CORONAVIRUS 2 BY RT PCR  RESPIRATORY PANEL BY PCR  EXPECTORATED SPUTUM ASSESSMENT W GRAM STAIN, RFLX TO RESP C  MRSA NEXT GEN BY PCR, NASAL  URINALYSIS, W/ REFLEX TO CULTURE (INFECTION SUSPECTED)  HIV ANTIBODY (ROUTINE TESTING W REFLEX)  LEGIONELLA PNEUMOPHILA SEROGP 1 UR AG  STREP PNEUMONIAE URINARY ANTIGEN  TROPONIN I (HIGH SENSITIVITY)    EKG None  Radiology CT Angio Chest Pulmonary Embolism (PE) W or WO Contrast  Result Date: 11/30/2022 CLINICAL DATA:  Hypoxia EXAM: CT ANGIOGRAPHY CHEST WITH CONTRAST TECHNIQUE: Multidetector CT imaging of the chest was performed using the standard protocol during bolus administration of intravenous contrast. Multiplanar CT image reconstructions and MIPs were obtained to evaluate the vascular anatomy. RADIATION DOSE REDUCTION: This exam was performed according to the departmental dose-optimization program which includes  automated exposure control, adjustment of the mA and/or kV according to patient size and/or use of iterative reconstruction technique. CONTRAST:  75mL OMNIPAQUE IOHEXOL 350 MG/ML SOLN COMPARISON:  09/23/2020 FINDINGS: Despite efforts by the technologist and patient, motion artifact is present on today's exam and could not be eliminated. This reduces exam sensitivity and specificity. Cardiovascular: No filling defect is identified in the pulmonary arterial tree to suggest pulmonary embolus. Coronary, aortic arch, and branch vessel atherosclerotic vascular disease. Mediastinum/Nodes: 1.2 cm right hilar lymph node. 1.0 cm left infrahilar lymph node. 1.2 cm subcarinal lymph node. Small type 1 hiatal hernia.  Mildly dilated distal esophagus. Lungs/Pleura: Consolidation of most of the left lower lobe aside from the superior segment. Substantial airspace opacity posteriorly in the right lower lobe, especially in the posterior basal segment. Appearance suspicious for multilobar pneumonia or aspiration pneumonitis. Patchy reticulonodular opacities are present in both upper lobes along with some bandlike atelectasis at the lung apices. Benign 3 mm calcified granuloma noted in the left upper lobe on image 66 series 6. Upper Abdomen: Abdominal aortic atherosclerosis. Separate origin of the splenic artery from the rest of the celiac trunk; atheromatous calcification at the origins of the upper abdominal aortic branches without appreciable occlusion. Musculoskeletal: Sternoclavicular arthropathy bilaterally. Review of the MIP images confirms the above findings. IMPRESSION: 1. No filling defect is identified in the pulmonary arterial tree to suggest pulmonary embolus. To motion artifact. Reduced sensitivity due 2. Consolidation of most of the left lower lobe aside from the superior segment. Substantial airspace opacity posteriorly in the right lower lobe, especially in the posterior basal segment. Appearance suspicious for  multilobar pneumonia or aspiration pneumonitis. 3. Patchy reticulonodular opacities are present in both upper lobes along with some bandlike atelectasis at the lung apices. 4. Mild bilateral hilar and subcarinal adenopathy, probably reactive. 5. Small type 1 hiatal hernia. Mildly dilated distal esophagus. 6. Aortic atherosclerosis. Aortic Atherosclerosis (ICD10-I70.0). Electronically Signed   By: Gaylyn Rong M.D.   On: 11/30/2022 18:00   CT Renal Stone Study  Result Date: 11/30/2022 CLINICAL DATA:  Abdominal/flank pain, stone suspected.  Weakening EXAM: CT ABDOMEN AND PELVIS WITHOUT CONTRAST TECHNIQUE: Multidetector CT imaging of the abdomen and  pelvis was performed following the standard protocol without IV contrast. RADIATION DOSE REDUCTION: This exam was performed according to the departmental dose-optimization program which includes automated exposure control, adjustment of the mA and/or kV according to patient size and/or use of iterative reconstruction technique. COMPARISON:  Chest radiograph performed earlier on the same date. FINDINGS: Lower chest: Bilateral lower lobe consolidations left greater than the right with air bronchograms concerning for bilateral pneumonia. Prominent atherosclerotic calcification of coronary arteries. Hepatobiliary: No focal liver abnormality is seen. No gallstones, gallbladder wall thickening, or biliary dilatation. Pancreas: Generalized pancreatic atrophy no pancreatic ductal dilatation or surrounding inflammatory changes. Spleen: Normal in size without focal abnormality. Adrenals/Urinary Tract: Adrenal glands are unremarkable. Kidneys are normal, without renal calculi, focal lesion, or hydronephrosis. Exophytic bilateral renal cysts which contains mildly complex fluid. The interpolar right renal cyst measures a proximally 2.2 x 2.7 cm and in the lower pole of the left kidney measures a proximally 2.4 x 2.3 cm. Foley's catheter in the urinary bladder with trace amount  of air likely iatrogenic. Stomach/Bowel: Stomach is within normal limits. Appendix appears normal. No evidence of bowel wall thickening, distention, or inflammatory changes. Sigmoid colonic diverticulosis without evidence of acute diverticulitis. Vascular/Lymphatic: Aortic atherosclerosis. No enlarged abdominal or pelvic lymph nodes. Reproductive: Prostate is unremarkable. Other: No abdominal wall hernia or abnormality. No abdominopelvic ascites. 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 No acute osseous abnormality. IMPRESSION: 1. Bilateral lower lobe consolidations left greater than the right with air bronchograms concerning for bilateral pneumonia. 2. No evidence of nephrolithiasis or hydronephrosis. 3. Sigmoid colonic diverticulosis without evidence of acute diverticulitis. 4. Aortic atherosclerosis and coronary artery calcification. 5. 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 No acute osseous abnormality. Aortic Atherosclerosis (ICD10-I70.0). Electronically Signed   By: Larose Hires D.O.   On: 11/30/2022 16:19   DG Chest Port 1 View  Result Date: 11/30/2022 CLINICAL DATA:  hypoxia EXAM: PORTABLE CHEST - 1 VIEW COMPARISON:  08/08/2022 FINDINGS: Relatively low lung volumes. New consolidation/atelectasis at the left lung base obscuring the diaphragmatic leaflet. Mild scattered perihilar of the lower opacities left greater than right. Heart size and mediastinal contours are within normal limits. No effusion. Visualized bones unremarkable. IMPRESSION: New left lower lobe consolidation/atelectasis. Electronically Signed   By: Corlis Leak M.D.   On: 11/30/2022 14:54    Procedures .Critical Care  Performed by: Ma Rings, PA-C Authorized by: Ma Rings, PA-C   Critical care provider statement:    Critical care time (minutes):  30   Critical care was necessary to treat  or prevent imminent or life-threatening deterioration of the following conditions:  Sepsis and respiratory failure   Critical care was time spent personally by me on the following activities:  Development of treatment plan with patient or surrogate, discussions with consultants, evaluation of patient's response to treatment, examination of patient, ordering and review of laboratory studies, ordering and review of radiographic studies, ordering and performing treatments and interventions, pulse oximetry, re-evaluation of patient's condition, review of old charts and obtaining history from patient or surrogate   Care discussed with: admitting provider       Medications Ordered in ED Medications  Chlorhexidine Gluconate Cloth 2 % PADS 6 each (has no administration in time range)  cefTRIAXone (ROCEPHIN) 2 g in sodium chloride 0.9 % 100 mL IVPB (has no administration in time range)  azithromycin (ZITHROMAX) 500 mg in sodium chloride 0.9 %  250 mL IVPB (has no administration in time range)  ipratropium-albuterol (DUONEB) 0.5-2.5 (3) MG/3ML nebulizer solution 3 mL (has no administration in time range)  methylPREDNISolone sodium succinate (SOLU-MEDROL) 40 mg/mL injection 40 mg (has no administration in time range)  vancomycin (VANCOREADY) IVPB 1500 mg/300 mL (1,500 mg Intravenous New Bag/Given 11/30/22 1841)  vancomycin (VANCOCIN) IVPB 1000 mg/200 mL premix (has no administration in time range)  pantoprazole (PROTONIX) injection 40 mg (has no administration in time range)  0.9 %  sodium chloride infusion (has no administration in time range)  morphine (PF) 4 MG/ML injection 4 mg (4 mg Intravenous Given 11/30/22 1436)  lactated ringers bolus 1,000 mL (0 mLs Intravenous Stopped 11/30/22 1629)  cefTRIAXone (ROCEPHIN) 1 g in sodium chloride 0.9 % 100 mL IVPB (0 g Intravenous Stopped 11/30/22 1618)  azithromycin (ZITHROMAX) 500 mg in sodium chloride 0.9 % 250 mL IVPB (0 mg Intravenous Stopped 11/30/22 1730)   lactated ringers bolus 1,000 mL (1,000 mLs Intravenous New Bag/Given 11/30/22 1627)  fentaNYL (SUBLIMAZE) injection 50 mcg (50 mcg Intravenous Given 11/30/22 1617)  ipratropium-albuterol (DUONEB) 0.5-2.5 (3) MG/3ML nebulizer solution 3 mL (3 mLs Nebulization Given 11/30/22 1637)  methylPREDNISolone sodium succinate (SOLU-MEDROL) 125 mg/2 mL injection 125 mg (125 mg Intravenous Given 11/30/22 1634)  ipratropium-albuterol (DUONEB) 0.5-2.5 (3) MG/3ML nebulizer solution 3 mL (3 mLs Nebulization Given 11/30/22 1714)  iohexol (OMNIPAQUE) 350 MG/ML injection 75 mL (75 mLs Intravenous Contrast Given 11/30/22 1739)    ED Course/ Medical Decision Making/ A&P                             Medical Decision Making This patient presents to the ED for concern of left flank pain, vomiting, this involves an extensive number of treatment options, and is a complaint that carries with it a high risk of complications and morbidity.  The differential diagnosis includes kidney stone, ureterolithiasis, Neri retention, pneumonia, pulmonary embolism, sepsis, other   Co morbidities that complicate the patient evaluation  COPD   Additional history obtained:  Additional history obtained from EMR External records from outside source obtained and reviewed including prior hospitalization for multifocal pneumonia   Lab Tests:  I Ordered, and personally interpreted labs.  The pertinent results include: CBC has normal white count but than 20% bands and 4% metamyelocytes Actiq acid elevated 2.8, urine was obtained via catheter due to his complaints of retention and this has no signs of infection, CMP shows mildly elevated BUN and creatinine, sodium slightly low at 131, chloride low at 94   Imaging Studies ordered:  I ordered imaging studies including chest x-ray I independently visualized and interpreted imaging which showed left lower lobe infiltrate I agree with the radiologist interpretation   Cardiac Monitoring: /  EKG:  The patient was maintained on a cardiac monitor.  I personally viewed and interpreted the cardiac monitored which showed an underlying rhythm of: Sinus rhythm   Consultations Obtained:  I requested consultation with the hospitalist,  and discussed lab and imaging findings as well as pertinent plan - they recommend: Admission  He also added on CTA of the chest to rule out PE due to significant pain and significant degree of hypoxia.  This showed bilateral pneumonia and no PE Problem List / ED Course / Critical interventions / Medication management  Sepsis due to pneumonia-patient came in initially complaining of urinary retention and vomiting with left flank pain, put in a catheter and got 300 cc of  clear urine, no UTI he is not febrile but was having abnormal wheezing, was noted to be hypoxic to 78% on room air.  He does have COPD but is not on oxygen normally. Chest x-ray was obtained and shows a left lower lobe lobe infiltrate.  Started on Antibiotics, given IV fluids due to his lactate of 2.8 and slightly low blood pressure.  Blood pressure came up to 120 systolic.  Not having significant pain so ordered a CT abdomen pelvis to rule out stone versus other acute intra-abdominal pathology, this just showed the lower lobe pneumonia Patient was still hypoxic despite nebulizers, steroids and supplemental O2, ABG ordered and requested of hospitalist and no pCO2 elevation, patient put on BiPAP for work of breathing and had improvement of his appearance and oxygen saturations.  Hospitalist evaluated patient is relocating here in the ED.  He did not meet sepsis criteria due to his hypoxia, tachypnea and greater than 20% bands I ordered medication including fentanyl for pain Reevaluation of the patient after these medicines showed that the patient improved I have reviewed the patients home medicines and have made adjustments as needed        Amount and/or Complexity of Data Reviewed Labs:  ordered. Decision-making details documented in ED Course. Radiology: ordered and independent interpretation performed. Decision-making details documented in ED Course.  Risk Prescription drug management. Decision regarding hospitalization.           Final Clinical Impression(s) / ED Diagnoses Final diagnoses:  Pneumonia of both lower lobes due to infectious organism  Sepsis with acute hypoxic respiratory failure without septic shock, due to unspecified organism [facility])    Rx / DC Orders ED Discharge Orders     None         Josem Kaufmann 11/30/22 Robb Matar, MD 11/30/22 2005

## 2022-11-30 NOTE — Progress Notes (Signed)
Patient transported from ED room 4 to ICU bed 5 on BIPAP. No adverse events noted.

## 2022-11-30 NOTE — ED Notes (Signed)
Gave report to [employer]

## 2022-11-30 NOTE — Progress Notes (Signed)
eLink Physician-Brief Progress Note Patient Name: Travis Beck DOB: 09-Mar-1946 MRN: [id number]   Date of Service  11/30/2022  HPI/Events of Note  77 year old male with a history of arthritis, asthma, type 2 diabetes mellitus and hypertension.  He initially presented to the emergency department with flank pain, dysuria and mild confusion [name] was found to be hypoxic and in a COPD exacerbation in the emergency department.  He was placed on BiPAP therapy and transported to the ICU for further management.  On presentation, he was tachypneic tachycardic, and mildly hypotensive.  He was saturating 86% on 6 L of oxygen.  He was placed on BiPAP therapy with improvement in oxygenation and work of breathing.  Initial ABG showed hypoxemia to pO2 50, metabolic panel with mildly elevated creatinine and bilirubin.  Lactic acidosis present CBC relatively unremarkable.  Urinalysis fairly unremarkable.  D-dimer was elevated but advanced testing was negative.  CT PE with no evidence of PE but bibasilar opacities consistent with pneumonia.  No evidence of urinary tract disease on imaging.  EKG with sinus rhythm, no obvious ischemic changes, QTc 470   eICU Interventions  Agree with maintaining BiPAP for now.    Broad-spectrum antibiotics seem appropriate.  Can likely de-escalate vancomycin if negative nasal MRSA.  Agree with scheduled SVNs and long-acting inhalers.  Maintain scheduled steroids.  Currently running continuous IV fluids, no clear indication for this.  Would favor a fluid restrictive approach in the absence of shock.  Has significant back pain at home, utilizes gabapentin and intermittent opiates (not on his profile) at home.  Given n.p.o. status and poor tolerance of removing BiPAP, will initiate low-dose Dilaudid sliding scale  GI prophylaxis with home pantoprazole.  DVT prophylaxis with enoxaparin.     Intervention Category Evaluation Type: New Patient Evaluation  [name] 11/30/2022, 8:20 PM

## 2022-11-30 NOTE — ED Notes (Signed)
Pt went to CT

## 2022-11-30 NOTE — Progress Notes (Signed)
Transported to CT and back to ED04 via BIPAP. No complications.

## 2022-11-30 NOTE — H&P (Addendum)
TRH H&P   Patient Demographics:    Travis Beck, is a 77 y.o. male  MRN: [id number]   DOB - July 31, 1945  Admit Date - 11/30/2022  Outpatient Primary MD for the patient is Kirstie Peri, MD  Referring MD/NP/PA: PA Cheleste  Patient coming from: home  Chief Complaint  Patient presents with   Altered Mental Status   Back Pain   Urinary Retention      HPI:    Travis Beck  is a 77 y.o. male, with medical history significant of arthritis, childhood asthma, type 2 diabetes mellitus and hypertension.    -Patient lives at home with his brother, sister at bedside assists with the history, presents to ED secondary to complaints of flank pain, dysuria, difficulty urination, mild confusion, upon presentation to ED he was noted to be with significant hypoxia and increased work of breathing, denies fever, chills at home, is with known history of COPD, but does not use any oxygen at home. -ED he was noted to be with severe wheezing, hypoxemia/hypoxia, for which she required 2 L oxygen via nasal cannula, then he was changed to BiPAP, CTA chest negative for PE, but significant for multifocal pneumonia.    Review of systems:    A full 10 point Review of Systems was done, except as stated above, all other Review of Systems were negative.   With Past History of the following :    Past Medical History:  Diagnosis Date   Arthritis    Asthma    Childhood   Emphysema lung (HCC)    Essential hypertension    Ischemic heart disease    Myoview 2020 indicating inferior infarct scar with mild peri-infarct ischemia - managed medically   Type 2 diabetes mellitus (HCC)       Past Surgical History:  Procedure Laterality Date   BIOPSY  08/30/2018   Procedure: BIOPSY;  Surgeon: Malissa Hippo, MD;  [facility];  Service: Endoscopy;;  gastric    BIOPSY  11/05/2020   Procedure: BIOPSY;   Surgeon: Dolores Frame, MD;  [facility]: [facility];  Service: Gastroenterology;;   COLONOSCOPY     COLONOSCOPY WITH PROPOFOL N/A 11/05/2020   Procedure: COLONOSCOPY WITH PROPOFOL;  Surgeon: Dolores Frame, MD;  [facility];  Service: Gastroenterology;  Laterality: N/A;  Am   ESOPHAGEAL DILATION N/A 05/21/2019   Procedure: ESOPHAGEAL DILATION;  Surgeon: Malissa Hippo, MD;  [facility];  Service: Endoscopy;  Laterality: N/A;   ESOPHAGOGASTRODUODENOSCOPY N/A 05/21/2019   Procedure: ESOPHAGOGASTRODUODENOSCOPY (EGD);  Surgeon: Malissa Hippo, MD;  [facility];  Service: Endoscopy;  Laterality: N/A;  730   ESOPHAGOGASTRODUODENOSCOPY (EGD) WITH PROPOFOL N/A 08/30/2018   Procedure: ESOPHAGOGASTRODUODENOSCOPY (EGD) WITH PROPOFOL;  Surgeon: Malissa Hippo, MD;  [facility];  Service: Endoscopy;  Laterality: N/A;  1:30   ESOPHAGOGASTRODUODENOSCOPY (EGD)  WITH PROPOFOL N/A 11/05/2020   Procedure: ESOPHAGOGASTRODUODENOSCOPY (EGD) WITH PROPOFOL;  Surgeon: Dolores Frame, MD;  [facility];  Service: Gastroenterology;  Laterality: N/A;   HERNIA REPAIR     bilateral lower abdomen   POLYPECTOMY  11/05/2020   Procedure: POLYPECTOMY;  Surgeon: Dolores Frame, MD;  [facility];  Service: Gastroenterology;;   Gaspar Bidding DILATION  11/05/2020   Procedure: Gaspar Bidding DILATION;  Surgeon: Marguerita Merles, Reuel Boom, MD;  [facility]: [facility];  Service: Gastroenterology;;      Social History:     Social History   Tobacco Use   Smoking status: Former    Packs/day: 2.00    Years: 33.00    Additional pack years: 0.00    Total pack years: 66.00    Types: Cigarettes    Quit date: 01/22/1993    Years since quitting: 29.8   Smokeless tobacco: Never  Substance Use Topics   Alcohol use: Not Currently       Family History :   Family history was reviewed, nonpertinent   Home Medications:   Prior  to Admission medications   Medication Sig Start Date End Date Taking? Authorizing Provider  acetaminophen (TYLENOL) 325 MG tablet Take 2 tablets (650 mg total) by mouth every 6 (six) hours as needed for mild pain (or Fever >/= 101). 11/26/19  Yes Emokpae, Courage, MD  albuterol (VENTOLIN HFA) 108 (90 Base) MCG/ACT inhaler Inhale 2 puffs into the lungs every 4 (four) hours as needed for wheezing or shortness of breath. 11/26/19  Yes Emokpae, Courage, MD  ALPRAZolam Prudy Feeler) 1 MG tablet Take 1 tablet (1 mg total) by mouth 3 (three) times daily as needed for anxiety. 01/28/19  Yes Welborn, Ryan, DO  amLODipine (NORVASC) 2.5 MG tablet Take 1 tablet (2.5 mg total) by mouth daily. For BP 11/26/19  Yes Emokpae, Courage, MD  buprenorphine (SUBUTEX) 8 MG SUBL SL tablet Place 8 mg under the tongue daily. 01/04/19  Yes [provider]  cetirizine (ZYRTEC) 10 MG tablet Take 10 mg by mouth as needed for allergies (itching).   Yes [provider]  fluticasone (FLONASE) 50 MCG/ACT nasal spray Place 2 sprays into both nostrils daily.  08/22/18  Yes [provider]  gabapentin (NEURONTIN) 400 MG capsule Take 1 capsule (400 mg total) by mouth every 8 (eight) hours as needed. 01/28/19  Yes Welborn, Ryan, DO  glyBURIDE (DIABETA) 5 MG tablet Take 10 mg by mouth 2 (two) times daily with a meal.  11/19/14  Yes [provider]  metFORMIN (GLUCOPHAGE) 1000 MG tablet Take 1,000 mg by mouth 2 (two) times daily with a meal.  12/07/14  Yes [provider]  montelukast (SINGULAIR) 10 MG tablet Take 1 tablet (10 mg total) by mouth at bedtime. 06/15/22  Yes Mannam, Praveen, MD  pantoprazole (PROTONIX) 40 MG tablet Take 1 tablet (40 mg total) by mouth daily. 12/15/20  Yes Dolores Frame, MD  valsartan (DIOVAN) 40 MG tablet Take 1 tablet (40 mg total) by mouth daily. For Heart and blood pressure 11/26/19  Yes Emokpae, Courage, MD  aspirin EC 81 MG tablet Take 1 tablet (81 mg total) by mouth  daily. Swallow whole. Patient not taking: Reported on 11/30/2022 09/30/20   Jonelle Sidle, MD  Fluticasone-Umeclidin-Vilant (TRELEGY ELLIPTA) 100-62.5-25 MCG/ACT AEPB Inhale 1 puff into the lungs daily. 06/15/22   Mannam, Colbert Coyer, MD  guaiFENesin (MUCINEX) 600 MG 12 hr tablet Take 1 tablet (600 mg total) by mouth 2 (two)  times daily. 11/26/19   Emokpae, Courage, MD  ipratropium-albuterol (DUONEB) 0.5-2.5 (3) MG/3ML SOLN Inhale 3 mLs into the lungs every 6 (six) hours as needed. Patient not taking: Reported on 11/30/2022    [provider]  levocetirizine (XYZAL) 5 MG tablet Take 1 tablet by mouth. occasionally 07/23/19   [provider]  polyethylene glycol (MIRALAX / GLYCOLAX) 17 g packet Take 17 g by mouth 2 (two) times daily. Patient not taking: Reported on 11/30/2022 11/26/19   Shon Hale, MD  rosuvastatin (CRESTOR) 20 MG tablet TAKE ONE TABLET BY MOUTH ONCE DAILY **PATIENT NEEDS APPOINTMENT FOR FUTURE REFILLS** 10/11/21   Jonelle Sidle, MD  TRELEGY ELLIPTA 100-62.5-25 MCG/INH AEPB inhale ONE PUFF into THE lungs DAILY 05/24/20   Mannam, Colbert Coyer, MD  triamcinolone cream (KENALOG) 0.1 % Apply 1 application topically daily as needed. 11/05/19   [provider]     Allergies:     Allergies  Allergen Reactions   Penicillins Rash    No problems with ampicillin during hospitalization     Physical Exam:   Vitals  Blood pressure (!) 98/50, pulse 92, temperature 97.8 F (36.6 C), temperature source Oral, resp. rate 18, height 5\' 5"  (1.651 m), weight 68 kg, SpO2 93 %.   1. General acutely ill-appearing male, in acute respiratory distress  2. Normal affect and insight, Not Suicidal or Homicidal, Awake Alert, Oriented X 3.  3. No F.N deficits, ALL C.Nerves Intact, Strength 5/5 all 4 extremities, Sensation intact all 4 extremities, Plantars down going.  4. Ears and Eyes appear Normal, Conjunctivae clear, PERRLA. Moist Oral Mucosa.  5. Supple Neck, No JVD,  No cervical lymphadenopathy appriciated, No Carotid Bruits.  6. Symmetrical Chest wall movement, Minister entry bilaterally, use of accessory muscles, tachypneic  7. RRR, No Gallops, Rubs or Murmurs, No Parasternal Heave.  8. Positive Bowel Sounds, Abdomen Soft, No tenderness, No organomegaly appriciated,No rebound -guarding or rigidity.  9.  No Cyanosis, Normal Skin Turgor, No Skin Rash or Bruise.  10. Good muscle tone,  joints appear normal , no effusions, Normal ROM.     Data Review:    CBC Recent Labs  Lab 11/30/22 1426  WBC 7.3  HGB 13.6  HCT 41.9  PLT 284  MCV 91.7  MCH 29.8  MCHC 32.5  RDW 12.6  LYMPHSABS 0.7  MONOABS 0.2  EOSABS 0.0  BASOSABS 0.1   ------------------------------------------------------------------------------------------------------------------  Chemistries  Recent Labs  Lab 11/30/22 1426  NA 131*  K 3.9  CL 94*  CO2 24  GLUCOSE 194*  BUN 24*  CREATININE 1.36*  CALCIUM 9.1  AST 18  ALT 15  ALKPHOS 59  BILITOT 1.5*   ------------------------------------------------------------------------------------------------------------------ estimated creatinine clearance is 38.9 mL/min (A) (by C-G formula based on SCr of 1.36 mg/dL (H)). ------------------------------------------------------------------------------------------------------------------ No results for input(s): "TSH", "T4TOTAL", "T3FREE", "THYROIDAB" in the last 72 hours.  Invalid input(s): "FREET3"  Coagulation profile No results for input(s): "INR", "PROTIME" in the last 168 hours. ------------------------------------------------------------------------------------------------------------------- Recent Labs    11/30/22 1426  DDIMER 2.73*   -------------------------------------------------------------------------------------------------------------------  Cardiac Enzymes No results for input(s): "CKMB", "TROPONINI", "MYOGLOBIN" in the last 168 hours.  Invalid  input(s): "CK" ------------------------------------------------------------------------------------------------------------------    Component Value Date/Time   BNP 211.0 (H) 11/24/2019 1235     ---------------------------------------------------------------------------------------------------------------  Urinalysis    Component Value Date/Time   COLORURINE YELLOW 11/30/2022 1426   APPEARANCEUR CLEAR 11/30/2022 1426   LABSPEC 1.012 11/30/2022 1426   PHURINE 7.0 11/30/2022 1426   GLUCOSEU NEGATIVE 11/30/2022 1426  GLUCOSEU >=1000 (A) 04/12/2022 1020   HGBUR NEGATIVE 11/30/2022 1426   BILIRUBINUR NEGATIVE 11/30/2022 1426   KETONESUR NEGATIVE 11/30/2022 1426   PROTEINUR NEGATIVE 11/30/2022 1426   UROBILINOGEN 0.2 04/12/2022 1020   NITRITE NEGATIVE 11/30/2022 1426   LEUKOCYTESUR NEGATIVE 11/30/2022 1426    ----------------------------------------------------------------------------------------------------------------   Imaging Results:    CT Angio Chest Pulmonary Embolism (PE) W or WO Contrast  Result Date: 11/30/2022 CLINICAL DATA:  Hypoxia EXAM: CT ANGIOGRAPHY CHEST WITH CONTRAST TECHNIQUE: Multidetector CT imaging of the chest was performed using the standard protocol during bolus administration of intravenous contrast. Multiplanar CT image reconstructions and MIPs were obtained to evaluate the vascular anatomy. RADIATION DOSE REDUCTION: This exam was performed according to the departmental dose-optimization program which includes automated exposure control, adjustment of the mA and/or kV according to patient size and/or use of iterative reconstruction technique. CONTRAST:  75mL OMNIPAQUE IOHEXOL 350 MG/ML SOLN COMPARISON:  09/23/2020 FINDINGS: Despite efforts by the technologist and patient, motion artifact is present on today's exam and could not be eliminated. This reduces exam sensitivity and specificity. Cardiovascular: No filling defect is identified in the pulmonary arterial  tree to suggest pulmonary embolus. Coronary, aortic arch, and branch vessel atherosclerotic vascular disease. Mediastinum/Nodes: 1.2 cm right hilar lymph node. 1.0 cm left infrahilar lymph node. 1.2 cm subcarinal lymph node. Small type 1 hiatal hernia.  Mildly dilated distal esophagus. Lungs/Pleura: Consolidation of most of the left lower lobe aside from the superior segment. Substantial airspace opacity posteriorly in the right lower lobe, especially in the posterior basal segment. Appearance suspicious for multilobar pneumonia or aspiration pneumonitis. Patchy reticulonodular opacities are present in both upper lobes along with some bandlike atelectasis at the lung apices. Benign 3 mm calcified granuloma noted in the left upper lobe on image 66 series 6. Upper Abdomen: Abdominal aortic atherosclerosis. Separate origin of the splenic artery from the rest of the celiac trunk; atheromatous calcification at the origins of the upper abdominal aortic branches without appreciable occlusion. Musculoskeletal: Sternoclavicular arthropathy bilaterally. Review of the MIP images confirms the above findings. IMPRESSION: 1. No filling defect is identified in the pulmonary arterial tree to suggest pulmonary embolus. To motion artifact. Reduced sensitivity due 2. Consolidation of most of the left lower lobe aside from the superior segment. Substantial airspace opacity posteriorly in the right lower lobe, especially in the posterior basal segment. Appearance suspicious for multilobar pneumonia or aspiration pneumonitis. 3. Patchy reticulonodular opacities are present in both upper lobes along with some bandlike atelectasis at the lung apices. 4. Mild bilateral hilar and subcarinal adenopathy, probably reactive. 5. Small type 1 hiatal hernia. Mildly dilated distal esophagus. 6. Aortic atherosclerosis. Aortic Atherosclerosis (ICD10-I70.0). Electronically Signed   By: Gaylyn Rong M.D.   On: 11/30/2022 18:00   CT Renal Stone  Study  Result Date: 11/30/2022 CLINICAL DATA:  Abdominal/flank pain, stone suspected.  Weakening EXAM: CT ABDOMEN AND PELVIS WITHOUT CONTRAST TECHNIQUE: Multidetector CT imaging of the abdomen and pelvis was performed following the standard protocol without IV contrast. RADIATION DOSE REDUCTION: This exam was performed according to the departmental dose-optimization program which includes automated exposure control, adjustment of the mA and/or kV according to patient size and/or use of iterative reconstruction technique. COMPARISON:  Chest radiograph performed earlier on the same date. FINDINGS: Lower chest: Bilateral lower lobe consolidations left greater than the right with air bronchograms concerning for bilateral pneumonia. Prominent atherosclerotic calcification of coronary arteries. Hepatobiliary: No focal liver abnormality is seen. No gallstones, gallbladder wall thickening, or biliary dilatation.  Pancreas: Generalized pancreatic atrophy no pancreatic ductal dilatation or surrounding inflammatory changes. Spleen: Normal in size without focal abnormality. Adrenals/Urinary Tract: Adrenal glands are unremarkable. Kidneys are normal, without renal calculi, focal lesion, or hydronephrosis. Exophytic bilateral renal cysts which contains mildly complex fluid. The interpolar right renal cyst measures a proximally 2.2 x 2.7 cm and in the lower pole of the left kidney measures a proximally 2.4 x 2.3 cm. Foley's catheter in the urinary bladder with trace amount of air likely iatrogenic. Stomach/Bowel: Stomach is within normal limits. Appendix appears normal. No evidence of bowel wall thickening, distention, or inflammatory changes. Sigmoid colonic diverticulosis without evidence of acute diverticulitis. Vascular/Lymphatic: Aortic atherosclerosis. No enlarged abdominal or pelvic lymph nodes. Reproductive: Prostate is unremarkable. Other: No abdominal wall hernia or abnormality. No abdominopelvic ascites.  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 No acute osseous abnormality. IMPRESSION: 1. Bilateral lower lobe consolidations left greater than the right with air bronchograms concerning for bilateral pneumonia. 2. No evidence of nephrolithiasis or hydronephrosis. 3. Sigmoid colonic diverticulosis without evidence of acute diverticulitis. 4. Aortic atherosclerosis and coronary artery calcification. 5. 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 No acute osseous abnormality. Aortic Atherosclerosis (ICD10-I70.0). Electronically Signed   By: Larose Hires D.O.   On: 11/30/2022 16:19   DG Chest Port 1 View  Result Date: 11/30/2022 CLINICAL DATA:  hypoxia EXAM: PORTABLE CHEST - 1 VIEW COMPARISON:  08/08/2022 FINDINGS: Relatively low lung volumes. New consolidation/atelectasis at the left lung base obscuring the diaphragmatic leaflet. Mild scattered perihilar of the lower opacities left greater than right. Heart size and mediastinal contours are within normal limits. No effusion. Visualized bones unremarkable. IMPRESSION: New left lower lobe consolidation/atelectasis. Electronically Signed   By: Corlis Leak M.D.   On: 11/30/2022 14:54     EKG:  Vent. rate 99 BPM PR interval 162 ms QRS duration 98 ms QT/QTcB 368/473 ms P-R-T axes 66 6 -20 Sinus rhythm Minimal ST depression, anterior leads  Assessment & Plan:    Principal Problem:   Acute respiratory failure with hypoxia (HCC) Active Problems:   GERD (gastroesophageal reflux disease)   Acute exacerbation of COPD with asthma (HCC)   Anemia, unspecified   Multifocal pneumonia   Dysuria    Hypoxemic respiratory failure Severe sepsis  due to multifocal pneumonia, POA COPD exacerbation -Patient presents with significant respiratory distress, severe hypoxemia, increased work of breathing. -Improving on BiPAP. -CTA chest ruled out  PE, but significant for multifocal pneumonia -Blood cultures, sputum culture, Legionella antigen, strep pneumonia antigen -Encouraged use incentive spirometry, flutter valve, will start on chest vest as well -Will keep on IV Rocephin, and azithromycin, will start on vancomycin this can be discontinued if MRSA PCR is negative -Nephric and wheezing initially, continue with the scheduled DuoNebs, Breo Ellipta and will start on IV Solu-Medrol -Will be admitted to ICU - trend lactic acid -Follow-up on respiratory viral panel, he is negative COVID-19  Hyponatremia -Will start on IV NS  Dysuria/right flank pain -No clear etiology, his UA is negative, as well CT protocol with no hydronephrosis or nephrolithiasis  Diabetes mellitus -Hold home medication and keep an insulin sliding scale during hospital stay  Hypertension -Hold home medications and keep on as needed hydralazine   AKI -  due to sepsis, continue with IV fluids, avoid hypotension  Chronic pain syndrome -Resume home Xanax, gabapentin and buprenorphine when more stable   DVT Prophylaxis -  Lovenox  AM Labs Ordered, also please review Full Orders  Family Communication: Admission, patients condition and plan of care including tests being ordered have been discussed with the patient and sister at bedside who indicate understanding and agree with the plan and Code Status.  Code Status full  Likely DC to  home  Condition GUARDED    Consults called: none    Admission status: inpatient    Time spent in minutes : 70 minutes  The patient is critically ill with multi-organ failure.  Critical care was necessary to treat or prevent imminent or life-threatening deterioration of severe sepsis, acute hypoxemic respiratory failure, cardiac failure, acute renal failure and was exclusive of separately billable procedures and treating other patients. Total critical care time spent by me: 70 minutes Time spent personally by me on  obtaining history from patient or surrogate, evaluation of the patient, evaluation of patient's response to treatment, ordering and review of laboratory studies, ordering and review of radiographic studies, ordering and performing treatments and interventions, and re-evaluation of the patient's condition.  Huey Bienenstock M.D on 11/30/2022 at 7:20 PM   [facility] - Office  [phone]

## 2022-11-30 NOTE — Progress Notes (Signed)
Elink is following code sepsis 

## 2022-11-30 NOTE — Progress Notes (Signed)
CRITICAL VALUE STICKER  CRITICAL VALUE: Lactic Acid 2.3   RECEIVER (on-site recipient of call): Stephens Shire, RN  DATE & TIME NOTIFIED:   MESSENGER (representative from lab):  MD NOTIFIED: Dr. Carren Rang  TIME OF NOTIFICATION:2140  RESPONSE:  awaiting

## 2022-12-01 ENCOUNTER — Inpatient Hospital Stay (HOSPITAL_COMMUNITY): Payer: Medicare Other

## 2022-12-01 ENCOUNTER — Other Ambulatory Visit (HOSPITAL_COMMUNITY): Payer: Medicare Other

## 2022-12-01 ENCOUNTER — Other Ambulatory Visit (HOSPITAL_COMMUNITY): Payer: Self-pay | Admitting: *Deleted

## 2022-12-01 DIAGNOSIS — I339 Acute and subacute endocarditis, unspecified: Secondary | ICD-10-CM

## 2022-12-01 DIAGNOSIS — J9601 Acute respiratory failure with hypoxia: Secondary | ICD-10-CM | POA: Diagnosis not present

## 2022-12-01 LAB — GLUCOSE, CAPILLARY
Glucose-Capillary: 135 mg/dL — ABNORMAL HIGH (ref 70–99)
Glucose-Capillary: 137 mg/dL — ABNORMAL HIGH (ref 70–99)
Glucose-Capillary: 149 mg/dL — ABNORMAL HIGH (ref 70–99)
Glucose-Capillary: 159 mg/dL — ABNORMAL HIGH (ref 70–99)
Glucose-Capillary: 198 mg/dL — ABNORMAL HIGH (ref 70–99)
Glucose-Capillary: 205 mg/dL — ABNORMAL HIGH (ref 70–99)
Glucose-Capillary: 213 mg/dL — ABNORMAL HIGH (ref 70–99)

## 2022-12-01 LAB — CBC
HCT: 38.6 % — ABNORMAL LOW (ref 39.0–52.0)
Hemoglobin: 12.8 g/dL — ABNORMAL LOW (ref 13.0–17.0)
MCH: 30.3 pg (ref 26.0–34.0)
MCHC: 33.2 g/dL (ref 30.0–36.0)
MCV: 91.5 fL (ref 80.0–100.0)
Platelets: 247 10*3/uL (ref 150–400)
RBC: 4.22 MIL/uL (ref 4.22–5.81)
RDW: 12.9 % (ref 11.5–15.5)
WBC: 7.7 10*3/uL (ref 4.0–10.5)
nRBC: 0 % (ref 0.0–0.2)

## 2022-12-01 LAB — BLOOD GAS, ARTERIAL
Acid-base deficit: 1.9 mmol/L (ref 0.0–2.0)
Bicarbonate: 22.2 mmol/L (ref 20.0–28.0)
Drawn by: 21310
FIO2: 60 %
O2 Saturation: 99.3 %
Patient temperature: 36.8
pCO2 arterial: 35 mmHg (ref 32–48)
pH, Arterial: 7.41 (ref 7.35–7.45)
pO2, Arterial: 133 mmHg — ABNORMAL HIGH (ref 83–108)

## 2022-12-01 LAB — EXPECTORATED SPUTUM ASSESSMENT W GRAM STAIN, RFLX TO RESP C

## 2022-12-01 LAB — STREP PNEUMONIAE URINARY ANTIGEN: Strep Pneumo Urinary Antigen: NEGATIVE

## 2022-12-01 LAB — BASIC METABOLIC PANEL
Anion gap: 11 (ref 5–15)
BUN: 22 mg/dL (ref 8–23)
CO2: 19 mmol/L — ABNORMAL LOW (ref 22–32)
Calcium: 8.3 mg/dL — ABNORMAL LOW (ref 8.9–10.3)
Chloride: 105 mmol/L (ref 98–111)
Creatinine, Ser: 1.14 mg/dL (ref 0.61–1.24)
GFR, Estimated: >60 mL/min (ref >60)
Glucose, Bld: 227 mg/dL — ABNORMAL HIGH (ref 70–99)
Potassium: 3.9 mmol/L (ref 3.5–5.1)
Sodium: 135 mmol/L (ref 135–145)

## 2022-12-01 LAB — ECHOCARDIOGRAM COMPLETE
AR max vel: 2.27 cm2
AV Peak grad: 7.3 mmHg
Ao pk vel: 1.35 m/s
Area-P 1/2: 3.85 cm2
Height: 65 in
S' Lateral: 3.2 cm
Weight: 2264.57 oz

## 2022-12-01 LAB — HIV ANTIBODY (ROUTINE TESTING W REFLEX): HIV Screen 4th Generation wRfx: NONREACTIVE

## 2022-12-01 LAB — LACTIC ACID, PLASMA
Lactic Acid, Venous: 2.2 mmol/L (ref 0.5–1.9)
Lactic Acid, Venous: 3 mmol/L (ref 0.5–1.9)
Lactic Acid, Venous: 3.4 mmol/L (ref 0.5–1.9)

## 2022-12-01 LAB — PROCALCITONIN: Procalcitonin: 7.38 ng/mL

## 2022-12-01 LAB — MRSA NEXT GEN BY PCR, NASAL: MRSA by PCR Next Gen: DETECTED — AB

## 2022-12-01 LAB — CULTURE, BLOOD (ROUTINE X 2)

## 2022-12-01 MED ORDER — INSULIN ASPART 100 UNIT/ML IJ SOLN: 0.0000 [IU] | Freq: Every day | INTRAMUSCULAR | Status: DC

## 2022-12-01 MED ORDER — ENSURE ENLIVE PO LIQD
237.0000 mL | Freq: Two times a day (BID) | ORAL | Status: DC
  Administered 2022-12-01: 237 mL via ORAL

## 2022-12-01 MED ORDER — LACTATED RINGERS IV SOLN: INTRAVENOUS | Status: AC

## 2022-12-01 MED ORDER — HYDROXYZINE HCL 25 MG PO TABS: 50.0000 mg | ORAL_TABLET | Freq: Once | ORAL | Status: DC

## 2022-12-01 MED ORDER — RISAQUAD PO CAPS
1.0000 | ORAL_CAPSULE | Freq: Two times a day (BID) | ORAL | Status: DC
  Administered 2022-12-01 – 2022-12-03 (×5): 1 via ORAL
  Filled 2022-12-01 (×6): qty 1

## 2022-12-01 MED ORDER — MUPIROCIN 2 % EX OINT
1.0000 | TOPICAL_OINTMENT | Freq: Two times a day (BID) | CUTANEOUS | Status: DC
  Administered 2022-12-01 – 2022-12-03 (×5): 1 via NASAL
  Filled 2022-12-01 (×5): qty 22

## 2022-12-01 MED ORDER — INSULIN ASPART 100 UNIT/ML IJ SOLN
0.0000 [IU] | INTRAMUSCULAR | Status: DC
Start: 2022-12-01 — End: 2022-12-01

## 2022-12-01 MED ORDER — LORAZEPAM 2 MG/ML IJ SOLN
1.0000 mg | Freq: Once | INTRAMUSCULAR | Status: AC
  Administered 2022-12-01: 1 mg via INTRAVENOUS
  Filled 2022-12-01: qty 1

## 2022-12-01 MED ORDER — ARFORMOTEROL TARTRATE 15 MCG/2ML IN NEBU
15.0000 ug | INHALATION_SOLUTION | Freq: Two times a day (BID) | RESPIRATORY_TRACT | Status: DC
  Administered 2022-12-01 – 2022-12-03 (×4): 15 ug via RESPIRATORY_TRACT
  Filled 2022-12-01 (×6): qty 2

## 2022-12-01 MED ORDER — BUDESONIDE 0.25 MG/2ML IN SUSP
0.2500 mg | Freq: Two times a day (BID) | RESPIRATORY_TRACT | Status: DC
  Administered 2022-12-01 – 2022-12-03 (×4): 0.25 mg via RESPIRATORY_TRACT
  Filled 2022-12-01 (×5): qty 2

## 2022-12-01 MED ORDER — REVEFENACIN 175 MCG/3ML IN SOLN
175.0000 ug | Freq: Every day | RESPIRATORY_TRACT | Status: DC
  Administered 2022-12-02 – 2022-12-03 (×2): 175 ug via RESPIRATORY_TRACT
  Filled 2022-12-01 (×3): qty 3

## 2022-12-01 MED ORDER — INSULIN ASPART 100 UNIT/ML IJ SOLN
0.0000 [IU] | Freq: Three times a day (TID) | INTRAMUSCULAR | Status: DC
  Administered 2022-12-01: 3 [IU] via SUBCUTANEOUS
  Administered 2022-12-02: 4 [IU] via SUBCUTANEOUS
  Administered 2022-12-02: 11 [IU] via SUBCUTANEOUS
  Administered 2022-12-02: 4 [IU] via SUBCUTANEOUS
  Administered 2022-12-03: 11 [IU] via SUBCUTANEOUS
  Administered 2022-12-03: 20 [IU] via SUBCUTANEOUS

## 2022-12-01 MED ORDER — ALPRAZOLAM 0.5 MG PO TABS
1.0000 mg | ORAL_TABLET | Freq: Three times a day (TID) | ORAL | Status: DC | PRN
  Administered 2022-12-01 – 2022-12-03 (×3): 1 mg via ORAL
  Filled 2022-12-01 (×3): qty 2

## 2022-12-01 MED ORDER — LACTATED RINGERS IV BOLUS
1000.0000 mL | Freq: Once | INTRAVENOUS | Status: AC
  Administered 2022-12-01: 1000 mL via INTRAVENOUS

## 2022-12-01 MED ORDER — LIP MEDEX EX OINT: TOPICAL_OINTMENT | CUTANEOUS | Status: DC | PRN

## 2022-12-01 MED ORDER — IPRATROPIUM-ALBUTEROL 0.5-2.5 (3) MG/3ML IN SOLN
3.0000 mL | RESPIRATORY_TRACT | Status: DC | PRN
  Administered 2022-12-03: 3 mL via RESPIRATORY_TRACT
  Filled 2022-12-01: qty 3

## 2022-12-01 MED ORDER — BUPRENORPHINE HCL 8 MG SL SUBL
8.0000 mg | SUBLINGUAL_TABLET | Freq: Every day | SUBLINGUAL | Status: DC
  Administered 2022-12-01 – 2022-12-03 (×3): 8 mg via SUBLINGUAL
  Filled 2022-12-01 (×3): qty 1

## 2022-12-01 MED ORDER — CHLORHEXIDINE GLUCONATE CLOTH 2 % EX PADS: 6.0000 | MEDICATED_PAD | Freq: Every day | CUTANEOUS | Status: DC

## 2022-12-01 MED ORDER — INSULIN ASPART 100 UNIT/ML IJ SOLN
0.0000 [IU] | INTRAMUSCULAR | Status: DC
  Administered 2022-12-01: 2 [IU] via SUBCUTANEOUS
  Administered 2022-12-01: 5 [IU] via SUBCUTANEOUS

## 2022-12-01 MED ORDER — PANTOPRAZOLE SODIUM 40 MG PO TBEC
40.0000 mg | DELAYED_RELEASE_TABLET | Freq: Every day | ORAL | Status: DC
  Administered 2022-12-01 – 2022-12-02 (×2): 40 mg via ORAL
  Filled 2022-12-01 (×2): qty 1

## 2022-12-01 NOTE — Hospital Course (Addendum)
Travis Beck  is a 77 y.o. male, with medical history significant of arthritis, childhood asthma, type 2 diabetes mellitus and hypertension.    -Patient lives at home with his brother, sister at bedside assists with the history, presents to ED secondary to complaints of flank pain, dysuria, difficulty urination, mild confusion, upon presentation to ED he was noted to be with significant hypoxia and increased work of breathing, denies fever, chills at home, is with known history of COPD, but does not use any oxygen at home.  ED: He was noted to be with severe wheezing, hypoxemia/hypoxia, for which she required 2 L oxygen via nasal cannula, then he was changed to BiPAP, CTA chest: negative for PE, but significant for multifocal pneumonia.   Assessment & Plan:     Principal Problem:   Acute respiratory failure with hypoxia (HCC) Active Problems:   GERD (gastroesophageal reflux disease)   Acute exacerbation of COPD with asthma (HCC)   Anemia, unspecified   Multifocal pneumonia   Dysuria       Hypoxemic respiratory failure Severe sepsis  due to multifocal pneumonia, POA COPD exacerbation  -Remain in respiratory failure-BiPAP ABG    Component Value Date/Time   PHART 7.41 12/01/2022 0745   PCO2ART 35 12/01/2022 0745   PO2ART 133 (H) 12/01/2022 0745   HCO3 22.2 12/01/2022 0745   ACIDBASEDEF 1.9 12/01/2022 0745   O2SAT 99.3 12/01/2022 0745    -Patient presents with significant respiratory distress, severe hypoxemia, increased work of breathing. -Improving on BiPAP. -CTA chest ruled out PE, but significant for multifocal pneumonia -Blood cultures, sputum culture, Legionella antigen, strep pneumonia antigen -Encouraged use incentive spirometry, flutter valve, will start on chest vest as well -on IV Rocephin, and azithromycin, will start on vancomycin this can be discontinued if MRSA PCR is negative -wheezing-  continue with the scheduled DuoNebs, Breo Ellipta and will start on IV  Solu-Medrol - - Trending - lactic acid  2.3 >> 2.2 >>3.0 - negative COVID-19    Consulted PCCM Dr. Rhodia Albright -the case in detail As patient is moving around direction, agreed to move the patient to ICU at [facility] today 12/01/2022    Hypotensive  Likely due to sepsis, continue Levophed  H/o hypertension -Hold home medications and keep on as needed hydralazine   Hyponatremia -Will start on IV NS   Dysuria/right flank pain -No clear etiology, his UA is negative, as well CT protocol with no hydronephrosis or nephrolithiasis   Diabetes mellitus -Hold home medication and keep an insulin sliding scale during hospital stay      AKI -  due to sepsis, continue with IV fluids, avoid hypotension   Chronic pain syndrome -Resume home Xanax, gabapentin and buprenorphine when more stable

## 2022-12-01 NOTE — Progress Notes (Signed)
PCCM attending:  This is a 77 year old gentleman presented to [facility] panel hospital with back pain flank pain, dyspnea shortness of breath.  Has a past medical history of hypertension hyperlipidemia COPD evidence of emphysema.  Type 2 diabetes gastroesophageal reflux.  He was found to have a left lower lobe pneumonia on CT imaging.  He has had elevated lactate and was hypoxemic.  He was briefly hypotensive systolics in the 90s.  He was placed on Levophed.  Given fluid resuscitation was on BiPAP briefly.  Patient was transferred to the medical intensive care unit here at [facility].  BP (!) 144/85   Pulse 98   Temp 98.6 F (37 C) (Oral)   Resp 19   Ht 5\' 5"  (1.651 m)   Wt 64.2 kg   SpO2 92%   BMI 23.55 kg/m   General: Elderly gentleman, resting comfortably in bed off pressors. HEENT: NCAT tracking appropriately Heart: Regular rhythm S1-S2 Lungs: Rhonchi bilaterally Abdomen: Soft nontender nondistended  Labs: Reviewed CT imaging: Reviewed  Assessment: Septic shock secondary to multilobar pneumonia, hypotension improved off pressors Left lower lobe worse than right Acute hypoxemic respiratory failure secondary to above COPD with emphysema Former smoker Hypertension Type 2 diabetes Back pain which I suspect is potentially pleuritic pain from his pneumonia.  Plan: Respiratory cultures pending, Legionella and strep urine antigen pending. Off pressors at this time Echo pending Responded to fluid resuscitation Advance diet as tolerated Continue ceftriaxone plus azithromycin plus vancomycin, nasal PCR positive for MRSA. Continue bronchodilators via nebs with Brovana Pulmicort Yupelri I-S and flutter valve Since he is no longer requiring NIPPV or pressors I think he is probably stable for pickup TRH tomorrow.  Full H&P note by Cloyd Stagers, PA pending   Josephine Igo, DO [facility] 12/01/2022 1:46 PM

## 2022-12-01 NOTE — Plan of Care (Signed)
   PCCM transfer request    Sending physician: Dr. Jola Schmidt  Sending facility: [facility]  Reason for transfer: Pneumonia   Brief case summary: Septic, pneumonia, on BIPAP, PaO2 133, shock on levophed, Lactic acid 3 despite resuscitation  Recommendations made prior to transfer: Add vasopressin if needed, examine abdomen, or image abdomen to r/o bowel ischemia, ECHO can be completed there or here pending arrival time   Transfer accepted: yes/no Yes   Travis Beck 12/01/22 9:25 AM [facility]  For contact information, see Amion. If no response to pager, please call PCCM consult pager. After hours, 7PM- 7AM, please call Elink.

## 2022-12-01 NOTE — H&P (Signed)
NAME:  Travis Beck, MRN:  [id number], DOB:  [birth year], LOS: 1 ADMISSION DATE:  11/30/2022 CONSULTATION DATE:  12/01/2022 REFERRING MD: Flossie Dibble - [facility] CHIEF COMPLAINT:  Sepsis, PNA   History of Present Illness:  77 year old man who presented to [facility] ED 5/23 with flank pain/dysuria, AMS and SOB/hypoxia. PMHx significant for HTN, HLD, COPD with emphysema (prior smoker, follows with Dr. Isaiah Serge at [facility]), T2DM, GERD.   Patient presented to [facility] ED 5/23 with 1 to 2-week history of increasing shortness of breath and back pain. Hasn't been feeling well for a few weeks.  Notes he has lost around 15 pounds over the span of 5 months.  His appetite is marginal, but he does take in at least 2 ensures per day along with a small meal.  He lives at home with his brother and is independent with all ADLs at baseline.  Follows with Dr. Isaiah Serge, currently on Trelegy but utilizing his rescue inhaler/nebulizers almost daily.  He also reports back pain which is primarily left-sided and worse with deep breaths.  Denies fever/chills, chest pain.  Denies nausea/vomiting/diarrhea or changes in bowel or urinary habits, denies LE edema or orthopnea.  On ED arrival, patient was afebrile, HR 92, mildly hypotensive 98/50, RR 18, SpO2 93%.  Was reportedly as low as 82% on room air with accessory muscle use.  Labs were notable for WBC 7.3, Hgb 13.6, PLT 284; NA 131, K3.9, CO2 24, BUN/Cr 24/1.36 (baseline 0.8), transaminases within normal limits, alk phos 59, T. bili 1.5.  LA elevated at 2.8 > 2.3 > 2.2 > 3.0 > 3.4 despite fluid resuscitation.  Urine strep was negative, Legionella pending.  MRSA PCR positive. Patient remained hypotensive requiring peripheral Levophed start and BiPAP for respiratory support.  PCCM consulted for [facility] transfer.  Pertinent Medical History:   Past Medical History:  Diagnosis Date   Arthritis    Asthma    Childhood   Emphysema lung (HCC)    Essential hypertension    Ischemic heart disease     Myoview 2020 indicating inferior infarct scar with mild peri-infarct ischemia - managed medically   Type 2 diabetes mellitus (HCC)    Significant Hospital Events: Including procedures, antibiotic start and stop dates in addition to other pertinent events   5/23 - Presented to [facility] ED for flank pain/dysuria, AMS and SOB/hypoxia; CXR with new LLL consolidation/atelectasis; CTA Chest showed consolidation of LLL with substantial airspace opacity in the RLL concerning for multilobar pneumonia versus aspiration; negative for PE.  Initially concern for septic shock with hypotension requiring peripheral Levophed. 5/24 - Persistent shock with hypotension and worsening infiltrates on CXR, required BiPAP.  Transferred to [facility] for higher level of care.   Interim History / Subjective:  Transferred from [facility] to [facility] for higher level of care Overall better appearing in person than on paper Off of Levophed, on 2L [place] on my exam SpO2 hovering around 90, history of COPD Mild desaturation with significant conversation Complaining of left-sided back pain, query if this is pleuritic versus musculoskeletal  Objective:  Blood pressure (!) 144/85, pulse 98, temperature 98.6 F (37 C), temperature source Oral, resp. rate 19, height 5\' 5"  (1.651 m), weight 64.2 kg, SpO2 92 %.    FiO2 (%):  [50 %-60 %] 50 % PEEP:  [5 cmH20] 5 cmH20   Intake/Output Summary (Last 24 hours) at 12/01/2022 1340 Last data filed at 12/01/2022 1103 Gross per 24 hour  Intake 1996.18 ml  Output 3175 ml  Net -1178.82 ml  Filed Weights   11/30/22 1403 11/30/22 2015  Weight: 68 kg 64.2 kg   Physical Examination: General: Acutely ill-appearing elderly man in NAD. Pleasant and talkative. HEENT: [place]/AT, anicteric sclera, PERRL, dry mucous membranes. Neuro: Awake, oriented x 4. Responds to verbal stimuli. Following commands consistently. Moves all 4 extremities spontaneously.  No focal neurologic deficits. CV: RRR, no m/g/r. PULM: Breathing  even and minimally labored on 2LNC. Lung fields with coarse rhonchi throughout, L > R. GI: Soft, mild diffuse TTP, nondistended. Normoactive bowel sounds. Extremities: No LE edema noted. Skin: Warm/dry, no rashes.  Resolved Hospital Problem List:    Assessment & Plan:  Presumed septic shock in the setting of multilobar PNA, improving Multilobar PNA, LLL/RLL - Transferred to [facility] from [facility] ICU for further care - Goal MAP > 65 - Fluid resuscitation as tolerated, appears dry on exam - Peripheral Levophed titrated to goal MAP, off of pressors on arrival to [facility] - Midodrine discontinued - F/u Echo, ensure no cardiac dysfunction - Trend WBC, fever curve, LA - F/u Cx data (Resp Cx, BCx), legionella; urine strep negative - Continue broad-spectrum antibiotics (ceftriaxone, azithromycin, vanc as nasal MRSA PCR +)  Acute hypoxemic respiratory failure COPD with emphysema, followed by Dr. Isaiah Serge as an outpatient Former smoker CTA Chest 5/23 consolidation of LLL with substantial airspace opacity in the RLL concerning for multilobar pneumonia versus aspiration; negative for PE. Patchy reticulonodular opacities in LUL/RUL, reactive hilar LAD. - Continue supplemental O2 support - BiPAP PRN - Wean O2 for sat 88-92% - Continue steroids - Bronchodilators (Brovana/Yupelri, Pulmicort, DuoNebs PRN) - Continue Singulair - Pulmonary hygiene (IS/flutter, chest PT, OOB/mobilize as able) - Will need outpatient pulmonary f/u with Dr. Isaiah Serge ([facility])  HTN HLD - Hold home antihypertensives for now - Add back slowly as clinically appropriate  Back pain, L-sided - pleuritic versus MSK, suspect this is more likely r/t PNA CT Renal Stone Protocol negative for nephrolithiasis/hydronephrosis; +colonic diverticulosis without diverticulitis, aortic atherosclerosis, multi-level DDD. - Continue home buprenorphine - Dilaudid PRN for now, resume home gabapentin as appropriate  T2DM - SSI - CBGs ACHS - Goal CBG  140-180  Anxiety - Continue home Xanax PRN  At risk for malnutrition - Continue Ensure supplements (patient utilizes these at home)  Best Practice: (right click and "Reselect all SmartList Selections" daily)   Diet/type: Regular consistency (see orders) DVT prophylaxis: SCDs, SQH GI prophylaxis: PPI Lines: N/A Foley:  N/A Code Status:  full code Last date of multidisciplinary goals of care discussion [Pending]  Labs:  CBC: Recent Labs  Lab 11/30/22 1426 12/01/22 0541  WBC 7.3 7.7  NEUTROABS 6.0  --   HGB 13.6 12.8*  HCT 41.9 38.6*  MCV 91.7 91.5  PLT 284 247   Basic Metabolic Panel: Recent Labs  Lab 11/30/22 1426 12/01/22 0541  NA 131* 135  K 3.9 3.9  CL 94* 105  CO2 24 19*  GLUCOSE 194* 227*  BUN 24* 22  CREATININE 1.36* 1.14  CALCIUM 9.1 8.3*   GFR: Estimated Creatinine Clearance: 46.5 mL/min (by C-G formula based on SCr of 1.14 mg/dL). Recent Labs  Lab 11/30/22 1426 11/30/22 2057 11/30/22 2335 12/01/22 0541 12/01/22 0932  PROCALCITON  --   --   --  7.38  --   WBC 7.3  --   --  7.7  --   LATICACIDVEN 2.8* 2.3* 2.2* 3.0* 3.4*   Liver Function Tests: Recent Labs  Lab 11/30/22 1426  AST 18  ALT 15  ALKPHOS 59  BILITOT  1.5*  PROT 7.7  ALBUMIN 4.3   No results for input(s): "LIPASE", "AMYLASE" in the last 168 hours. No results for input(s): "AMMONIA" in the last 168 hours.  ABG:    Component Value Date/Time   PHART 7.41 12/01/2022 0745   PCO2ART 35 12/01/2022 0745   PO2ART 133 (H) 12/01/2022 0745   HCO3 22.2 12/01/2022 0745   ACIDBASEDEF 1.9 12/01/2022 0745   O2SAT 99.3 12/01/2022 0745    Coagulation Profile: No results for input(s): "INR", "PROTIME" in the last 168 hours.  Cardiac Enzymes: No results for input(s): "CKTOTAL", "CKMB", "CKMBINDEX", "TROPONINI" in the last 168 hours.  HbA1C: No results found for: "HGBA1C"  CBG: Recent Labs  Lab 11/30/22 2010 12/01/22 0022 12/01/22 0432 12/01/22 0753 12/01/22 1148  GLUCAP  172* 198* 213* 205* 149*   Review of Systems:   Review of systems completed with pertinent positives/negatives outlined in above HPI.  Past Medical History:  He,  has a past medical history of Arthritis, Asthma, Emphysema lung (HCC), Essential hypertension, Ischemic heart disease, and Type 2 diabetes mellitus (HCC).   Surgical History:   Past Surgical History:  Procedure Laterality Date   BIOPSY  08/30/2018   Procedure: BIOPSY;  Surgeon: Malissa Hippo, MD;  [facility];  Service: Endoscopy;;  gastric    BIOPSY  11/05/2020   Procedure: BIOPSY;  Surgeon: Dolores Frame, MD;  [facility];  Service: Gastroenterology;;   COLONOSCOPY     COLONOSCOPY WITH PROPOFOL N/A 11/05/2020   Procedure: COLONOSCOPY WITH PROPOFOL;  Surgeon: Dolores Frame, MD;  [facility];  Service: Gastroenterology;  Laterality: N/A;  Am   ESOPHAGEAL DILATION N/A 05/21/2019   Procedure: ESOPHAGEAL DILATION;  Surgeon: Malissa Hippo, MD;  [facility];  Service: Endoscopy;  Laterality: N/A;   ESOPHAGOGASTRODUODENOSCOPY N/A 05/21/2019   Procedure: ESOPHAGOGASTRODUODENOSCOPY (EGD);  Surgeon: Malissa Hippo, MD;  [facility];  Service: Endoscopy;  Laterality: N/A;  730   ESOPHAGOGASTRODUODENOSCOPY (EGD) WITH PROPOFOL N/A 08/30/2018   Procedure: ESOPHAGOGASTRODUODENOSCOPY (EGD) WITH PROPOFOL;  Surgeon: Malissa Hippo, MD;  [facility];  Service: Endoscopy;  Laterality: N/A;  1:30   ESOPHAGOGASTRODUODENOSCOPY (EGD) WITH PROPOFOL N/A 11/05/2020   Procedure: ESOPHAGOGASTRODUODENOSCOPY (EGD) WITH PROPOFOL;  Surgeon: Dolores Frame, MD;  [facility];  Service: Gastroenterology;  Laterality: N/A;   HERNIA REPAIR     bilateral lower abdomen   POLYPECTOMY  11/05/2020   Procedure: POLYPECTOMY;  Surgeon: Dolores Frame, MD;  [facility];  Service: Gastroenterology;;   Gaspar Bidding DILATION  11/05/2020    Procedure: Gaspar Bidding DILATION;  Surgeon: Marguerita Merles, Reuel Boom, MD;  [facility];  Service: Gastroenterology;;   Social History:   reports that he quit smoking about 29 years ago. His smoking use included cigarettes. He has a 66.00 pack-year smoking history. He has never used smokeless tobacco. He reports that he does not currently use alcohol. He reports that he does not use drugs.   Family History:  His family history is not on file.   Allergies: Allergies  Allergen Reactions   Penicillins Rash    No problems with ampicillin during hospitalization   Home Medications: Prior to Admission medications   Medication Sig Start Date End Date Taking? Authorizing Provider  acetaminophen (TYLENOL) 325 MG tablet Take 2 tablets (650 mg total) by mouth every 6 (six) hours as needed for mild pain (or Fever >/= 101). 11/26/19  Yes Emokpae, Courage,  MD  albuterol (VENTOLIN HFA) 108 (90 Base) MCG/ACT inhaler Inhale 2 puffs into the lungs every 4 (four) hours as needed for wheezing or shortness of breath. 11/26/19  Yes Emokpae, Courage, MD  ALPRAZolam Prudy Feeler) 1 MG tablet Take 1 tablet (1 mg total) by mouth 3 (three) times daily as needed for anxiety. 01/28/19  Yes Welborn, Ryan, DO  amLODipine (NORVASC) 2.5 MG tablet Take 1 tablet (2.5 mg total) by mouth daily. For BP 11/26/19  Yes Emokpae, Courage, MD  buprenorphine (SUBUTEX) 8 MG SUBL SL tablet Place 8 mg under the tongue daily. 01/04/19  Yes [provider]  cetirizine (ZYRTEC) 10 MG tablet Take 10 mg by mouth as needed for allergies (itching).   Yes [provider]  fluticasone (FLONASE) 50 MCG/ACT nasal spray Place 2 sprays into both nostrils daily.  08/22/18  Yes [provider]  gabapentin (NEURONTIN) 400 MG capsule Take 1 capsule (400 mg total) by mouth every 8 (eight) hours as needed. 01/28/19  Yes Welborn, Ryan, DO  glyBURIDE (DIABETA) 5 MG tablet Take 10 mg by mouth 2 (two) times daily with a meal.  11/19/14  Yes  [provider]  metFORMIN (GLUCOPHAGE) 1000 MG tablet Take 1,000 mg by mouth 2 (two) times daily with a meal.  12/07/14  Yes [provider]  montelukast (SINGULAIR) 10 MG tablet Take 1 tablet (10 mg total) by mouth at bedtime. 06/15/22  Yes Mannam, Praveen, MD  pantoprazole (PROTONIX) 40 MG tablet Take 1 tablet (40 mg total) by mouth daily. 12/15/20  Yes Dolores Frame, MD  valsartan (DIOVAN) 40 MG tablet Take 1 tablet (40 mg total) by mouth daily. For Heart and blood pressure 11/26/19  Yes Emokpae, Courage, MD  aspirin EC 81 MG tablet Take 1 tablet (81 mg total) by mouth daily. Swallow whole. Patient not taking: Reported on 11/30/2022 09/30/20   Jonelle Sidle, MD  Fluticasone-Umeclidin-Vilant (TRELEGY ELLIPTA) 100-62.5-25 MCG/ACT AEPB Inhale 1 puff into the lungs daily. 06/15/22   Mannam, Colbert Coyer, MD  guaiFENesin (MUCINEX) 600 MG 12 hr tablet Take 1 tablet (600 mg total) by mouth 2 (two) times daily. 11/26/19   Emokpae, Courage, MD  ipratropium-albuterol (DUONEB) 0.5-2.5 (3) MG/3ML SOLN Inhale 3 mLs into the lungs every 6 (six) hours as needed. Patient not taking: Reported on 11/30/2022    [provider]  levocetirizine (XYZAL) 5 MG tablet Take 1 tablet by mouth. occasionally 07/23/19   [provider]  polyethylene glycol (MIRALAX / GLYCOLAX) 17 g packet Take 17 g by mouth 2 (two) times daily. Patient not taking: Reported on 11/30/2022 11/26/19   Shon Hale, MD  rosuvastatin (CRESTOR) 20 MG tablet TAKE ONE TABLET BY MOUTH ONCE DAILY **PATIENT NEEDS APPOINTMENT FOR FUTURE REFILLS** 10/11/21   Jonelle Sidle, MD  TRELEGY ELLIPTA 100-62.5-25 MCG/INH AEPB inhale ONE PUFF into THE lungs DAILY 05/24/20   Mannam, Colbert Coyer, MD  triamcinolone cream (KENALOG) 0.1 % Apply 1 application topically daily as needed. 11/05/19   [provider]   Signature:   Tim Lair, PA-C [facility] 12/01/22 1:40 PM  Please see  [email or web address] for pager details.  From 7A-7P if no response, please call [phone] After hours, please call ELink [phone]

## 2022-12-01 NOTE — Progress Notes (Signed)
PT Cancellation Note  Patient Details Name: Travis Beck MRN: [id number] DOB: [birth year]   Cancelled Treatment:    Reason Eval/Treat Not Completed: Patient not medically ready   12/01/22 1001  PT Visit Information  Last PT Received On 12/01/22  Reason Eval/Treat Not Completed Patient not medically ready  History of Present Illness 77 y.o. male, with medical history significant of arthritis, childhood asthma, type 2 diabetes mellitus and hypertension.     -Patient lives at home with his brother, sister at bedside assists with the history, presents to ED secondary to complaints of flank pain, dysuria, difficulty urination, mild confusion, upon presentation to ED he was noted to be with significant hypoxia and increased work of breathing, denies fever, chills at home, is with known history of COPD, but does not use any oxygen at home.   RN deferring PT evaluation at the time due medical status currently. PT will attempt evaluation at later date as schedule permits.   Nelida Meuse 12/01/2022, 10:02 AM

## 2022-12-01 NOTE — Progress Notes (Signed)
  Echocardiogram 2D Echocardiogram has been performed.  Lucendia Herrlich 12/01/2022, 3:39 PM

## 2022-12-01 NOTE — Progress Notes (Signed)
Pt now on unit

## 2022-12-01 NOTE — Progress Notes (Signed)
CRITICAL VALUE STICKER  CRITICAL VALUE: Lactic Acid 2.2  RECEIVER (on-site recipient of call): Namira, RN  DATE & TIME NOTIFIED:   MESSENGER (representative from lab):  MD NOTIFIED: Dr. Carren Rang  TIME OF NOTIFICATION:0036  RESPONSE:  repeat LA

## 2022-12-01 NOTE — Progress Notes (Signed)
PROGRESS NOTE    Patient: Travis Beck                            PCP: Kirstie Peri, MD                    DOB: Jun 05, 1946            DOA: 11/30/2022 WUJ:[id number]             DOS: 12/01/2022, 9:28 AM   LOS: 1 day   Date of Service: The patient was seen and examined on 12/01/2022  Subjective:   The patient was seen and examined this morning-in ICU setting, patient remained hypotensive on Levophed... Shortness of breath on BiPAP Awake following commands  Full code   Brief Narrative:    Travis Beck  is a 77 y.o. male, with medical history significant of arthritis, childhood asthma, type 2 diabetes mellitus and hypertension.    -Patient lives at home with his brother, sister at bedside assists with the history, presents to ED secondary to complaints of flank pain, dysuria, difficulty urination, mild confusion, upon presentation to ED he was noted to be with significant hypoxia and increased work of breathing, denies fever, chills at home, is with known history of COPD, but does not use any oxygen at home.  ED: He was noted to be with severe wheezing, hypoxemia/hypoxia, for which she required 2 L oxygen via nasal cannula, then he was changed to BiPAP, CTA chest: negative for PE, but significant for multifocal pneumonia.   Assessment & Plan:     Principal Problem:   Acute respiratory failure with hypoxia (HCC) Active Problems:   GERD (gastroesophageal reflux disease)   Acute exacerbation of COPD with asthma (HCC)   Anemia, unspecified   Multifocal pneumonia   Dysuria       Hypoxemic respiratory failure Severe sepsis  due to multifocal pneumonia, POA COPD exacerbation  -Remain in respiratory failure-BiPAP ABG    Component Value Date/Time   PHART 7.41 12/01/2022 0745   PCO2ART 35 12/01/2022 0745   PO2ART 133 (H) 12/01/2022 0745   HCO3 22.2 12/01/2022 0745   ACIDBASEDEF 1.9 12/01/2022 0745   O2SAT 99.3 12/01/2022 0745    -Patient presents with significant  respiratory distress, severe hypoxemia, increased work of breathing. -Improving on BiPAP. -CTA chest ruled out PE, but significant for multifocal pneumonia -Blood cultures, sputum culture, Legionella antigen, strep pneumonia antigen -Encouraged use incentive spirometry, flutter valve, will start on chest vest as well -on IV Rocephin, and azithromycin, will start on vancomycin this can be discontinued if MRSA PCR is negative -wheezing-  continue with the scheduled DuoNebs, Breo Ellipta and will start on IV Solu-Medrol - - Trending - lactic acid  2.3 >> 2.2 >>3.0 - negative COVID-19    Consulted PCCM Dr. Rhodia Albright -the case in detail As patient is moving around direction, agreed to move the patient to ICU at [facility] today 12/01/2022    Hypotensive  Likely due to sepsis, continue Levophed  H/o hypertension -Hold home medications and keep on as needed hydralazine   Hyponatremia -Will start on IV NS   Dysuria/right flank pain -No clear etiology, his UA is negative, as well CT protocol with no hydronephrosis or nephrolithiasis   Diabetes mellitus -Hold home medication and keep an insulin sliding scale during hospital stay      AKI -  due to sepsis, continue with IV fluids, avoid hypotension  Chronic pain syndrome -Resume home Xanax, gabapentin and buprenorphine when more stable          Assessment & Plan:   Principal Problem:   Acute respiratory failure with hypoxia (HCC) Active Problems:   GERD (gastroesophageal reflux disease)   Acute exacerbation of COPD with asthma (HCC)   Anemia, unspecified   Multifocal pneumonia   Dysuria    ------------------------------------------------------------------------------------------------------------------------- Nutritional status:  The patient's BMI is: Body mass index is 23.55 kg/m. I agree with the assessment and plan as outlined below: Nutrition Status:       ------------------------------------------------------------------------------------------------------------------------ Cultures; Blood Cultures x 2 >>  Sputum Culture >> Pending collection    ------------------------------------------------------------------------------------------------------------------------------------------------  DVT prophylaxis:  enoxaparin (LOVENOX) injection 40 mg Start: 11/30/22 2045   Code Status:   Code Status: Full Code  Family Communication: No family member present at bedside- attempt will be made to update daily The above findings and plan of care has been discussed with patient (and family)  in detail,  they expressed understanding and agreement of above. -Advance care planning has been discussed.   Admission status:   Status is: Inpatient Remains inpatient appropriate because: Patient needing aggressive treatment for sepsis, possible septic shock   Cussed the case with PCCM at [facility] Dr. Rhodia Albright  accepted the patient for transfer   Disposition: From  - home             Planning for discharge in 1-2 days: to   Procedures:   No admission procedures for hospital encounter.   Antimicrobials:  Anti-infectives (From admission, onward)    Start     Dose/Rate Route Frequency Ordered Stop   12/01/22 1900  vancomycin (VANCOCIN) IVPB 1000 mg/200 mL premix        1,000 mg 200 mL/hr over 60 Minutes Intravenous Every 24 hours 11/30/22 1840     12/01/22 1500  azithromycin (ZITHROMAX) 500 mg in sodium chloride 0.9 % 250 mL IVPB        500 mg 250 mL/hr over 60 Minutes Intravenous Every 24 hours 11/30/22 1820     12/01/22 1400  cefTRIAXone (ROCEPHIN) 2 g in sodium chloride 0.9 % 100 mL IVPB        2 g 200 mL/hr over 30 Minutes Intravenous Every 24 hours 11/30/22 1820     11/30/22 1830  vancomycin (VANCOREADY) IVPB 1500 mg/300 mL        1,500 mg 150 mL/hr over 120 Minutes Intravenous  Once 11/30/22 1827 11/30/22 2041   11/30/22 1545   cefTRIAXone (ROCEPHIN) 1 g in sodium chloride 0.9 % 100 mL IVPB        1 g 200 mL/hr over 30 Minutes Intravenous  Once 11/30/22 1531 11/30/22 1618   11/30/22 1545  azithromycin (ZITHROMAX) 500 mg in sodium chloride 0.9 % 250 mL IVPB        500 mg 250 mL/hr over 60 Minutes Intravenous  Once 11/30/22 1531 11/30/22 1730        Medication:   acidophilus  1 capsule Oral BID WC   buprenorphine  0.15 mg Intravenous Once   Chlorhexidine Gluconate Cloth  6 each Topical Daily   enoxaparin (LOVENOX) injection  40 mg Subcutaneous Q24H   fluticasone furoate-vilanterol  1 puff Inhalation Daily   And   umeclidinium bromide  1 puff Inhalation Daily   hydrOXYzine  50 mg Oral Once   insulin aspart  0-15 Units Subcutaneous Q4H   ipratropium-albuterol  3 mL Nebulization Q4H   methylPREDNISolone (SOLU-MEDROL)  injection  40 mg Intravenous Q24H   midodrine  10 mg Oral TID WC   montelukast  10 mg Oral QHS   mupirocin ointment  1 Application Nasal BID   pantoprazole (PROTONIX) IV  40 mg Intravenous Q24H    hydrALAZINE, HYDROmorphone (DILAUDID) injection **OR** HYDROmorphone (DILAUDID) injection   Objective:   Vitals:   12/01/22 0700 12/01/22 0715 12/01/22 0755 12/01/22 0855  BP: (!) 121/55 100/70    Pulse: 76 82    Resp: 19 17    Temp:   98.3 F (36.8 C)   TempSrc:   Axillary   SpO2: 99% 100%  94%  Weight:      Height:        Intake/Output Summary (Last 24 hours) at 12/01/2022 1610 Last data filed at 12/01/2022 0600 Gross per 24 hour  Intake 680.45 ml  Output 2600 ml  Net -1919.55 ml   Filed Weights   11/30/22 1403 11/30/22 2015  Weight: 68 kg 64.2 kg     Physical examination:   Constitution: Awake and respite distress shortness of breath on BiPAP Psychiatric:   Normal and stable mood and affect, cognition intact,   HEENT:        Normocephalic, PERRL, otherwise with in Normal limits  Chest:         Chest symmetric Cardio vascular:  S1/S2, RRR, No murmure, No Rubs or Gallops   pulmonary: -Diminished air sounds mid to lower lobe bilaterally, Abdomen: Soft, non-tender, non-distended, bowel sounds,no masses, no organomegaly Muscular skeletal: Limited exam - in bed, able to move all 4 extremities,   Neuro: CNII-XII intact. , normal motor and sensation, reflexes intact  Extremities: No pitting edema lower extremities, +2 pulses  Skin: Dry, warm to touch, negative for any Rashes, No open wounds Wounds: per nursing documentation   ------------------------------------------------------------------------------------------------------------------------------------------    LABs:     Latest Ref Rng & Units 12/01/2022    5:41 AM 11/30/2022    2:26 PM 05/11/2020    9:18 AM  CBC  WBC 4.0 - 10.5 K/uL 7.7  7.3  7.6   Hemoglobin 13.0 - 17.0 g/dL 96.0  45.4  09.8   Hematocrit 39.0 - 52.0 % 38.6  41.9  36.6   Platelets 150 - 400 K/uL 247  284  241.0       Latest Ref Rng & Units 12/01/2022    5:41 AM 11/30/2022    2:26 PM 04/13/2022    8:17 AM  CMP  Glucose 70 - 99 mg/dL 119  147  829   BUN 8 - 23 mg/dL 22  24  12    Creatinine 0.61 - 1.24 mg/dL 5.62  1.30  8.65   Sodium 135 - 145 mmol/L 135  131  138   Potassium 3.5 - 5.1 mmol/L 3.9  3.9  4.1   Chloride 98 - 111 mmol/L 105  94  101   CO2 22 - 32 mmol/L 19  24  27    Calcium 8.9 - 10.3 mg/dL 8.3  9.1  9.0   Total Protein 6.5 - 8.1 g/dL  7.7    Total Bilirubin 0.3 - 1.2 mg/dL  1.5    Alkaline Phos 38 - 126 U/L  59    AST 15 - 41 U/L  18    ALT 0 - 44 U/L  15         Micro Results Recent Results (from the past 240 hour(s))  Blood culture (routine x 2)     Status: None (  Preliminary result)   Collection Time: 11/30/22  2:26 PM   Specimen: BLOOD  Result Value Ref Range Status   Specimen Description BLOOD RIGHT ANTECUBITAL  Final   Special Requests   Final    BOTTLES DRAWN AEROBIC AND ANAEROBIC Blood Culture adequate volume   Culture   Final    NO GROWTH < 24 HOURS Performed at [facility], [address],  [place], [place] [address]    Report Status PENDING  Incomplete  Blood culture (routine x 2)     Status: None (Preliminary result)   Collection Time: 11/30/22  4:03 PM   Specimen: BLOOD  Result Value Ref Range Status   Specimen Description BLOOD BLOOD LEFT FOREARM  Final   Special Requests   Final    BOTTLES DRAWN AEROBIC AND ANAEROBIC Blood Culture adequate volume   Culture   Final    NO GROWTH < 24 HOURS Performed at [facility], [address] [place], [place] [address]    Report Status PENDING  Incomplete  SARS Coronavirus 2 by RT PCR (hospital order, performed in [facility] hospital lab) *cepheid single result test* Anterior Nasal Swab     Status: None   Collection Time: 11/30/22  5:13 PM   Specimen: Anterior Nasal Swab  Result Value Ref Range Status   SARS Coronavirus 2 by RT PCR NEGATIVE NEGATIVE Final    Comment: (NOTE) SARS-CoV-2 target nucleic acids are NOT DETECTED.  The SARS-CoV-2 RNA is generally detectable in upper and lower respiratory specimens during the acute phase of infection. The lowest concentration of SARS-CoV-2 viral copies this assay can detect is 250 copies / mL. A negative result does not preclude SARS-CoV-2 infection and should not be used as the sole basis for treatment or other patient management decisions.  A negative result may occur with improper specimen collection / handling, submission of specimen other than nasopharyngeal swab, presence of viral mutation(s) within the areas targeted by this assay, and inadequate number of viral copies (<250 copies / mL). A negative result must be combined with clinical observations, patient history, and epidemiological information.  Fact Sheet for Patients:   RoadLapTop.co.za  Fact Sheet for Healthcare Providers: http://kim-miller.com/  This test is not yet approved or  cleared by the [place] FDA and has been authorized for detection and/or diagnosis of  SARS-CoV-2 by FDA under an Emergency Use Authorization (EUA).  This EUA will remain in effect (meaning this test can be used) for the duration of the COVID-19 declaration under Section 564(b)(1) of the Act, 21 U.S.C. section 360bbb-3(b)(1), unless the authorization is terminated or revoked sooner.  Performed at [facility], [address] [place], [place] [address]   Respiratory (~20 pathogens) panel by PCR     Status: None   Collection Time: 11/30/22  5:13 PM   Specimen: Nasopharyngeal Swab; Respiratory  Result Value Ref Range Status   Adenovirus NOT DETECTED NOT DETECTED Final   Coronavirus 229E NOT DETECTED NOT DETECTED Final    Comment: (NOTE) The Coronavirus on the Respiratory Panel, DOES NOT test for the novel  Coronavirus (2019 nCoV)    Coronavirus HKU1 NOT DETECTED NOT DETECTED Final   Coronavirus NL63 NOT DETECTED NOT DETECTED Final   Coronavirus OC43 NOT DETECTED NOT DETECTED Final   Metapneumovirus NOT DETECTED NOT DETECTED Final   Rhinovirus / Enterovirus NOT DETECTED NOT DETECTED Final   Influenza A NOT DETECTED NOT DETECTED Final   Influenza B NOT DETECTED NOT DETECTED Final   Parainfluenza Virus 1  NOT DETECTED NOT DETECTED Final   Parainfluenza Virus 2 NOT DETECTED NOT DETECTED Final   Parainfluenza Virus 3 NOT DETECTED NOT DETECTED Final   Parainfluenza Virus 4 NOT DETECTED NOT DETECTED Final   Respiratory Syncytial Virus NOT DETECTED NOT DETECTED Final   Bordetella pertussis NOT DETECTED NOT DETECTED Final   Bordetella Parapertussis NOT DETECTED NOT DETECTED Final   Chlamydophila pneumoniae NOT DETECTED NOT DETECTED Final   Mycoplasma pneumoniae NOT DETECTED NOT DETECTED Final    Comment: Performed at [facility], [address] [place], [place] [address]  MRSA Next Gen by PCR, Nasal     Status: Abnormal   Collection Time: 11/30/22  8:05 PM   Specimen: Nasal Mucosa; Nasal Swab  Result Value Ref Range Status   MRSA by PCR Next Gen DETECTED (A) NOT  DETECTED Final    Comment: RESULT CALLED TO, READ BACK BY AND VERIFIED WITH: S. BHATTARAI AT 0122 ON 05.24.24 BY ADGER J         The GeneXpert MRSA Assay (FDA approved for NASAL specimens only), is one component of a comprehensive MRSA colonization surveillance program. It is not intended to diagnose MRSA infection nor to guide or monitor treatment for MRSA infections. Performed at [facility], [address], [place], [place] [address]     Radiology Reports CT Angio Chest Pulmonary Embolism (PE) W or WO Contrast  Result Date: 11/30/2022 CLINICAL DATA:  Hypoxia EXAM: CT ANGIOGRAPHY CHEST WITH CONTRAST TECHNIQUE: Multidetector CT imaging of the chest was performed using the standard protocol during bolus administration of intravenous contrast. Multiplanar CT image reconstructions and MIPs were obtained to evaluate the vascular anatomy. RADIATION DOSE REDUCTION: This exam was performed according to the departmental dose-optimization program which includes automated exposure control, adjustment of the mA and/or kV according to patient size and/or use of iterative reconstruction technique. CONTRAST:  75mL OMNIPAQUE IOHEXOL 350 MG/ML SOLN COMPARISON:  09/23/2020 FINDINGS: Despite efforts by the technologist and patient, motion artifact is present on today's exam and could not be eliminated. This reduces exam sensitivity and specificity. Cardiovascular: No filling defect is identified in the pulmonary arterial tree to suggest pulmonary embolus. Coronary, aortic arch, and branch vessel atherosclerotic vascular disease. Mediastinum/Nodes: 1.2 cm right hilar lymph node. 1.0 cm left infrahilar lymph node. 1.2 cm subcarinal lymph node. Small type 1 hiatal hernia.  Mildly dilated distal esophagus. Lungs/Pleura: Consolidation of most of the left lower lobe aside from the superior segment. Substantial airspace opacity posteriorly in the right lower lobe, especially in the posterior basal segment. Appearance  suspicious for multilobar pneumonia or aspiration pneumonitis. Patchy reticulonodular opacities are present in both upper lobes along with some bandlike atelectasis at the lung apices. Benign 3 mm calcified granuloma noted in the left upper lobe on image 66 series 6. Upper Abdomen: Abdominal aortic atherosclerosis. Separate origin of the splenic artery from the rest of the celiac trunk; atheromatous calcification at the origins of the upper abdominal aortic branches without appreciable occlusion. Musculoskeletal: Sternoclavicular arthropathy bilaterally. Review of the MIP images confirms the above findings. IMPRESSION: 1. No filling defect is identified in the pulmonary arterial tree to suggest pulmonary embolus. To motion artifact. Reduced sensitivity due 2. Consolidation of most of the left lower lobe aside from the superior segment. Substantial airspace opacity posteriorly in the right lower lobe, especially in the posterior basal segment. Appearance suspicious for multilobar pneumonia or aspiration pneumonitis. 3. Patchy reticulonodular opacities are present in both upper lobes along with some bandlike atelectasis at  the lung apices. 4. Mild bilateral hilar and subcarinal adenopathy, probably reactive. 5. Small type 1 hiatal hernia. Mildly dilated distal esophagus. 6. Aortic atherosclerosis. Aortic Atherosclerosis (ICD10-I70.0). Electronically Signed   By: Gaylyn Rong M.D.   On: 11/30/2022 18:00   CT Renal Stone Study  Result Date: 11/30/2022 CLINICAL DATA:  Abdominal/flank pain, stone suspected.  Weakening EXAM: CT ABDOMEN AND PELVIS WITHOUT CONTRAST TECHNIQUE: Multidetector CT imaging of the abdomen and pelvis was performed following the standard protocol without IV contrast. RADIATION DOSE REDUCTION: This exam was performed according to the departmental dose-optimization program which includes automated exposure control, adjustment of the mA and/or kV according to patient size and/or use of  iterative reconstruction technique. COMPARISON:  Chest radiograph performed earlier on the same date. FINDINGS: Lower chest: Bilateral lower lobe consolidations left greater than the right with air bronchograms concerning for bilateral pneumonia. Prominent atherosclerotic calcification of coronary arteries. Hepatobiliary: No focal liver abnormality is seen. No gallstones, gallbladder wall thickening, or biliary dilatation. Pancreas: Generalized pancreatic atrophy no pancreatic ductal dilatation or surrounding inflammatory changes. Spleen: Normal in size without focal abnormality. Adrenals/Urinary Tract: Adrenal glands are unremarkable. Kidneys are normal, without renal calculi, focal lesion, or hydronephrosis. Exophytic bilateral renal cysts which contains mildly complex fluid. The interpolar right renal cyst measures a proximally 2.2 x 2.7 cm and in the lower pole of the left kidney measures a proximally 2.4 x 2.3 cm. Foley's catheter in the urinary bladder with trace amount of air likely iatrogenic. Stomach/Bowel: Stomach is within normal limits. Appendix appears normal. No evidence of bowel wall thickening, distention, or inflammatory changes. Sigmoid colonic diverticulosis without evidence of acute diverticulitis. Vascular/Lymphatic: Aortic atherosclerosis. No enlarged abdominal or pelvic lymph nodes. Reproductive: Prostate is unremarkable. Other: No abdominal wall hernia or abnormality. No abdominopelvic ascites. 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 No acute osseous abnormality. IMPRESSION: 1. Bilateral lower lobe consolidations left greater than the right with air bronchograms concerning for bilateral pneumonia. 2. No evidence of nephrolithiasis or hydronephrosis. 3. Sigmoid colonic diverticulosis without evidence of acute diverticulitis. 4. Aortic atherosclerosis and coronary artery calcification. 5. Multilevel degenerate disc disease  of the lumbar spine with associated facet joint arthropathy. No acute osseous abnormality. Aortic Atherosclerosis (ICD10-I70.0). Electronically Signed   By: Larose Hires D.O.   On: 11/30/2022 16:19   DG Chest Port 1 View  Result Date: 11/30/2022 CLINICAL DATA:  hypoxia EXAM: PORTABLE CHEST - 1 VIEW COMPARISON:  08/08/2022 FINDINGS: Relatively low lung volumes. New consolidation/atelectasis at the left lung base obscuring the diaphragmatic leaflet. Mild scattered perihilar of the lower opacities left greater than right. Heart size and mediastinal contours are within normal limits. No effusion. Visualized bones unremarkable. IMPRESSION: New left lower lobe consolidation/atelectasis. Electronically Signed   By: Corlis Leak M.D.   On: 11/30/2022 14:54    SIGNED: Kendell Bane, MD, FHM. FAAFP. [facility] - [facility] Time spent - 75 min.  Of critical time was spent in seeing, evaluating and examining the patient. Reviewing medical records, labs, drawn plan of care. [facility],  Pager (please use [email or web address] to page/ text) Please use Epic Secure Chat for non-urgent communication (7AM-7PM)  If 7PM-7AM, please contact night-coverage [email or web address], 12/01/2022, 9:28 AM

## 2022-12-01 NOTE — Progress Notes (Addendum)
Patient woke up from sleep and started complaining of severe flank pain 10/10, started panicking, restless, and wanted to take the BIPAP mask off, gave 0.5mg  of prescribed dilaudid, patient's anxiety did not get better, BP peaked upto 200s systolic, stopped the Levophed, HR peaked to 120s, RR 35-40s, MD made aware,took the BIPAP mask, swabbed patient's mouth, kept hot packs on the flank, patient felt better, 12 leads EKG done, 1mg  of Ativan given as prescribed, patient's pain and anxiety getting better, now sleeping, vitals WDL, Bp dropped again after patient calmed down, Levophed restarted again, will continue to monitor, will endorse to the oncoming RN

## 2022-12-01 NOTE — Progress Notes (Signed)
Abg drawn and taken to lab

## 2022-12-01 NOTE — Care Management Important Message (Signed)
Important Message  Patient Details  Name: Travis Beck MRN: [id number] Date of Birth: 04-Oct-1945   Medicare Important Message Given:  N/A - LOS <3 / Initial given by admissions     Corey Harold 12/01/2022, 10:50 AM

## 2022-12-01 NOTE — Progress Notes (Signed)
CRITICAL VALUE STICKER  CRITICAL VALUE: Lactic Acid 3.0  RECEIVER (on-site recipient of call):Jamie Greig Right, RN   DATE & TIME NOTIFIED:   MESSENGER (representative from lab):  MD NOTIFIED: Dr. Carren Rang,  TIME OF NOTIFICATION:06:17  RESPONSE:  awaiting

## 2022-12-02 ENCOUNTER — Inpatient Hospital Stay (HOSPITAL_COMMUNITY): Payer: Medicare Other

## 2022-12-02 DIAGNOSIS — R6521 Severe sepsis with septic shock: Secondary | ICD-10-CM

## 2022-12-02 DIAGNOSIS — J189 Pneumonia, unspecified organism: Secondary | ICD-10-CM | POA: Diagnosis not present

## 2022-12-02 DIAGNOSIS — J441 Chronic obstructive pulmonary disease with (acute) exacerbation: Secondary | ICD-10-CM | POA: Diagnosis not present

## 2022-12-02 DIAGNOSIS — A419 Sepsis, unspecified organism: Secondary | ICD-10-CM

## 2022-12-02 DIAGNOSIS — R300 Dysuria: Secondary | ICD-10-CM | POA: Diagnosis not present

## 2022-12-02 DIAGNOSIS — K2100 Gastro-esophageal reflux disease with esophagitis, without bleeding: Secondary | ICD-10-CM

## 2022-12-02 DIAGNOSIS — J9601 Acute respiratory failure with hypoxia: Secondary | ICD-10-CM | POA: Diagnosis not present

## 2022-12-02 LAB — CULTURE, RESPIRATORY W GRAM STAIN

## 2022-12-02 LAB — COMPREHENSIVE METABOLIC PANEL
ALT: 13 U/L (ref 0–44)
AST: 16 U/L (ref 15–41)
Albumin: 2.5 g/dL — ABNORMAL LOW (ref 3.5–5.0)
Alkaline Phosphatase: 37 U/L — ABNORMAL LOW (ref 38–126)
Anion gap: 12 (ref 5–15)
BUN: 16 mg/dL (ref 8–23)
CO2: 21 mmol/L — ABNORMAL LOW (ref 22–32)
Calcium: 8.2 mg/dL — ABNORMAL LOW (ref 8.9–10.3)
Chloride: 100 mmol/L (ref 98–111)
Creatinine, Ser: 0.85 mg/dL (ref 0.61–1.24)
GFR, Estimated: >60 mL/min (ref >60)
Glucose, Bld: 183 mg/dL — ABNORMAL HIGH (ref 70–99)
Potassium: 3.7 mmol/L (ref 3.5–5.1)
Sodium: 133 mmol/L — ABNORMAL LOW (ref 135–145)
Total Bilirubin: 0.8 mg/dL (ref 0.3–1.2)
Total Protein: 5.3 g/dL — ABNORMAL LOW (ref 6.5–8.1)

## 2022-12-02 LAB — CBC
HCT: 34.1 % — ABNORMAL LOW (ref 39.0–52.0)
Hemoglobin: 11.4 g/dL — ABNORMAL LOW (ref 13.0–17.0)
MCH: 29.8 pg (ref 26.0–34.0)
MCHC: 33.4 g/dL (ref 30.0–36.0)
MCV: 89 fL (ref 80.0–100.0)
Platelets: 202 10*3/uL (ref 150–400)
RBC: 3.83 MIL/uL — ABNORMAL LOW (ref 4.22–5.81)
RDW: 12.8 % (ref 11.5–15.5)
WBC: 7.3 10*3/uL (ref 4.0–10.5)
nRBC: 0 % (ref 0.0–0.2)

## 2022-12-02 LAB — HEMOGLOBIN A1C
Hgb A1c MFr Bld: 6.8 % — ABNORMAL HIGH (ref 4.8–5.6)
Mean Plasma Glucose: 148 mg/dL

## 2022-12-02 LAB — MAGNESIUM: Magnesium: 1.5 mg/dL — ABNORMAL LOW (ref 1.7–2.4)

## 2022-12-02 LAB — GLUCOSE, CAPILLARY
Glucose-Capillary: 196 mg/dL — ABNORMAL HIGH (ref 70–99)
Glucose-Capillary: 276 mg/dL — ABNORMAL HIGH (ref 70–99)
Glucose-Capillary: 192 mg/dL — ABNORMAL HIGH (ref 70–99)
Glucose-Capillary: 101 mg/dL — ABNORMAL HIGH (ref 70–99)

## 2022-12-02 LAB — LACTIC ACID, PLASMA: Lactic Acid, Venous: 1.5 mmol/L (ref 0.5–1.9)

## 2022-12-02 MED ORDER — MAGNESIUM SULFATE 4 GM/100ML IV SOLN
4.0000 g | Freq: Once | INTRAVENOUS | Status: AC
  Administered 2022-12-02: 4 g via INTRAVENOUS
  Filled 2022-12-02: qty 100

## 2022-12-02 MED ORDER — POTASSIUM CHLORIDE CRYS ER 20 MEQ PO TBCR
40.0000 meq | EXTENDED_RELEASE_TABLET | Freq: Once | ORAL | Status: AC
  Administered 2022-12-02: 40 meq via ORAL
  Filled 2022-12-02: qty 2

## 2022-12-02 MED ORDER — VANCOMYCIN HCL 1250 MG/250ML IV SOLN
1250.0000 mg | INTRAVENOUS | Status: DC
  Administered 2022-12-02: 1250 mg via INTRAVENOUS
  Filled 2022-12-02 (×2): qty 250

## 2022-12-02 MED ORDER — ACETAMINOPHEN 325 MG PO TABS
650.0000 mg | ORAL_TABLET | Freq: Four times a day (QID) | ORAL | Status: DC | PRN
  Administered 2022-12-02 (×2): 650 mg via ORAL
  Filled 2022-12-02 (×2): qty 2

## 2022-12-02 NOTE — Progress Notes (Signed)
eLink Physician-Brief Progress Note Patient Name: Travis Beck DOB: Feb 26, 1946 MRN: [id number]   Date of Service  12/02/2022  HPI/Events of Note  New onset headache, only Dilaudid as needed available  eICU Interventions  Add acetaminophen as needed     Intervention Category Minor Interventions: Routine modifications to care plan (e.g. PRN medications for pain, fever)  Noel Rodier 12/02/2022, 6:09 AM

## 2022-12-02 NOTE — Progress Notes (Signed)
[facility] ADULT ICU REPLACEMENT PROTOCOL   The patient does apply for the [facility] Adult ICU Electrolyte Replacment Protocol based on the criteria listed below:   1.Exclusion criteria: TCTS, ECMO, Dialysis, and Myasthenia Gravis patients 2. Is GFR >/= 30 ml/min? Yes.    Patient's GFR today is >60 3. Is SCr </= 2? Yes.   Patient's SCr is 0.85 mg/dL 4. Did SCr increase >/= 0.5 in 24 hours? No. 5.Pt's weight >40kg  Yes.   6. Abnormal electrolyte(s): Potassium 3.7, Magnesium 1.5  7. Electrolytes replaced per protocol 8.  Call MD STAT for K+ </= 2.5, Phos </= 1, or Mag </= 1 Physician:  Dr. Faye Ramsay A Abhiram Criado 12/02/2022 5:09 AM

## 2022-12-02 NOTE — Evaluation (Signed)
Physical Therapy Evaluation/ discharge Patient Details Name: Travis Beck MRN: [id number] DOB: [birth year] Today's Date: 12/02/2022  History of Present Illness  77 yo male adm to [facility] ED 5/23 with flank pain/dysuria, AMS, SOB/hypoxia, LLL PNA. 5/24 pt with shock and hypotension transfer to [facility] with pressor support. PMHx:HTN, HLD, COPD, T2DM, GERD  Clinical Impression  Pt pleasant and report independence at baseline walks his daughters dogs a mile daily, mows and cares for 70 chickens. Pt without history of falls with good stability with gait and transfers during session. Pt at baseline functional status without further need for therapy.   SPO2 >93% on RA throughout session HR 104 with gait       Recommendations for follow up therapy are one component of a multi-disciplinary discharge planning process, led by the attending physician.  Recommendations may be updated based on patient status, additional functional criteria and insurance authorization.  Follow Up Recommendations       Assistance Recommended at Discharge PRN  Patient can return home with the following  Assistance with cooking/housework    Equipment Recommendations None recommended by PT  Recommendations for Other Services       Functional Status Assessment Patient has not had a recent decline in their functional status     Precautions / Restrictions Precautions Precautions: None Restrictions Weight Bearing Restrictions: No      Mobility  Bed Mobility Overal bed mobility: Modified Independent                  Transfers Overall transfer level: Modified independent                      Ambulation/Gait Ambulation/Gait assistance: Modified independent (Device/Increase time) Gait Distance (Feet): 600 Feet Assistive device: None Gait Pattern/deviations: WFL(Within Functional Limits)   Gait velocity interpretation: >4.37 ft/sec, indicative of normal walking speed      Stairs             Wheelchair Mobility    Modified Rankin (Stroke Patients Only)       Balance Overall balance assessment: Mild deficits observed, not formally tested                                           Pertinent Vitals/Pain Pain Assessment Pain Assessment: No/denies pain    Home Living Family/patient expects to be discharged to:: Private residence Living Arrangements: Other relatives (brother who is blind) Available Help at Discharge: Family;Personal care attendant;Available PRN/intermittently Type of Home: House Home Access: Ramped entrance       Home Layout: One level Home Equipment: Wheelchair - Forensic psychologist (2 wheels);Shower seat      Prior Function               Mobility Comments: ADLs independently ADLs Comments: assist for cleaning and washing, tend to eat out or quick prep meals     Hand Dominance        Extremity/Trunk Assessment   Upper Extremity Assessment Upper Extremity Assessment: Overall WFL for tasks assessed    Lower Extremity Assessment Lower Extremity Assessment: Overall WFL for tasks assessed    Cervical / Trunk Assessment Cervical / Trunk Assessment: Normal  Communication   Communication: No difficulties  Cognition Arousal/Alertness: Awake/alert Behavior During Therapy: WFL for tasks assessed/performed Overall Cognitive Status: Within Functional Limits for tasks assessed  General Comments      Exercises     Assessment/Plan    PT Assessment Patient does not need any further PT services  PT Problem List         PT Treatment Interventions      PT Goals (Current goals can be found in the Care Plan section)  Acute Rehab PT Goals PT Goal Formulation: All assessment and education complete, DC therapy    Frequency       Co-evaluation               AM-PAC PT "6 Clicks" Mobility  Outcome Measure Help needed turning from your back  to your side while in a flat bed without using bedrails?: None Help needed moving from lying on your back to sitting on the side of a flat bed without using bedrails?: None Help needed moving to and from a bed to a chair (including a wheelchair)?: None Help needed standing up from a chair using your arms (e.g., wheelchair or bedside chair)?: None Help needed to walk in hospital room?: None Help needed climbing 3-5 steps with a railing? : None 6 Click Score: 24    End of Session   Activity Tolerance: Patient tolerated treatment well Patient left: in chair;with call bell/phone within reach;with chair alarm set Nurse Communication: Mobility status PT Visit Diagnosis: Other abnormalities of gait and mobility (R26.89)    Time: 2956-2130 PT Time Calculation (min) (ACUTE ONLY): 25 min   Charges:   PT Evaluation $PT Eval Low Complexity: 1 Low          Travis Beck P, PT Acute Rehabilitation Services Office: [phone]   Travis Beck 12/02/2022, 10:36 AM

## 2022-12-02 NOTE — Progress Notes (Signed)
Report called and pt stable for transport.   

## 2022-12-02 NOTE — Progress Notes (Signed)
Pt put on monitor on 6E and pts new bedside RN made aware of pts arrival

## 2022-12-02 NOTE — Progress Notes (Signed)
Pharmacy Antibiotic Note  Travis Beck is a 77 y.o. male admitted on 11/30/2022 with pneumonia.  Pharmacy has been consulted for vancomycin dosing.  Patient remains afebrile with no leukocytosis. Blood cultures are NGTD, respiratory cx are pending. Renal function has improved since admission, Scr 1.36 >> 0.85.   Plan: Increase vancomycin to 1250 mg IV q24 hours based on current renal function (Scr 0.85, Vd 0.72 L/kg, IBW, eAUC of 481) Continue ceftriaxone, azithromycin per MD Monitor clinical status, renal function, culture data F/u vancomycin levels as indicated, de-escalation as able, and LOT  Height: 5\' 5"  (165.1 cm) Weight: 64.2 kg (141 lb 8.6 oz) IBW/kg (Calculated) : 61.5  Temp (24hrs), Avg:98.2 F (36.8 C), Min:97.6 F (36.4 C), Max:98.6 F (37 C)  Recent Labs  Lab 11/30/22 1426 11/30/22 2057 11/30/22 2335 12/01/22 0541 12/01/22 0932 12/02/22 0034 12/02/22 0047  WBC 7.3  --   --  7.7  --  7.3  --   CREATININE 1.36*  --   --  1.14  --  0.85  --   LATICACIDVEN 2.8* 2.3* 2.2* 3.0* 3.4*  --  1.5     Estimated Creatinine Clearance: 62.3 mL/min (by C-G formula based on SCr of 0.85 mg/dL).    Allergies  Allergen Reactions   Penicillins Rash    No problems with ampicillin during hospitalization    Antimicrobials this admission: Azithromycin 5/23 >>  ceftriaxone 5/23 >>  Vancomycin 5/23 >>   Dose adjustments this admission: N/a  Microbiology results: 5/23 BCx: NGTD 5/23 Sputum: pending 5/23 MRSA PCR: positive 5/23 strep pneumo urinary antigen: negative 5/23 Legionella Ag: pending 5/23 RVPL: negative  Thank you for involving pharmacy in this patient's care.   Rockwell Alexandria, PharmD PGY1 Pharmacy Resident 12/02/2022 3:18 PM

## 2022-12-02 NOTE — Progress Notes (Signed)
OT Cancellation Note  Patient Details Name: Dickie Rebelo MRN: [id number] DOB: October 27, 1945   Cancelled Treatment:    Reason Eval/Treat Not Completed: OT screened, no needs identified, will sign off. Per PT, pt is functioning at mod I level and at baseline for ADL/IADL. OT to sign off. Please re-consult if change in status.   Tyler Deis, OTR/L [facility] Acute Rehabilitation Office: [phone]   Myrla Halsted 12/02/2022, 11:14 AM

## 2022-12-02 NOTE — Progress Notes (Signed)
Refused BIPAP, states he feels like he is breathing fine and will let me know if he changes his mind.

## 2022-12-02 NOTE — Progress Notes (Addendum)
PROGRESS NOTE  Travis Beck EXB:[id number] DOB: Sep 18, 1945   PCP: Kirstie Peri, MD  Patient is from: Home.  Lives with brother who is legally blind.  DOA: 11/30/2022 LOS: 2  Chief complaints Chief Complaint  Patient presents with   Altered Mental Status   Back Pain   Urinary Retention     Brief Narrative / Interim history: 77 year old M with PMH of COPD, DM-2, HTN, HLD, chronic pain and anxiety presented to [facility] ED on 5/23 with 1 to 2-week history of increased SOB and left-sided back pain that is worse with deep breathing and admitted to ICU due to septic shock with acute hypoxic respiratory failure in the setting of LLL and RLL pneumonia.  Reportedly desaturated to 82% on RA and started on BiPAP.  He was hypotensive requiring vasopressor. He also had mild AKI and lactic acidosis.    Eventually, patient came off vasopressors and transferred to [facility] on 5/25.  He is on vancomycin, ceftriaxone and Zithromax.     Subjective: Seen and examined earlier this morning.  No major events overnight of this morning.  No complaints.  Feels well.  Denies shortness of breath, cough or chest pain.  He reports back pain that he attributes to arthritis.  Objective: Vitals:   12/02/22 0600 12/02/22 0700 12/02/22 0901 12/02/22 1034  BP: 125/70 121/73    Pulse: 75 73  (!) 104  Resp: 13 15    Temp:  97.6 F (36.4 C)    TempSrc:  Oral    SpO2: 92% 94% 95% 93%  Weight:      Height:        Examination:  GENERAL: No apparent distress.  Nontoxic. HEENT: MMM.  Vision and hearing grossly intact.  NECK: Supple.  No apparent JVD.  RESP:  No IWOB.  Fair aeration bilaterally. CVS:  RRR. Heart sounds normal.  ABD/GI/GU: BS+. Abd soft, NTND.  MSK/EXT:  Moves extremities. No apparent deformity. No edema.  SKIN: no apparent skin lesion or wound NEURO: Awake, alert and oriented appropriately.  No apparent focal neuro deficit. PSYCH: Calm. Normal affect.   Procedures:   None  Microbiology summarized: COVID-19 and full RVP nonreactive MRSA PCR screen positive Blood cultures NGTD Respiratory culture pending.  Gram stain with few GPC's and yeast.  Assessment and plan: Principal Problem:   Acute respiratory failure with hypoxia (HCC) Active Problems:   GERD (gastroesophageal reflux disease)   Acute exacerbation of COPD with asthma (HCC)   Anemia, unspecified   Multifocal pneumonia   Dysuria   Septic shock in the setting of multilobar PNA: POA.  CXR with LLL and RLL infiltrate.  Required vasopressor on admission.  Sepsis physiology resolved.  MRSA PCR screen positive.  Blood cultures negative.  Respiratory culture pending. -Continue vancomycin, ceftriaxone and Zithromax -Pulmonary toilet   Acute hypoxemic respiratory failure: Due to the above.  Resolved.  COPD with emphysema: Former smoker.  Followed by Dr. Isaiah Serge as an outpatient -Continue triple therapy -Continue Solu-Medrol. -Antibiotics as above.  Controlled NIDDM-2 with hyperglycemia and HLD: A1c 6.8%.  On metformin and glipizide at home. Recent Labs  Lab 12/01/22 1148 12/01/22 1542 12/01/22 1702 12/01/22 2126 12/02/22 0721  GLUCAP 149* 135* 137* 159* 196*  -Continue current insulin regimen and adjust as appropriate -Continue statin  AKI: Due to septic shock.  Resolved. Recent Labs    04/13/22 0817 11/30/22 1426 12/01/22 0541 12/02/22 0034  BUN 12 24* 22 16  CREATININE 0.89 1.36* 1.14 0.85  -Avoid nephrotoxic  meds -Monitor   Essential hypertension: Normotensive off home antihypertensive meds. -Continue holding home meds.   Back pain, L-sided - pleuritic versus MSK, suspect this is more likely r/t PNA.  Has history of chronic back pain.  CT renal stone study negative for nephrolithiasis or hydronephrosis but multilevel DDD.  -Continue home Subutex -Tylenol as needed  -PT/OT  Anxiety: Stable -Continue home Xanax PRN  Skin cancer on scalp -Outpatient follow-up with  dermatology   At risk for malnutrition Body mass index is 23.55 kg/m. -Continue Ensure          DVT prophylaxis:  enoxaparin (LOVENOX) injection 40 mg Start: 11/30/22 2045  Code Status: Full code Family Communication: None at bedside Level of care: Med-Surg Status is: Inpatient Remains inpatient appropriate because: Septic shock due to multilobar pneumonia   Final disposition: Home Consultants:  Pulmonology admitted patient  55 minutes with more than 50% spent in reviewing records, counseling patient/family and coordinating care.   Sch Meds:  Scheduled Meds:  acidophilus  1 capsule Oral BID WC   arformoterol  15 mcg Nebulization BID   budesonide (PULMICORT) nebulizer solution  0.25 mg Nebulization BID   buprenorphine  8 mg Sublingual Daily   Chlorhexidine Gluconate Cloth  6 each Topical Daily   enoxaparin (LOVENOX) injection  40 mg Subcutaneous Q24H   feeding supplement  237 mL Oral BID BM   insulin aspart  0-20 Units Subcutaneous TID WC   insulin aspart  0-5 Units Subcutaneous QHS   methylPREDNISolone (SOLU-MEDROL) injection  40 mg Intravenous Q24H   montelukast  10 mg Oral QHS   mupirocin ointment  1 Application Nasal BID   pantoprazole  40 mg Oral QHS   revefenacin  175 mcg Nebulization Daily   Continuous Infusions:  sodium chloride Stopped (12/01/22 0801)   azithromycin Stopped (12/01/22 1712)   cefTRIAXone (ROCEPHIN)  IV 200 mL/hr at 12/01/22 1450   vancomycin Stopped (12/01/22 2059)   PRN Meds:.acetaminophen, ALPRAZolam, HYDROmorphone (DILAUDID) injection **OR** HYDROmorphone (DILAUDID) injection, ipratropium-albuterol, lip balm  Antimicrobials: Anti-infectives (From admission, onward)    Start     Dose/Rate Route Frequency Ordered Stop   12/01/22 1900  vancomycin (VANCOCIN) IVPB 1000 mg/200 mL premix        1,000 mg 200 mL/hr over 60 Minutes Intravenous Every 24 hours 11/30/22 1840     12/01/22 1500  azithromycin (ZITHROMAX) 500 mg in sodium chloride  0.9 % 250 mL IVPB        500 mg 250 mL/hr over 60 Minutes Intravenous Every 24 hours 11/30/22 1820     12/01/22 1400  cefTRIAXone (ROCEPHIN) 2 g in sodium chloride 0.9 % 100 mL IVPB        2 g 200 mL/hr over 30 Minutes Intravenous Every 24 hours 11/30/22 1820     11/30/22 1830  vancomycin (VANCOREADY) IVPB 1500 mg/300 mL        1,500 mg 150 mL/hr over 120 Minutes Intravenous  Once 11/30/22 1827 11/30/22 2041   11/30/22 1545  cefTRIAXone (ROCEPHIN) 1 g in sodium chloride 0.9 % 100 mL IVPB        1 g 200 mL/hr over 30 Minutes Intravenous  Once 11/30/22 1531 11/30/22 1618   11/30/22 1545  azithromycin (ZITHROMAX) 500 mg in sodium chloride 0.9 % 250 mL IVPB        500 mg 250 mL/hr over 60 Minutes Intravenous  Once 11/30/22 1531 11/30/22 1730        I have personally reviewed the following labs  and images: CBC: Recent Labs  Lab 11/30/22 1426 12/01/22 0541 12/02/22 0034  WBC 7.3 7.7 7.3  NEUTROABS 6.0  --   --   HGB 13.6 12.8* 11.4*  HCT 41.9 38.6* 34.1*  MCV 91.7 91.5 89.0  PLT 284 247 202   BMP &GFR Recent Labs  Lab 11/30/22 1426 12/01/22 0541 12/02/22 0034  NA 131* 135 133*  K 3.9 3.9 3.7  CL 94* 105 100  CO2 24 19* 21*  GLUCOSE 194* 227* 183*  BUN 24* 22 16  CREATININE 1.36* 1.14 0.85  CALCIUM 9.1 8.3* 8.2*  MG  --   --  1.5*   Estimated Creatinine Clearance: 62.3 mL/min (by C-G formula based on SCr of 0.85 mg/dL). Liver & Pancreas: Recent Labs  Lab 11/30/22 1426 12/02/22 0034  AST 18 16  ALT 15 13  ALKPHOS 59 37*  BILITOT 1.5* 0.8  PROT 7.7 5.3*  ALBUMIN 4.3 2.5*   No results for input(s): "LIPASE", "AMYLASE" in the last 168 hours. No results for input(s): "AMMONIA" in the last 168 hours. Diabetic: Recent Labs    12/01/22 0541  HGBA1C 6.8*   Recent Labs  Lab 12/01/22 1148 12/01/22 1542 12/01/22 1702 12/01/22 2126 12/02/22 0721  GLUCAP 149* 135* 137* 159* 196*   Cardiac Enzymes: No results for input(s): "CKTOTAL", "CKMB", "CKMBINDEX",  "TROPONINI" in the last 168 hours. No results for input(s): "PROBNP" in the last 8760 hours. Coagulation Profile: No results for input(s): "INR", "PROTIME" in the last 168 hours. Thyroid Function Tests: No results for input(s): "TSH", "T4TOTAL", "FREET4", "T3FREE", "THYROIDAB" in the last 72 hours. Lipid Profile: No results for input(s): "CHOL", "HDL", "LDLCALC", "TRIG", "CHOLHDL", "LDLDIRECT" in the last 72 hours. Anemia Panel: No results for input(s): "VITAMINB12", "FOLATE", "FERRITIN", "TIBC", "IRON", "RETICCTPCT" in the last 72 hours. Urine analysis:    Component Value Date/Time   COLORURINE YELLOW 11/30/2022 1426   APPEARANCEUR CLEAR 11/30/2022 1426   LABSPEC 1.012 11/30/2022 1426   PHURINE 7.0 11/30/2022 1426   GLUCOSEU NEGATIVE 11/30/2022 1426   GLUCOSEU >=1000 (A) 04/12/2022 1020   HGBUR NEGATIVE 11/30/2022 1426   BILIRUBINUR NEGATIVE 11/30/2022 1426   KETONESUR NEGATIVE 11/30/2022 1426   PROTEINUR NEGATIVE 11/30/2022 1426   UROBILINOGEN 0.2 04/12/2022 1020   NITRITE NEGATIVE 11/30/2022 1426   LEUKOCYTESUR NEGATIVE 11/30/2022 1426   Sepsis Labs: Invalid input(s): "PROCALCITONIN", "LACTICIDVEN"  Microbiology: Recent Results (from the past 240 hour(s))  Blood culture (routine x 2)     Status: None (Preliminary result)   Collection Time: 11/30/22  2:26 PM   Specimen: BLOOD  Result Value Ref Range Status   Specimen Description BLOOD RIGHT ANTECUBITAL  Final   Special Requests   Final    BOTTLES DRAWN AEROBIC AND ANAEROBIC Blood Culture adequate volume   Culture   Final    NO GROWTH 2 DAYS Performed at [facility], [address], [place], [place] [address]    Report Status PENDING  Incomplete  Blood culture (routine x 2)     Status: None (Preliminary result)   Collection Time: 11/30/22  4:03 PM   Specimen: BLOOD  Result Value Ref Range Status   Specimen Description BLOOD BLOOD LEFT FOREARM  Final   Special Requests   Final    BOTTLES DRAWN AEROBIC AND ANAEROBIC  Blood Culture adequate volume   Culture   Final    NO GROWTH 2 DAYS Performed at [facility], [address] [place], [place] [address]    Report Status PENDING  Incomplete  SARS Coronavirus 2 by RT PCR (hospital order, performed in [facility] hospital lab) *cepheid single result test* Anterior Nasal Swab     Status: None   Collection Time: 11/30/22  5:13 PM   Specimen: Anterior Nasal Swab  Result Value Ref Range Status   SARS Coronavirus 2 by RT PCR NEGATIVE NEGATIVE Final    Comment: (NOTE) SARS-CoV-2 target nucleic acids are NOT DETECTED.  The SARS-CoV-2 RNA is generally detectable in upper and lower respiratory specimens during the acute phase of infection. The lowest concentration of SARS-CoV-2 viral copies this assay can detect is 250 copies / mL. A negative result does not preclude SARS-CoV-2 infection and should not be used as the sole basis for treatment or other patient management decisions.  A negative result may occur with improper specimen collection / handling, submission of specimen other than nasopharyngeal swab, presence of viral mutation(s) within the areas targeted by this assay, and inadequate number of viral copies (<250 copies / mL). A negative result must be combined with clinical observations, patient history, and epidemiological information.  Fact Sheet for Patients:   RoadLapTop.co.za  Fact Sheet for Healthcare Providers: http://kim-miller.com/  This test is not yet approved or  cleared by the [place] FDA and has been authorized for detection and/or diagnosis of SARS-CoV-2 by FDA under an Emergency Use Authorization (EUA).  This EUA will remain in effect (meaning this test can be used) for the duration of the COVID-19 declaration under Section 564(b)(1) of the Act, 21 U.S.C. section 360bbb-3(b)(1), unless the authorization is terminated or revoked sooner.  Performed at [facility], [address] [place], [place] [address]   Respiratory (~20 pathogens) panel by PCR     Status: None   Collection Time: 11/30/22  5:13 PM   Specimen: Nasopharyngeal Swab; Respiratory  Result Value Ref Range Status   Adenovirus NOT DETECTED NOT DETECTED Final   Coronavirus 229E NOT DETECTED NOT DETECTED Final    Comment: (NOTE) The Coronavirus on the Respiratory Panel, DOES NOT test for the novel  Coronavirus (2019 nCoV)    Coronavirus HKU1 NOT DETECTED NOT DETECTED Final   Coronavirus NL63 NOT DETECTED NOT DETECTED Final   Coronavirus OC43 NOT DETECTED NOT DETECTED Final   Metapneumovirus NOT DETECTED NOT DETECTED Final   Rhinovirus / Enterovirus NOT DETECTED NOT DETECTED Final   Influenza A NOT DETECTED NOT DETECTED Final   Influenza B NOT DETECTED NOT DETECTED Final   Parainfluenza Virus 1 NOT DETECTED NOT DETECTED Final   Parainfluenza Virus 2 NOT DETECTED NOT DETECTED Final   Parainfluenza Virus 3 NOT DETECTED NOT DETECTED Final   Parainfluenza Virus 4 NOT DETECTED NOT DETECTED Final   Respiratory Syncytial Virus NOT DETECTED NOT DETECTED Final   Bordetella pertussis NOT DETECTED NOT DETECTED Final   Bordetella Parapertussis NOT DETECTED NOT DETECTED Final   Chlamydophila pneumoniae NOT DETECTED NOT DETECTED Final   Mycoplasma pneumoniae NOT DETECTED NOT DETECTED Final    Comment: Performed at [facility], [address], [place], [place] [address]  MRSA Next Gen by PCR, Nasal     Status: Abnormal   Collection Time: 11/30/22  8:05 PM   Specimen: Nasal Mucosa; Nasal Swab  Result Value Ref Range Status   MRSA by PCR Next Gen DETECTED (A) NOT DETECTED Final    Comment: RESULT CALLED TO, READ BACK BY AND VERIFIED WITH: S. BHATTARAI AT 0122 ON 05.24.24 BY ADGER J         The GeneXpert MRSA  Assay (FDA approved for NASAL specimens only), is one component of a comprehensive MRSA colonization surveillance program. It is not intended to diagnose MRSA infection nor to guide or monitor  treatment for MRSA infections. Performed at [facility], [address], [place], [place] [address]   Culture, Respiratory w Gram Stain     Status: None (Preliminary result)   Collection Time: 12/01/22  1:17 PM   Specimen: Expectorated Sputum; Respiratory  Result Value Ref Range Status   Specimen Description EXPECTORATED SPUTUM  Final   Special Requests NONE  Final   Gram Stain   Final    NO WBC SEEN FEW GRAM POSITIVE COCCI RARE BUDDING YEAST SEEN Performed at [facility], [address], [place], [place] [address]    Culture PENDING  Incomplete   Report Status PENDING  Incomplete  Expectorated Sputum Assessment w Gram Stain, Rflx to Resp Cult     Status: None   Collection Time: 12/01/22  1:17 PM   Specimen: Sputum  Result Value Ref Range Status   Specimen Description SPU  Final   Special Requests NONE  Final   Sputum evaluation   Final    THIS SPECIMEN IS ACCEPTABLE FOR SPUTUM CULTURE Performed at [facility], [address], [place], [place] [address]    Report Status 12/01/2022 FINAL  Final    Radiology Studies: DG Chest Port 1 View  Result Date: 12/02/2022 CLINICAL DATA:  Shortness of breath. Congestion. Evaluate for pneumonia. Pulmonary edema. EXAM: PORTABLE CHEST 1 VIEW COMPARISON:  Chest radiographs 12/01/2022, 11/30/2022, 08/08/2022; CT chest 11/30/2022 FINDINGS: Cardiac silhouette and mediastinal contours are within normal limits. No significant change in left basilar heterogeneous airspace opacity corresponding to the high-grade left lower lobe consolidation seen on 11/30/2022 CT. Mild chronic bilateral interstitial thickening is similar to multiple prior radiographs and appears chronic, possibly scarring. No pneumothorax. No definite pleural effusion. No acute skeletal abnormality. IMPRESSION: No significant change in left basilar heterogeneous airspace opacity corresponding to the high-grade left lower lobe consolidation seen on 11/30/2022 CT. This is again  concerning for pneumonia or aspiration. Electronically Signed   By: Neita Garnet M.D.   On: 12/02/2022 09:31   ECHOCARDIOGRAM COMPLETE  Result Date: 12/01/2022    ECHOCARDIOGRAM REPORT   Patient Name:   Travis Beck [facility] Date of Exam: 12/01/2022 Medical Rec #:  [id number]             Height:       65.0 in Accession #:    [id number]            Weight:       141.5 lb Date of Birth:  [birth year]              BSA:          1.708 m Patient Age:    76 years              BP:           109/62 mmHg Patient Gender: M                     HR:           88 bpm. Exam Location:  Inpatient Procedure: 2D Echo, Cardiac Doppler and Color Doppler Indications:    SBE I33.9  History:        Patient has no prior history of Echocardiogram examinations.  COPD; Risk Factors:Hypertension and Diabetes.  Sonographer:    Lucendia Herrlich Referring Phys: [phone] SEYED A SHAHMEHDI IMPRESSIONS  1. Left ventricular ejection fraction, by estimation, is 60 to 65%. The left ventricle has normal function. The left ventricle has no regional wall motion abnormalities. Left ventricular diastolic parameters were normal.  2. Right ventricular systolic function is normal. The right ventricular size is normal.  3. Left atrial size was mildly dilated.  4. The mitral valve is normal in structure. No evidence of mitral valve regurgitation. No evidence of mitral stenosis.  5. The aortic valve is normal in structure. Aortic valve regurgitation is not visualized. No aortic stenosis is present.  6. The inferior vena cava is dilated in size with >50% respiratory variability, suggesting right atrial pressure of 8 mmHg. FINDINGS  Left Ventricle: Left ventricular ejection fraction, by estimation, is 60 to 65%. The left ventricle has normal function. The left ventricle has no regional wall motion abnormalities. The left ventricular internal cavity size was normal in size. There is  no left ventricular hypertrophy. Left ventricular diastolic parameters  were normal. Right Ventricle: The right ventricular size is normal. No increase in right ventricular wall thickness. Right ventricular systolic function is normal. Left Atrium: Left atrial size was mildly dilated. Right Atrium: Right atrial size was normal in size. Pericardium: There is no evidence of pericardial effusion. Mitral Valve: The mitral valve is normal in structure. No evidence of mitral valve regurgitation. No evidence of mitral valve stenosis. Tricuspid Valve: The tricuspid valve is normal in structure. Tricuspid valve regurgitation is not demonstrated. No evidence of tricuspid stenosis. Aortic Valve: The aortic valve is normal in structure. Aortic valve regurgitation is not visualized. No aortic stenosis is present. Aortic valve peak gradient measures 7.3 mmHg. Pulmonic Valve: The pulmonic valve was normal in structure. Pulmonic valve regurgitation is not visualized. No evidence of pulmonic stenosis. Aorta: The aortic root is normal in size and structure. Venous: The inferior vena cava is dilated in size with greater than 50% respiratory variability, suggesting right atrial pressure of 8 mmHg. IAS/Shunts: No atrial level shunt detected by color flow Doppler.  LEFT VENTRICLE PLAX 2D LVIDd:         4.90 cm   Diastology LVIDs:         3.20 cm   LV e' medial:    7.62 cm/s LV PW:         1.20 cm   LV E/e' medial:  8.6 LV IVS:        0.90 cm   LV e' lateral:   11.90 cm/s LVOT diam:     1.90 cm   LV E/e' lateral: 5.5 LV SV:         51 LV SV Index:   30 LVOT Area:     2.84 cm                           3D Volume EF:                          3D EF:        61 %                          LV EDV:       133 ml  LV ESV:       52 ml                          LV SV:        81 ml RIGHT VENTRICLE             IVC RV S prime:     17.10 cm/s  IVC diam: 2.20 cm TAPSE (M-mode): 1.6 cm LEFT ATRIUM           Index        RIGHT ATRIUM           Index LA diam:      4.60 cm 2.69 cm/m   RA Area:     15.40  cm LA Vol (A2C): 67.7 ml 39.64 ml/m  RA Volume:   35.50 ml  20.79 ml/m LA Vol (A4C): 65.1 ml 38.12 ml/m  AORTIC VALVE AV Area (Vmax): 2.27 cm AV Vmax:        135.00 cm/s AV Peak Grad:   7.3 mmHg LVOT Vmax:      108.00 cm/s LVOT Vmean:     69.600 cm/s LVOT VTI:       0.179 m  AORTA Ao Root diam: 3.70 cm Ao Asc diam:  3.30 cm MITRAL VALVE MV Area (PHT): 3.85 cm    SHUNTS MV Decel Time: 197 msec    Systemic VTI:  0.18 m MV E velocity: 65.50 cm/s  Systemic Diam: 1.90 cm MV A velocity: 90.70 cm/s MV E/A ratio:  0.72 Aditya Sabharwal Electronically signed by Dorthula Nettles Signature Date/Time: 12/01/2022/4:18:07 PM    Final       Giovoni Bunch T. Marissa Lowrey [facility]  If 7PM-7AM, please contact night-coverage [email or web address] 12/02/2022, 11:29 AM

## 2022-12-03 DIAGNOSIS — J189 Pneumonia, unspecified organism: Secondary | ICD-10-CM | POA: Diagnosis not present

## 2022-12-03 DIAGNOSIS — J441 Chronic obstructive pulmonary disease with (acute) exacerbation: Secondary | ICD-10-CM | POA: Diagnosis not present

## 2022-12-03 DIAGNOSIS — K2100 Gastro-esophageal reflux disease with esophagitis, without bleeding: Secondary | ICD-10-CM | POA: Diagnosis not present

## 2022-12-03 DIAGNOSIS — D649 Anemia, unspecified: Secondary | ICD-10-CM

## 2022-12-03 DIAGNOSIS — J9601 Acute respiratory failure with hypoxia: Secondary | ICD-10-CM | POA: Diagnosis not present

## 2022-12-03 LAB — COMPREHENSIVE METABOLIC PANEL
ALT: 13 U/L (ref 0–44)
AST: 11 U/L — ABNORMAL LOW (ref 15–41)
Albumin: 2.3 g/dL — ABNORMAL LOW (ref 3.5–5.0)
Alkaline Phosphatase: 44 U/L (ref 38–126)
Anion gap: 8 (ref 5–15)
BUN: 18 mg/dL (ref 8–23)
CO2: 25 mmol/L (ref 22–32)
Calcium: 8.3 mg/dL — ABNORMAL LOW (ref 8.9–10.3)
Chloride: 99 mmol/L (ref 98–111)
Creatinine, Ser: 0.89 mg/dL (ref 0.61–1.24)
GFR, Estimated: >60 mL/min (ref >60)
Glucose, Bld: 248 mg/dL — ABNORMAL HIGH (ref 70–99)
Potassium: 4.4 mmol/L (ref 3.5–5.1)
Sodium: 132 mmol/L — ABNORMAL LOW (ref 135–145)
Total Bilirubin: 0.8 mg/dL (ref 0.3–1.2)
Total Protein: 5.5 g/dL — ABNORMAL LOW (ref 6.5–8.1)

## 2022-12-03 LAB — CULTURE, BLOOD (ROUTINE X 2)
Culture: NO GROWTH
Special Requests: ADEQUATE

## 2022-12-03 LAB — CBC
HCT: 32.3 % — ABNORMAL LOW (ref 39.0–52.0)
Hemoglobin: 10.9 g/dL — ABNORMAL LOW (ref 13.0–17.0)
MCH: 29.5 pg (ref 26.0–34.0)
MCHC: 33.7 g/dL (ref 30.0–36.0)
MCV: 87.3 fL (ref 80.0–100.0)
Platelets: 235 10*3/uL (ref 150–400)
RBC: 3.7 MIL/uL — ABNORMAL LOW (ref 4.22–5.81)
RDW: 12.8 % (ref 11.5–15.5)
WBC: 10.1 10*3/uL (ref 4.0–10.5)
nRBC: 0 % (ref 0.0–0.2)

## 2022-12-03 LAB — GLUCOSE, CAPILLARY
Glucose-Capillary: 288 mg/dL — ABNORMAL HIGH (ref 70–99)
Glucose-Capillary: 418 mg/dL — ABNORMAL HIGH (ref 70–99)
Glucose-Capillary: 295 mg/dL — ABNORMAL HIGH (ref 70–99)

## 2022-12-03 LAB — MAGNESIUM: Magnesium: 1.9 mg/dL (ref 1.7–2.4)

## 2022-12-03 MED ORDER — AMOXICILLIN-POT CLAVULANATE 875-125 MG PO TABS: 1.0000 | ORAL_TABLET | Freq: Two times a day (BID) | ORAL | 0 refills | Status: DC

## 2022-12-03 MED ORDER — INSULIN ASPART 100 UNIT/ML IJ SOLN
6.0000 [IU] | Freq: Three times a day (TID) | INTRAMUSCULAR | Status: DC
  Administered 2022-12-03: 6 [IU] via SUBCUTANEOUS

## 2022-12-03 MED ORDER — INSULIN ASPART 100 UNIT/ML IJ SOLN
3.0000 [IU] | Freq: Three times a day (TID) | INTRAMUSCULAR | Status: DC
  Administered 2022-12-03: 3 [IU] via SUBCUTANEOUS

## 2022-12-03 MED ORDER — DOXYCYCLINE HYCLATE 100 MG PO CAPS: 100.0000 mg | ORAL_CAPSULE | Freq: Two times a day (BID) | ORAL | 0 refills | Status: DC

## 2022-12-03 NOTE — Discharge Summary (Signed)
Physician Discharge Summary  Travis Beck XBJ:[id number] DOB: February 09, 1946 DOA: 11/30/2022  PCP: Kirstie Peri, MD  Admit date: 11/30/2022 Discharge date: 12/03/2022 Admitted From: Home Disposition: Home Recommendations for Outpatient Follow-up:  Follow up with PCP in 1 to 2 weeks Check CMP and CBC at follow-up Patient is at risk for polypharmacy on alprazolam, gabapentin, Subutex, Mirapex and cetirizine.  Please review Please follow up on the following pending results: Respiratory culture  Home Health: Not indicated Equipment/Devices: Not indicated  Discharge Condition: Stable CODE STATUS: Full code  Follow-up Information     Kirstie Peri, MD. Schedule an appointment as soon as possible for a visit in 1 week(s).   Specialty: Internal Medicine Contact information: [address] [place] [address] [phone]                 Hospital course 77 year old M with PMH of COPD, DM-2, HTN, HLD, chronic pain and anxiety presented to [facility] ED on 5/23 with 1 to 2-week history of increased SOB and left-sided back pain that is worse with deep breathing and admitted to ICU due to septic shock with acute hypoxic respiratory failure in the setting of LLL and RLL pneumonia.  Reportedly desaturated to 82% on RA and started on BiPAP.  He was hypotensive requiring vasopressor. He also had mild AKI and lactic acidosis.     Eventually, patient came off vasopressors and transferred to [facility] hospitalist service on 5/25.  Since then, he was liberated off oxygen and maintained appropriate saturation.  Evaluated by therapy and felt to be at baseline.  He has received vancomycin, ceftriaxone and Zithromax for 3 days.  He is discharged on p.o. Augmentin and doxycycline for 2 more days to complete treatment course.  Respiratory culture pending at time of discharge.  Patient is at risk for polypharmacy on alprazolam, gabapentin, Subutex, Mirapex and saturating.  Recommend reviewing these medications at  follow-up.  See individual problem list below for more.   Problems addressed during this hospitalization Principal Problem:   Acute respiratory failure with hypoxia (HCC) Active Problems:   GERD (gastroesophageal reflux disease)   Acute exacerbation of COPD with asthma (HCC)   Anemia, unspecified   Multifocal pneumonia   Dysuria   Septic shock in the setting of multilobar PNA: POA.  CXR with LLL and RLL infiltrate.  Required vasopressor on admission.  Sepsis physiology resolved.  MRSA PCR screen positive.  Blood cultures negative.  Sepsis physiology resolved.  Respiratory culture pending.  Received vancomycin, ceftriaxone and Zithromax for 3 days.  Discharged on p.o. Augmentin and doxycycline for 2 more days.   Acute hypoxemic respiratory failure: Due to the above.  Resolved.   COPD with emphysema: Former smoker.  Followed by Dr. Isaiah Serge as an outpatient -Continue triple therapy -Discontinue steroid. -Antibiotics as above.   Controlled NIDDM-2 with hyperglycemia and HLD: A1c 6.8%.  Hyperglycemia due to steroid.  On metformin and glipizide at home. Recent Labs  Lab 12/02/22 1637 12/02/22 2106 12/03/22 0356 12/03/22 0758 12/03/22 1209  GLUCAP 192* 101* 288* 418* 295*  -Steroid stopped -Continue home glipizide and metformin -Encourage good hydration  AKI: Due to septic shock.  Resolved.   Essential hypertension: Normotensive off home antihypertensive meds. -Continue home medication   Back pain, L-sided - pleuritic versus MSK, suspect this is more likely r/t PNA.  Has history of chronic back pain.  CT renal stone study negative for nephrolithiasis or hydronephrosis but multilevel DDD.  -Continue home Subutex, gabapentin and Tylenol   Anxiety: Stable -  Continue home Xanax PRN   Skin cancer on scalp -Outpatient follow-up with dermatology   At risk for polypharmacy -Review medications at follow-up.    Time spent 45 minutes  Vital signs Vitals:   12/02/22 2350 12/03/22  0459 12/03/22 0705 12/03/22 1207  BP: 127/79 137/68  119/77  Pulse: 71 74  69  Temp: (!) 97.4 F (36.3 C) (!) 97.5 F (36.4 C)  97.9 F (36.6 C)  Resp: (!) 23 17  15   Height:      Weight:      SpO2: 93% 93% 93%   TempSrc: Oral Oral  Oral  BMI (Calculated):         Discharge exam  GENERAL: No apparent distress.  Nontoxic. HEENT: MMM.  Vision and hearing grossly intact.  NECK: Supple.  No apparent JVD.  RESP:  No IWOB.  Fair aeration bilaterally. CVS:  RRR. Heart sounds normal.  ABD/GI/GU: BS+. Abd soft, NTND.  MSK/EXT:  Moves extremities. No apparent deformity. No edema.  SKIN: no apparent skin lesion or wound NEURO: Awake and alert. Oriented appropriately.  No apparent focal neuro deficit. PSYCH: Calm. Normal affect.   Discharge Instructions Discharge Instructions     Diet - low sodium heart healthy   Complete by: As directed    Diet Carb Modified   Complete by: As directed    Discharge instructions   Complete by: As directed    It has been a pleasure taking care of you!  You were hospitalized due to pneumonia for which you have been treated with antibiotics.  Your symptoms improved.  We are discharging you more antibiotics to complete treatment course.  Please review your new medication list and the directions on your medications before you take them.  Note that combination of alprazolam, gabapentin, Subutex, Mirapex and cetirizine can increase your risk of drowsiness, sedation, impaired judgment and risk of fall.  We strongly recommend talking to your doctors who prescribe them. We do not recommend driving, operating machinery or other activity that requires similar mental and physical engagement while taking combination of these medications.   Follow-up with your primary care doctor in 1 to 2 weeks.    Take care,   Increase activity slowly   Complete by: As directed       Allergies as of 12/03/2022       Reactions   Penicillins Rash   No problems with  ampicillin during hospitalization        Medication List     STOP taking these medications    IBU 800 MG tablet Generic drug: ibuprofen   rosuvastatin 20 MG tablet Commonly known as: CRESTOR       TAKE these medications    acetaminophen 325 MG tablet Commonly known as: TYLENOL Take 2 tablets (650 mg total) by mouth every 6 (six) hours as needed for mild pain (or Fever >/= 101).   albuterol 108 (90 Base) MCG/ACT inhaler Commonly known as: VENTOLIN HFA Inhale 2 puffs into the lungs every 4 (four) hours as needed for wheezing or shortness of breath.   ALPRAZolam 1 MG tablet Commonly known as: XANAX Take 1 tablet (1 mg total) by mouth 3 (three) times daily as needed for anxiety.   amLODipine 5 MG tablet Commonly known as: NORVASC Take 5 mg by mouth at bedtime. What changed: Another medication with the same name was removed. Continue taking this medication, and follow the directions you see here.   amoxicillin-clavulanate 875-125 MG tablet Commonly known as: AUGMENTIN  Take 1 tablet by mouth 2 (two) times daily for 2 days.   aspirin EC 81 MG tablet Take 1 tablet (81 mg total) by mouth daily. Swallow whole.   buprenorphine 8 MG Subl SL tablet Commonly known as: SUBUTEX Place 4-8 mg under the tongue See admin instructions. 8 mg during the day, 4 mg at bedtime.   cetirizine 10 MG tablet Commonly known as: ZYRTEC Take 10 mg by mouth as needed for allergies (itching).   doxycycline 100 MG capsule Commonly known as: VIBRAMYCIN Take 1 capsule (100 mg total) by mouth 2 (two) times daily for 2 days.   fluticasone 50 MCG/ACT nasal spray Commonly known as: FLONASE Place 2 sprays into both nostrils daily.   gabapentin 400 MG capsule Commonly known as: NEURONTIN Take 1 capsule (400 mg total) by mouth every 8 (eight) hours as needed. What changed: reasons to take this   glipiZIDE 10 MG 24 hr tablet Commonly known as: GLUCOTROL XL Take 10 mg by mouth daily.    metFORMIN 1000 MG tablet Commonly known as: GLUCOPHAGE Take 1,000 mg by mouth 2 (two) times daily with a meal.   montelukast 10 MG tablet Commonly known as: SINGULAIR Take 1 tablet (10 mg total) by mouth at bedtime.   pantoprazole 40 MG tablet Commonly known as: PROTONIX Take 1 tablet (40 mg total) by mouth daily.   pramipexole 0.5 MG tablet Commonly known as: MIRAPEX Take 0.5 mg by mouth at bedtime.   pravastatin 20 MG tablet Commonly known as: PRAVACHOL Take 20 mg by mouth every evening.   Trelegy Ellipta 100-62.5-25 MCG/ACT Aepb Generic drug: Fluticasone-Umeclidin-Vilant Inhale 1 puff into the lungs daily. What changed: Another medication with the same name was removed. Continue taking this medication, and follow the directions you see here.   valsartan 160 MG tablet Commonly known as: DIOVAN Take 160 mg by mouth daily. What changed: Another medication with the same name was removed. Continue taking this medication, and follow the directions you see here.        Consultations: Pulmonology admitted patient  Procedures/Studies:   DG Chest Port 1 View  Result Date: 12/02/2022 CLINICAL DATA:  Shortness of breath. Congestion. Evaluate for pneumonia. Pulmonary edema. EXAM: PORTABLE CHEST 1 VIEW COMPARISON:  Chest radiographs 12/01/2022, 11/30/2022, 08/08/2022; CT chest 11/30/2022 FINDINGS: Cardiac silhouette and mediastinal contours are within normal limits. No significant change in left basilar heterogeneous airspace opacity corresponding to the high-grade left lower lobe consolidation seen on 11/30/2022 CT. Mild chronic bilateral interstitial thickening is similar to multiple prior radiographs and appears chronic, possibly scarring. No pneumothorax. No definite pleural effusion. No acute skeletal abnormality. IMPRESSION: No significant change in left basilar heterogeneous airspace opacity corresponding to the high-grade left lower lobe consolidation seen on 11/30/2022 CT.  This is again concerning for pneumonia or aspiration. Electronically Signed   By: Neita Garnet M.D.   On: 12/02/2022 09:31   ECHOCARDIOGRAM COMPLETE  Result Date: 12/01/2022    ECHOCARDIOGRAM REPORT   Patient Name:   Travis Beck Date of Exam: 12/01/2022 Medical Rec #:  [id number]             Height:       65.0 in Accession #:    [id number]            Weight:       141.5 lb Date of Birth:  [birth year]              BSA:  1.708 m Patient Age:    27 years              BP:           109/62 mmHg Patient Gender: M                     HR:           88 bpm. Exam Location:  Inpatient Procedure: 2D Echo, Cardiac Doppler and Color Doppler Indications:    SBE I33.9  History:        Patient has no prior history of Echocardiogram examinations.                 COPD; Risk Factors:Hypertension and Diabetes.  Sonographer:    Lucendia Herrlich Referring Phys: [phone] SEYED A SHAHMEHDI IMPRESSIONS  1. Left ventricular ejection fraction, by estimation, is 60 to 65%. The left ventricle has normal function. The left ventricle has no regional wall motion abnormalities. Left ventricular diastolic parameters were normal.  2. Right ventricular systolic function is normal. The right ventricular size is normal.  3. Left atrial size was mildly dilated.  4. The mitral valve is normal in structure. No evidence of mitral valve regurgitation. No evidence of mitral stenosis.  5. The aortic valve is normal in structure. Aortic valve regurgitation is not visualized. No aortic stenosis is present.  6. The inferior vena cava is dilated in size with >50% respiratory variability, suggesting right atrial pressure of 8 mmHg. FINDINGS  Left Ventricle: Left ventricular ejection fraction, by estimation, is 60 to 65%. The left ventricle has normal function. The left ventricle has no regional wall motion abnormalities. The left ventricular internal cavity size was normal in size. There is  no left ventricular hypertrophy. Left ventricular  diastolic parameters were normal. Right Ventricle: The right ventricular size is normal. No increase in right ventricular wall thickness. Right ventricular systolic function is normal. Left Atrium: Left atrial size was mildly dilated. Right Atrium: Right atrial size was normal in size. Pericardium: There is no evidence of pericardial effusion. Mitral Valve: The mitral valve is normal in structure. No evidence of mitral valve regurgitation. No evidence of mitral valve stenosis. Tricuspid Valve: The tricuspid valve is normal in structure. Tricuspid valve regurgitation is not demonstrated. No evidence of tricuspid stenosis. Aortic Valve: The aortic valve is normal in structure. Aortic valve regurgitation is not visualized. No aortic stenosis is present. Aortic valve peak gradient measures 7.3 mmHg. Pulmonic Valve: The pulmonic valve was normal in structure. Pulmonic valve regurgitation is not visualized. No evidence of pulmonic stenosis. Aorta: The aortic root is normal in size and structure. Venous: The inferior vena cava is dilated in size with greater than 50% respiratory variability, suggesting right atrial pressure of 8 mmHg. IAS/Shunts: No atrial level shunt detected by color flow Doppler.  LEFT VENTRICLE PLAX 2D LVIDd:         4.90 cm   Diastology LVIDs:         3.20 cm   LV e' medial:    7.62 cm/s LV PW:         1.20 cm   LV E/e' medial:  8.6 LV IVS:        0.90 cm   LV e' lateral:   11.90 cm/s LVOT diam:     1.90 cm   LV E/e' lateral: 5.5 LV SV:         51 LV SV Index:   30 LVOT Area:  2.84 cm                           3D Volume EF:                          3D EF:        61 %                          LV EDV:       133 ml                          LV ESV:       52 ml                          LV SV:        81 ml RIGHT VENTRICLE             IVC RV S prime:     17.10 cm/s  IVC diam: 2.20 cm TAPSE (M-mode): 1.6 cm LEFT ATRIUM           Index        RIGHT ATRIUM           Index LA diam:      4.60 cm 2.69 cm/m    RA Area:     15.40 cm LA Vol (A2C): 67.7 ml 39.64 ml/m  RA Volume:   35.50 ml  20.79 ml/m LA Vol (A4C): 65.1 ml 38.12 ml/m  AORTIC VALVE AV Area (Vmax): 2.27 cm AV Vmax:        135.00 cm/s AV Peak Grad:   7.3 mmHg LVOT Vmax:      108.00 cm/s LVOT Vmean:     69.600 cm/s LVOT VTI:       0.179 m  AORTA Ao Root diam: 3.70 cm Ao Asc diam:  3.30 cm MITRAL VALVE MV Area (PHT): 3.85 cm    SHUNTS MV Decel Time: 197 msec    Systemic VTI:  0.18 m MV E velocity: 65.50 cm/s  Systemic Diam: 1.90 cm MV A velocity: 90.70 cm/s MV E/A ratio:  0.72 Aditya Sabharwal Electronically signed by Dorthula Nettles Signature Date/Time: 12/01/2022/4:18:07 PM    Final    DG CHEST PORT 1 VIEW  Result Date: 12/01/2022 CLINICAL DATA:  Shortness of breath. EXAM: PORTABLE CHEST 1 VIEW COMPARISON:  11/30/2022 FINDINGS: Slightly enlarged central vascular and interstitial lung markings. Findings concerning for at least mild pulmonary edema. Again noted is consolidation at the left lung base. Heart and mediastinum are grossly stable. Trachea remains midline. Negative for a pneumothorax. IMPRESSION: 1. Persistent consolidation and airspace disease at the left lung base. 2. Concern for mild pulmonary edema. Electronically Signed   By: Richarda Overlie M.D.   On: 12/01/2022 14:25   CT Angio Chest Pulmonary Embolism (PE) W or WO Contrast  Result Date: 11/30/2022 CLINICAL DATA:  Hypoxia EXAM: CT ANGIOGRAPHY CHEST WITH CONTRAST TECHNIQUE: Multidetector CT imaging of the chest was performed using the standard protocol during bolus administration of intravenous contrast. Multiplanar CT image reconstructions and MIPs were obtained to evaluate the vascular anatomy. RADIATION DOSE REDUCTION: This exam was performed according to the departmental dose-optimization program which includes automated exposure control, adjustment of the mA and/or kV according to patient size and/or use of iterative reconstruction technique. CONTRAST:  75mL OMNIPAQUE IOHEXOL 350  MG/ML SOLN COMPARISON:  09/23/2020 FINDINGS: Despite efforts by the technologist and patient, motion artifact is present on today's exam and could not be eliminated. This reduces exam sensitivity and specificity. Cardiovascular: No filling defect is identified in the pulmonary arterial tree to suggest pulmonary embolus. Coronary, aortic arch, and branch vessel atherosclerotic vascular disease. Mediastinum/Nodes: 1.2 cm right hilar lymph node. 1.0 cm left infrahilar lymph node. 1.2 cm subcarinal lymph node. Small type 1 hiatal hernia.  Mildly dilated distal esophagus. Lungs/Pleura: Consolidation of most of the left lower lobe aside from the superior segment. Substantial airspace opacity posteriorly in the right lower lobe, especially in the posterior basal segment. Appearance suspicious for multilobar pneumonia or aspiration pneumonitis. Patchy reticulonodular opacities are present in both upper lobes along with some bandlike atelectasis at the lung apices. Benign 3 mm calcified granuloma noted in the left upper lobe on image 66 series 6. Upper Abdomen: Abdominal aortic atherosclerosis. Separate origin of the splenic artery from the rest of the celiac trunk; atheromatous calcification at the origins of the upper abdominal aortic branches without appreciable occlusion. Musculoskeletal: Sternoclavicular arthropathy bilaterally. Review of the MIP images confirms the above findings. IMPRESSION: 1. No filling defect is identified in the pulmonary arterial tree to suggest pulmonary embolus. To motion artifact. Reduced sensitivity due 2. Consolidation of most of the left lower lobe aside from the superior segment. Substantial airspace opacity posteriorly in the right lower lobe, especially in the posterior basal segment. Appearance suspicious for multilobar pneumonia or aspiration pneumonitis. 3. Patchy reticulonodular opacities are present in both upper lobes along with some bandlike atelectasis at the lung apices. 4. Mild  bilateral hilar and subcarinal adenopathy, probably reactive. 5. Small type 1 hiatal hernia. Mildly dilated distal esophagus. 6. Aortic atherosclerosis. Aortic Atherosclerosis (ICD10-I70.0). Electronically Signed   By: Gaylyn Rong M.D.   On: 11/30/2022 18:00   CT Renal Stone Study  Result Date: 11/30/2022 CLINICAL DATA:  Abdominal/flank pain, stone suspected.  Weakening EXAM: CT ABDOMEN AND PELVIS WITHOUT CONTRAST TECHNIQUE: Multidetector CT imaging of the abdomen and pelvis was performed following the standard protocol without IV contrast. RADIATION DOSE REDUCTION: This exam was performed according to the departmental dose-optimization program which includes automated exposure control, adjustment of the mA and/or kV according to patient size and/or use of iterative reconstruction technique. COMPARISON:  Chest radiograph performed earlier on the same date. FINDINGS: Lower chest: Bilateral lower lobe consolidations left greater than the right with air bronchograms concerning for bilateral pneumonia. Prominent atherosclerotic calcification of coronary arteries. Hepatobiliary: No focal liver abnormality is seen. No gallstones, gallbladder wall thickening, or biliary dilatation. Pancreas: Generalized pancreatic atrophy no pancreatic ductal dilatation or surrounding inflammatory changes. Spleen: Normal in size without focal abnormality. Adrenals/Urinary Tract: Adrenal glands are unremarkable. Kidneys are normal, without renal calculi, focal lesion, or hydronephrosis. Exophytic bilateral renal cysts which contains mildly complex fluid. The interpolar right renal cyst measures a proximally 2.2 x 2.7 cm and in the lower pole of the left kidney measures a proximally 2.4 x 2.3 cm. Foley's catheter in the urinary bladder with trace amount of air likely iatrogenic. Stomach/Bowel: Stomach is within normal limits. Appendix appears normal. No evidence of bowel wall thickening, distention, or inflammatory changes.  Sigmoid colonic diverticulosis without evidence of acute diverticulitis. Vascular/Lymphatic: Aortic atherosclerosis. No enlarged abdominal or pelvic lymph nodes. Reproductive: Prostate is unremarkable. Other: No abdominal wall hernia or abnormality. No abdominopelvic ascites. Musculoskeletal: Multilevel degenerate disc disease of the lumbar spine with associated facet joint  arthropathy. No acute osseous abnormality. IMPRESSION: 1. Bilateral lower lobe consolidations left greater than the right with air bronchograms concerning for bilateral pneumonia. 2. No evidence of nephrolithiasis or hydronephrosis. 3. Sigmoid colonic diverticulosis without evidence of acute diverticulitis. 4. Aortic atherosclerosis and coronary artery calcification. 5. 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 No acute osseous abnormality. Aortic Atherosclerosis (ICD10-I70.0). Electronically Signed   By: Larose Hires D.O.   On: 11/30/2022 16:19   DG Chest Port 1 View  Result Date: 11/30/2022 CLINICAL DATA:  hypoxia EXAM: PORTABLE CHEST - 1 VIEW COMPARISON:  08/08/2022 FINDINGS: Relatively low lung volumes. New consolidation/atelectasis at the left lung base obscuring the diaphragmatic leaflet. Mild scattered perihilar of the lower opacities left greater than right. Heart size and mediastinal contours are within normal limits. No effusion. Visualized bones unremarkable. IMPRESSION: New left lower lobe consolidation/atelectasis. Electronically Signed   By: Corlis Leak M.D.   On: 11/30/2022 14:54       The results of significant diagnostics from this hospitalization (including imaging, microbiology, ancillary and laboratory) are listed below for reference.     Microbiology: Recent Results (from the past 240 hour(s))  Blood culture (routine x 2)     Status: None (Preliminary result)   Collection Time: 11/30/22  2:26 PM   Specimen: BLOOD  Result Value Ref Range Status   Specimen  Description BLOOD RIGHT ANTECUBITAL  Final   Special Requests   Final    BOTTLES DRAWN AEROBIC AND ANAEROBIC Blood Culture adequate volume   Culture   Final    NO GROWTH 3 DAYS Performed at [facility] [address], [place], [place] [address]    Report Status PENDING  Incomplete  Blood culture (routine x 2)     Status: None (Preliminary result)   Collection Time: 11/30/22  4:03 PM   Specimen: BLOOD  Result Value Ref Range Status   Specimen Description BLOOD BLOOD LEFT FOREARM  Final   Special Requests   Final    BOTTLES DRAWN AEROBIC AND ANAEROBIC Blood Culture adequate volume   Culture   Final    NO GROWTH 3 DAYS Performed at [facility], [address], [place], [place] [address]    Report Status PENDING  Incomplete  SARS Coronavirus 2 by RT PCR (hospital order, performed in [facility] hospital lab) *cepheid single result test* Anterior Nasal Swab     Status: None   Collection Time: 11/30/22  5:13 PM   Specimen: Anterior Nasal Swab  Result Value Ref Range Status   SARS Coronavirus 2 by RT PCR NEGATIVE NEGATIVE Final    Comment: (NOTE) SARS-CoV-2 target nucleic acids are NOT DETECTED.  The SARS-CoV-2 RNA is generally detectable in upper and lower respiratory specimens during the acute phase of infection. The lowest concentration of SARS-CoV-2 viral copies this assay can detect is 250 copies / mL. A negative result does not preclude SARS-CoV-2 infection and should not be used as the sole basis for treatment or other patient management decisions.  A negative result may occur with improper specimen collection / handling, submission of specimen other than nasopharyngeal swab, presence of viral mutation(s) within the areas targeted by this assay, and inadequate number of viral copies (<250 copies / mL). A negative result must be combined with clinical observations, patient history, and epidemiological information.  Fact Sheet for Patients:    RoadLapTop.co.za  Fact Sheet for Healthcare Providers: http://kim-miller.com/  This test is not yet approved or  cleared by  the [employer] and has been authorized for detection and/or diagnosis of SARS-CoV-2 by FDA under an Emergency Use Authorization (EUA).  This EUA will remain in effect (meaning this test can be used) for the duration of the COVID-19 declaration under Section 564(b)(1) of the Act, 21 U.S.C. section 360bbb-3(b)(1), unless the authorization is terminated or revoked sooner.  Performed at [facility], [address] [place], [place] [address]   Respiratory (~20 pathogens) panel by PCR     Status: None   Collection Time: 11/30/22  5:13 PM   Specimen: Nasopharyngeal Swab; Respiratory  Result Value Ref Range Status   Adenovirus NOT DETECTED NOT DETECTED Final   Coronavirus 229E NOT DETECTED NOT DETECTED Final    Comment: (NOTE) The Coronavirus on the Respiratory Panel, DOES NOT test for the novel  Coronavirus (2019 nCoV)    Coronavirus HKU1 NOT DETECTED NOT DETECTED Final   Coronavirus NL63 NOT DETECTED NOT DETECTED Final   Coronavirus OC43 NOT DETECTED NOT DETECTED Final   Metapneumovirus NOT DETECTED NOT DETECTED Final   Rhinovirus / Enterovirus NOT DETECTED NOT DETECTED Final   Influenza A NOT DETECTED NOT DETECTED Final   Influenza B NOT DETECTED NOT DETECTED Final   Parainfluenza Virus 1 NOT DETECTED NOT DETECTED Final   Parainfluenza Virus 2 NOT DETECTED NOT DETECTED Final   Parainfluenza Virus 3 NOT DETECTED NOT DETECTED Final   Parainfluenza Virus 4 NOT DETECTED NOT DETECTED Final   Respiratory Syncytial Virus NOT DETECTED NOT DETECTED Final   Bordetella pertussis NOT DETECTED NOT DETECTED Final   Bordetella Parapertussis NOT DETECTED NOT DETECTED Final   Chlamydophila pneumoniae NOT DETECTED NOT DETECTED Final   Mycoplasma pneumoniae NOT DETECTED NOT DETECTED Final    Comment: Performed at [facility], [address], [place], [place] [address]  MRSA Next Gen by PCR, Nasal     Status: Abnormal   Collection Time: 11/30/22  8:05 PM   Specimen: Nasal Mucosa; Nasal Swab  Result Value Ref Range Status   MRSA by PCR Next Gen DETECTED (A) NOT DETECTED Final    Comment: RESULT CALLED TO, READ BACK BY AND VERIFIED WITH: S. BHATTARAI AT 0122 ON 05.24.24 BY ADGER J         The GeneXpert MRSA Assay (FDA approved for NASAL specimens only), is one component of a comprehensive MRSA colonization surveillance program. It is not intended to diagnose MRSA infection nor to guide or monitor treatment for MRSA infections. Performed at [facility], [address], [place], [place] [address]   Culture, Respiratory w Gram Stain     Status: None (Preliminary result)   Collection Time: 12/01/22  1:17 PM   Specimen: Expectorated Sputum; Respiratory  Result Value Ref Range Status   Specimen Description EXPECTORATED SPUTUM  Final   Special Requests NONE  Final   Gram Stain   Final    NO WBC SEEN FEW GRAM POSITIVE COCCI RARE BUDDING YEAST SEEN    Culture   Final    CULTURE REINCUBATED FOR BETTER GROWTH Performed at [facility], [address], [place], [place] [address]    Report Status PENDING  Incomplete  Expectorated Sputum Assessment w Gram Stain, Rflx to Resp Cult     Status: None   Collection Time: 12/01/22  1:17 PM   Specimen: Sputum  Result Value Ref Range Status   Specimen Description SPU  Final   Special Requests NONE  Final   Sputum evaluation   Final  THIS SPECIMEN IS ACCEPTABLE FOR SPUTUM CULTURE Performed at [facility], [address] [place], [place] [address]    Report Status 12/01/2022 FINAL  Final     Labs:  CBC: Recent Labs  Lab 11/30/22 1426 12/01/22 0541 12/02/22 0034 12/03/22 0258  WBC 7.3 7.7 7.3 10.1  NEUTROABS 6.0  --   --   --   HGB 13.6 12.8* 11.4* 10.9*  HCT 41.9 38.6* 34.1* 32.3*  MCV 91.7 91.5 89.0 87.3  PLT 284 247 202 235    BMP &GFR Recent Labs  Lab 11/30/22 1426 12/01/22 0541 12/02/22 0034 12/03/22 0258  NA 131* 135 133* 132*  K 3.9 3.9 3.7 4.4  CL 94* 105 100 99  CO2 24 19* 21* 25  GLUCOSE 194* 227* 183* 248*  BUN 24* 22 16 18   CREATININE 1.36* 1.14 0.85 0.89  CALCIUM 9.1 8.3* 8.2* 8.3*  MG  --   --  1.5* 1.9   Estimated Creatinine Clearance: 59.5 mL/min (by C-G formula based on SCr of 0.89 mg/dL). Liver & Pancreas: Recent Labs  Lab 11/30/22 1426 12/02/22 0034 12/03/22 0258  AST 18 16 11*  ALT 15 13 13   ALKPHOS 59 37* 44  BILITOT 1.5* 0.8 0.8  PROT 7.7 5.3* 5.5*  ALBUMIN 4.3 2.5* 2.3*   No results for input(s): "LIPASE", "AMYLASE" in the last 168 hours. No results for input(s): "AMMONIA" in the last 168 hours. Diabetic: Recent Labs    12/01/22 0541  HGBA1C 6.8*   Recent Labs  Lab 12/02/22 1637 12/02/22 2106 12/03/22 0356 12/03/22 0758 12/03/22 1209  GLUCAP 192* 101* 288* 418* 295*   Cardiac Enzymes: No results for input(s): "CKTOTAL", "CKMB", "CKMBINDEX", "TROPONINI" in the last 168 hours. No results for input(s): "PROBNP" in the last 8760 hours. Coagulation Profile: No results for input(s): "INR", "PROTIME" in the last 168 hours. Thyroid Function Tests: No results for input(s): "TSH", "T4TOTAL", "FREET4", "T3FREE", "THYROIDAB" in the last 72 hours. Lipid Profile: No results for input(s): "CHOL", "HDL", "LDLCALC", "TRIG", "CHOLHDL", "LDLDIRECT" in the last 72 hours. Anemia Panel: No results for input(s): "VITAMINB12", "FOLATE", "FERRITIN", "TIBC", "IRON", "RETICCTPCT" in the last 72 hours. Urine analysis:    Component Value Date/Time   COLORURINE YELLOW 11/30/2022 1426   APPEARANCEUR CLEAR 11/30/2022 1426   LABSPEC 1.012 11/30/2022 1426   PHURINE 7.0 11/30/2022 1426   GLUCOSEU NEGATIVE 11/30/2022 1426   GLUCOSEU >=1000 (A) 04/12/2022 1020   HGBUR NEGATIVE 11/30/2022 1426   BILIRUBINUR NEGATIVE 11/30/2022 1426   KETONESUR NEGATIVE 11/30/2022 1426   PROTEINUR  NEGATIVE 11/30/2022 1426   UROBILINOGEN 0.2 04/12/2022 1020   NITRITE NEGATIVE 11/30/2022 1426   LEUKOCYTESUR NEGATIVE 11/30/2022 1426   Sepsis Labs: Invalid input(s): "PROCALCITONIN", "LACTICIDVEN"   SIGNED:  Almon Hercules, MD  [facility] 12/03/2022, 12:11 PM

## 2022-12-03 NOTE — TOC Transition Note (Signed)
Transition of Care [facility]) - CM/SW Discharge Note   Patient Details  Name: Travis Beck MRN: [id number] Date of Birth: 03/05/1946  [facility]) CM/SW Contact:  Gordy Clement, RN Phone Number: 12/03/2022, 8:41 AM   Clinical Narrative:    Patient will dc to home where he lives with his Brother- Brother is blind and Patient is his caregiver. There are two ladies that come into the home to help with PCS services. Sister will transport home.   His FSBS was high this am per Nurse so DC will be delayed until later after it comes down.   No additional dc needs at this time       Barriers to Discharge: Continued Medical Work up   Patient Goals and CMS Choice      Discharge Placement                         Discharge Plan and Services Additional resources added to the After Visit Summary for                                       Social Determinants of Health (SDOH) Interventions SDOH Screenings   Food Insecurity: No Food Insecurity (11/30/2022)  Housing: Patient Declined (11/30/2022)  Transportation Needs: No Transportation Needs (11/30/2022)  Utilities: Not At Risk (11/30/2022)  Financial Resource Strain: Low Risk  (09/27/2022)  Physical Activity: Inactive (07/31/2022)  Tobacco Use: Medium Risk (11/30/2022)     Readmission Risk Interventions     No data to display

## 2022-12-04 ENCOUNTER — Other Ambulatory Visit: Payer: Self-pay | Admitting: Student

## 2022-12-04 DIAGNOSIS — J189 Pneumonia, unspecified organism: Secondary | ICD-10-CM

## 2022-12-04 LAB — CULTURE, RESPIRATORY W GRAM STAIN: Gram Stain: NONE SEEN

## 2022-12-04 LAB — LEGIONELLA PNEUMOPHILA SEROGP 1 UR AG: L. pneumophila Serogp 1 Ur Ag: NEGATIVE

## 2022-12-04 MED ORDER — DOXYCYCLINE HYCLATE 100 MG PO CAPS
100.0000 mg | ORAL_CAPSULE | Freq: Two times a day (BID) | ORAL | 0 refills | Status: AC
Start: 2022-12-04 — End: 2022-12-06

## 2022-12-04 MED ORDER — AMOXICILLIN-POT CLAVULANATE 875-125 MG PO TABS
1.0000 | ORAL_TABLET | Freq: Two times a day (BID) | ORAL | 0 refills | Status: DC
Start: 2022-12-04 — End: 2022-12-04

## 2022-12-04 MED ORDER — DOXYCYCLINE HYCLATE 100 MG PO CAPS
100.0000 mg | ORAL_CAPSULE | Freq: Two times a day (BID) | ORAL | 0 refills | Status: DC
Start: 2022-12-04 — End: 2022-12-04

## 2022-12-04 MED ORDER — AMOXICILLIN-POT CLAVULANATE 875-125 MG PO TABS
1.0000 | ORAL_TABLET | Freq: Two times a day (BID) | ORAL | 0 refills | Status: AC
Start: 2022-12-04 — End: 2022-12-06

## 2022-12-04 NOTE — Progress Notes (Signed)
"  Pt was dc'd yesterday and family member called requesting his 2 abx to be sent to [facility] 10a-[facility] bc the [facility] today and he didn't pick them up yesterday (her own fault, she "didn't even think of it")"  [facility]in [facility] [place] as requested

## 2022-12-04 NOTE — Progress Notes (Signed)
Rx resent.

## 2022-12-05 LAB — CULTURE, BLOOD (ROUTINE X 2)

## 2022-12-07 ENCOUNTER — Emergency Department (HOSPITAL_COMMUNITY): Payer: Medicare Other

## 2022-12-07 ENCOUNTER — Encounter (HOSPITAL_COMMUNITY): Payer: Self-pay

## 2022-12-07 ENCOUNTER — Inpatient Hospital Stay (HOSPITAL_COMMUNITY)
Admission: EM | Admit: 2022-12-07 | Discharge: 2022-12-13 | DRG: 177 | Disposition: A | Discharge status: Home Health | Payer: Medicare Other | Attending: Internal Medicine | Admitting: Internal Medicine

## 2022-12-07 ENCOUNTER — Other Ambulatory Visit: Payer: Self-pay

## 2022-12-07 DIAGNOSIS — J168 Pneumonia due to other specified infectious organisms: Secondary | ICD-10-CM | POA: Diagnosis not present

## 2022-12-07 DIAGNOSIS — F419 Anxiety disorder, unspecified: Secondary | ICD-10-CM | POA: Diagnosis present

## 2022-12-07 DIAGNOSIS — I10 Essential (primary) hypertension: Secondary | ICD-10-CM | POA: Diagnosis not present

## 2022-12-07 DIAGNOSIS — Y95 Nosocomial condition: Secondary | ICD-10-CM | POA: Diagnosis present

## 2022-12-07 DIAGNOSIS — E222 Syndrome of inappropriate secretion of antidiuretic hormone: Secondary | ICD-10-CM | POA: Diagnosis present

## 2022-12-07 DIAGNOSIS — J439 Emphysema, unspecified: Secondary | ICD-10-CM | POA: Diagnosis present

## 2022-12-07 DIAGNOSIS — Z7982 Long term (current) use of aspirin: Secondary | ICD-10-CM

## 2022-12-07 DIAGNOSIS — R0602 Shortness of breath: Secondary | ICD-10-CM | POA: Diagnosis not present

## 2022-12-07 DIAGNOSIS — Z87891 Personal history of nicotine dependence: Secondary | ICD-10-CM | POA: Diagnosis not present

## 2022-12-07 DIAGNOSIS — J181 Lobar pneumonia, unspecified organism: Secondary | ICD-10-CM | POA: Diagnosis not present

## 2022-12-07 DIAGNOSIS — J15212 Pneumonia due to Methicillin resistant Staphylococcus aureus: Secondary | ICD-10-CM | POA: Diagnosis present

## 2022-12-07 DIAGNOSIS — E119 Type 2 diabetes mellitus without complications: Secondary | ICD-10-CM | POA: Diagnosis present

## 2022-12-07 DIAGNOSIS — E876 Hypokalemia: Secondary | ICD-10-CM | POA: Diagnosis not present

## 2022-12-07 DIAGNOSIS — E785 Hyperlipidemia, unspecified: Secondary | ICD-10-CM | POA: Diagnosis present

## 2022-12-07 DIAGNOSIS — R5381 Other malaise: Secondary | ICD-10-CM | POA: Diagnosis present

## 2022-12-07 DIAGNOSIS — J690 Pneumonitis due to inhalation of food and vomit: Secondary | ICD-10-CM | POA: Diagnosis present

## 2022-12-07 DIAGNOSIS — G894 Chronic pain syndrome: Secondary | ICD-10-CM | POA: Diagnosis present

## 2022-12-07 DIAGNOSIS — E1169 Type 2 diabetes mellitus with other specified complication: Secondary | ICD-10-CM | POA: Diagnosis not present

## 2022-12-07 DIAGNOSIS — J189 Pneumonia, unspecified organism: Principal | ICD-10-CM

## 2022-12-07 DIAGNOSIS — J440 Chronic obstructive pulmonary disease with acute lower respiratory infection: Secondary | ICD-10-CM | POA: Diagnosis present

## 2022-12-07 DIAGNOSIS — Z7984 Long term (current) use of oral hypoglycemic drugs: Secondary | ICD-10-CM

## 2022-12-07 DIAGNOSIS — Z1152 Encounter for screening for COVID-19: Secondary | ICD-10-CM | POA: Diagnosis not present

## 2022-12-07 DIAGNOSIS — Z7401 Bed confinement status: Secondary | ICD-10-CM

## 2022-12-07 DIAGNOSIS — Z79899 Other long term (current) drug therapy: Secondary | ICD-10-CM

## 2022-12-07 DIAGNOSIS — J869 Pyothorax without fistula: Secondary | ICD-10-CM | POA: Diagnosis present

## 2022-12-07 DIAGNOSIS — J90 Pleural effusion, not elsewhere classified: Secondary | ICD-10-CM | POA: Diagnosis not present

## 2022-12-07 DIAGNOSIS — Z880 Allergy status to penicillin: Secondary | ICD-10-CM | POA: Diagnosis not present

## 2022-12-07 DIAGNOSIS — R918 Other nonspecific abnormal finding of lung field: Secondary | ICD-10-CM | POA: Diagnosis not present

## 2022-12-07 LAB — CBC WITH DIFFERENTIAL/PLATELET
Abs Immature Granulocytes: 0.17 10*3/uL — ABNORMAL HIGH (ref 0.00–0.07)
Basophils Absolute: 0.1 10*3/uL (ref 0.0–0.1)
Basophils Relative: 1 %
Eosinophils Absolute: 0.2 10*3/uL (ref 0.0–0.5)
Eosinophils Relative: 2 %
HCT: 33.4 % — ABNORMAL LOW (ref 39.0–52.0)
Hemoglobin: 10.9 g/dL — ABNORMAL LOW (ref 13.0–17.0)
Immature Granulocytes: 1 %
Lymphocytes Relative: 9 %
Lymphs Abs: 1.1 10*3/uL (ref 0.7–4.0)
MCH: 29.6 pg (ref 26.0–34.0)
MCHC: 32.6 g/dL (ref 30.0–36.0)
MCV: 90.8 fL (ref 80.0–100.0)
Monocytes Absolute: 0.8 10*3/uL (ref 0.1–1.0)
Monocytes Relative: 6 %
Neutro Abs: 10.3 10*3/uL — ABNORMAL HIGH (ref 1.7–7.7)
Neutrophils Relative %: 81 %
Platelets: 338 10*3/uL (ref 150–400)
RBC: 3.68 MIL/uL — ABNORMAL LOW (ref 4.22–5.81)
RDW: 13.2 % (ref 11.5–15.5)
WBC: 12.6 10*3/uL — ABNORMAL HIGH (ref 4.0–10.5)
nRBC: 0 % (ref 0.0–0.2)

## 2022-12-07 LAB — SARS CORONAVIRUS 2 BY RT PCR: SARS Coronavirus 2 by RT PCR: NEGATIVE

## 2022-12-07 LAB — BASIC METABOLIC PANEL
Anion gap: 13 (ref 5–15)
BUN: 14 mg/dL (ref 8–23)
CO2: 27 mmol/L (ref 22–32)
Calcium: 8.2 mg/dL — ABNORMAL LOW (ref 8.9–10.3)
Chloride: 91 mmol/L — ABNORMAL LOW (ref 98–111)
Creatinine, Ser: 0.82 mg/dL (ref 0.61–1.24)
GFR, Estimated: >60 mL/min (ref >60)
Glucose, Bld: 170 mg/dL — ABNORMAL HIGH (ref 70–99)
Potassium: 3.3 mmol/L — ABNORMAL LOW (ref 3.5–5.1)
Sodium: 131 mmol/L — ABNORMAL LOW (ref 135–145)

## 2022-12-07 LAB — GLUCOSE, CAPILLARY: Glucose-Capillary: 189 mg/dL — ABNORMAL HIGH (ref 70–99)

## 2022-12-07 LAB — PROCALCITONIN: Procalcitonin: 0.83 ng/mL

## 2022-12-07 LAB — CBG MONITORING, ED: Glucose-Capillary: 177 mg/dL — ABNORMAL HIGH (ref 70–99)

## 2022-12-07 LAB — BRAIN NATRIURETIC PEPTIDE: B Natriuretic Peptide: 99 pg/mL (ref 0.0–100.0)

## 2022-12-07 MED ORDER — VANCOMYCIN HCL 1250 MG/250ML IV SOLN
1250.0000 mg | Freq: Once | INTRAVENOUS | Status: AC
  Administered 2022-12-07: 1250 mg via INTRAVENOUS
  Filled 2022-12-07: qty 250

## 2022-12-07 MED ORDER — SODIUM CHLORIDE 0.9 % IV SOLN
1.0000 g | INTRAVENOUS | Status: DC
  Administered 2022-12-08: 1 g via INTRAVENOUS
  Filled 2022-12-07: qty 10

## 2022-12-07 MED ORDER — ACETAMINOPHEN 650 MG RE SUPP: 650.0000 mg | Freq: Four times a day (QID) | RECTAL | Status: DC | PRN

## 2022-12-07 MED ORDER — INSULIN ASPART 100 UNIT/ML IJ SOLN
0.0000 [IU] | Freq: Every day | INTRAMUSCULAR | Status: DC
  Administered 2022-12-08: 2 [IU] via SUBCUTANEOUS

## 2022-12-07 MED ORDER — ENOXAPARIN SODIUM 40 MG/0.4ML IJ SOSY
40.0000 mg | PREFILLED_SYRINGE | INTRAMUSCULAR | Status: DC
  Administered 2022-12-07 – 2022-12-12 (×6): 40 mg via SUBCUTANEOUS
  Filled 2022-12-07 (×6): qty 0.4

## 2022-12-07 MED ORDER — SODIUM CHLORIDE 0.9 % IV SOLN
2.0000 g | Freq: Once | INTRAVENOUS | Status: AC
  Administered 2022-12-07: 2 g via INTRAVENOUS
  Filled 2022-12-07: qty 12.5

## 2022-12-07 MED ORDER — ALBUTEROL SULFATE (2.5 MG/3ML) 0.083% IN NEBU
2.5000 mg | INHALATION_SOLUTION | Freq: Two times a day (BID) | RESPIRATORY_TRACT | Status: DC
  Administered 2022-12-08 – 2022-12-10 (×5): 2.5 mg via RESPIRATORY_TRACT
  Filled 2022-12-07 (×5): qty 3

## 2022-12-07 MED ORDER — INSULIN ASPART 100 UNIT/ML IJ SOLN
0.0000 [IU] | Freq: Three times a day (TID) | INTRAMUSCULAR | Status: DC
  Administered 2022-12-08: 3 [IU] via SUBCUTANEOUS
  Administered 2022-12-08: 1 [IU] via SUBCUTANEOUS
  Administered 2022-12-08 – 2022-12-09 (×2): 3 [IU] via SUBCUTANEOUS
  Administered 2022-12-09: 2 [IU] via SUBCUTANEOUS
  Administered 2022-12-09: 3 [IU] via SUBCUTANEOUS
  Administered 2022-12-10: 2 [IU] via SUBCUTANEOUS
  Administered 2022-12-10: 3 [IU] via SUBCUTANEOUS
  Administered 2022-12-10: 1 [IU] via SUBCUTANEOUS
  Administered 2022-12-11: 3 [IU] via SUBCUTANEOUS
  Administered 2022-12-11: 5 [IU] via SUBCUTANEOUS
  Administered 2022-12-11 – 2022-12-12 (×2): 1 [IU] via SUBCUTANEOUS
  Administered 2022-12-12 (×2): 2 [IU] via SUBCUTANEOUS
  Administered 2022-12-13: 3 [IU] via SUBCUTANEOUS
  Administered 2022-12-13: 2 [IU] via SUBCUTANEOUS

## 2022-12-07 MED ORDER — HYDRALAZINE HCL 20 MG/ML IJ SOLN: 10.0000 mg | Freq: Four times a day (QID) | INTRAMUSCULAR | Status: DC | PRN

## 2022-12-07 MED ORDER — GUAIFENESIN ER 600 MG PO TB12
600.0000 mg | ORAL_TABLET | Freq: Two times a day (BID) | ORAL | Status: DC
  Administered 2022-12-07 – 2022-12-13 (×12): 600 mg via ORAL
  Filled 2022-12-07 (×12): qty 1

## 2022-12-07 MED ORDER — SODIUM CHLORIDE 0.9 % IV SOLN
500.0000 mg | INTRAVENOUS | Status: DC
  Administered 2022-12-08: 500 mg via INTRAVENOUS
  Filled 2022-12-07: qty 5

## 2022-12-07 MED ORDER — ACETAMINOPHEN 325 MG PO TABS
650.0000 mg | ORAL_TABLET | Freq: Four times a day (QID) | ORAL | Status: DC | PRN
  Administered 2022-12-12 – 2022-12-13 (×2): 650 mg via ORAL
  Filled 2022-12-07 (×2): qty 2

## 2022-12-07 MED ORDER — LORAZEPAM 2 MG/ML IJ SOLN
0.5000 mg | Freq: Once | INTRAMUSCULAR | Status: AC
  Administered 2022-12-07: 0.5 mg via INTRAVENOUS
  Filled 2022-12-07: qty 1

## 2022-12-07 MED ORDER — POTASSIUM CHLORIDE CRYS ER 20 MEQ PO TBCR
40.0000 meq | EXTENDED_RELEASE_TABLET | Freq: Once | ORAL | Status: AC
  Administered 2022-12-07: 40 meq via ORAL
  Filled 2022-12-07: qty 2

## 2022-12-07 MED ORDER — SENNOSIDES-DOCUSATE SODIUM 8.6-50 MG PO TABS
1.0000 | ORAL_TABLET | Freq: Every day | ORAL | Status: DC
  Administered 2022-12-07 – 2022-12-12 (×6): 1 via ORAL
  Filled 2022-12-07 (×6): qty 1

## 2022-12-07 MED ORDER — IPRATROPIUM-ALBUTEROL 0.5-2.5 (3) MG/3ML IN SOLN: 3.0000 mL | Freq: Four times a day (QID) | RESPIRATORY_TRACT | Status: DC | PRN

## 2022-12-07 MED ORDER — SODIUM CHLORIDE 0.9 % IV BOLUS
1000.0000 mL | Freq: Once | INTRAVENOUS | Status: AC
  Administered 2022-12-07: 1000 mL via INTRAVENOUS

## 2022-12-07 MED ORDER — POLYETHYLENE GLYCOL 3350 17 G PO PACK
17.0000 g | PACK | Freq: Every day | ORAL | Status: DC
  Administered 2022-12-07 – 2022-12-12 (×4): 17 g via ORAL
  Filled 2022-12-07 (×6): qty 1

## 2022-12-07 MED ORDER — ALBUTEROL SULFATE (2.5 MG/3ML) 0.083% IN NEBU
2.5000 mg | INHALATION_SOLUTION | Freq: Four times a day (QID) | RESPIRATORY_TRACT | Status: DC
  Administered 2022-12-07: 2.5 mg via RESPIRATORY_TRACT
  Filled 2022-12-07: qty 3

## 2022-12-07 MED ORDER — TRAZODONE HCL 50 MG PO TABS
50.0000 mg | ORAL_TABLET | Freq: Every evening | ORAL | Status: DC | PRN
  Administered 2022-12-08: 50 mg via ORAL
  Filled 2022-12-07: qty 1

## 2022-12-07 NOTE — ED Triage Notes (Signed)
PT arrives POV with a c/o of having a fever, left sided pain and a cough. PT states that he was recently diagnosed with a bad case of pneumonia and was hospitalized last week.

## 2022-12-07 NOTE — H&P (Addendum)
[facility] History and Physical  Travis Beck ZOX:[id number] DOB: [birth year] DOA: 12/07/2022 PCP: Kirstie Peri, MD  Admitted from: home Chief Complaint: persistent dyspnea, cough, fever  History of Present Illness: Travis Beck is a 77 y.o. male with PMH significant for DM2, COPD, HTN, HLD, chronic pain, anxiety who was recently hospitalized 5/23 to 5/26 in ICU for septic shock, respiratory failure secondary to bilateral lower lobe pneumonia.  Eventually improved, liberated off oxygen and discharged home to complete antibiotic course with oral Augmentin and doxycycline.  He completed a total of 5 days of antibiotics. Patient reports that post discharge, his left-sided chest pain never improved.  He continued to have persistent cough, remained weak and mostly remained bedbound.  He also had a fever up to 101 last night.  He was seen by home health nurse today, looks sick and was hence sent back to ED.  In the ED, patient was afebrile, heart rate in 70s, blood pressure 138/67, breathing on room air Labs with WC count 12.6, hemoglobin 10.9, sodium 131, potassium 3.3, blood sugar 177 Chest x-ray showed improved aeration of the lung bases with persistent consolidation in the left lower lobe. Trace bilateral pleural effusions.  When compared with the CT chest she had during last admission on 5/23, pneumonia seems overall improving.  Given patient's symptoms, report of fever and weakness, patient did not feel comfortable discharging home. EDP started the patient on broad-spectrum IV antibiotics Hospitalist service was consulted for observation overnight.  At the time of my evaluation, patient was propped up in bed.  Not on supplemental oxygen.  Left sided chest pain but still there but better.  Continues to feel weak. Constipated for the last 3 to 4 days. No family at bedside  Review of Systems:  All systems were reviewed and were negative unless otherwise mentioned in the  HPI   Past medical history: Past Medical History:  Diagnosis Date   Arthritis    Asthma    Childhood   Emphysema lung (HCC)    Essential hypertension    Ischemic heart disease    Myoview 2020 indicating inferior infarct scar with mild peri-infarct ischemia - managed medically   Type 2 diabetes mellitus (HCC)     Past surgical history: Past Surgical History:  Procedure Laterality Date   BIOPSY  08/30/2018   Procedure: BIOPSY;  Surgeon: Malissa Hippo, MD;  [facility];  Service: Endoscopy;;  gastric    BIOPSY  11/05/2020   Procedure: BIOPSY;  Surgeon: Dolores Frame, MD;  [facility];  Service: Gastroenterology;;   COLONOSCOPY     COLONOSCOPY WITH PROPOFOL N/A 11/05/2020   Procedure: COLONOSCOPY WITH PROPOFOL;  Surgeon: Dolores Frame, MD;  [facility];  Service: Gastroenterology;  Laterality: N/A;  Am   ESOPHAGEAL DILATION N/A 05/21/2019   Procedure: ESOPHAGEAL DILATION;  Surgeon: Malissa Hippo, MD;  [facility];  Service: Endoscopy;  Laterality: N/A;   ESOPHAGOGASTRODUODENOSCOPY N/A 05/21/2019   Procedure: ESOPHAGOGASTRODUODENOSCOPY (EGD);  Surgeon: Malissa Hippo, MD;  [facility];  Service: Endoscopy;  Laterality: N/A;  730   ESOPHAGOGASTRODUODENOSCOPY (EGD) WITH PROPOFOL N/A 08/30/2018   Procedure: ESOPHAGOGASTRODUODENOSCOPY (EGD) WITH PROPOFOL;  Surgeon: Malissa Hippo, MD;  [facility];  Service: Endoscopy;  Laterality: N/A;  1:30   ESOPHAGOGASTRODUODENOSCOPY (EGD) WITH PROPOFOL N/A 11/05/2020   Procedure: ESOPHAGOGASTRODUODENOSCOPY (EGD) WITH PROPOFOL;  Surgeon: Dolores Frame, MD;  [facility];  Service: Gastroenterology;  Laterality: N/A;   HERNIA REPAIR     bilateral lower abdomen   POLYPECTOMY  11/05/2020   Procedure: POLYPECTOMY;  Surgeon: Dolores Frame, MD;  [facility];  Service: Gastroenterology;;   Gaspar Bidding DILATION   11/05/2020   Procedure: Gaspar Bidding DILATION;  Surgeon: Marguerita Merles, Reuel Boom, MD;  [facility];  Service: Gastroenterology;;    Social History:  reports that he quit smoking about 29 years ago. His smoking use included cigarettes. He has a 66.00 pack-year smoking history. He has never used smokeless tobacco. He reports that he does not currently use alcohol. He reports that he does not use drugs.  Allergies:  Allergies  Allergen Reactions   Penicillins Rash    No problems with ampicillin during hospitalization   Penicillins   Family history:  History reviewed. No pertinent family history.   Home Meds: Prior to Admission medications   Medication Sig Start Date End Date Taking? Authorizing Provider  acetaminophen (TYLENOL) 325 MG tablet Take 2 tablets (650 mg total) by mouth every 6 (six) hours as needed for mild pain (or Fever >/= 101). 11/26/19  Yes Emokpae, Courage, MD  albuterol (VENTOLIN HFA) 108 (90 Base) MCG/ACT inhaler Inhale 2 puffs into the lungs every 4 (four) hours as needed for wheezing or shortness of breath. 11/26/19  Yes Emokpae, Courage, MD  ALPRAZolam Prudy Feeler) 1 MG tablet Take 1 tablet (1 mg total) by mouth 3 (three) times daily as needed for anxiety. 01/28/19  Yes Welborn, Ryan, DO  amLODipine (NORVASC) 5 MG tablet Take 5 mg by mouth at bedtime. 11/06/22  Yes [provider]  cetirizine (ZYRTEC) 10 MG tablet Take 10 mg by mouth as needed for allergies (itching).   Yes [provider]  fluticasone (FLONASE) 50 MCG/ACT nasal spray Place 2 sprays into both nostrils daily.  08/22/18  Yes [provider]  gabapentin (NEURONTIN) 400 MG capsule Take 1 capsule (400 mg total) by mouth every 8 (eight) hours as needed. Patient taking differently: Take 400 mg by mouth every 8 (eight) hours as needed (pain). 01/28/19  Yes Welborn, Ryan, DO  glipiZIDE (GLUCOTROL XL) 10 MG 24 hr tablet Take 10 mg by mouth daily. 11/06/22  Yes [provider]   metFORMIN (GLUCOPHAGE) 1000 MG tablet Take 1,000 mg by mouth 2 (two) times daily with a meal.  12/07/14  Yes [provider]  montelukast (SINGULAIR) 10 MG tablet Take 1 tablet (10 mg total) by mouth at bedtime. 06/15/22  Yes Mannam, Praveen, MD  pantoprazole (PROTONIX) 40 MG tablet Take 1 tablet (40 mg total) by mouth daily. 12/15/20  Yes Dolores Frame, MD  pramipexole (MIRAPEX) 0.5 MG tablet Take 0.5 mg by mouth at bedtime. 11/04/22  Yes [provider]  pravastatin (PRAVACHOL) 20 MG tablet Take 20 mg by mouth every evening.   Yes [provider]  valsartan (DIOVAN) 160 MG tablet Take 160 mg by mouth daily. 11/06/22  Yes [provider]  aspirin EC 81 MG tablet Take 1 tablet (81 mg total) by mouth daily. Swallow whole. Patient not taking: Reported on 11/30/2022 09/30/20   Jonelle Sidle, MD  buprenorphine (SUBUTEX) 8 MG SUBL SL tablet Place 4-8 mg under the tongue See admin instructions. 8 mg during the day, 4 mg at bedtime. 01/04/19   [provider]  Fluticasone-Umeclidin-Vilant (TRELEGY ELLIPTA) 100-62.5-25 MCG/ACT AEPB Inhale 1 puff into the lungs daily. Patient not taking: Reported on 12/02/2022 06/15/22   Chilton Greathouse, MD  Physical Exam: Vitals:   12/07/22 1704 12/07/22 1707 12/07/22 1715 12/07/22 1730  BP:  134/74 (!) 154/81 (!) 151/84  Pulse:  74 74 74  Resp:  18 14 12   Temp: 98 F (36.7 C) 98.5 F (36.9 C)    TempSrc: Oral Oral    SpO2:  100% 97% 93%  Weight:      Height:       Wt Readings from Last 3 Encounters:  12/07/22 64.2 kg  11/30/22 64.2 kg  08/14/22 66.8 kg   Body mass index is 23.55 kg/m.  General exam: Pleasant, elderly Caucasian male.  Not in distress Skin: No rashes, lesions or ulcers. HEENT: Atraumatic, normocephalic, no obvious bleeding Lungs: Bilateral crackles up to mid lung.  Left chest wall slightly tender to touch. CVS: Regular rate and rhythm, no murmur GI/Abd soft, no tender, mild  distention, tympanitic on percussion, bowel sound present CNS: Alert, awake, oriented x 3 Psychiatry: Mood appropriate Extremities: No pedal edema, no calf tenderness   ------------------------------------------------------------------------------------------------------ Assessment/Plan: Principal Problem:   Pneumonia  Suspect incomplete resolution of pneumonia Recent septic shock due to bibasilar pneumonia COPD Patient presented with fever, persistent cough, left-sided chest pain On chart review, it was noted that patient was recently in septic shock secondary to pneumonia but improved rapidly with antibiotics and was discharged to complete a total of 5-day course of antibiotics. Given the significance of his pneumonia, I suspect 5-day course of antibiotics with not enough and that is probably why patient continues to have symptoms. He has mildly elevated WBC count.  Obtain procalcitonin level. I would start on IV Rocephin and azithromycin Also add Mucinex, incentive spirometry, flutter valve, bronchodilators Obtain SLP eval. Check ambulatory oxygen requirement. If does not improve, may need evaluation by PCCM for bronchoscopy.  Follows up with Dr. Isaiah Serge as an outpatient. Recent Labs  Lab 11/30/22 2057 11/30/22 2335 12/01/22 0541 12/01/22 0932 12/02/22 0034 12/02/22 0047 12/03/22 0258 12/07/22 1315  WBC  --   --  7.7  --  7.3  --  10.1 12.6*  LATICACIDVEN 2.3* 2.2* 3.0* 3.4*  --  1.5  --   --   PROCALCITON  --   --  7.38  --   --   --   --   --     Type 2 diabetes mellitus A1c 6.8 on 12/01/2022 PTA on glipizide and metformin Keep oral meds on hold.  Start on sliding schedule Accu-Cheks. Recent Labs  Lab 12/02/22 2106 12/03/22 0356 12/03/22 0758 12/03/22 1209 12/07/22 1420  GLUCAP 101* 288* 418* 295* 177*   Hyponatremia His blood glucose level has been running low since last hospitalization.  Probably due to poor oral intake. Continue monitor. Recent Labs  Lab  12/01/22 0541 12/02/22 0034 12/03/22 0258 12/07/22 1315  NA 135 133* 132* 131*    Hypokalemia Potassium low at 3.3.  Replacement given in the ED.  Recheck tomorrow. Recent Labs  Lab 12/01/22 0541 12/02/22 0034 12/03/22 0258 12/07/22 1315  K 3.9 3.7 4.4 3.3*  MG  --  1.5* 1.9  --     Essential hypertension PTA on amlodipine 5 mg daily, valsartan 160 mg daily Resume the same.  Continue to monitor blood pressure.  Left-sided chest pain History of chronic back pain Reports persistent left-sided chest pain which could be pleuritic due to pneumonia.  Last admission, he had CT renal study done which was negative for stone or hydronephrosis but has multilevel DDD Patient with chronic pain and takes Subutex, gabapentin,  Mirapex and Tylenol at home. Resumed.  Counseled to minimize use of mood altering medications  Anxiety PTA, patient takes Xanax 1 g 3 times daily as needed  Generalized weakness Reports essentially bedbound for last 4 days. Obtain PT eval.  Goals of care   Code Status: Full Code.  Discussed with the patient   DVT prophylaxis: Lovenox subcu    Antimicrobials: IV Rocephin, azithromycin Fluid: None Consultants: None Family Communication: None at bedside  Dispo: The patient is from: Home              Anticipated d/c is to: Pending clinical course  Diet: Diet Order     None        ------------------------------------------------------------------------------------- Severity of Illness: The appropriate patient status for this patient is OBSERVATION. Observation status is judged to be reasonable and necessary in order to provide the required intensity of service to ensure the patient's safety. The patient's presenting symptoms, physical exam findings, and initial radiographic and laboratory data in the context of their medical condition is felt to place them at decreased risk for further clinical deterioration. Furthermore, it is anticipated that the  patient will be medically stable for discharge from the hospital within 2 midnights of admission.  -------------------------------------------------------------------------------------  Labs on Admission:   CBC: Recent Labs  Lab 12/01/22 0541 12/02/22 0034 12/03/22 0258 12/07/22 1315  WBC 7.7 7.3 10.1 12.6*  NEUTROABS  --   --   --  10.3*  HGB 12.8* 11.4* 10.9* 10.9*  HCT 38.6* 34.1* 32.3* 33.4*  MCV 91.5 89.0 87.3 90.8  PLT 247 202 235 338    Basic Metabolic Panel: Recent Labs  Lab 12/01/22 0541 12/02/22 0034 12/03/22 0258 12/07/22 1315  NA 135 133* 132* 131*  K 3.9 3.7 4.4 3.3*  CL 105 100 99 91*  CO2 19* 21* 25 27  GLUCOSE 227* 183* 248* 170*  BUN 22 16 18 14   CREATININE 1.14 0.85 0.89 0.82  CALCIUM 8.3* 8.2* 8.3* 8.2*  MG  --  1.5* 1.9  --     Liver Function Tests: Recent Labs  Lab 12/02/22 0034 12/03/22 0258  AST 16 11*  ALT 13 13  ALKPHOS 37* 44  BILITOT 0.8 0.8  PROT 5.3* 5.5*  ALBUMIN 2.5* 2.3*   No results for input(s): "LIPASE", "AMYLASE" in the last 168 hours. No results for input(s): "AMMONIA" in the last 168 hours.  Cardiac Enzymes: No results for input(s): "CKTOTAL", "CKMB", "CKMBINDEX", "TROPONINI" in the last 168 hours.  BNP (last 3 results) Recent Labs    12/07/22 1315  BNP 99.0    ProBNP (last 3 results) No results for input(s): "PROBNP" in the last 8760 hours.  CBG: Recent Labs  Lab 12/02/22 2106 12/03/22 0356 12/03/22 0758 12/03/22 1209 12/07/22 1420  GLUCAP 101* 288* 418* 295* 177*    Lipase  No results found for: "LIPASE"   Urinalysis    Component Value Date/Time   COLORURINE YELLOW 11/30/2022 1426   APPEARANCEUR CLEAR 11/30/2022 1426   LABSPEC 1.012 11/30/2022 1426   PHURINE 7.0 11/30/2022 1426   GLUCOSEU NEGATIVE 11/30/2022 1426   GLUCOSEU >=1000 (A) 04/12/2022 1020   HGBUR NEGATIVE 11/30/2022 1426   BILIRUBINUR NEGATIVE 11/30/2022 1426   KETONESUR NEGATIVE 11/30/2022 1426   PROTEINUR NEGATIVE  11/30/2022 1426   UROBILINOGEN 0.2 04/12/2022 1020   NITRITE NEGATIVE 11/30/2022 1426   LEUKOCYTESUR NEGATIVE 11/30/2022 1426     Drugs of Abuse  No results found for: "LABOPIA", "COCAINSCRNUR", "LABBENZ", "AMPHETMU", "THCU", "LABBARB"  Radiological Exams on Admission: DG Chest 2 View  Result Date: 12/07/2022 CLINICAL DATA:  Shortness of breath. EXAM: CHEST - 2 VIEW COMPARISON:  12/02/2022. FINDINGS: Improved aeration of the lung bases with persistent consolidation in the left lower lobe. Trace bilateral pleural effusions. No pneumothorax. Stable cardiac and mediastinal contours. IMPRESSION: Improved aeration of the lung bases with persistent consolidation in the left lower lobe. Trace bilateral pleural effusions. Electronically Signed   By: Orvan Falconer M.D.   On: 12/07/2022 13:42     Signed, Lorin Glass, MD [facility] 12/07/2022

## 2022-12-07 NOTE — ED Provider Triage Note (Signed)
Emergency Medicine Provider Triage Evaluation Note  Travis Beck , a 77 y.o. male  was evaluated in triage.  Pt complains of shortness of breath.  Had a recent admission to [facility] for pneumonia to [facility] and discharged on Sunday.  Since then has still had a cough at home, with a fever and feeling short of breath.  Denies chest pain.  Was evaluated today by his nurse sats at home were found to be in the mid 80s.  Review of Systems  Positive: See above Negative: See above  Physical Exam  BP 138/67 (BP Location: Right Arm)   Pulse 72   Temp 97.7 F (36.5 C) (Oral)   Ht 5\' 5"  (1.651 m)   Wt 64.2 kg   SpO2 91%   BMI 23.55 kg/m  Gen:   Awake, no distress   Resp:  Normal effort  MSK:   Moves extremities without difficulty  Other:    Medical Decision Making  Medically screening exam initiated at 1:08 PM.  Appropriate orders placed.  Travis Beck was informed that the remainder of the evaluation will be completed by another provider, this initial triage assessment does not replace that evaluation, and the importance of remaining in the ED until their evaluation is complete.  Work up started   Gareth Eagle, PA-C 12/07/22 1310

## 2022-12-07 NOTE — ED Notes (Signed)
ED TO INPATIENT HANDOFF REPORT  ED Nurse Name and Phone #: Vernona Rieger [phone]  S Name/Age/Gender Leone Brand 77 y.o. male Room/Bed: 005C/005C  Code Status   Code Status: Full Code  Home/SNF/Other Home Patient oriented to: self, place, time, and situation Is this baseline? Yes   Triage Complete: Triage complete  Chief Complaint Pneumonia [J18.9]  Triage Note PT arrives POV with a c/o of having a fever, left sided pain and a cough. PT states that he was recently diagnosed with a bad case of pneumonia and was hospitalized last week.   Allergies Allergies  Allergen Reactions   Penicillins Rash    No problems with ampicillin during hospitalization    Level of Care/Admitting Diagnosis ED Disposition     ED Disposition  Admit   Condition  --   Comment  Hospital Area: [facility] [[id number]]  Level of Care: Telemetry Medical [104]  May place patient in observation at [facility] or [facility] if equivalent level of care is available:: Yes  Covid Evaluation: Asymptomatic - no recent exposure (last 10 days) testing not required  Diagnosis: Pneumonia [227785]  Admitting Physician: Lorin Glass [[id number]]  Attending Physician: Lorin Glass [[id number]]          B Medical/Surgery History Past Medical History:  Diagnosis Date   Arthritis    Asthma    Childhood   Emphysema lung (HCC)    Essential hypertension    Ischemic heart disease    Myoview 2020 indicating inferior infarct scar with mild peri-infarct ischemia - managed medically   Type 2 diabetes mellitus (HCC)    Past Surgical History:  Procedure Laterality Date   BIOPSY  08/30/2018   Procedure: BIOPSY;  Surgeon: Malissa Hippo, MD;  [facility];  Service: Endoscopy;;  gastric    BIOPSY  11/05/2020   Procedure: BIOPSY;  Surgeon: Dolores Frame, MD;  [facility];  Service: Gastroenterology;;   COLONOSCOPY     COLONOSCOPY WITH PROPOFOL N/A 11/05/2020    Procedure: COLONOSCOPY WITH PROPOFOL;  Surgeon: Dolores Frame, MD;  [facility];  Service: Gastroenterology;  Laterality: N/A;  Am   ESOPHAGEAL DILATION N/A 05/21/2019   Procedure: ESOPHAGEAL DILATION;  Surgeon: Malissa Hippo, MD;  [facility];  Service: Endoscopy;  Laterality: N/A;   ESOPHAGOGASTRODUODENOSCOPY N/A 05/21/2019   Procedure: ESOPHAGOGASTRODUODENOSCOPY (EGD);  Surgeon: Malissa Hippo, MD;  [facility];  Service: Endoscopy;  Laterality: N/A;  730   ESOPHAGOGASTRODUODENOSCOPY (EGD) WITH PROPOFOL N/A 08/30/2018   Procedure: ESOPHAGOGASTRODUODENOSCOPY (EGD) WITH PROPOFOL;  Surgeon: Malissa Hippo, MD;  [facility];  Service: Endoscopy;  Laterality: N/A;  1:30   ESOPHAGOGASTRODUODENOSCOPY (EGD) WITH PROPOFOL N/A 11/05/2020   Procedure: ESOPHAGOGASTRODUODENOSCOPY (EGD) WITH PROPOFOL;  Surgeon: Dolores Frame, MD;  [facility];  Service: Gastroenterology;  Laterality: N/A;   HERNIA REPAIR     bilateral lower abdomen   POLYPECTOMY  11/05/2020   Procedure: POLYPECTOMY;  Surgeon: Dolores Frame, MD;  [facility];  Service: Gastroenterology;;   Gaspar Bidding DILATION  11/05/2020   Procedure: Gaspar Bidding DILATION;  Surgeon: Marguerita Merles, Reuel Boom, MD;  [facility];  Service: Gastroenterology;;     A IV Location/Drains/Wounds Patient Lines/Drains/Airways Status     Active Line/Drains/Airways     Name Placement date Placement time Site Days   Peripheral IV 12/07/22 20 G Left Antecubital 12/07/22  1717  Antecubital  less than 1  Intake/Output Last 24 hours No intake or output data in the 24 hours ending 12/07/22 1747  Labs/Imaging Results for orders placed or performed during the hospital encounter of 12/07/22 (from the past 48 hour(s))  Basic metabolic panel     Status: Abnormal   Collection Time: 12/07/22  1:15 PM  Result Value Ref Range   Sodium 131 (L)  135 - 145 mmol/L   Potassium 3.3 (L) 3.5 - 5.1 mmol/L   Chloride 91 (L) 98 - 111 mmol/L   CO2 27 22 - 32 mmol/L   Glucose, Bld 170 (H) 70 - 99 mg/dL    Comment: Glucose reference range applies only to samples taken after fasting for at least 8 hours.   BUN 14 8 - 23 mg/dL   Creatinine, Ser 6.04 0.61 - 1.24 mg/dL   Calcium 8.2 (L) 8.9 - 10.3 mg/dL   GFR, Estimated >54 >09 mL/min    Comment: (NOTE) Calculated using the CKD-EPI Creatinine Equation (2021)    Anion gap 13 5 - 15    Comment: Performed at [facility], [address], [place], [place] [address]  CBC with Differential     Status: Abnormal   Collection Time: 12/07/22  1:15 PM  Result Value Ref Range   WBC 12.6 (H) 4.0 - 10.5 K/uL   RBC 3.68 (L) 4.22 - 5.81 MIL/uL   Hemoglobin 10.9 (L) 13.0 - 17.0 g/dL   HCT 47.8 (L) 29.5 - 62.1 %   MCV 90.8 80.0 - 100.0 fL   MCH 29.6 26.0 - 34.0 pg   MCHC 32.6 30.0 - 36.0 g/dL   RDW 30.8 65.7 - 84.6 %   Platelets 338 150 - 400 K/uL   nRBC 0.0 0.0 - 0.2 %   Neutrophils Relative % 81 %   Neutro Abs 10.3 (H) 1.7 - 7.7 K/uL   Lymphocytes Relative 9 %   Lymphs Abs 1.1 0.7 - 4.0 K/uL   Monocytes Relative 6 %   Monocytes Absolute 0.8 0.1 - 1.0 K/uL   Eosinophils Relative 2 %   Eosinophils Absolute 0.2 0.0 - 0.5 K/uL   Basophils Relative 1 %   Basophils Absolute 0.1 0.0 - 0.1 K/uL   Immature Granulocytes 1 %   Abs Immature Granulocytes 0.17 (H) 0.00 - 0.07 K/uL    Comment: Performed at [facility], [address], [place], [place] [address]  Brain natriuretic peptide     Status: None   Collection Time: 12/07/22  1:15 PM  Result Value Ref Range   B Natriuretic Peptide 99.0 0.0 - 100.0 pg/mL    Comment: Performed at [facility], [address] [place], [place] [address]  CBG monitoring, ED     Status: Abnormal   Collection Time: 12/07/22  2:20 PM  Result Value Ref Range   Glucose-Capillary 177 (H) 70 - 99 mg/dL    Comment: Glucose reference range applies only to  samples taken after fasting for at least 8 hours.   DG Chest 2 View  Result Date: 12/07/2022 CLINICAL DATA:  Shortness of breath. EXAM: CHEST - 2 VIEW COMPARISON:  12/02/2022. FINDINGS: Improved aeration of the lung bases with persistent consolidation in the left lower lobe. Trace bilateral pleural effusions. No pneumothorax. Stable cardiac and mediastinal contours. IMPRESSION: Improved aeration of the lung bases with persistent consolidation in the left lower lobe. Trace bilateral pleural effusions. Electronically Signed   By: Orvan Falconer M.D.   On: 12/07/2022 13:42  Pending Labs Unresulted Labs (From admission, onward)     Start     Ordered   12/07/22 1734  Procalcitonin  Add-on,   AD       References:    Procalcitonin Lower Respiratory Tract Infection AND Sepsis Procalcitonin Algorithm   12/07/22 1733   12/07/22 1716  MRSA Next Gen by PCR, Nasal  (MRSA Screening)  Once,   URGENT        12/07/22 1716   12/07/22 1711  SARS Coronavirus 2 by RT PCR (hospital order, performed in [facility] hospital lab) *cepheid single result test* Anterior Nasal Swab  (Tier 2 - SARS Coronavirus 2 by RT PCR (hospital order, performed in [facility] hospital lab) *cepheid single result test*)  Once,   URGENT        12/07/22 1710   Signed and Held  Basic metabolic panel  Tomorrow morning,   R        Signed and Held   Signed and Held  CBC  Tomorrow morning,   R        Signed and Held   Signed and Held  Expectorated Sputum Assessment w Gram Stain, Rflx to [employer]  Once,   R        Signed and Held            Vitals/Pain Today's Vitals   12/07/22 1704 12/07/22 1707 12/07/22 1715 12/07/22 1730  BP:  134/74 (!) 154/81 (!) 151/84  Pulse:  74 74 74  Resp:  18 14 12   Temp: 98 F (36.7 C) 98.5 F (36.9 C)    TempSrc: Oral Oral    SpO2:  100% 97% 93%  Weight:      Height:      PainSc:        Isolation Precautions No active isolations  Medications Medications  ceFEPIme (MAXIPIME) 2 g in  sodium chloride 0.9 % 100 mL IVPB (2 g Intravenous New Bag/Given 12/07/22 1734)  vancomycin (VANCOREADY) IVPB 1250 mg/250 mL (has no administration in time range)  sodium chloride 0.9 % bolus 1,000 mL (1,000 mLs Intravenous New Bag/Given 12/07/22 1734)  potassium chloride SA (KLOR-CON M) CR tablet 40 mEq (40 mEq Oral Given 12/07/22 1733)    Mobility walks     Focused Assessments Pulmonary Assessment Handoff:  Lung sounds:   O2 Device: Room Air      R Recommendations: See Admitting Provider Note  Report given to:   Additional Notes: Ambulatory.

## 2022-12-07 NOTE — ED Provider Notes (Signed)
[place] EMERGENCY DEPARTMENT AT [facility] Provider Note   CSN: [id number] Arrival date & time: 12/07/22  1217     History  Chief Complaint  Patient presents with   Pneumonia    Travis Beck is a 77 y.o. male presenting to ED with persistent left-sided chest pain, cough and fever.  The patient was discharged the hospital 4 days ago after a critical stay requiring ICU admission for respiratory failure in the setting of left lower lobe and right lower lobe pneumonia, with hypotension requiring vasopressors and a mild AKI.  The patient was weaned back to room air, treated with antibiotics in the hospital, subsequently discharged with Augmentin and doxycycline for 2 more days, which she says he completed.  His blood cultures from my review of the external records were no growth to date.  His MRSA PCR test was positive.  Patient reports that the left-sided chest pain never improved.  He continues to have a mild persistent cough.  His home nurse or visiting nurse was concerned that the patient had a fever last night or overnight.  He continues to feel weak and says that he was "just lying in bed for 4 days".  HPI     Home Medications Prior to Admission medications   Medication Sig Start Date End Date Taking? Authorizing Provider  acetaminophen (TYLENOL) 325 MG tablet Take 2 tablets (650 mg total) by mouth every 6 (six) hours as needed for mild pain (or Fever >/= 101). 11/26/19  Yes Emokpae, Courage, MD  albuterol (VENTOLIN HFA) 108 (90 Base) MCG/ACT inhaler Inhale 2 puffs into the lungs every 4 (four) hours as needed for wheezing or shortness of breath. 11/26/19  Yes Emokpae, Courage, MD  ALPRAZolam Prudy Feeler) 1 MG tablet Take 1 tablet (1 mg total) by mouth 3 (three) times daily as needed for anxiety. 01/28/19  Yes Welborn, Ryan, DO  amLODipine (NORVASC) 5 MG tablet Take 5 mg by mouth at bedtime. 11/06/22  Yes [provider]  buprenorphine (SUBUTEX) 8 MG SUBL SL  tablet Place 4-8 mg under the tongue See admin instructions. Take 8 mg by mouth in the morning and then take 4 mg by mouth at bedtime. 01/04/19  Yes [provider]  cetirizine (ZYRTEC) 10 MG tablet Take 10 mg by mouth as needed for allergies (itching).   Yes [provider]  fluticasone (FLONASE) 50 MCG/ACT nasal spray Place 2 sprays into both nostrils daily.  08/22/18  Yes [provider]  gabapentin (NEURONTIN) 400 MG capsule Take 1 capsule (400 mg total) by mouth every 8 (eight) hours as needed. Patient taking differently: Take 400 mg by mouth every 8 (eight) hours as needed (pain). 01/28/19  Yes Welborn, Ryan, DO  glipiZIDE (GLUCOTROL XL) 10 MG 24 hr tablet Take 10 mg by mouth daily. 11/06/22  Yes [provider]  metFORMIN (GLUCOPHAGE) 1000 MG tablet Take 1,000 mg by mouth 2 (two) times daily with a meal.  12/07/14  Yes [provider]  montelukast (SINGULAIR) 10 MG tablet Take 1 tablet (10 mg total) by mouth at bedtime. 06/15/22  Yes Mannam, Praveen, MD  pantoprazole (PROTONIX) 40 MG tablet Take 1 tablet (40 mg total) by mouth daily. 12/15/20  Yes Dolores Frame, MD  pramipexole (MIRAPEX) 0.5 MG tablet Take 0.5 mg by mouth at bedtime. 11/04/22  Yes [provider]  pravastatin (PRAVACHOL) 20 MG tablet Take 20 mg by mouth every evening.   Yes [provider]  valsartan (DIOVAN) 160  MG tablet Take 160 mg by mouth daily. 11/06/22  Yes [provider]  aspirin EC 81 MG tablet Take 1 tablet (81 mg total) by mouth daily. Swallow whole. Patient not taking: Reported on 11/30/2022 09/30/20   Jonelle Sidle, MD  Fluticasone-Umeclidin-Vilant (TRELEGY ELLIPTA) 100-62.5-25 MCG/ACT AEPB Inhale 1 puff into the lungs daily. Patient not taking: Reported on 12/02/2022 06/15/22   Chilton Greathouse, MD      Allergies    Penicillins    Review of Systems   Review of Systems  Physical Exam Updated Vital Signs BP (!) 151/84   Pulse 74    Temp 98.5 F (36.9 C) (Oral)   Resp 12   Ht 5\' 5"  (1.651 m)   Wt 64.2 kg   SpO2 93%   BMI 23.55 kg/m  Physical Exam Constitutional:      General: He is not in acute distress. HENT:     Head: Normocephalic and atraumatic.  Eyes:     Conjunctiva/sclera: Conjunctivae normal.     Pupils: Pupils are equal, round, and reactive to light.  Cardiovascular:     Rate and Rhythm: Normal rate and regular rhythm.  Pulmonary:     Effort: Pulmonary effort is normal. No respiratory distress.     Comments: Rhonchi in the left lower lobe, speaking in full sentences, no audible wheezing Abdominal:     General: There is no distension.     Tenderness: There is no abdominal tenderness.  Skin:    General: Skin is warm and dry.  Neurological:     General: No focal deficit present.     Mental Status: He is alert. Mental status is at baseline.  Psychiatric:        Mood and Affect: Mood normal.        Behavior: Behavior normal.     ED Results / Procedures / Treatments   Labs (all labs ordered are listed, but only abnormal results are displayed) Labs Reviewed  BASIC METABOLIC PANEL - Abnormal; Notable for the following components:      Result Value   Sodium 131 (*)    Potassium 3.3 (*)    Chloride 91 (*)    Glucose, Bld 170 (*)    Calcium 8.2 (*)    All other components within normal limits  CBC WITH DIFFERENTIAL/PLATELET - Abnormal; Notable for the following components:   WBC 12.6 (*)    RBC 3.68 (*)    Hemoglobin 10.9 (*)    HCT 33.4 (*)    Neutro Abs 10.3 (*)    Abs Immature Granulocytes 0.17 (*)    All other components within normal limits  CBG MONITORING, ED - Abnormal; Notable for the following components:   Glucose-Capillary 177 (*)    All other components within normal limits  SARS CORONAVIRUS 2 BY RT PCR  MRSA NEXT GEN BY PCR, NASAL  BRAIN NATRIURETIC PEPTIDE  PROCALCITONIN  PROCALCITONIN    EKG None  Radiology DG Chest 2 View  Result Date: 12/07/2022 CLINICAL  DATA:  Shortness of breath. EXAM: CHEST - 2 VIEW COMPARISON:  12/02/2022. FINDINGS: Improved aeration of the lung bases with persistent consolidation in the left lower lobe. Trace bilateral pleural effusions. No pneumothorax. Stable cardiac and mediastinal contours. IMPRESSION: Improved aeration of the lung bases with persistent consolidation in the left lower lobe. Trace bilateral pleural effusions. Electronically Signed   By: Orvan Falconer M.D.   On: 12/07/2022 13:42    Procedures Procedures    Medications Ordered in ED  Medications  ceFEPIme (MAXIPIME) 2 g in sodium chloride 0.9 % 100 mL IVPB (2 g Intravenous New Bag/Given 12/07/22 1734)  vancomycin (VANCOREADY) IVPB 1250 mg/250 mL (has no administration in time range)  guaiFENesin (MUCINEX) 12 hr tablet 600 mg (has no administration in time range)  ipratropium-albuterol (DUONEB) 0.5-2.5 (3) MG/3ML nebulizer solution 3 mL (has no administration in time range)  cefTRIAXone (ROCEPHIN) 1 g in sodium chloride 0.9 % 100 mL IVPB (has no administration in time range)  azithromycin (ZITHROMAX) 500 mg in sodium chloride 0.9 % 250 mL IVPB (has no administration in time range)  sodium chloride 0.9 % bolus 1,000 mL (1,000 mLs Intravenous New Bag/Given 12/07/22 1734)  potassium chloride SA (KLOR-CON M) CR tablet 40 mEq (40 mEq Oral Given 12/07/22 1733)    ED Course/ Medical Decision Making/ A&P                             Medical Decision Making Risk Prescription drug management. Decision regarding hospitalization.   This patient presents to the ED with concern for cough, fever. This involves an extensive number of treatment options, and is a complaint that carries with it a high risk of complications and morbidity.  The differential diagnosis includes persistent pneumonia versus pleural effusion versus pneumothorax versus viral URI versus other  Co-morbidities that complicate the patient evaluation: History of recent hospitalization for multifocal  pneumonia, and age, risk factors for recurring pneumonia.  External records from outside source obtained and reviewed including hospital discharge summary from May 26  I ordered and personally interpreted labs.  The pertinent results include: BNP normal.  Mild leukocytosis with blood cell count 12.6.  BMP with mild hypokalemia, creatinine within normal limits.  I ordered imaging studies including x-ray of the chest I independently visualized and interpreted imaging which showed persistent left lower lobe consolidation I agree with the radiologist interpretation  The patient was maintained on a cardiac monitor.  I personally viewed and interpreted the cardiac monitored which showed an underlying rhythm of: Normal sinus rhythm  I ordered medication including broad-spectrum antibiotic suspected hospital-acquired pneumonia, IV vancomycin given the patient is MRSA positive status, IV cefepime as well for empiric broad-spectrum coverage.   I have reviewed the patients home medicines and have made adjustments as needed  Test Considered: Lower suspicion for acute PE in this clinical setting.  No indication for CT angiogram.   After the interventions noted above, I reevaluated the patient and found that they have: stayed the same   Dispostion:  After consideration of the diagnostic results and the patients response to treatment, I feel that the patent would benefit from medical admission for persistent pneumonia, with failed outpatient treatment.  I did explain and discussed with the patient the possibility that he is in fact improving or recovering from his recent pneumonia, and that often it takes several weeks for his chest x-ray to reflect improvement of prior consolidations.  However, given the severity of his prior hospitalization stay and illness, and his WBC elevation, I think an observation stay would be reasonable on antibiotics.  At this time, I have a low suspicion for sepsis.  Prior  blood cultures were negative.  Patient's vital signs are unremarkable and he is not hypoxic at the time of admission.         Final Clinical Impression(s) / ED Diagnoses Final diagnoses:  HCAP (healthcare-associated pneumonia)  Pneumonia of left lower lobe due to infectious organism  Hypokalemia    Rx / DC Orders ED Discharge Orders     None         Jermall Isaacson, Kermit Balo, MD 12/07/22 1759

## 2022-12-08 DIAGNOSIS — E1169 Type 2 diabetes mellitus with other specified complication: Secondary | ICD-10-CM

## 2022-12-08 DIAGNOSIS — J90 Pleural effusion, not elsewhere classified: Secondary | ICD-10-CM | POA: Diagnosis not present

## 2022-12-08 DIAGNOSIS — E876 Hypokalemia: Secondary | ICD-10-CM

## 2022-12-08 DIAGNOSIS — G894 Chronic pain syndrome: Secondary | ICD-10-CM | POA: Diagnosis present

## 2022-12-08 DIAGNOSIS — Z87891 Personal history of nicotine dependence: Secondary | ICD-10-CM | POA: Diagnosis not present

## 2022-12-08 DIAGNOSIS — R918 Other nonspecific abnormal finding of lung field: Secondary | ICD-10-CM | POA: Diagnosis not present

## 2022-12-08 DIAGNOSIS — Z7401 Bed confinement status: Secondary | ICD-10-CM | POA: Diagnosis not present

## 2022-12-08 DIAGNOSIS — I10 Essential (primary) hypertension: Secondary | ICD-10-CM

## 2022-12-08 DIAGNOSIS — R5381 Other malaise: Secondary | ICD-10-CM | POA: Diagnosis present

## 2022-12-08 DIAGNOSIS — J189 Pneumonia, unspecified organism: Secondary | ICD-10-CM | POA: Diagnosis not present

## 2022-12-08 DIAGNOSIS — E222 Syndrome of inappropriate secretion of antidiuretic hormone: Secondary | ICD-10-CM | POA: Diagnosis present

## 2022-12-08 DIAGNOSIS — J15212 Pneumonia due to Methicillin resistant Staphylococcus aureus: Secondary | ICD-10-CM | POA: Diagnosis present

## 2022-12-08 DIAGNOSIS — J181 Lobar pneumonia, unspecified organism: Secondary | ICD-10-CM | POA: Diagnosis not present

## 2022-12-08 DIAGNOSIS — F419 Anxiety disorder, unspecified: Secondary | ICD-10-CM | POA: Diagnosis present

## 2022-12-08 DIAGNOSIS — J439 Emphysema, unspecified: Secondary | ICD-10-CM | POA: Diagnosis present

## 2022-12-08 DIAGNOSIS — Z7982 Long term (current) use of aspirin: Secondary | ICD-10-CM | POA: Diagnosis not present

## 2022-12-08 DIAGNOSIS — Z880 Allergy status to penicillin: Secondary | ICD-10-CM | POA: Diagnosis not present

## 2022-12-08 DIAGNOSIS — Z7984 Long term (current) use of oral hypoglycemic drugs: Secondary | ICD-10-CM | POA: Diagnosis not present

## 2022-12-08 DIAGNOSIS — E785 Hyperlipidemia, unspecified: Secondary | ICD-10-CM | POA: Diagnosis present

## 2022-12-08 DIAGNOSIS — Y95 Nosocomial condition: Secondary | ICD-10-CM | POA: Diagnosis present

## 2022-12-08 DIAGNOSIS — J440 Chronic obstructive pulmonary disease with acute lower respiratory infection: Secondary | ICD-10-CM | POA: Diagnosis present

## 2022-12-08 DIAGNOSIS — Z79899 Other long term (current) drug therapy: Secondary | ICD-10-CM | POA: Diagnosis not present

## 2022-12-08 DIAGNOSIS — R0602 Shortness of breath: Secondary | ICD-10-CM | POA: Diagnosis not present

## 2022-12-08 DIAGNOSIS — J869 Pyothorax without fistula: Secondary | ICD-10-CM | POA: Diagnosis present

## 2022-12-08 DIAGNOSIS — Z1152 Encounter for screening for COVID-19: Secondary | ICD-10-CM | POA: Diagnosis not present

## 2022-12-08 DIAGNOSIS — E119 Type 2 diabetes mellitus without complications: Secondary | ICD-10-CM | POA: Diagnosis present

## 2022-12-08 LAB — BASIC METABOLIC PANEL
Anion gap: 12 (ref 5–15)
BUN: 11 mg/dL (ref 8–23)
CO2: 26 mmol/L (ref 22–32)
Calcium: 7.6 mg/dL — ABNORMAL LOW (ref 8.9–10.3)
Chloride: 96 mmol/L — ABNORMAL LOW (ref 98–111)
Creatinine, Ser: 0.76 mg/dL (ref 0.61–1.24)
GFR, Estimated: >60 mL/min (ref >60)
Glucose, Bld: 97 mg/dL (ref 70–99)
Potassium: 2.9 mmol/L — ABNORMAL LOW (ref 3.5–5.1)
Sodium: 134 mmol/L — ABNORMAL LOW (ref 135–145)

## 2022-12-08 LAB — CBC
HCT: 29.5 % — ABNORMAL LOW (ref 39.0–52.0)
Hemoglobin: 10 g/dL — ABNORMAL LOW (ref 13.0–17.0)
MCH: 29.9 pg (ref 26.0–34.0)
MCHC: 33.9 g/dL (ref 30.0–36.0)
MCV: 88.3 fL (ref 80.0–100.0)
Platelets: 356 10*3/uL (ref 150–400)
RBC: 3.34 MIL/uL — ABNORMAL LOW (ref 4.22–5.81)
RDW: 13.3 % (ref 11.5–15.5)
WBC: 9.4 10*3/uL (ref 4.0–10.5)
nRBC: 0 % (ref 0.0–0.2)

## 2022-12-08 LAB — EXPECTORATED SPUTUM ASSESSMENT W GRAM STAIN, RFLX TO RESP C

## 2022-12-08 LAB — MRSA NEXT GEN BY PCR, NASAL: MRSA by PCR Next Gen: DETECTED — AB

## 2022-12-08 LAB — GLUCOSE, CAPILLARY
Glucose-Capillary: 224 mg/dL — ABNORMAL HIGH (ref 70–99)
Glucose-Capillary: 231 mg/dL — ABNORMAL HIGH (ref 70–99)
Glucose-Capillary: 140 mg/dL — ABNORMAL HIGH (ref 70–99)
Glucose-Capillary: 227 mg/dL — ABNORMAL HIGH (ref 70–99)

## 2022-12-08 LAB — PROCALCITONIN: Procalcitonin: 1 ng/mL

## 2022-12-08 LAB — STREP PNEUMONIAE URINARY ANTIGEN: Strep Pneumo Urinary Antigen: NEGATIVE

## 2022-12-08 MED ORDER — BUPRENORPHINE HCL 2 MG SL SUBL
8.0000 mg | SUBLINGUAL_TABLET | Freq: Every day | SUBLINGUAL | Status: DC
  Administered 2022-12-08 – 2022-12-13 (×6): 8 mg via SUBLINGUAL
  Filled 2022-12-08 (×6): qty 4

## 2022-12-08 MED ORDER — PANTOPRAZOLE SODIUM 40 MG PO TBEC
40.0000 mg | DELAYED_RELEASE_TABLET | Freq: Every day | ORAL | Status: DC
  Administered 2022-12-08 – 2022-12-13 (×6): 40 mg via ORAL
  Filled 2022-12-08 (×6): qty 1

## 2022-12-08 MED ORDER — ASPIRIN 81 MG PO TBEC
81.0000 mg | DELAYED_RELEASE_TABLET | Freq: Every day | ORAL | Status: DC
  Administered 2022-12-08 – 2022-12-13 (×6): 81 mg via ORAL
  Filled 2022-12-08 (×6): qty 1

## 2022-12-08 MED ORDER — BUPRENORPHINE HCL 2 MG SL SUBL
4.0000 mg | SUBLINGUAL_TABLET | Freq: Every day | SUBLINGUAL | Status: DC
  Administered 2022-12-08 – 2022-12-12 (×5): 4 mg via SUBLINGUAL
  Filled 2022-12-08 (×5): qty 2

## 2022-12-08 MED ORDER — GABAPENTIN 400 MG PO CAPS
400.0000 mg | ORAL_CAPSULE | Freq: Three times a day (TID) | ORAL | Status: DC | PRN
  Administered 2022-12-10 – 2022-12-12 (×3): 400 mg via ORAL
  Filled 2022-12-08 (×3): qty 1

## 2022-12-08 MED ORDER — PRAVASTATIN SODIUM 10 MG PO TABS
20.0000 mg | ORAL_TABLET | Freq: Every evening | ORAL | Status: DC
  Administered 2022-12-08 – 2022-12-12 (×5): 20 mg via ORAL
  Filled 2022-12-08 (×5): qty 2

## 2022-12-08 MED ORDER — ARFORMOTEROL TARTRATE 15 MCG/2ML IN NEBU
15.0000 ug | INHALATION_SOLUTION | Freq: Two times a day (BID) | RESPIRATORY_TRACT | Status: DC
  Administered 2022-12-08 – 2022-12-13 (×10): 15 ug via RESPIRATORY_TRACT
  Filled 2022-12-08 (×10): qty 2

## 2022-12-08 MED ORDER — PRAMIPEXOLE DIHYDROCHLORIDE 0.25 MG PO TABS
0.5000 mg | ORAL_TABLET | Freq: Every day | ORAL | Status: DC
  Administered 2022-12-08 – 2022-12-12 (×5): 0.5 mg via ORAL
  Filled 2022-12-08 (×6): qty 2

## 2022-12-08 MED ORDER — AMLODIPINE BESYLATE 5 MG PO TABS
5.0000 mg | ORAL_TABLET | Freq: Every day | ORAL | Status: DC
  Administered 2022-12-08 – 2022-12-11 (×4): 5 mg via ORAL
  Filled 2022-12-08 (×4): qty 1

## 2022-12-08 MED ORDER — POTASSIUM CHLORIDE CRYS ER 20 MEQ PO TBCR
40.0000 meq | EXTENDED_RELEASE_TABLET | ORAL | Status: AC
  Administered 2022-12-08 (×2): 40 meq via ORAL
  Filled 2022-12-08 (×2): qty 2

## 2022-12-08 MED ORDER — BUDESONIDE 0.25 MG/2ML IN SUSP
0.2500 mg | Freq: Two times a day (BID) | RESPIRATORY_TRACT | Status: DC
  Administered 2022-12-08 – 2022-12-13 (×10): 0.25 mg via RESPIRATORY_TRACT
  Filled 2022-12-08 (×10): qty 2

## 2022-12-08 MED ORDER — LINEZOLID 600 MG/300ML IV SOLN
600.0000 mg | Freq: Two times a day (BID) | INTRAVENOUS | Status: DC
  Administered 2022-12-08 – 2022-12-10 (×6): 600 mg via INTRAVENOUS
  Filled 2022-12-08 (×7): qty 300

## 2022-12-08 MED ORDER — REVEFENACIN 175 MCG/3ML IN SOLN
175.0000 ug | Freq: Every day | RESPIRATORY_TRACT | Status: DC
  Administered 2022-12-09 – 2022-12-13 (×5): 175 ug via RESPIRATORY_TRACT
  Filled 2022-12-08 (×5): qty 3

## 2022-12-08 MED ORDER — ALPRAZOLAM 0.5 MG PO TABS
0.5000 mg | ORAL_TABLET | Freq: Three times a day (TID) | ORAL | Status: DC | PRN
  Administered 2022-12-08 – 2022-12-11 (×3): 0.5 mg via ORAL
  Filled 2022-12-08 (×3): qty 1

## 2022-12-08 NOTE — Progress Notes (Signed)
Pt was recently dc from here for PNA. His recent sputum culture came back MRSA. Change current abx to linezolid per Dr Jerral Ralph.  Ulyses Southward, PharmD, BCIDP, AAHIVP, CPP Infectious Disease Pharmacist 12/08/2022 8:12 AM

## 2022-12-08 NOTE — Evaluation (Signed)
Clinical/Bedside Swallow Evaluation Patient Details  Name: Joevon Catala MRN: [id number] Date of Birth: 11-14-1945  Today's Date: 12/08/2022 Time: SLP Start Time (ACUTE ONLY): 4098 SLP Stop Time (ACUTE ONLY): 0950 SLP Time Calculation (min) (ACUTE ONLY): 25 min  Past Medical History:  Past Medical History:  Diagnosis Date   Arthritis    Asthma    Childhood   Emphysema lung (HCC)    Essential hypertension    Ischemic heart disease    Myoview 2020 indicating inferior infarct scar with mild peri-infarct ischemia - managed medically   Type 2 diabetes mellitus (HCC)    Past Surgical History:  Past Surgical History:  Procedure Laterality Date   BIOPSY  08/30/2018   Procedure: BIOPSY;  Surgeon: Malissa Hippo, MD;  [facility];  Service: Endoscopy;;  gastric    BIOPSY  11/05/2020   Procedure: BIOPSY;  Surgeon: Dolores Frame, MD;  [facility];  Service: Gastroenterology;;   COLONOSCOPY     COLONOSCOPY WITH PROPOFOL N/A 11/05/2020   Procedure: COLONOSCOPY WITH PROPOFOL;  Surgeon: Dolores Frame, MD;  [facility];  Service: Gastroenterology;  Laterality: N/A;  Am   ESOPHAGEAL DILATION N/A 05/21/2019   Procedure: ESOPHAGEAL DILATION;  Surgeon: Malissa Hippo, MD;  [facility];  Service: Endoscopy;  Laterality: N/A;   ESOPHAGOGASTRODUODENOSCOPY N/A 05/21/2019   Procedure: ESOPHAGOGASTRODUODENOSCOPY (EGD);  Surgeon: Malissa Hippo, MD;  [facility];  Service: Endoscopy;  Laterality: N/A;  730   ESOPHAGOGASTRODUODENOSCOPY (EGD) WITH PROPOFOL N/A 08/30/2018   Procedure: ESOPHAGOGASTRODUODENOSCOPY (EGD) WITH PROPOFOL;  Surgeon: Malissa Hippo, MD;  [facility];  Service: Endoscopy;  Laterality: N/A;  1:30   ESOPHAGOGASTRODUODENOSCOPY (EGD) WITH PROPOFOL N/A 11/05/2020   Procedure: ESOPHAGOGASTRODUODENOSCOPY (EGD) WITH PROPOFOL;  Surgeon: Dolores Frame, MD;  [facility];  Service: Gastroenterology;  Laterality: N/A;   HERNIA REPAIR     bilateral lower abdomen   POLYPECTOMY  11/05/2020   Procedure: POLYPECTOMY;  Surgeon: Dolores Frame, MD;  [facility];  Service: Gastroenterology;;   Gaspar Bidding DILATION  11/05/2020   Procedure: Gaspar Bidding DILATION;  Surgeon: Marguerita Merles, Reuel Boom, MD;  [facility];  Service: Gastroenterology;;   HPI:  77 yo male adm to [facility] with pna - was recently dc'd and was sent back.  He reports he has been fighting pna for months.  PMH + for remote smoker - stopped 50 years ago per pt, GERD, concern for polypharmacy.    Pt sputum tested positive for MRSA.   He has undergone endoscopy in the past and was dilated at the [facility] - s/p biopsies due to concern for possible Barrett's.  Pt reported some difficulties swallowing prior to EGD but states he is stable currently.  Note per prior notes, concern for risk of polypharmacy listed.    Assessment / Plan / Recommendation  Clinical Impression  Patient presents with functional oropharyngeal swallow ability based on clinical swallow evaluation.  He passed Yale 3 ounce water test easily and no indication of dysphagia with 4 ounces of applesauce and 2 graham crackers.  Swallow and respiration were coordinated well with exhalation post-swallow.  Pt does have h/o GERD and required dilatation, thus advise general GERD precautions and follow up with GI as OP as needed.  Pt educated and agreeable. Thanks for this consult.      Aspiration Risk  Mild aspiration risk    Diet Recommendation Regular;Thin liquid  Liquid Administration via: Cup;Straw Medication Administration: Whole meds with liquid Supervision: Patient able to self feed Compensations: Slow rate;Small sips/bites Postural Changes: Seated upright at 90 degrees;Remain upright for at least 30 minutes after po intake    Other  Recommendations Oral Care Recommendations: Oral care BID    Recommendations for  follow up therapy are one component of a multi-disciplinary discharge planning process, led by the attending physician.  Recommendations may be updated based on patient status, additional functional criteria and insurance authorization.  Follow up Recommendations No SLP follow up      Assistance Recommended at Discharge  N/a  Functional Status Assessment Patient has not had a recent decline in their functional status  Frequency and Duration   N/a         Prognosis     N/a   Swallow Study   General Date of Onset: 12/08/22 HPI: 77 yo male adm to [facility] with pna - was recently dc'd and was sent back.  He reports he has been fighting pna for months.  PMH + for remote smoker - stopped 50 years ago per pt, GERD, concern for polypharmacy.    Pt sputum tested positive for MRSA.   He has undergone endoscopy in the past and was dilated at the [facility] - s/p biopsies due to concern for possible Barrett's.  Pt reported some difficulties swallowing prior to EGD but states he is stable currently.  Note per prior notes, concern for risk of polypharmacy listed. Type of Study: Bedside Swallow Evaluation Diet Prior to this Study: Regular;Thin liquids (Level 0) Temperature Spikes Noted: No Respiratory Status: Nasal cannula History of Recent Intubation: No Behavior/Cognition: Alert;Cooperative;Pleasant mood Oral Cavity Assessment: Within Functional Limits Oral Care Completed by SLP: No Oral Cavity - Dentition: Other (Comment);Poor condition Vision: Functional for self-feeding Self-Feeding Abilities: Able to feed self Patient Positioning: Upright in bed Baseline Vocal Quality: Normal Volitional Cough: Strong Volitional Swallow: Able to elicit    Oral/Motor/Sensory Function Overall Oral Motor/Sensory Function: Within functional limits   Ice Chips Ice chips: Not tested   Thin Liquid Thin Liquid: Within functional limits Presentation: Straw    Nectar Thick Nectar Thick Liquid: Not tested   Honey Thick Honey  Thick Liquid: Not tested   Puree Puree: Within functional limits Presentation: Self Fed;Spoon   Solid     Solid: Within functional limits Presentation: Self Fed      Chales Abrahams 12/08/2022,9:59 AM  Rolena Infante, MS [facility] SLP Acute Rehab Services Office [phone]

## 2022-12-08 NOTE — Evaluation (Signed)
Physical Therapy Evaluation Patient Details Name: Travis Beck MRN: [id number] DOB: February 08, 1946 Today's Date: 12/08/2022  History of Present Illness  Pt is 77 year old presented to [facility] on  12/07/22 for dyspnea and fever. Pt recently hospitalized for respiratory failure and septic shock secondary to PNA. Pt with incomplete resolution of MRSA PNA.  PMH - copd, dm, htn, chronic back pain, anxiety  Clinical Impression  Pt presents to PT with slightly unsteady gait due to illness and inactivity. Expect pt will make good progress back to baseline with mobility. Will follow acutely but doubt pt will need PT after DC.         Recommendations for follow up therapy are one component of a multi-disciplinary discharge planning process, led by the attending physician.  Recommendations may be updated based on patient status, additional functional criteria and insurance authorization.  Follow Up Recommendations       Assistance Recommended at Discharge PRN  Patient can return home with the following  Assist for transportation;Assistance with cooking/housework    Equipment Recommendations None recommended by PT  Recommendations for Other Services       Functional Status Assessment Patient has had a recent decline in their functional status and demonstrates the ability to make significant improvements in function in a reasonable and predictable amount of time.     Precautions / Restrictions Restrictions Weight Bearing Restrictions: No      Mobility  Bed Mobility Overal bed mobility: Modified Independent                  Transfers Overall transfer level: Needs assistance Equipment used: None Transfers: Sit to/from Stand Sit to Stand: Supervision           General transfer comment: supervision for safety    Ambulation/Gait Ambulation/Gait assistance: Min guard Gait Distance (Feet): 300 Feet Assistive device: None Gait Pattern/deviations: Step-through pattern,  Decreased stride length, Drifts right/left Gait velocity: decr Gait velocity interpretation: 1.31 - 2.62 ft/sec, indicative of limited community ambulator   General Gait Details: Pt with unsteady gait without overt loss of balance but less confident and more guarded than baseline.  Stairs            Wheelchair Mobility    Modified Rankin (Stroke Patients Only)       Balance Overall balance assessment: Needs assistance Sitting-balance support: No upper extremity supported, Feet supported Sitting balance-Leahy Scale: Normal     Standing balance support: No upper extremity supported, During functional activity Standing balance-Leahy Scale: Good                               Pertinent Vitals/Pain Pain Assessment Pain Assessment: Faces Faces Pain Scale: Hurts little more Pain Location: Lt pleural pain Pain Descriptors / Indicators: Grimacing, Guarding Pain Intervention(s): Limited activity within patient's tolerance    Home Living Family/patient expects to be discharged to:: Private residence Living Arrangements: Other relatives (brother) Available Help at Discharge: Family;Available PRN/intermittently Type of Home: House Home Access: Ramped entrance       Home Layout: One level Home Equipment: Wheelchair - Forensic psychologist (2 wheels);Shower seat      Prior Function               Mobility Comments: ADLs independently ADLs Comments: assist for cleaning and washing, tend to eat out or quick prep meals     Hand Dominance   Dominant Hand: Right    Extremity/Trunk Assessment  Upper Extremity Assessment Upper Extremity Assessment: Overall WFL for tasks assessed    Lower Extremity Assessment Lower Extremity Assessment: Generalized weakness       Communication   Communication: No difficulties  Cognition Arousal/Alertness: Awake/alert Behavior During Therapy: WFL for tasks assessed/performed Overall Cognitive Status: Within  Functional Limits for tasks assessed                                          General Comments General comments (skin integrity, edema, etc.): Pt amb on RA with SpO2 89%    Exercises     Assessment/Plan    PT Assessment Patient needs continued PT services  PT Problem List Decreased strength;Decreased balance;Decreased mobility       PT Treatment Interventions DME instruction;Gait training;Stair training;Functional mobility training;Therapeutic activities;Therapeutic exercise;Balance training;Patient/family education    PT Goals (Current goals can be found in the Care Plan section)  Acute Rehab PT Goals Patient Stated Goal: get his strength back PT Goal Formulation: With patient Time For Goal Achievement: 12/15/22 Potential to Achieve Goals: Good    Frequency Min 3X/week     Co-evaluation               AM-PAC PT "6 Clicks" Mobility  Outcome Measure Help needed turning from your back to your side while in a flat bed without using bedrails?: None Help needed moving from lying on your back to sitting on the side of a flat bed without using bedrails?: None Help needed moving to and from a bed to a chair (including a wheelchair)?: A Little Help needed standing up from a chair using your arms (e.g., wheelchair or bedside chair)?: A Little Help needed to walk in hospital room?: A Little Help needed climbing 3-5 steps with a railing? : A Little 6 Click Score: 20    End of Session   Activity Tolerance: Patient tolerated treatment well Patient left: in bed;with call bell/phone within reach;with bed alarm set   PT Visit Diagnosis: Unsteadiness on feet (R26.81);Muscle weakness (generalized) (M62.81)    Time: 9811-9147 PT Time Calculation (min) (ACUTE ONLY): 15 min   Charges:   PT Evaluation $PT Eval Low Complexity: 1 Low          [facility] PT Acute Rehabilitation Services Office [phone]   Angelina Ok [facility] 12/08/2022, 1:51 PM

## 2022-12-08 NOTE — Progress Notes (Signed)
PROGRESS NOTE        PATIENT DETAILS Name: Travis Beck Age: 77 y.o. Sex: male Date of Birth: Feb 07, 1946 Admit Date: 12/07/2022 Admitting Physician Lorin Glass, MD ZOX:WRUE, Beatrix Fetters, MD  Brief Summary: Patient is a 77 y.o.  male with history of COPD, DM-2, HTN-who was recently hospitalized from 5/23 to 5/26 for septic shock secondary to multilobar pneumonia-requiring vasopressor on admission-stabilized and subsequently discharged home on Augmentin/doxycycline-presented back to the hospital with fever of 103 F and ongoing left-sided pleuritic chest pain.  It appears that his sputum cultures resulted post discharge-and were positive for MRSA.  Significant events: 5/23-5/26>> hospitalization for septic shock from multilobar PNA. 5/30>> fever 103 F at home-ongoing left-sided pleuritic chest pain-admit to [facility].  Significant studies: 5/23>> CTA chest: No PE, consolidation of most left lower lobe. 5/23>> CT renal stone study: Bilateral lower lobe consolidation left> right, no renal stone/hydronephrosis. 5/30>> CXR: Persistent consolidation left lower lobe-improved aeration of the lung bases.  Significant microbiology data: 5/24>> sputum culture: MRSA 5/30>> COVID PCR: Negative  Procedures: None  Consults: None  Subjective: Afebrile overnight.  Continues to have mild left-sided chest pain but appears comfortable.  Objective: Vitals: Blood pressure 116/76, pulse 72, temperature 98.8 F (37.1 C), temperature source Oral, resp. rate 15, height 5\' 5"  (1.651 m), weight 64.2 kg, SpO2 92 %.   Exam: Gen Exam:Alert awake-not in any distress HEENT:atraumatic, normocephalic Chest: B/L clear to auscultation anteriorly CVS:S1S2 regular Abdomen:soft non tender, non distended Extremities:no edema Neurology: Non focal Skin: no rash  Pertinent Labs/Radiology:    Latest Ref Rng & Units 12/08/2022    4:49 AM 12/07/2022    1:15 PM 12/03/2022    2:58 AM   CBC  WBC 4.0 - 10.5 K/uL 9.4  12.6  10.1   Hemoglobin 13.0 - 17.0 g/dL 45.4  09.8  11.9   Hematocrit 39.0 - 52.0 % 29.5  33.4  32.3   Platelets 150 - 400 K/uL 356  338  235     Lab Results  Component Value Date   NA 134 (L) 12/08/2022   K 2.9 (L) 12/08/2022   CL 96 (L) 12/08/2022   CO2 26 12/08/2022      Assessment/Plan: MRSA PNA (sputum culture with MRSA on 5/24) Suspect ongoing symptoms due to incompletely treated PNA-as sputum cultures finalized postdischarge.  Although patient was on doxycycline-do not think he got enough days of MRSA coverage. Stop Rocephin/Zithromax-switch to Zyvox Follow closely-if he continues to have ongoing symptoms in spite of being on adequate MRSA coverage-will need reimaging.  SLP eval pending to make sure no silent aspiration.  Hypokalemia Replete/recheck.  COPD Not in exacerbation Bronchodilators  HTN Stable Restart Norvasc Resume ARB when able-depending on BP trend  HLD Statin  DM-2 (A1c 6.8 on 5/24) CBG stable with SSI Resume oral hypoglycemics on discharge.  Recent Labs    12/07/22 1420 12/07/22 2202  GLUCAP 177* 189*     Anxiety disorder Xanax as needed-and dose reduced to half of home dosing-given risk for polypharmacy (also on Neurontin/Subutex)  Chronic pain syndrome Mostly back pain Mild pleuritic pain on the left side where he continues to have persistent infiltrates Continue Neurontin/Subutex  Debility/deconditioning PT/OT eval  BMI: Estimated body mass index is 23.55 kg/m as calculated from the following:   Height as of this encounter: 5\' 5"  (1.651 m).   Weight  as of this encounter: 64.2 kg.   Code status:   Code Status: Full Code   DVT Prophylaxis: enoxaparin (LOVENOX) injection 40 mg Start: 12/07/22 2200   Family Communication: None at bedside   Disposition Plan: Status is: Observation The patient will require care spanning > 2 midnights and should be moved to inpatient because: Severity of  illness   Planned Discharge Destination:Home health   Diet: Diet Order             Diet Carb Modified Fluid consistency: Thin; Room service appropriate? Yes  Diet effective now                     Antimicrobial agents: Anti-infectives (From admission, onward)    Start     Dose/Rate Route Frequency Ordered Stop   12/08/22 0900  linezolid (ZYVOX) IVPB 600 mg        600 mg 300 mL/hr over 60 Minutes Intravenous Every 12 hours 12/08/22 0810 12/15/22 0959   12/08/22 0000  cefTRIAXone (ROCEPHIN) 1 g in sodium chloride 0.9 % 100 mL IVPB  Status:  Discontinued        1 g 200 mL/hr over 30 Minutes Intravenous Every 24 hours 12/07/22 1751 12/08/22 0809   12/08/22 0000  azithromycin (ZITHROMAX) 500 mg in sodium chloride 0.9 % 250 mL IVPB  Status:  Discontinued        500 mg 250 mL/hr over 60 Minutes Intravenous Every 24 hours 12/07/22 1751 12/08/22 0809   12/07/22 1730  ceFEPIme (MAXIPIME) 2 g in sodium chloride 0.9 % 100 mL IVPB        2 g 200 mL/hr over 30 Minutes Intravenous  Once 12/07/22 1716 12/07/22 1915   12/07/22 1730  vancomycin (VANCOREADY) IVPB 1250 mg/250 mL        1,250 mg 166.7 mL/hr over 90 Minutes Intravenous  Once 12/07/22 1716 12/07/22 2016        MEDICATIONS: Scheduled Meds:  albuterol  2.5 mg Nebulization BID   enoxaparin (LOVENOX) injection  40 mg Subcutaneous Q24H   guaiFENesin  600 mg Oral BID   insulin aspart  0-5 Units Subcutaneous QHS   insulin aspart  0-9 Units Subcutaneous TID WC   polyethylene glycol  17 g Oral Daily   potassium chloride  40 mEq Oral Q4H   senna-docusate  1 tablet Oral QHS   Continuous Infusions:  linezolid (ZYVOX) IV     PRN Meds:.acetaminophen **OR** acetaminophen, hydrALAZINE, ipratropium-albuterol, traZODone   I have personally reviewed following labs and imaging studies  LABORATORY DATA: CBC: Recent Labs  Lab 12/02/22 0034 12/03/22 0258 12/07/22 1315 12/08/22 0449  WBC 7.3 10.1 12.6* 9.4  NEUTROABS  --   --   10.3*  --   HGB 11.4* 10.9* 10.9* 10.0*  HCT 34.1* 32.3* 33.4* 29.5*  MCV 89.0 87.3 90.8 88.3  PLT 202 235 338 356    Basic Metabolic Panel: Recent Labs  Lab 12/02/22 0034 12/03/22 0258 12/07/22 1315 12/08/22 0449  NA 133* 132* 131* 134*  K 3.7 4.4 3.3* 2.9*  CL 100 99 91* 96*  CO2 21* 25 27 26   GLUCOSE 183* 248* 170* 97  BUN 16 18 14 11   CREATININE 0.85 0.89 0.82 0.76  CALCIUM 8.2* 8.3* 8.2* 7.6*  MG 1.5* 1.9  --   --     GFR: Estimated Creatinine Clearance: 66.2 mL/min (by C-G formula based on SCr of 0.76 mg/dL).  Liver Function Tests: Recent Labs  Lab 12/02/22 0034 12/03/22  0258  AST 16 11*  ALT 13 13  ALKPHOS 37* 44  BILITOT 0.8 0.8  PROT 5.3* 5.5*  ALBUMIN 2.5* 2.3*   No results for input(s): "LIPASE", "AMYLASE" in the last 168 hours. No results for input(s): "AMMONIA" in the last 168 hours.  Coagulation Profile: No results for input(s): "INR", "PROTIME" in the last 168 hours.  Cardiac Enzymes: No results for input(s): "CKTOTAL", "CKMB", "CKMBINDEX", "TROPONINI" in the last 168 hours.  BNP (last 3 results) No results for input(s): "PROBNP" in the last 8760 hours.  Lipid Profile: No results for input(s): "CHOL", "HDL", "LDLCALC", "TRIG", "CHOLHDL", "LDLDIRECT" in the last 72 hours.  Thyroid Function Tests: No results for input(s): "TSH", "T4TOTAL", "FREET4", "T3FREE", "THYROIDAB" in the last 72 hours.  Anemia Panel: No results for input(s): "VITAMINB12", "FOLATE", "FERRITIN", "TIBC", "IRON", "RETICCTPCT" in the last 72 hours.  Urine analysis:    Component Value Date/Time   COLORURINE YELLOW 11/30/2022 1426   APPEARANCEUR CLEAR 11/30/2022 1426   LABSPEC 1.012 11/30/2022 1426   PHURINE 7.0 11/30/2022 1426   GLUCOSEU NEGATIVE 11/30/2022 1426   GLUCOSEU >=1000 (A) 04/12/2022 1020   HGBUR NEGATIVE 11/30/2022 1426   BILIRUBINUR NEGATIVE 11/30/2022 1426   KETONESUR NEGATIVE 11/30/2022 1426   PROTEINUR NEGATIVE 11/30/2022 1426   UROBILINOGEN 0.2  04/12/2022 1020   NITRITE NEGATIVE 11/30/2022 1426   LEUKOCYTESUR NEGATIVE 11/30/2022 1426    Sepsis Labs: Lactic Acid, Venous    Component Value Date/Time   LATICACIDVEN 1.5 12/02/2022 0047    MICROBIOLOGY: Recent Results (from the past 240 hour(s))  Blood culture (routine x 2)     Status: None   Collection Time: 11/30/22  2:26 PM   Specimen: BLOOD  Result Value Ref Range Status   Specimen Description BLOOD RIGHT ANTECUBITAL  Final   Special Requests   Final    BOTTLES DRAWN AEROBIC AND ANAEROBIC Blood Culture adequate volume   Culture   Final    NO GROWTH 5 DAYS Performed at [facility], [address] [place], [place] [address]    Report Status 12/05/2022 FINAL  Final  Blood culture (routine x 2)     Status: None   Collection Time: 11/30/22  4:03 PM   Specimen: BLOOD  Result Value Ref Range Status   Specimen Description BLOOD BLOOD LEFT FOREARM  Final   Special Requests   Final    BOTTLES DRAWN AEROBIC AND ANAEROBIC Blood Culture adequate volume   Culture   Final    NO GROWTH 5 DAYS Performed at [facility], [address], [place], [place] [address]    Report Status 12/05/2022 FINAL  Final  SARS Coronavirus 2 by RT PCR (hospital order, performed in [facility] hospital lab) *cepheid single result test* Anterior Nasal Swab     Status: None   Collection Time: 11/30/22  5:13 PM   Specimen: Anterior Nasal Swab  Result Value Ref Range Status   SARS Coronavirus 2 by RT PCR NEGATIVE NEGATIVE Final    Comment: (NOTE) SARS-CoV-2 target nucleic acids are NOT DETECTED.  The SARS-CoV-2 RNA is generally detectable in upper and lower respiratory specimens during the acute phase of infection. The lowest concentration of SARS-CoV-2 viral copies this assay can detect is 250 copies / mL. A negative result does not preclude SARS-CoV-2 infection and should not be used as the sole basis for treatment or other patient management decisions.  A negative result may occur with improper  specimen collection / handling, submission of specimen other than nasopharyngeal swab,  presence of viral mutation(s) within the areas targeted by this assay, and inadequate number of viral copies (<250 copies / mL). A negative result must be combined with clinical observations, patient history, and epidemiological information.  Fact Sheet for Patients:   RoadLapTop.co.za  Fact Sheet for Healthcare Providers: http://kim-miller.com/  This test is not yet approved or  cleared by the [place] FDA and has been authorized for detection and/or diagnosis of SARS-CoV-2 by FDA under an Emergency Use Authorization (EUA).  This EUA will remain in effect (meaning this test can be used) for the duration of the COVID-19 declaration under Section 564(b)(1) of the Act, 21 U.S.C. section 360bbb-3(b)(1), unless the authorization is terminated or revoked sooner.  Performed at [facility], [address], [place], [place] [address]   Respiratory (~20 pathogens) panel by PCR     Status: None   Collection Time: 11/30/22  5:13 PM   Specimen: Nasopharyngeal Swab; Respiratory  Result Value Ref Range Status   Adenovirus NOT DETECTED NOT DETECTED Final   Coronavirus 229E NOT DETECTED NOT DETECTED Final    Comment: (NOTE) The Coronavirus on the Respiratory Panel, DOES NOT test for the novel  Coronavirus (2019 nCoV)    Coronavirus HKU1 NOT DETECTED NOT DETECTED Final   Coronavirus NL63 NOT DETECTED NOT DETECTED Final   Coronavirus OC43 NOT DETECTED NOT DETECTED Final   Metapneumovirus NOT DETECTED NOT DETECTED Final   Rhinovirus / Enterovirus NOT DETECTED NOT DETECTED Final   Influenza A NOT DETECTED NOT DETECTED Final   Influenza B NOT DETECTED NOT DETECTED Final   Parainfluenza Virus 1 NOT DETECTED NOT DETECTED Final   Parainfluenza Virus 2 NOT DETECTED NOT DETECTED Final   Parainfluenza Virus 3 NOT DETECTED NOT DETECTED Final   Parainfluenza Virus 4 NOT  DETECTED NOT DETECTED Final   Respiratory Syncytial Virus NOT DETECTED NOT DETECTED Final   Bordetella pertussis NOT DETECTED NOT DETECTED Final   Bordetella Parapertussis NOT DETECTED NOT DETECTED Final   Chlamydophila pneumoniae NOT DETECTED NOT DETECTED Final   Mycoplasma pneumoniae NOT DETECTED NOT DETECTED Final    Comment: Performed at [facility], [address] [place], [place] [address]  MRSA Next Gen by PCR, Nasal     Status: Abnormal   Collection Time: 11/30/22  8:05 PM   Specimen: Nasal Mucosa; Nasal Swab  Result Value Ref Range Status   MRSA by PCR Next Gen DETECTED (A) NOT DETECTED Final    Comment: RESULT CALLED TO, READ BACK BY AND VERIFIED WITH: S. BHATTARAI AT 0122 ON 05.24.24 BY ADGER J         The GeneXpert MRSA Assay (FDA approved for NASAL specimens only), is one component of a comprehensive MRSA colonization surveillance program. It is not intended to diagnose MRSA infection nor to guide or monitor treatment for MRSA infections. Performed at [facility], [address], [place], [place] [address]   Culture, Respiratory w Gram Stain     Status: None   Collection Time: 12/01/22  1:17 PM   Specimen: Expectorated Sputum; Respiratory  Result Value Ref Range Status   Specimen Description EXPECTORATED SPUTUM  Final   Special Requests NONE  Final   Gram Stain   Final    NO WBC SEEN FEW GRAM POSITIVE COCCI RARE BUDDING YEAST SEEN Performed at [facility], [address], [place], [place] [address]    Culture   Final    MODERATE METHICILLIN RESISTANT STAPHYLOCOCCUS AUREUS   Report Status 12/04/2022 FINAL  Final   Organism ID, Bacteria METHICILLIN RESISTANT STAPHYLOCOCCUS AUREUS  Final      Susceptibility   Methicillin resistant staphylococcus aureus - MIC*    CIPROFLOXACIN <=0.5 SENSITIVE Sensitive     ERYTHROMYCIN >=8 RESISTANT Resistant     GENTAMICIN <=0.5 SENSITIVE Sensitive     OXACILLIN >=4 RESISTANT Resistant     TETRACYCLINE <=1  SENSITIVE Sensitive     VANCOMYCIN <=0.5 SENSITIVE Sensitive     TRIMETH/SULFA <=10 SENSITIVE Sensitive     CLINDAMYCIN <=0.25 SENSITIVE Sensitive     RIFAMPIN <=0.5 SENSITIVE Sensitive     Inducible Clindamycin NEGATIVE Sensitive     LINEZOLID 2 SENSITIVE Sensitive     * MODERATE METHICILLIN RESISTANT STAPHYLOCOCCUS AUREUS  Expectorated Sputum Assessment w Gram Stain, Rflx to Resp Cult     Status: None   Collection Time: 12/01/22  1:17 PM   Specimen: Sputum  Result Value Ref Range Status   Specimen Description SPU  Final   Special Requests NONE  Final   Sputum evaluation   Final    THIS SPECIMEN IS ACCEPTABLE FOR SPUTUM CULTURE Performed at [facility], [address], [place], [place] [address]    Report Status 12/01/2022 FINAL  Final  SARS Coronavirus 2 by RT PCR (hospital order, performed in [facility] hospital lab) *cepheid single result test* Anterior Nasal Swab     Status: None   Collection Time: 12/07/22  5:11 PM   Specimen: Anterior Nasal Swab  Result Value Ref Range Status   SARS Coronavirus 2 by RT PCR NEGATIVE NEGATIVE Final    Comment: Performed at [facility], [address], [place], [place] [address]  MRSA Next Gen by PCR, Nasal     Status: Abnormal   Collection Time: 12/07/22 10:00 PM   Specimen: Nasal Mucosa; Nasal Swab  Result Value Ref Range Status   MRSA by PCR Next Gen DETECTED (A) NOT DETECTED Final    Comment: RESULTS CALLED TO. READ BACK BY AND VERIFIED WITH RN R.GININWA ON 12/08/22 AT 0153 BY NM  (NOTE) The GeneXpert MRSA Assay (FDA approved for NASAL specimens only), is one component of a comprehensive MRSA colonization surveillance program. It is not intended to diagnose MRSA infection nor to guide or monitor treatment for MRSA infections. Test performance is not FDA approved in patients less than 67 years old. Performed at [facility], [address] [place], [place] [address]     RADIOLOGY STUDIES/RESULTS: DG Chest 2  View  Result Date: 12/07/2022 CLINICAL DATA:  Shortness of breath. EXAM: CHEST - 2 VIEW COMPARISON:  12/02/2022. FINDINGS: Improved aeration of the lung bases with persistent consolidation in the left lower lobe. Trace bilateral pleural effusions. No pneumothorax. Stable cardiac and mediastinal contours. IMPRESSION: Improved aeration of the lung bases with persistent consolidation in the left lower lobe. Trace bilateral pleural effusions. Electronically Signed   By: Orvan Falconer M.D.   On: 12/07/2022 13:42     LOS: 0 days   Jeoffrey Massed, MD  [facility]    To contact the attending provider between 7A-7P or the covering provider during after hours 7P-7A, please log into the web site [email or web address] and access using universal [place] password for that web site. If you do not have the password, please call the hospital operator.  12/08/2022, 8:20 AM

## 2022-12-08 NOTE — TOC CM/SW Note (Signed)
Transition of Care [facility]) - Inpatient Brief Assessment   Patient Details  Name: Travis Beck MRN: [id number] Date of Birth: [birth year]  [facility] CM/SW Contact:    Gordy Clement, RN Phone Number: 12/08/2022, 4:41 PM   Clinical Narrative:   Patient recently dc'd to home with Brother who is blind- Patient is Brother's caregiver . Apparently had PCS services also.   TOC will continue to follow patient for any additional discharge needs       Transition of Care Asessment: Insurance and Status: Insurance coverage has been reviewed Patient has primary care physician: Yes Home environment has been reviewed: From Home with Brother Prior level of function:: Dependent Prior/Current Home Services: Current home services Social Determinants of Health Reivew: SDOH reviewed no interventions necessary Readmission risk has been reviewed: Yes (19%) Transition of care needs: transition of care needs identified, TOC will continue to follow

## 2022-12-09 DIAGNOSIS — I10 Essential (primary) hypertension: Secondary | ICD-10-CM | POA: Diagnosis not present

## 2022-12-09 DIAGNOSIS — E876 Hypokalemia: Secondary | ICD-10-CM | POA: Diagnosis not present

## 2022-12-09 DIAGNOSIS — J189 Pneumonia, unspecified organism: Secondary | ICD-10-CM | POA: Diagnosis not present

## 2022-12-09 DIAGNOSIS — E1169 Type 2 diabetes mellitus with other specified complication: Secondary | ICD-10-CM | POA: Diagnosis not present

## 2022-12-09 LAB — GLUCOSE, CAPILLARY
Glucose-Capillary: 193 mg/dL — ABNORMAL HIGH (ref 70–99)
Glucose-Capillary: 215 mg/dL — ABNORMAL HIGH (ref 70–99)
Glucose-Capillary: 210 mg/dL — ABNORMAL HIGH (ref 70–99)
Glucose-Capillary: 172 mg/dL — ABNORMAL HIGH (ref 70–99)

## 2022-12-09 LAB — BASIC METABOLIC PANEL
Anion gap: 10 (ref 5–15)
BUN: 7 mg/dL — ABNORMAL LOW (ref 8–23)
CO2: 26 mmol/L (ref 22–32)
Calcium: 7.7 mg/dL — ABNORMAL LOW (ref 8.9–10.3)
Chloride: 94 mmol/L — ABNORMAL LOW (ref 98–111)
Creatinine, Ser: 0.78 mg/dL (ref 0.61–1.24)
GFR, Estimated: >60 mL/min (ref >60)
Glucose, Bld: 198 mg/dL — ABNORMAL HIGH (ref 70–99)
Potassium: 3.3 mmol/L — ABNORMAL LOW (ref 3.5–5.1)
Sodium: 130 mmol/L — ABNORMAL LOW (ref 135–145)

## 2022-12-09 LAB — CBC
HCT: 29 % — ABNORMAL LOW (ref 39.0–52.0)
Hemoglobin: 9.8 g/dL — ABNORMAL LOW (ref 13.0–17.0)
MCH: 30.3 pg (ref 26.0–34.0)
MCHC: 33.8 g/dL (ref 30.0–36.0)
MCV: 89.8 fL (ref 80.0–100.0)
Platelets: 333 10*3/uL (ref 150–400)
RBC: 3.23 MIL/uL — ABNORMAL LOW (ref 4.22–5.81)
RDW: 13.2 % (ref 11.5–15.5)
WBC: 9.4 10*3/uL (ref 4.0–10.5)
nRBC: 0 % (ref 0.0–0.2)

## 2022-12-09 LAB — C-REACTIVE PROTEIN: CRP: 20 mg/dL — ABNORMAL HIGH (ref <1.0)

## 2022-12-09 LAB — PROCALCITONIN: Procalcitonin: 0.35 ng/mL

## 2022-12-09 MED ORDER — POTASSIUM CHLORIDE CRYS ER 20 MEQ PO TBCR
40.0000 meq | EXTENDED_RELEASE_TABLET | ORAL | Status: AC
  Administered 2022-12-09 (×2): 40 meq via ORAL
  Filled 2022-12-09 (×2): qty 2

## 2022-12-09 NOTE — Progress Notes (Signed)
PROGRESS NOTE        PATIENT DETAILS Name: Travis Beck Age: 77 y.o. Sex: male Date of Birth: 06-19-1946 Admit Date: 12/07/2022 Admitting Physician Dewayne Shorter Levora Dredge, MD [id number], Beatrix Fetters, MD  Brief Summary: Patient is a 77 y.o.  male with history of COPD, DM-2, HTN-who was recently hospitalized from 5/23 to 5/26 for septic shock secondary to multilobar pneumonia-requiring vasopressor on admission-stabilized and subsequently discharged home on Augmentin/doxycycline-presented back to the hospital with fever of 103 F and ongoing left-sided pleuritic chest pain.  It appears that his sputum cultures resulted post discharge-and were positive for MRSA.  Significant events: 5/23-5/26>> hospitalization for septic shock from multilobar PNA. 5/30>> fever 103 F at home-ongoing left-sided pleuritic chest pain-admit to [facility].  Significant studies: 5/23>> CTA chest: No PE, consolidation of most left lower lobe. 5/23>> CT renal stone study: Bilateral lower lobe consolidation left> right, no renal stone/hydronephrosis. 5/30>> CXR: Persistent consolidation left lower lobe-improved aeration of the lung bases.  Significant microbiology data: 5/24>> sputum culture: MRSA 5/30>> COVID PCR: Negative  Procedures: None  Consults: None  Subjective: Feels much better-less left-sided pleuritic chest pain.  Some cough.  Objective: Vitals: Blood pressure 125/61, pulse 68, temperature 98.8 F (37.1 C), temperature source Oral, resp. rate 18, height 5\' 5"  (1.651 m), weight 64.2 kg, SpO2 92 %.   Exam: Gen Exam:Alert awake-not in any distress HEENT:atraumatic, normocephalic Chest: B/L clear to auscultation anteriorly CVS:S1S2 regular Abdomen:soft non tender, non distended Extremities:no edema Neurology: Non focal Skin: no rash  Pertinent Labs/Radiology:    Latest Ref Rng & Units 12/09/2022    2:15 AM 12/08/2022    4:49 AM 12/07/2022    1:15 PM  CBC  WBC 4.0 -  10.5 K/uL 9.4  9.4  12.6   Hemoglobin 13.0 - 17.0 g/dL 9.8  47.8  29.5   Hematocrit 39.0 - 52.0 % 29.0  29.5  33.4   Platelets 150 - 400 K/uL 333  356  338     Lab Results  Component Value Date   NA 130 (L) 12/09/2022   K 3.3 (L) 12/09/2022   CL 94 (L) 12/09/2022   CO2 26 12/09/2022      Assessment/Plan: MRSA PNA (sputum culture with MRSA on 5/24) Suspect ongoing symptoms due to incompletely treated PNA-as sputum cultures finalized postdischarge.  Although patient was on doxycycline-do not think he got enough days of MRSA coverage. He is improving-afebrile since initial presentation-no leukocytosis-less pleuritic chest pain.  Plan is to continue Zyvox.   Since clinically improved-do not think he needs any further imaging for now-he will require repeat imaging as an outpatient in 3 to 4 weeks.  Appreciate SLP input-continue regular diet-no overt aspiration signs.    Hypokalemia Continue to bleed/recheck Check magnesium with a.m. labs.  COPD Not in exacerbation Bronchodilators  HTN Stable with Norvasc Resume ARB when able-depending on BP trend  HLD Statin  DM-2 (A1c 6.8 on 5/24) CBG stable with SSI Resume oral hypoglycemics on discharge.  Recent Labs    12/08/22 1628 12/08/22 2151 12/09/22 0805  GLUCAP 140* 227* 193*      Anxiety disorder Xanax as needed-and dose reduced to half of home dosing-given risk for polypharmacy (also on Neurontin/Subutex)  Chronic pain syndrome Mostly back pain Mild pleuritic pain on the left side where he continues to have persistent infiltrates Continue Neurontin/Subutex  Debility/deconditioning  PT/OT eval  BMI: Estimated body mass index is 23.55 kg/m as calculated from the following:   Height as of this encounter: 5\' 5"  (1.651 m).   Weight as of this encounter: 64.2 kg.   Code status:   Code Status: Full Code   DVT Prophylaxis: enoxaparin (LOVENOX) injection 40 mg Start: 12/07/22 2200   Family Communication: None at  bedside   Disposition Plan: Status is: Observation The patient will require care spanning > 2 midnights and should be moved to inpatient because: Severity of illness   Planned Discharge Destination: Home with Home health in the next 1-2 days.   Diet: Diet Order             Diet Carb Modified Fluid consistency: Thin; Room service appropriate? Yes  Diet effective now                     Antimicrobial agents: Anti-infectives (From admission, onward)    Start     Dose/Rate Route Frequency Ordered Stop   12/08/22 0900  linezolid (ZYVOX) IVPB 600 mg        600 mg 300 mL/hr over 60 Minutes Intravenous Every 12 hours 12/08/22 0810 12/15/22 0959   12/08/22 0000  cefTRIAXone (ROCEPHIN) 1 g in sodium chloride 0.9 % 100 mL IVPB  Status:  Discontinued        1 g 200 mL/hr over 30 Minutes Intravenous Every 24 hours 12/07/22 1751 12/08/22 0809   12/08/22 0000  azithromycin (ZITHROMAX) 500 mg in sodium chloride 0.9 % 250 mL IVPB  Status:  Discontinued        500 mg 250 mL/hr over 60 Minutes Intravenous Every 24 hours 12/07/22 1751 12/08/22 0809   12/07/22 1730  ceFEPIme (MAXIPIME) 2 g in sodium chloride 0.9 % 100 mL IVPB        2 g 200 mL/hr over 30 Minutes Intravenous  Once 12/07/22 1716 12/07/22 1915   12/07/22 1730  vancomycin (VANCOREADY) IVPB 1250 mg/250 mL        1,250 mg 166.7 mL/hr over 90 Minutes Intravenous  Once 12/07/22 1716 12/07/22 2016        MEDICATIONS: Scheduled Meds:  albuterol  2.5 mg Nebulization BID   amLODipine  5 mg Oral QHS   arformoterol  15 mcg Nebulization BID   aspirin EC  81 mg Oral Daily   budesonide (PULMICORT) nebulizer solution  0.25 mg Nebulization BID   buprenorphine  4 mg Sublingual QHS   buprenorphine  8 mg Sublingual Daily   enoxaparin (LOVENOX) injection  40 mg Subcutaneous Q24H   guaiFENesin  600 mg Oral BID   insulin aspart  0-5 Units Subcutaneous QHS   insulin aspart  0-9 Units Subcutaneous TID WC   pantoprazole  40 mg Oral Daily    polyethylene glycol  17 g Oral Daily   potassium chloride  40 mEq Oral Q4H   pramipexole  0.5 mg Oral QHS   pravastatin  20 mg Oral QPM   revefenacin  175 mcg Nebulization Daily   senna-docusate  1 tablet Oral QHS   Continuous Infusions:  linezolid (ZYVOX) IV 600 mg (12/09/22 0926)   PRN Meds:.acetaminophen **OR** acetaminophen, ALPRAZolam, gabapentin, hydrALAZINE, ipratropium-albuterol, traZODone   I have personally reviewed following labs and imaging studies  LABORATORY DATA: CBC: Recent Labs  Lab 12/03/22 0258 12/07/22 1315 12/08/22 0449 12/09/22 0215  WBC 10.1 12.6* 9.4 9.4  NEUTROABS  --  10.3*  --   --   HGB 10.9*  10.9* 10.0* 9.8*  HCT 32.3* 33.4* 29.5* 29.0*  MCV 87.3 90.8 88.3 89.8  PLT 235 338 356 333     Basic Metabolic Panel: Recent Labs  Lab 12/03/22 0258 12/07/22 1315 12/08/22 0449 12/09/22 0215  NA 132* 131* 134* 130*  K 4.4 3.3* 2.9* 3.3*  CL 99 91* 96* 94*  CO2 25 27 26 26   GLUCOSE 248* 170* 97 198*  BUN 18 14 11  7*  CREATININE 0.89 0.82 0.76 0.78  CALCIUM 8.3* 8.2* 7.6* 7.7*  MG 1.9  --   --   --      GFR: Estimated Creatinine Clearance: 66.2 mL/min (by C-G formula based on SCr of 0.78 mg/dL).  Liver Function Tests: Recent Labs  Lab 12/03/22 0258  AST 11*  ALT 13  ALKPHOS 44  BILITOT 0.8  PROT 5.5*  ALBUMIN 2.3*    No results for input(s): "LIPASE", "AMYLASE" in the last 168 hours. No results for input(s): "AMMONIA" in the last 168 hours.  Coagulation Profile: No results for input(s): "INR", "PROTIME" in the last 168 hours.  Cardiac Enzymes: No results for input(s): "CKTOTAL", "CKMB", "CKMBINDEX", "TROPONINI" in the last 168 hours.  BNP (last 3 results) No results for input(s): "PROBNP" in the last 8760 hours.  Lipid Profile: No results for input(s): "CHOL", "HDL", "LDLCALC", "TRIG", "CHOLHDL", "LDLDIRECT" in the last 72 hours.  Thyroid Function Tests: No results for input(s): "TSH", "T4TOTAL", "FREET4", "T3FREE",  "THYROIDAB" in the last 72 hours.  Anemia Panel: No results for input(s): "VITAMINB12", "FOLATE", "FERRITIN", "TIBC", "IRON", "RETICCTPCT" in the last 72 hours.  Urine analysis:    Component Value Date/Time   COLORURINE YELLOW 11/30/2022 1426   APPEARANCEUR CLEAR 11/30/2022 1426   LABSPEC 1.012 11/30/2022 1426   PHURINE 7.0 11/30/2022 1426   GLUCOSEU NEGATIVE 11/30/2022 1426   GLUCOSEU >=1000 (A) 04/12/2022 1020   HGBUR NEGATIVE 11/30/2022 1426   BILIRUBINUR NEGATIVE 11/30/2022 1426   KETONESUR NEGATIVE 11/30/2022 1426   PROTEINUR NEGATIVE 11/30/2022 1426   UROBILINOGEN 0.2 04/12/2022 1020   NITRITE NEGATIVE 11/30/2022 1426   LEUKOCYTESUR NEGATIVE 11/30/2022 1426    Sepsis Labs: Lactic Acid, Venous    Component Value Date/Time   LATICACIDVEN 1.5 12/02/2022 0047    MICROBIOLOGY: Recent Results (from the past 240 hour(s))  Blood culture (routine x 2)     Status: None   Collection Time: 11/30/22  2:26 PM   Specimen: BLOOD  Result Value Ref Range Status   Specimen Description BLOOD RIGHT ANTECUBITAL  Final   Special Requests   Final    BOTTLES DRAWN AEROBIC AND ANAEROBIC Blood Culture adequate volume   Culture   Final    NO GROWTH 5 DAYS Performed at [facility], [address] [place], [place] [address]    Report Status 12/05/2022 FINAL  Final  Blood culture (routine x 2)     Status: None   Collection Time: 11/30/22  4:03 PM   Specimen: BLOOD  Result Value Ref Range Status   Specimen Description BLOOD BLOOD LEFT FOREARM  Final   Special Requests   Final    BOTTLES DRAWN AEROBIC AND ANAEROBIC Blood Culture adequate volume   Culture   Final    NO GROWTH 5 DAYS Performed at [facility], [address] [place], [place] [address]    Report Status 12/05/2022 FINAL  Final  SARS Coronavirus 2 by RT PCR (hospital order, performed in [facility] hospital lab) *cepheid single result test* Anterior Nasal Swab     Status:  None   Collection Time: 11/30/22  5:13 PM    Specimen: Anterior Nasal Swab  Result Value Ref Range Status   SARS Coronavirus 2 by RT PCR NEGATIVE NEGATIVE Final    Comment: (NOTE) SARS-CoV-2 target nucleic acids are NOT DETECTED.  The SARS-CoV-2 RNA is generally detectable in upper and lower respiratory specimens during the acute phase of infection. The lowest concentration of SARS-CoV-2 viral copies this assay can detect is 250 copies / mL. A negative result does not preclude SARS-CoV-2 infection and should not be used as the sole basis for treatment or other patient management decisions.  A negative result may occur with improper specimen collection / handling, submission of specimen other than nasopharyngeal swab, presence of viral mutation(s) within the areas targeted by this assay, and inadequate number of viral copies (<250 copies / mL). A negative result must be combined with clinical observations, patient history, and epidemiological information.  Fact Sheet for Patients:   RoadLapTop.co.za  Fact Sheet for Healthcare Providers: http://kim-miller.com/  This test is not yet approved or  cleared by the [place] FDA and has been authorized for detection and/or diagnosis of SARS-CoV-2 by FDA under an Emergency Use Authorization (EUA).  This EUA will remain in effect (meaning this test can be used) for the duration of the COVID-19 declaration under Section 564(b)(1) of the Act, 21 U.S.C. section 360bbb-3(b)(1), unless the authorization is terminated or revoked sooner.  Performed at [facility], [address], [place], [place] [address]   Respiratory (~20 pathogens) panel by PCR     Status: None   Collection Time: 11/30/22  5:13 PM   Specimen: Nasopharyngeal Swab; Respiratory  Result Value Ref Range Status   Adenovirus NOT DETECTED NOT DETECTED Final   Coronavirus 229E NOT DETECTED NOT DETECTED Final    Comment: (NOTE) The Coronavirus on the Respiratory Panel, DOES NOT  test for the novel  Coronavirus (2019 nCoV)    Coronavirus HKU1 NOT DETECTED NOT DETECTED Final   Coronavirus NL63 NOT DETECTED NOT DETECTED Final   Coronavirus OC43 NOT DETECTED NOT DETECTED Final   Metapneumovirus NOT DETECTED NOT DETECTED Final   Rhinovirus / Enterovirus NOT DETECTED NOT DETECTED Final   Influenza A NOT DETECTED NOT DETECTED Final   Influenza B NOT DETECTED NOT DETECTED Final   Parainfluenza Virus 1 NOT DETECTED NOT DETECTED Final   Parainfluenza Virus 2 NOT DETECTED NOT DETECTED Final   Parainfluenza Virus 3 NOT DETECTED NOT DETECTED Final   Parainfluenza Virus 4 NOT DETECTED NOT DETECTED Final   Respiratory Syncytial Virus NOT DETECTED NOT DETECTED Final   Bordetella pertussis NOT DETECTED NOT DETECTED Final   Bordetella Parapertussis NOT DETECTED NOT DETECTED Final   Chlamydophila pneumoniae NOT DETECTED NOT DETECTED Final   Mycoplasma pneumoniae NOT DETECTED NOT DETECTED Final    Comment: Performed at [facility], [address], [place], [place] [address]  MRSA Next Gen by PCR, Nasal     Status: Abnormal   Collection Time: 11/30/22  8:05 PM   Specimen: Nasal Mucosa; Nasal Swab  Result Value Ref Range Status   MRSA by PCR Next Gen DETECTED (A) NOT DETECTED Final    Comment: RESULT CALLED TO, READ BACK BY AND VERIFIED WITH: S. BHATTARAI AT 0122 ON 05.24.24 BY ADGER J         The GeneXpert MRSA Assay (FDA approved for NASAL specimens only), is one component of a comprehensive MRSA colonization surveillance program. It is not intended to diagnose MRSA infection  nor to guide or monitor treatment for MRSA infections. Performed at [facility], [address], [place], [place] [address]   Culture, Respiratory w Gram Stain     Status: None   Collection Time: 12/01/22  1:17 PM   Specimen: Expectorated Sputum; Respiratory  Result Value Ref Range Status   Specimen Description EXPECTORATED SPUTUM  Final   Special Requests NONE  Final   Gram Stain    Final    NO WBC SEEN FEW GRAM POSITIVE COCCI RARE BUDDING YEAST SEEN Performed at [facility], [address] [place], [place] [address]    Culture   Final    MODERATE METHICILLIN RESISTANT STAPHYLOCOCCUS AUREUS   Report Status 12/04/2022 FINAL  Final   Organism ID, Bacteria METHICILLIN RESISTANT STAPHYLOCOCCUS AUREUS  Final      Susceptibility   Methicillin resistant staphylococcus aureus - MIC*    CIPROFLOXACIN <=0.5 SENSITIVE Sensitive     ERYTHROMYCIN >=8 RESISTANT Resistant     GENTAMICIN <=0.5 SENSITIVE Sensitive     OXACILLIN >=4 RESISTANT Resistant     TETRACYCLINE <=1 SENSITIVE Sensitive     VANCOMYCIN <=0.5 SENSITIVE Sensitive     TRIMETH/SULFA <=10 SENSITIVE Sensitive     CLINDAMYCIN <=0.25 SENSITIVE Sensitive     RIFAMPIN <=0.5 SENSITIVE Sensitive     Inducible Clindamycin NEGATIVE Sensitive     LINEZOLID 2 SENSITIVE Sensitive     * MODERATE METHICILLIN RESISTANT STAPHYLOCOCCUS AUREUS  Expectorated Sputum Assessment w Gram Stain, Rflx to Resp Cult     Status: None   Collection Time: 12/01/22  1:17 PM   Specimen: Sputum  Result Value Ref Range Status   Specimen Description SPU  Final   Special Requests NONE  Final   Sputum evaluation   Final    THIS SPECIMEN IS ACCEPTABLE FOR SPUTUM CULTURE Performed at [facility], [address], [place], [place] [address]    Report Status 12/01/2022 FINAL  Final  SARS Coronavirus 2 by RT PCR (hospital order, performed in [facility] hospital lab) *cepheid single result test* Anterior Nasal Swab     Status: None   Collection Time: 12/07/22  5:11 PM   Specimen: Anterior Nasal Swab  Result Value Ref Range Status   SARS Coronavirus 2 by RT PCR NEGATIVE NEGATIVE Final    Comment: Performed at [facility], [address], [place], [place] [address]  Expectorated Sputum Assessment w Gram Stain, Rflx to Resp Cult     Status: None   Collection Time: 12/07/22  6:42 PM   Specimen: Expectorated Sputum  Result Value  Ref Range Status   Specimen Description EXPECTORATED SPUTUM  Final   Special Requests NONE  Final   Sputum evaluation   Final    Sputum specimen not acceptable for testing.  Please recollect.   notified RN KYRA H ON 12/08/22 @ 1824 BY DRT Performed at [facility], [address], [place], [place] [address]    Report Status 12/08/2022 FINAL  Final  MRSA Next Gen by PCR, Nasal     Status: Abnormal   Collection Time: 12/07/22 10:00 PM   Specimen: Nasal Mucosa; Nasal Swab  Result Value Ref Range Status   MRSA by PCR Next Gen DETECTED (A) NOT DETECTED Final    Comment: RESULTS CALLED TO. READ BACK BY AND VERIFIED WITH RN R.GININWA ON 12/08/22 AT 0153 BY NM  (NOTE) The GeneXpert MRSA Assay (FDA approved for NASAL specimens only), is one component of a  comprehensive MRSA colonization surveillance program. It is not intended to diagnose MRSA infection nor to guide or monitor treatment for MRSA infections. Test performance is not FDA approved in patients less than 29 years old. Performed at [facility], [address] [place], [place] [address]     RADIOLOGY STUDIES/RESULTS: DG Chest 2 View  Result Date: 12/07/2022 CLINICAL DATA:  Shortness of breath. EXAM: CHEST - 2 VIEW COMPARISON:  12/02/2022. FINDINGS: Improved aeration of the lung bases with persistent consolidation in the left lower lobe. Trace bilateral pleural effusions. No pneumothorax. Stable cardiac and mediastinal contours. IMPRESSION: Improved aeration of the lung bases with persistent consolidation in the left lower lobe. Trace bilateral pleural effusions. Electronically Signed   By: Orvan Falconer M.D.   On: 12/07/2022 13:42     LOS: 1 day   Jeoffrey Massed, MD  [facility]    To contact the attending provider between 7A-7P or the covering provider during after hours 7P-7A, please log into the web site [email or web address] and access using universal [place] password for that web site. If you do not have  the password, please call the hospital operator.  12/09/2022, 11:39 AM

## 2022-12-10 ENCOUNTER — Inpatient Hospital Stay (HOSPITAL_COMMUNITY): Payer: Medicare Other

## 2022-12-10 DIAGNOSIS — J90 Pleural effusion, not elsewhere classified: Secondary | ICD-10-CM | POA: Diagnosis not present

## 2022-12-10 DIAGNOSIS — J189 Pneumonia, unspecified organism: Secondary | ICD-10-CM | POA: Diagnosis not present

## 2022-12-10 LAB — GLUCOSE, CAPILLARY
Glucose-Capillary: 178 mg/dL — ABNORMAL HIGH (ref 70–99)
Glucose-Capillary: 219 mg/dL — ABNORMAL HIGH (ref 70–99)
Glucose-Capillary: 134 mg/dL — ABNORMAL HIGH (ref 70–99)
Glucose-Capillary: 192 mg/dL — ABNORMAL HIGH (ref 70–99)

## 2022-12-10 LAB — CBC
HCT: 30.9 % — ABNORMAL LOW (ref 39.0–52.0)
Hemoglobin: 10.1 g/dL — ABNORMAL LOW (ref 13.0–17.0)
MCH: 28.9 pg (ref 26.0–34.0)
MCHC: 32.7 g/dL (ref 30.0–36.0)
MCV: 88.5 fL (ref 80.0–100.0)
Platelets: 416 10*3/uL — ABNORMAL HIGH (ref 150–400)
RBC: 3.49 MIL/uL — ABNORMAL LOW (ref 4.22–5.81)
RDW: 13.3 % (ref 11.5–15.5)
WBC: 9.7 10*3/uL (ref 4.0–10.5)
nRBC: 0 % (ref 0.0–0.2)

## 2022-12-10 LAB — MAGNESIUM: Magnesium: 1.9 mg/dL (ref 1.7–2.4)

## 2022-12-10 LAB — PROCALCITONIN: Procalcitonin: 0.32 ng/mL

## 2022-12-10 LAB — C-REACTIVE PROTEIN: CRP: 17.5 mg/dL — ABNORMAL HIGH (ref <1.0)

## 2022-12-10 LAB — BASIC METABOLIC PANEL
Anion gap: 8 (ref 5–15)
BUN: 8 mg/dL (ref 8–23)
CO2: 28 mmol/L (ref 22–32)
Calcium: 7.9 mg/dL — ABNORMAL LOW (ref 8.9–10.3)
Chloride: 96 mmol/L — ABNORMAL LOW (ref 98–111)
Creatinine, Ser: 0.69 mg/dL (ref 0.61–1.24)
GFR, Estimated: >60 mL/min (ref >60)
Glucose, Bld: 187 mg/dL — ABNORMAL HIGH (ref 70–99)
Potassium: 4.6 mmol/L (ref 3.5–5.1)
Sodium: 132 mmol/L — ABNORMAL LOW (ref 135–145)

## 2022-12-10 LAB — BRAIN NATRIURETIC PEPTIDE: B Natriuretic Peptide: 241.4 pg/mL — ABNORMAL HIGH (ref 0.0–100.0)

## 2022-12-10 MED ORDER — SALINE SPRAY 0.65 % NA SOLN: 1.0000 | NASAL | Status: DC | PRN

## 2022-12-10 NOTE — Progress Notes (Signed)
PROGRESS NOTE        PATIENT DETAILS Name: Travis Beck Age: 77 y.o. Sex: male Date of Birth: Apr 05, 1946 Admit Date: 12/07/2022 Admitting Physician Dewayne Shorter Levora Dredge, MD ZOX:WRUE, Beatrix Fetters, MD  Brief Summary: Patient is a 77 y.o.  male with history of COPD, DM-2, HTN-who was recently hospitalized from 5/23 to 5/26 for septic shock secondary to multilobar pneumonia-requiring vasopressor on admission-stabilized and subsequently discharged home on Augmentin/doxycycline-presented back to the hospital with fever of 103 F and ongoing left-sided pleuritic chest pain.  It appears that his sputum cultures resulted post discharge-and were positive for MRSA.  Significant events: 5/23-5/26>> hospitalization for septic shock from multilobar PNA. 5/30>> fever 103 F at home-ongoing left-sided pleuritic chest pain-admit to [facility].  Significant studies: 5/23>> CTA chest: No PE, consolidation of most left lower lobe. 5/23>> CT renal stone study: Bilateral lower lobe consolidation left> right, no renal stone/hydronephrosis. 5/30>> CXR: Persistent consolidation left lower lobe-improved aeration of the lung bases.  Significant microbiology data: 5/24>> sputum culture: MRSA 5/30>> COVID PCR: Negative  Procedures: None  Consults: None   Subjective:   Patient in bed denies any headache, still has left-sided pleuritic chest pain with cough and taking deep breaths, no shortness of breath, no abdominal pain or focal weakness.  Objective: Vitals: Blood pressure (!) 140/70, pulse 75, temperature 99 F (37.2 C), temperature source Oral, resp. rate 16, height 5\' 5"  (1.651 m), weight 64.2 kg, SpO2 90 %.   Exam:  Awake Alert, No new F.N deficits, Normal affect [place].AT,PERRAL Supple Neck, No JVD,   Symmetrical Chest wall movement, Good air movement bilaterally, CTAB RRR,No Gallops, Rubs or new Murmurs,  +ve B.Sounds, Abd Soft, No tenderness,   No Cyanosis, Clubbing or  edema   Assessment/Plan:  MRSA PNA (sputum culture with MRSA on 5/24) Suspect ongoing symptoms due to incompletely treated PNA-as sputum cultures finalized postdischarge.  Although patient was on doxycycline-do not think he got enough days of MRSA coverage. He is improving-afebrile since initial presentation-no leukocytosis-less pleuritic chest pain.  Plan is to continue Zyvox.  He is still not feeling well, having a lot of left-sided pleuritic chest pain, encouraged to sit up in chair use I-S and flutter valve which have been added for pulmonary toiletry.  If pain continues will have low threshold for obtaining a CT chest.  Hypokalemia Continue to bleed/recheck Check magnesium with a.m. labs.  COPD Not in exacerbation Bronchodilators  HTN Stable with Norvasc Resume ARB when able-depending on BP trend  HLD Statin  Anxiety disorder Xanax as needed-and dose reduced to half of home dosing-given risk for polypharmacy (also on Neurontin/Subutex)  Chronic pain syndrome Mostly back pain Mild pleuritic pain on the left side where he continues to have persistent infiltrates Continue Neurontin/Subutex  Debility/deconditioning PT/OT eval  DM-2 (A1c 6.8 on 5/24) CBG stable with SSI Resume oral hypoglycemics on discharge.  Recent Labs    12/09/22 1633 12/09/22 2037 12/10/22 0753  GLUCAP 210* 172* 178*       BMI: Estimated body mass index is 23.55 kg/m as calculated from the following:   Height as of this encounter: 5\' 5"  (1.651 m).   Weight as of this encounter: 64.2 kg.   Code status:   Code Status: Full Code   DVT Prophylaxis: enoxaparin (LOVENOX) injection 40 mg Start: 12/07/22 2200   Family Communication: None at bedside  Disposition Plan: Status is: Observation The patient will require care spanning > 2 midnights and should be moved to inpatient because: Severity of illness   Planned Discharge Destination: Home with Home health in the next 1-2  days.   Diet: Diet Order             Diet Carb Modified Fluid consistency: Thin; Room service appropriate? Yes  Diet effective now                    MEDICATIONS: Scheduled Meds:  albuterol  2.5 mg Nebulization BID   amLODipine  5 mg Oral QHS   arformoterol  15 mcg Nebulization BID   aspirin EC  81 mg Oral Daily   budesonide (PULMICORT) nebulizer solution  0.25 mg Nebulization BID   buprenorphine  4 mg Sublingual QHS   buprenorphine  8 mg Sublingual Daily   enoxaparin (LOVENOX) injection  40 mg Subcutaneous Q24H   guaiFENesin  600 mg Oral BID   insulin aspart  0-5 Units Subcutaneous QHS   insulin aspart  0-9 Units Subcutaneous TID WC   pantoprazole  40 mg Oral Daily   polyethylene glycol  17 g Oral Daily   pramipexole  0.5 mg Oral QHS   pravastatin  20 mg Oral QPM   revefenacin  175 mcg Nebulization Daily   senna-docusate  1 tablet Oral QHS   Continuous Infusions:  linezolid (ZYVOX) IV 600 mg (12/09/22 2118)   PRN Meds:.acetaminophen **OR** acetaminophen, ALPRAZolam, gabapentin, hydrALAZINE, ipratropium-albuterol, traZODone   I have personally reviewed following labs and imaging studies  LABORATORY DATA:  Recent Labs  Lab 12/07/22 1315 12/08/22 0449 12/09/22 0215 12/10/22 0326  WBC 12.6* 9.4 9.4 9.7  HGB 10.9* 10.0* 9.8* 10.1*  HCT 33.4* 29.5* 29.0* 30.9*  PLT 338 356 333 416*  MCV 90.8 88.3 89.8 88.5  MCH 29.6 29.9 30.3 28.9  MCHC 32.6 33.9 33.8 32.7  RDW 13.2 13.3 13.2 13.3  LYMPHSABS 1.1  --   --   --   MONOABS 0.8  --   --   --   EOSABS 0.2  --   --   --   BASOSABS 0.1  --   --   --     Recent Labs  Lab 12/07/22 1315 12/08/22 0449 12/09/22 0215 12/09/22 1531 12/10/22 0326  NA 131* 134* 130*  --  132*  K 3.3* 2.9* 3.3*  --  4.6  CL 91* 96* 94*  --  96*  CO2 27 26 26   --  28  ANIONGAP 13 12 10   --  8  GLUCOSE 170* 97 198*  --  187*  BUN 14 11 7*  --  8  CREATININE 0.82 0.76 0.78  --  0.69  CRP  --   --   --  20.0* 17.5*  PROCALCITON  0.83 1.00  --  0.35 0.32  BNP 99.0  --   --   --  241.4*  MG  --   --   --   --  1.9  CALCIUM 8.2* 7.6* 7.7*  --  7.9*    No results found for: "CHOL", "HDL", "LDLCALC", "LDLDIRECT", "TRIG", "CHOLHDL"    Recent Labs  Lab 12/07/22 1315 12/08/22 0449 12/09/22 0215 12/09/22 1531 12/10/22 0326  CRP  --   --   --  20.0* 17.5*  PROCALCITON 0.83 1.00  --  0.35 0.32  BNP 99.0  --   --   --  241.4*  MG  --   --   --   --  1.9  CALCIUM 8.2* 7.6* 7.7*  --  7.9*    RADIOLOGY STUDIES/RESULTS: DG Chest Port 1 View  Result Date: 12/10/2022 CLINICAL DATA:  [id number] with shortness of breath. EXAM: PORTABLE CHEST 1 VIEW COMPARISON:  AP Lat 12/07/2022 FINDINGS: There is mild cardiomegaly without overt CHF. There is persistent left lower lobe consolidation. Small focal airspace disease peripherally in the left upper lobe. Aeration in the left lung is not notably changed. There is increased patchy consolidation in the right lower lung field. Right upper lung field is clear. There are small pleural effusions which appear similar. The mediastinum is normally outlined with patchy aortic calcifications. There is thoracic spondylosis.  Multiple overlying telemetry leads. IMPRESSION: 1. Increased patchy consolidation in the right lower lung field. 2. No significant change in left lower lobe consolidation and small focal peripheral left upper lobe opacity. 3. Stable small pleural effusions. 4. Mild cardiomegaly without overt CHF. 5. Aortic atherosclerosis. Electronically Signed   By: Almira Bar M.D.   On: 12/10/2022 07:41     LOS: 2 days   Signature  -    Susa Raring M.D on 12/10/2022 at 8:25 AM   -  To page go to [email or web address]

## 2022-12-10 NOTE — Progress Notes (Signed)
Mobility Specialist Progress Note   12/10/22 0945  Mobility  Activity Ambulated with assistance in hallway  Level of Assistance Contact guard assist, steadying assist  Assistive Device Front wheel walker  Distance Ambulated (ft) 480 ft  Range of Motion/Exercises Active;All extremities  Activity Response Tolerated well   Patient received in supine and agreeable to participate. Ambulated min guard to supervision with slow steady gait. Returned to room without complaint or incident. Noted oxygen saturation lingering 88-90% upon returning to room. Was left dangling EOB with all needs met, call bell in reach.   Travis Beck, BS EXP Mobility Specialist Please contact via SecureChat or Rehab office at [phone]

## 2022-12-10 NOTE — Consult Note (Addendum)
NAME:  Travis Beck, MRN:  [id number], DOB:  12-21-1945, LOS: 2 ADMISSION DATE:  12/07/2022, CONSULTATION DATE:  6/2  REFERRING MD: Thedore Mins CHIEF COMPLAINT:  Fever, left sided chest pain   History of Present Illness:  Pt is a 77 yr old male with significant pmhx of COPD, DM type 2, HTN, and with recent hospitalization from 5/23 to 5/26 for septic shock requiring pressors due to multilobar pna who returns back to the hospital for fever of 103 and with frequent left sided pleuritic chest pain on 5/30.  PCCM was consulted due to ongoing pleuritic pain and concerns of empyema on CT chest completed 12/10/22.   Pertinent  Medical History  COPD Asthma HTH Type 2 Diabetes   Significant Hospital Events: Including procedures, antibiotic start and stop dates in addition to other pertinent events   5/30 admitted with fever and left sided pleuritic pain  6/2 Consult for concern for left empyema   Interim History / Subjective:  Patient alert, following commands, states that "left pleurtic pain has improved."  Objective   Blood pressure 125/68, pulse 75, temperature 99 F (37.2 C), temperature source Oral, resp. rate 18, height 5\' 5"  (1.651 m), weight 64.2 kg, SpO2 93 %.    FiO2 (%):  [21 %] 21 %   Intake/Output Summary (Last 24 hours) at 12/10/2022 1324 Last data filed at 12/10/2022 1159 Gross per 24 hour  Intake 240 ml  Output 550 ml  Net -310 ml   Filed Weights   12/07/22 1247  Weight: 64.2 kg    Examination: General: calm pleasant, older adult male, sitting up in bed HENT: Normocephalic, MM pink moist  Lungs: CTA, diminished in lower bases  Cardiovascular:  s1, s2 auscultated Abdomen: BS active  Extremities: moves all extremities  Neuro: Alert oriented x 4  GU: deferred   Resolved Hospital Problem list     Assessment & Plan:  Parapneumonic effusion vs empyema secondary to MRSA Pneumonia  Sputum culture on 5/24 revealed MRSA CT of chest concerns for left side empyema/  loculations of pleural effusion  Bedside thoracic ultrasound of left, revealed small loculated effusions, not enough to drain at this time or place chest tube  P: Continue Linezolid for MRSA coverage  Encourage mobilization and pulmonary hygiene  Encourage IS, and flutter valve, sit up in chair  Reimage in a 48hrs or clinical declines   Best Practice (right click and "Reselect all SmartList Selections" daily)   Diet/type: refer to primary  DVT prophylaxis: refer to primary  GI prophylaxis:  refer to primary  Lines: n/a Foley:  n/a Code Status:  Full Last date of multidisciplinary goals of care discussion [patient updated at beside]   Labs   CBC: Recent Labs  Lab 12/07/22 1315 12/08/22 0449 12/09/22 0215 12/10/22 0326  WBC 12.6* 9.4 9.4 9.7  NEUTROABS 10.3*  --   --   --   HGB 10.9* 10.0* 9.8* 10.1*  HCT 33.4* 29.5* 29.0* 30.9*  MCV 90.8 88.3 89.8 88.5  PLT 338 356 333 416*    Basic Metabolic Panel: Recent Labs  Lab 12/07/22 1315 12/08/22 0449 12/09/22 0215 12/10/22 0326  NA 131* 134* 130* 132*  K 3.3* 2.9* 3.3* 4.6  CL 91* 96* 94* 96*  CO2 27 26 26 28   GLUCOSE 170* 97 198* 187*  BUN 14 11 7* 8  CREATININE 0.82 0.76 0.78 0.69  CALCIUM 8.2* 7.6* 7.7* 7.9*  MG  --   --   --  1.9  GFR: Estimated Creatinine Clearance: 66.2 mL/min (by C-G formula based on SCr of 0.69 mg/dL). Recent Labs  Lab 12/07/22 1315 12/08/22 0449 12/09/22 0215 12/09/22 1531 12/10/22 0326  PROCALCITON 0.83 1.00  --  0.35 0.32  WBC 12.6* 9.4 9.4  --  9.7    Liver Function Tests: No results for input(s): "AST", "ALT", "ALKPHOS", "BILITOT", "PROT", "ALBUMIN" in the last 168 hours. No results for input(s): "LIPASE", "AMYLASE" in the last 168 hours. No results for input(s): "AMMONIA" in the last 168 hours.  ABG    Component Value Date/Time   PHART 7.41 12/01/2022 0745   PCO2ART 35 12/01/2022 0745   PO2ART 133 (H) 12/01/2022 0745   HCO3 22.2 12/01/2022 0745   ACIDBASEDEF 1.9  12/01/2022 0745   O2SAT 99.3 12/01/2022 0745     Coagulation Profile: No results for input(s): "INR", "PROTIME" in the last 168 hours.  Cardiac Enzymes: No results for input(s): "CKTOTAL", "CKMB", "CKMBINDEX", "TROPONINI" in the last 168 hours.  HbA1C: Hgb A1c MFr Bld  Date/Time Value Ref Range Status  12/01/2022 05:41 AM 6.8 (H) 4.8 - 5.6 % Final    Comment:    (NOTE)         Prediabetes: 5.7 - 6.4         Diabetes: >6.4         Glycemic control for adults with diabetes: <7.0     CBG: Recent Labs  Lab 12/09/22 1206 12/09/22 1633 12/09/22 2037 12/10/22 0753 12/10/22 1155  GLUCAP 215* 210* 172* 178* 219*    Review of Systems:   Please see the history of present illness. All other systems reviewed and are negative .   Past Medical History:  He,  has a past medical history of Arthritis, Asthma, Emphysema lung (HCC), Essential hypertension, Ischemic heart disease, and Type 2 diabetes mellitus (HCC).   Surgical History:   Past Surgical History:  Procedure Laterality Date   BIOPSY  08/30/2018   Procedure: BIOPSY;  Surgeon: Malissa Hippo, MD;  [facility];  Service: Endoscopy;;  gastric    BIOPSY  11/05/2020   Procedure: BIOPSY;  Surgeon: Dolores Frame, MD;  [facility];  Service: Gastroenterology;;   COLONOSCOPY     COLONOSCOPY WITH PROPOFOL N/A 11/05/2020   Procedure: COLONOSCOPY WITH PROPOFOL;  Surgeon: Dolores Frame, MD;  [facility];  Service: Gastroenterology;  Laterality: N/A;  Am   ESOPHAGEAL DILATION N/A 05/21/2019   Procedure: ESOPHAGEAL DILATION;  Surgeon: Malissa Hippo, MD;  [facility];  Service: Endoscopy;  Laterality: N/A;   ESOPHAGOGASTRODUODENOSCOPY N/A 05/21/2019   Procedure: ESOPHAGOGASTRODUODENOSCOPY (EGD);  Surgeon: Malissa Hippo, MD;  [facility];  Service: Endoscopy;  Laterality: N/A;  730   ESOPHAGOGASTRODUODENOSCOPY (EGD) WITH PROPOFOL N/A 08/30/2018    Procedure: ESOPHAGOGASTRODUODENOSCOPY (EGD) WITH PROPOFOL;  Surgeon: Malissa Hippo, MD;  [facility];  Service: Endoscopy;  Laterality: N/A;  1:30   ESOPHAGOGASTRODUODENOSCOPY (EGD) WITH PROPOFOL N/A 11/05/2020   Procedure: ESOPHAGOGASTRODUODENOSCOPY (EGD) WITH PROPOFOL;  Surgeon: Dolores Frame, MD;  [facility];  Service: Gastroenterology;  Laterality: N/A;   HERNIA REPAIR     bilateral lower abdomen   POLYPECTOMY  11/05/2020   Procedure: POLYPECTOMY;  Surgeon: Dolores Frame, MD;  [facility];  Service: Gastroenterology;;   Gaspar Bidding DILATION  11/05/2020   Procedure: Gaspar Bidding DILATION;  Surgeon: Marguerita Merles, Reuel Boom, MD;  [facility];  Service: Gastroenterology;;  Social History:   reports that he quit smoking about 29 years ago. His smoking use included cigarettes. He has a 66.00 pack-year smoking history. He has never used smokeless tobacco. He reports that he does not currently use alcohol. He reports that he does not use drugs.   Family History:  His family history is not on file.   Allergies Allergies  Allergen Reactions   Penicillins Rash    No problems with ampicillin during hospitalization     Home Medications  Prior to Admission medications   Medication Sig Start Date End Date Taking? Authorizing Provider  acetaminophen (TYLENOL) 325 MG tablet Take 2 tablets (650 mg total) by mouth every 6 (six) hours as needed for mild pain (or Fever >/= 101). 11/26/19  Yes Emokpae, Courage, MD  albuterol (VENTOLIN HFA) 108 (90 Base) MCG/ACT inhaler Inhale 2 puffs into the lungs every 4 (four) hours as needed for wheezing or shortness of breath. 11/26/19  Yes Emokpae, Courage, MD  ALPRAZolam Prudy Feeler) 1 MG tablet Take 1 tablet (1 mg total) by mouth 3 (three) times daily as needed for anxiety. 01/28/19  Yes Welborn, Ryan, DO  amLODipine (NORVASC) 5 MG tablet Take 5 mg by mouth at bedtime. 11/06/22  Yes [provider]  buprenorphine (SUBUTEX) 8 MG SUBL SL tablet Place 4-8 mg under the tongue See admin instructions. Take 8 mg by mouth in the morning and then take 4 mg by mouth at bedtime. 01/04/19  Yes [provider]  cetirizine (ZYRTEC) 10 MG tablet Take 10 mg by mouth as needed for allergies (itching).   Yes [provider]  fluticasone (FLONASE) 50 MCG/ACT nasal spray Place 2 sprays into both nostrils daily.  08/22/18  Yes [provider]  gabapentin (NEURONTIN) 400 MG capsule Take 1 capsule (400 mg total) by mouth every 8 (eight) hours as needed. Patient taking differently: Take 400 mg by mouth every 8 (eight) hours as needed (pain). 01/28/19  Yes Welborn, Ryan, DO  glipiZIDE (GLUCOTROL XL) 10 MG 24 hr tablet Take 10 mg by mouth daily. 11/06/22  Yes [provider]  metFORMIN (GLUCOPHAGE) 1000 MG tablet Take 1,000 mg by mouth 2 (two) times daily with a meal.  12/07/14  Yes [provider]  montelukast (SINGULAIR) 10 MG tablet Take 1 tablet (10 mg total) by mouth at bedtime. 06/15/22  Yes Mannam, Praveen, MD  pantoprazole (PROTONIX) 40 MG tablet Take 1 tablet (40 mg total) by mouth daily. 12/15/20  Yes Dolores Frame, MD  pramipexole (MIRAPEX) 0.5 MG tablet Take 0.5 mg by mouth at bedtime. 11/04/22  Yes [provider]  pravastatin (PRAVACHOL) 20 MG tablet Take 20 mg by mouth every evening.   Yes [provider]  valsartan (DIOVAN) 160 MG tablet Take 160 mg by mouth daily. 11/06/22  Yes [provider]  aspirin EC 81 MG tablet Take 1 tablet (81 mg total) by mouth daily. Swallow whole. Patient not taking: Reported on 11/30/2022 09/30/20   Jonelle Sidle, MD  Fluticasone-Umeclidin-Vilant (TRELEGY ELLIPTA) 100-62.5-25 MCG/ACT AEPB Inhale 1 puff into the lungs daily. Patient not taking: Reported on 12/02/2022 06/15/22   Chilton Greathouse, MD     Critical care time: 30 mins    CRITICAL CARE Performed by: Rochel Brome S-ACNP    Total  critical care time: 35 minutes  Critical care time was exclusive of separately billable procedures and treating other patients.  Critical care was necessary to treat or prevent imminent or life-threatening deterioration.  Critical  care was time spent personally by me on the following activities: development of treatment plan with patient and/or surrogate as well as nursing, discussions with consultants, evaluation of patient's response to treatment, examination of patient, obtaining history from patient or surrogate, ordering and performing treatments and interventions, ordering and review of laboratory studies, ordering and review of radiographic studies, pulse oximetry and re-evaluation of patient's condition.

## 2022-12-11 ENCOUNTER — Inpatient Hospital Stay (HOSPITAL_COMMUNITY): Payer: Medicare Other

## 2022-12-11 DIAGNOSIS — J189 Pneumonia, unspecified organism: Secondary | ICD-10-CM | POA: Diagnosis not present

## 2022-12-11 LAB — LEGIONELLA PNEUMOPHILA SEROGP 1 UR AG: L. pneumophila Serogp 1 Ur Ag: NEGATIVE

## 2022-12-11 LAB — URINALYSIS, W/ REFLEX TO CULTURE (INFECTION SUSPECTED)
Bacteria, UA: NONE SEEN
Bilirubin Urine: NEGATIVE
Glucose, UA: 50 mg/dL — AB
Ketones, ur: NEGATIVE mg/dL
Leukocytes,Ua: NEGATIVE
Nitrite: NEGATIVE
Protein, ur: 30 mg/dL — AB
Specific Gravity, Urine: 1.012 (ref 1.005–1.030)
pH: 6 (ref 5.0–8.0)

## 2022-12-11 LAB — OSMOLALITY, URINE: Osmolality, Ur: 392 mOsm/kg (ref 300–900)

## 2022-12-11 LAB — CBC
HCT: 32.7 % — ABNORMAL LOW (ref 39.0–52.0)
Hemoglobin: 10.8 g/dL — ABNORMAL LOW (ref 13.0–17.0)
MCH: 29.7 pg (ref 26.0–34.0)
MCHC: 33 g/dL (ref 30.0–36.0)
MCV: 89.8 fL (ref 80.0–100.0)
Platelets: 529 10*3/uL — ABNORMAL HIGH (ref 150–400)
RBC: 3.64 MIL/uL — ABNORMAL LOW (ref 4.22–5.81)
RDW: 13.2 % (ref 11.5–15.5)
WBC: 9.5 10*3/uL (ref 4.0–10.5)
nRBC: 0 % (ref 0.0–0.2)

## 2022-12-11 LAB — PROTIME-INR
INR: 1.2 (ref 0.8–1.2)
Prothrombin Time: 15.3 seconds — ABNORMAL HIGH (ref 11.4–15.2)

## 2022-12-11 LAB — ABO/RH: ABO/RH(D): B POS

## 2022-12-11 LAB — GLUCOSE, CAPILLARY
Glucose-Capillary: 263 mg/dL — ABNORMAL HIGH (ref 70–99)
Glucose-Capillary: 207 mg/dL — ABNORMAL HIGH (ref 70–99)
Glucose-Capillary: 138 mg/dL — ABNORMAL HIGH (ref 70–99)
Glucose-Capillary: 142 mg/dL — ABNORMAL HIGH (ref 70–99)

## 2022-12-11 LAB — BRAIN NATRIURETIC PEPTIDE: B Natriuretic Peptide: 227.8 pg/mL — ABNORMAL HIGH (ref 0.0–100.0)

## 2022-12-11 LAB — MAGNESIUM: Magnesium: 1.8 mg/dL (ref 1.7–2.4)

## 2022-12-11 LAB — TYPE AND SCREEN
ABO/RH(D): B POS
Antibody Screen: NEGATIVE

## 2022-12-11 LAB — BASIC METABOLIC PANEL
Anion gap: 9 (ref 5–15)
BUN: 10 mg/dL (ref 8–23)
CO2: 26 mmol/L (ref 22–32)
Calcium: 8.4 mg/dL — ABNORMAL LOW (ref 8.9–10.3)
Chloride: 95 mmol/L — ABNORMAL LOW (ref 98–111)
Creatinine, Ser: 0.9 mg/dL (ref 0.61–1.24)
GFR, Estimated: >60 mL/min (ref >60)
Glucose, Bld: 190 mg/dL — ABNORMAL HIGH (ref 70–99)
Potassium: 4.6 mmol/L (ref 3.5–5.1)
Sodium: 130 mmol/L — ABNORMAL LOW (ref 135–145)

## 2022-12-11 LAB — SODIUM, URINE, RANDOM: Sodium, Ur: 50 mmol/L

## 2022-12-11 LAB — PROCALCITONIN: Procalcitonin: 0.21 ng/mL

## 2022-12-11 LAB — URIC ACID: Uric Acid, Serum: 3.3 mg/dL — ABNORMAL LOW (ref 3.7–8.6)

## 2022-12-11 LAB — OSMOLALITY: Osmolality: 280 mOsm/kg (ref 275–295)

## 2022-12-11 LAB — C-REACTIVE PROTEIN: CRP: 17.9 mg/dL — ABNORMAL HIGH (ref <1.0)

## 2022-12-11 MED ORDER — INSULIN ASPART 100 UNIT/ML IJ SOLN
3.0000 [IU] | Freq: Three times a day (TID) | INTRAMUSCULAR | Status: DC
  Administered 2022-12-11 – 2022-12-13 (×7): 3 [IU] via SUBCUTANEOUS

## 2022-12-11 MED ORDER — LACTATED RINGERS IV SOLN: INTRAVENOUS | Status: AC

## 2022-12-11 MED ORDER — MUPIROCIN 2 % EX OINT: TOPICAL_OINTMENT | Freq: Two times a day (BID) | CUTANEOUS | Status: DC

## 2022-12-11 MED ORDER — SODIUM CHLORIDE 0.9 % IV SOLN
1.0000 g | INTRAVENOUS | Status: DC
  Administered 2022-12-11 – 2022-12-12 (×2): 1 g via INTRAVENOUS
  Filled 2022-12-11 (×2): qty 10

## 2022-12-11 MED ORDER — LINEZOLID 600 MG PO TABS
600.0000 mg | ORAL_TABLET | Freq: Two times a day (BID) | ORAL | Status: DC
  Administered 2022-12-11 – 2022-12-13 (×5): 600 mg via ORAL
  Filled 2022-12-11 (×6): qty 1

## 2022-12-11 MED ORDER — MUPIROCIN 2 % EX OINT
TOPICAL_OINTMENT | Freq: Two times a day (BID) | CUTANEOUS | Status: DC
  Filled 2022-12-11: qty 22

## 2022-12-11 NOTE — Progress Notes (Signed)
Patient presents for  therapeutic and diagnostic left sided thoracentesis. [place] limited chest shows trace amount of pleural fluid noted  Insufficient to perform a safe thoracentesis. Procedure not performed.

## 2022-12-11 NOTE — Progress Notes (Signed)
PROGRESS NOTE        PATIENT DETAILS Name: Travis Beck Age: 77 y.o. Sex: male Date of Birth: 01-27-1946 Admit Date: 12/07/2022 Admitting Physician Dewayne Shorter Levora Dredge, MD ZOX:[id number], Beatrix Fetters, MD  Brief Summary: Patient is a 77 y.o.  male with history of COPD, DM-2, HTN-who was recently hospitalized from 5/23 to 5/26 for septic shock secondary to multilobar pneumonia-requiring vasopressor on admission-stabilized and subsequently discharged home on Augmentin/doxycycline-presented back to the hospital with fever of 103 F and ongoing left-sided pleuritic chest pain.  It appears that his sputum cultures resulted post discharge-and were positive for MRSA.  Significant events: 5/23-5/26>> hospitalization for septic shock from multilobar PNA. 5/30>> fever 103 F at home-ongoing left-sided pleuritic chest pain-admit to [facility].  Significant studies: 5/23>> CTA chest: No PE, consolidation of most left lower lobe. 5/23>> CT renal stone study: Bilateral lower lobe consolidation left> right, no renal stone/hydronephrosis. 5/30>> CXR: Persistent consolidation left lower lobe-improved aeration of the lung bases.  Significant microbiology data: 5/24>> sputum culture: MRSA 5/30>> COVID PCR: Negative  Procedures: CT chest.  Possible left-sided empyema. Ultrasound-guided thoracentesis/chest tube placement requested by IR on 12/11/2022  Consults: PCCM, IR   Subjective:   Patient in bed, denies any headache, still having pleuritic left-sided chest discomfort, no shortness of breath, no focal weakness.  Objective: Vitals: Blood pressure (!) 140/70, pulse 75, temperature 99 F (37.2 C), temperature source Oral, resp. rate 16, height 5\' 5"  (1.651 m), weight 64.2 kg, SpO2 90 %.   Exam:  Awake Alert, No new F.N deficits, Normal affect [place].AT,PERRAL Supple Neck, No JVD,   Symmetrical Chest wall movement, Good air movement bilaterally, CTAB RRR,No Gallops, Rubs or new  Murmurs,  +ve B.Sounds, Abd Soft, No tenderness,   No Cyanosis, Clubbing or edema   Assessment/Plan:  MRSA PNA (sputum culture with MRSA on 5/24) Suspect ongoing symptoms due to incompletely treated PNA-as sputum cultures finalized postdischarge.  Although patient was on doxycycline-do not think he got enough days of MRSA coverage. He is improving-afebrile since initial presentation-no leukocytosis-less pleuritic chest pain.  Plan is to continue Zyvox.  He is still not feeling well, having a lot of left-sided pleuritic chest pain, encouraged to sit up in chair use I-S and flutter valve which have been added for pulmonary toiletry.    Continues to have left-sided pleuritic chest pain CT scan confirms parapneumonic effusion versus empyema, seen by pulmonary who requested CT-guided drainage by IR, consult requested on 12/11/2022, appropriate studies added.  Will monitor.   Hypokalemia Continue to bleed/recheck Check magnesium with a.m. labs.  COPD Not in exacerbation Bronchodilators  HTN Stable with Norvasc Resume ARB when able-depending on BP trend  HLD Statin  Anxiety disorder Xanax as needed-and dose reduced to half of home dosing-given risk for polypharmacy (also on Neurontin/Subutex)  Chronic pain syndrome Mostly back pain Mild pleuritic pain on the left side where he continues to have persistent infiltrates Continue Neurontin/Subutex  Debility/deconditioning PT/OT eval  DM-2 (A1c 6.8 on 5/24) CBG stable with SSI Resume oral hypoglycemics on discharge.  Recent Labs    12/09/22 1633 12/09/22 2037 12/10/22 0753  GLUCAP 210* 172* 178*       BMI: Estimated body mass index is 23.55 kg/m as calculated from the following:   Height as of this encounter: 5\' 5"  (1.651 m).   Weight as of this encounter: 64.2  kg.   Code status:   Code Status: Full Code   DVT Prophylaxis: enoxaparin (LOVENOX) injection 40 mg Start: 12/07/22 2200   Family Communication: None at  bedside   Disposition Plan: Status is: Observation The patient will require care spanning > 2 midnights and should be moved to inpatient because: Severity of illness   Planned Discharge Destination: Home with Home health in the next 1-2 days.   Diet: Diet Order             Diet Carb Modified Fluid consistency: Thin; Room service appropriate? Yes  Diet effective now                    MEDICATIONS: Scheduled Meds:  albuterol  2.5 mg Nebulization BID   amLODipine  5 mg Oral QHS   arformoterol  15 mcg Nebulization BID   aspirin EC  81 mg Oral Daily   budesonide (PULMICORT) nebulizer solution  0.25 mg Nebulization BID   buprenorphine  4 mg Sublingual QHS   buprenorphine  8 mg Sublingual Daily   enoxaparin (LOVENOX) injection  40 mg Subcutaneous Q24H   guaiFENesin  600 mg Oral BID   insulin aspart  0-5 Units Subcutaneous QHS   insulin aspart  0-9 Units Subcutaneous TID WC   pantoprazole  40 mg Oral Daily   polyethylene glycol  17 g Oral Daily   pramipexole  0.5 mg Oral QHS   pravastatin  20 mg Oral QPM   revefenacin  175 mcg Nebulization Daily   senna-docusate  1 tablet Oral QHS   Continuous Infusions:  linezolid (ZYVOX) IV 600 mg (12/09/22 2118)   PRN Meds:.acetaminophen **OR** acetaminophen, ALPRAZolam, gabapentin, hydrALAZINE, ipratropium-albuterol, traZODone   I have personally reviewed following labs and imaging studies  LABORATORY DATA:  Recent Labs  Lab 12/07/22 1315 12/08/22 0449 12/09/22 0215 12/10/22 0326  WBC 12.6* 9.4 9.4 9.7  HGB 10.9* 10.0* 9.8* 10.1*  HCT 33.4* 29.5* 29.0* 30.9*  PLT 338 356 333 416*  MCV 90.8 88.3 89.8 88.5  MCH 29.6 29.9 30.3 28.9  MCHC 32.6 33.9 33.8 32.7  RDW 13.2 13.3 13.2 13.3  LYMPHSABS 1.1  --   --   --   MONOABS 0.8  --   --   --   EOSABS 0.2  --   --   --   BASOSABS 0.1  --   --   --     Recent Labs  Lab 12/07/22 1315 12/08/22 0449 12/09/22 0215 12/09/22 1531 12/10/22 0326  NA 131* 134* 130*  --  132*   K 3.3* 2.9* 3.3*  --  4.6  CL 91* 96* 94*  --  96*  CO2 27 26 26   --  28  ANIONGAP 13 12 10   --  8  GLUCOSE 170* 97 198*  --  187*  BUN 14 11 7*  --  8  CREATININE 0.82 0.76 0.78  --  0.69  CRP  --   --   --  20.0* 17.5*  PROCALCITON 0.83 1.00  --  0.35 0.32  BNP 99.0  --   --   --  241.4*  MG  --   --   --   --  1.9  CALCIUM 8.2* 7.6* 7.7*  --  7.9*    No results found for: "CHOL", "HDL", "LDLCALC", "LDLDIRECT", "TRIG", "CHOLHDL"    Recent Labs  Lab 12/07/22 1315 12/08/22 0449 12/09/22 0215 12/09/22 1531 12/10/22 0326  CRP  --   --   --  20.0* 17.5*  PROCALCITON 0.83 1.00  --  0.35 0.32  BNP 99.0  --   --   --  241.4*  MG  --   --   --   --  1.9  CALCIUM 8.2* 7.6* 7.7*  --  7.9*    RADIOLOGY STUDIES/RESULTS: DG Chest Port 1 View  Result Date: 12/10/2022 CLINICAL DATA:  [id number] with shortness of breath. EXAM: PORTABLE CHEST 1 VIEW COMPARISON:  AP Lat 12/07/2022 FINDINGS: There is mild cardiomegaly without overt CHF. There is persistent left lower lobe consolidation. Small focal airspace disease peripherally in the left upper lobe. Aeration in the left lung is not notably changed. There is increased patchy consolidation in the right lower lung field. Right upper lung field is clear. There are small pleural effusions which appear similar. The mediastinum is normally outlined with patchy aortic calcifications. There is thoracic spondylosis.  Multiple overlying telemetry leads. IMPRESSION: 1. Increased patchy consolidation in the right lower lung field. 2. No significant change in left lower lobe consolidation and small focal peripheral left upper lobe opacity. 3. Stable small pleural effusions. 4. Mild cardiomegaly without overt CHF. 5. Aortic atherosclerosis. Electronically Signed   By: Almira Bar M.D.   On: 12/10/2022 07:41     LOS: 2 days   Signature  -    Susa Raring M.D on 12/10/2022 at 8:25 AM   -  To page go to [email or web address]

## 2022-12-11 NOTE — Care Management Important Message (Signed)
Important Message  Patient Details  Name: Rorry Tawes MRN: [id number] Date of Birth: Jun 21, 1946   Medicare Important Message Given:  Yes     Dorena Bodo 12/11/2022, 2:13 PM

## 2022-12-11 NOTE — Progress Notes (Signed)
Physical Therapy Treatment Patient Details Name: Travis Beck MRN: [id number] DOB: [birth year] Today's Date: 12/11/2022   History of Present Illness Pt is 77 year old presented to [facility] on  12/07/22 for dyspnea and fever. Pt recently hospitalized for respiratory failure and septic shock secondary to PNA. Pt with incomplete resolution of MRSA PNA.  PMH - copd, dm, htn, chronic back pain, anxiety    PT Comments    Patient is received in bed, he is agreeable to PT session. Patient is independent with bed mobility and transfers. Ambulated with min guard/supervision. Slight unsteadiness with gait. No lob. He will continue to benefit from ambulation while here to improve balance and strength.     Recommendations for follow up therapy are one component of a multi-disciplinary discharge planning process, led by the attending physician.  Recommendations may be updated based on patient status, additional functional criteria and insurance authorization.  Follow Up Recommendations       Assistance Recommended at Discharge PRN  Patient can return home with the following Assist for transportation;Assistance with cooking/housework   Equipment Recommendations  None recommended by PT    Recommendations for Other Services       Precautions / Restrictions Precautions Precaution Comments: mod fall Restrictions Weight Bearing Restrictions: No     Mobility  Bed Mobility Overal bed mobility: Independent                  Transfers Overall transfer level: Independent                      Ambulation/Gait Ambulation/Gait assistance: Supervision Gait Distance (Feet): 300 Feet Assistive device: None Gait Pattern/deviations: Step-through pattern, Decreased stride length, Drifts right/left Gait velocity: WFL     General Gait Details: slightly unsteady, no overt LOB. No AD used.   Stairs             Wheelchair Mobility    Modified Rankin (Stroke Patients Only)        Balance Overall balance assessment: Mild deficits observed, not formally tested Sitting-balance support: Feet supported Sitting balance-Leahy Scale: Normal       Standing balance-Leahy Scale: Good                              Cognition Arousal/Alertness: Awake/alert Behavior During Therapy: WFL for tasks assessed/performed Overall Cognitive Status: Within Functional Limits for tasks assessed                                          Exercises      General Comments        Pertinent Vitals/Pain Pain Assessment Pain Assessment: Faces Faces Pain Scale: Hurts a little bit Pain Location: Lt pleural pain Pain Descriptors / Indicators: Sore Pain Intervention(s): Monitored during session    Home Living                          Prior Function            PT Goals (current goals can now be found in the care plan section) Acute Rehab PT Goals Patient Stated Goal: get his strength back PT Goal Formulation: With patient Time For Goal Achievement: 12/15/22 Potential to Achieve Goals: Good Progress towards PT goals: Progressing toward goals    Frequency  Min 3X/week      PT Plan      Co-evaluation              AM-PAC PT "6 Clicks" Mobility   Outcome Measure  Help needed turning from your back to your side while in a flat bed without using bedrails?: None Help needed moving from lying on your back to sitting on the side of a flat bed without using bedrails?: None Help needed moving to and from a bed to a chair (including a wheelchair)?: None Help needed standing up from a chair using your arms (e.g., wheelchair or bedside chair)?: None Help needed to walk in hospital room?: A Little Help needed climbing 3-5 steps with a railing? : A Little 6 Click Score: 22    End of Session   Activity Tolerance: Patient tolerated treatment well Patient left: in bed;with call bell/phone within reach Nurse Communication:  Mobility status PT Visit Diagnosis: Unsteadiness on feet (R26.81);Muscle weakness (generalized) (M62.81)     Time: 1610-9604 PT Time Calculation (min) (ACUTE ONLY): 17 min  Charges:  $Gait Training: 8-22 mins                     Sharelle Burditt, PT, GCS 12/11/22,1:24 PM

## 2022-12-11 NOTE — TOC Progression Note (Signed)
Transition of Care [facility]) - Progression Note    Patient Details  Name: Travis Beck MRN: [id number] Date of Birth: [birth year]  Transition of Care [facility]) CM/SW Contact  Gordy Clement, RN Phone Number: 12/11/2022, 9:25 AM  Clinical Narrative:   a\  TOC continuing to follow for DC needs. PT and SLP have no recommendations for follow up after DC. No OT orders . Patient will DC to home           Expected Discharge Plan and Services                                               Social Determinants of Health (SDOH) Interventions SDOH Screenings   Food Insecurity: No Food Insecurity (12/08/2022)  Housing: Patient Declined (12/08/2022)  Transportation Needs: No Transportation Needs (12/08/2022)  Utilities: Not At Risk (12/08/2022)  Financial Resource Strain: Low Risk  (09/27/2022)  Physical Activity: Inactive (07/31/2022)  Tobacco Use: Medium Risk (12/07/2022)    Readmission Risk Interventions     No data to display

## 2022-12-11 NOTE — Care Management Important Message (Signed)
Important Message  Patient Details  Name: Annette Yockel MRN: [id number] Date of Birth: 11-24-1945   Medicare Important Message Given:  Yes  Patient left prior to IM delivery will mail a copy to the patient home address.    Mackey Varricchio 12/11/2022, 2:17 PM

## 2022-12-12 ENCOUNTER — Inpatient Hospital Stay (HOSPITAL_COMMUNITY): Payer: Medicare Other

## 2022-12-12 DIAGNOSIS — J189 Pneumonia, unspecified organism: Secondary | ICD-10-CM | POA: Diagnosis not present

## 2022-12-12 LAB — CBC WITH DIFFERENTIAL/PLATELET
Abs Immature Granulocytes: 0.03 10*3/uL (ref 0.00–0.07)
Basophils Absolute: 0.1 10*3/uL (ref 0.0–0.1)
Basophils Relative: 1 %
Eosinophils Absolute: 0.3 10*3/uL (ref 0.0–0.5)
Eosinophils Relative: 3 %
HCT: 30.7 % — ABNORMAL LOW (ref 39.0–52.0)
Hemoglobin: 10 g/dL — ABNORMAL LOW (ref 13.0–17.0)
Immature Granulocytes: 0 %
Lymphocytes Relative: 13 %
Lymphs Abs: 1 10*3/uL (ref 0.7–4.0)
MCH: 29 pg (ref 26.0–34.0)
MCHC: 32.6 g/dL (ref 30.0–36.0)
MCV: 89 fL (ref 80.0–100.0)
Monocytes Absolute: 0.7 10*3/uL (ref 0.1–1.0)
Monocytes Relative: 9 %
Neutro Abs: 5.8 10*3/uL (ref 1.7–7.7)
Neutrophils Relative %: 74 %
Platelets: 531 10*3/uL — ABNORMAL HIGH (ref 150–400)
RBC: 3.45 MIL/uL — ABNORMAL LOW (ref 4.22–5.81)
RDW: 12.9 % (ref 11.5–15.5)
WBC: 7.9 10*3/uL (ref 4.0–10.5)
nRBC: 0 % (ref 0.0–0.2)

## 2022-12-12 LAB — BASIC METABOLIC PANEL
Anion gap: 10 (ref 5–15)
BUN: 12 mg/dL (ref 8–23)
CO2: 26 mmol/L (ref 22–32)
Calcium: 8.4 mg/dL — ABNORMAL LOW (ref 8.9–10.3)
Chloride: 95 mmol/L — ABNORMAL LOW (ref 98–111)
Creatinine, Ser: 0.94 mg/dL (ref 0.61–1.24)
GFR, Estimated: >60 mL/min (ref >60)
Glucose, Bld: 171 mg/dL — ABNORMAL HIGH (ref 70–99)
Potassium: 4.4 mmol/L (ref 3.5–5.1)
Sodium: 131 mmol/L — ABNORMAL LOW (ref 135–145)

## 2022-12-12 LAB — GLUCOSE, CAPILLARY
Glucose-Capillary: 197 mg/dL — ABNORMAL HIGH (ref 70–99)
Glucose-Capillary: 176 mg/dL — ABNORMAL HIGH (ref 70–99)
Glucose-Capillary: 123 mg/dL — ABNORMAL HIGH (ref 70–99)
Glucose-Capillary: 125 mg/dL — ABNORMAL HIGH (ref 70–99)

## 2022-12-12 LAB — MAGNESIUM: Magnesium: 1.9 mg/dL (ref 1.7–2.4)

## 2022-12-12 LAB — BRAIN NATRIURETIC PEPTIDE: B Natriuretic Peptide: 250.8 pg/mL — ABNORMAL HIGH (ref 0.0–100.0)

## 2022-12-12 LAB — C-REACTIVE PROTEIN: CRP: 14.7 mg/dL — ABNORMAL HIGH (ref <1.0)

## 2022-12-12 LAB — PROCALCITONIN: Procalcitonin: 0.2 ng/mL

## 2022-12-12 MED ORDER — AMLODIPINE BESYLATE 10 MG PO TABS
10.0000 mg | ORAL_TABLET | Freq: Every day | ORAL | Status: DC
  Administered 2022-12-12: 10 mg via ORAL
  Filled 2022-12-12: qty 1

## 2022-12-12 MED ORDER — AMLODIPINE BESYLATE 10 MG PO TABS: 10.0000 mg | ORAL_TABLET | Freq: Every day | ORAL | Status: DC

## 2022-12-12 MED ORDER — FUROSEMIDE 20 MG PO TABS
20.0000 mg | ORAL_TABLET | Freq: Once | ORAL | Status: AC
  Administered 2022-12-12: 20 mg via ORAL
  Filled 2022-12-12: qty 1

## 2022-12-12 NOTE — Progress Notes (Signed)
PROGRESS NOTE        PATIENT DETAILS Name: Travis Beck Age: 77 y.o. Sex: male Date of Birth: [birth year] Admit Date: 12/07/2022 Admitting Physician Dewayne Shorter Levora Dredge, MD [id number], Beatrix Fetters, MD  Brief Summary: Patient is a 77 y.o.  male with history of COPD, DM-2, HTN-who was recently hospitalized from 5/23 to 5/26 for septic shock secondary to multilobar pneumonia-requiring vasopressor on admission-stabilized and subsequently discharged home on Augmentin/doxycycline-presented back to the hospital with fever of 103 F and ongoing left-sided pleuritic chest pain.  It appears that his sputum cultures resulted post discharge-and were positive for MRSA.  Significant events: 5/23-5/26>> hospitalization for septic shock from multilobar PNA. 5/30>> fever 103 F at home-ongoing left-sided pleuritic chest pain-admit to [facility].  Significant studies: 5/23>> CTA chest: No PE, consolidation of most left lower lobe. 5/23>> CT renal stone study: Bilateral lower lobe consolidation left> right, no renal stone/hydronephrosis. 5/30>> CXR: Persistent consolidation left lower lobe-improved aeration of the lung bases.  Significant microbiology data: 5/24>> sputum culture: MRSA 5/30>> COVID PCR: Negative  Procedures: CT chest.  Possible left-sided empyema. Ultrasound-guided thoracentesis/chest tube placement requested by IR on 12/11/2022  Consults: PCCM, IR   Subjective:   Patient in bed, appears comfortable, denies any headache, no fever, no chest pain or pressure, no shortness of breath , no abdominal pain. No new focal weakness.  Objective: Vitals: Blood pressure (!) 147/76, pulse 76, temperature 97.9 F (36.6 C), temperature source Oral, resp. rate 20, height 5\' 5"  (1.651 m), weight 64.2 kg, SpO2 96 %.   Exam:  Awake Alert, No new F.N deficits, Normal affect [place].AT,PERRAL Supple Neck, No JVD,   Symmetrical Chest wall movement, Good air movement bilaterally,  CTAB RRR,No Gallops, Rubs or new Murmurs,  +ve B.Sounds, Abd Soft, No tenderness,   No Cyanosis, Clubbing or edema   Assessment/Plan:  MRSA PNA (sputum culture with MRSA on 5/24) Suspect ongoing symptoms due to incompletely treated PNA-as sputum cultures finalized postdischarge.  Although patient was on doxycycline-do not think he got enough days of MRSA coverage. He is improving-afebrile since initial presentation-no leukocytosis-less pleuritic chest pain.  Plan is to continue Zyvox.   Since he continues to have pleuritic chest pain CT chest was obtained showing a small parapneumonic effusion versus empyema on his left lung, pulmonary and IR were consulted.  Fluid was minimal and not enough to be tapped, for now plan is 2 more weeks of oral Zyvox thereafter follow-up with his primary lung physician in a week, they have already set up an appointment with Dr. Isaiah Serge.  Evaluate for home oxygen and arrange, if remains stable likely discharge home on 12/13/2022.  Hypomagnesemia Mild SIADH gentle Lasix on 12/12/2022  COPD Not in exacerbation Bronchodilators  HTN Occasions adjusted for better control on 12/12/2022  HLD Statin  Anxiety disorder Xanax as needed-and dose reduced to half of home dosing-given risk for polypharmacy (also on Neurontin/Subutex)  Chronic pain syndrome Mostly back pain Mild pleuritic pain on the left side where he continues to have persistent infiltrates Continue Neurontin/Subutex  Debility/deconditioning PT/OT eval  DM-2 (A1c 6.8 on 5/24) CBG stable with SSI Resume oral hypoglycemics on discharge.  Recent Labs    12/11/22 1639 12/11/22 2009 12/12/22 0800  GLUCAP 138* 142* 197*       BMI: Estimated body mass index is 23.55 kg/m as calculated from the following:  Height as of this encounter: 5\' 5"  (1.651 m).   Weight as of this encounter: 64.2 kg.   Code status:   Code Status: Full Code   DVT Prophylaxis: enoxaparin (LOVENOX) injection 40 mg  Start: 12/07/22 2200   Family Communication:   sister Hilda Lias [phone] - 12/11/22, 12/12/22   Disposition Plan: Status is: Observation The patient will require care spanning > 2 midnights and should be moved to inpatient because: Severity of illness   Planned Discharge Destination: Home with Home health in the next 1-2 days.   Diet: Diet Order             Diet Carb Modified Fluid consistency: Thin; Room service appropriate? Yes  Diet effective now                    MEDICATIONS: Scheduled Meds:  amLODipine  10 mg Oral QHS   arformoterol  15 mcg Nebulization BID   aspirin EC  81 mg Oral Daily   budesonide (PULMICORT) nebulizer solution  0.25 mg Nebulization BID   buprenorphine  4 mg Sublingual QHS   buprenorphine  8 mg Sublingual Daily   enoxaparin (LOVENOX) injection  40 mg Subcutaneous Q24H   furosemide  20 mg Oral Once   guaiFENesin  600 mg Oral BID   insulin aspart  0-5 Units Subcutaneous QHS   insulin aspart  0-9 Units Subcutaneous TID WC   insulin aspart  3 Units Subcutaneous TID WC   linezolid  600 mg Oral Q12H   mupirocin ointment   Nasal BID   pantoprazole  40 mg Oral Daily   polyethylene glycol  17 g Oral Daily   pramipexole  0.5 mg Oral QHS   pravastatin  20 mg Oral QPM   revefenacin  175 mcg Nebulization Daily   senna-docusate  1 tablet Oral QHS   Continuous Infusions:  cefTRIAXone (ROCEPHIN)  IV Stopped (12/11/22 1406)   PRN Meds:.acetaminophen **OR** acetaminophen, ALPRAZolam, gabapentin, hydrALAZINE, ipratropium-albuterol, sodium chloride, traZODone   I have personally reviewed following labs and imaging studies  LABORATORY DATA:  Recent Labs  Lab 12/07/22 1315 12/08/22 0449 12/09/22 0215 12/10/22 0326 12/11/22 0554 12/12/22 0411  WBC 12.6* 9.4 9.4 9.7 9.5 7.9  HGB 10.9* 10.0* 9.8* 10.1* 10.8* 10.0*  HCT 33.4* 29.5* 29.0* 30.9* 32.7* 30.7*  PLT 338 356 333 416* 529* 531*  MCV 90.8 88.3 89.8 88.5 89.8 89.0  MCH 29.6 29.9 30.3 28.9  29.7 29.0  MCHC 32.6 33.9 33.8 32.7 33.0 32.6  RDW 13.2 13.3 13.2 13.3 13.2 12.9  LYMPHSABS 1.1  --   --   --   --  1.0  MONOABS 0.8  --   --   --   --  0.7  EOSABS 0.2  --   --   --   --  0.3  BASOSABS 0.1  --   --   --   --  0.1    Recent Labs  Lab 12/07/22 1315 12/08/22 0449 12/09/22 0215 12/09/22 1531 12/10/22 0326 12/11/22 0554 12/12/22 0411  NA 131* 134* 130*  --  132* 130* 131*  K 3.3* 2.9* 3.3*  --  4.6 4.6 4.4  CL 91* 96* 94*  --  96* 95* 95*  CO2 27 26 26   --  28 26 26   ANIONGAP 13 12 10   --  8 9 10   GLUCOSE 170* 97 198*  --  187* 190* 171*  BUN 14 11 7*  --  8 10 12  CREATININE 0.82 0.76 0.78  --  0.69 0.90 0.94  CRP  --   --   --  20.0* 17.5* 17.9* 14.7*  PROCALCITON 0.83 1.00  --  0.35 0.32 0.21 0.20  INR  --   --   --   --   --  1.2  --   BNP 99.0  --   --   --  241.4* 227.8* 250.8*  MG  --   --   --   --  1.9 1.8 1.9  CALCIUM 8.2* 7.6* 7.7*  --  7.9* 8.4* 8.4*    No results found for: "CHOL", "HDL", "LDLCALC", "LDLDIRECT", "TRIG", "CHOLHDL"    Recent Labs  Lab 12/07/22 1315 12/08/22 0449 12/09/22 0215 12/09/22 1531 12/10/22 0326 12/11/22 0554 12/12/22 0411  CRP  --   --   --  20.0* 17.5* 17.9* 14.7*  PROCALCITON 0.83 1.00  --  0.35 0.32 0.21 0.20  INR  --   --   --   --   --  1.2  --   BNP 99.0  --   --   --  241.4* 227.8* 250.8*  MG  --   --   --   --  1.9 1.8 1.9  CALCIUM 8.2* 7.6* 7.7*  --  7.9* 8.4* 8.4*    RADIOLOGY STUDIES/RESULTS: IR US CHEST  Result Date: 12/11/2022 CLINICAL DATA:  Patient admitted with pneumonia with concern for loculated pleural effusion. Please perform chest ultrasound and ultrasound-guided thoracentesis as indicated. EXAM: CHEST ULTRASOUND COMPARISON:  Chest CT-12/09/2021 FINDINGS: Sonographic evaluation of the chest, performed by the dictating interventional radiologist, demonstrates no significant fluid within either the right or left chest. As such, no thoracentesis was attempted. Dense consolidation is noted  within the bilateral lung bases, left-greater-than-right, similar to preceding chest CT. IMPRESSION: No significant pleural fluid is identified bilaterally. No thoracentesis attempted. Electronically Signed   By: Simonne Come M.D.   On: 12/11/2022 12:36   CT CHEST WO CONTRAST  Result Date: 12/10/2022 CLINICAL DATA:  Pneumonia suspected EXAM: CT CHEST WITHOUT CONTRAST TECHNIQUE: Multidetector CT imaging of the chest was performed following the standard protocol without IV contrast. RADIATION DOSE REDUCTION: This exam was performed according to the departmental dose-optimization program which includes automated exposure control, adjustment of the mA and/or kV according to patient size and/or use of iterative reconstruction technique. COMPARISON:  CTA chest dated Nov 30, 2022 FINDINGS: Cardiovascular: Normal heart size. No pericardial effusion. Normal caliber thoracic aorta with severe calcified plaque. Severe left main and three-vessel coronary artery calcifications. Mediastinum/Nodes: Small hiatal hernia. Thyroid is unremarkable. Stable mildly enlarged mediastinal lymph nodes. Reference subcarinal lymph node measuring 1.5 cm in short axis on series 3, image 33. Lungs/Pleura: Central airways are patent. Left-greater-than-right lower lobe consolidations, decreased when compared with the prior exam. Numerous scattered small solid pulmonary nodules, similar to prior exam. New left-greater-than-right small pleural effusions. Left-sided effusion has areas of loculation. Upper Abdomen: Simple appearing cyst of the left kidney, no specific follow-up imaging is recommended. Musculoskeletal: No chest wall mass or suspicious bone lesions identified. IMPRESSION: 1. New left-greater-than-right small pleural effusions. Left-sided effusion has areas of loculation, concerning for empyema. 2. Bilateral lower lobe consolidations are decreased when compared with the prior exam, consistent with resolving pneumonia. 3. Numerous  scattered small solid pulmonary nodules, similar to prior exam likely infectious. Recommend follow-up chest CT in 3 months to ensure resolution. 4. Stable mildly enlarged mediastinal lymph nodes. 5. Aortic Atherosclerosis (ICD10-I70.0). Electronically Signed   By:  Allegra Lai M.D.   On: 12/10/2022 12:58     LOS: 4 days   Signature  -    Susa Raring M.D on 12/12/2022 at 8:38 AM   -  To page go to [email or web address]

## 2022-12-12 NOTE — Progress Notes (Signed)
Mobility Specialist Progress Note   12/12/22 1654  Mobility  Activity Ambulated with assistance in hallway  Level of Assistance Contact guard assist, steadying assist  Assistive Device Front wheel walker  Distance Ambulated (ft) 240 ft  Activity Response Tolerated well  Mobility Referral Yes  $Mobility charge 1 Mobility  Mobility Specialist Start Time (ACUTE ONLY) 1604  Mobility Specialist Stop Time (ACUTE ONLY) 1636  Mobility Specialist Time Calculation (min) (ACUTE ONLY) 32 min   Pre Mobility: 69% SpO2 on RA During Mobility: 77% - 93% SpO2 on 2LO2 - 4LO2 Post Mobility: 91% - 93% SpO2 on 4LO2  Received pt in bed having c/o pain in RUQ but eager for mobility. Pt placed on RA while sitting EOB and desaturated to 69% SpO2. Pt required 4LO2 to maintain >90% SpO2 during hallway ambulation. No faults during ambulation, returned back to EOB and placed back on 4LO2. RN notified.    Frederico Hamman Mobility Specialist Please contact via SecureChat or  Rehab office at [phone]

## 2022-12-12 NOTE — Progress Notes (Signed)
   NAME:  Travis Beck, MRN:  [id number], DOB:  1945/12/09, LOS: 4 ADMISSION DATE:  12/07/2022, CONSULTATION DATE:  6/2  REFERRING MD: Thedore Mins CHIEF COMPLAINT:  Fever, left sided chest pain   History of Present Illness:  Pt is a 77 yr old male with significant pmhx of COPD, DM type 2, HTN, and with recent hospitalization from 5/23 to 5/26 for septic shock requiring pressors due to multilobar pna who returns back to the hospital for fever of 103 and with frequent left sided pleuritic chest pain on 5/30.  PCCM was consulted due to ongoing pleuritic pain and concerns of empyema on CT chest completed 12/10/22.   Pertinent  Medical History  COPD Asthma HTH Type 2 Diabetes   Significant Hospital Events: Including procedures, antibiotic start and stop dates in addition to other pertinent events   5/30 admitted with fever and left sided pleuritic pain  6/2 Consult for concern for left empyema   Interim History / Subjective:  Improved pain and breathing. Defervesced. Qualified for home oxygen.  Objective   Blood pressure (!) 147/76, pulse 76, temperature 97.9 F (36.6 C), temperature source Oral, resp. rate 20, height 5\' 5"  (1.651 m), weight 64.2 kg, SpO2 96 %.        Intake/Output Summary (Last 24 hours) at 12/12/2022 1259 Last data filed at 12/12/2022 0345 Gross per 24 hour  Intake 1926.26 ml  Output 2200 ml  Net -273.74 ml   Filed Weights   12/07/22 1247  Weight: 64.2 kg    Examination: Gen:     Elderly, well appearing no distress HEENT:  on nasal cannula Lungs:    diminished, bibasilar crackles CV:         RRR Abd:      Soft, nontender Ext:    No edema Skin:      Warm and dry; no rashes Neuro:   normal speech no focal asymmetry    Resolved Hospital Problem list     Assessment & Plan:  Parapneumonic effusion vs empyema secondary to MRSA Pneumonia  Trace left sided loculated effusion not amenable to chest tube placement  P: Continue Linezolid for MRSA coverage - agree  with additional 2 weeks abx.  Encourage mobilization and pulmonary hygiene  Reviewed flutter valve and IS technique with him On trelegy at home, but has had difficulty affording medications. Would see if we can get him set up for financial assistance as an outpatient for medications vs covering nebs through Part B. Can send Yupelri, pulmicort and brovana to direct RX vs DME company. We can handle this in clinic.  He has follow up set up in our office already.   Ok for discharge from our perspective.   Durel Salts, MD Pulmonary and Critical Care Medicine [facility] 12/12/2022 1:16 PM Pager: see AMION  If no response to pager, please call critical care on call (see AMION) until 7pm After 7:00 pm call Elink

## 2022-12-12 NOTE — Progress Notes (Signed)
SATURATION QUALIFICATIONS: (This note is used to comply with regulatory documentation for home oxygen)  Patient Saturations on Room Air at Rest = 91%  Patient Saturations on Room Air while Ambulating = 71%  Patient Saturations on 4 Liters of oxygen while Ambulating = 92%  Please briefly explain why patient needs home oxygen: Pt desatted quickly while ambulating on room air. Pt reported dizziness and lightheadedness and became unsteady on his feet.

## 2022-12-13 ENCOUNTER — Other Ambulatory Visit (HOSPITAL_COMMUNITY): Payer: Self-pay

## 2022-12-13 DIAGNOSIS — J189 Pneumonia, unspecified organism: Secondary | ICD-10-CM | POA: Diagnosis not present

## 2022-12-13 LAB — GLUCOSE, CAPILLARY
Glucose-Capillary: 175 mg/dL — ABNORMAL HIGH (ref 70–99)
Glucose-Capillary: 248 mg/dL — ABNORMAL HIGH (ref 70–99)

## 2022-12-13 LAB — MYCOPLASMA PNEUMONIAE ANTIBODY, IGM: Mycoplasma pneumo IgM: <770 U/mL (ref 0–769)

## 2022-12-13 MED ORDER — BREZTRI AEROSPHERE 160-9-4.8 MCG/ACT IN AERO
2.0000 | INHALATION_SPRAY | Freq: Two times a day (BID) | RESPIRATORY_TRACT | 0 refills | Status: AC
  Filled 2022-12-13: qty 10.7, 30d supply, fill #0
  Filled 2022-12-13: qty 60, 30d supply, fill #0

## 2022-12-13 MED ORDER — GUAIFENESIN ER 600 MG PO TB12
600.0000 mg | ORAL_TABLET | Freq: Two times a day (BID) | ORAL | 0 refills | Status: DC | PRN
  Filled 2022-12-13: qty 30, 15d supply, fill #0

## 2022-12-13 MED ORDER — LINEZOLID 600 MG PO TABS
600.0000 mg | ORAL_TABLET | Freq: Two times a day (BID) | ORAL | 0 refills | Status: DC
  Filled 2022-12-13: qty 28, 14d supply, fill #0

## 2022-12-13 NOTE — Progress Notes (Signed)
Physical Therapy Treatment Patient Details Name: Willett Ozbirn MRN: [id number] DOB: [birth year] Today's Date: 12/13/2022   History of Present Illness Pt is 77 year old presented to [facility] on  12/07/22 for dyspnea and fever. Pt recently hospitalized for respiratory failure and septic shock secondary to PNA. Pt with incomplete resolution of MRSA PNA.  PMH - copd, dm, htn, chronic back pain, anxiety    PT Comments    Pt tolerated today's session well, ambulating with RW and 3L O2 [place], SPO2 stable. Pt reports plan to discharge today, educated on easing back into activity upon discharge, reports his brother lives with him and he has all DME. Educated on rest breaks and use of pulse ox to monitor SPO2 once home, pt verbalizing understanding. Pt able to ambulate ~350 feet without a rest break and without reports of fatigue. Acute PT will continue to follow up with pt during admission to continue to progress endurance with mobility. Discharge recommendations remain appropriate.     Recommendations for follow up therapy are one component of a multi-disciplinary discharge planning process, led by the attending physician.  Recommendations may be updated based on patient status, additional functional criteria and insurance authorization.  Follow Up Recommendations       Assistance Recommended at Discharge PRN  Patient can return home with the following Assist for transportation;Assistance with cooking/housework   Equipment Recommendations  None recommended by PT    Recommendations for Other Services       Precautions / Restrictions Precautions Precautions: None Precaution Comments: mod fall Restrictions Weight Bearing Restrictions: No     Mobility  Bed Mobility Overal bed mobility: Modified Independent             General bed mobility comments: HOB elevated, no assist for supine<>sit    Transfers Overall transfer level: Modified independent Equipment used: Rolling walker (2  wheels) Transfers: Sit to/from Stand Sit to Stand: Modified independent (Device/Increase time)           General transfer comment: modI, requesting use of RW for line management    Ambulation/Gait Ambulation/Gait assistance: Supervision Gait Distance (Feet): 350 Feet Assistive device: Rolling walker (2 wheels) Gait Pattern/deviations: Step-through pattern, Decreased stride length, Drifts right/left Gait velocity: grossly WFL     General Gait Details: no overt LOB, no unsteadiness noted with RW but mild path deviation, supervision for line management   Stairs             Wheelchair Mobility    Modified Rankin (Stroke Patients Only)       Balance Overall balance assessment: Needs assistance Sitting-balance support: Feet supported, No upper extremity supported Sitting balance-Leahy Scale: Normal     Standing balance support: During functional activity, Bilateral upper extremity supported Standing balance-Leahy Scale: Fair Standing balance comment: use of RW during ambulation per pt request                            Cognition Arousal/Alertness: Awake/alert Behavior During Therapy: WFL for tasks assessed/performed Overall Cognitive Status: Within Functional Limits for tasks assessed                                 General Comments: pleasant throughout session        Exercises      General Comments General comments (skin integrity, edema, etc.): SPO2 89% or greater on 3L O2 [place] with ambulation  Pertinent Vitals/Pain Pain Assessment Pain Assessment: Faces    Home Living                          Prior Function            PT Goals (current goals can now be found in the care plan section) Acute Rehab PT Goals Patient Stated Goal: get his strength back PT Goal Formulation: With patient Time For Goal Achievement: 12/15/22 Potential to Achieve Goals: Good Progress towards PT goals: Progressing toward goals     Frequency    Min 3X/week      PT Plan Current plan remains appropriate    Co-evaluation              AM-PAC PT "6 Clicks" Mobility   Outcome Measure  Help needed turning from your back to your side while in a flat bed without using bedrails?: None Help needed moving from lying on your back to sitting on the side of a flat bed without using bedrails?: None Help needed moving to and from a bed to a chair (including a wheelchair)?: None Help needed standing up from a chair using your arms (e.g., wheelchair or bedside chair)?: None Help needed to walk in hospital room?: A Little Help needed climbing 3-5 steps with a railing? : A Little 6 Click Score: 22    End of Session Equipment Utilized During Treatment: Gait belt;Oxygen Activity Tolerance: Patient tolerated treatment well Patient left: in bed;with call bell/phone within reach Nurse Communication: Mobility status PT Visit Diagnosis: Unsteadiness on feet (R26.81);Muscle weakness (generalized) (M62.81)     Time: 4098-1191 PT Time Calculation (min) (ACUTE ONLY): 12 min  Charges:  $Gait Training: 8-22 mins                     Lindalou Hose, PT DPT Acute Rehabilitation Services Office [phone]    Leonie Man 12/13/2022, 3:27 PM

## 2022-12-13 NOTE — TOC Transition Note (Signed)
Transition of Care [facility]) - CM/SW Discharge Note   Patient Details  Name: Travis Beck MRN: [id number] Date of Birth: 12-12-1945  Transition of [facility] CM/SW Contact:  Gordy Clement, RN Phone Number: 12/13/2022, 9:19 AM   Clinical Narrative:    Patient to dc to home today  Will resume HH with [facility].  SN/PT- orders in. Home O2 will be needed. [employer] to deliver portable to bedside for transport then concentrator to home after DC. Friend to transport.   Patient may leave after O2 is delivered pending walk test   No additional TOC needs           Patient Goals and CMS Choice      Discharge Placement                         Discharge Plan and Services Additional resources added to the After Visit Summary for                                       Social Determinants of Health (SDOH) Interventions SDOH Screenings   Food Insecurity: No Food Insecurity (12/08/2022)  Housing: Patient Declined (12/08/2022)  Transportation Needs: No Transportation Needs (12/08/2022)  Utilities: Not At Risk (12/08/2022)  Financial Resource Strain: Low Risk  (09/27/2022)  Physical Activity: Inactive (07/31/2022)  Tobacco Use: Medium Risk (12/07/2022)     Readmission Risk Interventions     No data to display

## 2022-12-13 NOTE — Discharge Instructions (Signed)
Follow with Primary MD Kirstie Peri, MD in 7 days   Get CBC, CMP, 2 view Chest X ray -  checked next visit with your primary MD   Activity: As tolerated with Full fall precautions use walker/cane & assistance as needed  Disposition Home   Diet: Heart Healthy Low Carb, check CBGs q. [facility] S.  Special Instructions: If you have smoked or chewed Tobacco  in the last 2 yrs please stop smoking, stop any regular Alcohol  and or any Recreational drug use.  On your next visit with your primary care physician please Get Medicines reviewed and adjusted.  Please request your Prim.MD to go over all Hospital Tests and Procedure/Radiological results at the follow up, please get all Hospital records sent to your Prim MD by signing hospital release before you go home.  If you experience worsening of your admission symptoms, develop shortness of breath, life threatening emergency, suicidal or homicidal thoughts you must seek medical attention immediately by calling 911 or calling your MD immediately  if symptoms less severe.  You Must read complete instructions/literature along with all the possible adverse reactions/side effects for all the Medicines you take and that have been prescribed to you. Take any new Medicines after you have completely understood and accpet all the possible adverse reactions/side effects.

## 2022-12-13 NOTE — Discharge Summary (Signed)
Travis Beck ZOX:[id number] DOB: 1945-09-18 DOA: 12/07/2022  PCP: Kirstie Peri, MD  Admit date: 12/07/2022  Discharge date: 12/13/2022  Admitted From: Home   Disposition:  Home   Recommendations for Outpatient Follow-up:   Follow up with PCP in 1-2 weeks  PCP Please obtain BMP/CBC, 2 view CXR in 1week,  (see Discharge instructions)   PCP Please follow up on the following pending results:    Home Health: PT if qulifies   Equipment/Devices: Home o2 3 lits  Consultations: PCCM, IR Discharge Condition: Stable    CODE STATUS: Full    Diet Recommendation: Heart Healthy - Low Carb    Chief Complaint  Patient presents with   Pneumonia     Brief history of present illness from the day of admission and additional interim summary    77 y.o.  male with history of COPD, DM-2, HTN-who was recently hospitalized from 5/23 to 5/26 for septic shock secondary to multilobar pneumonia-requiring vasopressor on admission-stabilized and subsequently discharged home on Augmentin/doxycycline-presented back to the hospital with fever of 103 F and ongoing left-sided pleuritic chest pain.  It appears that his sputum cultures resulted post discharge-and were positive for MRSA.   Significant events: 5/23-5/26>> hospitalization for septic shock from multilobar PNA. 5/30>> fever 103 F at home-ongoing left-sided pleuritic chest pain-admit to [facility].   Significant studies: 5/23>> CTA chest: No PE, consolidation of most left lower lobe. 5/23>> CT renal stone study: Bilateral lower lobe consolidation left> right, no renal stone/hydronephrosis. 5/30>> CXR: Persistent consolidation left lower lobe-improved aeration of the lung bases.   Significant microbiology data: 5/24>> sputum culture: MRSA 5/30>> COVID PCR: Negative    Procedures: CT chest.  Possible left-sided empyema. Ultrasound-guided thoracentesis/chest tube placement requested by IR on 12/11/2022   Consults: PCCM, IR                                                                 Hospital Course   MRSA PNA (sputum culture with MRSA on 5/24) Suspect ongoing symptoms due to incompletely treated PNA-as sputum cultures finalized postdischarge.  Although patient was on doxycycline-do not think he got enough days of MRSA coverage. He is improving-afebrile since initial presentation-no leukocytosis-less pleuritic chest pain.  Plan is to continue Zyvox.    Since he continues to have pleuritic chest pain CT chest was obtained showing a small parapneumonic effusion versus empyema on his left lung, pulmonary and IR were consulted.  Fluid was minimal and not enough to be tapped, for now plan is 2 more weeks of oral Zyvox thereafter follow-up with his primary lung physician in a week, they have already set up an appointment with Dr. Isaiah Serge.  Evaluate for home oxygen and arrange, if remains stable likely discharge home on 12/13/2022.   Hypomagnesemia Mild SIADH gentle Lasix on  12/12/2022, improved   COPD Not in exacerbation Bronchodilators   HTN Able continue home medications   HLD Statin   Anxiety disorder Xanax as needed-and dose reduced to half of home dosing-given risk for polypharmacy (also on Neurontin/Subutex)   Chronic pain syndrome Mostly back pain Mild pleuritic pain on the left side where he continues to have persistent infiltrates Continue Neurontin/Subutex   Debility/deconditioning PT/OT eval done here, will get home PT if he qualifies for it.   DM-2 (A1c 6.8 on 5/24) continue home regimen,    Discharge diagnosis     Principal Problem:   Pneumonia    Discharge instructions    Discharge Instructions     Diet - low sodium heart healthy   Complete by: As directed    Discharge instructions   Complete by: As directed    Follow  with Primary MD Kirstie Peri, MD in 7 days   Get CBC, CMP, 2 view Chest X ray -  checked next visit with your primary MD   Activity: As tolerated with Full fall precautions use walker/cane & assistance as needed  Disposition Home   Diet: Heart Healthy Low Carb, check CBGs q. ACH S.  Special Instructions: If you have smoked or chewed Tobacco  in the last 2 yrs please stop smoking, stop any regular Alcohol  and or any Recreational drug use.  On your next visit with your primary care physician please Get Medicines reviewed and adjusted.  Please request your Prim.MD to go over all Hospital Tests and Procedure/Radiological results at the follow up, please get all Hospital records sent to your Prim MD by signing hospital release before you go home.  If you experience worsening of your admission symptoms, develop shortness of breath, life threatening emergency, suicidal or homicidal thoughts you must seek medical attention immediately by calling 911 or calling your MD immediately  if symptoms less severe.  You Must read complete instructions/literature along with all the possible adverse reactions/side effects for all the Medicines you take and that have been prescribed to you. Take any new Medicines after you have completely understood and accpet all the possible adverse reactions/side effects.   Increase activity slowly   Complete by: As directed        Discharge Medications   Allergies as of 12/13/2022       Reactions   Penicillins Rash   No problems with ampicillin during hospitalization        Medication List     TAKE these medications    acetaminophen 325 MG tablet Commonly known as: TYLENOL Take 2 tablets (650 mg total) by mouth every 6 (six) hours as needed for mild pain (or Fever >/= 101).   albuterol 108 (90 Base) MCG/ACT inhaler Commonly known as: VENTOLIN HFA Inhale 2 puffs into the lungs every 4 (four) hours as needed for wheezing or shortness of breath.    ALPRAZolam 1 MG tablet Commonly known as: XANAX Take 1 tablet (1 mg total) by mouth 3 (three) times daily as needed for anxiety.   amLODipine 5 MG tablet Commonly known as: NORVASC Take 5 mg by mouth at bedtime.   aspirin EC 81 MG tablet Take 1 tablet (81 mg total) by mouth daily. Swallow whole.   buprenorphine 8 MG Subl SL tablet Commonly known as: SUBUTEX Place 4-8 mg under the tongue See admin instructions. Take 8 mg by mouth in the morning and then take 4 mg by mouth at bedtime.   cetirizine 10 MG tablet Commonly  known as: ZYRTEC Take 10 mg by mouth as needed for allergies (itching).   fluticasone 50 MCG/ACT nasal spray Commonly known as: FLONASE Place 2 sprays into both nostrils daily.   gabapentin 400 MG capsule Commonly known as: NEURONTIN Take 1 capsule (400 mg total) by mouth every 8 (eight) hours as needed. What changed: reasons to take this   glipiZIDE 10 MG 24 hr tablet Commonly known as: GLUCOTROL XL Take 10 mg by mouth daily.   guaiFENesin 600 MG 12 hr tablet Commonly known as: MUCINEX Take 1 tablet (600 mg total) by mouth 2 (two) times daily as needed for cough.   linezolid 600 MG tablet Commonly known as: ZYVOX Take 1 tablet (600 mg total) by mouth every 12 (twelve) hours.   metFORMIN 1000 MG tablet Commonly known as: GLUCOPHAGE Take 1,000 mg by mouth 2 (two) times daily with a meal.   montelukast 10 MG tablet Commonly known as: SINGULAIR Take 1 tablet (10 mg total) by mouth at bedtime.   pantoprazole 40 MG tablet Commonly known as: PROTONIX Take 1 tablet (40 mg total) by mouth daily.   pramipexole 0.5 MG tablet Commonly known as: MIRAPEX Take 0.5 mg by mouth at bedtime.   pravastatin 20 MG tablet Commonly known as: PRAVACHOL Take 20 mg by mouth every evening.   Trelegy Ellipta 100-62.5-25 MCG/ACT Aepb Generic drug: Fluticasone-Umeclidin-Vilant Inhale 1 puff into the lungs daily.   valsartan 160 MG tablet Commonly known as:  DIOVAN Take 160 mg by mouth daily.               Durable Medical Equipment  (From admission, onward)           Start     Ordered   12/12/22 [phone]  For home use only DME oxygen  Once       Question Answer Comment  Length of Need 6 Months   Mode or (Route) Nasal cannula   Liters per Minute 3   Frequency Continuous (stationary and portable oxygen unit needed)   Oxygen conserving device Yes   Oxygen delivery system Gas      12/12/22 0837   12/12/22 0838  For home use only DME Walker rolling  Once       Comments: 5 wheel  Question Answer Comment  Walker: With 5 Inch Wheels   Patient needs a walker to treat with the following condition Weakness      12/12/22 0837             Follow-up Information     Kirstie Peri, MD. Schedule an appointment as soon as possible for a visit in 1 week(s).   Specialty: Internal Medicine Why: also with your Lung MD in 1 week Contact information: [address] [place] [address] [phone]                 Major procedures and Radiology Reports - PLEASE review detailed and final reports thoroughly  -      DG CHEST PORT 1 VIEW  Result Date: 12/12/2022 CLINICAL DATA:  Pleural effusion. EXAM: PORTABLE CHEST 1 VIEW COMPARISON:  CT and radiograph 12/10/2022 FINDINGS: Retrocardiac opacity with similar small left pleural effusion. No significant change from prior exam. Improvement in left upper lobe opacity. Slight increase in right basilar opacity with small right pleural effusion. Stable heart size and mediastinal contours. No pneumothorax. No pulmonary edema. IMPRESSION: 1. Unchanged left basilar opacity with small left pleural effusion. 2. Slight increase in right basilar opacity with small  right pleural effusion. 3. Improvement in left upper lobe opacity. Electronically Signed   By: Narda Rutherford M.D.   On: 12/12/2022 10:57   IR US CHEST  Result Date: 12/11/2022 CLINICAL DATA:  Patient admitted with pneumonia with concern for  loculated pleural effusion. Please perform chest ultrasound and ultrasound-guided thoracentesis as indicated. EXAM: CHEST ULTRASOUND COMPARISON:  Chest CT-12/09/2021 FINDINGS: Sonographic evaluation of the chest, performed by the dictating interventional radiologist, demonstrates no significant fluid within either the right or left chest. As such, no thoracentesis was attempted. Dense consolidation is noted within the bilateral lung bases, left-greater-than-right, similar to preceding chest CT. IMPRESSION: No significant pleural fluid is identified bilaterally. No thoracentesis attempted. Electronically Signed   By: Simonne Come M.D.   On: 12/11/2022 12:36   CT CHEST WO CONTRAST  Result Date: 12/10/2022 CLINICAL DATA:  Pneumonia suspected EXAM: CT CHEST WITHOUT CONTRAST TECHNIQUE: Multidetector CT imaging of the chest was performed following the standard protocol without IV contrast. RADIATION DOSE REDUCTION: This exam was performed according to the departmental dose-optimization program which includes automated exposure control, adjustment of the mA and/or kV according to patient size and/or use of iterative reconstruction technique. COMPARISON:  CTA chest dated Nov 30, 2022 FINDINGS: Cardiovascular: Normal heart size. No pericardial effusion. Normal caliber thoracic aorta with severe calcified plaque. Severe left main and three-vessel coronary artery calcifications. Mediastinum/Nodes: Small hiatal hernia. Thyroid is unremarkable. Stable mildly enlarged mediastinal lymph nodes. Reference subcarinal lymph node measuring 1.5 cm in short axis on series 3, image 33. Lungs/Pleura: Central airways are patent. Left-greater-than-right lower lobe consolidations, decreased when compared with the prior exam. Numerous scattered small solid pulmonary nodules, similar to prior exam. New left-greater-than-right small pleural effusions. Left-sided effusion has areas of loculation. Upper Abdomen: Simple appearing cyst of the left  kidney, no specific follow-up imaging is recommended. Musculoskeletal: No chest wall mass or suspicious bone lesions identified. IMPRESSION: 1. New left-greater-than-right small pleural effusions. Left-sided effusion has areas of loculation, concerning for empyema. 2. Bilateral lower lobe consolidations are decreased when compared with the prior exam, consistent with resolving pneumonia. 3. Numerous scattered small solid pulmonary nodules, similar to prior exam likely infectious. Recommend follow-up chest CT in 3 months to ensure resolution. 4. Stable mildly enlarged mediastinal lymph nodes. 5. Aortic Atherosclerosis (ICD10-I70.0). Electronically Signed   By: Allegra Lai M.D.   On: 12/10/2022 12:58   DG Chest Port 1 View  Result Date: 12/10/2022 CLINICAL DATA:  [id number] with shortness of breath. EXAM: PORTABLE CHEST 1 VIEW COMPARISON:  AP Lat 12/07/2022 FINDINGS: There is mild cardiomegaly without overt CHF. There is persistent left lower lobe consolidation. Small focal airspace disease peripherally in the left upper lobe. Aeration in the left lung is not notably changed. There is increased patchy consolidation in the right lower lung field. Right upper lung field is clear. There are small pleural effusions which appear similar. The mediastinum is normally outlined with patchy aortic calcifications. There is thoracic spondylosis.  Multiple overlying telemetry leads. IMPRESSION: 1. Increased patchy consolidation in the right lower lung field. 2. No significant change in left lower lobe consolidation and small focal peripheral left upper lobe opacity. 3. Stable small pleural effusions. 4. Mild cardiomegaly without overt CHF. 5. Aortic atherosclerosis. Electronically Signed   By: Almira Bar M.D.   On: 12/10/2022 07:41   DG Chest 2 View  Result Date: 12/07/2022 CLINICAL DATA:  Shortness of breath. EXAM: CHEST - 2 VIEW COMPARISON:  12/02/2022. FINDINGS: Improved aeration of the lung bases with  persistent  consolidation in the left lower lobe. Trace bilateral pleural effusions. No pneumothorax. Stable cardiac and mediastinal contours. IMPRESSION: Improved aeration of the lung bases with persistent consolidation in the left lower lobe. Trace bilateral pleural effusions. Electronically Signed   By: Orvan Falconer M.D.   On: 12/07/2022 13:42   DG Chest Port 1 View  Result Date: 12/02/2022 CLINICAL DATA:  Shortness of breath. Congestion. Evaluate for pneumonia. Pulmonary edema. EXAM: PORTABLE CHEST 1 VIEW COMPARISON:  Chest radiographs 12/01/2022, 11/30/2022, 08/08/2022; CT chest 11/30/2022 FINDINGS: Cardiac silhouette and mediastinal contours are within normal limits. No significant change in left basilar heterogeneous airspace opacity corresponding to the high-grade left lower lobe consolidation seen on 11/30/2022 CT. Mild chronic bilateral interstitial thickening is similar to multiple prior radiographs and appears chronic, possibly scarring. No pneumothorax. No definite pleural effusion. No acute skeletal abnormality. IMPRESSION: No significant change in left basilar heterogeneous airspace opacity corresponding to the high-grade left lower lobe consolidation seen on 11/30/2022 CT. This is again concerning for pneumonia or aspiration. Electronically Signed   By: Neita Garnet M.D.   On: 12/02/2022 09:31   ECHOCARDIOGRAM COMPLETE  Result Date: 12/01/2022    ECHOCARDIOGRAM REPORT   Patient Name:   FIELDING LAGRONE [facility] Date of Exam: 12/01/2022 Medical Rec #:  [id number]             Height:       65.0 in Accession #:    [id number]            Weight:       141.5 lb Date of Birth:  12/31/1945              BSA:          1.708 m Patient Age:    76 years              BP:           109/62 mmHg Patient Gender: M                     HR:           88 bpm. Exam Location:  Inpatient Procedure: 2D Echo, Cardiac Doppler and Color Doppler Indications:    SBE I33.9  History:        Patient has no prior history of Echocardiogram  examinations.                 COPD; Risk Factors:Hypertension and Diabetes.  Sonographer:    Lucendia Herrlich Referring Phys: [phone] SEYED A SHAHMEHDI IMPRESSIONS  1. Left ventricular ejection fraction, by estimation, is 60 to 65%. The left ventricle has normal function. The left ventricle has no regional wall motion abnormalities. Left ventricular diastolic parameters were normal.  2. Right ventricular systolic function is normal. The right ventricular size is normal.  3. Left atrial size was mildly dilated.  4. The mitral valve is normal in structure. No evidence of mitral valve regurgitation. No evidence of mitral stenosis.  5. The aortic valve is normal in structure. Aortic valve regurgitation is not visualized. No aortic stenosis is present.  6. The inferior vena cava is dilated in size with >50% respiratory variability, suggesting right atrial pressure of 8 mmHg. FINDINGS  Left Ventricle: Left ventricular ejection fraction, by estimation, is 60 to 65%. The left ventricle has normal function. The left ventricle has no regional wall motion abnormalities. The left ventricular internal cavity size was normal in size. There is  no left ventricular  hypertrophy. Left ventricular diastolic parameters were normal. Right Ventricle: The right ventricular size is normal. No increase in right ventricular wall thickness. Right ventricular systolic function is normal. Left Atrium: Left atrial size was mildly dilated. Right Atrium: Right atrial size was normal in size. Pericardium: There is no evidence of pericardial effusion. Mitral Valve: The mitral valve is normal in structure. No evidence of mitral valve regurgitation. No evidence of mitral valve stenosis. Tricuspid Valve: The tricuspid valve is normal in structure. Tricuspid valve regurgitation is not demonstrated. No evidence of tricuspid stenosis. Aortic Valve: The aortic valve is normal in structure. Aortic valve regurgitation is not visualized. No aortic stenosis is  present. Aortic valve peak gradient measures 7.3 mmHg. Pulmonic Valve: The pulmonic valve was normal in structure. Pulmonic valve regurgitation is not visualized. No evidence of pulmonic stenosis. Aorta: The aortic root is normal in size and structure. Venous: The inferior vena cava is dilated in size with greater than 50% respiratory variability, suggesting right atrial pressure of 8 mmHg. IAS/Shunts: No atrial level shunt detected by color flow Doppler.  LEFT VENTRICLE PLAX 2D LVIDd:         4.90 cm   Diastology LVIDs:         3.20 cm   LV e' medial:    7.62 cm/s LV PW:         1.20 cm   LV E/e' medial:  8.6 LV IVS:        0.90 cm   LV e' lateral:   11.90 cm/s LVOT diam:     1.90 cm   LV E/e' lateral: 5.5 LV SV:         51 LV SV Index:   30 LVOT Area:     2.84 cm                           3D Volume EF:                          3D EF:        61 %                          LV EDV:       133 ml                          LV ESV:       52 ml                          LV SV:        81 ml RIGHT VENTRICLE             IVC RV S prime:     17.10 cm/s  IVC diam: 2.20 cm TAPSE (M-mode): 1.6 cm LEFT ATRIUM           Index        RIGHT ATRIUM           Index LA diam:      4.60 cm 2.69 cm/m   RA Area:     15.40 cm LA Vol (A2C): 67.7 ml 39.64 ml/m  RA Volume:   35.50 ml  20.79 ml/m LA Vol (A4C): 65.1 ml 38.12 ml/m  AORTIC VALVE AV Area (Vmax): 2.27 cm AV Vmax:  135.00 cm/s AV Peak Grad:   7.3 mmHg LVOT Vmax:      108.00 cm/s LVOT Vmean:     69.600 cm/s LVOT VTI:       0.179 m  AORTA Ao Root diam: 3.70 cm Ao Asc diam:  3.30 cm MITRAL VALVE MV Area (PHT): 3.85 cm    SHUNTS MV Decel Time: 197 msec    Systemic VTI:  0.18 m MV E velocity: 65.50 cm/s  Systemic Diam: 1.90 cm MV A velocity: 90.70 cm/s MV E/A ratio:  0.72 Aditya Sabharwal Electronically signed by Dorthula Nettles Signature Date/Time: 12/01/2022/4:18:07 PM    Final    DG CHEST PORT 1 VIEW  Result Date: 12/01/2022 CLINICAL DATA:  Shortness of breath. EXAM:  PORTABLE CHEST 1 VIEW COMPARISON:  11/30/2022 FINDINGS: Slightly enlarged central vascular and interstitial lung markings. Findings concerning for at least mild pulmonary edema. Again noted is consolidation at the left lung base. Heart and mediastinum are grossly stable. Trachea remains midline. Negative for a pneumothorax. IMPRESSION: 1. Persistent consolidation and airspace disease at the left lung base. 2. Concern for mild pulmonary edema. Electronically Signed   By: Richarda Overlie M.D.   On: 12/01/2022 14:25   CT Angio Chest Pulmonary Embolism (PE) W or WO Contrast  Result Date: 11/30/2022 CLINICAL DATA:  Hypoxia EXAM: CT ANGIOGRAPHY CHEST WITH CONTRAST TECHNIQUE: Multidetector CT imaging of the chest was performed using the standard protocol during bolus administration of intravenous contrast. Multiplanar CT image reconstructions and MIPs were obtained to evaluate the vascular anatomy. RADIATION DOSE REDUCTION: This exam was performed according to the departmental dose-optimization program which includes automated exposure control, adjustment of the mA and/or kV according to patient size and/or use of iterative reconstruction technique. CONTRAST:  75mL OMNIPAQUE IOHEXOL 350 MG/ML SOLN COMPARISON:  09/23/2020 FINDINGS: Despite efforts by the technologist and patient, motion artifact is present on today's exam and could not be eliminated. This reduces exam sensitivity and specificity. Cardiovascular: No filling defect is identified in the pulmonary arterial tree to suggest pulmonary embolus. Coronary, aortic arch, and branch vessel atherosclerotic vascular disease. Mediastinum/Nodes: 1.2 cm right hilar lymph node. 1.0 cm left infrahilar lymph node. 1.2 cm subcarinal lymph node. Small type 1 hiatal hernia.  Mildly dilated distal esophagus. Lungs/Pleura: Consolidation of most of the left lower lobe aside from the superior segment. Substantial airspace opacity posteriorly in the right lower lobe, especially in the  posterior basal segment. Appearance suspicious for multilobar pneumonia or aspiration pneumonitis. Patchy reticulonodular opacities are present in both upper lobes along with some bandlike atelectasis at the lung apices. Benign 3 mm calcified granuloma noted in the left upper lobe on image 66 series 6. Upper Abdomen: Abdominal aortic atherosclerosis. Separate origin of the splenic artery from the rest of the celiac trunk; atheromatous calcification at the origins of the upper abdominal aortic branches without appreciable occlusion. Musculoskeletal: Sternoclavicular arthropathy bilaterally. Review of the MIP images confirms the above findings. IMPRESSION: 1. No filling defect is identified in the pulmonary arterial tree to suggest pulmonary embolus. To motion artifact. Reduced sensitivity due 2. Consolidation of most of the left lower lobe aside from the superior segment. Substantial airspace opacity posteriorly in the right lower lobe, especially in the posterior basal segment. Appearance suspicious for multilobar pneumonia or aspiration pneumonitis. 3. Patchy reticulonodular opacities are present in both upper lobes along with some bandlike atelectasis at the lung apices. 4. Mild bilateral hilar and subcarinal adenopathy, probably reactive. 5. Small type 1 hiatal hernia. Mildly dilated distal  esophagus. 6. Aortic atherosclerosis. Aortic Atherosclerosis (ICD10-I70.0). Electronically Signed   By: Gaylyn Rong M.D.   On: 11/30/2022 18:00   CT Renal Stone Study  Result Date: 11/30/2022 CLINICAL DATA:  Abdominal/flank pain, stone suspected.  Weakening EXAM: CT ABDOMEN AND PELVIS WITHOUT CONTRAST TECHNIQUE: Multidetector CT imaging of the abdomen and pelvis was performed following the standard protocol without IV contrast. RADIATION DOSE REDUCTION: This exam was performed according to the departmental dose-optimization program which includes automated exposure control, adjustment of the mA and/or kV according  to patient size and/or use of iterative reconstruction technique. COMPARISON:  Chest radiograph performed earlier on the same date. FINDINGS: Lower chest: Bilateral lower lobe consolidations left greater than the right with air bronchograms concerning for bilateral pneumonia. Prominent atherosclerotic calcification of coronary arteries. Hepatobiliary: No focal liver abnormality is seen. No gallstones, gallbladder wall thickening, or biliary dilatation. Pancreas: Generalized pancreatic atrophy no pancreatic ductal dilatation or surrounding inflammatory changes. Spleen: Normal in size without focal abnormality. Adrenals/Urinary Tract: Adrenal glands are unremarkable. Kidneys are normal, without renal calculi, focal lesion, or hydronephrosis. Exophytic bilateral renal cysts which contains mildly complex fluid. The interpolar right renal cyst measures a proximally 2.2 x 2.7 cm and in the lower pole of the left kidney measures a proximally 2.4 x 2.3 cm. Foley's catheter in the urinary bladder with trace amount of air likely iatrogenic. Stomach/Bowel: Stomach is within normal limits. Appendix appears normal. No evidence of bowel wall thickening, distention, or inflammatory changes. Sigmoid colonic diverticulosis without evidence of acute diverticulitis. Vascular/Lymphatic: Aortic atherosclerosis. No enlarged abdominal or pelvic lymph nodes. Reproductive: Prostate is unremarkable. Other: No abdominal wall hernia or abnormality. No abdominopelvic ascites. 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 No acute osseous abnormality. IMPRESSION: 1. Bilateral lower lobe consolidations left greater than the right with air bronchograms concerning for bilateral pneumonia. 2. No evidence of nephrolithiasis or hydronephrosis. 3. Sigmoid colonic diverticulosis without evidence of acute diverticulitis. 4. Aortic atherosclerosis and coronary artery calcification. 5.  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 No acute osseous abnormality. Aortic Atherosclerosis (ICD10-I70.0). Electronically Signed   By: Larose Hires D.O.   On: 11/30/2022 16:19   DG Chest Port 1 View  Result Date: 11/30/2022 CLINICAL DATA:  hypoxia EXAM: PORTABLE CHEST - 1 VIEW COMPARISON:  08/08/2022 FINDINGS: Relatively low lung volumes. New consolidation/atelectasis at the left lung base obscuring the diaphragmatic leaflet. Mild scattered perihilar of the lower opacities left greater than right. Heart size and mediastinal contours are within normal limits. No effusion. Visualized bones unremarkable. IMPRESSION: New left lower lobe consolidation/atelectasis. Electronically Signed   By: Corlis Leak M.D.   On: 11/30/2022 14:54    Micro Results    Recent Results (from the past 240 hour(s))  SARS Coronavirus 2 by RT PCR (hospital order, performed in [facility] hospital lab) *cepheid single result test* Anterior Nasal Swab     Status: None   Collection Time: 12/07/22  5:11 PM   Specimen: Anterior Nasal Swab  Result Value Ref Range Status   SARS Coronavirus 2 by RT PCR NEGATIVE NEGATIVE Final    Comment: Performed at [facility], [address], [place], [place] [address]  Expectorated Sputum Assessment w Gram Stain, Rflx to Resp Cult     Status: None   Collection Time: 12/07/22  6:42 PM   Specimen: Expectorated Sputum  Result Value Ref Range Status   Specimen Description EXPECTORATED SPUTUM  Final   Special Requests NONE  Final   Sputum evaluation   Final    Sputum specimen not acceptable for testing.  Please recollect.   notified RN KYRA H ON 12/08/22 @ 1824 BY DRT Performed at [facility], [address], [place], [place] [address]    Report Status 12/08/2022 FINAL  Final  MRSA Next Gen by PCR, Nasal     Status: Abnormal   Collection Time: 12/07/22 10:00 PM   Specimen: Nasal Mucosa; Nasal Swab  Result Value Ref Range  Status   MRSA by PCR Next Gen DETECTED (A) NOT DETECTED Final    Comment: RESULTS CALLED TO. READ BACK BY AND VERIFIED WITH RN R.GININWA ON 12/08/22 AT 0153 BY NM  (NOTE) The GeneXpert MRSA Assay (FDA approved for NASAL specimens only), is one component of a comprehensive MRSA colonization surveillance program. It is not intended to diagnose MRSA infection nor to guide or monitor treatment for MRSA infections. Test performance is not FDA approved in patients less than 3 years old. Performed at [facility], [address], [place], [place] [address]     Today   Subjective    Travis Beck today has no headache, minimal pleuritic left-sided chest pain, no abdominal pain, no new weakness tingling or numbness, feels much better wants to go home today.    Objective   Blood pressure 139/73, pulse 72, temperature 97.7 F (36.5 C), temperature source Oral, resp. rate 18, height 5\' 5"  (1.651 m), weight 64.2 kg, SpO2 98 %.   Intake/Output Summary (Last 24 hours) at 12/13/2022 0900 Last data filed at 12/13/2022 0500 Gross per 24 hour  Intake 240 ml  Output 1050 ml  Net -810 ml    Exam  Awake Alert, No new F.N deficits,    [place].AT,PERRAL Supple Neck,   Symmetrical Chest wall movement, Good air movement bilaterally, CTAB RRR,No Gallops,   +ve B.Sounds, Abd Soft, Non tender,  No Cyanosis, Clubbing or edema    Data Review   Recent Labs  Lab 12/07/22 1315 12/08/22 0449 12/09/22 0215 12/10/22 0326 12/11/22 0554 12/12/22 0411  WBC 12.6* 9.4 9.4 9.7 9.5 7.9  HGB 10.9* 10.0* 9.8* 10.1* 10.8* 10.0*  HCT 33.4* 29.5* 29.0* 30.9* 32.7* 30.7*  PLT 338 356 333 416* 529* 531*  MCV 90.8 88.3 89.8 88.5 89.8 89.0  MCH 29.6 29.9 30.3 28.9 29.7 29.0  MCHC 32.6 33.9 33.8 32.7 33.0 32.6  RDW 13.2 13.3 13.2 13.3 13.2 12.9  LYMPHSABS 1.1  --   --   --   --  1.0  MONOABS 0.8  --   --   --   --  0.7  EOSABS 0.2  --   --   --   --  0.3  BASOSABS 0.1  --   --   --   --  0.1    Recent  Labs  Lab 12/07/22 1315 12/08/22 0449 12/09/22 0215 12/09/22 1531 12/10/22 0326 12/11/22 0554 12/12/22 0411  NA 131* 134* 130*  --  132* 130* 131*  K 3.3* 2.9* 3.3*  --  4.6 4.6 4.4  CL 91* 96* 94*  --  96* 95* 95*  CO2 27 26 26   --  28 26 26   ANIONGAP 13 12 10   --  8 9 10   GLUCOSE 170* 97 198*  --  187* 190* 171*  BUN 14 11 7*  --  8 10 12   CREATININE 0.82 0.76 0.78  --  0.69  0.90 0.94  CRP  --   --   --  20.0* 17.5* 17.9* 14.7*  PROCALCITON 0.83 1.00  --  0.35 0.32 0.21 0.20  INR  --   --   --   --   --  1.2  --   BNP 99.0  --   --   --  241.4* 227.8* 250.8*  MG  --   --   --   --  1.9 1.8 1.9  CALCIUM 8.2* 7.6* 7.7*  --  7.9* 8.4* 8.4*    Total Time in preparing paper work, data evaluation and todays exam - 35 minutes  Signature  -    Susa Raring M.D on 12/13/2022 at 9:00 AM   -  To page go to [email or web address]

## 2022-12-13 NOTE — Consult Note (Signed)
   [facility] CM Inpatient Consult   12/13/2022  Draken Mckiney 1945/12/03 [id number]  [facility] [[facility]]  Accountable Care Organization [ACO] Patient: Medicare ACO Reach  Primary Care Provider:  Kirstie Peri, MD with [facility] Internal [facility]  *Patient on contact precaution, PPE donned  Patient is currently active with [facility] [[facility]] Care Management for chronic disease management services.  Patient has been engaged by a [facility] RN CC.  Our community based plan of care has focused on disease management and community resource support.   Patient preparing for transitioning home on rounds awaiting DME for hoe oxygen and for Home with [facility] per inpatient [facility] RNCM notes for [facility]. Patient resting on rounds. Met with patient at the bedside.  Patient endorses PCP and ongoing care coordination follow up with [facility] RN.  Patient was concern for a generalized body rash in which he still has concerns for. Explained [facility] RN for ongoing post hospital follow up. No need needs except on oxygen now. Patient was given a 24 hour nurse advise line magnet and an appointment reminder card.  Plan: Continue to follow for additional needs and will update [facility] RN CC about concerns for rash and now on oxygen.  Of note, [facility] Care Management services does not replace or interfere with any services that are needed or arranged by inpatient [facility] care management team.   For additional questions or referrals please contact:  Charlesetta Shanks, RN BSN CCM [facility]  [phone] business mobile phone Toll free office [phone]  *Concierge Line  [phone] Fax number: [phone] [email or web address]

## 2022-12-15 DIAGNOSIS — Z7984 Long term (current) use of oral hypoglycemic drugs: Secondary | ICD-10-CM | POA: Diagnosis not present

## 2022-12-15 DIAGNOSIS — J440 Chronic obstructive pulmonary disease with acute lower respiratory infection: Secondary | ICD-10-CM | POA: Diagnosis not present

## 2022-12-15 DIAGNOSIS — J15212 Pneumonia due to Methicillin resistant Staphylococcus aureus: Secondary | ICD-10-CM | POA: Diagnosis not present

## 2022-12-15 DIAGNOSIS — I10 Essential (primary) hypertension: Secondary | ICD-10-CM | POA: Diagnosis not present

## 2022-12-15 DIAGNOSIS — G894 Chronic pain syndrome: Secondary | ICD-10-CM | POA: Diagnosis not present

## 2022-12-15 DIAGNOSIS — E785 Hyperlipidemia, unspecified: Secondary | ICD-10-CM | POA: Diagnosis not present

## 2022-12-15 DIAGNOSIS — F411 Generalized anxiety disorder: Secondary | ICD-10-CM | POA: Diagnosis not present

## 2022-12-15 DIAGNOSIS — E119 Type 2 diabetes mellitus without complications: Secondary | ICD-10-CM | POA: Diagnosis not present

## 2022-12-15 DIAGNOSIS — Z7951 Long term (current) use of inhaled steroids: Secondary | ICD-10-CM | POA: Diagnosis not present

## 2022-12-20 ENCOUNTER — Encounter: Payer: Self-pay | Admitting: Adult Health

## 2022-12-20 ENCOUNTER — Ambulatory Visit (INDEPENDENT_AMBULATORY_CARE_PROVIDER_SITE_OTHER): Payer: Medicare Other | Admitting: Adult Health

## 2022-12-20 ENCOUNTER — Telehealth: Payer: Self-pay

## 2022-12-20 ENCOUNTER — Ambulatory Visit (INDEPENDENT_AMBULATORY_CARE_PROVIDER_SITE_OTHER): Payer: Medicare Other

## 2022-12-20 VITALS — BP 132/80 | HR 85 | Temp 98.2°F | Ht 65.0 in | Wt 158.2 lb

## 2022-12-20 DIAGNOSIS — J189 Pneumonia, unspecified organism: Secondary | ICD-10-CM

## 2022-12-20 DIAGNOSIS — J441 Chronic obstructive pulmonary disease with (acute) exacerbation: Secondary | ICD-10-CM | POA: Diagnosis not present

## 2022-12-20 DIAGNOSIS — J45901 Unspecified asthma with (acute) exacerbation: Secondary | ICD-10-CM | POA: Diagnosis not present

## 2022-12-20 DIAGNOSIS — Z79899 Other long term (current) drug therapy: Secondary | ICD-10-CM | POA: Diagnosis not present

## 2022-12-20 DIAGNOSIS — M79606 Pain in leg, unspecified: Secondary | ICD-10-CM | POA: Diagnosis not present

## 2022-12-20 DIAGNOSIS — R0902 Hypoxemia: Secondary | ICD-10-CM

## 2022-12-20 DIAGNOSIS — G894 Chronic pain syndrome: Secondary | ICD-10-CM | POA: Diagnosis not present

## 2022-12-20 DIAGNOSIS — Z79891 Long term (current) use of opiate analgesic: Secondary | ICD-10-CM | POA: Diagnosis not present

## 2022-12-20 DIAGNOSIS — J90 Pleural effusion, not elsewhere classified: Secondary | ICD-10-CM | POA: Diagnosis not present

## 2022-12-20 DIAGNOSIS — M549 Dorsalgia, unspecified: Secondary | ICD-10-CM | POA: Diagnosis not present

## 2022-12-20 DIAGNOSIS — M25519 Pain in unspecified shoulder: Secondary | ICD-10-CM | POA: Diagnosis not present

## 2022-12-20 NOTE — Assessment & Plan Note (Signed)
Continue on oxygen to maintain O2 saturations greater than 88 to 90%.  Will check walk test on follow-up visit.

## 2022-12-20 NOTE — Telephone Encounter (Signed)
Can you run a ticket on patients insurance to see what else is covered?

## 2022-12-20 NOTE — Patient Instructions (Addendum)
Finish Zyvox as planned.  Continue on Trelegy 1 puff daily, rinse after use. (Samples given) Patient assistance paperwork for Trelegy . If you are not approved then we need to look nebulizer medications) .  Albuterol inhaler As needed   Aspiration precautions.  Use caution with sedating medications.  Chest xray today  Follow up with Dr. Isaiah Serge in 2 weeks and As needed  Please contact office for sooner follow up if symptoms do not improve or worsen or seek emergency care

## 2022-12-20 NOTE — Progress Notes (Signed)
Yes, Dilantin for the meeting in the morning  @Patient  ID: Travis Beck, male    DOB: 1946-03-04, 77 y.o.   MRN: [id number]  Chief Complaint  Patient presents with   Hospitalization Follow-up    Referring provider: Kirstie Peri, MD  HPI: 77 year old male followed for COPD prone to frequent exacerbations and recurrent pneumonia  TEST/EVENTS :  High-resolution CT chest September 23, 2020 negative for interstitial lung disease, mild postinfectious pulmonary parenchymal scarring   12/20/2022 Follow up : COPD, pneumonia, post hospital follow-up Patient returns for a posthospital follow-up.  Patient was last seen in office December 2023.  Has underlying COPD.  Patient has been hospitalized twice in the last month for multifocal pneumonia.  Initially admitted with septic shock secondary to multifocal pneumonia.  Patient was treated with aggressive antibiotics.  And discharged home.  He returned few days later with fevers and worsening shortness of breath.  Sputum culture showed MRSA.  CT chest was negative for PE.  Showed significant left lower lobe consolidation questionable parapneumonic effusion versus empyema secondary to MRSA pneumonia.  Only had a trace left-sided loculated effusion not amenable to thoracentesis or chest tube placement.  Patient was recommended to continue on Zyvox with a 2-week course at discharge.  Patient is on Trelegy for his underlying COPD but has not been able to afford.  Insurance does not cover inhalers very well.  Patient previously had been on patient assistance.  We discussed filling out paperwork again today.  If this does not work we will need to switch over to nebulized medications.  He did require oxygen at discharge.  Patient says he is wearing his oxygen 2 L with activity.  Patient is feeling better since discharge.  Cough and congestion have improved.  He appetite started pick up.  He did have an episode where he vomited last night after eating soup he is not  sure what caused it but he felt better afterwards had no further vomiting.  Patient denies any difficulty swallowing.  Patient is on multiple sedating medications including Xanax, pain medications, gabapentin, Mirapex.  Long discussion with patient and his sister regarding potential for sedating effects especially using multiple sedating medications. We patient says he had a severe diffuse rash while in the hospital this is getting better.   Allergies  Allergen Reactions   Penicillins Rash    No problems with ampicillin during hospitalization    Immunization History  Administered Date(s) Administered   Fluad Quad(high Dose 65+) 05/06/2020, 04/10/2022   Influenza, High Dose Seasonal PF 04/09/2017, 04/22/2018, 04/10/2019, 05/02/2019, 04/04/2021   Moderna Sars-Covid-2 Vaccination 09/24/2019, 10/20/2019, 06/22/2020   Tdap 02/04/2018    Past Medical History:  Diagnosis Date   Arthritis    Asthma    Childhood   Emphysema lung (HCC)    Essential hypertension    Ischemic heart disease    Myoview 2020 indicating inferior infarct scar with mild peri-infarct ischemia - managed medically   Type 2 diabetes mellitus (HCC)     Tobacco History: Social History   Tobacco Use  Smoking Status Former   Packs/day: 2.00   Years: 33.00   Additional pack years: 0.00   Total pack years: 66.00   Types: Cigarettes   Quit date: 01/22/1993   Years since quitting: 29.9  Smokeless Tobacco Never   Counseling given: Not Answered   Outpatient Medications Prior to Visit  Medication Sig Dispense Refill   acetaminophen (TYLENOL) 325 MG tablet Take 2 tablets (650 mg total) by mouth every  6 (six) hours as needed for mild pain (or Fever >/= 101). 12 tablet 0   albuterol (VENTOLIN HFA) 108 (90 Base) MCG/ACT inhaler Inhale 2 puffs into the lungs every 4 (four) hours as needed for wheezing or shortness of breath. 18 g 0   ALPRAZolam (XANAX) 1 MG tablet Take 1 tablet (1 mg total) by mouth 3 (three) times daily  as needed for anxiety. 30 tablet 0   amLODipine (NORVASC) 5 MG tablet Take 5 mg by mouth at bedtime.     aspirin EC 81 MG tablet Take 1 tablet (81 mg total) by mouth daily. Swallow whole. 90 tablet 3   Budeson-Glycopyrrol-Formoterol (BREZTRI AEROSPHERE) 160-9-4.8 MCG/ACT AERO Inhale 2 puffs into the lungs in the morning and at bedtime. 10.7 g 0   buprenorphine (SUBUTEX) 8 MG SUBL SL tablet Place 4-8 mg under the tongue See admin instructions. Take 8 mg by mouth in the morning and then take 4 mg by mouth at bedtime.     cetirizine (ZYRTEC) 10 MG tablet Take 10 mg by mouth as needed for allergies (itching).     fluticasone (FLONASE) 50 MCG/ACT nasal spray Place 2 sprays into both nostrils daily.      gabapentin (NEURONTIN) 400 MG capsule Take 1 capsule (400 mg total) by mouth every 8 (eight) hours as needed. (Patient taking differently: Take 400 mg by mouth every 8 (eight) hours as needed (pain).)  1   glipiZIDE (GLUCOTROL XL) 10 MG 24 hr tablet Take 10 mg by mouth daily.     guaiFENesin (MUCINEX) 600 MG 12 hr tablet Take 1 tablet (600 mg total) by mouth 2 (two) times daily as needed for cough. 30 tablet 0   linezolid (ZYVOX) 600 MG tablet Take 1 tablet (600 mg total) by mouth every 12 (twelve) hours. 28 tablet 0   metFORMIN (GLUCOPHAGE) 1000 MG tablet Take 1,000 mg by mouth 2 (two) times daily with a meal.      montelukast (SINGULAIR) 10 MG tablet Take 1 tablet (10 mg total) by mouth at bedtime. 30 tablet 11   pantoprazole (PROTONIX) 40 MG tablet Take 1 tablet (40 mg total) by mouth daily. 90 tablet 3   pramipexole (MIRAPEX) 0.5 MG tablet Take 0.5 mg by mouth at bedtime.     pravastatin (PRAVACHOL) 20 MG tablet Take 20 mg by mouth every evening.     valsartan (DIOVAN) 160 MG tablet Take 160 mg by mouth daily.     No facility-administered medications prior to visit.     Review of Systems:   Constitutional:   No  weight loss, night sweats,  Fevers, chills,  +fatigue, or  lassitude.  HEENT:    No headaches,  Difficulty swallowing,  Tooth/dental problems, or  Sore throat,                No sneezing, itching, ear ache, nasal congestion, post nasal drip,   CV:  No chest pain,  Orthopnea, PND, swelling in lower extremities, anasarca, dizziness, palpitations, syncope.   GI  No heartburn, indigestion, abdominal pain, nausea, vomiting, diarrhea, change in bowel habits, loss of appetite, bloody stools.   Resp:   No chest wall deformity  Skin: no rash or lesions.  GU: no dysuria, change in color of urine, no urgency or frequency.  No flank pain, no hematuria   MS:  No joint pain or swelling.  No decreased range of motion.  No back pain.    Physical Exam  BP 132/80 (BP Location: Left  Arm, Patient Position: Sitting, Cuff Size: Normal)   Pulse 85   Temp 98.2 F (36.8 C) (Oral)   Ht 5\' 5"  (1.651 m)   Wt 158 lb 3.2 oz (71.8 kg)   SpO2 92%   BMI 26.33 kg/m   GEN: A/Ox3; pleasant , NAD, frail, elderly, on oxygen   HEENT:  [place]/AT,  , NOSE-clear, THROAT-clear, no lesions, no postnasal drip or exudate noted.   NECK:  Supple w/ fair ROM; no JVD; normal carotid impulses w/o bruits; no thyromegaly or nodules palpated; no lymphadenopathy.    RESP few scattered rhonchi  no accessory muscle use, no dullness to percussion  CARD:  RRR, no m/r/g, no peripheral edema, pulses intact, no cyanosis or clubbing.  GI:   Soft & nt; nml bowel sounds; no organomegaly or masses detected.   Musco: Warm bil, no deformities or joint swelling noted.   Neuro: alert, no focal deficits noted.    Skin: Warm, small scattered papular rash along legs and trunk      Lab Results:  CBC   BMET  ProBNP No results found for: "PROBNP"  Imaging: DG CHEST PORT 1 VIEW  Result Date: 12/12/2022 CLINICAL DATA:  Pleural effusion. EXAM: PORTABLE CHEST 1 VIEW COMPARISON:  CT and radiograph 12/10/2022 FINDINGS: Retrocardiac opacity with similar small left pleural effusion. No significant change from prior exam.  Improvement in left upper lobe opacity. Slight increase in right basilar opacity with small right pleural effusion. Stable heart size and mediastinal contours. No pneumothorax. No pulmonary edema. IMPRESSION: 1. Unchanged left basilar opacity with small left pleural effusion. 2. Slight increase in right basilar opacity with small right pleural effusion. 3. Improvement in left upper lobe opacity. Electronically Signed   By: Narda Rutherford M.D.   On: 12/12/2022 10:57   IR US CHEST  Result Date: 12/11/2022 CLINICAL DATA:  Patient admitted with pneumonia with concern for loculated pleural effusion. Please perform chest ultrasound and ultrasound-guided thoracentesis as indicated. EXAM: CHEST ULTRASOUND COMPARISON:  Chest CT-12/09/2021 FINDINGS: Sonographic evaluation of the chest, performed by the dictating interventional radiologist, demonstrates no significant fluid within either the right or left chest. As such, no thoracentesis was attempted. Dense consolidation is noted within the bilateral lung bases, left-greater-than-right, similar to preceding chest CT. IMPRESSION: No significant pleural fluid is identified bilaterally. No thoracentesis attempted. Electronically Signed   By: Simonne Come M.D.   On: 12/11/2022 12:36   CT CHEST WO CONTRAST  Result Date: 12/10/2022 CLINICAL DATA:  Pneumonia suspected EXAM: CT CHEST WITHOUT CONTRAST TECHNIQUE: Multidetector CT imaging of the chest was performed following the standard protocol without IV contrast. RADIATION DOSE REDUCTION: This exam was performed according to the departmental dose-optimization program which includes automated exposure control, adjustment of the mA and/or kV according to patient size and/or use of iterative reconstruction technique. COMPARISON:  CTA chest dated Nov 30, 2022 FINDINGS: Cardiovascular: Normal heart size. No pericardial effusion. Normal caliber thoracic aorta with severe calcified plaque. Severe left main and three-vessel coronary  artery calcifications. Mediastinum/Nodes: Small hiatal hernia. Thyroid is unremarkable. Stable mildly enlarged mediastinal lymph nodes. Reference subcarinal lymph node measuring 1.5 cm in short axis on series 3, image 33. Lungs/Pleura: Central airways are patent. Left-greater-than-right lower lobe consolidations, decreased when compared with the prior exam. Numerous scattered small solid pulmonary nodules, similar to prior exam. New left-greater-than-right small pleural effusions. Left-sided effusion has areas of loculation. Upper Abdomen: Simple appearing cyst of the left kidney, no specific follow-up imaging is recommended. Musculoskeletal: No chest wall  mass or suspicious bone lesions identified. IMPRESSION: 1. New left-greater-than-right small pleural effusions. Left-sided effusion has areas of loculation, concerning for empyema. 2. Bilateral lower lobe consolidations are decreased when compared with the prior exam, consistent with resolving pneumonia. 3. Numerous scattered small solid pulmonary nodules, similar to prior exam likely infectious. Recommend follow-up chest CT in 3 months to ensure resolution. 4. Stable mildly enlarged mediastinal lymph nodes. 5. Aortic Atherosclerosis (ICD10-I70.0). Electronically Signed   By: Allegra Lai M.D.   On: 12/10/2022 12:58   DG Chest Port 1 View  Result Date: 12/10/2022 CLINICAL DATA:  [id number] with shortness of breath. EXAM: PORTABLE CHEST 1 VIEW COMPARISON:  AP Lat 12/07/2022 FINDINGS: There is mild cardiomegaly without overt CHF. There is persistent left lower lobe consolidation. Small focal airspace disease peripherally in the left upper lobe. Aeration in the left lung is not notably changed. There is increased patchy consolidation in the right lower lung field. Right upper lung field is clear. There are small pleural effusions which appear similar. The mediastinum is normally outlined with patchy aortic calcifications. There is thoracic spondylosis.  Multiple  overlying telemetry leads. IMPRESSION: 1. Increased patchy consolidation in the right lower lung field. 2. No significant change in left lower lobe consolidation and small focal peripheral left upper lobe opacity. 3. Stable small pleural effusions. 4. Mild cardiomegaly without overt CHF. 5. Aortic atherosclerosis. Electronically Signed   By: Almira Bar M.D.   On: 12/10/2022 07:41   DG Chest 2 View  Result Date: 12/07/2022 CLINICAL DATA:  Shortness of breath. EXAM: CHEST - 2 VIEW COMPARISON:  12/02/2022. FINDINGS: Improved aeration of the lung bases with persistent consolidation in the left lower lobe. Trace bilateral pleural effusions. No pneumothorax. Stable cardiac and mediastinal contours. IMPRESSION: Improved aeration of the lung bases with persistent consolidation in the left lower lobe. Trace bilateral pleural effusions. Electronically Signed   By: Orvan Falconer M.D.   On: 12/07/2022 13:42   DG Chest Port 1 View  Result Date: 12/02/2022 CLINICAL DATA:  Shortness of breath. Congestion. Evaluate for pneumonia. Pulmonary edema. EXAM: PORTABLE CHEST 1 VIEW COMPARISON:  Chest radiographs 12/01/2022, 11/30/2022, 08/08/2022; CT chest 11/30/2022 FINDINGS: Cardiac silhouette and mediastinal contours are within normal limits. No significant change in left basilar heterogeneous airspace opacity corresponding to the high-grade left lower lobe consolidation seen on 11/30/2022 CT. Mild chronic bilateral interstitial thickening is similar to multiple prior radiographs and appears chronic, possibly scarring. No pneumothorax. No definite pleural effusion. No acute skeletal abnormality. IMPRESSION: No significant change in left basilar heterogeneous airspace opacity corresponding to the high-grade left lower lobe consolidation seen on 11/30/2022 CT. This is again concerning for pneumonia or aspiration. Electronically Signed   By: Neita Garnet M.D.   On: 12/02/2022 09:31   ECHOCARDIOGRAM COMPLETE  Result Date:  12/01/2022    ECHOCARDIOGRAM REPORT   Patient Name:   ELLIOT TWITE [facility] Date of Exam: 12/01/2022 Medical Rec #:  [id number]             Height:       65.0 in Accession #:    [id number]            Weight:       141.5 lb Date of Birth:  June 24, 1946              BSA:          1.708 m Patient Age:    40 years  BP:           109/62 mmHg Patient Gender: M                     HR:           88 bpm. Exam Location:  Inpatient Procedure: 2D Echo, Cardiac Doppler and Color Doppler Indications:    SBE I33.9  History:        Patient has no prior history of Echocardiogram examinations.                 COPD; Risk Factors:Hypertension and Diabetes.  Sonographer:    Lucendia Herrlich Referring Phys: [phone] SEYED A SHAHMEHDI IMPRESSIONS  1. Left ventricular ejection fraction, by estimation, is 60 to 65%. The left ventricle has normal function. The left ventricle has no regional wall motion abnormalities. Left ventricular diastolic parameters were normal.  2. Right ventricular systolic function is normal. The right ventricular size is normal.  3. Left atrial size was mildly dilated.  4. The mitral valve is normal in structure. No evidence of mitral valve regurgitation. No evidence of mitral stenosis.  5. The aortic valve is normal in structure. Aortic valve regurgitation is not visualized. No aortic stenosis is present.  6. The inferior vena cava is dilated in size with >50% respiratory variability, suggesting right atrial pressure of 8 mmHg. FINDINGS  Left Ventricle: Left ventricular ejection fraction, by estimation, is 60 to 65%. The left ventricle has normal function. The left ventricle has no regional wall motion abnormalities. The left ventricular internal cavity size was normal in size. There is  no left ventricular hypertrophy. Left ventricular diastolic parameters were normal. Right Ventricle: The right ventricular size is normal. No increase in right ventricular wall thickness. Right ventricular systolic function is  normal. Left Atrium: Left atrial size was mildly dilated. Right Atrium: Right atrial size was normal in size. Pericardium: There is no evidence of pericardial effusion. Mitral Valve: The mitral valve is normal in structure. No evidence of mitral valve regurgitation. No evidence of mitral valve stenosis. Tricuspid Valve: The tricuspid valve is normal in structure. Tricuspid valve regurgitation is not demonstrated. No evidence of tricuspid stenosis. Aortic Valve: The aortic valve is normal in structure. Aortic valve regurgitation is not visualized. No aortic stenosis is present. Aortic valve peak gradient measures 7.3 mmHg. Pulmonic Valve: The pulmonic valve was normal in structure. Pulmonic valve regurgitation is not visualized. No evidence of pulmonic stenosis. Aorta: The aortic root is normal in size and structure. Venous: The inferior vena cava is dilated in size with greater than 50% respiratory variability, suggesting right atrial pressure of 8 mmHg. IAS/Shunts: No atrial level shunt detected by color flow Doppler.  LEFT VENTRICLE PLAX 2D LVIDd:         4.90 cm   Diastology LVIDs:         3.20 cm   LV e' medial:    7.62 cm/s LV PW:         1.20 cm   LV E/e' medial:  8.6 LV IVS:        0.90 cm   LV e' lateral:   11.90 cm/s LVOT diam:     1.90 cm   LV E/e' lateral: 5.5 LV SV:         51 LV SV Index:   30 LVOT Area:     2.84 cm  3D Volume EF:                          3D EF:        61 %                          LV EDV:       133 ml                          LV ESV:       52 ml                          LV SV:        81 ml RIGHT VENTRICLE             IVC RV S prime:     17.10 cm/s  IVC diam: 2.20 cm TAPSE (M-mode): 1.6 cm LEFT ATRIUM           Index        RIGHT ATRIUM           Index LA diam:      4.60 cm 2.69 cm/m   RA Area:     15.40 cm LA Vol (A2C): 67.7 ml 39.64 ml/m  RA Volume:   35.50 ml  20.79 ml/m LA Vol (A4C): 65.1 ml 38.12 ml/m  AORTIC VALVE AV Area (Vmax): 2.27 cm AV Vmax:         135.00 cm/s AV Peak Grad:   7.3 mmHg LVOT Vmax:      108.00 cm/s LVOT Vmean:     69.600 cm/s LVOT VTI:       0.179 m  AORTA Ao Root diam: 3.70 cm Ao Asc diam:  3.30 cm MITRAL VALVE MV Area (PHT): 3.85 cm    SHUNTS MV Decel Time: 197 msec    Systemic VTI:  0.18 m MV E velocity: 65.50 cm/s  Systemic Diam: 1.90 cm MV A velocity: 90.70 cm/s MV E/A ratio:  0.72 Aditya Sabharwal Electronically signed by Dorthula Nettles Signature Date/Time: 12/01/2022/4:18:07 PM    Final    DG CHEST PORT 1 VIEW  Result Date: 12/01/2022 CLINICAL DATA:  Shortness of breath. EXAM: PORTABLE CHEST 1 VIEW COMPARISON:  11/30/2022 FINDINGS: Slightly enlarged central vascular and interstitial lung markings. Findings concerning for at least mild pulmonary edema. Again noted is consolidation at the left lung base. Heart and mediastinum are grossly stable. Trachea remains midline. Negative for a pneumothorax. IMPRESSION: 1. Persistent consolidation and airspace disease at the left lung base. 2. Concern for mild pulmonary edema. Electronically Signed   By: Richarda Overlie M.D.   On: 12/01/2022 14:25   CT Angio Chest Pulmonary Embolism (PE) W or WO Contrast  Result Date: 11/30/2022 CLINICAL DATA:  Hypoxia EXAM: CT ANGIOGRAPHY CHEST WITH CONTRAST TECHNIQUE: Multidetector CT imaging of the chest was performed using the standard protocol during bolus administration of intravenous contrast. Multiplanar CT image reconstructions and MIPs were obtained to evaluate the vascular anatomy. RADIATION DOSE REDUCTION: This exam was performed according to the departmental dose-optimization program which includes automated exposure control, adjustment of the mA and/or kV according to patient size and/or use of iterative reconstruction technique. CONTRAST:  75mL OMNIPAQUE IOHEXOL 350 MG/ML SOLN COMPARISON:  09/23/2020 FINDINGS: Despite efforts by the technologist and patient, motion artifact is present on today's exam and could not  be eliminated. This reduces  exam sensitivity and specificity. Cardiovascular: No filling defect is identified in the pulmonary arterial tree to suggest pulmonary embolus. Coronary, aortic arch, and branch vessel atherosclerotic vascular disease. Mediastinum/Nodes: 1.2 cm right hilar lymph node. 1.0 cm left infrahilar lymph node. 1.2 cm subcarinal lymph node. Small type 1 hiatal hernia.  Mildly dilated distal esophagus. Lungs/Pleura: Consolidation of most of the left lower lobe aside from the superior segment. Substantial airspace opacity posteriorly in the right lower lobe, especially in the posterior basal segment. Appearance suspicious for multilobar pneumonia or aspiration pneumonitis. Patchy reticulonodular opacities are present in both upper lobes along with some bandlike atelectasis at the lung apices. Benign 3 mm calcified granuloma noted in the left upper lobe on image 66 series 6. Upper Abdomen: Abdominal aortic atherosclerosis. Separate origin of the splenic artery from the rest of the celiac trunk; atheromatous calcification at the origins of the upper abdominal aortic branches without appreciable occlusion. Musculoskeletal: Sternoclavicular arthropathy bilaterally. Review of the MIP images confirms the above findings. IMPRESSION: 1. No filling defect is identified in the pulmonary arterial tree to suggest pulmonary embolus. To motion artifact. Reduced sensitivity due 2. Consolidation of most of the left lower lobe aside from the superior segment. Substantial airspace opacity posteriorly in the right lower lobe, especially in the posterior basal segment. Appearance suspicious for multilobar pneumonia or aspiration pneumonitis. 3. Patchy reticulonodular opacities are present in both upper lobes along with some bandlike atelectasis at the lung apices. 4. Mild bilateral hilar and subcarinal adenopathy, probably reactive. 5. Small type 1 hiatal hernia. Mildly dilated distal esophagus. 6. Aortic atherosclerosis. Aortic Atherosclerosis  (ICD10-I70.0). Electronically Signed   By: Gaylyn Rong M.D.   On: 11/30/2022 18:00   CT Renal Stone Study  Result Date: 11/30/2022 CLINICAL DATA:  Abdominal/flank pain, stone suspected.  Weakening EXAM: CT ABDOMEN AND PELVIS WITHOUT CONTRAST TECHNIQUE: Multidetector CT imaging of the abdomen and pelvis was performed following the standard protocol without IV contrast. RADIATION DOSE REDUCTION: This exam was performed according to the departmental dose-optimization program which includes automated exposure control, adjustment of the mA and/or kV according to patient size and/or use of iterative reconstruction technique. COMPARISON:  Chest radiograph performed earlier on the same date. FINDINGS: Lower chest: Bilateral lower lobe consolidations left greater than the right with air bronchograms concerning for bilateral pneumonia. Prominent atherosclerotic calcification of coronary arteries. Hepatobiliary: No focal liver abnormality is seen. No gallstones, gallbladder wall thickening, or biliary dilatation. Pancreas: Generalized pancreatic atrophy no pancreatic ductal dilatation or surrounding inflammatory changes. Spleen: Normal in size without focal abnormality. Adrenals/Urinary Tract: Adrenal glands are unremarkable. Kidneys are normal, without renal calculi, focal lesion, or hydronephrosis. Exophytic bilateral renal cysts which contains mildly complex fluid. The interpolar right renal cyst measures a proximally 2.2 x 2.7 cm and in the lower pole of the left kidney measures a proximally 2.4 x 2.3 cm. Foley's catheter in the urinary bladder with trace amount of air likely iatrogenic. Stomach/Bowel: Stomach is within normal limits. Appendix appears normal. No evidence of bowel wall thickening, distention, or inflammatory changes. Sigmoid colonic diverticulosis without evidence of acute diverticulitis. Vascular/Lymphatic: Aortic atherosclerosis. No enlarged abdominal or pelvic lymph nodes. Reproductive:  Prostate is unremarkable. Other: No abdominal wall hernia or abnormality. No abdominopelvic ascites. 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 No acute osseous abnormality. IMPRESSION: 1. Bilateral lower lobe consolidations left greater than the right with air bronchograms concerning for bilateral pneumonia. 2. No evidence of  nephrolithiasis or hydronephrosis. 3. Sigmoid colonic diverticulosis without evidence of acute diverticulitis. 4. Aortic atherosclerosis and coronary artery calcification. 5. 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 No acute osseous abnormality. Aortic Atherosclerosis (ICD10-I70.0). Electronically Signed   By: Larose Hires D.O.   On: 11/30/2022 16:19   DG Chest Port 1 View  Result Date: 11/30/2022 CLINICAL DATA:  hypoxia EXAM: PORTABLE CHEST - 1 VIEW COMPARISON:  08/08/2022 FINDINGS: Relatively low lung volumes. New consolidation/atelectasis at the left lung base obscuring the diaphragmatic leaflet. Mild scattered perihilar of the lower opacities left greater than right. Heart size and mediastinal contours are within normal limits. No effusion. Visualized bones unremarkable. IMPRESSION: New left lower lobe consolidation/atelectasis. Electronically Signed   By: Corlis Leak M.D.   On: 11/30/2022 14:54         Latest Ref Rng & Units 04/05/2021    9:23 AM 04/17/2018    2:21 PM  PFT Results  FVC-Pre L 3.07  3.02   FVC-Predicted Pre % 91  87   FVC-Post L 3.02  3.20   FVC-Predicted Post % 90  92   Pre FEV1/FVC % % 72  82   Post FEV1/FCV % % 77  77   FEV1-Pre L 2.20  2.49   FEV1-Predicted Pre % 92  100   FEV1-Post L 2.32  2.45   DLCO uncorrected ml/min/mmHg 17.62  17.74   DLCO UNC% % 83  69   DLCO corrected ml/min/mmHg 17.62    DLCO COR %Predicted % 83    DLVA Predicted % 90  86   TLC L  7.59   TLC % Predicted %  125   RV % Predicted %  199     No  results found for: "NITRICOXIDE"      Assessment & Plan:   Acute exacerbation of COPD with asthma (HCC) Recent COPD exacerbation with multifocal pneumonia./MRSA pneumonia.  Patient seems to be clinically improving.  Will need to continue on triple therapy maintenance inhaler.  Patient assistance paperwork was given today.  If unable to get approved we will need to see if nebulized medications will be more affordable option.  Samples were given today.  Plan  Patient Instructions  Doreatha Martin Zyvox as planned.  Continue on Trelegy 1 puff daily, rinse after use. (Samples given) Patient assistance paperwork for Trelegy . If you are not approved then we need to look nebulizer medications) .  Albuterol inhaler As needed   Aspiration precautions.  Use caution with sedating medications.  Chest xray today  Follow up with Dr. Isaiah Serge in 2 weeks and As needed  Please contact office for sooner follow up if symptoms do not improve or worsen or seek emergency care        Multifocal pneumonia MRSA multifocal pneumonia.  Requiring recent recurrent hospitalizations.  Patient is to complete full course of Zyvox.  Clinically seems to be improving.  Chest x-ray today.  Will need to have serial follow-up for clearance  Plan  Patient Instructions  Finish Zyvox as planned.  Continue on Trelegy 1 puff daily, rinse after use. (Samples given) Patient assistance paperwork for Trelegy . If you are not approved then we need to look nebulizer medications) .  Albuterol inhaler As needed   Aspiration precautions.  Use caution with sedating medications.  Chest xray today  Follow up with Dr. Isaiah Serge in 2 weeks and As needed  Please contact office for sooner follow up if symptoms do not improve  or worsen or seek emergency care        Hypoxemia Continue on oxygen to maintain O2 saturations greater than 88 to 90%.  Will check walk test on follow-up visit.     Rubye Oaks, NP 12/20/2022 Small scattered  papular rash along legs and arms

## 2022-12-20 NOTE — Assessment & Plan Note (Signed)
Recent COPD exacerbation with multifocal pneumonia./MRSA pneumonia.  Patient seems to be clinically improving.  Will need to continue on triple therapy maintenance inhaler.  Patient assistance paperwork was given today.  If unable to get approved we will need to see if nebulized medications will be more affordable option.  Samples were given today.  Plan  Patient Instructions  Travis Beck Zyvox as planned.  Continue on Trelegy 1 puff daily, rinse after use. (Samples given) Patient assistance paperwork for Trelegy . If you are not approved then we need to look nebulizer medications) .  Albuterol inhaler As needed   Aspiration precautions.  Use caution with sedating medications.  Chest xray today  Follow up with Dr. Isaiah Serge in 2 weeks and As needed  Please contact office for sooner follow up if symptoms do not improve or worsen or seek emergency care

## 2022-12-20 NOTE — Assessment & Plan Note (Signed)
MRSA multifocal pneumonia.  Requiring recent recurrent hospitalizations.  Patient is to complete full course of Zyvox.  Clinically seems to be improving.  Chest x-ray today.  Will need to have serial follow-up for clearance  Plan  Patient Instructions  Finish Zyvox as planned.  Continue on Trelegy 1 puff daily, rinse after use. (Samples given) Patient assistance paperwork for Trelegy . If you are not approved then we need to look nebulizer medications) .  Albuterol inhaler As needed   Aspiration precautions.  Use caution with sedating medications.  Chest xray today  Follow up with Dr. Isaiah Serge in 2 weeks and As needed  Please contact office for sooner follow up if symptoms do not improve or worsen or seek emergency care

## 2022-12-21 ENCOUNTER — Other Ambulatory Visit (HOSPITAL_COMMUNITY): Payer: Self-pay

## 2022-12-21 NOTE — Telephone Encounter (Signed)
Travis Beck there doesn't seem to be a cheaper alternative. I did give patient trelegy assistance paperwork yesterday. He was going to fill out and bring back to the office.  Just an [place]

## 2022-12-21 NOTE — Telephone Encounter (Signed)
I'm not sure what you're looking for an alternative for, but test claims return the following:   Triple Therapy Breztri  $484.79 Trelegy $487.39  ICS+LABA Breo  $383.09  LAMA Spiriva HH (brand)  $457.27 Spiriva Resp $460.44  LABA+LAMA Stiolto $451.41 Bevespi $406.14  ICS  Arnuity $161.09  All others require PA

## 2022-12-23 DIAGNOSIS — J440 Chronic obstructive pulmonary disease with acute lower respiratory infection: Secondary | ICD-10-CM | POA: Diagnosis not present

## 2022-12-23 DIAGNOSIS — R5381 Other malaise: Secondary | ICD-10-CM | POA: Diagnosis not present

## 2022-12-23 DIAGNOSIS — E119 Type 2 diabetes mellitus without complications: Secondary | ICD-10-CM | POA: Diagnosis not present

## 2022-12-23 DIAGNOSIS — R531 Weakness: Secondary | ICD-10-CM | POA: Diagnosis not present

## 2022-12-23 DIAGNOSIS — I10 Essential (primary) hypertension: Secondary | ICD-10-CM | POA: Diagnosis not present

## 2022-12-23 DIAGNOSIS — J15212 Pneumonia due to Methicillin resistant Staphylococcus aureus: Secondary | ICD-10-CM | POA: Diagnosis not present

## 2022-12-23 DIAGNOSIS — F411 Generalized anxiety disorder: Secondary | ICD-10-CM | POA: Diagnosis not present

## 2022-12-23 DIAGNOSIS — G894 Chronic pain syndrome: Secondary | ICD-10-CM | POA: Diagnosis not present

## 2022-12-25 ENCOUNTER — Telehealth: Payer: Self-pay | Admitting: Adult Health

## 2022-12-25 DIAGNOSIS — F329 Major depressive disorder, single episode, unspecified: Secondary | ICD-10-CM | POA: Diagnosis not present

## 2022-12-25 NOTE — Telephone Encounter (Signed)
Hilda Lias sister would like results of patient's xray. Phone number is [phone]. May leave detailed message on voicemail.

## 2022-12-26 NOTE — Telephone Encounter (Signed)
CXR has not been read by radiologist yet.   Lm for patient's sister, Marie(DPR)

## 2022-12-27 DIAGNOSIS — G894 Chronic pain syndrome: Secondary | ICD-10-CM | POA: Diagnosis not present

## 2022-12-27 DIAGNOSIS — I10 Essential (primary) hypertension: Secondary | ICD-10-CM | POA: Diagnosis not present

## 2022-12-27 DIAGNOSIS — F411 Generalized anxiety disorder: Secondary | ICD-10-CM | POA: Diagnosis not present

## 2022-12-27 DIAGNOSIS — J440 Chronic obstructive pulmonary disease with acute lower respiratory infection: Secondary | ICD-10-CM | POA: Diagnosis not present

## 2022-12-27 DIAGNOSIS — Z299 Encounter for prophylactic measures, unspecified: Secondary | ICD-10-CM | POA: Diagnosis not present

## 2022-12-27 DIAGNOSIS — J15212 Pneumonia due to Methicillin resistant Staphylococcus aureus: Secondary | ICD-10-CM | POA: Diagnosis not present

## 2022-12-27 DIAGNOSIS — E119 Type 2 diabetes mellitus without complications: Secondary | ICD-10-CM | POA: Diagnosis not present

## 2022-12-27 DIAGNOSIS — J189 Pneumonia, unspecified organism: Secondary | ICD-10-CM | POA: Diagnosis not present

## 2022-12-27 DIAGNOSIS — J439 Emphysema, unspecified: Secondary | ICD-10-CM | POA: Diagnosis not present

## 2022-12-27 NOTE — Progress Notes (Signed)
ATC x 1. Mailbox full.  

## 2022-12-29 DIAGNOSIS — J440 Chronic obstructive pulmonary disease with acute lower respiratory infection: Secondary | ICD-10-CM | POA: Diagnosis not present

## 2022-12-29 DIAGNOSIS — J15212 Pneumonia due to Methicillin resistant Staphylococcus aureus: Secondary | ICD-10-CM | POA: Diagnosis not present

## 2022-12-29 DIAGNOSIS — F411 Generalized anxiety disorder: Secondary | ICD-10-CM | POA: Diagnosis not present

## 2022-12-29 DIAGNOSIS — I10 Essential (primary) hypertension: Secondary | ICD-10-CM | POA: Diagnosis not present

## 2022-12-29 DIAGNOSIS — G894 Chronic pain syndrome: Secondary | ICD-10-CM | POA: Diagnosis not present

## 2022-12-29 DIAGNOSIS — E119 Type 2 diabetes mellitus without complications: Secondary | ICD-10-CM | POA: Diagnosis not present

## 2022-12-29 NOTE — Progress Notes (Signed)
Called and spoke with patient, advised of results/recommendations per Tammy Parrett NP.  He verbalized understanding.  Nothing further needed.

## 2022-12-30 ENCOUNTER — Encounter (HOSPITAL_COMMUNITY): Payer: Self-pay | Admitting: Emergency Medicine

## 2022-12-30 ENCOUNTER — Emergency Department (HOSPITAL_COMMUNITY)
Admission: EM | Admit: 2022-12-30 | Discharge: 2022-12-30 | Disposition: A | Discharge status: Home / Self Care | Payer: Medicare Other | Attending: Emergency Medicine | Admitting: Emergency Medicine

## 2022-12-30 ENCOUNTER — Other Ambulatory Visit: Payer: Self-pay

## 2022-12-30 ENCOUNTER — Emergency Department (HOSPITAL_COMMUNITY): Payer: Medicare Other

## 2022-12-30 DIAGNOSIS — R1114 Bilious vomiting: Secondary | ICD-10-CM

## 2022-12-30 DIAGNOSIS — J168 Pneumonia due to other specified infectious organisms: Secondary | ICD-10-CM | POA: Diagnosis not present

## 2022-12-30 DIAGNOSIS — E860 Dehydration: Secondary | ICD-10-CM | POA: Insufficient documentation

## 2022-12-30 DIAGNOSIS — R109 Unspecified abdominal pain: Secondary | ICD-10-CM | POA: Diagnosis not present

## 2022-12-30 DIAGNOSIS — Z7982 Long term (current) use of aspirin: Secondary | ICD-10-CM | POA: Insufficient documentation

## 2022-12-30 DIAGNOSIS — K5730 Diverticulosis of large intestine without perforation or abscess without bleeding: Secondary | ICD-10-CM | POA: Diagnosis not present

## 2022-12-30 DIAGNOSIS — R112 Nausea with vomiting, unspecified: Secondary | ICD-10-CM | POA: Diagnosis not present

## 2022-12-30 DIAGNOSIS — R918 Other nonspecific abnormal finding of lung field: Secondary | ICD-10-CM | POA: Diagnosis not present

## 2022-12-30 LAB — CBC WITH DIFFERENTIAL/PLATELET
Abs Immature Granulocytes: 0.09 10*3/uL — ABNORMAL HIGH (ref 0.00–0.07)
Basophils Absolute: 0.1 10*3/uL (ref 0.0–0.1)
Basophils Relative: 1 %
Eosinophils Absolute: 0.1 10*3/uL (ref 0.0–0.5)
Eosinophils Relative: 1 %
HCT: 27.9 % — ABNORMAL LOW (ref 39.0–52.0)
Hemoglobin: 9.2 g/dL — ABNORMAL LOW (ref 13.0–17.0)
Immature Granulocytes: 1 %
Lymphocytes Relative: 13 %
Lymphs Abs: 1.8 10*3/uL (ref 0.7–4.0)
MCH: 29.2 pg (ref 26.0–34.0)
MCHC: 33 g/dL (ref 30.0–36.0)
MCV: 88.6 fL (ref 80.0–100.0)
Monocytes Absolute: 1.1 10*3/uL — ABNORMAL HIGH (ref 0.1–1.0)
Monocytes Relative: 8 %
Neutro Abs: 10.6 10*3/uL — ABNORMAL HIGH (ref 1.7–7.7)
Neutrophils Relative %: 76 %
Platelets: 248 10*3/uL (ref 150–400)
RBC: 3.15 MIL/uL — ABNORMAL LOW (ref 4.22–5.81)
RDW: 13.4 % (ref 11.5–15.5)
WBC: 13.9 10*3/uL — ABNORMAL HIGH (ref 4.0–10.5)
nRBC: 0 % (ref 0.0–0.2)

## 2022-12-30 LAB — URINALYSIS, ROUTINE W REFLEX MICROSCOPIC
Bilirubin Urine: NEGATIVE
Glucose, UA: 50 mg/dL — AB
Ketones, ur: NEGATIVE mg/dL
Leukocytes,Ua: NEGATIVE
Nitrite: NEGATIVE
Protein, ur: >=300 mg/dL — AB
RBC / HPF: >50 RBC/hpf (ref 0–5)
Specific Gravity, Urine: 1.009 (ref 1.005–1.030)
pH: 6 (ref 5.0–8.0)

## 2022-12-30 LAB — COMPREHENSIVE METABOLIC PANEL
ALT: 12 U/L (ref 0–44)
AST: 15 U/L (ref 15–41)
Albumin: 2.4 g/dL — ABNORMAL LOW (ref 3.5–5.0)
Alkaline Phosphatase: 57 U/L (ref 38–126)
Anion gap: 11 (ref 5–15)
BUN: 29 mg/dL — ABNORMAL HIGH (ref 8–23)
CO2: 24 mmol/L (ref 22–32)
Calcium: 7.9 mg/dL — ABNORMAL LOW (ref 8.9–10.3)
Chloride: 96 mmol/L — ABNORMAL LOW (ref 98–111)
Creatinine, Ser: 1.95 mg/dL — ABNORMAL HIGH (ref 0.61–1.24)
GFR, Estimated: 34 mL/min — ABNORMAL LOW (ref >60)
Glucose, Bld: 143 mg/dL — ABNORMAL HIGH (ref 70–99)
Potassium: 3.6 mmol/L (ref 3.5–5.1)
Sodium: 131 mmol/L — ABNORMAL LOW (ref 135–145)
Total Bilirubin: 0.6 mg/dL (ref 0.3–1.2)
Total Protein: 7 g/dL (ref 6.5–8.1)

## 2022-12-30 LAB — LIPASE, BLOOD: Lipase: 31 U/L (ref 11–51)

## 2022-12-30 MED ORDER — ONDANSETRON 4 MG PO TBDP: 4.0000 mg | ORAL_TABLET | Freq: Three times a day (TID) | ORAL | 0 refills | Status: DC | PRN

## 2022-12-30 MED ORDER — FAMOTIDINE 20 MG PO TABS: 20.0000 mg | ORAL_TABLET | Freq: Two times a day (BID) | ORAL | 0 refills | Status: AC

## 2022-12-30 MED ORDER — SUCRALFATE 1 GM/10ML PO SUSP
1.0000 g | Freq: Once | ORAL | Status: AC
  Administered 2022-12-30: 1 g via ORAL
  Filled 2022-12-30: qty 10

## 2022-12-30 MED ORDER — ONDANSETRON 4 MG PO TBDP: 4.0000 mg | ORAL_TABLET | Freq: Three times a day (TID) | ORAL | 0 refills | Status: AC | PRN

## 2022-12-30 MED ORDER — FAMOTIDINE 20 MG PO TABS: 20.0000 mg | ORAL_TABLET | Freq: Two times a day (BID) | ORAL | 0 refills | Status: DC

## 2022-12-30 MED ORDER — SUCRALFATE 1 G PO TABS: 1.0000 g | ORAL_TABLET | Freq: Three times a day (TID) | ORAL | 0 refills | Status: AC

## 2022-12-30 MED ORDER — SUCRALFATE 1 G PO TABS: 1.0000 g | ORAL_TABLET | Freq: Three times a day (TID) | ORAL | 0 refills | Status: DC

## 2022-12-30 MED ORDER — FAMOTIDINE IN NACL 20-0.9 MG/50ML-% IV SOLN
20.0000 mg | Freq: Once | INTRAVENOUS | Status: AC
  Administered 2022-12-30: 20 mg via INTRAVENOUS
  Filled 2022-12-30: qty 50

## 2022-12-30 MED ORDER — SODIUM CHLORIDE 0.9 % IV SOLN: INTRAVENOUS | Status: DC

## 2022-12-30 MED ORDER — LACTATED RINGERS IV BOLUS
1000.0000 mL | Freq: Once | INTRAVENOUS | Status: AC
  Administered 2022-12-30: 1000 mL via INTRAVENOUS

## 2022-12-30 MED ORDER — ONDANSETRON HCL 4 MG/2ML IJ SOLN
4.0000 mg | Freq: Once | INTRAMUSCULAR | Status: AC
  Administered 2022-12-30: 4 mg via INTRAVENOUS
  Filled 2022-12-30: qty 2

## 2022-12-30 NOTE — ED Notes (Signed)
Pt given crackers and water for PO challenge

## 2022-12-30 NOTE — ED Triage Notes (Signed)
Pt c/o back pain, abdominal pain, N/V x 2 weeks since getting out of the hospital. States he feels like he has pulled something while participating in PT.

## 2022-12-30 NOTE — ED Notes (Signed)
Pt informed of need for urine specimen  

## 2022-12-30 NOTE — ED Notes (Signed)
Pt tolerated PO challenge, no further episodes of N/V at this time

## 2022-12-30 NOTE — Discharge Instructions (Addendum)
As discussed, with your nausea, vomiting it is important to take your medications as prescribed and continue your antibiotics.  Also as we discussed, if your medications do not improve your condition, or you are intolerant of food and liquids, do not hesitate to return here.  Otherwise, on Monday call your physician to discuss today's evaluation, your new medications and appropriate ongoing outpatient care.

## 2022-12-30 NOTE — ED Provider Notes (Signed)
[place] EMERGENCY DEPARTMENT AT [facility] Provider Note   CSN: [id number] Arrival date & time: 12/30/22  7829     History  Chief Complaint  Patient presents with   Nausea    Travis Beck is a 77 y.o. male.  HPI Patient presents with his sister assists with the history.  In essence the patient presents with concern for ongoing nausea, vomiting, p.o. intolerance. Patient was hospitalized, discharged 2 weeks ago after admission for sepsis, pneumonia.  He still has 5 days left of his antibiotic.  He notes that over the past few days in particular, but essentially since discharge she has had ongoing episodes of nausea, vomiting, essentially postprandial, though with persistent anorexia and nausea. Abdominal pain is intermittent, mostly just when he is vomiting, upper abdominal. No chest pain, no new fever.  Patient also notes some ongoing discomfort in the inguinal crease with PT, but has no dysuria, polyuria, scrotal or penis abnormalities or complaints.    Home Medications Prior to Admission medications   Medication Sig Start Date End Date Taking? Authorizing Provider  acetaminophen (TYLENOL) 325 MG tablet Take 2 tablets (650 mg total) by mouth every 6 (six) hours as needed for mild pain (or Fever >/= 101). 11/26/19   Shon Hale, MD  albuterol (VENTOLIN HFA) 108 (90 Base) MCG/ACT inhaler Inhale 2 puffs into the lungs every 4 (four) hours as needed for wheezing or shortness of breath. 11/26/19   Shon Hale, MD  ALPRAZolam Prudy Feeler) 1 MG tablet Take 1 tablet (1 mg total) by mouth 3 (three) times daily as needed for anxiety. 01/28/19   Atha Starks, Ryan, DO  amLODipine (NORVASC) 5 MG tablet Take 5 mg by mouth at bedtime. 11/06/22   [provider]  aspirin EC 81 MG tablet Take 1 tablet (81 mg total) by mouth daily. Swallow whole. 09/30/20   Jonelle Sidle, MD  Budeson-Glycopyrrol-Formoterol (BREZTRI AEROSPHERE) 160-9-4.8 MCG/ACT AERO Inhale 2 puffs into  the lungs in the morning and at bedtime. 12/13/22   Leroy Sea, MD  buprenorphine (SUBUTEX) 8 MG SUBL SL tablet Place 4-8 mg under the tongue See admin instructions. Take 8 mg by mouth in the morning and then take 4 mg by mouth at bedtime. 01/04/19   [provider]  cetirizine (ZYRTEC) 10 MG tablet Take 10 mg by mouth as needed for allergies (itching).    [provider]  fluticasone (FLONASE) 50 MCG/ACT nasal spray Place 2 sprays into both nostrils daily.  08/22/18   [provider]  gabapentin (NEURONTIN) 400 MG capsule Take 1 capsule (400 mg total) by mouth every 8 (eight) hours as needed. Patient taking differently: Take 400 mg by mouth every 8 (eight) hours as needed (pain). 01/28/19   Welborn, Ryan, DO  glipiZIDE (GLUCOTROL XL) 10 MG 24 hr tablet Take 10 mg by mouth daily. 11/06/22   [provider]  guaiFENesin (MUCINEX) 600 MG 12 hr tablet Take 1 tablet (600 mg total) by mouth 2 (two) times daily as needed for cough. 12/13/22   Leroy Sea, MD  linezolid (ZYVOX) 600 MG tablet Take 1 tablet (600 mg total) by mouth every 12 (twelve) hours. 12/13/22   Leroy Sea, MD  metFORMIN (GLUCOPHAGE) 1000 MG tablet Take 1,000 mg by mouth 2 (two) times daily with a meal.  12/07/14   [provider]  montelukast (SINGULAIR) 10 MG tablet Take 1 tablet (10 mg total) by mouth at bedtime. 06/15/22   Chilton Greathouse, MD  pantoprazole (PROTONIX) 40 MG tablet Take 1 tablet (40 mg total) by mouth daily. 12/15/20   Dolores Frame, MD  pramipexole (MIRAPEX) 0.5 MG tablet Take 0.5 mg by mouth at bedtime. 11/04/22   [provider]  pravastatin (PRAVACHOL) 20 MG tablet Take 20 mg by mouth every evening.    [provider]  valsartan (DIOVAN) 160 MG tablet Take 160 mg by mouth daily. 11/06/22   [provider]      Allergies    Penicillins    Review of Systems   Review of Systems  All other systems reviewed and are  negative.   Physical Exam Updated Vital Signs BP (!) 145/87 (BP Location: Right Arm)   Pulse 90   Temp 97.8 F (36.6 C) (Oral)   Resp 18   Ht 5\' 5"  (1.651 m)   Wt 71.7 kg   SpO2 98%   BMI 26.29 kg/m  Physical Exam Vitals and nursing note reviewed.  Constitutional:      General: He is not in acute distress.    Appearance: He is well-developed.  HENT:     Head: Normocephalic and atraumatic.  Eyes:     Conjunctiva/sclera: Conjunctivae normal.  Cardiovascular:     Rate and Rhythm: Normal rate and regular rhythm.  Pulmonary:     Effort: Pulmonary effort is normal. No respiratory distress.     Breath sounds: No stridor.  Abdominal:     General: There is no distension.     Comments: Currently nontender, patient describes pain in the epigastrium episodically.  Skin:    General: Skin is warm and dry.  Neurological:     Mental Status: He is alert and oriented to person, place, and time.     ED Results / Procedures / Treatments   Labs (all labs ordered are listed, but only abnormal results are displayed) Labs Reviewed  COMPREHENSIVE METABOLIC PANEL - Abnormal; Notable for the following components:      Result Value   Sodium 131 (*)    Chloride 96 (*)    Glucose, Bld 143 (*)    BUN 29 (*)    Creatinine, Ser 1.95 (*)    Calcium 7.9 (*)    Albumin 2.4 (*)    GFR, Estimated 34 (*)    All other components within normal limits  CBC WITH DIFFERENTIAL/PLATELET - Abnormal; Notable for the following components:   WBC 13.9 (*)    RBC 3.15 (*)    Hemoglobin 9.2 (*)    HCT 27.9 (*)    Neutro Abs 10.6 (*)    Monocytes Absolute 1.1 (*)    Abs Immature Granulocytes 0.09 (*)    All other components within normal limits  URINALYSIS, ROUTINE W REFLEX MICROSCOPIC - Abnormal; Notable for the following components:   Glucose, UA 50 (*)    Hgb urine dipstick LARGE (*)    Protein, ur >=300 (*)    Bacteria, UA MANY (*)    Crystals PRESENT (*)    All other components within normal  limits  LIPASE, BLOOD    EKG None  Radiology US Abdomen Complete  Result Date: 12/30/2022 CLINICAL DATA:  Abdominal pain EXAM: ABDOMEN ULTRASOUND COMPLETE COMPARISON:  Renal stone CT 11/30/2022 FINDINGS: Gallbladder: No gallstones or wall thickening visualized. No sonographic Murphy sign noted by sonographer. Common bile duct: Diameter: 5 mm Liver: No focal lesion identified. Within normal limits in parenchymal echogenicity. Portal vein is patent on color Doppler imaging with normal direction of blood flow towards  the liver. IVC: No abnormality visualized. Pancreas: Visible main duct but strictly nondilated at 2.5 mm. Spleen: Size and appearance within normal limits. Right Kidney: Length: 13 cm. Simple interpolar cyst measuring up to 4.4 cm. No hydronephrosis. Left Kidney: Length: 11 cm. Simple 3.1 cm cyst. No follow-up imaging is recommended. No hydronephrosis Abdominal aorta: No aneurysm visualized. IMPRESSION: No acute finding or change from abdominal CT earlier this year. Electronically Signed   By: Tiburcio Pea M.D.   On: 12/30/2022 09:58   DG Abd Acute W/Chest  Result Date: 12/30/2022 CLINICAL DATA:  Postprandial emesis. Nausea and vomiting for 2 weeks. EXAM: DG ABDOMEN ACUTE WITH 1 VIEW CHEST COMPARISON:  12/20/2022 FINDINGS: The bowel gas pattern is nonobstructed. No pathologically dilated loops of large or small bowel. Scattered areas of retained barium identified within multiple small colonic diverticula. Heart size and mediastinal contours appear normal. Coarse interstitial markings are identified bilaterally. Persistent retrocardiac opacification within the medial left lung base. IMPRESSION: 1. Nonobstructive bowel gas pattern. 2. Persistent retrocardiac opacification within the medial left lung base which may reflect residual pneumonia or atelectasis. 3. Persistent increased interstitial markings are noted which are favored to represent sequelae of atypical infection or inflammation  Electronically Signed   By: Signa Kell M.D.   On: 12/30/2022 08:51    Procedures Procedures    Medications Ordered in ED Medications  ondansetron (ZOFRAN) injection 4 mg (4 mg Intravenous Given 12/30/22 0808)  sucralfate (CARAFATE) 1 GM/10ML suspension 1 g (1 g Oral Given 12/30/22 0809)  famotidine (PEPCID) IVPB 20 mg premix (0 mg Intravenous Stopped 12/30/22 0842)  lactated ringers bolus 1,000 mL (0 mLs Intravenous Stopped 12/30/22 1140)    ED Course/ Medical Decision Making/ A&P                             Medical Decision Making Adult male presents after recent hospitalization with pneumonia, sepsis now with concern for nausea, vomiting, weakness.  Patient is awake and alert, providing his own history, but given his recent hospitalization, sepsis, brought differential including infection, dehydration, electrolyte abnormalities, intra-abdominal processes/gastritis are considerations. Cardiac 85 sinus normal Pulse ox 100% room air normal   Amount and/or Complexity of Data Reviewed Independent Historian:     Details: Sister at bedside External Data Reviewed: notes.    Details: DC summary included below Labs: ordered. Decision-making details documented in ED Course. Radiology: ordered and independent interpretation performed. Decision-making details documented in ED Course.  Risk Prescription drug management. Decision regarding hospitalization. Diagnosis or treatment significantly limited by social determinants of health.  77 y.o.  male with history of COPD, DM-2, HTN-who was recently hospitalized from 5/23 to 5/26 for septic shock secondary to multilobar pneumonia-requiring vasopressor on admission-stabilized and subsequently discharged home on Augmentin/doxycycline-presented back to the hospital with fever of 103 F and ongoing left-sided pleuritic chest pain.  It appears that his sputum cultures resulted post discharge-and were positive for MRSA.   Significant  events: 5/23-5/26>> hospitalization for septic shock from multilobar PNA. 5/30>> fever 103 F at home-ongoing left-sided pleuritic chest pain-admit to [facility].   Significant studies: 5/23>> CTA chest: No PE, consolidation of most left lower lobe. 5/23>> CT renal stone study: Bilateral lower lobe consolidation left> right, no renal stone/hydronephrosis. 5/30>> CXR: Persistent consolidation left lower lobe-improved aeration of the lung bases. 11:41 AM Patient awake, alert, in no distress, has had liquids and solids tolerated without complication.  He has received fluid resuscitation, Pepcid,  Carafate, Zofran.  Labs reviewed, discussed, ultrasound, x-ray as well.  For hours in the ED and has had no hemodynamic instability, no vomiting, and as above has tolerated oral intake. We discussed admission, given evidence for acute dehydration with elevated BUN, creatinine.  Patient notes that he would like to try his medications at home, try resuscitation there, that we will return should his symptoms return or develop new or concerning changes. Patient will continue his antibiotics as well, and here no evidence for progression of his pneumonia, with no new increased work of breathing, hypoxia or chest pain.        Final Clinical Impression(s) / ED Diagnoses Final diagnoses:  Dehydration  Bilious vomiting with nausea    Rx / DC Orders ED Discharge Orders          Ordered    famotidine (PEPCID) 20 MG tablet  2 times daily,   Status:  Discontinued        12/30/22 1159    sucralfate (CARAFATE) 1 g tablet  3 times daily with meals & bedtime,   Status:  Discontinued       Note to Pharmacy: Take for one week   12/30/22 1159    ondansetron (ZOFRAN-ODT) 4 MG disintegrating tablet  Every 8 hours PRN,   Status:  Discontinued        12/30/22 1159    famotidine (PEPCID) 20 MG tablet  2 times daily        12/30/22 1209    ondansetron (ZOFRAN-ODT) 4 MG disintegrating tablet  Every 8 hours PRN         12/30/22 1209    sucralfate (CARAFATE) 1 g tablet  3 times daily with meals & bedtime       Note to Pharmacy: Take for one week   12/30/22 1209              Gerhard Munch, MD 12/30/22 1210

## 2023-01-02 ENCOUNTER — Telehealth: Payer: Self-pay | Admitting: Adult Health

## 2023-01-02 NOTE — Telephone Encounter (Signed)
This Pt sister was here on the wrong day for her brothers appt. Thought it was 6/25 but it was not until 6/26. The front desk reschd her and she went home and called back to say she needs this Thurs or Monday at about 10:00. Since Ms. Parrett was out today (6/25) Fredric Mare asked me to send a message back to triage and Ms. Parrett to see if she will auth a double Book for this PT.  Please call PT to advise action taken. Sister states he needs to be seen soon because he has been in and out of Emergency care three times.

## 2023-01-03 ENCOUNTER — Ambulatory Visit: Payer: Medicare Other | Admitting: Adult Health

## 2023-01-04 DIAGNOSIS — J440 Chronic obstructive pulmonary disease with acute lower respiratory infection: Secondary | ICD-10-CM | POA: Diagnosis not present

## 2023-01-04 DIAGNOSIS — E119 Type 2 diabetes mellitus without complications: Secondary | ICD-10-CM | POA: Diagnosis not present

## 2023-01-04 DIAGNOSIS — F411 Generalized anxiety disorder: Secondary | ICD-10-CM | POA: Diagnosis not present

## 2023-01-04 DIAGNOSIS — G894 Chronic pain syndrome: Secondary | ICD-10-CM | POA: Diagnosis not present

## 2023-01-04 DIAGNOSIS — J15212 Pneumonia due to Methicillin resistant Staphylococcus aureus: Secondary | ICD-10-CM | POA: Diagnosis not present

## 2023-01-04 DIAGNOSIS — I10 Essential (primary) hypertension: Secondary | ICD-10-CM | POA: Diagnosis not present

## 2023-01-04 NOTE — Telephone Encounter (Signed)
Yes of course try to find a spot next week for me to see him .

## 2023-01-05 NOTE — Telephone Encounter (Signed)
Spoke with pt;s sister Hilda Lias (per [facility]) and scheduled pt for Acute visit with Tammy Parrett on 01/08/23. Nothing further needed at this time.

## 2023-01-06 ENCOUNTER — Inpatient Hospital Stay (HOSPITAL_COMMUNITY)
Admission: EM | Admit: 2023-01-06 | Discharge: 2023-01-25 | DRG: 871 | Disposition: A | Discharge status: Other Acute Inpatient Hospital | Payer: Medicare Other | Attending: Pulmonary Disease | Admitting: Pulmonary Disease

## 2023-01-06 ENCOUNTER — Emergency Department (HOSPITAL_COMMUNITY): Payer: Medicare Other

## 2023-01-06 ENCOUNTER — Encounter (HOSPITAL_COMMUNITY): Payer: Self-pay

## 2023-01-06 ENCOUNTER — Inpatient Hospital Stay (HOSPITAL_COMMUNITY): Payer: Medicare Other

## 2023-01-06 DIAGNOSIS — R1310 Dysphagia, unspecified: Secondary | ICD-10-CM | POA: Diagnosis not present

## 2023-01-06 DIAGNOSIS — J9601 Acute respiratory failure with hypoxia: Secondary | ICD-10-CM | POA: Diagnosis present

## 2023-01-06 DIAGNOSIS — N281 Cyst of kidney, acquired: Secondary | ICD-10-CM | POA: Diagnosis not present

## 2023-01-06 DIAGNOSIS — N184 Chronic kidney disease, stage 4 (severe): Secondary | ICD-10-CM | POA: Diagnosis present

## 2023-01-06 DIAGNOSIS — R933 Abnormal findings on diagnostic imaging of other parts of digestive tract: Secondary | ICD-10-CM

## 2023-01-06 DIAGNOSIS — Z992 Dependence on renal dialysis: Secondary | ICD-10-CM | POA: Diagnosis not present

## 2023-01-06 DIAGNOSIS — J441 Chronic obstructive pulmonary disease with (acute) exacerbation: Secondary | ICD-10-CM | POA: Diagnosis not present

## 2023-01-06 DIAGNOSIS — K222 Esophageal obstruction: Secondary | ICD-10-CM | POA: Diagnosis present

## 2023-01-06 DIAGNOSIS — Z781 Physical restraint status: Secondary | ICD-10-CM

## 2023-01-06 DIAGNOSIS — J690 Pneumonitis due to inhalation of food and vomit: Secondary | ICD-10-CM | POA: Diagnosis present

## 2023-01-06 DIAGNOSIS — J4489 Other specified chronic obstructive pulmonary disease: Secondary | ICD-10-CM | POA: Diagnosis not present

## 2023-01-06 DIAGNOSIS — J45901 Unspecified asthma with (acute) exacerbation: Secondary | ICD-10-CM | POA: Diagnosis present

## 2023-01-06 DIAGNOSIS — J984 Other disorders of lung: Secondary | ICD-10-CM | POA: Diagnosis not present

## 2023-01-06 DIAGNOSIS — E1143 Type 2 diabetes mellitus with diabetic autonomic (poly)neuropathy: Secondary | ICD-10-CM | POA: Diagnosis present

## 2023-01-06 DIAGNOSIS — Z6825 Body mass index (BMI) 25.0-25.9, adult: Secondary | ICD-10-CM

## 2023-01-06 DIAGNOSIS — J449 Chronic obstructive pulmonary disease, unspecified: Secondary | ICD-10-CM | POA: Diagnosis not present

## 2023-01-06 DIAGNOSIS — B3781 Candidal esophagitis: Secondary | ICD-10-CM | POA: Diagnosis present

## 2023-01-06 DIAGNOSIS — N170 Acute kidney failure with tubular necrosis: Secondary | ICD-10-CM | POA: Diagnosis not present

## 2023-01-06 DIAGNOSIS — R739 Hyperglycemia, unspecified: Secondary | ICD-10-CM | POA: Diagnosis not present

## 2023-01-06 DIAGNOSIS — F32A Depression, unspecified: Secondary | ICD-10-CM | POA: Diagnosis present

## 2023-01-06 DIAGNOSIS — E440 Moderate protein-calorie malnutrition: Secondary | ICD-10-CM | POA: Diagnosis not present

## 2023-01-06 DIAGNOSIS — D631 Anemia in chronic kidney disease: Secondary | ICD-10-CM | POA: Diagnosis present

## 2023-01-06 DIAGNOSIS — F05 Delirium due to known physiological condition: Secondary | ICD-10-CM | POA: Diagnosis not present

## 2023-01-06 DIAGNOSIS — N179 Acute kidney failure, unspecified: Secondary | ICD-10-CM | POA: Diagnosis not present

## 2023-01-06 DIAGNOSIS — R918 Other nonspecific abnormal finding of lung field: Secondary | ICD-10-CM | POA: Diagnosis not present

## 2023-01-06 DIAGNOSIS — R1319 Other dysphagia: Secondary | ICD-10-CM

## 2023-01-06 DIAGNOSIS — E1165 Type 2 diabetes mellitus with hyperglycemia: Secondary | ICD-10-CM | POA: Diagnosis present

## 2023-01-06 DIAGNOSIS — A419 Sepsis, unspecified organism: Principal | ICD-10-CM | POA: Diagnosis present

## 2023-01-06 DIAGNOSIS — E874 Mixed disorder of acid-base balance: Secondary | ICD-10-CM | POA: Diagnosis not present

## 2023-01-06 DIAGNOSIS — Z515 Encounter for palliative care: Secondary | ICD-10-CM | POA: Diagnosis not present

## 2023-01-06 DIAGNOSIS — R6520 Severe sepsis without septic shock: Secondary | ICD-10-CM | POA: Diagnosis not present

## 2023-01-06 DIAGNOSIS — R0603 Acute respiratory distress: Secondary | ICD-10-CM | POA: Diagnosis not present

## 2023-01-06 DIAGNOSIS — K219 Gastro-esophageal reflux disease without esophagitis: Secondary | ICD-10-CM | POA: Diagnosis not present

## 2023-01-06 DIAGNOSIS — R6521 Severe sepsis with septic shock: Secondary | ICD-10-CM | POA: Diagnosis not present

## 2023-01-06 DIAGNOSIS — Z4682 Encounter for fitting and adjustment of non-vascular catheter: Secondary | ICD-10-CM | POA: Diagnosis not present

## 2023-01-06 DIAGNOSIS — E43 Unspecified severe protein-calorie malnutrition: Secondary | ICD-10-CM | POA: Diagnosis present

## 2023-01-06 DIAGNOSIS — E1122 Type 2 diabetes mellitus with diabetic chronic kidney disease: Secondary | ICD-10-CM | POA: Diagnosis present

## 2023-01-06 DIAGNOSIS — J189 Pneumonia, unspecified organism: Secondary | ICD-10-CM | POA: Diagnosis not present

## 2023-01-06 DIAGNOSIS — R627 Adult failure to thrive: Secondary | ICD-10-CM | POA: Diagnosis present

## 2023-01-06 DIAGNOSIS — Z79899 Other long term (current) drug therapy: Secondary | ICD-10-CM

## 2023-01-06 DIAGNOSIS — G2581 Restless legs syndrome: Secondary | ICD-10-CM | POA: Diagnosis present

## 2023-01-06 DIAGNOSIS — R0602 Shortness of breath: Secondary | ICD-10-CM | POA: Diagnosis not present

## 2023-01-06 DIAGNOSIS — G9341 Metabolic encephalopathy: Secondary | ICD-10-CM | POA: Diagnosis not present

## 2023-01-06 DIAGNOSIS — E11649 Type 2 diabetes mellitus with hypoglycemia without coma: Secondary | ICD-10-CM | POA: Diagnosis not present

## 2023-01-06 DIAGNOSIS — J9690 Respiratory failure, unspecified, unspecified whether with hypoxia or hypercapnia: Secondary | ICD-10-CM | POA: Diagnosis not present

## 2023-01-06 DIAGNOSIS — Z7984 Long term (current) use of oral hypoglycemic drugs: Secondary | ICD-10-CM

## 2023-01-06 DIAGNOSIS — E871 Hypo-osmolality and hyponatremia: Secondary | ICD-10-CM | POA: Diagnosis not present

## 2023-01-06 DIAGNOSIS — Z794 Long term (current) use of insulin: Secondary | ICD-10-CM

## 2023-01-06 DIAGNOSIS — K224 Dyskinesia of esophagus: Secondary | ICD-10-CM | POA: Diagnosis present

## 2023-01-06 DIAGNOSIS — J90 Pleural effusion, not elsewhere classified: Secondary | ICD-10-CM | POA: Diagnosis not present

## 2023-01-06 DIAGNOSIS — Z87891 Personal history of nicotine dependence: Secondary | ICD-10-CM | POA: Diagnosis not present

## 2023-01-06 DIAGNOSIS — E222 Syndrome of inappropriate secretion of antidiuretic hormone: Secondary | ICD-10-CM | POA: Diagnosis not present

## 2023-01-06 DIAGNOSIS — J439 Emphysema, unspecified: Secondary | ICD-10-CM | POA: Diagnosis present

## 2023-01-06 DIAGNOSIS — R059 Cough, unspecified: Secondary | ICD-10-CM | POA: Diagnosis not present

## 2023-01-06 DIAGNOSIS — E8809 Other disorders of plasma-protein metabolism, not elsewhere classified: Secondary | ICD-10-CM | POA: Diagnosis present

## 2023-01-06 DIAGNOSIS — Z1152 Encounter for screening for COVID-19: Secondary | ICD-10-CM | POA: Diagnosis not present

## 2023-01-06 DIAGNOSIS — Z8719 Personal history of other diseases of the digestive system: Secondary | ICD-10-CM | POA: Diagnosis not present

## 2023-01-06 DIAGNOSIS — D649 Anemia, unspecified: Secondary | ICD-10-CM | POA: Diagnosis not present

## 2023-01-06 DIAGNOSIS — Z880 Allergy status to penicillin: Secondary | ICD-10-CM

## 2023-01-06 DIAGNOSIS — K3184 Gastroparesis: Secondary | ICD-10-CM | POA: Diagnosis present

## 2023-01-06 DIAGNOSIS — E869 Volume depletion, unspecified: Secondary | ICD-10-CM | POA: Diagnosis present

## 2023-01-06 DIAGNOSIS — T380X5A Adverse effect of glucocorticoids and synthetic analogues, initial encounter: Secondary | ICD-10-CM | POA: Diagnosis present

## 2023-01-06 DIAGNOSIS — R1314 Dysphagia, pharyngoesophageal phase: Secondary | ICD-10-CM | POA: Diagnosis present

## 2023-01-06 DIAGNOSIS — D509 Iron deficiency anemia, unspecified: Secondary | ICD-10-CM | POA: Diagnosis present

## 2023-01-06 DIAGNOSIS — D75839 Thrombocytosis, unspecified: Secondary | ICD-10-CM | POA: Diagnosis not present

## 2023-01-06 DIAGNOSIS — A4102 Sepsis due to Methicillin resistant Staphylococcus aureus: Secondary | ICD-10-CM

## 2023-01-06 DIAGNOSIS — K5909 Other constipation: Secondary | ICD-10-CM | POA: Diagnosis present

## 2023-01-06 DIAGNOSIS — Z452 Encounter for adjustment and management of vascular access device: Secondary | ICD-10-CM | POA: Diagnosis not present

## 2023-01-06 DIAGNOSIS — I10 Essential (primary) hypertension: Secondary | ICD-10-CM | POA: Diagnosis not present

## 2023-01-06 DIAGNOSIS — G8929 Other chronic pain: Secondary | ICD-10-CM | POA: Diagnosis present

## 2023-01-06 DIAGNOSIS — R0902 Hypoxemia: Secondary | ICD-10-CM | POA: Diagnosis not present

## 2023-01-06 DIAGNOSIS — I129 Hypertensive chronic kidney disease with stage 1 through stage 4 chronic kidney disease, or unspecified chronic kidney disease: Secondary | ICD-10-CM | POA: Diagnosis not present

## 2023-01-06 DIAGNOSIS — E8770 Fluid overload, unspecified: Secondary | ICD-10-CM | POA: Diagnosis not present

## 2023-01-06 DIAGNOSIS — R531 Weakness: Secondary | ICD-10-CM | POA: Diagnosis not present

## 2023-01-06 DIAGNOSIS — F419 Anxiety disorder, unspecified: Secondary | ICD-10-CM | POA: Diagnosis present

## 2023-01-06 DIAGNOSIS — E785 Hyperlipidemia, unspecified: Secondary | ICD-10-CM | POA: Diagnosis present

## 2023-01-06 DIAGNOSIS — R112 Nausea with vomiting, unspecified: Secondary | ICD-10-CM | POA: Diagnosis not present

## 2023-01-06 DIAGNOSIS — R634 Abnormal weight loss: Secondary | ICD-10-CM | POA: Diagnosis present

## 2023-01-06 LAB — CBC WITH DIFFERENTIAL/PLATELET
Abs Immature Granulocytes: 0.24 10*3/uL — ABNORMAL HIGH (ref 0.00–0.07)
Basophils Absolute: 0.1 10*3/uL (ref 0.0–0.1)
Basophils Relative: 0 %
Eosinophils Absolute: 0.1 10*3/uL (ref 0.0–0.5)
Eosinophils Relative: 0 %
HCT: 26.6 % — ABNORMAL LOW (ref 39.0–52.0)
Hemoglobin: 8.7 g/dL — ABNORMAL LOW (ref 13.0–17.0)
Immature Granulocytes: 1 %
Lymphocytes Relative: 5 %
Lymphs Abs: 1 10*3/uL (ref 0.7–4.0)
MCH: 28.8 pg (ref 26.0–34.0)
MCHC: 32.7 g/dL (ref 30.0–36.0)
MCV: 88.1 fL (ref 80.0–100.0)
Monocytes Absolute: 0.7 10*3/uL (ref 0.1–1.0)
Monocytes Relative: 4 %
Neutro Abs: 18.1 10*3/uL — ABNORMAL HIGH (ref 1.7–7.7)
Neutrophils Relative %: 90 %
Platelets: 587 10*3/uL — ABNORMAL HIGH (ref 150–400)
RBC: 3.02 MIL/uL — ABNORMAL LOW (ref 4.22–5.81)
RDW: 14 % (ref 11.5–15.5)
WBC: 20.2 10*3/uL — ABNORMAL HIGH (ref 4.0–10.5)
nRBC: 0 % (ref 0.0–0.2)

## 2023-01-06 LAB — URINALYSIS, W/ REFLEX TO CULTURE (INFECTION SUSPECTED)
Bilirubin Urine: NEGATIVE
Glucose, UA: 50 mg/dL — AB
Ketones, ur: NEGATIVE mg/dL
Nitrite: NEGATIVE
Protein, ur: 100 mg/dL — AB
RBC / HPF: >50 RBC/hpf (ref 0–5)
Specific Gravity, Urine: 1.015 (ref 1.005–1.030)
pH: 5 (ref 5.0–8.0)

## 2023-01-06 LAB — BASIC METABOLIC PANEL
Anion gap: 16 — ABNORMAL HIGH (ref 5–15)
Anion gap: 16 — ABNORMAL HIGH (ref 5–15)
BUN: 66 mg/dL — ABNORMAL HIGH (ref 8–23)
BUN: 65 mg/dL — ABNORMAL HIGH (ref 8–23)
CO2: 19 mmol/L — ABNORMAL LOW (ref 22–32)
CO2: 18 mmol/L — ABNORMAL LOW (ref 22–32)
Calcium: 7.8 mg/dL — ABNORMAL LOW (ref 8.9–10.3)
Calcium: 7.7 mg/dL — ABNORMAL LOW (ref 8.9–10.3)
Chloride: 93 mmol/L — ABNORMAL LOW (ref 98–111)
Chloride: 94 mmol/L — ABNORMAL LOW (ref 98–111)
Creatinine, Ser: 4.2 mg/dL — ABNORMAL HIGH (ref 0.61–1.24)
Creatinine, Ser: 3.9 mg/dL — ABNORMAL HIGH (ref 0.61–1.24)
GFR, Estimated: 14 mL/min — ABNORMAL LOW (ref >60)
GFR, Estimated: 15 mL/min — ABNORMAL LOW (ref >60)
Glucose, Bld: 190 mg/dL — ABNORMAL HIGH (ref 70–99)
Glucose, Bld: 130 mg/dL — ABNORMAL HIGH (ref 70–99)
Potassium: 3.9 mmol/L (ref 3.5–5.1)
Potassium: 3.8 mmol/L (ref 3.5–5.1)
Sodium: 128 mmol/L — ABNORMAL LOW (ref 135–145)
Sodium: 128 mmol/L — ABNORMAL LOW (ref 135–145)

## 2023-01-06 LAB — APTT: aPTT: 44 seconds — ABNORMAL HIGH (ref 24–36)

## 2023-01-06 LAB — LACTIC ACID, PLASMA
Lactic Acid, Venous: 2.7 mmol/L (ref 0.5–1.9)
Lactic Acid, Venous: 3.7 mmol/L (ref 0.5–1.9)
Lactic Acid, Venous: 2.8 mmol/L (ref 0.5–1.9)

## 2023-01-06 LAB — PROTIME-INR
INR: 1.2 (ref 0.8–1.2)
Prothrombin Time: 15.5 seconds — ABNORMAL HIGH (ref 11.4–15.2)

## 2023-01-06 LAB — RESP PANEL BY RT-PCR (RSV, FLU A&B, COVID)  RVPGX2
Influenza A by PCR: NEGATIVE
Influenza B by PCR: NEGATIVE
Resp Syncytial Virus by PCR: NEGATIVE
SARS Coronavirus 2 by RT PCR: NEGATIVE

## 2023-01-06 LAB — URINE CULTURE

## 2023-01-06 LAB — GLUCOSE, CAPILLARY: Glucose-Capillary: 141 mg/dL — ABNORMAL HIGH (ref 70–99)

## 2023-01-06 MED ORDER — BUPRENORPHINE HCL 2 MG SL SUBL
4.0000 mg | SUBLINGUAL_TABLET | Freq: Every day | SUBLINGUAL | Status: DC
  Administered 2023-01-06 – 2023-01-07 (×2): 4 mg via SUBLINGUAL
  Filled 2023-01-06 (×2): qty 2

## 2023-01-06 MED ORDER — BUPRENORPHINE HCL 2 MG SL SUBL: 4.0000 mg | SUBLINGUAL_TABLET | SUBLINGUAL | Status: DC

## 2023-01-06 MED ORDER — FLUTICASONE PROPIONATE 50 MCG/ACT NA SUSP
2.0000 | Freq: Every day | NASAL | Status: DC
  Administered 2023-01-07 – 2023-01-25 (×15): 2 via NASAL
  Filled 2023-01-06 (×4): qty 16

## 2023-01-06 MED ORDER — MELATONIN 5 MG PO TABS
5.0000 mg | ORAL_TABLET | Freq: Once | ORAL | Status: AC
  Administered 2023-01-07: 5 mg via ORAL
  Filled 2023-01-06: qty 1

## 2023-01-06 MED ORDER — ACETAMINOPHEN 500 MG PO TABS
1000.0000 mg | ORAL_TABLET | Freq: Once | ORAL | Status: AC
  Administered 2023-01-06: 1000 mg via ORAL
  Filled 2023-01-06: qty 2

## 2023-01-06 MED ORDER — VANCOMYCIN HCL 1500 MG/300ML IV SOLN
1500.0000 mg | Freq: Once | INTRAVENOUS | Status: AC
  Administered 2023-01-06: 1500 mg via INTRAVENOUS
  Filled 2023-01-06 (×2): qty 300

## 2023-01-06 MED ORDER — PRAVASTATIN SODIUM 10 MG PO TABS
20.0000 mg | ORAL_TABLET | Freq: Every evening | ORAL | Status: DC
  Administered 2023-01-06 – 2023-01-07 (×2): 20 mg via ORAL
  Filled 2023-01-06 (×3): qty 2

## 2023-01-06 MED ORDER — LORATADINE 10 MG PO TABS
10.0000 mg | ORAL_TABLET | Freq: Every day | ORAL | Status: DC
  Administered 2023-01-06 – 2023-01-08 (×3): 10 mg via ORAL
  Filled 2023-01-06 (×3): qty 1

## 2023-01-06 MED ORDER — METOCLOPRAMIDE HCL 5 MG/ML IJ SOLN
10.0000 mg | Freq: Four times a day (QID) | INTRAMUSCULAR | Status: DC
  Administered 2023-01-06 – 2023-01-09 (×11): 10 mg via INTRAVENOUS
  Filled 2023-01-06 (×11): qty 2

## 2023-01-06 MED ORDER — SODIUM CHLORIDE 0.9 % IV SOLN: 2.0000 g | Freq: Once | INTRAVENOUS | Status: DC

## 2023-01-06 MED ORDER — LACTATED RINGERS IV BOLUS (SEPSIS)
1000.0000 mL | Freq: Once | INTRAVENOUS | Status: AC
  Administered 2023-01-06: 1000 mL via INTRAVENOUS

## 2023-01-06 MED ORDER — HEPARIN SODIUM (PORCINE) 5000 UNIT/ML IJ SOLN
5000.0000 [IU] | Freq: Two times a day (BID) | INTRAMUSCULAR | Status: DC
  Administered 2023-01-06 – 2023-01-07 (×3): 5000 [IU] via SUBCUTANEOUS
  Filled 2023-01-06 (×3): qty 1

## 2023-01-06 MED ORDER — PRAMIPEXOLE DIHYDROCHLORIDE 0.25 MG PO TABS
0.5000 mg | ORAL_TABLET | Freq: Every day | ORAL | Status: DC
  Administered 2023-01-06 – 2023-01-09 (×4): 0.5 mg via ORAL
  Filled 2023-01-06 (×5): qty 2

## 2023-01-06 MED ORDER — INSULIN ASPART 100 UNIT/ML IJ SOLN
0.0000 [IU] | Freq: Three times a day (TID) | INTRAMUSCULAR | Status: DC
  Administered 2023-01-06: 1 [IU] via SUBCUTANEOUS
  Administered 2023-01-07 – 2023-01-08 (×3): 2 [IU] via SUBCUTANEOUS
  Administered 2023-01-08 – 2023-01-09 (×2): 1 [IU] via SUBCUTANEOUS
  Administered 2023-01-09 (×2): 2 [IU] via SUBCUTANEOUS

## 2023-01-06 MED ORDER — LACTATED RINGERS IV SOLN: INTRAVENOUS | Status: AC

## 2023-01-06 MED ORDER — ACETAMINOPHEN 325 MG PO TABS
650.0000 mg | ORAL_TABLET | Freq: Four times a day (QID) | ORAL | Status: DC | PRN
  Administered 2023-01-07: 650 mg via ORAL
  Filled 2023-01-06: qty 2

## 2023-01-06 MED ORDER — ALPRAZOLAM 0.25 MG PO TABS: 1.0000 mg | ORAL_TABLET | Freq: Three times a day (TID) | ORAL | Status: DC | PRN

## 2023-01-06 MED ORDER — VANCOMYCIN HCL IN DEXTROSE 1-5 GM/200ML-% IV SOLN: 1000.0000 mg | Freq: Once | INTRAVENOUS | Status: DC

## 2023-01-06 MED ORDER — SODIUM CHLORIDE 0.9 % IV SOLN
500.0000 mg | Freq: Once | INTRAVENOUS | Status: AC
  Administered 2023-01-06: 500 mg via INTRAVENOUS
  Filled 2023-01-06: qty 5

## 2023-01-06 MED ORDER — BUPRENORPHINE HCL 8 MG SL SUBL
8.0000 mg | SUBLINGUAL_TABLET | Freq: Every day | SUBLINGUAL | Status: DC
  Administered 2023-01-07 – 2023-01-08 (×2): 8 mg via SUBLINGUAL
  Filled 2023-01-06 (×3): qty 1

## 2023-01-06 MED ORDER — LACTATED RINGERS IV BOLUS (SEPSIS)
250.0000 mL | Freq: Once | INTRAVENOUS | Status: AC
  Administered 2023-01-06: 250 mL via INTRAVENOUS

## 2023-01-06 MED ORDER — ALBUTEROL SULFATE (2.5 MG/3ML) 0.083% IN NEBU
2.5000 mg | INHALATION_SOLUTION | RESPIRATORY_TRACT | Status: DC | PRN
  Administered 2023-01-07 – 2023-01-08 (×3): 2.5 mg via RESPIRATORY_TRACT
  Filled 2023-01-06 (×3): qty 3

## 2023-01-06 MED ORDER — FLUTICASONE FUROATE-VILANTEROL 200-25 MCG/ACT IN AEPB
1.0000 | INHALATION_SPRAY | Freq: Every day | RESPIRATORY_TRACT | Status: DC
  Administered 2023-01-07: 1 via RESPIRATORY_TRACT
  Filled 2023-01-06: qty 28

## 2023-01-06 MED ORDER — MONTELUKAST SODIUM 10 MG PO TABS
10.0000 mg | ORAL_TABLET | Freq: Every day | ORAL | Status: DC
  Administered 2023-01-06 – 2023-01-07 (×2): 10 mg via ORAL
  Filled 2023-01-06 (×2): qty 1

## 2023-01-06 MED ORDER — LINEZOLID 600 MG PO TABS: 600.0000 mg | ORAL_TABLET | Freq: Two times a day (BID) | ORAL | Status: DC

## 2023-01-06 MED ORDER — LACTATED RINGERS IV BOLUS
250.0000 mL | Freq: Once | INTRAVENOUS | Status: AC
  Administered 2023-01-07: 250 mL via INTRAVENOUS

## 2023-01-06 MED ORDER — ASPIRIN 81 MG PO TBEC
81.0000 mg | DELAYED_RELEASE_TABLET | Freq: Every day | ORAL | Status: DC
  Administered 2023-01-07 – 2023-01-08 (×2): 81 mg via ORAL
  Filled 2023-01-06 (×2): qty 1

## 2023-01-06 MED ORDER — SODIUM CHLORIDE 0.9 % IV SOLN: INTRAVENOUS | Status: AC

## 2023-01-06 MED ORDER — SODIUM CHLORIDE 0.9 % IV SOLN
2.0000 g | INTRAVENOUS | Status: DC
  Administered 2023-01-06 – 2023-01-11 (×6): 2 g via INTRAVENOUS
  Filled 2023-01-06 (×6): qty 12.5

## 2023-01-06 MED ORDER — PANTOPRAZOLE SODIUM 40 MG PO TBEC
40.0000 mg | DELAYED_RELEASE_TABLET | Freq: Every day | ORAL | Status: DC
  Administered 2023-01-06 – 2023-01-08 (×3): 40 mg via ORAL
  Filled 2023-01-06 (×3): qty 1

## 2023-01-06 MED ORDER — FAMOTIDINE 20 MG PO TABS: 20.0000 mg | ORAL_TABLET | Freq: Two times a day (BID) | ORAL | Status: DC

## 2023-01-06 MED ORDER — FAMOTIDINE 20 MG PO TABS
20.0000 mg | ORAL_TABLET | Freq: Every day | ORAL | Status: DC
  Administered 2023-01-07 – 2023-01-08 (×2): 20 mg via ORAL
  Filled 2023-01-06 (×3): qty 1

## 2023-01-06 MED ORDER — GABAPENTIN 100 MG PO CAPS
100.0000 mg | ORAL_CAPSULE | Freq: Three times a day (TID) | ORAL | Status: DC | PRN
  Administered 2023-01-07 (×2): 100 mg via ORAL
  Filled 2023-01-06 (×2): qty 1

## 2023-01-06 MED ORDER — VANCOMYCIN VARIABLE DOSE PER UNSTABLE RENAL FUNCTION (PHARMACIST DOSING): Status: DC

## 2023-01-06 MED ORDER — GUAIFENESIN ER 600 MG PO TB12: 600.0000 mg | ORAL_TABLET | Freq: Two times a day (BID) | ORAL | Status: DC | PRN

## 2023-01-06 MED ORDER — SUCRALFATE 1 G PO TABS: 1.0000 g | ORAL_TABLET | Freq: Three times a day (TID) | ORAL | Status: DC

## 2023-01-06 NOTE — Sepsis Progress Note (Addendum)
Elink following code sepsis  1702 ED bedside RN contacted following up on ETA for 2nd lactic, bedside RN will obtain when 1st liter bolus is complete  1837 pt transferred to floor second lactic still not drawn, message sent to floor bedside RN asking for lab to be contacted to come draw lab

## 2023-01-06 NOTE — ED Triage Notes (Signed)
Pt c/o R flank/mid back pain x3 days.  Pain score 6/10.  Denies SOB, cough, and n/v/d.  Pt has appointment w/ Pulmonology on Monday.  Pt was recently admitted to [facility] w/ PNA.  Pt reports finishing course of antibiotics.    Pt reports regularly using incentive spirometer.

## 2023-01-06 NOTE — Progress Notes (Signed)
Pharmacy Antibiotic Note  Travis Beck is a 77 y.o. male admitted on 01/06/2023 with pneumonia.  Pharmacy has been consulted for Cefepime and Vancomycin dosing.  WBC 20.2, afebrile SCr 4.20 (baseline SCr ~0.7) MRSA PCR positive on 11/30/22 and 12/07/22 Patient recently took linezolid 600mg  po BID x 7 days from 12/13/22>12/19/22  Plan: Initiate Cefepime 2g IV q24h Initiate loading dose of Vancomycin 1500mg  IV x 1, followed by  Vancomycin variable dosing per unstable renal function Continue Azithromycin per MD Monitor daily CBC, temp, SCr, and for clinical signs of improvement  F/u cultures and de-escalate antibiotics as able   Height: 5\' 5"  (165.1 cm) Weight: 71.7 kg (158 lb) IBW/kg (Calculated) : 61.5  Temp (24hrs), Avg:98 F (36.7 C), Min:98 F (36.7 C), Max:98 F (36.7 C)  Recent Labs  Lab 01/06/23 1332  WBC 20.2*  CREATININE 4.20*    Estimated Creatinine Clearance: 12.4 mL/min (A) (by C-G formula based on SCr of 4.2 mg/dL (H)).    Allergies  Allergen Reactions   Penicillins Rash    No problems with ampicillin during hospitalization    Antimicrobials this admission: Cefepime 6/29 >>  Azithromycin 6/29 >>  Vancomycin 6/29 >>   Dose adjustments this admission: N/A  Microbiology results: 6/29 BCx: sent   Thank you for allowing pharmacy to be a part of this patient's care.  Wilburn Cornelia, PharmD, BCPS Clinical Pharmacist 01/06/2023 3:27 PM   Please refer to AMION for pharmacy phone number

## 2023-01-06 NOTE — Sepsis Progress Note (Signed)
Elink following for sepsis protocol. 

## 2023-01-06 NOTE — H&P (Signed)
History and Physical    Travis Beck ZOX:[id number] DOB: Mar 28, 1946 DOA: 01/06/2023  PCP: Kirstie Peri, MD (Confirm with patient/family/NH records and if not entered, this has to be entered at [facility] point of entry) Patient coming from: home  I have personally briefly reviewed patient's old medical records in [facility]  Chief Complaint: N/V, cough  HPI: Travis Beck is a 77 y.o. male with medical history significant of IIDM, HTN, SIDAH, COPD, diabetic neuropathy, recurrent pneumonia presented with worsening of nauseous vomiting cough and shortness of breath.  Patient was recently hospitalized for multifocal pneumonia and septic shock MRSA pneumonia and discharged on Zyvox p.o.  He was discharged 3 weeks ago home, initially his breathing symptoms improved with Debrox however patient continued to experience frequent nauseous vomiting and cough.  He described that he does not feel food or water stuck in the chest but 10 to 20 minutes after he ate or drink he started to feel nauseous and throwing up undigested food and throwing up, lasted for few hours and happen at night with clear liquid.  Last Friday, patient went to [facility] ED, with nauseous vomiting and cough, after giving IV fluid and Zofran symptoms improved and patient discharged home with Zofran as needed, and GI cocktail.  Over the next 2 days his symptoms were better however last few days nauseous vomiting and cough became worse.  ED Course: Temperature 99.6, down tachycardia nonhypotensive.  Saturation 93% on 3 L.  Chest x-ray showed multifocal pneumonia on the right side.  Sodium 128, creatinine 4.2, BUN 66, WBC 20, hemoglobin 8.7.   Review of Systems: As per HPI otherwise 14 point review of systems negative.   Past Medical History:  Diagnosis Date   Arthritis    Asthma    Childhood   Emphysema lung (HCC)    Essential hypertension    Ischemic heart disease    Myoview 2020 indicating inferior  infarct scar with mild peri-infarct ischemia - managed medically   Type 2 diabetes mellitus (HCC)     Past Surgical History:  Procedure Laterality Date   BIOPSY  08/30/2018   Procedure: BIOPSY;  Surgeon: Malissa Hippo, MD;  [facility];  Service: Endoscopy;;  gastric    BIOPSY  11/05/2020   Procedure: BIOPSY;  Surgeon: Dolores Frame, MD;  [facility];  Service: Gastroenterology;;   COLONOSCOPY     COLONOSCOPY WITH PROPOFOL N/A 11/05/2020   Procedure: COLONOSCOPY WITH PROPOFOL;  Surgeon: Dolores Frame, MD;  [facility];  Service: Gastroenterology;  Laterality: N/A;  Am   ESOPHAGEAL DILATION N/A 05/21/2019   Procedure: ESOPHAGEAL DILATION;  Surgeon: Malissa Hippo, MD;  [facility];  Service: Endoscopy;  Laterality: N/A;   ESOPHAGOGASTRODUODENOSCOPY N/A 05/21/2019   Procedure: ESOPHAGOGASTRODUODENOSCOPY (EGD);  Surgeon: Malissa Hippo, MD;  [facility];  Service: Endoscopy;  Laterality: N/A;  730   ESOPHAGOGASTRODUODENOSCOPY (EGD) WITH PROPOFOL N/A 08/30/2018   Procedure: ESOPHAGOGASTRODUODENOSCOPY (EGD) WITH PROPOFOL;  Surgeon: Malissa Hippo, MD;  [facility];  Service: Endoscopy;  Laterality: N/A;  1:30   ESOPHAGOGASTRODUODENOSCOPY (EGD) WITH PROPOFOL N/A 11/05/2020   Procedure: ESOPHAGOGASTRODUODENOSCOPY (EGD) WITH PROPOFOL;  Surgeon: Dolores Frame, MD;  [facility];  Service: Gastroenterology;  Laterality: N/A;   HERNIA REPAIR     bilateral lower abdomen   POLYPECTOMY  11/05/2020   Procedure: POLYPECTOMY;  Surgeon: Dolores Frame, MD;  [facility];  Service: Gastroenterology;;   Gaspar Bidding DILATION  11/05/2020   Procedure: Gaspar Bidding DILATION;  Surgeon: Marguerita Merles, Reuel Boom, MD;  [facility];  Service: Gastroenterology;;     reports that he quit smoking about 29 years ago. His smoking use included cigarettes. He has a 66.00  pack-year smoking history. He has never used smokeless tobacco. He reports that he does not currently use alcohol. He reports that he does not use drugs.  Allergies  Allergen Reactions   Penicillins Rash    No problems with ampicillin during hospitalization    History reviewed. No pertinent family history.   Prior to Admission medications   Medication Sig Start Date End Date Taking? Authorizing Provider  acetaminophen (TYLENOL) 325 MG tablet Take 2 tablets (650 mg total) by mouth every 6 (six) hours as needed for mild pain (or Fever >/= 101). 11/26/19   Shon Hale, MD  albuterol (VENTOLIN HFA) 108 (90 Base) MCG/ACT inhaler Inhale 2 puffs into the lungs every 4 (four) hours as needed for wheezing or shortness of breath. 11/26/19   Shon Hale, MD  ALPRAZolam Prudy Feeler) 1 MG tablet Take 1 tablet (1 mg total) by mouth 3 (three) times daily as needed for anxiety. 01/28/19   Atha Starks, Ryan, DO  amLODipine (NORVASC) 5 MG tablet Take 5 mg by mouth at bedtime. 11/06/22   [provider]  aspirin EC 81 MG tablet Take 1 tablet (81 mg total) by mouth daily. Swallow whole. 09/30/20   Jonelle Sidle, MD  Budeson-Glycopyrrol-Formoterol (BREZTRI AEROSPHERE) 160-9-4.8 MCG/ACT AERO Inhale 2 puffs into the lungs in the morning and at bedtime. 12/13/22   Leroy Sea, MD  buprenorphine (SUBUTEX) 8 MG SUBL SL tablet Place 4-8 mg under the tongue See admin instructions. Take 8 mg by mouth in the morning and then take 4 mg by mouth at bedtime. 01/04/19   [provider]  cetirizine (ZYRTEC) 10 MG tablet Take 10 mg by mouth as needed for allergies (itching).    [provider]  famotidine (PEPCID) 20 MG tablet Take 1 tablet (20 mg total) by mouth 2 (two) times daily for 7 days. Take one tablet twice daily for two days 12/30/22 01/06/23  Gerhard Munch, MD  fluticasone [facility]) 50 MCG/ACT nasal spray Place 2 sprays into both nostrils daily.  08/22/18   [provider]   gabapentin (NEURONTIN) 400 MG capsule Take 1 capsule (400 mg total) by mouth every 8 (eight) hours as needed. Patient taking differently: Take 400 mg by mouth every 8 (eight) hours as needed (pain). 01/28/19   Welborn, Ryan, DO  glipiZIDE (GLUCOTROL XL) 10 MG 24 hr tablet Take 10 mg by mouth daily. 11/06/22   [provider]  guaiFENesin (MUCINEX) 600 MG 12 hr tablet Take 1 tablet (600 mg total) by mouth 2 (two) times daily as needed for cough. 12/13/22   Leroy Sea, MD  linezolid (ZYVOX) 600 MG tablet Take 1 tablet (600 mg total) by mouth every 12 (twelve) hours. 12/13/22   Leroy Sea, MD  metFORMIN (GLUCOPHAGE) 1000 MG tablet Take 1,000 mg by mouth 2 (two) times daily with a meal.  12/07/14   [provider]  montelukast (SINGULAIR) 10 MG tablet Take 1 tablet (10 mg total) by mouth at bedtime. 06/15/22   Mannam, Colbert Coyer, MD  ondansetron (ZOFRAN-ODT) 4 MG disintegrating tablet Take 1 tablet (4 mg total) by mouth every 8 (eight) hours as needed for nausea or vomiting. Consider taking 30 minutes prior to meals. 12/30/22  Gerhard Munch, MD  pantoprazole (PROTONIX) 40 MG tablet Take 1 tablet (40 mg total) by mouth daily. 12/15/20   Dolores Frame, MD  pramipexole (MIRAPEX) 0.5 MG tablet Take 0.5 mg by mouth at bedtime. 11/04/22   [provider]  pravastatin (PRAVACHOL) 20 MG tablet Take 20 mg by mouth every evening.    [provider]  sucralfate (CARAFATE) 1 g tablet Take 1 tablet (1 g total) by mouth 4 (four) times daily -  with meals and at bedtime. 12/30/22   Gerhard Munch, MD  valsartan (DIOVAN) 160 MG tablet Take 160 mg by mouth daily. 11/06/22   [provider]    Physical Exam: Vitals:   01/06/23 1322 01/06/23 1605 01/06/23 1615 01/06/23 1630  BP:   110/67 121/68  Pulse:   87 85  Resp:   (!) 21 (!) 21  Temp:  99.6 F (37.6 C)    TempSrc:  Rectal    SpO2:   93% 97%  Weight: 71.7 kg     Height: 5\' 5"  (1.651 m)        Constitutional: NAD, calm, comfortable Vitals:   01/06/23 1322 01/06/23 1605 01/06/23 1615 01/06/23 1630  BP:   110/67 121/68  Pulse:   87 85  Resp:   (!) 21 (!) 21  Temp:  99.6 F (37.6 C)    TempSrc:  Rectal    SpO2:   93% 97%  Weight: 71.7 kg     Height: 5\' 5"  (1.651 m)      Eyes: PERRL, lids and conjunctivae normal ENMT: Mucous membranes are dry. Posterior pharynx clear of any exudate or lesions.Normal dentition.  Neck: normal, supple, no masses, no thyromegaly Respiratory: clear to auscultation bilaterally, no wheezing, coarse crackles on right side. Normal respiratory effort. No accessory muscle use.  Cardiovascular: Regular rate and rhythm, no murmurs / rubs / gallops. No extremity edema. 2+ pedal pulses. No carotid bruits.  Abdomen: no tenderness, no masses palpated. No hepatosplenomegaly. Bowel sounds positive.  Musculoskeletal: no clubbing / cyanosis. No joint deformity upper and lower extremities. Good ROM, no contractures. Normal muscle tone.  Skin: no rashes, lesions, ulcers. No induration Neurologic: CN 2-12 grossly intact. Sensation intact, DTR normal. Strength 5/5 in all 4.  Psychiatric: Normal judgment and insight. Alert and oriented x 3. Normal mood.     Labs on Admission: I have personally reviewed following labs and imaging studies  CBC: Recent Labs  Lab 01/06/23 1332  WBC 20.2*  NEUTROABS 18.1*  HGB 8.7*  HCT 26.6*  MCV 88.1  PLT 587*   Basic Metabolic Panel: Recent Labs  Lab 01/06/23 1332  NA 128*  K 3.9  CL 93*  CO2 19*  GLUCOSE 190*  BUN 66*  CREATININE 4.20*  CALCIUM 7.8*   GFR: Estimated Creatinine Clearance: 12.4 mL/min (A) (by C-G formula based on SCr of 4.2 mg/dL (H)). Liver Function Tests: No results for input(s): "AST", "ALT", "ALKPHOS", "BILITOT", "PROT", "ALBUMIN" in the last 168 hours. No results for input(s): "LIPASE", "AMYLASE" in the last 168 hours. No results for input(s): "AMMONIA" in the last 168  hours. Coagulation Profile: Recent Labs  Lab 01/06/23 1540  INR 1.2   Cardiac Enzymes: No results for input(s): "CKTOTAL", "CKMB", "CKMBINDEX", "TROPONINI" in the last 168 hours. BNP (last 3 results) No results for input(s): "PROBNP" in the last 8760 hours. HbA1C: No results for input(s): "HGBA1C" in the last 72 hours. CBG: No results for input(s): "GLUCAP" in the last 168 hours. Lipid Profile:  No results for input(s): "CHOL", "HDL", "LDLCALC", "TRIG", "CHOLHDL", "LDLDIRECT" in the last 72 hours. Thyroid Function Tests: No results for input(s): "TSH", "T4TOTAL", "FREET4", "T3FREE", "THYROIDAB" in the last 72 hours. Anemia Panel: No results for input(s): "VITAMINB12", "FOLATE", "FERRITIN", "TIBC", "IRON", "RETICCTPCT" in the last 72 hours. Urine analysis:    Component Value Date/Time   COLORURINE AMBER (A) 01/06/2023 1618   APPEARANCEUR CLOUDY (A) 01/06/2023 1618   LABSPEC 1.015 01/06/2023 1618   PHURINE 5.0 01/06/2023 1618   GLUCOSEU 50 (A) 01/06/2023 1618   GLUCOSEU >=1000 (A) 04/12/2022 1020   HGBUR LARGE (A) 01/06/2023 1618   BILIRUBINUR NEGATIVE 01/06/2023 1618   KETONESUR NEGATIVE 01/06/2023 1618   PROTEINUR 100 (A) 01/06/2023 1618   UROBILINOGEN 0.2 04/12/2022 1020   NITRITE NEGATIVE 01/06/2023 1618   LEUKOCYTESUR TRACE (A) 01/06/2023 1618    Radiological Exams on Admission: DG Chest 2 View  Result Date: 01/06/2023 CLINICAL DATA:  Cough, recent pneumonia EXAM: CHEST - 2 VIEW COMPARISON:  Chest x-ray December 30, 2022 FINDINGS: The cardiomediastinal silhouette is unchanged in contour. Multifocal heterogeneous pulmonary opacities involving the majority of the right lung. There is an additional small patchy pulmonary opacity in the left midlung. No pleural effusion or pneumothorax. The visualized upper abdomen is unremarkable. No acute osseous abnormality. IMPRESSION: Near diffuse heterogeneous pulmonary opacities involving the right lung and small focal pulmonary opacity in  the left lower lobe. Overall, findings are concerning for multifocal infection. Electronically Signed   By: Jacob Moores M.D.   On: 01/06/2023 14:35    EKG: Independently reviewed.  Sinus, no acute ST changes.  Assessment/Plan Principal Problem:   Pneumonia Active Problems:   Acute exacerbation of COPD with asthma (HCC)   Acute respiratory failure with hypoxia (HCC)   Aspiration pneumonia (HCC)  (please populate well all problems here in Problem List. (For example, if patient is on BP meds at home and you resume or decide to hold them, it is a problem that needs to be her. Same for CAD, COPD, HLD and so on)  Aspiration pneumonia Recurrent multifocal pneumonia -Suspect patient has underlying gastroparesis -Continue current antibiotic regimen of vancomycin and cefepime -Culture sputum -Aspiration precaution -Speech evaluation  Sepsis -Secondary to pneumonia, evidenced by leukocytosis, elevated lactate -IV fluids, antibiotics  AKI -Prerenal likely secondary to volume depletion, likely secondary to repeated nauseous vomiting -Will check renal ultrasound -IV resuscitation and recheck renal function tomorrow  Hyponatremia, acute on chronic -With history of SIADH -Now patient is volume contracted, will use normal saline to correct volume status first -Probably will need sodium chloride tablet once volume status improved and lactic acid level normalized.  Question of gastroparesis -Clinically suspected, trial of Reglan every 6 hours -Outpatient GI follow-up for gastric emptying study -Other Ddx, speech evaluation rule out other etiology  COPD -Stable  HTN -Hold off home BP meds including Diovan  IIDM -Change to SSI while septic  DVT prophylaxis: Heparin subcu Code Status: Full code Family Communication: Wife and daughter at bedside Disposition Plan: Patient is sick with sepsis, requiring IV antibiotics fluid resuscitation, expect more than 2 midnight hospital  stay Consults called: None Admission status: Tele admit   Emeline General MD [facility] Pager [phone]  01/06/2023, 5:18 PM

## 2023-01-06 NOTE — ED Notes (Signed)
ED TO INPATIENT HANDOFF REPORT  ED Nurse Name and Phone #:   S Name/Age/Gender Travis Beck 77 y.o. male Room/Bed: 004C/004C  Code Status   Code Status: Full Code  Home/SNF/Other Home Patient oriented to: self, place, time, and situation Is this baseline? Yes   Triage Complete: Triage complete  Chief Complaint Pneumonia [J18.9]  Triage Note Pt c/o R flank/mid back pain x3 days.  Pain score 6/10.  Denies SOB, cough, and n/v/d.  Pt has appointment w/ Pulmonology on Monday.  Pt was recently admitted to [facility] w/ PNA.  Pt reports finishing course of antibiotics.    Pt reports regularly using incentive spirometer.       Allergies Allergies  Allergen Reactions   Penicillins Rash    No problems with ampicillin during hospitalization    Level of Care/Admitting Diagnosis ED Disposition     ED Disposition  Admit   Condition  --   Comment  Hospital Area: [facility] [[id number]]  Level of Care: Telemetry Medical [[id number]]  May admit patient to [facility] or [facility] if equivalent level of care is available:: No  Covid Evaluation: Asymptomatic - no recent exposure (last 10 days) testing not required  Diagnosis: Pneumonia [227785]  Admitting Physician: Emeline General [[id number]]  Attending Physician: Emeline General [[id number]]  Certification:: I certify this patient will need inpatient services for at least 2 midnights  Estimated Length of Stay: 2          B Medical/Surgery History Past Medical History:  Diagnosis Date   Arthritis    Asthma    Childhood   Emphysema lung (HCC)    Essential hypertension    Ischemic heart disease    Myoview 2020 indicating inferior infarct scar with mild peri-infarct ischemia - managed medically   Type 2 diabetes mellitus (HCC)    Past Surgical History:  Procedure Laterality Date   BIOPSY  08/30/2018   Procedure: BIOPSY;  Surgeon: Malissa Hippo, MD;  [facility]: [facility];  Service: Endoscopy;;  gastric     BIOPSY  11/05/2020   Procedure: BIOPSY;  Surgeon: Dolores Frame, MD;  [facility];  Service: Gastroenterology;;   COLONOSCOPY     COLONOSCOPY WITH PROPOFOL N/A 11/05/2020   Procedure: COLONOSCOPY WITH PROPOFOL;  Surgeon: Dolores Frame, MD;  [facility];  Service: Gastroenterology;  Laterality: N/A;  Am   ESOPHAGEAL DILATION N/A 05/21/2019   Procedure: ESOPHAGEAL DILATION;  Surgeon: Malissa Hippo, MD;  [facility];  Service: Endoscopy;  Laterality: N/A;   ESOPHAGOGASTRODUODENOSCOPY N/A 05/21/2019   Procedure: ESOPHAGOGASTRODUODENOSCOPY (EGD);  Surgeon: Malissa Hippo, MD;  [facility];  Service: Endoscopy;  Laterality: N/A;  730   ESOPHAGOGASTRODUODENOSCOPY (EGD) WITH PROPOFOL N/A 08/30/2018   Procedure: ESOPHAGOGASTRODUODENOSCOPY (EGD) WITH PROPOFOL;  Surgeon: Malissa Hippo, MD;  [facility];  Service: Endoscopy;  Laterality: N/A;  1:30   ESOPHAGOGASTRODUODENOSCOPY (EGD) WITH PROPOFOL N/A 11/05/2020   Procedure: ESOPHAGOGASTRODUODENOSCOPY (EGD) WITH PROPOFOL;  Surgeon: Dolores Frame, MD;  [facility];  Service: Gastroenterology;  Laterality: N/A;   HERNIA REPAIR     bilateral lower abdomen   POLYPECTOMY  11/05/2020   Procedure: POLYPECTOMY;  Surgeon: Dolores Frame, MD;  [facility];  Service: Gastroenterology;;   Gaspar Bidding DILATION  11/05/2020   Procedure: Gaspar Bidding DILATION;  Surgeon: Marguerita Merles, Reuel Boom, MD;  Location: [facility];  Service: Gastroenterology;;     A  IV Location/Drains/Wounds Patient Lines/Drains/Airways Status     Active Line/Drains/Airways     Name Placement date Placement time Site Days   Peripheral IV 01/06/23 18 G Right Antecubital 01/06/23  1630  Antecubital  less than 1            Intake/Output Last 24 hours No intake or output data in the 24 hours ending 01/06/23 1707  Labs/Imaging Results for orders placed or  performed during the hospital encounter of 01/06/23 (from the past 48 hour(s))  CBC with Differential     Status: Abnormal   Collection Time: 01/06/23  1:32 PM  Result Value Ref Range   WBC 20.2 (H) 4.0 - 10.5 K/uL   RBC 3.02 (L) 4.22 - 5.81 MIL/uL   Hemoglobin 8.7 (L) 13.0 - 17.0 g/dL   HCT 16.1 (L) 09.6 - 04.5 %   MCV 88.1 80.0 - 100.0 fL   MCH 28.8 26.0 - 34.0 pg   MCHC 32.7 30.0 - 36.0 g/dL   RDW 40.9 81.1 - 91.4 %   Platelets 587 (H) 150 - 400 K/uL   nRBC 0.0 0.0 - 0.2 %   Neutrophils Relative % 90 %   Neutro Abs 18.1 (H) 1.7 - 7.7 K/uL   Lymphocytes Relative 5 %   Lymphs Abs 1.0 0.7 - 4.0 K/uL   Monocytes Relative 4 %   Monocytes Absolute 0.7 0.1 - 1.0 K/uL   Eosinophils Relative 0 %   Eosinophils Absolute 0.1 0.0 - 0.5 K/uL   Basophils Relative 0 %   Basophils Absolute 0.1 0.0 - 0.1 K/uL   Immature Granulocytes 1 %   Abs Immature Granulocytes 0.24 (H) 0.00 - 0.07 K/uL    Comment: Performed at [facility], [address], [place], [place] [address]  Basic metabolic panel     Status: Abnormal   Collection Time: 01/06/23  1:32 PM  Result Value Ref Range   Sodium 128 (L) 135 - 145 mmol/L   Potassium 3.9 3.5 - 5.1 mmol/L   Chloride 93 (L) 98 - 111 mmol/L   CO2 19 (L) 22 - 32 mmol/L   Glucose, Bld 190 (H) 70 - 99 mg/dL    Comment: Glucose reference range applies only to samples taken after fasting for at least 8 hours.   BUN 66 (H) 8 - 23 mg/dL   Creatinine, Ser 6.21 (H) 0.61 - 1.24 mg/dL   Calcium 7.8 (L) 8.9 - 10.3 mg/dL   GFR, Estimated 14 (L) >60 mL/min    Comment: (NOTE) Calculated using the CKD-EPI Creatinine Equation (2021)    Anion gap 16 (H) 5 - 15    Comment: Performed at [facility], [address], [place], [place] [address]  Lactic acid, plasma     Status: Abnormal   Collection Time: 01/06/23  3:40 PM  Result Value Ref Range   Lactic Acid, Venous 2.7 (HH) 0.5 - 1.9 mmol/L    Comment: CRITICAL RESULT CALLED TO, READ BACK BY AND VERIFIED WITH  L Loreal Schuessler RN AT [phone] BY D LONG Performed at [facility], [address], [place], [place] [address]   Protime-INR     Status: Abnormal   Collection Time: 01/06/23  3:40 PM  Result Value Ref Range   Prothrombin Time 15.5 (H) 11.4 - 15.2 seconds   INR 1.2 0.8 - 1.2    Comment: (NOTE) INR goal varies based on device and disease states. Performed at [facility], [address], [place], [place] [address]   APTT     Status: Abnormal   Collection Time: 01/06/23  3:40 PM  Result Value Ref Range   aPTT 44 (H) 24 - 36 seconds    Comment:        IF BASELINE aPTT IS ELEVATED, SUGGEST PATIENT RISK ASSESSMENT BE USED TO DETERMINE APPROPRIATE ANTICOAGULANT THERAPY. Performed at [facility], [address], [place], [place] [address]   Urinalysis, w/ Reflex to Culture (Infection Suspected) -Urine, Clean Catch     Status: Abnormal   Collection Time: 01/06/23  4:18 PM  Result Value Ref Range   Specimen Source URINE, CLEAN CATCH    Color, Urine AMBER (A) YELLOW    Comment: BIOCHEMICALS MAY BE AFFECTED BY COLOR   APPearance CLOUDY (A) CLEAR   Specific Gravity, Urine 1.015 1.005 - 1.030   pH 5.0 5.0 - 8.0   Glucose, UA 50 (A) NEGATIVE mg/dL   Hgb urine dipstick LARGE (A) NEGATIVE   Bilirubin Urine NEGATIVE NEGATIVE   Ketones, ur NEGATIVE NEGATIVE mg/dL   Protein, ur 098 (A) NEGATIVE mg/dL   Nitrite NEGATIVE NEGATIVE   Leukocytes,Ua TRACE (A) NEGATIVE   RBC / HPF >50 0 - 5 RBC/hpf   WBC, UA 21-50 0 - 5 WBC/hpf    Comment:        Reflex urine culture not performed if WBC <=10, OR if Squamous epithelial cells >5. If Squamous epithelial cells >5 suggest recollection.    Bacteria, UA MANY (A) NONE SEEN   Squamous Epithelial / HPF 0-5 0 - 5 /HPF    Comment: Performed at [facility], [address], [place], [place] [address]  Urine Culture     Status: None (Preliminary result)   Collection Time: 01/06/23  4:18 PM   Specimen: Urine, Random  Result Value Ref  Range   Specimen Description URINE, RANDOM    Special Requests      URINE, CLEAN CATCH Performed at [facility], [address] [place], [place] [address]    Culture PENDING    Report Status PENDING    DG Chest 2 View  Result Date: 01/06/2023 CLINICAL DATA:  Cough, recent pneumonia EXAM: CHEST - 2 VIEW COMPARISON:  Chest x-ray December 30, 2022 FINDINGS: The cardiomediastinal silhouette is unchanged in contour. Multifocal heterogeneous pulmonary opacities involving the majority of the right lung. There is an additional small patchy pulmonary opacity in the left midlung. No pleural effusion or pneumothorax. The visualized upper abdomen is unremarkable. No acute osseous abnormality. IMPRESSION: Near diffuse heterogeneous pulmonary opacities involving the right lung and small focal pulmonary opacity in the left lower lobe. Overall, findings are concerning for multifocal infection. Electronically Signed   By: Jacob Moores M.D.   On: 01/06/2023 14:35    Pending Labs Unresulted Labs (From admission, onward)     Start     Ordered   01/07/23 0500  Basic metabolic panel  Daily,   R      01/06/23 1530   01/07/23 0500  CBC  Daily,   R      01/06/23 1530   01/06/23 1522  Resp panel by RT-PCR (RSV, Flu A&B, Covid) Anterior Nasal Swab  (Septic presentation on arrival (screening labs, nursing and treatment orders for obvious sepsis))  Once,   URGENT        01/06/23 1522   01/06/23 1522  Lactic acid, plasma  (Septic presentation on arrival (screening labs, nursing and treatment orders  for obvious sepsis))  Now then every 2 hours,   R (with STAT occurrences)      01/06/23 1522   01/06/23 1522  Blood Culture (routine x 2)  (Septic presentation on arrival (screening labs, nursing and treatment orders for obvious sepsis))  BLOOD CULTURE X 2,   STAT      01/06/23 1522            Vitals/Pain Today's Vitals   01/06/23 1322 01/06/23 1605 01/06/23 1615 01/06/23 1630  BP:   110/67 121/68  Pulse:    87 85  Resp:   (!) 21 (!) 21  Temp:  99.6 F (37.6 C)    TempSrc:  Rectal    SpO2:   93% 97%  Weight: 71.7 kg     Height: 5\' 5"  (1.651 m)     PainSc: 6        Isolation Precautions No active isolations  Medications Medications  lactated ringers infusion (has no administration in time range)  lactated ringers bolus 1,000 mL (has no administration in time range)    And  lactated ringers bolus 1,000 mL (1,000 mLs Intravenous New Bag/Given 01/06/23 1631)    And  lactated ringers bolus 250 mL (has no administration in time range)  azithromycin (ZITHROMAX) 500 mg in sodium chloride 0.9 % 250 mL IVPB (500 mg Intravenous New Bag/Given 01/06/23 1639)  ceFEPIme (MAXIPIME) 2 g in sodium chloride 0.9 % 100 mL IVPB (2 g Intravenous New Bag/Given 01/06/23 1630)  vancomycin (VANCOREADY) IVPB 1500 mg/300 mL (has no administration in time range)  vancomycin variable dose per unstable renal function (pharmacist dosing) (has no administration in time range)  metoCLOPramide (REGLAN) injection 10 mg (has no administration in time range)  acetaminophen (TYLENOL) tablet 650 mg (has no administration in time range)  aspirin EC tablet 81 mg (has no administration in time range)  buprenorphine (SUBUTEX) SL tablet 4-8 mg (has no administration in time range)  pravastatin (PRAVACHOL) tablet 20 mg (has no administration in time range)  ALPRAZolam (XANAX) tablet 1 mg (has no administration in time range)  famotidine (PEPCID) tablet 20 mg (has no administration in time range)  pantoprazole (PROTONIX) EC tablet 40 mg (has no administration in time range)  sucralfate (CARAFATE) tablet 1 g (has no administration in time range)  gabapentin (NEURONTIN) capsule 100 mg (has no administration in time range)  pramipexole (MIRAPEX) tablet 0.5 mg (has no administration in time range)  albuterol (VENTOLIN HFA) 108 (90 Base) MCG/ACT inhaler 2 puff (has no administration in time range)  loratadine (CLARITIN) tablet 10 mg  (has no administration in time range)  fluticasone furoate-vilanterol (BREO ELLIPTA) 200-25 MCG/ACT 1 puff (has no administration in time range)  fluticasone (FLONASE) 50 MCG/ACT nasal spray 2 spray (has no administration in time range)  guaiFENesin (MUCINEX) 12 hr tablet 600 mg (has no administration in time range)  montelukast (SINGULAIR) tablet 10 mg (has no administration in time range)  heparin injection 5,000 Units (has no administration in time range)  0.9 %  sodium chloride infusion (has no administration in time range)  insulin aspart (novoLOG) injection 0-9 Units (has no administration in time range)  acetaminophen (TYLENOL) tablet 1,000 mg (1,000 mg Oral Given 01/06/23 1631)    Mobility walks     Focused Assessments Pulmonary Assessment Handoff:  Lung sounds:   O2 Device: Nasal Cannula O2 Flow Rate (L/min): 3 L/min    R Recommendations: See Admitting Provider Note  Report given to:   Additional Notes:

## 2023-01-06 NOTE — ED Provider Notes (Signed)
[place] EMERGENCY DEPARTMENT AT [facility] Provider Note   CSN: [id number] Arrival date & time: 01/06/23  1243     History  Chief Complaint  Patient presents with   Back Pain    Tarance Penry is a 77 y.o. male.  The history is provided by the patient, the spouse and medical records. No language interpreter was used.  Back Pain    Mr. Gulbrandson is a 77 year old male who presents to the ED with chief complaint of PNA x 4 weeks. The patient has been admitted to the hospital twice for PNA over the last 5 weeks. He now has new onset of pain near his thoracic spine that radiates to his right mid-axillary line. His daughter and wife who accompany him state that they are worried he has not made any improvement because he still is still lethargic and has not been able to eat much. He had multiple episodes of vomiting last week and one approximately 48 hours ago. They state he finished all previous ABX courses as directed. He was given supplemental oxygen after his last admission and was told to use 2L for the next 3 weeks. He reports using his incentive spirometer regularly and has a pulmonology appt this coming Monday.   Home Medications Prior to Admission medications   Medication Sig Start Date End Date Taking? Authorizing Provider  acetaminophen (TYLENOL) 325 MG tablet Take 2 tablets (650 mg total) by mouth every 6 (six) hours as needed for mild pain (or Fever >/= 101). 11/26/19   Shon Hale, MD  albuterol (VENTOLIN HFA) 108 (90 Base) MCG/ACT inhaler Inhale 2 puffs into the lungs every 4 (four) hours as needed for wheezing or shortness of breath. 11/26/19   Shon Hale, MD  ALPRAZolam Prudy Feeler) 1 MG tablet Take 1 tablet (1 mg total) by mouth 3 (three) times daily as needed for anxiety. 01/28/19   Atha Starks, Ryan, DO  amLODipine (NORVASC) 5 MG tablet Take 5 mg by mouth at bedtime. 11/06/22   [provider]  aspirin EC 81 MG tablet Take 1 tablet (81 mg total) by  mouth daily. Swallow whole. 09/30/20   Jonelle Sidle, MD  Budeson-Glycopyrrol-Formoterol (BREZTRI AEROSPHERE) 160-9-4.8 MCG/ACT AERO Inhale 2 puffs into the lungs in the morning and at bedtime. 12/13/22   Leroy Sea, MD  buprenorphine (SUBUTEX) 8 MG SUBL SL tablet Place 4-8 mg under the tongue See admin instructions. Take 8 mg by mouth in the morning and then take 4 mg by mouth at bedtime. 01/04/19   [provider]  cetirizine (ZYRTEC) 10 MG tablet Take 10 mg by mouth as needed for allergies (itching).    [provider]  famotidine (PEPCID) 20 MG tablet Take 1 tablet (20 mg total) by mouth 2 (two) times daily for 7 days. Take one tablet twice daily for two days 12/30/22 01/06/23  Gerhard Munch, MD  fluticasone [facility]) 50 MCG/ACT nasal spray Place 2 sprays into both nostrils daily.  08/22/18   [provider]  gabapentin (NEURONTIN) 400 MG capsule Take 1 capsule (400 mg total) by mouth every 8 (eight) hours as needed. Patient taking differently: Take 400 mg by mouth every 8 (eight) hours as needed (pain). 01/28/19   Welborn, Ryan, DO  glipiZIDE (GLUCOTROL XL) 10 MG 24 hr tablet Take 10 mg by mouth daily. 11/06/22   [provider]  guaiFENesin (MUCINEX) 600 MG 12 hr tablet Take 1 tablet (600 mg total) by mouth 2 (two) times  daily as needed for cough. 12/13/22   Leroy Sea, MD  linezolid (ZYVOX) 600 MG tablet Take 1 tablet (600 mg total) by mouth every 12 (twelve) hours. 12/13/22   Leroy Sea, MD  metFORMIN (GLUCOPHAGE) 1000 MG tablet Take 1,000 mg by mouth 2 (two) times daily with a meal.  12/07/14   [provider]  montelukast (SINGULAIR) 10 MG tablet Take 1 tablet (10 mg total) by mouth at bedtime. 06/15/22   Mannam, Colbert Coyer, MD  ondansetron (ZOFRAN-ODT) 4 MG disintegrating tablet Take 1 tablet (4 mg total) by mouth every 8 (eight) hours as needed for nausea or vomiting. Consider taking 30 minutes prior to meals. 12/30/22   Gerhard Munch, MD  pantoprazole (PROTONIX) 40 MG tablet Take 1 tablet (40 mg total) by mouth daily. 12/15/20   Dolores Frame, MD  pramipexole (MIRAPEX) 0.5 MG tablet Take 0.5 mg by mouth at bedtime. 11/04/22   [provider]  pravastatin (PRAVACHOL) 20 MG tablet Take 20 mg by mouth every evening.    [provider]  sucralfate (CARAFATE) 1 g tablet Take 1 tablet (1 g total) by mouth 4 (four) times daily -  with meals and at bedtime. 12/30/22   Gerhard Munch, MD  valsartan (DIOVAN) 160 MG tablet Take 160 mg by mouth daily. 11/06/22   [provider]      Allergies    Penicillins    Review of Systems   Review of Systems  Musculoskeletal:  Positive for back pain.  All other systems reviewed and are negative.   Physical Exam Updated Vital Signs BP 106/67 (BP Location: Right Arm)   Pulse 79   Temp 98 F (36.7 C) (Oral)   Resp 18   Ht 5\' 5"  (1.651 m)   Wt 71.7 kg   SpO2 91%   BMI 26.29 kg/m  Physical Exam Vitals and nursing note reviewed.  Constitutional:      General: He is not in acute distress.    Appearance: He is well-developed.  HENT:     Head: Atraumatic.  Eyes:     Conjunctiva/sclera: Conjunctivae normal.  Cardiovascular:     Rate and Rhythm: Normal rate and regular rhythm.     Pulses: Normal pulses.     Heart sounds: Normal heart sounds.  Pulmonary:     Breath sounds: Rales present. No wheezing or rhonchi.  Abdominal:     Palpations: Abdomen is soft.     Tenderness: There is no abdominal tenderness.  Musculoskeletal:     Cervical back: Neck supple.     Right lower leg: No edema.     Left lower leg: No edema.  Skin:    Findings: No rash.  Neurological:     Mental Status: He is alert. He is disoriented.     ED Results / Procedures / Treatments   Labs (all labs ordered are listed, but only abnormal results are displayed) Labs Reviewed  CBC WITH DIFFERENTIAL/PLATELET - Abnormal; Notable for the following components:       Result Value   WBC 20.2 (*)    RBC 3.02 (*)    Hemoglobin 8.7 (*)    HCT 26.6 (*)    Platelets 587 (*)    Neutro Abs 18.1 (*)    Abs Immature Granulocytes 0.24 (*)    All other components within normal limits  BASIC METABOLIC PANEL - Abnormal; Notable for the following components:   Sodium 128 (*)    Chloride 93 (*)  CO2 19 (*)    Glucose, Bld 190 (*)    BUN 66 (*)    Creatinine, Ser 4.20 (*)    Calcium 7.8 (*)    GFR, Estimated 14 (*)    Anion gap 16 (*)    All other components within normal limits  PROTIME-INR - Abnormal; Notable for the following components:   Prothrombin Time 15.5 (*)    All other components within normal limits  APTT - Abnormal; Notable for the following components:   aPTT 44 (*)    All other components within normal limits  RESP PANEL BY RT-PCR (RSV, FLU A&B, COVID)  RVPGX2  CULTURE, BLOOD (ROUTINE X 2)  CULTURE, BLOOD (ROUTINE X 2)  LACTIC ACID, PLASMA  LACTIC ACID, PLASMA  URINALYSIS, W/ REFLEX TO CULTURE (INFECTION SUSPECTED)  BLOOD GAS, ARTERIAL    EKG EKG Interpretation Date/Time:  Saturday January 06 2023 15:52:57 EDT Ventricular Rate:  91 PR Interval:  162 QRS Duration:  100 QT Interval:  366 QTC Calculation: 441 R Axis:   38  Text Interpretation: Sinus rhythm Atrial premature complexes Borderline low voltage, extremity leads Confirmed by Alvino Blood ([id number]) on 01/06/2023 4:21:24 PM  Radiology DG Chest 2 View  Result Date: 01/06/2023 CLINICAL DATA:  Cough, recent pneumonia EXAM: CHEST - 2 VIEW COMPARISON:  Chest x-ray December 30, 2022 FINDINGS: The cardiomediastinal silhouette is unchanged in contour. Multifocal heterogeneous pulmonary opacities involving the majority of the right lung. There is an additional small patchy pulmonary opacity in the left midlung. No pleural effusion or pneumothorax. The visualized upper abdomen is unremarkable. No acute osseous abnormality. IMPRESSION: Near diffuse heterogeneous pulmonary opacities  involving the right lung and small focal pulmonary opacity in the left lower lobe. Overall, findings are concerning for multifocal infection. Electronically Signed   By: Jacob Moores M.D.   On: 01/06/2023 14:35    Procedures .Critical Care  Performed by: Fayrene Helper, PA-C Authorized by: Fayrene Helper, PA-C   Critical care provider statement:    Critical care time (minutes):  30   Critical care was time spent personally by me on the following activities:  Development of treatment plan with patient or surrogate, discussions with consultants, evaluation of patient's response to treatment, examination of patient, ordering and review of laboratory studies, ordering and review of radiographic studies, ordering and performing treatments and interventions, pulse oximetry, re-evaluation of patient's condition and review of old charts     Medications Ordered in ED Medications  lactated ringers infusion (has no administration in time range)  lactated ringers bolus 1,000 mL (has no administration in time range)    And  lactated ringers bolus 1,000 mL (has no administration in time range)    And  lactated ringers bolus 250 mL (has no administration in time range)  azithromycin (ZITHROMAX) 500 mg in sodium chloride 0.9 % 250 mL IVPB (has no administration in time range)  ceFEPIme (MAXIPIME) 2 g in sodium chloride 0.9 % 100 mL IVPB (has no administration in time range)  vancomycin (VANCOREADY) IVPB 1500 mg/300 mL (has no administration in time range)  vancomycin variable dose per unstable renal function (pharmacist dosing) (has no administration in time range)  acetaminophen (TYLENOL) tablet 1,000 mg (has no administration in time range)    ED Course/ Medical Decision Making/ A&P                             Medical Decision Making Amount and/or Complexity of  Data Reviewed Labs: ordered. Radiology: ordered. ECG/medicine tests: ordered.  Risk OTC drugs. Prescription drug management.   BP  106/67 (BP Location: Right Arm)   Pulse 79   Temp 98 F (36.7 C) (Oral)   Resp 18   Ht 5\' 5"  (1.651 m)   Wt 71.7 kg   SpO2 91%   BMI 26.29 kg/m   3:23 PM Mr. Kehl is a 77 year old male who presents to the ED with chief complaint of PNA x 4 weeks. The patient has been admitted to the hospital twice for PNA over the last 5 weeks. He now has new onset of pain near his thoracic spine that radiates to his right mid-axillary line. His daughter and wife who accompany him state that they are worried he has not made any improvement because he still is still lethargic and has not been able to eat much. He had multiple episodes of vomiting last week and one approximately 48 hours ago. They state he finished all previous ABX courses as directed. He was given supplemental oxygen after his last admission and was told to use 2L for the next 3 weeks. He reports using his incentive spirometer regularly and has a pulmonology appt this coming Monday.   On exam Currently in bed wearing supplemental oxygen.  To be in no acute discomfort.  Heart with regular rate and rhythm, lungs with crackles heard simply on the right as well as left lower lobe.  Abdomen is soft nontender, no peripheral edema noted.  Patient is alert and oriented x 2.  Strength is equal throughout.  Vital signs notable for O2 sats of 91% on room air improves with 3L of O2  -Labs ordered, independently viewed and interpreted by me.  Labs remarkable for WBC 20, Lactic acid 2.7 concerning for sepsis.  Hgb 8.7 near baseline.  -The patient was maintained on a cardiac monitor.  I personally viewed and interpreted the cardiac monitored which showed an underlying rhythm of: NSR -Imaging independently viewed and interpreted by me and I agree with radiologist's interpretation.  Result remarkable for CXR showing multifocal pneumonia -This patient presents to the ED for concern of weakness, this involves an extensive number of treatment options, and is a  complaint that carries with it a high risk of complications and morbidity.  The differential diagnosis includes pneumonia, decondition, dehydration, sepsis -Co morbidities that complicate the patient evaluation includes recurrent pneumonia, HTN, DM -Treatment includes azithromycin, vancomycin, cefepime, IVF, tylenol -Reevaluation of the patient after these medicines showed that the patient improved -PCP office notes or outside notes reviewed -Discussion with specialist [facility] DR. Zhang who agrees to admit pt -Escalation to admission/observation considered: patient is agreeable with admission        Final Clinical Impression(s) / ED Diagnoses Final diagnoses:  Multifocal pneumonia  AKI (acute kidney injury) (HCC)  Sepsis due to methicillin resistant Staphylococcus aureus (MRSA) with acute renal failure without septic shock, unspecified acute renal failure type [facility])    Rx / DC Orders ED Discharge Orders     None         Fayrene Helper, PA-C 01/06/23 1650    Lonell Grandchild, MD 01/06/23 [phone]

## 2023-01-07 ENCOUNTER — Inpatient Hospital Stay (HOSPITAL_COMMUNITY): Payer: Medicare Other

## 2023-01-07 DIAGNOSIS — J9601 Acute respiratory failure with hypoxia: Secondary | ICD-10-CM

## 2023-01-07 DIAGNOSIS — J690 Pneumonitis due to inhalation of food and vomit: Secondary | ICD-10-CM | POA: Diagnosis not present

## 2023-01-07 LAB — BASIC METABOLIC PANEL
Anion gap: 11 (ref 5–15)
BUN: 63 mg/dL — ABNORMAL HIGH (ref 8–23)
CO2: 17 mmol/L — ABNORMAL LOW (ref 22–32)
Calcium: 7.1 mg/dL — ABNORMAL LOW (ref 8.9–10.3)
Chloride: 98 mmol/L (ref 98–111)
Creatinine, Ser: 3.94 mg/dL — ABNORMAL HIGH (ref 0.61–1.24)
GFR, Estimated: 15 mL/min — ABNORMAL LOW (ref >60)
Glucose, Bld: 76 mg/dL (ref 70–99)
Potassium: 3.8 mmol/L (ref 3.5–5.1)
Sodium: 126 mmol/L — ABNORMAL LOW (ref 135–145)

## 2023-01-07 LAB — CBC
HCT: 20.9 % — ABNORMAL LOW (ref 39.0–52.0)
Hemoglobin: 6.8 g/dL — CL (ref 13.0–17.0)
MCH: 28.5 pg (ref 26.0–34.0)
MCHC: 32.5 g/dL (ref 30.0–36.0)
MCV: 87.4 fL (ref 80.0–100.0)
Platelets: 427 10*3/uL — ABNORMAL HIGH (ref 150–400)
RBC: 2.39 MIL/uL — ABNORMAL LOW (ref 4.22–5.81)
RDW: 14.1 % (ref 11.5–15.5)
WBC: 15 10*3/uL — ABNORMAL HIGH (ref 4.0–10.5)
nRBC: 0 % (ref 0.0–0.2)

## 2023-01-07 LAB — URINE CULTURE: Culture: NO GROWTH

## 2023-01-07 LAB — GLUCOSE, CAPILLARY
Glucose-Capillary: 51 mg/dL — ABNORMAL LOW (ref 70–99)
Glucose-Capillary: 88 mg/dL (ref 70–99)
Glucose-Capillary: 154 mg/dL — ABNORMAL HIGH (ref 70–99)
Glucose-Capillary: 175 mg/dL — ABNORMAL HIGH (ref 70–99)
Glucose-Capillary: 117 mg/dL — ABNORMAL HIGH (ref 70–99)

## 2023-01-07 LAB — HEMOGLOBIN AND HEMATOCRIT, BLOOD
HCT: 23.6 % — ABNORMAL LOW (ref 39.0–52.0)
Hemoglobin: 7.7 g/dL — ABNORMAL LOW (ref 13.0–17.0)

## 2023-01-07 LAB — SODIUM, URINE, RANDOM: Sodium, Ur: 13 mmol/L

## 2023-01-07 LAB — LACTIC ACID, PLASMA: Lactic Acid, Venous: 1.3 mmol/L (ref 0.5–1.9)

## 2023-01-07 LAB — OSMOLALITY, URINE: Osmolality, Ur: 253 mOsm/kg — ABNORMAL LOW (ref 300–900)

## 2023-01-07 LAB — VANCOMYCIN, RANDOM: Vancomycin Rm: 17 ug/mL

## 2023-01-07 LAB — CULTURE, BLOOD (ROUTINE X 2)

## 2023-01-07 MED ORDER — IPRATROPIUM BROMIDE 0.02 % IN SOLN
0.5000 mg | Freq: Four times a day (QID) | RESPIRATORY_TRACT | Status: DC
  Administered 2023-01-07 – 2023-01-08 (×7): 0.5 mg via RESPIRATORY_TRACT
  Filled 2023-01-07 (×7): qty 2.5

## 2023-01-07 MED ORDER — GLUCERNA SHAKE PO LIQD: 237.0000 mL | Freq: Two times a day (BID) | ORAL | Status: DC

## 2023-01-07 MED ORDER — ARFORMOTEROL TARTRATE 15 MCG/2ML IN NEBU
15.0000 ug | INHALATION_SOLUTION | Freq: Two times a day (BID) | RESPIRATORY_TRACT | Status: DC
  Administered 2023-01-07 – 2023-01-24 (×35): 15 ug via RESPIRATORY_TRACT
  Filled 2023-01-07 (×38): qty 2

## 2023-01-07 MED ORDER — LEVALBUTEROL HCL 0.63 MG/3ML IN NEBU
0.6300 mg | INHALATION_SOLUTION | Freq: Four times a day (QID) | RESPIRATORY_TRACT | Status: DC
  Administered 2023-01-07 – 2023-01-08 (×7): 0.63 mg via RESPIRATORY_TRACT
  Filled 2023-01-07 (×7): qty 3

## 2023-01-07 MED ORDER — VANCOMYCIN HCL IN DEXTROSE 1-5 GM/200ML-% IV SOLN
1000.0000 mg | Freq: Once | INTRAVENOUS | Status: AC
  Administered 2023-01-07: 1000 mg via INTRAVENOUS
  Filled 2023-01-07: qty 200

## 2023-01-07 MED ORDER — GUAIFENESIN ER 600 MG PO TB12
1200.0000 mg | ORAL_TABLET | Freq: Two times a day (BID) | ORAL | Status: DC
  Administered 2023-01-07 – 2023-01-08 (×3): 1200 mg via ORAL
  Filled 2023-01-07 (×4): qty 2

## 2023-01-07 MED ORDER — BUDESONIDE 0.25 MG/2ML IN SUSP
0.2500 mg | Freq: Two times a day (BID) | RESPIRATORY_TRACT | Status: DC
  Administered 2023-01-07 – 2023-01-08 (×2): 0.25 mg via RESPIRATORY_TRACT
  Filled 2023-01-07 (×2): qty 2

## 2023-01-07 MED ORDER — RENA-VITE PO TABS
1.0000 | ORAL_TABLET | Freq: Every day | ORAL | Status: DC
  Administered 2023-01-07: 1 via ORAL
  Filled 2023-01-07: qty 1

## 2023-01-07 NOTE — Progress Notes (Signed)
   01/07/23 0237  Provider Notification  Provider Name/Title Marja Kays  Date Provider Notified 01/07/23  Time Provider Notified [phone]  Method of Notification Page (secure chat)  Notification Reason Critical Result  Test performed and critical result Hemoglobin-6.8  Date Critical Result Received 01/07/23  Time Critical Result Received 0237  Provider response See new orders  Date of Provider Response 01/07/23  Time of Provider Response [phone]

## 2023-01-07 NOTE — Evaluation (Signed)
Clinical/Bedside Swallow Evaluation Patient Details  Name: Travis Beck MRN: [id number] Date of Birth: 16-Aug-1945  Today's Date: 01/07/2023 Time: SLP Start Time (ACUTE ONLY): 1105 SLP Stop Time (ACUTE ONLY): 1115 SLP Time Calculation (min) (ACUTE ONLY): 10 min  Past Medical History:  Past Medical History:  Diagnosis Date   Arthritis    Asthma    Childhood   Emphysema lung (HCC)    Essential hypertension    Ischemic heart disease    Myoview 2020 indicating inferior infarct scar with mild peri-infarct ischemia - managed medically   Type 2 diabetes mellitus (HCC)    Past Surgical History:  Past Surgical History:  Procedure Laterality Date   BIOPSY  08/30/2018   Procedure: BIOPSY;  Surgeon: Malissa Hippo, MD;  [facility];  Service: Endoscopy;;  gastric    BIOPSY  11/05/2020   Procedure: BIOPSY;  Surgeon: Dolores Frame, MD;  [facility]: [facility];  Service: Gastroenterology;;   COLONOSCOPY     COLONOSCOPY WITH PROPOFOL N/A 11/05/2020   Procedure: COLONOSCOPY WITH PROPOFOL;  Surgeon: Dolores Frame, MD;  [facility]: [facility];  Service: Gastroenterology;  Laterality: N/A;  Am   ESOPHAGEAL DILATION N/A 05/21/2019   Procedure: ESOPHAGEAL DILATION;  Surgeon: Malissa Hippo, MD;  [facility]: [facility];  Service: Endoscopy;  Laterality: N/A;   ESOPHAGOGASTRODUODENOSCOPY N/A 05/21/2019   Procedure: ESOPHAGOGASTRODUODENOSCOPY (EGD);  Surgeon: Malissa Hippo, MD;  [facility];  Service: Endoscopy;  Laterality: N/A;  730   ESOPHAGOGASTRODUODENOSCOPY (EGD) WITH PROPOFOL N/A 08/30/2018   Procedure: ESOPHAGOGASTRODUODENOSCOPY (EGD) WITH PROPOFOL;  Surgeon: Malissa Hippo, MD;  [facility];  Service: Endoscopy;  Laterality: N/A;  1:30   ESOPHAGOGASTRODUODENOSCOPY (EGD) WITH PROPOFOL N/A 11/05/2020   Procedure: ESOPHAGOGASTRODUODENOSCOPY (EGD) WITH PROPOFOL;  Surgeon: Dolores Frame, MD;  [facility];  Service: Gastroenterology;  Laterality: N/A;   HERNIA REPAIR     bilateral lower abdomen   POLYPECTOMY  11/05/2020   Procedure: POLYPECTOMY;  Surgeon: Marguerita Merles, Reuel Boom, MD;  [facility]: [facility];  Service: Gastroenterology;;   Gaspar Bidding DILATION  11/05/2020   Procedure: Gaspar Bidding DILATION;  Surgeon: Marguerita Merles, Reuel Boom, MD;  [facility]: [facility];  Service: Gastroenterology;;   HPI:  Patient is a 77 y.o. male with PMH: HTN, DM-2, COPD, recurrent PNA, esophageal dysphagia (has had EGD's with dilation in 2020 and most recently in 2022). He presented to the hospital on 01/06/2023 with nausea, comiting, cough and SOB. He was recently admitted and discharged back home three weeks ago for multifocal PNA. He presented to [facility] 12/29/22 with nausea, vomiting and GI cough; symptoms improved but have returned.    Assessment / Plan / Recommendation  Clinical Impression  Patient did not exhibit any overt s/s aspiration or dysphagia as per this bedside swallow evaluation, however concern is for possible aspiration or pharyngeal phase dysphagia and as he has been having recurrent PNA's, SLP recommending MBS to r/o aspiration/dysphagia. We will procede with this today. SLP Visit Diagnosis: Dysphagia, unspecified (R13.10)    Aspiration Risk  Mild aspiration risk    Diet Recommendation Regular;Thin liquid    Liquid Administration via: Cup;Straw Medication Administration: Whole meds with liquid Supervision: Patient able to self feed Compensations: Slow rate Postural Changes: Seated upright at 90 degrees;Remain upright for at least 30 minutes after po intake    Other  Recommendations Oral Care Recommendations: Oral care BID    Recommendations for follow up therapy are one component  of a multi-disciplinary discharge planning process, led by the attending physician.  Recommendations may be updated based on patient status, additional functional criteria and insurance authorization.  Follow  up Recommendations No SLP follow up      Assistance Recommended at Discharge    Functional Status Assessment Patient has had a recent decline in their functional status and demonstrates the ability to make significant improvements in function in a reasonable and predictable amount of time.  Frequency and Duration min 1 x/week  1 week       Prognosis Prognosis for improved oropharyngeal function: Good      Swallow Study   General Date of Onset: 01/06/23 HPI: Patient is a 77 y.o. male with PMH: HTN, DM-2, COPD, recurrent PNA, esophageal dysphagia (has had EGD's with dilation in 2020 and most recently in 2022). He presented to the hospital on 01/06/2023 with nausea, comiting, cough and SOB. He was recently admitted and discharged back home three weeks ago for multifocal PNA. He presented to [facility] 12/29/22 with nausea, vomiting and GI cough; symptoms improved but have returned. Type of Study: Bedside Swallow Evaluation Previous Swallow Assessment: 12/08/2022 Diet Prior to this Study: Regular;Thin liquids (Level 0) Temperature Spikes Noted: No Respiratory Status: Room air History of Recent Intubation: No Behavior/Cognition: Alert;Cooperative;Pleasant mood Oral Cavity Assessment: Within Functional Limits Oral Care Completed by SLP: No Oral Cavity - Dentition: Adequate natural dentition Vision: Functional for self-feeding Self-Feeding Abilities: Able to feed self Patient Positioning: Upright in bed Baseline Vocal Quality: Normal Volitional Cough: Strong Volitional Swallow: Able to elicit    Oral/Motor/Sensory Function Overall Oral Motor/Sensory Function: Within functional limits   Ice Chips     Thin Liquid Thin Liquid: Within functional limits Presentation: Straw;Self Fed    Nectar Thick     Honey Thick     Puree Puree: Not tested   Solid     Solid: Not tested      Angela Nevin, MA, CCC-SLP Speech Therapy

## 2023-01-07 NOTE — Progress Notes (Signed)
PROGRESS NOTE    Travis Beck  ZOX:[id number] DOB: 1946/02/02 DOA: 01/06/2023 PCP: Kirstie Peri, MD   Brief Narrative:  Is a 77 year old Caucasian male with a past medical history significant for but limited to diabetes mellitus type 2, hypertension, SIADH, COPD, diabetic neuropathy, recurrent pneumonia as well as other comorbidities who presented with worsening nausea, vomiting and cough as well as shortness of breath.  He is recently hospitalized for multifocal pneumonia and septic shock with MRSA pneumonia and discharged on oral Zyvox and discharged 3 weeks ago to home.  Initially his breathing symptoms improved with Zyvox however he continued to have frequent nauseous and vomiting and coughing episodes.  He described that he felt like food or water did not get stuck in his chest and about 10 to 20 minutes after he eats or drinks that he feels nauseous and throwing up undigested food and he has been throwing up for the last few hours even with clear liquids.  He went to the [facility] ED with his concerns of nausea vomiting and cough and was given IV fluid and symptomatic treatment and discharged home with Zofran and GI cocktail.  Over the next few days his symptoms were better however his nausea vomiting and subsequently became worse so he came back to the ED for further evaluation.  He was admitted for sepsis and found to have a recurrent aspiration and recurrent multifocal pneumonia along with an AKI.  His nausea vomiting is improved and he is given symptomatic treatment.  Assessment and Plan:  Aspiration pneumonia Recurrent multifocal pneumonia Acute Respiratory Failure with Hypoxia  -Suspect patient has underlying gastroparesis -Continue current antibiotic regimen of vancomycin and cefepime that the patient recieved -SpO2: 92 % O2 Flow Rate (L/min): 6 L/min -Culture sputum -Aspiration precaution -Speech evaluation -Treatment as Below -The patient will need an Ambulatory  home O2 screen prior to discharge and repeat chest x-ray in the   Sepsis -Secondary to pneumonia, evidenced by leukocytosis, elevated lactate -LA and WBC Trend: Recent Labs  Lab 12/09/22 0215 12/10/22 0326 12/11/22 0554 12/12/22 0411 12/30/22 0800 01/06/23 1332 01/06/23 1540 01/07/23 0145  WBC 9.4 9.7 9.5 7.9 13.9* 20.2*  --  15.0*  LATICACIDVEN  --   --   --   --   --   --    < > 1.3   < > = values in this interval not displayed.  -C/w IVF and received NS at 100 mL/hr x 1 Day -Patient has a urinalysis which showed a cloudy appearance with amber color urine, large hemoglobin, trace leukocytes, negative nitrites, many bacteria, greater than 50 RBCs per high-power field, 0-5 squamous epithelial cells and a WBC of 21-50 -Cultures x 2 pending -Urine culture done and showed No Growth  -Repeat CXR this AM done and showed "Worsening right lung multilobar opacifications since yesterday, now widespread and newly involving the right upper lobe from earlier this month. Left lower lobe consolidation seen earlier this month probably not substantially improved. Possible small pleural effusions. Bronchoscopy might be valuable." -IVF now stopped -C/w Xopenex/Atrovent q6h and add Budesonide and Arfomoterol  -Add Flutter, Incentive, and Guaifenesin  -Will get Pulmonary Evaluation and Recommendations    AKI Metabolic Acidosis -Prerenal likely secondary to volume depletion, likely secondary to repeated nauseous vomiting -BUN/Cr Trend: Recent Labs  Lab 12/10/22 0326 12/11/22 0554 12/12/22 0411 12/30/22 0800 01/06/23 1332 01/06/23 2234 01/07/23 0145  BUN 8 10 12  29* 66* 65* 63*  CREATININE 0.69 0.90 0.94 1.95* 4.20* 3.90*  3.94*  -Given IVF Resuscitation  -Has a CO2 of 17, anion gap of 11, chloride level of 98 -Avoid Nephrotoxic Medications, Contrast Dyes, Hypotension and Dehydration to Ensure Adequate Renal Perfusion and will need to Renally Adjust Meds -Continue to Monitor and Trend Renal  Function carefully and repeat CMP in the AM  -Urine Osm was 253 and Urin Sodium was 13 -Checked renal ultrasound and showed "Normal sonographic appearance of bilateral kidneys and bladder."  Hyponatremia, acute on chronic -With history of SIADH -Now patient is volume contracted, will use normal saline to correct volume status first -Na+ Trend: Recent Labs  Lab 12/10/22 0326 12/11/22 0554 12/12/22 0411 12/30/22 0800 01/06/23 1332 01/06/23 2234 01/07/23 0145  NA 132* 130* 131* 131* 128* 128* 126*  -Probably will need sodium chloride tablet once volume status improved and lactic acid level normalized.   Question of gastroparesis with associated Nausea and Vomiting -Clinically suspected, trial of Reglan every 6 hours -Outpatient GI follow-up for gastric emptying study but may get here if able to get off of Metaclopramide  -Other Ddx, speech evaluation rule out other etiology   COPD -Stable -See above    HTN -Hold off home BP meds including Diovan -Continue to Monitor BP per Protocol    IIDM -Change to SSI while septic patient is now on sensitive NovoLog sliding scale insulin AC -Glucose Trend: Recent Labs  Lab 12/10/22 0326 12/11/22 0554 12/12/22 0411 12/30/22 0800 01/06/23 1332 01/06/23 2234 01/07/23 0145  GLUCOSE 187* 190* 171* 143* 190* 130* 76  -CBG Trend: Recent Labs  Lab 12/13/22 0755 12/13/22 1342 01/06/23 1959 01/07/23 0843 01/07/23 0920 01/07/23 1127 01/07/23 1654  GLUCAP 175* 248* 141* 51* 88 154* 175*   Normocytic Anemia -Hgb/Hct Trend: Recent Labs  Lab 12/10/22 0326 12/11/22 0554 12/12/22 0411 12/30/22 0800 01/06/23 1332 01/07/23 0145 01/07/23 0414  HGB 10.1* 10.8* 10.0* 9.2* 8.7* 6.8* 7.7*  HCT 30.9* 32.7* 30.7* 27.9* 26.6* 20.9* 23.6*  MCV 88.5 89.8 89.0 88.6 88.1 87.4  --   -Check Anemia Panel in the AM -Continue to Monitor and Trend for S/Sx of Bleeding; No overt bleeding noted -Repeat CBC in the AM   Thrombocytosis -Likely  Reactive -Patients Platelet Count Trend: Recent Labs  Lab 12/09/22 0215 12/10/22 0326 12/11/22 0554 12/12/22 0411 12/30/22 0800 01/06/23 1332 01/07/23 0145  PLT 333 416* 529* 531* 248 587* 427*  -Continue to Monitor and Trend and repeat CMP in the AM  GERD/GI Prophylaxis -C/w Pantoprazole 40 mg po Daily   DVT prophylaxis: heparin injection 5,000 Units Start: 01/06/23 2200    Code Status: Full Code Family Communication: Discussed with his family at bedside including his wife and daughter over the telephone  Disposition Plan:  Level of care: Telemetry Medical Status is: Inpatient Remains inpatient appropriate because: Needs further clinical improvement and evaluation by PT/OT   Consultants:  Pulmonary  Procedures:  As delineated as above  Antimicrobials:  Anti-infectives (From admission, onward)    Start     Dose/Rate Route Frequency Ordered Stop   01/07/23 1215  vancomycin (VANCOCIN) IVPB 1000 mg/200 mL premix        1,000 mg 200 mL/hr over 60 Minutes Intravenous  Once 01/07/23 1117 01/07/23 1343   01/06/23 2200  linezolid (ZYVOX) tablet 600 mg  Status:  Discontinued        600 mg Oral Every 12 hours 01/06/23 1657 01/06/23 1658   01/06/23 1530  vancomycin (VANCOCIN) IVPB 1000 mg/200 mL premix  Status:  Discontinued  1,000 mg 200 mL/hr over 60 Minutes Intravenous  Once 01/06/23 1522 01/06/23 1523   01/06/23 1530  ceFEPIme (MAXIPIME) 2 g in sodium chloride 0.9 % 100 mL IVPB  Status:  Discontinued        2 g 200 mL/hr over 30 Minutes Intravenous  Once 01/06/23 1522 01/06/23 1523   01/06/23 1530  azithromycin (ZITHROMAX) 500 mg in sodium chloride 0.9 % 250 mL IVPB        500 mg 250 mL/hr over 60 Minutes Intravenous  Once 01/06/23 1522 01/06/23 1739   01/06/23 1530  ceFEPIme (MAXIPIME) 2 g in sodium chloride 0.9 % 100 mL IVPB        2 g 200 mL/hr over 30 Minutes Intravenous Every 24 hours 01/06/23 1523     01/06/23 1530  vancomycin (VANCOREADY) IVPB 1500 mg/300  mL        1,500 mg 150 mL/hr over 120 Minutes Intravenous  Once 01/06/23 1523 01/07/23 0014   01/06/23 1529  vancomycin variable dose per unstable renal function (pharmacist dosing)         Does not apply See admin instructions 01/06/23 1529         Subjective: Seen And examined at bedside and thinks he is doing okay and was short of breath this morning.  Feels better now.  Denies any lightheadedness or dizziness.  Objective: Vitals:   01/07/23 0925 01/07/23 1536 01/07/23 1540 01/07/23 1728  BP:    125/66  Pulse: 94   (!) 101  Resp: 18   20  Temp:    98.2 F (36.8 C)  TempSrc:    Oral  SpO2: 95% 94% 92% 92%  Weight:      Height:        Intake/Output Summary (Last 24 hours) at 01/07/2023 1753 Last data filed at 01/07/2023 1507 Gross per 24 hour  Intake 2862.65 ml  Output 550 ml  Net 2312.65 ml   Filed Weights   01/06/23 1322  Weight: 71.7 kg   Examination: Physical Exam:  Constitutional: Elderly overweight Caucasian male in no acute distress Respiratory: Diminished to auscultation bilaterally with coarse breath sounds to have some rhonchi but no appreciable wheezing, rales or crackles. Normal respiratory effort and patient is not tachypenic. No accessory muscle use.  Unlabored breathing but is wearing supplemental oxygen via nasal cannula at 6 L Cardiovascular: RRR, no murmurs / rubs / gallops. S1 and S2 auscultated.  Mild extremity edema Abdomen: Soft, non-tender, non-distended. Bowel sounds positive.  GU: Deferred. Musculoskeletal: No clubbing / cyanosis of digits/nails. No joint deformity upper and lower extremities.  Skin: No rashes, lesions, ulcers on limited skin evaluation. No induration; Warm and dry.  Neurologic: CN 2-12 grossly intact with no focal deficits. Romberg sign cerebellar reflexes not assessed.  Psychiatric: Normal judgment and insight. Alert and oriented x 3. Normal mood and appropriate affect.   Data Reviewed: I have personally reviewed following  labs and imaging studies  CBC: Recent Labs  Lab 01/06/23 1332 01/07/23 0145 01/07/23 0414  WBC 20.2* 15.0*  --   NEUTROABS 18.1*  --   --   HGB 8.7* 6.8* 7.7*  HCT 26.6* 20.9* 23.6*  MCV 88.1 87.4  --   PLT 587* 427*  --    Basic Metabolic Panel: Recent Labs  Lab 01/06/23 1332 01/06/23 2234 01/07/23 0145  NA 128* 128* 126*  K 3.9 3.8 3.8  CL 93* 94* 98  CO2 19* 18* 17*  GLUCOSE 190* 130* 76  BUN 66*  65* 63*  CREATININE 4.20* 3.90* 3.94*  CALCIUM 7.8* 7.7* 7.1*   GFR: Estimated Creatinine Clearance: 13.2 mL/min (A) (by C-G formula based on SCr of 3.94 mg/dL (H)). Liver Function Tests: No results for input(s): "AST", "ALT", "ALKPHOS", "BILITOT", "PROT", "ALBUMIN" in the last 168 hours. No results for input(s): "LIPASE", "AMYLASE" in the last 168 hours. No results for input(s): "AMMONIA" in the last 168 hours. Coagulation Profile: Recent Labs  Lab 01/06/23 1540  INR 1.2   Cardiac Enzymes: No results for input(s): "CKTOTAL", "CKMB", "CKMBINDEX", "TROPONINI" in the last 168 hours. BNP (last 3 results) No results for input(s): "PROBNP" in the last 8760 hours. HbA1C: No results for input(s): "HGBA1C" in the last 72 hours. CBG: Recent Labs  Lab 01/06/23 1959 01/07/23 0843 01/07/23 0920 01/07/23 1127 01/07/23 1654  GLUCAP 141* 51* 88 154* 175*   Lipid Profile: No results for input(s): "CHOL", "HDL", "LDLCALC", "TRIG", "CHOLHDL", "LDLDIRECT" in the last 72 hours. Thyroid Function Tests: No results for input(s): "TSH", "T4TOTAL", "FREET4", "T3FREE", "THYROIDAB" in the last 72 hours. Anemia Panel: No results for input(s): "VITAMINB12", "FOLATE", "FERRITIN", "TIBC", "IRON", "RETICCTPCT" in the last 72 hours. Sepsis Labs: Recent Labs  Lab 01/06/23 1540 01/06/23 1758 01/06/23 2303 01/07/23 0145  LATICACIDVEN 2.7* 3.7* 2.8* 1.3    Recent Results (from the past 240 hour(s))  Blood Culture (routine x 2)     Status: None (Preliminary result)   Collection Time:  01/06/23  3:40 PM   Specimen: BLOOD  Result Value Ref Range Status   Specimen Description BLOOD RIGHT ANTECUBITAL  Final   Special Requests   Final    BOTTLES DRAWN AEROBIC AND ANAEROBIC Blood Culture adequate volume   Culture   Final    NO GROWTH < 24 HOURS Performed at [facility], [address] [place], [place] [address]    Report Status PENDING  Incomplete  Blood Culture (routine x 2)     Status: None (Preliminary result)   Collection Time: 01/06/23  3:40 PM   Specimen: BLOOD  Result Value Ref Range Status   Specimen Description BLOOD LEFT ANTECUBITAL  Final   Special Requests   Final    BOTTLES DRAWN AEROBIC AND ANAEROBIC Blood Culture adequate volume   Culture   Final    NO GROWTH < 24 HOURS Performed at [facility], [address] [place], [place] [address]    Report Status PENDING  Incomplete  Resp panel by RT-PCR (RSV, Flu A&B, Covid) Anterior Nasal Swab     Status: None   Collection Time: 01/06/23  4:18 PM   Specimen: Anterior Nasal Swab  Result Value Ref Range Status   SARS Coronavirus 2 by RT PCR NEGATIVE NEGATIVE Final   Influenza A by PCR NEGATIVE NEGATIVE Final   Influenza B by PCR NEGATIVE NEGATIVE Final    Comment: (NOTE) The Xpert Xpress SARS-CoV-2/FLU/RSV plus assay is intended as an aid in the diagnosis of influenza from Nasopharyngeal swab specimens and should not be used as a sole basis for treatment. Nasal washings and aspirates are unacceptable for Xpert Xpress SARS-CoV-2/FLU/RSV testing.  Fact Sheet for Patients: BloggerCourse.com  Fact Sheet for Healthcare Providers: SeriousBroker.it  This test is not yet approved or cleared by the [place] FDA and has been authorized for detection and/or diagnosis of SARS-CoV-2 by FDA under an Emergency Use Authorization (EUA). This EUA will remain in effect (meaning this test can be used) for the duration of the COVID-19 declaration under  Section  564(b)(1) of the Act, 21 U.S.C. section 360bbb-3(b)(1), unless the authorization is terminated or revoked.     Resp Syncytial Virus by PCR NEGATIVE NEGATIVE Final    Comment: (NOTE) Fact Sheet for Patients: BloggerCourse.com  Fact Sheet for Healthcare Providers: SeriousBroker.it  This test is not yet approved or cleared by the [place] FDA and has been authorized for detection and/or diagnosis of SARS-CoV-2 by FDA under an Emergency Use Authorization (EUA). This EUA will remain in effect (meaning this test can be used) for the duration of the COVID-19 declaration under Section 564(b)(1) of the Act, 21 U.S.C. section 360bbb-3(b)(1), unless the authorization is terminated or revoked.  Performed at [facility], [address] [place], [place] [address]   Urine Culture     Status: None   Collection Time: 01/06/23  4:18 PM   Specimen: Urine, Random  Result Value Ref Range Status   Specimen Description URINE, RANDOM  Final   Special Requests URINE, CLEAN CATCH  Final   Culture   Final    NO GROWTH Performed at [facility], [address] [place], [place] [address]    Report Status 01/07/2023 FINAL  Final     Radiology Studies: DG Swallowing Func-Speech Pathology  Result Date: 01/07/2023 Table formatting from the original result was not included. Modified Barium Swallow Study Patient Details Name: Travis Beck MRN: [id number] Date of Birth: 1945/11/20 Today's Date: 01/07/2023 HPI/PMH: HPI: Patient is a 77 y.o. male with PMH: HTN, DM-2, COPD, recurrent PNA, esophageal dysphagia (has had EGD's with dilation in 2020 and most recently in 2022). He presented to the hospital on 01/06/2023 with nausea, comiting, cough and SOB. He was recently admitted and discharged back home three weeks ago for multifocal PNA. He presented to [facility] 12/29/22 with nausea, vomiting and GI cough; symptoms improved but have returned.  Clinical Impression: Clinical Impression: Patient presents with a normal oral phase of swallow and a pharyngeal phase that is largely [facility]. His dysphagia appears to be primarily esophageal. During pharyngeal phase, initiation of swallow with thin liquids was delayed at level of vallecular sinus and at times at level of pyriform sinus. In addition, trace amount of thin liquid barium entered laryngeal vestibule but remained well above the vocal cords and then fully exited. No significant pharyngeal residuals observed post swallows. Epiglottic inversion, anterior hyoid excursion and laryngeal elevation all appeared to be complete.13mm barium tablet transited through PES without difficulty but when esophageal sweep performed, barium stasis, including tablet were observed in distal esophagus. Towards end of study, mild amount of barium observed in upper esophagus below cricopharyngeal bar. No retrograde movement of barium observed. SLP is not recommending further skilled services but is recommending patient f/u with GI for ongoing esophageal dysphagia management. Factors that may increase risk of adverse event in presence of aspiration Rubye Oaks & Clearance Coots 2021): Factors that may increase risk of adverse event in presence of aspiration Rubye Oaks & Clearance Coots 2021): Poor general health and/or compromised immunity; Frail or deconditioned Recommendations/Plan: Swallowing Evaluation Recommendations Swallowing Evaluation Recommendations Recommendations: PO diet PO Diet Recommendation: Regular; Thin liquids (Level 0) Liquid Administration via: Cup; Straw Medication Administration: Whole meds with liquid Supervision: Patient able to self-feed Swallowing strategies  : Slow rate; Small bites/sips Postural changes: Stay upright 30-60 min after meals; Position pt fully upright for meals Oral care recommendations: Oral care BID (2x/day); Pt independent with oral care Treatment Plan Treatment Plan Treatment recommendations: No treatment  recommended at this time Follow-up recommendations: No  SLP follow up Functional status assessment: Patient has not had a recent decline in their functional status. Recommendations Recommendations for follow up therapy are one component of a multi-disciplinary discharge planning process, led by the attending physician.  Recommendations may be updated based on patient status, additional functional criteria and insurance authorization. Assessment: Orofacial Exam: Orofacial Exam Oral Cavity: Oral Hygiene: WFL Oral Cavity - Dentition: Adequate natural dentition Anatomy: Anatomy: Prominent cricopharyngeus Boluses Administered: Boluses Administered Boluses Administered: Thin liquids (Level 0); Mildly thick liquids (Level 2, nectar thick); Moderately thick liquids (Level 3, honey thick); Solid; Puree  Oral Impairment Domain: Oral Impairment Domain Lip Closure: No labial escape Tongue control during bolus hold: Not tested Bolus preparation/mastication: Timely and efficient chewing and mashing Bolus transport/lingual motion: Brisk tongue motion Oral residue: Complete oral clearance Location of oral residue : N/A Initiation of pharyngeal swallow : Valleculae; Pyriform sinuses  Pharyngeal Impairment Domain: Pharyngeal Impairment Domain Soft palate elevation: No bolus between soft palate (SP)/pharyngeal wall (PW) Laryngeal elevation: Complete superior movement of thyroid cartilage with complete approximation of arytenoids to epiglottic petiole Anterior hyoid excursion: Complete anterior movement Epiglottic movement: Complete inversion Laryngeal vestibule closure: Incomplete, narrow column air/contrast in laryngeal vestibule Pharyngeal stripping wave : Present - complete Pharyngeal contraction (A/P view only): N/A Pharyngoesophageal segment opening: Complete distension and complete duration, no obstruction of flow Tongue base retraction: No contrast between tongue base and posterior pharyngeal wall (PPW) Pharyngeal residue: Trace  residue within or on pharyngeal structures Location of pharyngeal residue: Valleculae  Esophageal Impairment Domain: Esophageal Impairment Domain Esophageal clearance upright position: Esophageal retention with retrograde flow below pharyngoesophageal segment (PES) Pill: Pill Consistency administered: Thin liquids (Level 0) Thin liquids (Level 0): [facility] Penetration/Aspiration Scale Score: Penetration/Aspiration Scale Score 1.  Material does not enter airway: Mildly thick liquids (Level 2, nectar thick); Moderately thick liquids (Level 3, honey thick); Puree; Solid; Pill 2.  Material enters airway, remains ABOVE vocal cords then ejected out: Thin liquids (Level 0) Compensatory Strategies: Compensatory Strategies Compensatory strategies: No   General Information: Caregiver present: No  Diet Prior to this Study: Regular; Thin liquids (Level 0)   Temperature : Normal   Respiratory Status: WFL   Supplemental O2: None (Room air)   History of Recent Intubation: No  Behavior/Cognition: Alert; Cooperative; Pleasant mood Self-Feeding Abilities: Able to self-feed Baseline vocal quality/speech: Normal Volitional Cough: Able to elicit Volitional Swallow: Able to elicit Exam Limitations: No limitations Goal Planning: Prognosis for improved oropharyngeal function: Good No data recorded No data recorded Patient/Family Stated Goal: nothing specific Consulted and agree with results and recommendations: Patient Pain: Pain Assessment Pain Assessment: No/denies pain End of Session: Start Time:SLP Start Time (ACUTE ONLY): 1105 Stop Time: SLP Stop Time (ACUTE ONLY): 1115 Time Calculation:SLP Time Calculation (min) (ACUTE ONLY): 10 min Charges: SLP Evaluations $ SLP Speech Visit: 1 Visit SLP Evaluations $BSS Swallow: 1 Procedure SLP visit diagnosis: SLP Visit Diagnosis: Dysphagia, pharyngoesophageal phase (R13.14) Past Medical History: Past Medical History: Diagnosis Date  Arthritis   Asthma   Childhood  Emphysema lung (HCC)   Essential  hypertension   Ischemic heart disease   Myoview 2020 indicating inferior infarct scar with mild peri-infarct ischemia - managed medically  Type 2 diabetes mellitus (HCC)  Past Surgical History: Past Surgical History: Procedure Laterality Date  BIOPSY  08/30/2018  Procedure: BIOPSY;  Surgeon: Malissa Hippo, MD;  [facility];  Service: Endoscopy;;  gastric   BIOPSY  11/05/2020  Procedure: BIOPSY;  Surgeon: Dolores Frame, MD;  [facility];  Service: Gastroenterology;;  COLONOSCOPY    COLONOSCOPY WITH PROPOFOL N/A 11/05/2020  Procedure: COLONOSCOPY WITH PROPOFOL;  Surgeon: Dolores Frame, MD;  [facility];  Service: Gastroenterology;  Laterality: N/A;  Am  ESOPHAGEAL DILATION N/A 05/21/2019  Procedure: ESOPHAGEAL DILATION;  Surgeon: Malissa Hippo, MD;  [facility];  Service: Endoscopy;  Laterality: N/A;  ESOPHAGOGASTRODUODENOSCOPY N/A 05/21/2019  Procedure: ESOPHAGOGASTRODUODENOSCOPY (EGD);  Surgeon: Malissa Hippo, MD;  [facility];  Service: Endoscopy;  Laterality: N/A;  730  ESOPHAGOGASTRODUODENOSCOPY (EGD) WITH PROPOFOL N/A 08/30/2018  Procedure: ESOPHAGOGASTRODUODENOSCOPY (EGD) WITH PROPOFOL;  Surgeon: Malissa Hippo, MD;  [facility];  Service: Endoscopy;  Laterality: N/A;  1:30  ESOPHAGOGASTRODUODENOSCOPY (EGD) WITH PROPOFOL N/A 11/05/2020  Procedure: ESOPHAGOGASTRODUODENOSCOPY (EGD) WITH PROPOFOL;  Surgeon: Dolores Frame, MD;  [facility];  Service: Gastroenterology;  Laterality: N/A;  HERNIA REPAIR    bilateral lower abdomen  POLYPECTOMY  11/05/2020  Procedure: POLYPECTOMY;  Surgeon: Dolores Frame, MD;  [facility];  Service: Gastroenterology;;  Gaspar Bidding DILATION  11/05/2020  Procedure: Gaspar Bidding DILATION;  Surgeon: Dolores Frame, MD;  [facility];  Service: Gastroenterology;; Pablo Lawrence 01/07/2023, 2:05 PM Angela Nevin, MA, CCC-SLP  Speech Therapy  DG CHEST PORT 1 VIEW  Result Date: 01/07/2023 CLINICAL DATA:  77 year old male recently diagnosed with pneumonia, left greater than right lower lobe consolidation earlier this month on CT. New right lung abnormality on chest x-ray yesterday. EXAM: PORTABLE CHEST 1 VIEW COMPARISON:  01/06/2023 and earlier. FINDINGS: Portable AP semi upright view at 0855 hours. Increasing confluence of multilobar right lung opacity since yesterday. New involvement of the right upper lobe since the CT earlier this month. Regressed but probably not completely resolved left lower lobe consolidation since the CT. Difficult to exclude small pleural effusions. No pneumothorax or pulmonary edema. Mediastinal contours appear stable, within normal limits. No acute osseous abnormality identified. Negative visible bowel gas. IMPRESSION: 1. Worsening right lung multilobar opacifications since yesterday, now widespread and newly involving the right upper lobe from earlier this month. 2. Left lower lobe consolidation seen earlier this month probably not substantially improved. 3. Possible small pleural effusions. 4. Bronchoscopy might be valuable. Electronically Signed   By: Odessa Fleming M.D.   On: 01/07/2023 09:02   US RENAL  Result Date: 01/06/2023 CLINICAL DATA:  AKI EXAM: RENAL / URINARY TRACT ULTRASOUND COMPLETE COMPARISON:  CT renal stone protocol Nov 30, 2022 FINDINGS: Right Kidney: Renal measurements: 12.5 x 6.1 x 5.1 cm = volume: 203 mL. Echogenicity within normal limits. No hydronephrosis. There is a 3.8 x 2.8 x 3.0 cm benign simple cyst in the inferior pole of the right kidney. Left Kidney: Renal measurements: 12.1 x 7.0 x 5.3 cm = volume: 222 mL. Echogenicity within normal limits. No hydronephrosis. There is a 3.5 x 2.5 x 2.8 cm benign simple cyst in the inferior pole of the left kidney. Bladder: Appears normal for degree of bladder distention. Other: None. IMPRESSION: Normal sonographic appearance of bilateral  kidneys and bladder. Electronically Signed   By: Jacob Moores M.D.   On: 01/06/2023 19:57   DG Chest 2 View  Result Date: 01/06/2023 CLINICAL DATA:  Cough, recent pneumonia EXAM: CHEST - 2 VIEW COMPARISON:  Chest x-ray December 30, 2022 FINDINGS: The cardiomediastinal silhouette is unchanged in contour. Multifocal heterogeneous pulmonary opacities involving the majority of the right lung. There is an additional small patchy pulmonary opacity  in the left midlung. No pleural effusion or pneumothorax. The visualized upper abdomen is unremarkable. No acute osseous abnormality. IMPRESSION: Near diffuse heterogeneous pulmonary opacities involving the right lung and small focal pulmonary opacity in the left lower lobe. Overall, findings are concerning for multifocal infection. Electronically Signed   By: Jacob Moores M.D.   On: 01/06/2023 14:35    Scheduled Meds:  arformoterol  15 mcg Nebulization BID   aspirin EC  81 mg Oral Daily   budesonide (PULMICORT) nebulizer solution  0.25 mg Nebulization BID   buprenorphine  8 mg Sublingual Daily   And   buprenorphine  4 mg Sublingual QHS   famotidine  20 mg Oral Daily   [START ON 01/08/2023] feeding supplement (GLUCERNA SHAKE)  237 mL Oral BID BM   fluticasone  2 spray Each Nare Daily   guaiFENesin  1,200 mg Oral BID   heparin  5,000 Units Subcutaneous Q12H   insulin aspart  0-9 Units Subcutaneous TID WC   ipratropium  0.5 mg Nebulization Q6H   levalbuterol  0.63 mg Nebulization Q6H   loratadine  10 mg Oral Daily   metoCLOPramide (REGLAN) injection  10 mg Intravenous Q6H   montelukast  10 mg Oral QHS   multivitamin  1 tablet Oral QHS   pantoprazole  40 mg Oral Daily   pramipexole  0.5 mg Oral QHS   pravastatin  20 mg Oral QPM   vancomycin variable dose per unstable renal function (pharmacist dosing)   Does not apply See admin instructions   Continuous Infusions:  ceFEPime (MAXIPIME) IV 2 g (01/07/23 1459)    LOS: 1 day   Marguerita Merles,  DO [facility] Available via Epic secure chat 7am-7pm After these hours, please refer to coverage provider listed on [email or web address] 01/07/2023, 5:53 PM

## 2023-01-07 NOTE — Progress Notes (Signed)
Pharmacy Antibiotic Note- follow-up  Travis Beck is a 77 y.o. male admitted on 01/06/2023 with pneumonia.  Pharmacy has been consulted for Cefepime and Vancomycin dosing.  Initial assessment (6/29): WBC 20.2, afebrile SCr 4.20 (baseline SCr ~0.7) MRSA PCR positive on 11/30/22 and 12/07/22 Patient recently took linezolid 600mg  po BID x 7 days from 12/13/22>12/19/22  6/30 AM update: Vanco random 17 mcg/mL @ 10:05  Plan: Give 1 gram iv vanco now- dosing per unstable RF- variable doses F/up vanco random AM labs Continue Cefepime 2g IV q24h Monitor daily CBC, temp, SCr, and for clinical signs of improvement  F/u cultures and de-escalate antibiotics as able   Height: 5\' 5"  (165.1 cm) Weight: 71.7 kg (158 lb) IBW/kg (Calculated) : 61.5  Temp (24hrs), Avg:98.1 F (36.7 C), Min:97.5 F (36.4 C), Max:99.6 F (37.6 C)  Recent Labs  Lab 01/06/23 1332 01/06/23 1540 01/06/23 1758 01/06/23 2234 01/06/23 2303 01/07/23 0145 01/07/23 1005  WBC 20.2*  --   --   --   --  15.0*  --   CREATININE 4.20*  --   --  3.90*  --  3.94*  --   LATICACIDVEN  --  2.7* 3.7*  --  2.8* 1.3  --   VANCORANDOM  --   --   --   --   --   --  17     Estimated Creatinine Clearance: 13.2 mL/min (A) (by C-G formula based on SCr of 3.94 mg/dL (H)).    Allergies  Allergen Reactions   Penicillins Rash    No problems with ampicillin during hospitalization    Antimicrobials this admission: Cefepime 6/29 >>  Azithromycin 6/29 x1 dose Vancomycin 6/29 >>   Dose adjustments this admission: N/A  Microbiology results: 6/29 BCx: ngtd<12 6/29 ucx- ip  Thank you for allowing pharmacy to be a part of this patient's care.  Greta Doom BS, PharmD, BCPS Clinical Pharmacist 01/07/2023 11:20 AM  Contact: [phone] after 3 PM  "Be curious, not judgmental..." -Debbora Dus

## 2023-01-07 NOTE — Procedures (Signed)
Modified Barium Swallow Study  Patient Details  Name: Travis Beck MRN: [id number] Date of Birth: 12/06/1945  Today's Date: 01/07/2023  Modified Barium Swallow completed.  Full report located under Chart Review in the Imaging Section.  History of Present Illness Patient is a 77 y.o. male with PMH: HTN, DM-2, COPD, recurrent PNA, esophageal dysphagia (has had EGD's with dilation in 2020 and most recently in 2022). He presented to the hospital on 01/06/2023 with nausea, comiting, cough and SOB. He was recently admitted and discharged back home three weeks ago for multifocal PNA. He presented to [facility]/29/22 with nausea, vomiting and GI cough; symptoms improved but have returned.   Clinical Impression Patient presents with a normal oral phase of swallow and a pharyngeal phase that is largely [facility]. His dysphagia appears to be primarily esophageal. During pharyngeal phase, initiation of swallow with thin liquids was delayed at level of vallecular sinus and at times at level of pyriform sinus. In addition, trace amount of thin liquid barium entered laryngeal vestibule but remained well above the vocal cords and then fully exited. No significant pharyngeal residuals observed post swallows. Epiglottic inversion, anterior hyoid excursion and laryngeal elevation all appeared to be complete.13mm barium tablet transited through PES without difficulty but when esophageal sweep performed, barium stasis, including tablet were observed in distal esophagus. Towards end of study, mild amount of barium observed in upper esophagus below cricopharyngeal bar. No retrograde movement of barium observed. SLP is not recommending further skilled services but is recommending patient f/u with GI for ongoing esophageal dysphagia management.  Factors that may increase risk of adverse event in presence of aspiration Rubye Oaks [employer] Clearance Coots 2021): Poor general health and/or compromised immunity;Frail or deconditioned  Swallow  Evaluation Recommendations Recommendations: PO diet PO Diet Recommendation: Regular;Thin liquids (Level 0) Liquid Administration via: Cup;Straw Medication Administration: Whole meds with liquid Supervision: Patient able to self-feed Swallowing strategies  : Slow rate;Small bites/sips Postural changes: Stay upright 30-60 min after meals;Position pt fully upright for meals Oral care recommendations: Oral care BID (2x/day);Pt independent with oral care      Angela Nevin, MA, CCC-SLP Speech Therapy

## 2023-01-07 NOTE — Hospital Course (Signed)
Is a 77 year old Caucasian male with a past medical history significant for but limited to diabetes mellitus type 2, hypertension, SIADH, COPD, diabetic neuropathy, recurrent pneumonia as well as other comorbidities who presented with worsening nausea, vomiting and cough as well as shortness of breath.  He is recently hospitalized for multifocal pneumonia and septic shock with MRSA pneumonia and discharged on oral Zyvox and discharged 3 weeks ago to home.  Initially his breathing symptoms improved with Zyvox however he continued to have frequent nauseous and vomiting and coughing episodes.  He described that he felt like food or water did not get stuck in his chest and about 10 to 20 minutes after he eats or drinks that he feels nauseous and throwing up undigested food and he has been throwing up for the last few hours even with clear liquids.  He went to the [facility] ED with his concerns of nausea vomiting and cough and was given IV fluid and symptomatic treatment and discharged home with Zofran and GI cocktail.  Over the next few days his symptoms were better however his nausea vomiting and subsequently became worse so he came back to the ED for further evaluation.  He was admitted for sepsis and found to have a recurrent aspiration and recurrent multifocal pneumonia along with an AKI.  His nausea vomiting is improved and he is given symptomatic treatment.  His respiratory status continued to worsen throughout the day and he decompensated later afternoon and had to be moved to the intensive care unit given that he is at very high risk for being intubated.  GI was consulted and he was made n.p.o. and a cortrak was placed given his significant esophageal dysmotility.  Assessment and Plan:  Aspiration pneumonia Recurrent multifocal pneumonia Acute Respiratory Failure with Hypoxia, worsened  -Suspect patient has underlying gastroparesis but SLP evaluation done and MBSS done and did show suggest  esophageal dysmotility with barium stasis including the distal esophagus  -Continue current antibiotic regimen of vancomycin and cefepime that the patient recieved -SpO2: 98 % O2 Flow Rate (L/min): 60 L/min FiO2 (%): 100 % -Culture sputum -Aspiration precaution -Treatment as Below -The patient will need an Ambulatory home O2 screen prior to discharge and pneumonia looks like it has worsened on x-ray patient clinically decompensated so he was moved to the intensive care unit for further monitoring given that he is a very high risk for intubation   Sepsis -Secondary to pneumonia, evidenced by leukocytosis, elevated lactate -LA and WBC Trend: Recent Labs  Lab 12/12/22 0411 12/30/22 0800 01/06/23 1332 01/06/23 1540 01/07/23 0145 01/08/23 0407 01/08/23 1148 01/08/23 1622  WBC 7.9 13.9* 20.2*  --  15.0* 14.9* 14.9* 17.2*  LATICACIDVEN  --   --   --    < > 1.3  --   --   --    < > = values in this interval not displayed.  -C/w IVF and received NS at 100 mL/hr x 1 Day and now a dose of IV Lasix given that he had to be given some blood -Patient has a urinalysis which showed a cloudy appearance with amber color urine, large hemoglobin, trace leukocytes, negative nitrites, many bacteria, greater than 50 RBCs per high-power field, 0-5 squamous epithelial cells and a WBC of 21-50 -Cultures x 2 pending -Urine culture done and showed No Growth  -Repeat CXR yesterday AM done and showed "Worsening right lung multilobar opacifications since yesterday, now widespread and newly involving the right upper lobe from earlier this  month. Left lower lobe consolidation seen earlier this month probably not substantially improved. Possible small pleural effusions. Bronchoscopy might be valuable." -CXR done today and showed " -IVF now stopped -C/w Xopenex/Atrovent q6h and add Budesonide and Arfomoterol  -Add Flutter, Incentive, and Guaifenesin  -Will get Pulmonary Evaluation and Recommendations and he  subsequently decompensated and has now been moved to the ICU given he is a high risk for being intubated   AKI Metabolic Acidosis -Prerenal likely secondary to volume depletion, likely secondary to repeated nauseous vomiting -BUN/Cr Trend: Recent Labs  Lab 12/12/22 0411 12/30/22 0800 01/06/23 1332 01/06/23 2234 01/07/23 0145 01/08/23 0407 01/08/23 1622  BUN 12 29* 66* 65* 63* 65* 66*  CREATININE 0.94 1.95* 4.20* 3.90* 3.94* 4.19* 4.26*  -Was given IVF Resuscitation and then received a dose of IV Lasix today -Has a CO2 of 17, anion gap of 13, chloride level of 96 -Avoid Nephrotoxic Medications, Contrast Dyes, Hypotension and Dehydration to Ensure Adequate Renal Perfusion and will need to Renally Adjust Meds -Continue to Monitor and Trend Renal Function carefully and repeat CMP in the AM  -Urine Osm was 253 and Urin Sodium was 13 -Checked renal ultrasound and showed "Normal sonographic appearance of bilateral kidneys and bladder."  HypoNatremia, acute on chronic -With history of SIADH -Now patient is volume contracted, will use normal saline to correct volume status first -Na+ Trend: Recent Labs  Lab 12/30/22 0800 01/06/23 1332 01/06/23 2234 01/07/23 0145 01/08/23 0407 01/08/23 1622 01/08/23 1702  NA 131* 128* 128* 126* 129* 126* 126*  -Probably will need sodium chloride tablet once volume status improved and lactic acid level normalized.   Question of gastroparesis with associated Nausea and Vomiting but likely this is in the setting of esophageal dysmotility -Clinically suspected, trial of Reglan every 6 hours -GastroEnterology consult obtained -Other Ddx, speech evaluation rule out other etiology   COPD -Stable -See above    HTN -Hold off home BP meds including Diovan -Continue to Monitor BP per Protocol    IIDM -Change to SSI while septic patient is now on sensitive NovoLog sliding scale insulin AC -Glucose Trend: Recent Labs  Lab 12/12/22 0411  12/30/22 0800 01/06/23 1332 01/06/23 2234 01/07/23 0145 01/08/23 0407 01/08/23 1622  GLUCOSE 171* 143* 190* 130* 76 70 156*  -CBG Trend: Recent Labs  Lab 01/07/23 0920 01/07/23 1127 01/07/23 1654 01/07/23 1951 01/08/23 0802 01/08/23 1211 01/08/23 1641  GLUCAP 88 154* 175* 117* 75 145* 153*   Normocytic Anemia -Hgb/Hct Trend: Recent Labs  Lab 01/06/23 1332 01/07/23 0145 01/07/23 0414 01/08/23 0407 01/08/23 1148 01/08/23 1622 01/08/23 1702  HGB 8.7* 6.8* 7.7* 6.4* 7.9* 10.0* 9.9*  HCT 26.6* 20.9* 23.6* 19.8* 24.0* 29.6* 29.0*  MCV 88.1 87.4  --  86.8 87.6 83.1  --   -Checked Anemia Panel to the patient receiving blood and iron level was less than 5, UIBC was not calculated, TIBC is 136, saturation ratios were not calculated, ferritin level is 186, folate was 16.7 and vitamin B12 was 682 -Continue to Monitor and Trend for S/Sx of Bleeding; No overt bleeding noted -Repeat CBC in the AM   Thrombocytosis -Likely Reactive -Patients Platelet Count Trend: Recent Labs  Lab 12/12/22 0411 12/30/22 0800 01/06/23 1332 01/07/23 0145 01/08/23 0407 01/08/23 1148 01/08/23 1622  PLT 531* 248 587* 427* 649* 557* 611*  -Continue to Monitor and Trend and repeat CMP in the AM  GERD/GI Prophylaxis -C/w Pantoprazole 40 mg po Daily  Hypoalbuminemia -Patient's Albumin Trend: Recent Labs  Lab  12/30/22 0800 01/08/23 0407 01/08/23 1622  ALBUMIN 2.4* <1.5* 1.5*  -Continue to Monitor and Trend and repeat CMP in the AM

## 2023-01-08 ENCOUNTER — Inpatient Hospital Stay (HOSPITAL_COMMUNITY): Payer: Medicare Other

## 2023-01-08 ENCOUNTER — Ambulatory Visit: Payer: Medicare Other | Admitting: Adult Health

## 2023-01-08 DIAGNOSIS — R918 Other nonspecific abnormal finding of lung field: Secondary | ICD-10-CM | POA: Diagnosis not present

## 2023-01-08 DIAGNOSIS — J9601 Acute respiratory failure with hypoxia: Secondary | ICD-10-CM | POA: Diagnosis not present

## 2023-01-08 DIAGNOSIS — E440 Moderate protein-calorie malnutrition: Secondary | ICD-10-CM | POA: Insufficient documentation

## 2023-01-08 DIAGNOSIS — E871 Hypo-osmolality and hyponatremia: Secondary | ICD-10-CM

## 2023-01-08 DIAGNOSIS — R112 Nausea with vomiting, unspecified: Secondary | ICD-10-CM | POA: Diagnosis not present

## 2023-01-08 DIAGNOSIS — J45901 Unspecified asthma with (acute) exacerbation: Secondary | ICD-10-CM

## 2023-01-08 DIAGNOSIS — J441 Chronic obstructive pulmonary disease with (acute) exacerbation: Secondary | ICD-10-CM

## 2023-01-08 DIAGNOSIS — K219 Gastro-esophageal reflux disease without esophagitis: Secondary | ICD-10-CM | POA: Diagnosis not present

## 2023-01-08 DIAGNOSIS — J189 Pneumonia, unspecified organism: Secondary | ICD-10-CM

## 2023-01-08 DIAGNOSIS — D649 Anemia, unspecified: Secondary | ICD-10-CM | POA: Diagnosis not present

## 2023-01-08 DIAGNOSIS — N179 Acute kidney failure, unspecified: Secondary | ICD-10-CM

## 2023-01-08 DIAGNOSIS — R933 Abnormal findings on diagnostic imaging of other parts of digestive tract: Secondary | ICD-10-CM

## 2023-01-08 DIAGNOSIS — J690 Pneumonitis due to inhalation of food and vomit: Secondary | ICD-10-CM | POA: Diagnosis not present

## 2023-01-08 DIAGNOSIS — R1319 Other dysphagia: Secondary | ICD-10-CM

## 2023-01-08 DIAGNOSIS — Z8719 Personal history of other diseases of the digestive system: Secondary | ICD-10-CM

## 2023-01-08 LAB — CULTURE, BLOOD (ROUTINE X 2)
Culture: NO GROWTH
Special Requests: ADEQUATE

## 2023-01-08 LAB — CBC WITH DIFFERENTIAL/PLATELET
Abs Immature Granulocytes: 0.14 10*3/uL — ABNORMAL HIGH (ref 0.00–0.07)
Abs Immature Granulocytes: 0.12 10*3/uL — ABNORMAL HIGH (ref 0.00–0.07)
Basophils Absolute: 0.1 10*3/uL (ref 0.0–0.1)
Basophils Absolute: 0.1 10*3/uL (ref 0.0–0.1)
Basophils Relative: 1 %
Basophils Relative: 1 %
Eosinophils Absolute: 0.3 10*3/uL (ref 0.0–0.5)
Eosinophils Absolute: 0.2 10*3/uL (ref 0.0–0.5)
Eosinophils Relative: 2 %
Eosinophils Relative: 1 %
HCT: 19.8 % — ABNORMAL LOW (ref 39.0–52.0)
HCT: 29.6 % — ABNORMAL LOW (ref 39.0–52.0)
Hemoglobin: 6.4 g/dL — CL (ref 13.0–17.0)
Hemoglobin: 10 g/dL — ABNORMAL LOW (ref 13.0–17.0)
Immature Granulocytes: 1 %
Immature Granulocytes: 1 %
Lymphocytes Relative: 12 %
Lymphocytes Relative: 9 %
Lymphs Abs: 1.8 10*3/uL (ref 0.7–4.0)
Lymphs Abs: 1.5 10*3/uL (ref 0.7–4.0)
MCH: 28.1 pg (ref 26.0–34.0)
MCH: 28.1 pg (ref 26.0–34.0)
MCHC: 32.3 g/dL (ref 30.0–36.0)
MCHC: 33.8 g/dL (ref 30.0–36.0)
MCV: 86.8 fL (ref 80.0–100.0)
MCV: 83.1 fL (ref 80.0–100.0)
Monocytes Absolute: 1.1 10*3/uL — ABNORMAL HIGH (ref 0.1–1.0)
Monocytes Absolute: 0.9 10*3/uL (ref 0.1–1.0)
Monocytes Relative: 7 %
Monocytes Relative: 5 %
Neutro Abs: 11.6 10*3/uL — ABNORMAL HIGH (ref 1.7–7.7)
Neutro Abs: 14.4 10*3/uL — ABNORMAL HIGH (ref 1.7–7.7)
Neutrophils Relative %: 77 %
Neutrophils Relative %: 83 %
Platelets: 649 10*3/uL — ABNORMAL HIGH (ref 150–400)
Platelets: 611 10*3/uL — ABNORMAL HIGH (ref 150–400)
RBC: 2.28 MIL/uL — ABNORMAL LOW (ref 4.22–5.81)
RBC: 3.56 MIL/uL — ABNORMAL LOW (ref 4.22–5.81)
RDW: 14.4 % (ref 11.5–15.5)
RDW: 14.6 % (ref 11.5–15.5)
WBC: 14.9 10*3/uL — ABNORMAL HIGH (ref 4.0–10.5)
WBC: 17.2 10*3/uL — ABNORMAL HIGH (ref 4.0–10.5)
nRBC: 0 % (ref 0.0–0.2)
nRBC: 0 % (ref 0.0–0.2)

## 2023-01-08 LAB — VITAMIN B12: Vitamin B-12: 682 pg/mL (ref 180–914)

## 2023-01-08 LAB — COMPREHENSIVE METABOLIC PANEL
ALT: 16 U/L (ref 0–44)
ALT: 15 U/L (ref 0–44)
AST: 43 U/L — ABNORMAL HIGH (ref 15–41)
AST: 35 U/L (ref 15–41)
Albumin: <1.5 g/dL — ABNORMAL LOW (ref 3.5–5.0)
Albumin: 1.5 g/dL — ABNORMAL LOW (ref 3.5–5.0)
Alkaline Phosphatase: 79 U/L (ref 38–126)
Alkaline Phosphatase: 87 U/L (ref 38–126)
Anion gap: 15 (ref 5–15)
Anion gap: 13 (ref 5–15)
BUN: 65 mg/dL — ABNORMAL HIGH (ref 8–23)
BUN: 66 mg/dL — ABNORMAL HIGH (ref 8–23)
CO2: 19 mmol/L — ABNORMAL LOW (ref 22–32)
CO2: 17 mmol/L — ABNORMAL LOW (ref 22–32)
Calcium: 7.8 mg/dL — ABNORMAL LOW (ref 8.9–10.3)
Calcium: 7.8 mg/dL — ABNORMAL LOW (ref 8.9–10.3)
Chloride: 95 mmol/L — ABNORMAL LOW (ref 98–111)
Chloride: 96 mmol/L — ABNORMAL LOW (ref 98–111)
Creatinine, Ser: 4.19 mg/dL — ABNORMAL HIGH (ref 0.61–1.24)
Creatinine, Ser: 4.26 mg/dL — ABNORMAL HIGH (ref 0.61–1.24)
GFR, Estimated: 14 mL/min — ABNORMAL LOW (ref >60)
GFR, Estimated: 13 mL/min — ABNORMAL LOW (ref >60)
Glucose, Bld: 70 mg/dL (ref 70–99)
Glucose, Bld: 156 mg/dL — ABNORMAL HIGH (ref 70–99)
Potassium: 3.8 mmol/L (ref 3.5–5.1)
Potassium: 3.9 mmol/L (ref 3.5–5.1)
Sodium: 129 mmol/L — ABNORMAL LOW (ref 135–145)
Sodium: 126 mmol/L — ABNORMAL LOW (ref 135–145)
Total Bilirubin: 0.4 mg/dL (ref 0.3–1.2)
Total Bilirubin: 0.5 mg/dL (ref 0.3–1.2)
Total Protein: 6 g/dL — ABNORMAL LOW (ref 6.5–8.1)
Total Protein: 6 g/dL — ABNORMAL LOW (ref 6.5–8.1)

## 2023-01-08 LAB — BLOOD GAS, ARTERIAL
Acid-base deficit: 6.8 mmol/L — ABNORMAL HIGH (ref 0.0–2.0)
Bicarbonate: 17.7 mmol/L — ABNORMAL LOW (ref 20.0–28.0)
Drawn by: 535271
O2 Saturation: 86.5 %
Patient temperature: 37
pCO2 arterial: 32 mmHg (ref 32–48)
pH, Arterial: 7.35 (ref 7.35–7.45)
pO2, Arterial: 52 mmHg — ABNORMAL LOW (ref 83–108)

## 2023-01-08 LAB — POCT I-STAT 7, (LYTES, BLD GAS, ICA,H+H)
Acid-base deficit: 8 mmol/L — ABNORMAL HIGH (ref 0.0–2.0)
Acid-base deficit: 9 mmol/L — ABNORMAL HIGH (ref 0.0–2.0)
Bicarbonate: 16.7 mmol/L — ABNORMAL LOW (ref 20.0–28.0)
Bicarbonate: 19.2 mmol/L — ABNORMAL LOW (ref 20.0–28.0)
Calcium, Ion: 1.12 mmol/L — ABNORMAL LOW (ref 1.15–1.40)
Calcium, Ion: 1.14 mmol/L — ABNORMAL LOW (ref 1.15–1.40)
HCT: 29 % — ABNORMAL LOW (ref 39.0–52.0)
HCT: 29 % — ABNORMAL LOW (ref 39.0–52.0)
Hemoglobin: 9.9 g/dL — ABNORMAL LOW (ref 13.0–17.0)
Hemoglobin: 9.9 g/dL — ABNORMAL LOW (ref 13.0–17.0)
O2 Saturation: 90 %
O2 Saturation: 84 %
Patient temperature: 98.9
Patient temperature: 98.6
Potassium: 4 mmol/L (ref 3.5–5.1)
Potassium: 4 mmol/L (ref 3.5–5.1)
Sodium: 126 mmol/L — ABNORMAL LOW (ref 135–145)
Sodium: 127 mmol/L — ABNORMAL LOW (ref 135–145)
TCO2: 18 mmol/L — ABNORMAL LOW (ref 22–32)
TCO2: 21 mmol/L — ABNORMAL LOW (ref 22–32)
pCO2 arterial: 31.6 mmHg — ABNORMAL LOW (ref 32–48)
pCO2 arterial: 53.9 mmHg — ABNORMAL HIGH (ref 32–48)
pH, Arterial: 7.334 — ABNORMAL LOW (ref 7.35–7.45)
pH, Arterial: 7.159 — CL (ref 7.35–7.45)
pO2, Arterial: 63 mmHg — ABNORMAL LOW (ref 83–108)
pO2, Arterial: 62 mmHg — ABNORMAL LOW (ref 83–108)

## 2023-01-08 LAB — EXPECTORATED SPUTUM ASSESSMENT W GRAM STAIN, RFLX TO RESP C

## 2023-01-08 LAB — RETICULOCYTES
Immature Retic Fract: 15.1 % (ref 2.3–15.9)
RBC.: 2.26 MIL/uL — ABNORMAL LOW (ref 4.22–5.81)
Retic Count, Absolute: 36.8 10*3/uL (ref 19.0–186.0)
Retic Ct Pct: 1.6 % (ref 0.4–3.1)

## 2023-01-08 LAB — IRON AND TIBC
Iron: <5 ug/dL — ABNORMAL LOW (ref 45–182)
TIBC: 136 ug/dL — ABNORMAL LOW (ref 250–450)

## 2023-01-08 LAB — TYPE AND SCREEN: ABO/RH(D): B POS

## 2023-01-08 LAB — MAGNESIUM
Magnesium: 1.7 mg/dL (ref 1.7–2.4)
Magnesium: 2.3 mg/dL (ref 1.7–2.4)

## 2023-01-08 LAB — GLUCOSE, CAPILLARY
Glucose-Capillary: 75 mg/dL (ref 70–99)
Glucose-Capillary: 145 mg/dL — ABNORMAL HIGH (ref 70–99)
Glucose-Capillary: 153 mg/dL — ABNORMAL HIGH (ref 70–99)
Glucose-Capillary: 103 mg/dL — ABNORMAL HIGH (ref 70–99)
Glucose-Capillary: 146 mg/dL — ABNORMAL HIGH (ref 70–99)

## 2023-01-08 LAB — CBC
HCT: 24 % — ABNORMAL LOW (ref 39.0–52.0)
Hemoglobin: 7.9 g/dL — ABNORMAL LOW (ref 13.0–17.0)
MCH: 28.8 pg (ref 26.0–34.0)
MCHC: 32.9 g/dL (ref 30.0–36.0)
MCV: 87.6 fL (ref 80.0–100.0)
Platelets: 557 10*3/uL — ABNORMAL HIGH (ref 150–400)
RBC: 2.74 MIL/uL — ABNORMAL LOW (ref 4.22–5.81)
RDW: 14.6 % (ref 11.5–15.5)
WBC: 14.9 10*3/uL — ABNORMAL HIGH (ref 4.0–10.5)
nRBC: 0 % (ref 0.0–0.2)

## 2023-01-08 LAB — BPAM RBC: Blood Product Expiration Date: 202407102359

## 2023-01-08 LAB — PREPARE RBC (CROSSMATCH)

## 2023-01-08 LAB — MRSA NEXT GEN BY PCR, NASAL: MRSA by PCR Next Gen: NOT DETECTED

## 2023-01-08 LAB — FOLATE: Folate: 16.7 ng/mL (ref >5.9)

## 2023-01-08 LAB — PHOSPHORUS
Phosphorus: 4 mg/dL (ref 2.5–4.6)
Phosphorus: 5.2 mg/dL — ABNORMAL HIGH (ref 2.5–4.6)

## 2023-01-08 LAB — VANCOMYCIN, RANDOM: Vancomycin Rm: 28 ug/mL

## 2023-01-08 LAB — FERRITIN: Ferritin: 186 ng/mL (ref 24–336)

## 2023-01-08 MED ORDER — FENTANYL CITRATE PF 50 MCG/ML IJ SOSY
PREFILLED_SYRINGE | INTRAMUSCULAR | Status: AC
  Administered 2023-01-08: 50 ug via INTRAVENOUS
  Filled 2023-01-08: qty 2

## 2023-01-08 MED ORDER — POLYETHYLENE GLYCOL 3350 17 G PO PACK
17.0000 g | PACK | Freq: Every day | ORAL | Status: DC
  Administered 2023-01-08: 17 g
  Filled 2023-01-08: qty 1

## 2023-01-08 MED ORDER — ALBUTEROL SULFATE (2.5 MG/3ML) 0.083% IN NEBU
2.5000 mg | INHALATION_SOLUTION | RESPIRATORY_TRACT | Status: DC | PRN
  Administered 2023-01-09 – 2023-01-23 (×5): 2.5 mg via RESPIRATORY_TRACT
  Filled 2023-01-08 (×3): qty 3

## 2023-01-08 MED ORDER — MIDAZOLAM HCL 2 MG/2ML IJ SOLN: 2.0000 mg | Freq: Once | INTRAMUSCULAR | Status: AC

## 2023-01-08 MED ORDER — REVEFENACIN 175 MCG/3ML IN SOLN
175.0000 ug | Freq: Every day | RESPIRATORY_TRACT | Status: DC
  Administered 2023-01-09 – 2023-01-24 (×16): 175 ug via RESPIRATORY_TRACT
  Filled 2023-01-08 (×15): qty 3

## 2023-01-08 MED ORDER — FENTANYL CITRATE PF 50 MCG/ML IJ SOSY: 50.0000 ug | PREFILLED_SYRINGE | Freq: Once | INTRAMUSCULAR | Status: AC

## 2023-01-08 MED ORDER — SODIUM BICARBONATE 8.4 % IV SOLN
100.0000 meq | Freq: Once | INTRAVENOUS | Status: AC
  Administered 2023-01-08: 100 meq via INTRAVENOUS
  Filled 2023-01-08: qty 50

## 2023-01-08 MED ORDER — ALBUTEROL SULFATE (2.5 MG/3ML) 0.083% IN NEBU: 2.5000 mg | INHALATION_SOLUTION | RESPIRATORY_TRACT | Status: DC | PRN

## 2023-01-08 MED ORDER — GUAIFENESIN 100 MG/5ML PO LIQD
15.0000 mL | ORAL | Status: DC
  Administered 2023-01-08 – 2023-01-13 (×28): 15 mL
  Filled 2023-01-08 (×29): qty 15

## 2023-01-08 MED ORDER — PANTOPRAZOLE SODIUM 40 MG IV SOLR
40.0000 mg | Freq: Two times a day (BID) | INTRAVENOUS | Status: DC
  Administered 2023-01-08 – 2023-01-12 (×8): 40 mg via INTRAVENOUS
  Filled 2023-01-08 (×9): qty 10

## 2023-01-08 MED ORDER — MIDAZOLAM HCL 2 MG/2ML IJ SOLN
INTRAMUSCULAR | Status: AC
  Administered 2023-01-08: 2 mg via INTRAVENOUS
  Filled 2023-01-08: qty 2

## 2023-01-08 MED ORDER — SACCHAROMYCES BOULARDII 250 MG PO CAPS
250.0000 mg | ORAL_CAPSULE | Freq: Two times a day (BID) | ORAL | Status: DC
  Administered 2023-01-08 – 2023-01-24 (×32): 250 mg
  Filled 2023-01-08 (×35): qty 1

## 2023-01-08 MED ORDER — ACETAMINOPHEN 325 MG PO TABS
650.0000 mg | ORAL_TABLET | Freq: Four times a day (QID) | ORAL | Status: DC | PRN
  Administered 2023-01-10 – 2023-01-22 (×10): 650 mg
  Filled 2023-01-08 (×10): qty 2

## 2023-01-08 MED ORDER — PRAVASTATIN SODIUM 40 MG PO TABS
20.0000 mg | ORAL_TABLET | Freq: Every evening | ORAL | Status: DC
  Administered 2023-01-09 – 2023-01-24 (×16): 20 mg
  Filled 2023-01-08: qty 2
  Filled 2023-01-08 (×3): qty 1
  Filled 2023-01-08 (×5): qty 2
  Filled 2023-01-08: qty 1
  Filled 2023-01-08 (×3): qty 2
  Filled 2023-01-08: qty 1
  Filled 2023-01-08: qty 2
  Filled 2023-01-08 (×2): qty 1

## 2023-01-08 MED ORDER — GABAPENTIN 250 MG/5ML PO SOLN
100.0000 mg | Freq: Three times a day (TID) | ORAL | Status: DC | PRN
  Administered 2023-01-12 – 2023-01-15 (×7): 100 mg
  Filled 2023-01-08 (×11): qty 2

## 2023-01-08 MED ORDER — FUROSEMIDE 10 MG/ML IJ SOLN
40.0000 mg | Freq: Once | INTRAMUSCULAR | Status: AC
  Administered 2023-01-08: 40 mg via INTRAVENOUS
  Filled 2023-01-08: qty 4

## 2023-01-08 MED ORDER — MONTELUKAST SODIUM 10 MG PO TABS
10.0000 mg | ORAL_TABLET | Freq: Every day | ORAL | Status: DC
  Administered 2023-01-08 – 2023-01-24 (×17): 10 mg
  Filled 2023-01-08 (×18): qty 1

## 2023-01-08 MED ORDER — MIDAZOLAM HCL 2 MG/2ML IJ SOLN
INTRAMUSCULAR | Status: AC
  Filled 2023-01-08: qty 2

## 2023-01-08 MED ORDER — ETOMIDATE 2 MG/ML IV SOLN: 20.0000 mg | Freq: Once | INTRAVENOUS | Status: AC

## 2023-01-08 MED ORDER — SODIUM CHLORIDE 3 % IN NEBU
4.0000 mL | INHALATION_SOLUTION | Freq: Two times a day (BID) | RESPIRATORY_TRACT | Status: AC
  Administered 2023-01-08 – 2023-01-11 (×6): 4 mL via RESPIRATORY_TRACT
  Filled 2023-01-08 (×6): qty 4

## 2023-01-08 MED ORDER — FAMOTIDINE IN NACL 20-0.9 MG/50ML-% IV SOLN
20.0000 mg | INTRAVENOUS | Status: DC
  Administered 2023-01-08: 20 mg via INTRAVENOUS
  Filled 2023-01-08 (×2): qty 50

## 2023-01-08 MED ORDER — MAGNESIUM SULFATE 2 GM/50ML IV SOLN
2.0000 g | Freq: Once | INTRAVENOUS | Status: AC
  Administered 2023-01-08: 2 g via INTRAVENOUS
  Filled 2023-01-08: qty 50

## 2023-01-08 MED ORDER — ARFORMOTEROL TARTRATE 15 MCG/2ML IN NEBU: 15.0000 ug | INHALATION_SOLUTION | Freq: Two times a day (BID) | RESPIRATORY_TRACT | Status: DC

## 2023-01-08 MED ORDER — ETOMIDATE 2 MG/ML IV SOLN
INTRAVENOUS | Status: AC
  Administered 2023-01-08: 20 mg via INTRAVENOUS
  Filled 2023-01-08: qty 20

## 2023-01-08 MED ORDER — ROCURONIUM BROMIDE 50 MG/5ML IV SOLN: 100.0000 mg | Freq: Once | INTRAVENOUS | Status: AC

## 2023-01-08 MED ORDER — KETAMINE HCL 50 MG/5ML IJ SOSY
PREFILLED_SYRINGE | INTRAMUSCULAR | Status: AC
  Filled 2023-01-08: qty 10

## 2023-01-08 MED ORDER — DOCUSATE SODIUM 50 MG/5ML PO LIQD
100.0000 mg | Freq: Two times a day (BID) | ORAL | Status: DC
  Administered 2023-01-08 – 2023-01-09 (×3): 100 mg
  Filled 2023-01-08 (×3): qty 10

## 2023-01-08 MED ORDER — ASPIRIN 81 MG PO CHEW
81.0000 mg | CHEWABLE_TABLET | Freq: Every day | ORAL | Status: DC
  Administered 2023-01-09 – 2023-01-23 (×15): 81 mg
  Filled 2023-01-08 (×15): qty 1

## 2023-01-08 MED ORDER — FENTANYL BOLUS VIA INFUSION
25.0000 ug | INTRAVENOUS | Status: DC | PRN
  Administered 2023-01-09: 25 ug via INTRAVENOUS

## 2023-01-08 MED ORDER — BUDESONIDE 0.5 MG/2ML IN SUSP
0.5000 mg | Freq: Two times a day (BID) | RESPIRATORY_TRACT | Status: DC
  Administered 2023-01-08 – 2023-01-24 (×33): 0.5 mg via RESPIRATORY_TRACT
  Filled 2023-01-08 (×34): qty 2

## 2023-01-08 MED ORDER — ALPRAZOLAM 0.5 MG PO TABS: 1.0000 mg | ORAL_TABLET | Freq: Three times a day (TID) | ORAL | Status: DC | PRN

## 2023-01-08 MED ORDER — BUPRENORPHINE HCL 8 MG SL SUBL: 4.0000 mg | SUBLINGUAL_TABLET | Freq: Every evening | SUBLINGUAL | Status: DC | PRN

## 2023-01-08 MED ORDER — LORATADINE 10 MG PO TABS
10.0000 mg | ORAL_TABLET | Freq: Every day | ORAL | Status: DC
  Administered 2023-01-09 – 2023-01-12 (×3): 10 mg
  Filled 2023-01-08 (×3): qty 1

## 2023-01-08 MED ORDER — FENTANYL 2500MCG IN NS 250ML (10MCG/ML) PREMIX INFUSION
INTRAVENOUS | Status: AC
  Administered 2023-01-08: 10 ug/h
  Filled 2023-01-08: qty 250

## 2023-01-08 MED ORDER — ROCURONIUM BROMIDE 10 MG/ML (PF) SYRINGE
PREFILLED_SYRINGE | INTRAVENOUS | Status: AC
  Administered 2023-01-08: 100 mg via INTRAVENOUS
  Filled 2023-01-08: qty 10

## 2023-01-08 MED ORDER — PROPOFOL 1000 MG/100ML IV EMUL
INTRAVENOUS | Status: AC
  Filled 2023-01-08: qty 100

## 2023-01-08 MED ORDER — PROPOFOL 1000 MG/100ML IV EMUL
0.0000 ug/kg/min | INTRAVENOUS | Status: DC
  Administered 2023-01-08: 5 ug/kg/min via INTRAVENOUS
  Administered 2023-01-09: 20 ug/kg/min via INTRAVENOUS
  Administered 2023-01-09: 15 ug/kg/min via INTRAVENOUS
  Administered 2023-01-10: 20 ug/kg/min via INTRAVENOUS
  Filled 2023-01-08 (×3): qty 100

## 2023-01-08 MED ORDER — SODIUM BICARBONATE 8.4 % IV SOLN
INTRAVENOUS | Status: AC
  Filled 2023-01-08: qty 50

## 2023-01-08 MED ORDER — SODIUM CHLORIDE 0.9% IV SOLUTION: Freq: Once | INTRAVENOUS | Status: DC

## 2023-01-08 MED ORDER — FENTANYL CITRATE PF 50 MCG/ML IJ SOSY
25.0000 ug | PREFILLED_SYRINGE | Freq: Once | INTRAMUSCULAR | Status: AC
  Administered 2023-01-08: 25 ug via INTRAVENOUS
  Filled 2023-01-08: qty 1

## 2023-01-08 MED ORDER — CHLORHEXIDINE GLUCONATE CLOTH 2 % EX PADS
6.0000 | MEDICATED_PAD | Freq: Every day | CUTANEOUS | Status: DC
  Administered 2023-01-08 – 2023-01-25 (×18): 6 via TOPICAL

## 2023-01-08 MED ORDER — FENTANYL 2500MCG IN NS 250ML (10MCG/ML) PREMIX INFUSION
25.0000 ug/h | INTRAVENOUS | Status: DC
  Administered 2023-01-08: 25 ug/h via INTRAVENOUS
  Filled 2023-01-08: qty 250

## 2023-01-08 MED ORDER — BUPRENORPHINE HCL 8 MG SL SUBL: 8.0000 mg | SUBLINGUAL_TABLET | Freq: Every day | SUBLINGUAL | Status: DC | PRN

## 2023-01-08 MED ORDER — RISAQUAD PO CAPS: 1.0000 | ORAL_CAPSULE | Freq: Every day | ORAL | Status: DC

## 2023-01-08 MED ORDER — SUCCINYLCHOLINE CHLORIDE 200 MG/10ML IV SOSY
PREFILLED_SYRINGE | INTRAVENOUS | Status: AC
  Filled 2023-01-08: qty 10

## 2023-01-08 NOTE — Progress Notes (Signed)
RRT called due to pt's SATs maintaining low 80s on 15L HFNC. RN instructed to place 100% NRB on pt and RRT came to bedside. Pt noted to have a significant WOB. Rapid Response called and PCCM notified.

## 2023-01-08 NOTE — Consult Note (Signed)
   [facility] CM Inpatient Consult   01/08/2023  Mahith Dornbos [birth year] [id number]  [facility] [[facility]]  Accountable Care Organization [ACO] Patient: Medicare ACO REACH  Primary Care Provider:  Kirstie Peri, MD with [facility]   Patient screened for less than 30 day hospitalization with noted high risk score for unplanned readmission risk to assess for potential [facility]  [[facility]] Care Management service needs for post hospital transition for care coordination.  Review of patient's electronic medical record reveals patient is readmitted with pneumonia on high flow [place] getting a Cortrak placed on rounds.   Plan:  Continue to follow progress and disposition to assess for post hospital community care coordination/management needs.  Referral request for community care coordination: not determined, yet, pending progress and disposition.  Of note, [facility] Care Management/Population Health does not replace or interfere with any arrangements made by the Inpatient Transition of Care team.  For questions contact:   Charlesetta Shanks, RN BSN CCM [facility]  [phone] business mobile phone Toll free office [phone]  *Concierge Line  [phone] Fax number: [phone] [email or web address]

## 2023-01-08 NOTE — Progress Notes (Signed)
RN went into pt room for scheduled medication. Pt has been coughing since then until RN saw pt expelled blood tinged sputum. Pt saturation went from 85-90% when resting but when pt move and turn side by side it went down to 80s. Pt denies shortness of breath. On call provider made aware. Will continue to monitor.

## 2023-01-08 NOTE — Significant Event (Signed)
Rapid Response Event Note   Reason for Call :  Respiratory distress  Initial Focused Assessment:  Patient sitting upright in bed.  He is alert and oriented.  He is using accessory muscles to breath and feels like he is suffocating.  Lung sounds with crackles and rhonchi through out.  Weak cough He has some mottling on his legs.  BP 149/92  HR 111 RR 28  O2 sat 86-97% on [place] 15L and NRB 15L  He will desat with any activity and takes a few minutes to recover  Family at bedside during event  Interventions:  NTS Reposition MDs to bedside to reassess patient  TX to ICU and placed on Heated HFNC  Plan of Care:     Event Summary:   MD Notified: Roselie Awkward Call Time: 1536 Arrival Time: 1540 End Time: 1645  Marcellina Millin, RN

## 2023-01-08 NOTE — Consult Note (Signed)
Consultation Note   Referring Provider:  [facility] PCP: Kirstie Peri, MD Primary Gastroenterologist: Katrinka Blazing, MD in [place]        Reason for consultation: anemia, esophageal dysmotility  DOA: 01/06/2023         Hospital Day: 3   Assessment and Plan   77 yo male admitted with recurrent PNA, probably 2/2 to aspiration On 10 L of 02 per [place]. -On Maxipime  Nausea / Vomiting / ? dysphagia  Confusing history . Documentation of complaints of dysphagia but he tells me that he has no problems swallowing but rather just having nausea  / vomiting.Marland Kitchen However, MBSS does suggest esophageal dysmotility with barium stasis, including tablet in distal esophagus. Vomiting could be combination of esophageal dysphagia / reflux and also ? Gastritis or PUD 2/2 to NSAIDs. -Esophagram is pending. It can help make sure an esophageal stricture isn't contributing to esophageal stasis.  - EGD may be needed to evaluate for PUD in setting of NSAID use. This will need to wait until medical stable from pulmonary standpoint.   GERD / History of esophageal stenosis in 2020 but no esophageal findings on 2022 EGD (done for dysphagia) -continue BID IV PPI  Acute on chronic anemia. No overt GI blood loss but still consider PUD in setting of ASA and Ibuprofen.  He is up to date on polyp surveillance colonoscopy ( 2022). The exam was complete with a good prep.  Hgb 6.4, down from 9-10 late May.  - getting a unit of blood now.  - continue BID PPI -Probable EGD at  some point to evaluate anemia and N/V and rule out esophageal stenosis.   AKI.  Cr normal on 6/4, now at 4.2  Hyponatremia, hx of SIADH   History of Present Illness Patient is a 78 y.o. year old male whose past medical history includes but is not necessarily limited to GERD / Schatzki ring, adenomatous colon polyps, ischemic cardiomyopathy, HTN,  DM, COPD, recurrent PNA, arthritis, SIADH.    Patient recently hospitalized with multifocal PNA and septic shock. Discharged home 3 weeks ago. Now readmitted with acute respiratory failure / PNA.   Sister at bedside and helps with history. For the last several weeks patient has been having nausea / vomiting at home. He doesn't think he has any swallowing . He got an anti-emetic by outpatient provider and it helped. He doesn't recall starting any new medications recently which may be contributing to the nausea. He has unintentionally lost 15 pounds    MBSS done and sweep of esophagus showed barium stasis, including tablet in distal esophagus. Esophageal retention with retrograde flow below pharyngoesophageal segment (PES)   Hgb this admission noted to be lower than baseline of 9-10 grams. He denies overt GI blood loss. Sister saw emesis on one occasion and it was light brown. No dark stools. He takes a daily baby asa and has been taking Ibuprofen at home . Getting a unit of blood now.  He takes PPI in am and Pepcid in the evenings for history of GERD.  6/4 hgb 10 6/22 hgb 9.2 6/29 hgb 8.7 7/1   hgb 6.8  Last BM was on Thursday, 4 days ago   Labs and Imaging: Recent Labs  01/06/23 1332 01/07/23 0145 01/07/23 0414 01/08/23 0407  WBC 20.2* 15.0*  --  14.9*  HGB 8.7* 6.8* 7.7* 6.4*  HCT 26.6* 20.9* 23.6* 19.8*  PLT 587* 427*  --  649*   Recent Labs    01/06/23 2234 01/07/23 0145 01/08/23 0407  NA 128* 126* 129*  K 3.8 3.8 3.8  CL 94* 98 95*  CO2 18* 17* 19*  GLUCOSE 130* 76 70  BUN 65* 63* 65*  CREATININE 3.90* 3.94* 4.19*  CALCIUM 7.7* 7.1* 7.8*   Recent Labs    01/08/23 0407  PROT 6.0*  ALBUMIN <1.5*  AST 43*  ALT 16  ALKPHOS 79  BILITOT 0.4   No results for input(s): "HEPBSAG", "HCVAB", "HEPAIGM", "HEPBIGM" in the last 72 hours. Recent Labs    01/06/23 1540  LABPROT 15.5*  INR 1.2    Previous GI Evaluation:  EGD: 05/21/2019  -there was presence of and a stenosis at 34 cm from the incisors which  was dilated with [facility] dilator up to 55 [place] with presence of mucosal disruption.  There was presence of a nonobstructive Schatzki's ring at the GE junction and a 3 cm hiatal hernia.  Previous ulcer in the distal esophagus had already healed.   April 2022 EGD  for dysphagia and a screening colonoscopy - Dr. Levon Hedger EGD - No endoscopic esophageal abnormality to explain patient's dysphagia. Esophagus dilated. Dilated. - Esophageal mucosal changes suspicious for short-segment Barrett's esophagus, classified as Barrett's stage C0-M1 per [place] criteria. Biopsied. - Normal stomach. - Normal examined duodenum.  Screening colonoscopy  -good prep - Two 4 mm polyps in the descending colon, removed with a cold snare. Resected and retrieved. - The distal rectum and anal verge are normal on retroflexion view.  FINAL MICROSCOPIC DIAGNOSIS:   A. GE JUNCTION, BIOPSY:  - Gastroesophageal mucosa with mild inflammation consistent with reflux.  - No intestinal metaplasia, dysplasia or carcinoma.   B. COLON, DESCENDING, POLYPECTOMY:  - Tubular adenoma (2).  - No high-grade dysplasia or carcinoma.    Principal Problem:   Pneumonia Active Problems:   Acute exacerbation of COPD with asthma (HCC)   Acute respiratory failure with hypoxia (HCC)   Aspiration pneumonia (HCC)   Past Medical History:  Diagnosis Date   Arthritis    Asthma    Childhood   Emphysema lung (HCC)    Essential hypertension    Ischemic heart disease    Myoview 2020 indicating inferior infarct scar with mild peri-infarct ischemia - managed medically   Type 2 diabetes mellitus (HCC)     Past Surgical History:  Procedure Laterality Date   BIOPSY  08/30/2018   Procedure: BIOPSY;  Surgeon: Malissa Hippo, MD;  [facility];  Service: Endoscopy;;  gastric    BIOPSY  11/05/2020   Procedure: BIOPSY;  Surgeon: Dolores Frame, MD;  [facility];  Service: Gastroenterology;;   COLONOSCOPY      COLONOSCOPY WITH PROPOFOL N/A 11/05/2020   Procedure: COLONOSCOPY WITH PROPOFOL;  Surgeon: Dolores Frame, MD;  [facility];  Service: Gastroenterology;  Laterality: N/A;  Am   ESOPHAGEAL DILATION N/A 05/21/2019   Procedure: ESOPHAGEAL DILATION;  Surgeon: Malissa Hippo, MD;  [facility];  Service: Endoscopy;  Laterality: N/A;   ESOPHAGOGASTRODUODENOSCOPY N/A 05/21/2019   Procedure: ESOPHAGOGASTRODUODENOSCOPY (EGD);  Surgeon: Malissa Hippo, MD;  [facility];  Service: Endoscopy;  Laterality: N/A;  730   ESOPHAGOGASTRODUODENOSCOPY (EGD) WITH PROPOFOL N/A 08/30/2018  Procedure: ESOPHAGOGASTRODUODENOSCOPY (EGD) WITH PROPOFOL;  Surgeon: Malissa Hippo, MD;  [facility];  Service: Endoscopy;  Laterality: N/A;  1:30   ESOPHAGOGASTRODUODENOSCOPY (EGD) WITH PROPOFOL N/A 11/05/2020   Procedure: ESOPHAGOGASTRODUODENOSCOPY (EGD) WITH PROPOFOL;  Surgeon: Dolores Frame, MD;  [facility];  Service: Gastroenterology;  Laterality: N/A;   HERNIA REPAIR     bilateral lower abdomen   POLYPECTOMY  11/05/2020   Procedure: POLYPECTOMY;  Surgeon: Dolores Frame, MD;  [facility];  Service: Gastroenterology;;   Gaspar Bidding DILATION  11/05/2020   Procedure: Gaspar Bidding DILATION;  Surgeon: Marguerita Merles, Reuel Boom, MD;  [facility];  Service: Gastroenterology;;    History reviewed. No pertinent family history.  Prior to Admission medications   Medication Sig Start Date End Date Taking? Authorizing Provider  acetaminophen (TYLENOL) 325 MG tablet Take 2 tablets (650 mg total) by mouth every 6 (six) hours as needed for mild pain (or Fever >/= 101). 11/26/19  Yes Emokpae, Courage, MD  albuterol (VENTOLIN HFA) 108 (90 Base) MCG/ACT inhaler Inhale 2 puffs into the lungs every 4 (four) hours as needed for wheezing or shortness of breath. Patient taking differently: Inhale 1 puff into the lungs every 4 (four) hours  as needed for wheezing or shortness of breath. 11/26/19  Yes Emokpae, Courage, MD  ALPRAZolam Prudy Feeler) 1 MG tablet Take 1 tablet (1 mg total) by mouth 3 (three) times daily as needed for anxiety. Patient taking differently: Take 1-2 mg by mouth at bedtime as needed for anxiety or sleep. 01/28/19  Yes Welborn, Ryan, DO  amLODipine (NORVASC) 10 MG tablet Take 10 mg by mouth at bedtime.   Yes [provider]  Budeson-Glycopyrrol-Formoterol (BREZTRI AEROSPHERE) 160-9-4.8 MCG/ACT AERO Inhale 2 puffs into the lungs in the morning and at bedtime. 12/13/22  Yes Leroy Sea, MD  buprenorphine (SUBUTEX) 8 MG SUBL SL tablet Place 4 mg under the tongue as needed (pain). 01/04/19  Yes [provider]  cetirizine (ZYRTEC) 10 MG tablet Take 10 mg by mouth at bedtime.   Yes [provider]  famotidine (PEPCID) 20 MG tablet Take 1 tablet (20 mg total) by mouth 2 (two) times daily for 7 days. Take one tablet twice daily for two days Patient taking differently: Take 20 mg by mouth as needed for heartburn or indigestion. 12/30/22 01/06/23 Yes Gerhard Munch, MD  fluticasone [facility]) 50 MCG/ACT nasal spray Place 1 spray into both nostrils daily. 08/22/18  Yes [provider]  gabapentin (NEURONTIN) 400 MG capsule Take 1 capsule (400 mg total) by mouth every 8 (eight) hours as needed. Patient taking differently: Take 400-800 mg by mouth as needed (pain). 01/28/19  Yes Welborn, Ryan, DO  glipiZIDE (GLUCOTROL XL) 10 MG 24 hr tablet Take 10 mg by mouth daily. 11/06/22  Yes [provider]  guaiFENesin (MUCINEX) 600 MG 12 hr tablet Take 1 tablet (600 mg total) by mouth 2 (two) times daily as needed for cough. Patient taking differently: Take 600 mg by mouth 2 (two) times daily as needed for cough or to loosen phlegm. 12/13/22  Yes Leroy Sea, MD  metFORMIN (GLUCOPHAGE) 1000 MG tablet Take 1,000 mg by mouth 2 (two) times daily with a meal.  12/07/14  Yes [provider]   montelukast (SINGULAIR) 10 MG tablet Take 1 tablet (10 mg total) by mouth at bedtime. 06/15/22  Yes Mannam, Praveen, MD  ondansetron (ZOFRAN-ODT) 4 MG disintegrating tablet Take 1 tablet (4 mg total) by mouth  every 8 (eight) hours as needed for nausea or vomiting. Consider taking 30 minutes prior to meals. Patient taking differently: Take 4 mg by mouth as needed for nausea or vomiting. 12/30/22  Yes Gerhard Munch, MD  pantoprazole (PROTONIX) 40 MG tablet Take 1 tablet (40 mg total) by mouth daily. 12/15/20  Yes Dolores Frame, MD  pramipexole (MIRAPEX) 0.5 MG tablet Take 0.5 mg by mouth at bedtime. 11/04/22  Yes [provider]  pravastatin (PRAVACHOL) 20 MG tablet Take 20 mg by mouth every evening.   Yes [provider]  sucralfate (CARAFATE) 1 g tablet Take 1 tablet (1 g total) by mouth 4 (four) times daily -  with meals and at bedtime. 12/30/22  Yes Gerhard Munch, MD  valsartan (DIOVAN) 160 MG tablet Take 160 mg by mouth daily. 11/06/22  Yes [provider]  aspirin EC 81 MG tablet Take 1 tablet (81 mg total) by mouth daily. Swallow whole. Patient not taking: Reported on 01/06/2023 09/30/20   Jonelle Sidle, MD  linezolid (ZYVOX) 600 MG tablet Take 1 tablet (600 mg total) by mouth every 12 (twelve) hours. Patient not taking: Reported on 01/06/2023 12/13/22   Leroy Sea, MD    Current Facility-Administered Medications  Medication Dose Route Frequency Provider Last Rate Last Admin   0.9 %  sodium chloride infusion (Manually program via Guardrails IV Fluids)   Intravenous Once Luiz Iron, NP       acetaminophen (TYLENOL) tablet 650 mg  650 mg Oral Q6H PRN Emeline General, MD   650 mg at 01/07/23 0419   acidophilus (RISAQUAD) capsule 1 capsule  1 capsule Oral Daily Bevelyn Ngo, NP       albuterol (PROVENTIL) (2.5 MG/3ML) 0.083% nebulizer solution 2.5 mg  2.5 mg Inhalation Q4H PRN Mikey College T, MD   2.5 mg at 01/08/23 1610   ALPRAZolam Prudy Feeler)  tablet 1 mg  1 mg Oral TID PRN Marguerita Merles Latif, DO       arformoterol [facility]) nebulizer solution 15 mcg  15 mcg Nebulization BID Marguerita Merles [place], DO   15 mcg at 01/08/23 0732   aspirin EC tablet 81 mg  81 mg Oral Daily Mikey College T, MD   81 mg at 01/08/23 0934   budesonide (PULMICORT) nebulizer solution 0.25 mg  0.25 mg Nebulization BID Marguerita Merles Latif, DO   0.25 mg at 01/08/23 0732   [START ON 01/09/2023] buprenorphine (SUBUTEX) sublingual tablet 8 mg  8 mg Sublingual Daily PRN Marguerita Merles Latif, DO       And   buprenorphine (SUBUTEX) SL tablet 4 mg  4 mg Sublingual QHS PRN Sheikh, Omair Latif, DO       ceFEPIme (MAXIPIME) 2 g in sodium chloride 0.9 % 100 mL IVPB  2 g Intravenous Q24H Doristine Counter, RPH 200 mL/hr at 01/07/23 1459 2 g at 01/07/23 1459   famotidine (PEPCID) tablet 20 mg  20 mg Oral Daily Mikey College T, MD   20 mg at 01/08/23 0934   feeding supplement (GLUCERNA SHAKE) (GLUCERNA SHAKE) liquid 237 mL  237 mL Oral BID BM Sheikh, Omair Latif, DO       fluticasone (FLONASE) 50 MCG/ACT nasal spray 2 spray  2 spray Each Nare Daily Mikey College T, MD   2 spray at 01/08/23 0935   gabapentin (NEURONTIN) capsule 100 mg  100 mg Oral Q8H PRN Mikey College T, MD   100 mg at 01/07/23 2326   guaiFENesin [facility])  12 hr tablet 1,200 mg  1,200 mg Oral BID Marguerita Merles [place], DO   1,200 mg at 01/08/23 0932   insulin aspart (novoLOG) injection 0-9 Units  0-9 Units Subcutaneous TID WC Mikey College T, MD   1 Units at 01/08/23 1225   ipratropium (ATROVENT) nebulizer solution 0.5 mg  0.5 mg Nebulization Q6H Sheikh, Kateri Mc Latif, DO   0.5 mg at 01/08/23 0732   levalbuterol (XOPENEX) nebulizer solution 0.63 mg  0.63 mg Nebulization Q6H Sheikh, Kateri Mc Gisela, DO   0.63 mg at 01/08/23 0732   loratadine (CLARITIN) tablet 10 mg  10 mg Oral Daily Mikey College T, MD   10 mg at 01/08/23 0934   metoCLOPramide (REGLAN) injection 10 mg  10 mg Intravenous Q6H Mikey College T, MD   10 mg at 01/08/23 1217    montelukast (SINGULAIR) tablet 10 mg  10 mg Oral QHS Mikey College T, MD   10 mg at 01/07/23 2102   multivitamin (RENA-VIT) tablet 1 tablet  1 tablet Oral QHS Marguerita Merles [place], DO   1 tablet at 01/07/23 2102   pantoprazole (PROTONIX) injection 40 mg  40 mg Intravenous Q12H Karie Fetch P, DO       pramipexole (MIRAPEX) tablet 0.5 mg  0.5 mg Oral QHS Mikey College T, MD   0.5 mg at 01/07/23 2102   pravastatin (PRAVACHOL) tablet 20 mg  20 mg Oral QPM Mikey College T, MD   20 mg at 01/07/23 1715   vancomycin variable dose per unstable renal function (pharmacist dosing)   Does not apply See admin instructions Doristine Counter, [facility]        Allergies as of 01/06/2023 - Review Complete 01/06/2023  Allergen Reaction Noted   Penicillins Rash 12/14/2014    Social History   Socioeconomic History   Marital status: Widowed    Spouse name: Not on file   Number of children: Not on file   Years of education: Not on file   Highest education level: Not on file  Occupational History   Not on file  Tobacco Use   Smoking status: Former    Packs/day: 2.00    Years: 33.00    Additional pack years: 0.00    Total pack years: 66.00    Types: Cigarettes    Quit date: 01/22/1993    Years since quitting: 29.9   Smokeless tobacco: Never  Vaping Use   Vaping Use: Never used  Substance and Sexual Activity   Alcohol use: Not Currently   Drug use: Never   Sexual activity: Not on file  Other Topics Concern   Not on file  Social History Narrative   Not on file   Social Determinants of Health   Financial Resource Strain: Low Risk  (09/27/2022)   Overall Financial Resource Strain (CARDIA)    Difficulty of Paying Living Expenses: Not very hard  Food Insecurity: No Food Insecurity (12/08/2022)   Hunger Vital Sign    Worried About Running Out of Food in the Last Year: Never true    Ran Out of Food in the Last Year: Never true  Transportation Needs: No Transportation Needs (12/08/2022)   PRAPARE - Therapist, art (Medical): No    Lack of Transportation (Non-Medical): No  Physical Activity: Inactive (07/31/2022)   Exercise Vital Sign    Days of Exercise per Week: 0 days    Minutes of Exercise per Session: 0 min  Stress: Not on file  Social Connections: Not  on file  Intimate Partner Violence: Not At Risk (12/08/2022)   Humiliation, Afraid, Rape, and Kick questionnaire    Fear of Current or Ex-Partner: No    Emotionally Abused: No    Physically Abused: No    Sexually Abused: No     Code Status   Code Status: Full Code  Review of Systems: All systems reviewed and negative except where noted in HPI.  Physical Exam: Vital signs in last 24 hours: Temp:  [98 F (36.7 C)-99 F (37.2 C)] 98.6 F (37 C) (07/01 1242) Pulse Rate:  [92-103] 92 (07/01 1242) Resp:  [16-24] 24 (07/01 1242) BP: (113-127)/(59-80) 113/59 (07/01 1242) SpO2:  [86 %-94 %] 92 % (07/01 1242)    General:  Pleasant male in NAD Psych:  Cooperative. Normal mood and affect Eyes: Pupils equal Ears:  Normal auditory acuity Nose: No deformity, discharge or lesions Neck:  Supple, no masses felt Lungs:  Diminished breath sounds in bilateral upper and lower lobes.   Heart:  Regular rate, regular rhythm.  Abdomen:  Soft, nondistended, nontender, active bowel sounds, no masses felt Rectal :  Deferred Msk: Symmetrical without gross deformities.  Neurologic:  Alert, oriented, grossly normal neurologically Extremities : No edema    Intake/Output from previous day: 06/30 0701 - 07/01 0700 In: 1012.6 [P.O.:240; I.V.:772.6] Out: 900 [Urine:900] Intake/Output this shift:  Total I/O In: 250 [I.V.:250] Out: -      Willette Cluster, NP-C @  01/08/2023, 1:31 PM

## 2023-01-08 NOTE — Procedures (Signed)
Intubation Procedure Note  Tayt Lombardozzi  [id number]  1945-12-04  Date:01/08/23  Time:8:10 PM   Provider Performing:Corrado Hymon B Yisrael Obryan    Procedure: Intubation (31500)  Indication(s) Respiratory Failure  Consent Unable to obtain consent due to emergent nature of procedure.   Anesthesia Etomidate, Versed, Fentanyl, and Rocuronium   Time Out Verified patient identification, verified procedure, site/side was marked, verified correct patient position, special equipment/implants available, medications/allergies/relevant history reviewed, required imaging and test results available.   Sterile Technique Usual hand hygeine, masks, and gloves were used   Procedure Description Patient positioned in bed supine.  Sedation given as noted above.  Patient was intubated with endotracheal tube using Glidescope.  View was Grade 1 full glottis .  Number of attempts was 1.  Colorimetric CO2 detector was consistent with tracheal placement.   Complications/Tolerance None; patient tolerated the procedure well. Chest X-ray is ordered to verify placement.   EBL Minimal   Specimen(s) None

## 2023-01-08 NOTE — Procedures (Signed)
Cortrak  Person Inserting Tube:  Anagabriela Jokerst D, RD Tube Type:  Cortrak - 43 inches Tube Size:  10 Tube Location:  Left nare Secured by: Bridle Technique Used to Measure Tube Placement:  Marking at nare/corner of mouth Cortrak Secured At:  69 cm Procedure Comments:  Cortrak Tube Team Note:  Consult received to place a Cortrak feeding tube.   X-ray is required, abdominal x-ray has been ordered by the Cortrak team. Please confirm tube placement before using the Cortrak tube.   If the tube becomes dislodged please keep the tube and contact the Cortrak team at [email or web address] for replacement.  If after hours and replacement cannot be delayed, place a NG tube and confirm placement with an abdominal x-ray.    Aitanna Haubner, RD, LDN Clinical Dietitian RD pager # available in AMION  After hours/weekend pager # available in AMION    

## 2023-01-08 NOTE — Consult Note (Addendum)
NAME:  Travis Beck, MRN:  [id number], DOB:  1945/09/30, LOS: 2 ADMISSION DATE:  01/06/2023, CONSULTATION DATE:  01/08/2023 REFERRING MD:  Marland Mcalpine, CHIEF COMPLAINT:  Aspiration Pneumonia, nausea and vomiting   History of Present Illness:  77 y.o. male with medical history significant of IIDM, HTN, SIDAH, COPD, ( Former smoker quit 1994 with a 66 pack year smoking history diabetic neuropathy, recurrent pneumonia presented 01/06/2023 with worsening of nauseous vomiting cough and shortness of breath.  Patient was recently hospitalized 5/23-5/26 for multifocal MRSA pneumonia  and septic shock requiring vasopressor on admission-stabilized and subsequently discharged home on Augmentin/doxycycline . Pt returned to [facility] 12/07/2022 with fever of 103.1 and ongoing left sided pleuritic pain. He has a small effusion that was not big enough to tap.  He was admitted for incompletely treated PNA-as sputum cultures finalized postdischarge, and Doxycycline did not cover MRSA. He was treated with Zyvox, and discharged home on Zyvox p.o on 12/13/2022. Initially his breathing symptoms improved with Zyvox however patient continued to experience frequent nausea, vomiting and cough. He presented to the ED 6/29 as these symptoms were worsening.  In the ED he was mildly febrile at 99.6, He had  tachycardia, oxygen sats were 93% on 3 L. CXR again showed a multifocal pneumonia, right sided. Na 128/ Creatinine of 4.2, BUN 66 and WBC of 20. His HGB was 8.7.Respiratory Panel was negative. Lactic Acid trend since admission 2.7>>2.8>>1,3 Pulmonary was  asked to consult for recurrent vs slow to resolve pneumonia and hypoxemia . Of note , patient is followed in the [facility]  Pulmonary clinic by Dr. Isaiah Serge and Rubye Oaks NP.  Per family patient has had about a 15 pound weight loss since the end of May 24 when pneumonia was initially diagnosed.  Pertinent  Medical History   Past Medical History:  Diagnosis Date    Arthritis    Asthma    Childhood   Emphysema lung (HCC)    Essential hypertension    Ischemic heart disease    Myoview 2020 indicating inferior infarct scar with mild peri-infarct ischemia - managed medically   Type 2 diabetes mellitus (HCC)      Significant Hospital Events: Including procedures, antibiotic start and stop dates in addition to other pertinent events         5/23-5/26>> hospitalization for septic shock from multilobar PNA. 5/30>>6/5>> fever 103 F at home-ongoing left-sided pleuritic chest pain- 6/29>> Recurrent multifocal  ? Aspiration  pneumonia  Interim History / Subjective:  Currently on 10 L HFNC, increased from 8 L this am due to sats below 90%. Sat probe changed to ear upon my arrival, sats are 94% on the 10 L HFNC. HGB is 6.3>> transfusion pending CXR 01/08/2023>> shows extensive right lung / and mild left mid lung / left lower lobe interstitial airspace opacities>> [facility] wrote for some lasix  01/06/2023>>Swallow evaluation >> esophageal dysphagia, needs GI consult for esophageal dysphagia management. Family state patient eats very fast, and he dose not complain of cough or  getting choked.  Objective   Blood pressure 125/65, pulse (!) 101, temperature 99 F (37.2 C), temperature source Oral, resp. rate 16, height 5\' 5"  (1.651 m), weight 71.7 kg, SpO2 93 %.        Intake/Output Summary (Last 24 hours) at 01/08/2023 0912 Last data filed at 01/08/2023 0600 Gross per 24 hour  Intake 1012.63 ml  Output 900 ml  Net 112.63 ml   Filed Weights   01/06/23 1322  Weight:  71.7 kg    Examination: General: Awake and alert, frail elderly male supine in bed, in NAD at present HENT:  NCAT, MM pink and dry, No LAD. Lungs:  Bilateral chest excursion, no nasal flaring, sternal retractions of accessory muscle use, coarse rhonchi , diminished per bases R>L Cardiovascular: ST per tele  Abdomen:  Soft , Flat , BS +, Body mass index is 26.29 kg/m.  Extremities:  No obvious  abnormalities, loss of tissue mass Neuro:  Awake and alert, MAE x 4, A&O x 3, appropriate GU:  Not assessed, but urine remains very concentrated  T max 98.8 Net + 1650 since admission   Labs reviewed 7/1 WBC 14.9/ HGB 6.4/ 649>> Suspect he is dehydrated  Na 129 ( 126) K 3.8, Cl 95, CO2 19( 17), BUN 65, Creatinine 4.19 ( 3.94), Calcium 7.8( Corrects to 10 with albumin of < 1.5) AST 43/ ALT 16, GFR 14, Gap 15 MAG 1.7, phos 4  Resolved Hospital Problem list     Assessment & Plan:  Slow to resolve , recurrent aspiration pneumonia Acute Respiratory Failure with Hypoxia Currently requiring 10 L HFNC HGB 6.4 Plan Titrate oxygen for oxygen saturations > 92% Low thresh hold for transfer should he decompensate Continue vancomycin and cefepime per primary team Probiotics as patient has been on antibiotics x 1 month Trend WBC and Fever Curve Trend Micro ( Blood Cx pending ,) Urine Cx was negative for growth Continue Brovana, Pulmiocort, Atrovent scheduled Continue Xopenex/ Albuterol  prn  Aggressive pulmonary toilet with IS and flutter valve / Mucinex as is already ordered OOB to chair PT/ OT CXR in am and prn Transfuse 1 unit PRBC per Primary team Lasix  40 mg x 1 as ordered per primary team. ABG ordered by Primary Team result pending Replete Mag   Anemia HGB 6.4 Plan Transfuse as ordered and for HGB < 7 Trend CBC Guaiac Stool Concern for GI issues/ MW tear  with frequent vomiting recently GI eval for anemia and esophageal dysmotility   AKI Plan Trend BMET Monitor UO Replete electrolytes as needed  Esophageal Dysmotility ( per swallow eval 6/30) possibly contributing to slow to resolve pneumonia Plan Recommendation per Speech is for referral to GI for ongoing esophageal dysphagia management.  Fully upright position for all meals  Eat slowly, small bites/ sips Sit upright 30-60 minutes after meals   Weight loss >> 15 pounds in the last month Plan Protein  supplements  Consider dietitian consult  Best Practice (right click and "Reselect all SmartList Selections" daily)   Per Primary Team Wife and daughter updated at bedside 7/1 Labs   CBC: Recent Labs  Lab 01/06/23 1332 01/07/23 0145 01/07/23 0414 01/08/23 0407  WBC 20.2* 15.0*  --  14.9*  NEUTROABS 18.1*  --   --  11.6*  HGB 8.7* 6.8* 7.7* 6.4*  HCT 26.6* 20.9* 23.6* 19.8*  MCV 88.1 87.4  --  86.8  PLT 587* 427*  --  649*    Basic Metabolic Panel: Recent Labs  Lab 01/06/23 1332 01/06/23 2234 01/07/23 0145 01/08/23 0407  NA 128* 128* 126* 129*  K 3.9 3.8 3.8 3.8  CL 93* 94* 98 95*  CO2 19* 18* 17* 19*  GLUCOSE 190* 130* 76 70  BUN 66* 65* 63* 65*  CREATININE 4.20* 3.90* 3.94* 4.19*  CALCIUM 7.8* 7.7* 7.1* 7.8*  MG  --   --   --  1.7  PHOS  --   --   --  4.0  GFR: Estimated Creatinine Clearance: 12.4 mL/min (A) (by C-G formula based on SCr of 4.19 mg/dL (H)). Recent Labs  Lab 01/06/23 1332 01/06/23 1540 01/06/23 1758 01/06/23 2303 01/07/23 0145 01/08/23 0407  WBC 20.2*  --   --   --  15.0* 14.9*  LATICACIDVEN  --  2.7* 3.7* 2.8* 1.3  --     Liver Function Tests: Recent Labs  Lab 01/08/23 0407  AST 43*  ALT 16  ALKPHOS 79  BILITOT 0.4  PROT 6.0*  ALBUMIN <1.5*   No results for input(s): "LIPASE", "AMYLASE" in the last 168 hours. No results for input(s): "AMMONIA" in the last 168 hours.  ABG    Component Value Date/Time   PHART 7.41 12/01/2022 0745   PCO2ART 35 12/01/2022 0745   PO2ART 133 (H) 12/01/2022 0745   HCO3 22.2 12/01/2022 0745   ACIDBASEDEF 1.9 12/01/2022 0745   O2SAT 99.3 12/01/2022 0745     Coagulation Profile: Recent Labs  Lab 01/06/23 1540  INR 1.2    Cardiac Enzymes: No results for input(s): "CKTOTAL", "CKMB", "CKMBINDEX", "TROPONINI" in the last 168 hours.  HbA1C: Hgb A1c MFr Bld  Date/Time Value Ref Range Status  12/01/2022 05:41 AM 6.8 (H) 4.8 - 5.6 % Final    Comment:    (NOTE)         Prediabetes: 5.7 -  6.4         Diabetes: >6.4         Glycemic control for adults with diabetes: <7.0     CBG: Recent Labs  Lab 01/07/23 0920 01/07/23 1127 01/07/23 1654 01/07/23 1951 01/08/23 0802  GLUCAP 88 154* 175* 117* 75    Review of Systems:   Shortness of breath with exertion and nausea, which is better than when admitted  Past Medical History:  He,  has a past medical history of Arthritis, Asthma, Emphysema lung (HCC), Essential hypertension, Ischemic heart disease, and Type 2 diabetes mellitus (HCC).   Surgical History:   Past Surgical History:  Procedure Laterality Date   BIOPSY  08/30/2018   Procedure: BIOPSY;  Surgeon: Malissa Hippo, MD;  [facility];  Service: Endoscopy;;  gastric    BIOPSY  11/05/2020   Procedure: BIOPSY;  Surgeon: Dolores Frame, MD;  [facility];  Service: Gastroenterology;;   COLONOSCOPY     COLONOSCOPY WITH PROPOFOL N/A 11/05/2020   Procedure: COLONOSCOPY WITH PROPOFOL;  Surgeon: Dolores Frame, MD;  [facility];  Service: Gastroenterology;  Laterality: N/A;  Am   ESOPHAGEAL DILATION N/A 05/21/2019   Procedure: ESOPHAGEAL DILATION;  Surgeon: Malissa Hippo, MD;  [facility];  Service: Endoscopy;  Laterality: N/A;   ESOPHAGOGASTRODUODENOSCOPY N/A 05/21/2019   Procedure: ESOPHAGOGASTRODUODENOSCOPY (EGD);  Surgeon: Malissa Hippo, MD;  [facility];  Service: Endoscopy;  Laterality: N/A;  730   ESOPHAGOGASTRODUODENOSCOPY (EGD) WITH PROPOFOL N/A 08/30/2018   Procedure: ESOPHAGOGASTRODUODENOSCOPY (EGD) WITH PROPOFOL;  Surgeon: Malissa Hippo, MD;  [facility];  Service: Endoscopy;  Laterality: N/A;  1:30   ESOPHAGOGASTRODUODENOSCOPY (EGD) WITH PROPOFOL N/A 11/05/2020   Procedure: ESOPHAGOGASTRODUODENOSCOPY (EGD) WITH PROPOFOL;  Surgeon: Dolores Frame, MD;  [facility];  Service: Gastroenterology;  Laterality: N/A;   HERNIA REPAIR      bilateral lower abdomen   POLYPECTOMY  11/05/2020   Procedure: POLYPECTOMY;  Surgeon: Dolores Frame, MD;  [facility];  Service: Gastroenterology;;   Gaspar Bidding DILATION  11/05/2020  Procedure: SAVORY DILATION;  Surgeon: Marguerita Merles, Reuel Boom, MD;  [facility];  Service: Gastroenterology;;     Social History:   reports that he quit smoking about 29 years ago. His smoking use included cigarettes. He has a 66.00 pack-year smoking history. He has never used smokeless tobacco. He reports that he does not currently use alcohol. He reports that he does not use drugs.   Family History:  His family history is not on file.   Allergies Allergies  Allergen Reactions   Penicillins Rash    No problems with ampicillin during hospitalization     Home Medications  Prior to Admission medications   Medication Sig Start Date End Date Taking? Authorizing Provider  acetaminophen (TYLENOL) 325 MG tablet Take 2 tablets (650 mg total) by mouth every 6 (six) hours as needed for mild pain (or Fever >/= 101). 11/26/19  Yes Emokpae, Courage, MD  albuterol (VENTOLIN HFA) 108 (90 Base) MCG/ACT inhaler Inhale 2 puffs into the lungs every 4 (four) hours as needed for wheezing or shortness of breath. Patient taking differently: Inhale 1 puff into the lungs every 4 (four) hours as needed for wheezing or shortness of breath. 11/26/19  Yes Emokpae, Courage, MD  ALPRAZolam Prudy Feeler) 1 MG tablet Take 1 tablet (1 mg total) by mouth 3 (three) times daily as needed for anxiety. Patient taking differently: Take 1-2 mg by mouth at bedtime as needed for anxiety or sleep. 01/28/19  Yes Welborn, Ryan, DO  amLODipine (NORVASC) 10 MG tablet Take 10 mg by mouth at bedtime.   Yes [provider]  Budeson-Glycopyrrol-Formoterol (BREZTRI AEROSPHERE) 160-9-4.8 MCG/ACT AERO Inhale 2 puffs into the lungs in the morning and at bedtime. 12/13/22  Yes Leroy Sea, MD  buprenorphine (SUBUTEX) 8 MG SUBL  SL tablet Place 4 mg under the tongue as needed (pain). 01/04/19  Yes [provider]  cetirizine (ZYRTEC) 10 MG tablet Take 10 mg by mouth at bedtime.   Yes [provider]  famotidine (PEPCID) 20 MG tablet Take 1 tablet (20 mg total) by mouth 2 (two) times daily for 7 days. Take one tablet twice daily for two days Patient taking differently: Take 20 mg by mouth as needed for heartburn or indigestion. 12/30/22 01/06/23 Yes Gerhard Munch, MD  fluticasone [facility]) 50 MCG/ACT nasal spray Place 1 spray into both nostrils daily. 08/22/18  Yes [provider]  gabapentin (NEURONTIN) 400 MG capsule Take 1 capsule (400 mg total) by mouth every 8 (eight) hours as needed. Patient taking differently: Take 400-800 mg by mouth as needed (pain). 01/28/19  Yes Welborn, Ryan, DO  glipiZIDE (GLUCOTROL XL) 10 MG 24 hr tablet Take 10 mg by mouth daily. 11/06/22  Yes [provider]  guaiFENesin (MUCINEX) 600 MG 12 hr tablet Take 1 tablet (600 mg total) by mouth 2 (two) times daily as needed for cough. Patient taking differently: Take 600 mg by mouth 2 (two) times daily as needed for cough or to loosen phlegm. 12/13/22  Yes Leroy Sea, MD  metFORMIN (GLUCOPHAGE) 1000 MG tablet Take 1,000 mg by mouth 2 (two) times daily with a meal.  12/07/14  Yes [provider]  montelukast (SINGULAIR) 10 MG tablet Take 1 tablet (10 mg total) by mouth at bedtime. 06/15/22  Yes Mannam, Praveen, MD  ondansetron (ZOFRAN-ODT) 4 MG disintegrating tablet Take 1 tablet (4 mg total) by mouth every 8 (eight) hours as needed for nausea or vomiting. Consider taking 30 minutes prior to meals. Patient  taking differently: Take 4 mg by mouth as needed for nausea or vomiting. 12/30/22  Yes Gerhard Munch, MD  pantoprazole (PROTONIX) 40 MG tablet Take 1 tablet (40 mg total) by mouth daily. 12/15/20  Yes Dolores Frame, MD  pramipexole (MIRAPEX) 0.5 MG tablet Take 0.5 mg by mouth at bedtime.  11/04/22  Yes [provider]  pravastatin (PRAVACHOL) 20 MG tablet Take 20 mg by mouth every evening.   Yes [provider]  sucralfate (CARAFATE) 1 g tablet Take 1 tablet (1 g total) by mouth 4 (four) times daily -  with meals and at bedtime. 12/30/22  Yes Gerhard Munch, MD  valsartan (DIOVAN) 160 MG tablet Take 160 mg by mouth daily. 11/06/22  Yes [provider]  aspirin EC 81 MG tablet Take 1 tablet (81 mg total) by mouth daily. Swallow whole. Patient not taking: Reported on 01/06/2023 09/30/20   Jonelle Sidle, MD  linezolid (ZYVOX) 600 MG tablet Take 1 tablet (600 mg total) by mouth every 12 (twelve) hours. Patient not taking: Reported on 01/06/2023 12/13/22   Leroy Sea, MD     Pulmonary  time: 14 minutes   Bevelyn Ngo, MSN, AGACNP-BC [facility] Pulmonary/Critical Care Medicine See Amion for personal pager PCCM on call pager [phone]  01/08/2023 11:04 AM

## 2023-01-08 NOTE — TOC Initial Note (Addendum)
Transition of Care [facility]) - Initial/Assessment Note    Patient Details  Name: Travis Beck MRN: [id number] Date of Birth: 08-05-1945  [facility]) CM/SW Contact:    Ronny Bacon, RN Phone Number: 01/08/2023, 10:49 AM  Clinical Narrative:  Spoke with patient and family at bedside. Patient from home with family, PCP is Dr. Clelia Croft but patient wants a different provider. Patient uses Scientist, water quality. Patient has home oxygen (ROTECH) usually at 2L, currently at 10 L here in the hospital. If patient continues to be above 5L, will need new sat test prior to discharge home. Patient does not have any other DME at home. Family will transport patient home at discharge.           Expected Discharge Plan: Home/Self Care Barriers to Discharge: Continued Medical Work up   Patient Goals and CMS Choice Patient states their goals for this hospitalization and ongoing recovery are:: To go home          Expected Discharge Plan and Services       Living arrangements for the past 2 months: Single Family Home                                      Prior Living Arrangements/Services Living arrangements for the past 2 months: Single Family Home   Patient language and need for interpreter reviewed:: Yes Do you feel safe going back to the place where you live?: Yes      Need for Family Participation in Patient Care: Yes (Comment) Care giver support system in place?: Yes (comment) Current home services: DME (Oxygen) Criminal Activity/Legal Involvement Pertinent to Current Situation/Hospitalization: No - Comment as needed  Activities of Daily Living      Permission Sought/Granted                  Emotional Assessment Appearance:: Appears stated age Attitude/Demeanor/Rapport: Engaged Affect (typically observed): Appropriate Orientation: : Oriented to Self, Oriented to Place, Oriented to  Time, Oriented to Situation Alcohol / Substance Use: Not Applicable Psych  Involvement: No (comment)  Admission diagnosis:  Pneumonia [J18.9] AKI (acute kidney injury) (HCC) [N17.9] Multifocal pneumonia [J18.9] Sepsis due to methicillin resistant Staphylococcus aureus (MRSA) with acute renal failure without septic shock, unspecified acute renal failure type (HCC) [A41.02, R65.20, N17.9] Patient Active Problem List   Diagnosis Date Noted   Pneumonia 01/06/2023   Aspiration pneumonia (HCC) 12/07/2022   Acute respiratory failure with hypoxia (HCC) 11/30/2022   COPD with asthma 05/15/2022   Thrush 05/15/2022   Dysuria 04/12/2022   Multifocal pneumonia 11/24/2019   Acute hypoxemic respiratory failure (HCC) 11/24/2019   Esophageal dysphagia 05/06/2019   Anemia, unspecified 05/06/2019   Hypoxemia    Acute exacerbation of COPD with asthma (HCC) 01/25/2019   GERD (gastroesophageal reflux disease) 01/24/2019   Hematemesis without nausea 08/20/2018   Melena 08/20/2018   Chronic bronchitis with emphysema 05/07/2018   Abnormal CT scan of lung 01/22/2018   Pulmonary emphysema (HCC) 01/22/2018   PCP:  Kirstie Peri, MD Pharmacy:   [facility] - [place], [place] - [address] [place] [address] Phone: [phone] Fax: [phone]  [facility] [phone] - [place], [place] - [address] AT [facility] OF [address] [place] [address] Phone: [phone] Fax: [phone]  [facility]  [address] [place] [address] Phone: [phone] Fax: [phone]  [facility] - [place], [place] - [address] [place] [address] Phone: [phone] Fax: [phone]     Social Determinants of Health (SDOH) Social History: SDOH Screenings   Food Insecurity: No Food Insecurity (12/08/2022)  Housing: Patient Declined (12/08/2022)  Transportation Needs: No Transportation Needs (12/08/2022)  Utilities: Not At Risk (12/08/2022)  Financial Resource Strain:  Low Risk  (09/27/2022)  Physical Activity: Inactive (07/31/2022)  Tobacco Use: Medium Risk (01/06/2023)   SDOH Interventions:     Readmission Risk Interventions     No data to display

## 2023-01-08 NOTE — Progress Notes (Signed)
Nutrition Follow-up  DOCUMENTATION CODES:   Non-severe (moderate) malnutrition in context of acute illness/injury  INTERVENTION:  Once able to initiate tube feeding via Cortrak, recommend: Start Osmolite 1.5 at 20 ml and advance by 10ml q8h to a goal rate of 61ml/hr (1200 ml per day) Prosource TF20 60ml once daily Free water flushes q4h Goal tube feeding regimen provides 1880 kcal, 95 gm protein, 1814 ml free water daily  Monitor magnesium, potassium, and phosphorus BID for at least 3 days following initiation of TF, MD to replete as needed, as pt is at risk for refeeding syndrome  Recommend bowel regimen as last documented BM x4 days ago  NUTRITION DIAGNOSIS:   Moderate Malnutrition related to acute illness (n/v, recurrent aspiration PNA) as evidenced by mild fat depletion, moderate muscle depletion, mild muscle depletion.  GOAL:   Patient will meet greater than or equal to 90% of their needs - goal unmet  MONITOR:   Labs, Weight trends, Diet advancement, TF tolerance  REASON FOR ASSESSMENT:   Other (Comment) (Cortrak list) Assessment of nutrition requirement/status  ASSESSMENT:   77 y.o. male admits related to nausea and vomiting, and cough. PMH includes: T2DM, HTN, COPD. Pt is currently receiving medical management related to pneumonia.  6/30 - s/p MBS- findings of esophageal dysphagia; recommend regular diet, thin liquids 7/1 - diet downgraded to NPO; Cortrak placed (tip in distal stomach)  Pt noted to desat after eating this morning. Swallow study yesterday reflective of esophageal dysmotility and aspiration with swallowing. GI following and plans for esophagram to r/o stricture and possible EGD once oxygen requirements more stable.   Spoke with pt's sister at bedside as pt was drowsy and attempting to sleep. However history limited as observed pt coughing blood which RN and Pulmonology aware. Reached out to Pulmonology NP, regarding TF. Hold on initiation for  now. Recommendations provided.   Pt's sister reports that over the last couple months, he has required multiple admission d/t recurrent aspiration, as well as intermittent nausea and vomiting limiting his PO intake. Frequency of n/v unclear  She report pt to have had a weight loss of 15 lbs since May. Unfortunately, cannot confirm this weight loss based on documentation. Within the last year, pt's weight had remained between 64-66 kg. Admit weight is likely stated versus actual measurement. Will continue to monitor throughout admission.   Medications: acidophilus, pepcid, SSI 0-9 units TID, reglan, rena-vit, protonix, IV abx  Labs: sodium 129, BUN 65, Cr 4.19, AST 43, GFR 14, CBG's 75-175 x24 hours, GgbA1c 6.8% (11/2022)  NUTRITION - FOCUSED PHYSICAL EXAM:  Flowsheet Row Most Recent Value  Orbital Region Mild depletion  Upper Arm Region Moderate depletion  Thoracic and Lumbar Region No depletion  Buccal Region Mild depletion  Temple Region No depletion  Clavicle Bone Region Mild depletion  Clavicle and Acromion Bone Region Moderate depletion  Scapular Bone Region Moderate depletion  Dorsal Hand No depletion  Patellar Region Moderate depletion  Anterior Thigh Region Moderate depletion  Posterior Calf Region No depletion  Edema (RD Assessment) None  Hair Reviewed  Eyes Reviewed  Mouth Reviewed  Skin Reviewed  Nails Reviewed       Diet Order:   Diet Order             Diet NPO time specified  Diet effective now                   EDUCATION NEEDS:   No education needs have been identified at this  time  Skin:  Skin Assessment: Reviewed RN Assessment  Last BM:  6/27 per pt sister  Height:   Ht Readings from Last 1 Encounters:  01/06/23 5\' 5"  (1.651 m)    Weight:   Wt Readings from Last 1 Encounters:  01/06/23 71.7 kg   BMI:  Body mass index is 26.29 kg/m.  Estimated Nutritional Needs:   Kcal:  1800-2000  Protein:  95-110g  Fluid:  >/=1.8L  Drusilla Kanner, RDN, LDN Clinical Nutrition

## 2023-01-08 NOTE — Progress Notes (Signed)
Brief Progress Note ABG reviewed.  7.35, 32, 52, 17.7, sat 86% Currently on 10 L HFNC I have called and spoken with RT.  Pt is in no distress, he is alert and oriented x 3 We will increase oxygen to 12 L HFNC and continue to follow closely. Sats currently are 92%

## 2023-01-08 NOTE — Progress Notes (Signed)
PROGRESS NOTE    Travis Beck  RUE:[id number] DOB: May 05, 1946 DOA: 01/06/2023 PCP: Kirstie Peri, MD   Brief Narrative:  Is a 77 year old Caucasian male with a past medical history significant for but limited to diabetes mellitus type 2, hypertension, SIADH, COPD, diabetic neuropathy, recurrent pneumonia as well as other comorbidities who presented with worsening nausea, vomiting and cough as well as shortness of breath.  He is recently hospitalized for multifocal pneumonia and septic shock with MRSA pneumonia and discharged on oral Zyvox and discharged 3 weeks ago to home.  Initially his breathing symptoms improved with Zyvox however he continued to have frequent nauseous and vomiting and coughing episodes.  He described that he felt like food or water did not get stuck in his chest and about 10 to 20 minutes after he eats or drinks that he feels nauseous and throwing up undigested food and he has been throwing up for the last few hours even with clear liquids.  He went to the [facility] ED with his concerns of nausea vomiting and cough and was given IV fluid and symptomatic treatment and discharged home with Zofran and GI cocktail.  Over the next few days his symptoms were better however his nausea vomiting and subsequently became worse so he came back to the ED for further evaluation.  He was admitted for sepsis and found to have a recurrent aspiration and recurrent multifocal pneumonia along with an AKI.  His nausea vomiting is improved and he is given symptomatic treatment.  His respiratory status continued to worsen throughout the day and he decompensated later afternoon and had to be moved to the intensive care unit given that he is at very high risk for being intubated.  GI was consulted and he was made n.p.o. and a cortrak was placed given his significant esophageal dysmotility.  Assessment and Plan:  Aspiration pneumonia Recurrent multifocal pneumonia Acute Respiratory  Failure with Hypoxia, worsened  -Suspect patient has underlying gastroparesis but SLP evaluation done and MBSS done and did show suggest esophageal dysmotility with barium stasis including the distal esophagus  -Continue current antibiotic regimen of vancomycin and cefepime that the patient recieved -SpO2: 98 % O2 Flow Rate (L/min): 60 L/min FiO2 (%): 100 % -Culture sputum -Aspiration precaution -Treatment as Below -The patient will need an Ambulatory home O2 screen prior to discharge and pneumonia looks like it has worsened on x-ray patient clinically decompensated so he was moved to the intensive care unit for further monitoring given that he is a very high risk for intubation   Sepsis -Secondary to pneumonia, evidenced by leukocytosis, elevated lactate -LA and WBC Trend: Recent Labs  Lab 12/12/22 0411 12/30/22 0800 01/06/23 1332 01/06/23 1540 01/07/23 0145 01/08/23 0407 01/08/23 1148 01/08/23 1622  WBC 7.9 13.9* 20.2*  --  15.0* 14.9* 14.9* 17.2*  LATICACIDVEN  --   --   --    < > 1.3  --   --   --    < > = values in this interval not displayed.  -C/w IVF and received NS at 100 mL/hr x 1 Day and now a dose of IV Lasix given that he had to be given some blood -Patient has a urinalysis which showed a cloudy appearance with amber color urine, large hemoglobin, trace leukocytes, negative nitrites, many bacteria, greater than 50 RBCs per high-power field, 0-5 squamous epithelial cells and a WBC of 21-50 -Cultures x 2 pending -Urine culture done and showed No Growth  -Repeat CXR yesterday  AM done and showed "Worsening right lung multilobar opacifications since yesterday, now widespread and newly involving the right upper lobe from earlier this month. Left lower lobe consolidation seen earlier this month probably not substantially improved. Possible small pleural effusions. Bronchoscopy might be valuable." -CXR done today and showed " -IVF now stopped -C/w Xopenex/Atrovent q6h and add  Budesonide and Arfomoterol  -Add Flutter, Incentive, and Guaifenesin  -Will get Pulmonary Evaluation and Recommendations and he subsequently decompensated and has now been moved to the ICU given he is a high risk for being intubated   AKI Metabolic Acidosis -Prerenal likely secondary to volume depletion, likely secondary to repeated nauseous vomiting -BUN/Cr Trend: Recent Labs  Lab 12/12/22 0411 12/30/22 0800 01/06/23 1332 01/06/23 2234 01/07/23 0145 01/08/23 0407 01/08/23 1622  BUN 12 29* 66* 65* 63* 65* 66*  CREATININE 0.94 1.95* 4.20* 3.90* 3.94* 4.19* 4.26*  -Was given IVF Resuscitation and then received a dose of IV Lasix today -Has a CO2 of 17, anion gap of 13, chloride level of 96 -Avoid Nephrotoxic Medications, Contrast Dyes, Hypotension and Dehydration to Ensure Adequate Renal Perfusion and will need to Renally Adjust Meds -Continue to Monitor and Trend Renal Function carefully and repeat CMP in the AM  -Urine Osm was 253 and Urin Sodium was 13 -Checked renal ultrasound and showed "Normal sonographic appearance of bilateral kidneys and bladder."  HypoNatremia, acute on chronic -With history of SIADH -Now patient is volume contracted, will use normal saline to correct volume status first -Na+ Trend: Recent Labs  Lab 12/30/22 0800 01/06/23 1332 01/06/23 2234 01/07/23 0145 01/08/23 0407 01/08/23 1622 01/08/23 1702  NA 131* 128* 128* 126* 129* 126* 126*  -Probably will need sodium chloride tablet once volume status improved and lactic acid level normalized.   Question of gastroparesis with associated Nausea and Vomiting but likely this is in the setting of esophageal dysmotility -Clinically suspected, trial of Reglan every 6 hours -GastroEnterology consult obtained -Other Ddx, speech evaluation rule out other etiology   COPD -Stable -See above    HTN -Hold off home BP meds including Diovan -Continue to Monitor BP per Protocol    IIDM -Change to SSI while  septic patient is now on sensitive NovoLog sliding scale insulin AC -Glucose Trend: Recent Labs  Lab 12/12/22 0411 12/30/22 0800 01/06/23 1332 01/06/23 2234 01/07/23 0145 01/08/23 0407 01/08/23 1622  GLUCOSE 171* 143* 190* 130* 76 70 156*  -CBG Trend: Recent Labs  Lab 01/07/23 0920 01/07/23 1127 01/07/23 1654 01/07/23 1951 01/08/23 0802 01/08/23 1211 01/08/23 1641  GLUCAP 88 154* 175* 117* 75 145* 153*   Normocytic Anemia -Hgb/Hct Trend: Recent Labs  Lab 01/06/23 1332 01/07/23 0145 01/07/23 0414 01/08/23 0407 01/08/23 1148 01/08/23 1622 01/08/23 1702  HGB 8.7* 6.8* 7.7* 6.4* 7.9* 10.0* 9.9*  HCT 26.6* 20.9* 23.6* 19.8* 24.0* 29.6* 29.0*  MCV 88.1 87.4  --  86.8 87.6 83.1  --   -Checked Anemia Panel to the patient receiving blood and iron level was less than 5, UIBC was not calculated, TIBC is 136, saturation ratios were not calculated, ferritin level is 186, folate was 16.7 and vitamin B12 was 682 -Continue to Monitor and Trend for S/Sx of Bleeding; No overt bleeding noted -Repeat CBC in the AM   Thrombocytosis -Likely Reactive -Patients Platelet Count Trend: Recent Labs  Lab 12/12/22 0411 12/30/22 0800 01/06/23 1332 01/07/23 0145 01/08/23 0407 01/08/23 1148 01/08/23 1622  PLT 531* 248 587* 427* 649* 557* 611*  -Continue to Monitor and Trend and  repeat CMP in the AM  GERD/GI Prophylaxis -C/w Pantoprazole 40 mg po Daily  Hypoalbuminemia -Patient's Albumin Trend: Recent Labs  Lab 12/30/22 0800 01/08/23 0407 01/08/23 1622  ALBUMIN 2.4* <1.5* 1.5*  -Continue to Monitor and Trend and repeat CMP in the AM   DVT prophylaxis: SCDs    Code Status: Full Code Family Communication: Discussed with family at bedside  Disposition Plan:  Level of care: ICU Status is: Inpatient Remains inpatient appropriate because: Clinically decompensated and had to be moved to the intensive care unit   Consultants:  Pulmonary Gastroenterology  Procedures:  As  delineated as above  Antimicrobials:  Anti-infectives (From admission, onward)    Start     Dose/Rate Route Frequency Ordered Stop   01/07/23 1215  vancomycin (VANCOCIN) IVPB 1000 mg/200 mL premix        1,000 mg 200 mL/hr over 60 Minutes Intravenous  Once 01/07/23 1117 01/07/23 1343   01/06/23 2200  linezolid (ZYVOX) tablet 600 mg  Status:  Discontinued        600 mg Oral Every 12 hours 01/06/23 1657 01/06/23 1658   01/06/23 1530  vancomycin (VANCOCIN) IVPB 1000 mg/200 mL premix  Status:  Discontinued        1,000 mg 200 mL/hr over 60 Minutes Intravenous  Once 01/06/23 1522 01/06/23 1523   01/06/23 1530  ceFEPIme (MAXIPIME) 2 g in sodium chloride 0.9 % 100 mL IVPB  Status:  Discontinued        2 g 200 mL/hr over 30 Minutes Intravenous  Once 01/06/23 1522 01/06/23 1523   01/06/23 1530  azithromycin (ZITHROMAX) 500 mg in sodium chloride 0.9 % 250 mL IVPB        500 mg 250 mL/hr over 60 Minutes Intravenous  Once 01/06/23 1522 01/06/23 1739   01/06/23 1530  ceFEPIme (MAXIPIME) 2 g in sodium chloride 0.9 % 100 mL IVPB        2 g 200 mL/hr over 30 Minutes Intravenous Every 24 hours 01/06/23 1523     01/06/23 1530  vancomycin (VANCOREADY) IVPB 1500 mg/300 mL        1,500 mg 150 mL/hr over 120 Minutes Intravenous  Once 01/06/23 1523 01/07/23 0014   01/06/23 1529  vancomycin variable dose per unstable renal function (pharmacist dosing)         Does not apply See admin instructions 01/06/23 1529         Subjective: Seen and examined at bedside and patient was not doing as well this morning and had respiratory issues.  Subsequently further continued to decompensate and ended up being placed on a nonrebreather and 15 L high flow nasal cannula.  Given his decompensation and his saturations being in the high 80s he was transferred to the intensive care unit and will need close monitoring given that he is extremely high risk for intubation.  Objective: Vitals:   01/08/23 1646 01/08/23 1647  01/08/23 1700 01/08/23 1800  BP: (!) 131/102 (!) 131/102 119/60 112/61  Pulse: (!) 113 (!) 111 (!) 101 94  Resp: (!) 34 (!) 21 (!) 24 19  Temp: 98.9 F (37.2 C)     TempSrc: Axillary     SpO2: (!) 84% (!) 86% 97% 98%  Weight:      Height:        Intake/Output Summary (Last 24 hours) at 01/08/2023 1905 Last data filed at 01/08/2023 1645 Gross per 24 hour  Intake 1096.03 ml  Output 1650 ml  Net -553.97 ml  Filed Weights   01/06/23 1322  Weight: 71.7 kg   Examination: Physical Exam:  Constitutional: WN/WD overweight ill-appearing Caucasian male and moderate respiratory distress and appears dyspneic Respiratory: Diminished to auscultation bilaterally with coarse breath sounds and significant rhonchi in rales noted.  No appreciable crackles but did have some slight wheezing.  Has increased respiratory effort and he is tachypneic and wearing supplemental oxygen via nasal of high flow and non-rebreather Cardiovascular: Tachycardic rate but regular rhythm, no murmurs / rubs / gallops. S1 and S2 auscultated.  Trace to edema Abdomen: Soft, non-tender, distended secondary to body habitus. Bowel sounds positive.  GU: Deferred. Musculoskeletal: No clubbing / cyanosis of digits/nails. No joint deformity upper and lower extremities.  Skin: No rashes, lesions, ulcers on limited skin evaluation. No induration; Warm and dry.  Neurologic: CN 2-12 grossly intact with no focal deficits. Romberg sign cerebellar reflexes not assessed.  Psychiatric: Anxious mood and affect and he is awake and alert  Data Reviewed: I have personally reviewed following labs and imaging studies  CBC: Recent Labs  Lab 01/06/23 1332 01/07/23 0145 01/07/23 0414 01/08/23 0407 01/08/23 1148 01/08/23 1622 01/08/23 1702  WBC 20.2* 15.0*  --  14.9* 14.9* 17.2*  --   NEUTROABS 18.1*  --   --  11.6*  --  14.4*  --   HGB 8.7* 6.8* 7.7* 6.4* 7.9* 10.0* 9.9*  HCT 26.6* 20.9* 23.6* 19.8* 24.0* 29.6* 29.0*  MCV 88.1 87.4  --   86.8 87.6 83.1  --   PLT 587* 427*  --  649* 557* 611*  --    Basic Metabolic Panel: Recent Labs  Lab 01/06/23 1332 01/06/23 2234 01/07/23 0145 01/08/23 0407 01/08/23 1622 01/08/23 1702  NA 128* 128* 126* 129* 126* 126*  K 3.9 3.8 3.8 3.8 3.9 4.0  CL 93* 94* 98 95* 96*  --   CO2 19* 18* 17* 19* 17*  --   GLUCOSE 190* 130* 76 70 156*  --   BUN 66* 65* 63* 65* 66*  --   CREATININE 4.20* 3.90* 3.94* 4.19* 4.26*  --   CALCIUM 7.8* 7.7* 7.1* 7.8* 7.8*  --   MG  --   --   --  1.7 2.3  --   PHOS  --   --   --  4.0 5.2*  --    GFR: Estimated Creatinine Clearance: 12.2 mL/min (A) (by C-G formula based on SCr of 4.26 mg/dL (H)). Liver Function Tests: Recent Labs  Lab 01/08/23 0407 01/08/23 1622  AST 43* 35  ALT 16 15  ALKPHOS 79 87  BILITOT 0.4 0.5  PROT 6.0* 6.0*  ALBUMIN <1.5* 1.5*   No results for input(s): "LIPASE", "AMYLASE" in the last 168 hours. No results for input(s): "AMMONIA" in the last 168 hours. Coagulation Profile: Recent Labs  Lab 01/06/23 1540  INR 1.2   Cardiac Enzymes: No results for input(s): "CKTOTAL", "CKMB", "CKMBINDEX", "TROPONINI" in the last 168 hours. BNP (last 3 results) No results for input(s): "PROBNP" in the last 8760 hours. HbA1C: No results for input(s): "HGBA1C" in the last 72 hours. CBG: Recent Labs  Lab 01/07/23 1654 01/07/23 1951 01/08/23 0802 01/08/23 1211 01/08/23 1641  GLUCAP 175* 117* 75 145* 153*   Lipid Profile: No results for input(s): "CHOL", "HDL", "LDLCALC", "TRIG", "CHOLHDL", "LDLDIRECT" in the last 72 hours. Thyroid Function Tests: No results for input(s): "TSH", "T4TOTAL", "FREET4", "T3FREE", "THYROIDAB" in the last 72 hours. Anemia Panel: Recent Labs    01/08/23 0407  [id number]  682  FOLATE 16.7  FERRITIN 186  TIBC 136*  IRON <5*  RETICCTPCT 1.6   Sepsis Labs: Recent Labs  Lab 01/06/23 1540 01/06/23 1758 01/06/23 2303 01/07/23 0145  LATICACIDVEN 2.7* 3.7* 2.8* 1.3   Recent Results (from the  past 240 hour(s))  Blood Culture (routine x 2)     Status: None (Preliminary result)   Collection Time: 01/06/23  3:40 PM   Specimen: BLOOD  Result Value Ref Range Status   Specimen Description BLOOD RIGHT ANTECUBITAL  Final   Special Requests   Final    BOTTLES DRAWN AEROBIC AND ANAEROBIC Blood Culture adequate volume   Culture   Final    NO GROWTH 2 DAYS Performed at [facility] Lab, [address], [place], [place] [address]    Report Status PENDING  Incomplete  Blood Culture (routine x 2)     Status: None (Preliminary result)   Collection Time: 01/06/23  3:40 PM   Specimen: BLOOD  Result Value Ref Range Status   Specimen Description BLOOD LEFT ANTECUBITAL  Final   Special Requests   Final    BOTTLES DRAWN AEROBIC AND ANAEROBIC Blood Culture adequate volume   Culture   Final    NO GROWTH 2 DAYS Performed at [facility] Lab, [address], [place], [place] [address]    Report Status PENDING  Incomplete  Resp panel by RT-PCR (RSV, Flu A&B, Covid) Anterior Nasal Swab     Status: None   Collection Time: 01/06/23  4:18 PM   Specimen: Anterior Nasal Swab  Result Value Ref Range Status   SARS Coronavirus 2 by RT PCR NEGATIVE NEGATIVE Final   Influenza A by PCR NEGATIVE NEGATIVE Final   Influenza B by PCR NEGATIVE NEGATIVE Final    Comment: (NOTE) The Xpert Xpress SARS-CoV-2/FLU/RSV plus assay is intended as an aid in the diagnosis of influenza from Nasopharyngeal swab specimens and should not be used as a sole basis for treatment. Nasal washings and aspirates are unacceptable for Xpert Xpress SARS-CoV-2/FLU/RSV testing.  Fact Sheet for Patients: BloggerCourse.com  Fact Sheet for Healthcare Providers: SeriousBroker.it  This test is not yet approved or cleared by the [place] FDA and has been authorized for detection and/or diagnosis of SARS-CoV-2 by FDA under an Emergency Use Authorization (EUA). This EUA will  remain in effect (meaning this test can be used) for the duration of the COVID-19 declaration under Section 564(b)(1) of the Act, 21 U.S.C. section 360bbb-3(b)(1), unless the authorization is terminated or revoked.     Resp Syncytial Virus by PCR NEGATIVE NEGATIVE Final    Comment: (NOTE) Fact Sheet for Patients: BloggerCourse.com  Fact Sheet for Healthcare Providers: SeriousBroker.it  This test is not yet approved or cleared by the [place] FDA and has been authorized for detection and/or diagnosis of SARS-CoV-2 by FDA under an Emergency Use Authorization (EUA). This EUA will remain in effect (meaning this test can be used) for the duration of the COVID-19 declaration under Section 564(b)(1) of the Act, 21 U.S.C. section 360bbb-3(b)(1), unless the authorization is terminated or revoked.  Performed at [facility] Lab, [address] [place], [place] [address]   Urine Culture     Status: None   Collection Time: 01/06/23  4:18 PM   Specimen: Urine, Random  Result Value Ref Range Status   Specimen Description URINE, RANDOM  Final   Special Requests URINE, CLEAN CATCH  Final   Culture   Final    NO GROWTH  Performed at [facility] Lab, [address] [place], [place] [address]    Report Status 01/07/2023 FINAL  Final  MRSA Next Gen by PCR, Nasal     Status: None   Collection Time: 01/08/23  1:41 PM   Specimen: Nasal Mucosa; Nasal Swab  Result Value Ref Range Status   MRSA by PCR Next Gen NOT DETECTED NOT DETECTED Final    Comment: (NOTE) The GeneXpert MRSA Assay (FDA approved for NASAL specimens only), is one component of a comprehensive MRSA colonization surveillance program. It is not intended to diagnose MRSA infection nor to guide or monitor treatment for MRSA infections. Test performance is not FDA approved in patients less than 49 years old. Performed at [facility] Lab, [address], [place],  [place] [address]     Radiology Studies: DG CHEST PORT 1 VIEW  Result Date: 01/08/2023 CLINICAL DATA:  Respiratory failure. EXAM: PORTABLE CHEST 1 VIEW COMPARISON:  Chest radiograph dated 01/08/2023. FINDINGS: Feeding tube extends below the diaphragm with tip beyond the margin of the image. Interval worsening of bilateral pulmonary opacities compared to the earlier radiograph. No large pleural effusion. No pneumothorax. Stable cardiac silhouette no acute osseous pathology. IMPRESSION: Interval worsening of bilateral pulmonary opacities. Electronically Signed   By: Elgie Collard M.D.   On: 01/08/2023 17:45   DG Abd Portable 1V  Result Date: 01/08/2023 CLINICAL DATA:  Feeding tube EXAM: PORTABLE ABDOMEN - 1 VIEW COMPARISON:  12/30/2022 FINDINGS: Enteric tube tip overlies distal stomach. Small amount of residual contrast in the right upper quadrant bowel. Widespread bilateral airspace disease with small pleural effusions. IMPRESSION: Enteric tube tip overlies distal stomach. Electronically Signed   By: Jasmine Pang M.D.   On: 01/08/2023 15:08   DG CHEST PORT 1 VIEW  Result Date: 01/08/2023 CLINICAL DATA:  Shortness of breath.  Pneumonia. EXAM: PORTABLE CHEST 1 VIEW COMPARISON:  01/07/2023 FINDINGS: Stable cardiomediastinal contours. Suspect trace bilateral pleural effusions with blunting of the costophrenic angles. There is unchanged extensive airspace disease throughout the right lung which is most advanced in the right upper lobe. Similar mild left midlung and left lower lobe interstitial and airspace opacities. Osseous structures appear grossly intact. IMPRESSION: 1. No significant interval change. 2. Extensive right lung airspace disease and mild left midlung and left lower lobe interstitial and airspace opacities. 3. Suspect trace bilateral pleural effusions. Electronically Signed   By: Signa Kell M.D.   On: 01/08/2023 08:37   DG Swallowing Func-Speech Pathology  Result Date: 01/07/2023 Table  formatting from the original result was not included. Modified Barium Swallow Study Patient Details Name: Jaimir Sou MRN: [id number] Date of Birth: [birth year] Today's Date: 01/07/2023 HPI/PMH: HPI: Patient is a 77 y.o. male with PMH: HTN, DM-2, COPD, recurrent PNA, esophageal dysphagia (has had EGD's with dilation in 2020 and most recently in 2022). He presented to the hospital on 01/06/2023 with nausea, comiting, cough and SOB. He was recently admitted and discharged back home three weeks ago for multifocal PNA. He presented to [facility]/29/22 with nausea, vomiting and GI cough; symptoms improved but have returned. Clinical Impression: Clinical Impression: Patient presents with a normal oral phase of swallow and a pharyngeal phase that is largely [facility]. His dysphagia appears to be primarily esophageal. During pharyngeal phase, initiation of swallow with thin liquids was delayed at level of vallecular sinus and at times at level of pyriform sinus. In addition, trace amount of thin liquid barium entered laryngeal vestibule but remained well above the  vocal cords and then fully exited. No significant pharyngeal residuals observed post swallows. Epiglottic inversion, anterior hyoid excursion and laryngeal elevation all appeared to be complete.13mm barium tablet transited through PES without difficulty but when esophageal sweep performed, barium stasis, including tablet were observed in distal esophagus. Towards end of study, mild amount of barium observed in upper esophagus below cricopharyngeal bar. No retrograde movement of barium observed. SLP is not recommending further skilled services but is recommending patient f/u with GI for ongoing esophageal dysphagia management. Factors that may increase risk of adverse event in presence of aspiration Rubye Oaks & Clearance Coots 2021): Factors that may increase risk of adverse event in presence of aspiration Rubye Oaks & Clearance Coots 2021): Poor general health and/or compromised immunity;  Frail or deconditioned Recommendations/Plan: Swallowing Evaluation Recommendations Swallowing Evaluation Recommendations Recommendations: PO diet PO Diet Recommendation: Regular; Thin liquids (Level 0) Liquid Administration via: Cup; Straw Medication Administration: Whole meds with liquid Supervision: Patient able to self-feed Swallowing strategies  : Slow rate; Small bites/sips Postural changes: Stay upright 30-60 min after meals; Position pt fully upright for meals Oral care recommendations: Oral care BID (2x/day); Pt independent with oral care Treatment Plan Treatment Plan Treatment recommendations: No treatment recommended at this time Follow-up recommendations: No SLP follow up Functional status assessment: Patient has not had a recent decline in their functional status. Recommendations Recommendations for follow up therapy are one component of a multi-disciplinary discharge planning process, led by the attending physician.  Recommendations may be updated based on patient status, additional functional criteria and insurance authorization. Assessment: Orofacial Exam: Orofacial Exam Oral Cavity: Oral Hygiene: WFL Oral Cavity - Dentition: Adequate natural dentition Anatomy: Anatomy: Prominent cricopharyngeus Boluses Administered: Boluses Administered Boluses Administered: Thin liquids (Level 0); Mildly thick liquids (Level 2, nectar thick); Moderately thick liquids (Level 3, honey thick); Solid; Puree  Oral Impairment Domain: Oral Impairment Domain Lip Closure: No labial escape Tongue control during bolus hold: Not tested Bolus preparation/mastication: Timely and efficient chewing and mashing Bolus transport/lingual motion: Brisk tongue motion Oral residue: Complete oral clearance Location of oral residue : N/A Initiation of pharyngeal swallow : Valleculae; Pyriform sinuses  Pharyngeal Impairment Domain: Pharyngeal Impairment Domain Soft palate elevation: No bolus between soft palate (SP)/pharyngeal wall (PW)  Laryngeal elevation: Complete superior movement of thyroid cartilage with complete approximation of arytenoids to epiglottic petiole Anterior hyoid excursion: Complete anterior movement Epiglottic movement: Complete inversion Laryngeal vestibule closure: Incomplete, narrow column air/contrast in laryngeal vestibule Pharyngeal stripping wave : Present - complete Pharyngeal contraction (A/P view only): N/A Pharyngoesophageal segment opening: Complete distension and complete duration, no obstruction of flow Tongue base retraction: No contrast between tongue base and posterior pharyngeal wall (PPW) Pharyngeal residue: Trace residue within or on pharyngeal structures Location of pharyngeal residue: Valleculae  Esophageal Impairment Domain: Esophageal Impairment Domain Esophageal clearance upright position: Esophageal retention with retrograde flow below pharyngoesophageal segment (PES) Pill: Pill Consistency administered: Thin liquids (Level 0) Thin liquids (Level 0): [facility] Penetration/Aspiration Scale Score: Penetration/Aspiration Scale Score 1.  Material does not enter airway: Mildly thick liquids (Level 2, nectar thick); Moderately thick liquids (Level 3, honey thick); Puree; Solid; Pill 2.  Material enters airway, remains ABOVE vocal cords then ejected out: Thin liquids (Level 0) Compensatory Strategies: Compensatory Strategies Compensatory strategies: No   General Information: Caregiver present: No  Diet Prior to this Study: Regular; Thin liquids (Level 0)   Temperature : Normal   Respiratory Status: WFL   Supplemental O2: None (Room air)   History of Recent Intubation: No  Behavior/Cognition:  Alert; Cooperative; Pleasant mood Self-Feeding Abilities: Able to self-feed Baseline vocal quality/speech: Normal Volitional Cough: Able to elicit Volitional Swallow: Able to elicit Exam Limitations: No limitations Goal Planning: Prognosis for improved oropharyngeal function: Good No data recorded No data recorded Patient/Family  Stated Goal: nothing specific Consulted and agree with results and recommendations: Patient Pain: Pain Assessment Pain Assessment: No/denies pain End of Session: Start Time:SLP Start Time (ACUTE ONLY): 1105 Stop Time: SLP Stop Time (ACUTE ONLY): 1115 Time Calculation:SLP Time Calculation (min) (ACUTE ONLY): 10 min Charges: SLP Evaluations $ SLP Speech Visit: 1 Visit SLP Evaluations $BSS Swallow: 1 Procedure SLP visit diagnosis: SLP Visit Diagnosis: Dysphagia, pharyngoesophageal phase (R13.14) Past Medical History: Past Medical History: Diagnosis Date  Arthritis   Asthma   Childhood  Emphysema lung (HCC)   Essential hypertension   Ischemic heart disease   Myoview 2020 indicating inferior infarct scar with mild peri-infarct ischemia - managed medically  Type 2 diabetes mellitus (HCC)  Past Surgical History: Past Surgical History: Procedure Laterality Date  BIOPSY  08/30/2018  Procedure: BIOPSY;  Surgeon: Malissa Hippo, MD;  [facility];  Service: Endoscopy;;  gastric   BIOPSY  11/05/2020  Procedure: BIOPSY;  Surgeon: Dolores Frame, MD;  [facility];  Service: Gastroenterology;;  COLONOSCOPY    COLONOSCOPY WITH PROPOFOL N/A 11/05/2020  Procedure: COLONOSCOPY WITH PROPOFOL;  Surgeon: Dolores Frame, MD;  [facility];  Service: Gastroenterology;  Laterality: N/A;  Am  ESOPHAGEAL DILATION N/A 05/21/2019  Procedure: ESOPHAGEAL DILATION;  Surgeon: Malissa Hippo, MD;  [facility];  Service: Endoscopy;  Laterality: N/A;  ESOPHAGOGASTRODUODENOSCOPY N/A 05/21/2019  Procedure: ESOPHAGOGASTRODUODENOSCOPY (EGD);  Surgeon: Malissa Hippo, MD;  [facility];  Service: Endoscopy;  Laterality: N/A;  730  ESOPHAGOGASTRODUODENOSCOPY (EGD) WITH PROPOFOL N/A 08/30/2018  Procedure: ESOPHAGOGASTRODUODENOSCOPY (EGD) WITH PROPOFOL;  Surgeon: Malissa Hippo, MD;  [facility];  Service: Endoscopy;  Laterality: N/A;  1:30   ESOPHAGOGASTRODUODENOSCOPY (EGD) WITH PROPOFOL N/A 11/05/2020  Procedure: ESOPHAGOGASTRODUODENOSCOPY (EGD) WITH PROPOFOL;  Surgeon: Dolores Frame, MD;  [facility];  Service: Gastroenterology;  Laterality: N/A;  HERNIA REPAIR    bilateral lower abdomen  POLYPECTOMY  11/05/2020  Procedure: POLYPECTOMY;  Surgeon: Dolores Frame, MD;  [facility];  Service: Gastroenterology;;  Gaspar Bidding DILATION  11/05/2020  Procedure: Gaspar Bidding DILATION;  Surgeon: Dolores Frame, MD;  [facility];  Service: Gastroenterology;; Pablo Lawrence 01/07/2023, 2:05 PM Angela Nevin, MA, CCC-SLP Speech Therapy  DG CHEST PORT 1 VIEW  Result Date: 01/07/2023 CLINICAL DATA:  77 year old male recently diagnosed with pneumonia, left greater than right lower lobe consolidation earlier this month on CT. New right lung abnormality on chest x-ray yesterday. EXAM: PORTABLE CHEST 1 VIEW COMPARISON:  01/06/2023 and earlier. FINDINGS: Portable AP semi upright view at 0855 hours. Increasing confluence of multilobar right lung opacity since yesterday. New involvement of the right upper lobe since the CT earlier this month. Regressed but probably not completely resolved left lower lobe consolidation since the CT. Difficult to exclude small pleural effusions. No pneumothorax or pulmonary edema. Mediastinal contours appear stable, within normal limits. No acute osseous abnormality identified. Negative visible bowel gas. IMPRESSION: 1. Worsening right lung multilobar opacifications since yesterday, now widespread and newly involving the right upper lobe from earlier this month. 2. Left lower lobe consolidation seen earlier this month probably not substantially improved. 3. Possible small pleural effusions. 4. Bronchoscopy might be valuable.  Electronically Signed   By: Odessa Fleming M.D.   On: 01/07/2023 09:02    Scheduled Meds:  sodium chloride   Intravenous Once   acidophilus  1 capsule Oral  Daily   arformoterol  15 mcg Nebulization BID   aspirin EC  81 mg Oral Daily   budesonide (PULMICORT) nebulizer solution  0.5 mg Nebulization BID   Chlorhexidine Gluconate Cloth  6 each Topical Daily   fluticasone  2 spray Each Nare Daily   guaiFENesin  1,200 mg Oral BID   insulin aspart  0-9 Units Subcutaneous TID WC   ipratropium  0.5 mg Nebulization Q6H   levalbuterol  0.63 mg Nebulization Q6H   loratadine  10 mg Oral Daily   metoCLOPramide (REGLAN) injection  10 mg Intravenous Q6H   montelukast  10 mg Oral QHS   pantoprazole (PROTONIX) IV  40 mg Intravenous Q12H   pramipexole  0.5 mg Oral QHS   pravastatin  20 mg Oral QPM   vancomycin variable dose per unstable renal function (pharmacist dosing)   Does not apply See admin instructions   Continuous Infusions:  ceFEPime (MAXIPIME) IV Stopped (01/08/23 1519)   famotidine (PEPCID) IV 20 mg (01/08/23 1735)    LOS: 2 days   Marguerita Merles, DO [facility] Available via Epic secure chat 7am-7pm After these hours, please refer to coverage provider listed on [email or web address] 01/08/2023, 7:05 PM

## 2023-01-08 NOTE — IPAL (Signed)
  Interdisciplinary Goals of Care Family Meeting   Date carried out:: 01/08/2023  Location of the meeting: Bedside  Member's involved: Physician, Bedside Registered Nurse, and Family Member or next of kin  Durable Power of Attorney or acting medical decision maker: self    Discussion: We discussed goals of care for [name] .  [name] has had declining respiratory failure throughout the shift and is now requiring a NRB with increased WOB. We discussed if he would be okay with progressing to intubation and mechanical ventilation if his respiratory failure continues to worsen.  He consented for this.  We discussed that it is hard to predict how long he would require the ventilator, but complicating features for him are the risk of recurrent aspiration due to esophageal dysmotility, recent significant weight loss and likely muscle wasting concurrently, smoking history, advanced age.  We briefly discussed if he would want tracheostomy and prolonged mechanical ventilation if we were unsuccessful in getting him off the ventilator within 2 weeks and he had not thought about this previously.  I discussed this with his 2 sisters and niece at bedside.  They seemed surprised to know that he had had dysphagia for at least the last 2 years since he had an EGD in 2022.  They are frustrated that he has been in the hospital multiple times recently with pneumonia that was most likely due to aspiration.  We discussed that this is something that can be very difficult to treat as an outpatient when associated with esophageal dysmotility.  Currently the most significant threat is his acute severe lung inflammation that seems to be progressing.  There is a greater than 50% chance that he would require intubation over the next 12 hours based on his trajectory today.  I let his family know this.  He has been moved to the intensive care unit will continue all aggressive care measures.  Code status: Full  Code  Disposition: Continue current acute care   Time spent for the meeting: 15 min.  Steffanie Dunn 01/08/2023, 4:48 PM

## 2023-01-08 NOTE — Progress Notes (Signed)
Declining respiratory function today, needed NTS but with minimal output.  He is now requiring a nonrebreather.  BP (!) 131/102   Pulse (!) 111   Temp 98.9 F (37.2 C) (Axillary)   Resp (!) 21   Ht 5\' 5"  (1.651 m)   Wt 71.7 kg   SpO2 (!) 86%   BMI 26.29 kg/m  Symmetric breath sounds, coarse rhales bilaterally, mild abdominal accessory muscle use.  Still able to speak fairly normally.  Moving to ICU.  See ipal note.  High risk for progressing to needing intubation over the next day.  Hold tube feeds for now. Not a candidate for BiPAP due to aspiration concerns.  This patient is critically ill with multiple organ system failure which requires frequent high complexity decision making, assessment, support, evaluation, and titration of therapies. This was completed through the application of advanced monitoring technologies and extensive interpretation of multiple databases. During this encounter critical care time was devoted to patient care services described in this note for 32 minutes.  Steffanie Dunn, DO 01/08/23 5:04 PM [facility]  For contact information, see Amion. If no response to pager, please call PCCM consult pager. After hours, 7PM- 7AM, please call Elink.

## 2023-01-08 NOTE — Progress Notes (Signed)
Initial Nutrition Assessment  DOCUMENTATION CODES:   Not applicable  INTERVENTION:  - Add Glucerna Shake po TID, each supplement provides 220 kcal and 10 grams of protein  - Continue Rena-vit  NUTRITION DIAGNOSIS:   Inadequate oral intake related to poor appetite as evidenced by meal completion < 50%.  GOAL:   Patient will meet greater than or equal to 90% of their needs  MONITOR:   PO intake, Supplement acceptance  REASON FOR ASSESSMENT:   Consult Assessment of nutrition requirement/status  ASSESSMENT:   77 y.o. male admits related to nausea and vomiting, and cough. PMH includes: T2DM, HTN, COPD. Pt is currently receiving medical management related to pneumonia.  Meds reviewed: pepcid, sliding scale insulin, renavit. Labs reviewed: Na low, BUN/Creatinine elevated.    RD unable to reach pt via phone. Spoke with RN who reports that the pt's appetite has been fair. She reports that he is eating about 50% of his meals. RD will add Glucerna shakes. Will continue to monitor PO intakes.   NUTRITION - FOCUSED PHYSICAL EXAM:  Remote assessment.  Diet Order:   Diet Order             DIET SOFT Room service appropriate? Yes; Fluid consistency: Thin  Diet effective now                   EDUCATION NEEDS:   Not appropriate for education at this time  Skin:  Skin Assessment: Reviewed RN Assessment  Last BM:     Height:   Ht Readings from Last 1 Encounters:  01/06/23 5\' 5"  (1.651 m)    Weight:   Wt Readings from Last 1 Encounters:  01/06/23 71.7 kg    Ideal Body Weight:     BMI:  Body mass index is 26.29 kg/m.  Estimated Nutritional Needs:   Kcal:  1800-2370 kcals  Protein:  90-115 gm  Fluid:  1.8-2.3 L  Bethann Humble, RD, LDN, CNSC.

## 2023-01-08 NOTE — Progress Notes (Signed)
FL Test requested - Esophogram  77 y.o. male inpatient. Admitted with recurrent PNA. Team is requesting an esophogram for further evaluation of aspiration PNA. Now with an increase in oxygen demand. Changed from 8 to 10 L high flow via [place] today. Baseline is 3 L.   Case discussed with [facility] Attending Dr. Jackey Loge who recommends patient be more stable prior to diagnostic test being performed. This was communicated directly to the ordering provider via EPIC Chat

## 2023-01-08 NOTE — Progress Notes (Signed)
Pharmacy Antibiotic Note- follow-up  Travis Beck is a 77 y.o. male admitted on 01/06/2023 with pneumonia.  Pharmacy has been consulted for Cefepime and Vancomycin dosing.  Initial assessment (6/29): WBC 20.2, afebrile SCr 4.20 (baseline SCr ~0.7) MRSA PCR positive on 11/30/22 and 12/07/22 Patient recently took linezolid 600mg  po BID x 7 days from 12/13/22>12/19/22  6/30 AM update: Vanco random 17 mcg/mL @ 10:05 Vancomycin 1g IV x1    7/1 AM update: Vanco random 28 mcg/mL @ 0400 Scr remains elevated at 4.19   Plan: No vancomycin dose today- dosing per unstable RF- variable doses F/up vanco random AM labs Continue Cefepime 2g IV q24h Monitor daily CBC, temp, SCr, and for clinical signs of improvement  F/u cultures and de-escalate antibiotics as able   Height: 5\' 5"  (165.1 cm) Weight: 71.7 kg (158 lb) IBW/kg (Calculated) : 61.5  Temp (24hrs), Avg:98.5 F (36.9 C), Min:98 F (36.7 C), Max:99 F (37.2 C)  Recent Labs  Lab 01/06/23 1332 01/06/23 1540 01/06/23 1758 01/06/23 2234 01/06/23 2303 01/07/23 0145 01/07/23 1005 01/08/23 0407  WBC 20.2*  --   --   --   --  15.0*  --  14.9*  CREATININE 4.20*  --   --  3.90*  --  3.94*  --  4.19*  LATICACIDVEN  --  2.7* 3.7*  --  2.8* 1.3  --   --   VANCORANDOM  --   --   --   --   --   --  17 28     Estimated Creatinine Clearance: 12.4 mL/min (A) (by C-G formula based on SCr of 4.19 mg/dL (H)).    Allergies  Allergen Reactions   Penicillins Rash    No problems with ampicillin during hospitalization    Antimicrobials this admission: Cefepime 6/29 >>  Azithromycin 6/29 x1 dose Vancomycin 6/29 >>   Microbiology results: 6/29 BCx: ngtd<24 hrs 6/29 ucx- ngtd  Thank you for allowing pharmacy to be a part of this patient's care.  Andreas Ohm, PharmD Pharmacy Resident  01/08/2023 9:39 AM

## 2023-01-08 NOTE — Progress Notes (Signed)
eLink Physician-Brief Progress Note Patient Name: Travis Beck DOB: [birth year] MRN: [id number]   Date of Service  01/08/2023  HPI/Events of Note  Post intubation ABG shows combined respiratory and metabolic acidosis.  pO2 62 despite FiO2 100%  eICU Interventions  Back up rate increased to 24, PEEP to 8 Also ordered for 2amps sodium bicarb to be given.         Coren Crownover M DELA CRUZ 01/08/2023, 10:00 PM  1:02 AM Repeat ABG shows improved oxygenation.  Still acidemic, but overall improved.  Placed order to give 1 more amp of bicarb.  BP soft following sedation.  Will give volume challenge with albumin.  New peripheral IV being inserted for possible need of vasopressor support.

## 2023-01-08 NOTE — Progress Notes (Signed)
PCCM Update:  Called to the bedside due to respiratory distress. Patient was using accessory muscles, SpO2 77-82%. Discussed intubation with patient and he wanted to proceed. Reviewed chart briefly and proceeded with intubation.   Updated patient's grandson after the procedure.   Melody Comas, MD [facility] Office: [phone]   See Amion for personal pager PCCM on call pager [phone] until 7pm. Please call Elink 7p-7a. [phone]

## 2023-01-09 DIAGNOSIS — K219 Gastro-esophageal reflux disease without esophagitis: Secondary | ICD-10-CM | POA: Diagnosis not present

## 2023-01-09 DIAGNOSIS — R1310 Dysphagia, unspecified: Secondary | ICD-10-CM

## 2023-01-09 DIAGNOSIS — R1319 Other dysphagia: Secondary | ICD-10-CM | POA: Diagnosis not present

## 2023-01-09 DIAGNOSIS — J189 Pneumonia, unspecified organism: Secondary | ICD-10-CM | POA: Diagnosis not present

## 2023-01-09 DIAGNOSIS — J9601 Acute respiratory failure with hypoxia: Secondary | ICD-10-CM | POA: Diagnosis not present

## 2023-01-09 DIAGNOSIS — R112 Nausea with vomiting, unspecified: Secondary | ICD-10-CM | POA: Diagnosis not present

## 2023-01-09 DIAGNOSIS — J441 Chronic obstructive pulmonary disease with (acute) exacerbation: Secondary | ICD-10-CM | POA: Diagnosis not present

## 2023-01-09 DIAGNOSIS — J690 Pneumonitis due to inhalation of food and vomit: Secondary | ICD-10-CM | POA: Diagnosis not present

## 2023-01-09 DIAGNOSIS — D649 Anemia, unspecified: Secondary | ICD-10-CM | POA: Diagnosis not present

## 2023-01-09 LAB — POCT I-STAT 7, (LYTES, BLD GAS, ICA,H+H)
Acid-base deficit: 6 mmol/L — ABNORMAL HIGH (ref 0.0–2.0)
Bicarbonate: 21.3 mmol/L (ref 20.0–28.0)
Calcium, Ion: 1.09 mmol/L — ABNORMAL LOW (ref 1.15–1.40)
HCT: 26 % — ABNORMAL LOW (ref 39.0–52.0)
Hemoglobin: 8.8 g/dL — ABNORMAL LOW (ref 13.0–17.0)
O2 Saturation: 99 %
Patient temperature: 98.6
Potassium: 4.1 mmol/L (ref 3.5–5.1)
Sodium: 128 mmol/L — ABNORMAL LOW (ref 135–145)
TCO2: 23 mmol/L (ref 22–32)
pCO2 arterial: 52.9 mmHg — ABNORMAL HIGH (ref 32–48)
pH, Arterial: 7.213 — ABNORMAL LOW (ref 7.35–7.45)
pO2, Arterial: 170 mmHg — ABNORMAL HIGH (ref 83–108)

## 2023-01-09 LAB — CBC
HCT: 23.3 % — ABNORMAL LOW (ref 39.0–52.0)
Hemoglobin: 7.8 g/dL — ABNORMAL LOW (ref 13.0–17.0)
MCH: 28.7 pg (ref 26.0–34.0)
MCHC: 33.5 g/dL (ref 30.0–36.0)
MCV: 85.7 fL (ref 80.0–100.0)
Platelets: 474 10*3/uL — ABNORMAL HIGH (ref 150–400)
RBC: 2.72 MIL/uL — ABNORMAL LOW (ref 4.22–5.81)
RDW: 15.1 % (ref 11.5–15.5)
WBC: 17.6 10*3/uL — ABNORMAL HIGH (ref 4.0–10.5)
nRBC: 0 % (ref 0.0–0.2)

## 2023-01-09 LAB — GLUCOSE, CAPILLARY
Glucose-Capillary: 173 mg/dL — ABNORMAL HIGH (ref 70–99)
Glucose-Capillary: 157 mg/dL — ABNORMAL HIGH (ref 70–99)
Glucose-Capillary: 134 mg/dL — ABNORMAL HIGH (ref 70–99)
Glucose-Capillary: 184 mg/dL — ABNORMAL HIGH (ref 70–99)
Glucose-Capillary: 228 mg/dL — ABNORMAL HIGH (ref 70–99)
Glucose-Capillary: 211 mg/dL — ABNORMAL HIGH (ref 70–99)

## 2023-01-09 LAB — TRIGLYCERIDES: Triglycerides: 115 mg/dL (ref <150)

## 2023-01-09 LAB — BASIC METABOLIC PANEL
Anion gap: 17 — ABNORMAL HIGH (ref 5–15)
BUN: 68 mg/dL — ABNORMAL HIGH (ref 8–23)
CO2: 19 mmol/L — ABNORMAL LOW (ref 22–32)
Calcium: 7.4 mg/dL — ABNORMAL LOW (ref 8.9–10.3)
Chloride: 94 mmol/L — ABNORMAL LOW (ref 98–111)
Creatinine, Ser: 4.43 mg/dL — ABNORMAL HIGH (ref 0.61–1.24)
GFR, Estimated: 13 mL/min — ABNORMAL LOW (ref >60)
Glucose, Bld: 180 mg/dL — ABNORMAL HIGH (ref 70–99)
Potassium: 3.8 mmol/L (ref 3.5–5.1)
Sodium: 130 mmol/L — ABNORMAL LOW (ref 135–145)

## 2023-01-09 LAB — TYPE AND SCREEN
Antibody Screen: NEGATIVE
Unit division: 0

## 2023-01-09 LAB — BPAM RBC
ISSUE DATE / TIME: 202407011218
Unit Type and Rh: 7300

## 2023-01-09 LAB — MAGNESIUM
Magnesium: 2.1 mg/dL (ref 1.7–2.4)
Magnesium: 2.2 mg/dL (ref 1.7–2.4)

## 2023-01-09 LAB — PHOSPHORUS: Phosphorus: 7.4 mg/dL — ABNORMAL HIGH (ref 2.5–4.6)

## 2023-01-09 LAB — VANCOMYCIN, RANDOM: Vancomycin Rm: 17 ug/mL

## 2023-01-09 MED ORDER — POLYETHYLENE GLYCOL 3350 17 G PO PACK
17.0000 g | PACK | Freq: Two times a day (BID) | ORAL | Status: DC
  Administered 2023-01-09 (×2): 17 g
  Filled 2023-01-09 (×2): qty 1

## 2023-01-09 MED ORDER — SODIUM CHLORIDE 0.9 % IV SOLN
250.0000 mL | INTRAVENOUS | Status: DC
  Administered 2023-01-09: 250 mL via INTRAVENOUS

## 2023-01-09 MED ORDER — PROSOURCE TF20 ENFIT COMPATIBL EN LIQD
60.0000 mL | Freq: Every day | ENTERAL | Status: DC
  Administered 2023-01-09 – 2023-01-12 (×4): 60 mL
  Filled 2023-01-09 (×4): qty 60

## 2023-01-09 MED ORDER — NOREPINEPHRINE 4 MG/250ML-% IV SOLN
INTRAVENOUS | Status: AC
  Filled 2023-01-09: qty 250

## 2023-01-09 MED ORDER — NOREPINEPHRINE 4 MG/250ML-% IV SOLN
2.0000 ug/min | INTRAVENOUS | Status: DC
  Administered 2023-01-09: 6 ug/min via INTRAVENOUS
  Administered 2023-01-09: 4 ug/min via INTRAVENOUS
  Administered 2023-01-10: 2 ug/min via INTRAVENOUS
  Filled 2023-01-09 (×2): qty 250

## 2023-01-09 MED ORDER — OSMOLITE 1.5 CAL PO LIQD
1000.0000 mL | ORAL | Status: DC
  Administered 2023-01-09 – 2023-01-11 (×2): 1000 mL
  Filled 2023-01-09 (×2): qty 1000

## 2023-01-09 MED ORDER — METOCLOPRAMIDE HCL 5 MG/ML IJ SOLN
5.0000 mg | Freq: Three times a day (TID) | INTRAMUSCULAR | Status: DC
  Administered 2023-01-09 – 2023-01-12 (×9): 5 mg via INTRAVENOUS
  Filled 2023-01-09 (×8): qty 2

## 2023-01-09 MED ORDER — THIAMINE MONONITRATE 100 MG PO TABS
100.0000 mg | ORAL_TABLET | Freq: Every day | ORAL | Status: AC
  Administered 2023-01-09 – 2023-01-15 (×7): 100 mg
  Filled 2023-01-09 (×7): qty 1

## 2023-01-09 MED ORDER — SENNA 8.6 MG PO TABS
1.0000 | ORAL_TABLET | Freq: Every day | ORAL | Status: DC
  Administered 2023-01-09: 8.6 mg
  Filled 2023-01-09: qty 1

## 2023-01-09 MED ORDER — VANCOMYCIN HCL IN DEXTROSE 1-5 GM/200ML-% IV SOLN
1000.0000 mg | Freq: Once | INTRAVENOUS | Status: DC
  Filled 2023-01-09: qty 200

## 2023-01-09 MED ORDER — INSULIN ASPART 100 UNIT/ML IJ SOLN
0.0000 [IU] | INTRAMUSCULAR | Status: DC
  Administered 2023-01-09 – 2023-01-10 (×2): 3 [IU] via SUBCUTANEOUS

## 2023-01-09 MED ORDER — PRAMIPEXOLE DIHYDROCHLORIDE 0.25 MG PO TABS
0.5000 mg | ORAL_TABLET | Freq: Every day | ORAL | Status: DC
  Administered 2023-01-10 – 2023-01-24 (×15): 0.5 mg
  Filled 2023-01-09 (×16): qty 2

## 2023-01-09 MED ORDER — SODIUM BICARBONATE 8.4 % IV SOLN
50.0000 meq | Freq: Once | INTRAVENOUS | Status: AC
  Administered 2023-01-09: 50 meq via INTRAVENOUS
  Filled 2023-01-09: qty 50

## 2023-01-09 MED ORDER — VANCOMYCIN HCL 750 MG/150ML IV SOLN
750.0000 mg | Freq: Once | INTRAVENOUS | Status: AC
  Administered 2023-01-09: 750 mg via INTRAVENOUS
  Filled 2023-01-09: qty 150

## 2023-01-09 MED ORDER — ALBUMIN HUMAN 25 % IV SOLN
25.0000 g | Freq: Once | INTRAVENOUS | Status: AC
  Administered 2023-01-09: 25 g via INTRAVENOUS
  Filled 2023-01-09: qty 100

## 2023-01-09 MED ORDER — FAMOTIDINE 20 MG PO TABS
10.0000 mg | ORAL_TABLET | Freq: Every day | ORAL | Status: DC
  Administered 2023-01-10 – 2023-01-12 (×3): 10 mg
  Filled 2023-01-09 (×3): qty 1

## 2023-01-09 MED ORDER — HYDROCORTISONE SOD SUC (PF) 100 MG IJ SOLR
100.0000 mg | Freq: Two times a day (BID) | INTRAMUSCULAR | Status: DC
  Administered 2023-01-09 – 2023-01-10 (×4): 100 mg via INTRAVENOUS
  Filled 2023-01-09 (×4): qty 2

## 2023-01-09 NOTE — Progress Notes (Signed)
An USGPIV (ultrasound guided PIV) has been placed for short-term vasopressor infusion. A correctly placed ivWatch must be used when administering vasopressors. Should this treatment be needed beyond 72 hours, central line access should be obtained.  It will be the responsibility of the bedside nurse to follow best practice to prevent extravasations.   

## 2023-01-09 NOTE — Consult Note (Signed)
Consultation Note Date: 01/09/2023   Patient Name: Travis Beck  DOB: [birth year]  MRN: [id number]  Age / Sex: 77 y.o., male  PCP: Travis Peri, MD Referring Physician: Steffanie Dunn, DO  Reason for Consultation: Establishing goals of care  HPI/Patient Profile: 77 y.o. male   admitted on 01/06/2023 with past   medical history significant of IIDM, HTN, SIDAH, COPD,  former smoker quit 1994 with a 66 pack year smoking history, diabetic neuropathy, recurrent pneumonia presented 01/06/2023 with worsening nausea /, cough and shortness of breath.   Patient was recently hospitalized 5/23-5/26 for multifocal MRSA pneumonia  and septic shock requiring vasopressor on admission-stabilized and subsequently discharged home on Augmentin/doxycycline .   Pt returned to [facility] 12/07/2022 with fever of 103.1 and ongoing left sided pleuritic pain. He has a small effusion that was not big enough to tap.  He was admitted for incompletely treated PNA-as sputum cultures finalized postdischarge, and Doxycycline did not cover MRSA. He was treated with Zyvox, and discharged home on Zyvox p.o on 12/13/2022.   Initially his breathing symptoms improved with Zyvox however patient continued to experience frequent nausea, vomiting and cough. He presented to the ED 6/29 as these symptoms were worsening.   In the ED he was mildly febrile at 99.6, He had  tachycardia, oxygen sats were 93% on 3 L. CXR again showed a multifocal pneumonia, right sided.   Family report slow physical and functional decline.  Patient admitted for treatment and stabilization.  Family face treatment option decisions, advanced directive decisions and anticipatory care needs.    Clinical Assessment and Goals of Care:  This NP Travis Beck reviewed medical records, received report from team, assessed the patient and then meet at the patient's bedside  along with his family to include his sister Travis Beck, sister/Travis Beck, nieces Travis Beck, Travis Beck and Travis Beck to discuss diagnosis, prognosis, GOC,  disposition and options.   Concept of Palliative Care was introduced as specialized medical care for people and their families living with serious illness.  If focuses on providing relief from the symptoms and stress of a serious illness.  The goal is to improve quality of life for both the patient and the family.   Values and goals of care important to patient and family were attempted to be elicited.  Created space and opportunity for family to explore thoughts and feelings regarding current medical situation.    Family present express love and appreciation for Travis Beck.  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  There is mention of concern that Travis Beck need extra support, "he doesn't always really comprehend and he cannot read or write".  All family members expressed concern for Travis. Beck.  They understand the seriousness of his current medical situation but remain hopeful for improvement to baseline.  All verbalize openness to all offered and available medical interventions to prolong life.  On further  exploration family are recognizing  patient's multiple hospitalizations, significant weight loss and recurrent pulmonary infections.  Education offered on dysphagia and associated aspiration as it relates to recurring URI.     A  discussion was had today regarding advanced directives.  Concepts specific to code status, artifical feeding and hydration, continued IV antibiotics and rehospitalization was had.    The difference between a aggressive medical intervention path  and a palliative comfort care path for this patient at this time was had.    Education offered on best case scenario vs worst case scenario and the importance of ongoing  conversation depending on patient outcomes over the next days and weeks.  MOST form introduced   Natural trajectory and expectations at EOL were discussed.  Questions and concerns addressed.  Patient  encouraged to call with questions or concerns.     PMT will continue to support holistically.         No documented H POA.  Patient does have one living daughter/Travis Beck however she is not present here in the hospital and family verbalized that she has very little interaction with her father.  All family members present believe that the patient's sibling should be the main decision maker.Marland Kitchen  Ultimately family at bedside hope that Travis. Earle Beck will improve to the point where he can make his own decisions and hopefully document an H POA at that time.  This nurse practitioner will attempt to reach out to patient's biological daughter to determine her interest in participating in decisions with her father.    Hopefully Travis. Beck will improve to the point where he can make his own decisions.     SUMMARY OF RECOMMENDATIONS    Code Status/Advance Care Planning: Full code    Palliative Prophylaxis:  Aspiration, Delirium Protocol, and Frequent Pain Assessment  Additional Recommendations (Limitations, Scope, Preferences): Full Scope Treatment  Psycho-social/Spiritual:  Desire for further Chaplaincy support:yes-family is interested at some point in time to attempt securing H POA   Prognosis:  Unable to determine  Discharge Planning: To Be Determined      Primary Diagnoses: Present on Admission:  Aspiration pneumonia (HCC)  Acute exacerbation of COPD with asthma (HCC)  Acute respiratory failure with hypoxia (HCC)  Pneumonia   I have reviewed the medical record, interviewed the patient and family, and examined the patient. The following aspects are pertinent.  Past Medical History:  Diagnosis Date   Arthritis    Asthma    Childhood   Emphysema lung (HCC)    Essential  hypertension    Ischemic heart disease    Myoview 2020 indicating inferior infarct scar with mild Beck-infarct ischemia - managed medically   Type 2 diabetes mellitus (HCC)    Social History   Socioeconomic History   Marital status: Widowed    Spouse name: Not on file   Number of children: Not on file   Years of education: Not on file   Highest education level: Not on file  Occupational History   Not on file  Tobacco Use   Smoking status: Former    Packs/day: 2.00    Years: 33.00    Additional pack years: 0.00    Total pack years: 66.00    Types: Cigarettes    Quit date: 01/22/1993    Years since quitting: 29.9   Smokeless tobacco: Never  Vaping Use   Vaping Use: Never used  Substance and Sexual Activity   Alcohol use: Not Currently   Drug use: Never  Sexual activity: Not on file  Other Topics Concern   Not on file  Social History Narrative   Not on file   Social Determinants of Health   Financial Resource Strain: Low Risk  (09/27/2022)   Overall Financial Resource Strain (CARDIA)    Difficulty of Paying Living Expenses: Not very hard  Food Insecurity: No Food Insecurity (12/08/2022)   Hunger Vital Sign    Worried About Running Out of Food in the Last Year: Never true    Ran Out of Food in the Last Year: Never true  Transportation Needs: No Transportation Needs (12/08/2022)   PRAPARE - Administrator, Civil Service (Medical): No    Lack of Transportation (Non-Medical): No  Physical Activity: Inactive (07/31/2022)   Exercise Vital Sign    Days of Exercise per Week: 0 days    Minutes of Exercise per Session: 0 min  Stress: Not on file  Social Connections: Not on file   History reviewed. No pertinent family history. Scheduled Meds:  arformoterol  15 mcg Nebulization BID   aspirin  81 mg Per Tube Daily   budesonide (PULMICORT) nebulizer solution  0.5 mg Nebulization BID   Chlorhexidine Gluconate Cloth  6 each Topical Daily   docusate  100 mg Per Tube  BID   [START ON 01/10/2023] famotidine  10 mg Per Tube Daily   fluticasone  2 spray Each Nare Daily   guaiFENesin  15 mL Per Tube Q4H   hydrocortisone sod succinate (SOLU-CORTEF) inj  100 mg Intravenous Q12H   insulin aspart  0-9 Units Subcutaneous TID WC   loratadine  10 mg Per Tube Daily   metoCLOPramide (REGLAN) injection  5 mg Intravenous Q8H   montelukast  10 mg Per Tube QHS   pantoprazole (PROTONIX) IV  40 mg Intravenous Q12H   polyethylene glycol  17 g Per Tube BID   pramipexole  0.5 mg Oral QHS   pravastatin  20 mg Per Tube QPM   revefenacin  175 mcg Nebulization Daily   saccharomyces boulardii  250 mg Per Tube BID   senna  1 tablet Per Tube QHS   sodium bicarbonate       sodium chloride HYPERTONIC  4 mL Nebulization BID   vancomycin variable dose per unstable renal function (pharmacist dosing)   Does not apply See admin instructions   Continuous Infusions:  sodium chloride 10 mL/hr at 01/09/23 0800   ceFEPime (MAXIPIME) IV Stopped (01/08/23 1519)   fentaNYL infusion INTRAVENOUS 50 mcg/hr (01/09/23 0800)   norepinephrine (LEVOPHED) Adult infusion 4 mcg/min (01/09/23 0800)   propofol (DIPRIVAN) infusion 15 mcg/kg/min (01/09/23 0852)   PRN Meds:.acetaminophen, albuterol, ALPRAZolam, fentaNYL, gabapentin, sodium bicarbonate Medications Prior to Admission:  Prior to Admission medications   Medication Sig Start Date End Date Taking? Authorizing Provider  acetaminophen (TYLENOL) 325 MG tablet Take 2 tablets (650 mg total) by mouth every 6 (six) hours as needed for mild pain (or Fever >/= 101). 11/26/19  Yes Emokpae, Courage, MD  albuterol (VENTOLIN HFA) 108 (90 Base) MCG/ACT inhaler Inhale 2 puffs into the lungs every 4 (four) hours as needed for wheezing or shortness of breath. Patient taking differently: Inhale 1 puff into the lungs every 4 (four) hours as needed for wheezing or shortness of breath. 11/26/19  Yes Emokpae, Courage, MD  ALPRAZolam Prudy Feeler) 1 MG tablet Take 1 tablet (1  mg total) by mouth 3 (three) times daily as needed for anxiety. Patient taking differently: Take 1-2 mg by mouth at  bedtime as needed for anxiety or sleep. 01/28/19  Yes Welborn, Ryan, DO  amLODipine (NORVASC) 10 MG tablet Take 10 mg by mouth at bedtime.   Yes [provider]  Budeson-Glycopyrrol-Formoterol (BREZTRI AEROSPHERE) 160-9-4.8 MCG/ACT AERO Inhale 2 puffs into the lungs in the morning and at bedtime. 12/13/22  Yes Leroy Sea, MD  buprenorphine (SUBUTEX) 8 MG SUBL SL tablet Place 4 mg under the tongue as needed (pain). 01/04/19  Yes [provider]  cetirizine (ZYRTEC) 10 MG tablet Take 10 mg by mouth at bedtime.   Yes [provider]  famotidine (PEPCID) 20 MG tablet Take 1 tablet (20 mg total) by mouth 2 (two) times daily for 7 days. Take one tablet twice daily for two days Patient taking differently: Take 20 mg by mouth as needed for heartburn or indigestion. 12/30/22 01/06/23 Yes Gerhard Munch, MD  fluticasone [facility]) 50 MCG/ACT nasal spray Place 1 spray into both nostrils daily. 08/22/18  Yes [provider]  gabapentin (NEURONTIN) 400 MG capsule Take 1 capsule (400 mg total) by mouth every 8 (eight) hours as needed. Patient taking differently: Take 400-800 mg by mouth as needed (pain). 01/28/19  Yes Welborn, Ryan, DO  glipiZIDE (GLUCOTROL XL) 10 MG 24 hr tablet Take 10 mg by mouth daily. 11/06/22  Yes [provider]  guaiFENesin (MUCINEX) 600 MG 12 hr tablet Take 1 tablet (600 mg total) by mouth 2 (two) times daily as needed for cough. Patient taking differently: Take 600 mg by mouth 2 (two) times daily as needed for cough or to loosen phlegm. 12/13/22  Yes Leroy Sea, MD  metFORMIN (GLUCOPHAGE) 1000 MG tablet Take 1,000 mg by mouth 2 (two) times daily with a meal.  12/07/14  Yes [provider]  montelukast (SINGULAIR) 10 MG tablet Take 1 tablet (10 mg total) by mouth at bedtime. 06/15/22  Yes Mannam, Praveen, MD   ondansetron (ZOFRAN-ODT) 4 MG disintegrating tablet Take 1 tablet (4 mg total) by mouth every 8 (eight) hours as needed for nausea or vomiting. Consider taking 30 minutes prior to meals. Patient taking differently: Take 4 mg by mouth as needed for nausea or vomiting. 12/30/22  Yes Gerhard Munch, MD  pantoprazole (PROTONIX) 40 MG tablet Take 1 tablet (40 mg total) by mouth daily. 12/15/20  Yes Dolores Frame, MD  pramipexole (MIRAPEX) 0.5 MG tablet Take 0.5 mg by mouth at bedtime. 11/04/22  Yes [provider]  pravastatin (PRAVACHOL) 20 MG tablet Take 20 mg by mouth every evening.   Yes [provider]  sucralfate (CARAFATE) 1 g tablet Take 1 tablet (1 g total) by mouth 4 (four) times daily -  with meals and at bedtime. 12/30/22  Yes Gerhard Munch, MD  valsartan (DIOVAN) 160 MG tablet Take 160 mg by mouth daily. 11/06/22  Yes [provider]  aspirin EC 81 MG tablet Take 1 tablet (81 mg total) by mouth daily. Swallow whole. Patient not taking: Reported on 01/06/2023 09/30/20   Jonelle Sidle, MD  linezolid (ZYVOX) 600 MG tablet Take 1 tablet (600 mg total) by mouth every 12 (twelve) hours. Patient not taking: Reported on 01/06/2023 12/13/22   Leroy Sea, MD   Allergies  Allergen Reactions   Penicillins Rash    No problems with ampicillin during hospitalization   Review of Systems  Unable to perform ROS: Intubated    Physical Exam Constitutional:      Appearance: He is ill-appearing.     Interventions: He is  intubated.  Cardiovascular:     Rate and Rhythm: Normal rate.  Pulmonary:     Effort: Pulmonary effort is normal. He is intubated.  Musculoskeletal:     Comments: Generalized weakness and muscle atrophy  Skin:    General: Skin is warm and dry.     Vital Signs: BP 95/60   Pulse 60   Temp (!) 97.2 F (36.2 C) (Axillary)   Resp (!) 24   Ht 5\' 5"  (1.651 m)   Wt 71.7 kg   SpO2 100%   BMI 26.29 kg/m  Pain Scale: CPOT POSS *See  Group Information*: 1-Acceptable,Awake and alert Pain Score: 0-No pain   SpO2: SpO2: 100 % O2 Device:SpO2: 100 % O2 Flow Rate: .O2 Flow Rate (L/min): 60 L/min  IO: Intake/output summary:  Intake/Output Summary (Last 24 hours) at 01/09/2023 0953 Last data filed at 01/09/2023 0800 Gross per 24 hour  Intake 1531.01 ml  Output 750 ml  Net 781.01 ml    LBM:   Baseline Weight: Weight: 71.7 kg Most recent weight: Weight: 71.7 kg     Palliative Assessment/Data:    Time:    90 minutes    Discussed with bedside RN  Signed by: Travis Creed, NP   Please contact Palliative Medicine Team phone at [phone] for questions and concerns.  For individual provider: See Loretha Stapler

## 2023-01-09 NOTE — Progress Notes (Signed)
eLink Physician-Brief Progress Note Patient Name: Travis Beck DOB: 1946/06/07 MRN: [id number]   Date of Service  01/09/2023  HPI/Events of Note  Notified of hypotension.  Given bolus of albumin without response.   eICU Interventions  Will start low dose peripheral levophed.  Will cotninue to monitor hemodynamics.         [name] 01/09/2023, 1:57 AM

## 2023-01-09 NOTE — Progress Notes (Deleted)
Pharmacy Antibiotic Note- follow-up  Travis Beck is a 77 y.o. male admitted on 01/06/2023 with pneumonia.  Pharmacy has been consulted for Cefepime and Vancomycin dosing. Patient with worsening of respiratory status requiring intubation 7/1 PM.   Last dose of vancomycin on 6/30 with 1g of vancomycin for vancomycin level of 17. 7/1 Random level was 28. Random AM level today is 17.   Plan: Will give another 1g of vancomycin this morning. Will hold off on schedule regimen, Scr increasing and patient now requiring pressors.  Continue Cefepime 2g IV q24h Monitor daily CBC, temp, SCr, and for clinical signs of improvement  F/u cultures and de-escalate antibiotics as able   Height: 5\' 5"  (165.1 cm) Weight: 71.7 kg (158 lb) IBW/kg (Calculated) : 61.5  Temp (24hrs), Avg:98.6 F (37 C), Min:97.8 F (36.6 C), Max:99 F (37.2 C)  Recent Labs  Lab 01/06/23 1540 01/06/23 1758 01/06/23 2234 01/06/23 2303 01/07/23 0145 01/07/23 1005 01/08/23 0407 01/08/23 1148 01/08/23 1622 01/09/23 0135  WBC  --   --   --   --  15.0*  --  14.9* 14.9* 17.2* 17.6*  CREATININE  --   --  3.90*  --  3.94*  --  4.19*  --  4.26* 4.43*  LATICACIDVEN 2.7* 3.7*  --  2.8* 1.3  --   --   --   --   --   VANCORANDOM  --   --   --   --   --    < > 28  --   --  17   < > = values in this interval not displayed.     Estimated Creatinine Clearance: 11.8 mL/min (A) (by C-G formula based on SCr of 4.43 mg/dL (H)).    Allergies  Allergen Reactions   Penicillins Rash    No problems with ampicillin during hospitalization   Estill Batten, PharmD, BCCCP  Clinical Pharmacist 01/09/2023 2:50 AM  Contact: [phone] after 3 PM

## 2023-01-09 NOTE — Progress Notes (Signed)
Pharmacy Antibiotic Note- follow-up  Travis Beck is a 77 y.o. male admitted on 01/06/2023 with pneumonia.  Pharmacy has been consulted for cefepime and vancomycin dosing. Of note, pt was admitted for MRSA PNA in May 2024. Received inpatient abx and was discharged on doxycycline. Unfortunately, the MRSA susceptibilities revealed resistance to doxycycline. Pt re-presented with persistent symptoms and then took linezolid 600mg  PO BID x7 days (6/5-6/11/24).   6/30 AM: Vanc random 17 mcg/mL @1005  Vancomycin 1g IV x1    7/1 AM: Vanc random 28 mcg/mL @0400  - no vanc Scr 4.19  7/2 AM:  Vanc random 17 mcg/mL @0135  Scr 4.43 -pt specific kinetics using 2 levels: ke=0.022; 1/2 life =31 hours  Plan: Vancomycin 750mg  x1  Vancomycin levels PRN per protocol Continue Cefepime 2g IV q24h Monitor CBC, clinical picture, renal function, and cultures   Height: 5\' 5"  (165.1 cm) Weight: 71.7 kg (158 lb) IBW/kg (Calculated) : 61.5  Temp (24hrs), Avg:98.3 F (36.8 C), Min:97 F (36.1 C), Max:99 F (37.2 C)  Recent Labs  Lab 01/06/23 1540 01/06/23 1758 01/06/23 2234 01/06/23 2303 01/07/23 0145 01/07/23 1005 01/08/23 0407 01/08/23 1148 01/08/23 1622 01/09/23 0135  WBC  --   --   --   --  15.0*  --  14.9* 14.9* 17.2* 17.6*  CREATININE  --   --  3.90*  --  3.94*  --  4.19*  --  4.26* 4.43*  LATICACIDVEN 2.7* 3.7*  --  2.8* 1.3  --   --   --   --   --   VANCORANDOM  --   --   --   --   --    < > 28  --   --  17   < > = values in this interval not displayed.     Estimated Creatinine Clearance: 11.8 mL/min (A) (by C-G formula based on SCr of 4.43 mg/dL (H)).    Allergies  Allergen Reactions   Penicillins Rash    No problems with ampicillin during hospitalization    Antimicrobials this admission: Azithromycin 6/29 x1 dose Cefepime 6/29 > Vancomycin 6/29 >  Microbiology results: 7/1 MRSA neg 7/1 resp cx-  6/29- Ucx-  6/29- Bcx-  5/30- MRSA pos  Thank you for allowing  pharmacy to be a part of this patient's care.  Calton Dach, PharmD Clinical Pharmacist 01/09/2023 9:57 AM

## 2023-01-09 NOTE — Progress Notes (Signed)
Nutrition Follow-up  DOCUMENTATION CODES:  Non-severe (moderate) malnutrition in context of acute illness/injury  INTERVENTION:  Osmolite 1.5 goal rate of 43ml/hr (1200 ml per day) Start at 20 mL and hold at a trickle. Once ok per CCM, advance by 10mL q8h to goal Prosource TF20 60ml once daily Goal tube feeding regimen provides 1880 kcal, 95 gm protein, 914 ml free water daily Monitor magnesium and phosphorus every 12 hours x 4 occurrences, MD to replete as needed, as pt is at risk for refeeding syndrome given malnutrition. 100mg  of thiamine x 7 days   NUTRITION DIAGNOSIS:  Moderate Malnutrition related to acute illness (n/v, recurrent aspiration PNA) as evidenced by mild fat depletion, moderate muscle depletion, mild muscle depletion. - remains applicable  GOAL:  Patient will meet greater than or equal to 90% of their needs - not progressing, no nutrition support infusing at this time  MONITOR:  Labs, Weight trends, Diet advancement, TF tolerance  REASON FOR ASSESSMENT:  Ventilator Assessment of nutrition requirement/status  ASSESSMENT:  77 y.o. male admits related to nausea and vomiting, and cough. PMH includes: T2DM, HTN, COPD. Pt is currently receiving medical management related to pneumonia.  6/30 - s/p MBS- findings of esophageal dysphagia; recommend regular diet, thin liquids 7/1 - diet downgraded to NPO; Cortrak placed (tip in distal stomach); decline in respiratory status moved to ICU and intubated   Patient is currently intubated on ventilator support. Pt assessed yesterday prior to move to ICU and family relayed that pt has had poor PO intake and decline over the last few months as he has had several recurrent hospitalizations. Cortrak tube placed yesterday prior to transfer.   Discussed with GI and attending, ok to initiate enteral feeds. CCM requests trickles. Will monitor for ability to begin advancing to goal. Pt is at risk of refeeding due to his malnutrition and  poor PO intake. Will monitor labs and add thiamine x 7 days.   Pt requiring low dose pressor support and propofol being used for sedation.  MV: 11.8 L/min Temp (24hrs), Avg:98.2 F (36.8 C), Min:97 F (36.1 C), Max:98.9 F (37.2 C)  Propofol: 8.6 ml/hr (227 kcal/d)   Intake/Output Summary (Last 24 hours) at 01/09/2023 1059 Last data filed at 01/09/2023 0800 Gross per 24 hour  Intake 1531.01 ml  Output 750 ml  Net 781.01 ml  Net IO Since Admission: 2,433.66 mL [01/09/23 1059]  Nutritionally Relevant Medications: Scheduled Meds:  docusate  100 mg BID   insulin aspart  0-9 Units TID WC   REGLAN  10 mg Q6H   pantoprazole IV  40 mg Q12H   polyethylene glycol  17 g Daily   pramipexole  0.5 mg QHS   pravastatin  20 mg QPM   saccharomyces boulardii  250 mg BID   sodium bicarbonate      vancomycin    See admin instructions   Continuous Infusions:  ceFEPime (MAXIPIME) IV Stopped (01/08/23 1519)   famotidine (PEPCID) IV Stopped (01/08/23 1805)   norepinephrine (LEVOPHED) Adult infusion 4 mcg/min (01/09/23 0500)   propofol (DIPRIVAN) infusion 20 mcg/kg/min (01/09/23 0500)   Labs Reviewed: Na 130, chloride 94 BUN 68, creatinine 4.43 CBG ranges from 75-173 mg/dL over the last 24 hours  NUTRITION - FOCUSED PHYSICAL EXAM: Flowsheet Row Most Recent Value  Orbital Region Mild depletion  Upper Arm Region Moderate depletion  Thoracic and Lumbar Region No depletion  Buccal Region Mild depletion  Temple Region No depletion  Clavicle Bone Region Mild depletion  Clavicle and  Acromion Bone Region Moderate depletion  Scapular Bone Region Moderate depletion  Dorsal Hand No depletion  Patellar Region Moderate depletion  Anterior Thigh Region Moderate depletion  Posterior Calf Region No depletion  Edema (RD Assessment) None  Hair Reviewed  Eyes Reviewed  Mouth Reviewed  Skin Reviewed  Nails Reviewed   Diet Order:   Diet Order             Diet NPO time specified  Diet effective  now                   EDUCATION NEEDS:  No education needs have been identified at this time  Skin:  Skin Assessment: Reviewed RN Assessment  Last BM:  6/27 per pt sister  Height:  Ht Readings from Last 1 Encounters:  01/06/23 5\' 5"  (1.651 m)    Weight:  Wt Readings from Last 1 Encounters:  01/06/23 71.7 kg    Ideal Body Weight:  61.8 kg  BMI:  Body mass index is 26.29 kg/m.  Estimated Nutritional Needs:  Kcal:  1800-2000 Protein:  95-110g Fluid:  >/=1.8L    Greig Castilla, RD, LDN Clinical Dietitian RD pager # available in [facility]  After hours/weekend pager # available in [facility]

## 2023-01-09 NOTE — Progress Notes (Cosign Needed Addendum)
Daily Progress Note  DOA: 01/06/2023 Hospital Day: 4 Chief Complaint: anemia and esophageal dysmotility   Assessment and Plan:    77 yo male with recurrent PNA ( probably aspiration) with complaints of nausea, vomiting, ? dysphagia  Confusing picture. Denies dysphagia, endorses more N/V. However, MBSS concerning for esophageal dysmotility.  Keep N/V in mind in setting of NSAID use -Esophagram was ordered yesterday but not done due to worsening respiratory failure requiring intubation.  -Consider outpatient barium swallow and esophageal manometry to evaluation for esophageal motility though treatment for that is limited. Gastrostomy tube could be placed but that will not eliminate the risk for aspiration.  -If truly having nausea / vomiting then can consider EGD to evaluate for PUD or other etiologies of N/V but this will need to be after resolution of respiratory failure.  -GI will sign off for now.     Acute respiratory failure requiring intubation last evening  GERD / History of esophageal stenosis in 2020 but no esophageal findings on 2022 EGD (done for dysphagia) -continue BID IV PPI   Acute on chronic anemia. No overt GI blood loss  - He is up to date on polyp surveillance colonoscopy ( 2022). The exam was complete with a good prep.    - Hgb improved from 6.4 to 7.8 after one unit of blood 7/1    - Continue BID PPI  - Has Cortrak, enteral feedings to start today  AKI.  Cr normal on 6/4, now at 4.26. Not much urine output.    Subjective / New Events:   No family in room today.   Objective:   Recent Labs    01/08/23 1148 01/08/23 1622 01/08/23 1702 01/08/23 2132 01/09/23 0009 01/09/23 0135  WBC 14.9* 17.2*  --   --   --  17.6*  HGB 7.9* 10.0*   < > 9.9* 8.8* 7.8*  HCT 24.0* 29.6*   < > 29.0* 26.0* 23.3*  PLT 557* 611*  --   --   --  474*   < > = values in this interval not displayed.   BMET Recent Labs    01/08/23 0407 01/08/23 1622 01/08/23 1702  01/08/23 2132 01/09/23 0009 01/09/23 0135  NA 129* 126*   < > 127* 128* 130*  K 3.8 3.9   < > 4.0 4.1 3.8  CL 95* 96*  --   --   --  94*  CO2 19* 17*  --   --   --  19*  GLUCOSE 70 156*  --   --   --  180*  BUN 65* 66*  --   --   --  68*  CREATININE 4.19* 4.26*  --   --   --  4.43*  CALCIUM 7.8* 7.8*  --   --   --  7.4*   < > = values in this interval not displayed.   LFT Recent Labs    01/08/23 1622  PROT 6.0*  ALBUMIN 1.5*  AST 35  ALT 15  ALKPHOS 87  BILITOT 0.5   PT/INR Recent Labs    01/06/23 1540  LABPROT 15.5*  INR 1.2     Imaging:  Portable Chest x-ray CLINICAL DATA:  ETT placement  EXAM: PORTABLE CHEST 1 VIEW  COMPARISON:  01/08/2023 at 1710 hours  FINDINGS: Endotracheal tube terminates 3.5 cm above the carina.  Enteric tube courses into the stomach.  Multifocal patchy opacities in the lungs bilaterally, unchanged, suggesting multifocal pneumonia. No pleural  effusion or pneumothorax.  The heart is normal in size.  IMPRESSION: Endotracheal tube terminates 3.5 cm above the carina. Additional support apparatus as above.  Multifocal patchy opacities in the lungs bilaterally, suggesting multifocal pneumonia.  Electronically Signed   By: Charline Bills M.D.   On: 01/08/2023 20:43 DG CHEST PORT 1 VIEW CLINICAL DATA:  Respiratory failure.  EXAM: PORTABLE CHEST 1 VIEW  COMPARISON:  Chest radiograph dated 01/08/2023.  FINDINGS: Feeding tube extends below the diaphragm with tip beyond the margin of the image.  Interval worsening of bilateral pulmonary opacities compared to the earlier radiograph. No large pleural effusion. No pneumothorax. Stable cardiac silhouette no acute osseous pathology.  IMPRESSION: Interval worsening of bilateral pulmonary opacities.  Electronically Signed   By: Elgie Collard M.D.   On: 01/08/2023 17:45 DG Abd Portable 1V CLINICAL DATA:  Feeding tube  EXAM: PORTABLE ABDOMEN - 1 VIEW  COMPARISON:   12/30/2022  FINDINGS: Enteric tube tip overlies distal stomach. Small amount of residual contrast in the right upper quadrant bowel. Widespread bilateral airspace disease with small pleural effusions.  IMPRESSION: Enteric tube tip overlies distal stomach.  Electronically Signed   By: Jasmine Pang M.D.   On: 01/08/2023 15:08 DG CHEST PORT 1 VIEW CLINICAL DATA:  Shortness of breath.  Pneumonia.  EXAM: PORTABLE CHEST 1 VIEW  COMPARISON:  01/07/2023  FINDINGS: Stable cardiomediastinal contours. Suspect trace bilateral pleural effusions with blunting of the costophrenic angles. There is unchanged extensive airspace disease throughout the right lung which is most advanced in the right upper lobe. Similar mild left midlung and left lower lobe interstitial and airspace opacities. Osseous structures appear grossly intact.  IMPRESSION: 1. No significant interval change. 2. Extensive right lung airspace disease and mild left midlung and left lower lobe interstitial and airspace opacities. 3. Suspect trace bilateral pleural effusions.  Electronically Signed   By: Signa Kell M.D.   On: 01/08/2023 08:37     Scheduled inpatient medications:   arformoterol  15 mcg Nebulization BID   aspirin  81 mg Per Tube Daily   budesonide (PULMICORT) nebulizer solution  0.5 mg Nebulization BID   Chlorhexidine Gluconate Cloth  6 each Topical Daily   docusate  100 mg Per Tube BID   [START ON 01/10/2023] famotidine  10 mg Per Tube Daily   feeding supplement (PROSource TF20)  60 mL Per Tube Daily   fluticasone  2 spray Each Nare Daily   guaiFENesin  15 mL Per Tube Q4H   hydrocortisone sod succinate (SOLU-CORTEF) inj  100 mg Intravenous Q12H   insulin aspart  0-9 Units Subcutaneous TID WC   loratadine  10 mg Per Tube Daily   metoCLOPramide (REGLAN) injection  5 mg Intravenous Q8H   montelukast  10 mg Per Tube QHS   pantoprazole (PROTONIX) IV  40 mg Intravenous Q12H   polyethylene glycol  17  g Per Tube BID   pramipexole  0.5 mg Oral QHS   pravastatin  20 mg Per Tube QPM   revefenacin  175 mcg Nebulization Daily   saccharomyces boulardii  250 mg Per Tube BID   senna  1 tablet Per Tube QHS   sodium chloride HYPERTONIC  4 mL Nebulization BID   thiamine  100 mg Per Tube Daily   vancomycin variable dose per unstable renal function (pharmacist dosing)   Does not apply See admin instructions   Continuous inpatient infusions:   sodium chloride 10 mL/hr at 01/09/23 0800   ceFEPime (MAXIPIME) IV Stopped (01/08/23  1519)   feeding supplement (OSMOLITE 1.5 CAL)     fentaNYL infusion INTRAVENOUS 50 mcg/hr (01/09/23 0800)   norepinephrine (LEVOPHED) Adult infusion 6 mcg/min (01/09/23 1040)   propofol (DIPRIVAN) infusion 15 mcg/kg/min (01/09/23 0852)   PRN inpatient medications: acetaminophen, albuterol, ALPRAZolam, fentaNYL, gabapentin  Vital signs in last 24 hours: Temp:  [97 F (36.1 C)-98.9 F (37.2 C)] 97.4 F (36.3 C) (07/02 1113) Pulse Rate:  [60-117] 60 (07/02 0900) Resp:  [0-34] 24 (07/02 0900) BP: (65-163)/(46-108) 95/60 (07/02 0900) SpO2:  [76 %-100 %] 100 % (07/02 1206) FiO2 (%):  [70 %-100 %] 70 % (07/02 1206)    Intake/Output Summary (Last 24 hours) at 01/09/2023 1351 Last data filed at 01/09/2023 0800 Gross per 24 hour  Intake 1281.01 ml  Output 750 ml  Net 531.01 ml    Intake/Output from previous day: 07/01 0701 - 07/02 0700 In: 1492.5 [I.V.:481; Blood:356; NG/GT:250; IV Piggyback:405.5] Out: 750 [Urine:750] Intake/Output this shift: Total I/O In: 38.5 [I.V.:38.5] Out: -    Physical Exam:  General: pale male lying in bed in NAD. Sedated and intubated Heart:  Regular rate and rhythm.  Pulmonary: crackles / wheezing in chest.  Abdomen: Soft, nondistended, nontender. Normal bowel sounds.  Principal Problem:   Pneumonia Active Problems:   Acute exacerbation of COPD with asthma (HCC)   Acute respiratory failure with hypoxia (HCC)   Aspiration pneumonia  (HCC)   Malnutrition of moderate degree   Abnormal barium swallow   History of esophageal stricture   Recurrent pneumonia   Other dysphagia     LOS: 3 days   Willette Cluster ,NP 01/09/2023, 1:51 PM

## 2023-01-09 NOTE — Progress Notes (Signed)
Pharmacy ICU Bowel Regimen Consult Note   Current Inpatient Medications for Bowel Management:  Miralax daily  Assessment: Travis Beck is a 77 y.o. year old male admitted on 01/06/2023. Constipation identified as non-opioid-induced constipation . Bowel regimen assessment completed by Lissa Hoard, RN on 7/2. LBM on prior to admission.  []  Bowel sounds present  [x]  No abdominal tenderness  []  Passing gas   Plan: Increase miralax to BID  Start senna QHS MD contacted (if needed): [place]  Thank you for allowing pharmacy to participate in this patient's care.  Calton Dach, PharmD Clinical Pharmacist 01/09/2023 9:49 AM

## 2023-01-09 NOTE — Progress Notes (Addendum)
NAME:  Travis Beck, MRN:  [id number], DOB:  04-14-1946, LOS: 3 ADMISSION DATE:  01/06/2023, CONSULTATION DATE:  01/08/2023 REFERRING MD:  Marland Mcalpine, CHIEF COMPLAINT:  Aspiration Pneumonia, nausea and vomiting   History of Present Illness:  77 y.o. male with medical history significant of IIDM, HTN, SIDAH, COPD, ( Former smoker quit 1994 with a 66 pack year smoking history diabetic neuropathy, recurrent pneumonia presented 01/06/2023 with worsening of nauseous vomiting cough and shortness of breath.  Patient was recently hospitalized 5/23-5/26 for multifocal MRSA pneumonia  and septic shock requiring vasopressor on admission-stabilized and subsequently discharged home on Augmentin/doxycycline . Pt returned to [facility] 12/07/2022 with fever of 103.1 and ongoing left sided pleuritic pain. He has a small effusion that was not big enough to tap.  He was admitted for incompletely treated PNA-as sputum cultures finalized postdischarge, and Doxycycline did not cover MRSA. He was treated with Zyvox, and discharged home on Zyvox p.o on 12/13/2022. Initially his breathing symptoms improved with Zyvox however patient continued to experience frequent nausea, vomiting and cough. He presented to the ED 6/29 as these symptoms were worsening.  In the ED he was mildly febrile at 99.6, He had  tachycardia, oxygen sats were 93% on 3 L. CXR again showed a multifocal pneumonia, right sided. Na 128/ Creatinine of 4.2, BUN 66 and WBC of 20. His HGB was 8.7.Respiratory Panel was negative. Lactic Acid trend since admission 2.7>>2.8>>1,3 Pulmonary was  asked to consult for recurrent vs slow to resolve pneumonia and hypoxemia . Of note , patient is followed in the [facility]  Pulmonary clinic by Dr. Isaiah Serge and Rubye Oaks NP.  Per family patient has had about a 15 pound weight loss since the end of May 24 when pneumonia was initially diagnosed.  Pertinent  Medical History   Past Medical History:  Diagnosis Date    Arthritis    Asthma    Childhood   Emphysema lung (HCC)    Essential hypertension    Ischemic heart disease    Myoview 2020 indicating inferior infarct scar with mild peri-infarct ischemia - managed medically   Type 2 diabetes mellitus (HCC)      Significant Hospital Events: Including procedures, antibiotic start and stop dates in addition to other pertinent events         5/23-5/26>> hospitalization for septic shock from multilobar PNA. 5/30>>6/5>> fever 103 F at home-ongoing left-sided pleuritic chest pain- 6/29>> Recurrent multifocal  ? Aspiration  pneumonia  Interim History / Subjective:   Overnight, patient developed worsening shortness of breath, and was subsequently intubated.  On exam this morning, patient intubated and sedated.  Patient requiring fentanyl, norepinephrine, and propofol.  Patient sister at bedside.  Stating patient has been in and out of the hospital multiple times.  Patient had never got back to 100% per sister at bedside.   CXR 01/08/2023>> shows extensive right lung / and mild left mid lung / left lower lobe interstitial airspace opacities>> [facility] wrote for some lasix  01/06/2023>>Swallow evaluation >> esophageal dysphagia, needs GI consult for esophageal dysphagia management. Family state patient eats very fast, and he dose not complain of cough or  getting choked.   Objective   Blood pressure (!) 96/56, pulse 65, temperature (!) 97.2 F (36.2 C), temperature source Axillary, resp. rate 20, height 5\' 5"  (1.651 m), weight 71.7 kg, SpO2 100 %.  Patient remains afebrile with temperature 98.9 F.  Maps (67-76) on 4 of Levophed, respiration rate 21-24 on ventilator settings of  PRVC, rate 24, PEEP 8, FiO2 80.  Pulse 69-76    Vent Mode: PRVC FiO2 (%):  [80 %-100 %] 80 % Set Rate:  [20 bmp-24 bmp] 24 bmp Vt Set:  [490 mL] 490 mL PEEP:  [5 cmH20-8 cmH20] 8 cmH20 Plateau Pressure:  [23 cmH20-28 cmH20] 23 cmH20   Intake/Output Summary (Last 24 hours) at 01/09/2023  0830 Last data filed at 01/09/2023 0500 Gross per 24 hour  Intake 1415.75 ml  Output 750 ml  Net 665.75 ml   Filed Weights   01/06/23 1322  Weight: 71.7 kg    Examination: General: Critically ill, intubate and sedated, able to wake up stimulation  HENT:  Intubated, Atraumatic, normocephalic  Lungs:  Coarse breath sounds throughout, bilaterally ventilated lung sounds  Cardiovascular: Regular rate and rhythm   Abdomen:  Soft , Flat , BS +, Body mass index is 26.29 kg/m.  Extremities:  No obvious abnormalities, loss of tissue mass Neuro:  Intubated and sedated  GU:  No urine output overnight   T max 98.8 Net + 1650 since admission   Labs reviewed 7/2 Blood gas pH 7.21/52.9/170 BMP: Sodium 130 (128), K3.8 (4.1), bicarb 19, creatinine 4.43 (4.26) CBC: White count 17.6 (17.2) hemoglobin 7.8 (8.8), platelet 474 (611) Magnesium 2.1 Triglyceride 115 Glucose 103-173  Respiratory culture: Rare budding yeast Urine culture: No growth Blood culture: No growth 2 days  Chest x-ray: Multi focal patchy opacities in lungs bilaterally suggesting multifocal pneumonia  Resolved Hospital Problem list     Assessment & Plan:   #Sepsis #Acute hypoxic respiratory failure requiring intubation #Multifocal pneumonia, recurrent aspiration pneumonia #COPD #Leukocytosis Patient evaluated bedside this morning.  Blood cultures with no growth to date.  Urine culture no growth.  Respiratory culture is currently negative, likely contaminant. -Continue Brovana, Pulmicort, Atrovent -Continue Xopenex/albuterol as needed -Aggressive pulmonary toilet with IS and flutter valve -Vancomycin and cefepime day 3/7 -Currently sedated with fentanyl and propofol -Monitor white counts  -Monitor fever curve  -Follow cultures  -Goal O2 88/92% -Stress dose steroids   #Septic shock 2/2 above Overnight, patient has been intubated and sedated.  Patient did have decreased blood pressures with MAPs below 65.   Levophed was initiated.  Will continue with antibiotics and titrate Levophed to keep MAP. Did receive IV albumin yesterday.  -Titrate to keep MAP greater than 65 -Currently on 4 Levophed, if needs more Levophed, likely will need central line.  #Iron deficiency anemia Patient is transfused 1 unit in hospitalization.  Hemoglobin this morning 7.8.  Will continue to monitor for any further bleeding.  GI following.  Avoiding scope until patient is more stable.  Iron studies showing ferritin 186, iron less than 5, TIBC 136.  Concern for iron deficiency anemia, likely will not replete at this time given active infection. -Occult test pending -GI following, appreciate recs -Transfuse to keep hemoglobin greater than 7  #AKI Baseline creatinine around 0.7.  Creatinine this morning is 4.43.  Likely related to blood loss as well as prerenal in the setting of septic shock.  Patient did have 750 mL of output yesterday, but none overnight.  Will continue to monitor. -Monitor BMP -Monitor urine output -Replete electrolytes as needed -Bladder scan   #Esophageal Dysmotility ( per swallow eval 6/30) possibly contributing to slow to resolve pneumonia Plan Patient intubated and sedated at this point.  GI following for possible scope once patient is more stable.    #Weight loss >> 15 pounds in the last month Plan Protein supplements  Consider dietitian consult  Best Practice (right click and "Reselect all SmartList Selections" daily)   Diet/type: NPO, trickle feeds  DVT prophylaxis: SCD GI prophylaxis: Famotidine Lines: 3 peripheral IVs, external urinary catheter, OG tube Foley:  N/A Code Status:  full code Last date of multidisciplinary goals of care discussion  Labs   CBC: Recent Labs  Lab 01/06/23 1332 01/07/23 0145 01/07/23 0414 01/08/23 0407 01/08/23 1148 01/08/23 1622 01/08/23 1702 01/08/23 2132 01/09/23 0009 01/09/23 0135  WBC 20.2* 15.0*  --  14.9* 14.9* 17.2*  --   --   --  17.6*   NEUTROABS 18.1*  --   --  11.6*  --  14.4*  --   --   --   --   HGB 8.7* 6.8*   < > 6.4* 7.9* 10.0* 9.9* 9.9* 8.8* 7.8*  HCT 26.6* 20.9*   < > 19.8* 24.0* 29.6* 29.0* 29.0* 26.0* 23.3*  MCV 88.1 87.4  --  86.8 87.6 83.1  --   --   --  85.7  PLT 587* 427*  --  649* 557* 611*  --   --   --  474*   < > = values in this interval not displayed.    Basic Metabolic Panel: Recent Labs  Lab 01/06/23 2234 01/07/23 0145 01/08/23 0407 01/08/23 1622 01/08/23 1702 01/08/23 2132 01/09/23 0009 01/09/23 0135  NA 128* 126* 129* 126* 126* 127* 128* 130*  K 3.8 3.8 3.8 3.9 4.0 4.0 4.1 3.8  CL 94* 98 95* 96*  --   --   --  94*  CO2 18* 17* 19* 17*  --   --   --  19*  GLUCOSE 130* 76 70 156*  --   --   --  180*  BUN 65* 63* 65* 66*  --   --   --  68*  CREATININE 3.90* 3.94* 4.19* 4.26*  --   --   --  4.43*  CALCIUM 7.7* 7.1* 7.8* 7.8*  --   --   --  7.4*  MG  --   --  1.7 2.3  --   --   --  2.1  PHOS  --   --  4.0 5.2*  --   --   --   --    GFR: Estimated Creatinine Clearance: 11.8 mL/min (A) (by C-G formula based on SCr of 4.43 mg/dL (H)). Recent Labs  Lab 01/06/23 1540 01/06/23 1758 01/06/23 2303 01/07/23 0145 01/08/23 0407 01/08/23 1148 01/08/23 1622 01/09/23 0135  WBC  --   --   --  15.0* 14.9* 14.9* 17.2* 17.6*  LATICACIDVEN 2.7* 3.7* 2.8* 1.3  --   --   --   --     Liver Function Tests: Recent Labs  Lab 01/08/23 0407 01/08/23 1622  AST 43* 35  ALT 16 15  ALKPHOS 79 87  BILITOT 0.4 0.5  PROT 6.0* 6.0*  ALBUMIN <1.5* 1.5*   No results for input(s): "LIPASE", "AMYLASE" in the last 168 hours. No results for input(s): "AMMONIA" in the last 168 hours.  ABG    Component Value Date/Time   PHART 7.213 (L) 01/09/2023 0009   PCO2ART 52.9 (H) 01/09/2023 0009   PO2ART 170 (H) 01/09/2023 0009   HCO3 21.3 01/09/2023 0009   TCO2 23 01/09/2023 0009   ACIDBASEDEF 6.0 (H) 01/09/2023 0009   O2SAT 99 01/09/2023 0009     Coagulation Profile: Recent Labs  Lab 01/06/23 1540   INR 1.2  Cardiac Enzymes: No results for input(s): "CKTOTAL", "CKMB", "CKMBINDEX", "TROPONINI" in the last 168 hours.  HbA1C: Hgb A1c MFr Bld  Date/Time Value Ref Range Status  12/01/2022 05:41 AM 6.8 (H) 4.8 - 5.6 % Final    Comment:    (NOTE)         Prediabetes: 5.7 - 6.4         Diabetes: >6.4         Glycemic control for adults with diabetes: <7.0     CBG: Recent Labs  Lab 01/08/23 1641 01/08/23 1912 01/08/23 2314 01/09/23 0314 01/09/23 0729  GLUCAP 153* 103* 146* 173* 157*    Review of Systems:   Shortness of breath with exertion and nausea, which is better than when admitted  Past Medical History:  He,  has a past medical history of Arthritis, Asthma, Emphysema lung (HCC), Essential hypertension, Ischemic heart disease, and Type 2 diabetes mellitus (HCC).   Surgical History:   Past Surgical History:  Procedure Laterality Date   BIOPSY  08/30/2018   Procedure: BIOPSY;  Surgeon: Malissa Hippo, MD;  [facility];  Service: Endoscopy;;  gastric    BIOPSY  11/05/2020   Procedure: BIOPSY;  Surgeon: Dolores Frame, MD;  [facility];  Service: Gastroenterology;;   COLONOSCOPY     COLONOSCOPY WITH PROPOFOL N/A 11/05/2020   Procedure: COLONOSCOPY WITH PROPOFOL;  Surgeon: Dolores Frame, MD;  [facility];  Service: Gastroenterology;  Laterality: N/A;  Am   ESOPHAGEAL DILATION N/A 05/21/2019   Procedure: ESOPHAGEAL DILATION;  Surgeon: Malissa Hippo, MD;  [facility];  Service: Endoscopy;  Laterality: N/A;   ESOPHAGOGASTRODUODENOSCOPY N/A 05/21/2019   Procedure: ESOPHAGOGASTRODUODENOSCOPY (EGD);  Surgeon: Malissa Hippo, MD;  [facility];  Service: Endoscopy;  Laterality: N/A;  730   ESOPHAGOGASTRODUODENOSCOPY (EGD) WITH PROPOFOL N/A 08/30/2018   Procedure: ESOPHAGOGASTRODUODENOSCOPY (EGD) WITH PROPOFOL;  Surgeon: Malissa Hippo, MD;  [facility];  Service: Endoscopy;   Laterality: N/A;  1:30   ESOPHAGOGASTRODUODENOSCOPY (EGD) WITH PROPOFOL N/A 11/05/2020   Procedure: ESOPHAGOGASTRODUODENOSCOPY (EGD) WITH PROPOFOL;  Surgeon: Dolores Frame, MD;  [facility];  Service: Gastroenterology;  Laterality: N/A;   HERNIA REPAIR     bilateral lower abdomen   POLYPECTOMY  11/05/2020   Procedure: POLYPECTOMY;  Surgeon: Dolores Frame, MD;  [facility];  Service: Gastroenterology;;   Gaspar Bidding DILATION  11/05/2020   Procedure: Gaspar Bidding DILATION;  Surgeon: Marguerita Merles, Reuel Boom, MD;  [facility]: [facility];  Service: Gastroenterology;;     Social History:   reports that he quit smoking about 29 years ago. His smoking use included cigarettes. He has a 66.00 pack-year smoking history. He has never used smokeless tobacco. He reports that he does not currently use alcohol. He reports that he does not use drugs.   Family History:  His family history is not on file.   Allergies Allergies  Allergen Reactions   Penicillins Rash    No problems with ampicillin during hospitalization     Home Medications  Prior to Admission medications   Medication Sig Start Date End Date Taking? Authorizing Provider  acetaminophen (TYLENOL) 325 MG tablet Take 2 tablets (650 mg total) by mouth every 6 (six) hours as needed for mild pain (or Fever >/= 101). 11/26/19  Yes Emokpae, Courage, MD  albuterol (VENTOLIN HFA) 108 (90 Base) MCG/ACT inhaler Inhale 2 puffs into the lungs every 4 (four) hours as  needed for wheezing or shortness of breath. Patient taking differently: Inhale 1 puff into the lungs every 4 (four) hours as needed for wheezing or shortness of breath. 11/26/19  Yes Emokpae, Courage, MD  ALPRAZolam Prudy Feeler) 1 MG tablet Take 1 tablet (1 mg total) by mouth 3 (three) times daily as needed for anxiety. Patient taking differently: Take 1-2 mg by mouth at bedtime as needed for anxiety or sleep. 01/28/19  Yes Welborn, Ryan, DO  amLODipine (NORVASC)  10 MG tablet Take 10 mg by mouth at bedtime.   Yes [provider]  Budeson-Glycopyrrol-Formoterol (BREZTRI AEROSPHERE) 160-9-4.8 MCG/ACT AERO Inhale 2 puffs into the lungs in the morning and at bedtime. 12/13/22  Yes Leroy Sea, MD  buprenorphine (SUBUTEX) 8 MG SUBL SL tablet Place 4 mg under the tongue as needed (pain). 01/04/19  Yes [provider]  cetirizine (ZYRTEC) 10 MG tablet Take 10 mg by mouth at bedtime.   Yes [provider]  famotidine (PEPCID) 20 MG tablet Take 1 tablet (20 mg total) by mouth 2 (two) times daily for 7 days. Take one tablet twice daily for two days Patient taking differently: Take 20 mg by mouth as needed for heartburn or indigestion. 12/30/22 01/06/23 Yes Gerhard Munch, MD  fluticasone [facility]) 50 MCG/ACT nasal spray Place 1 spray into both nostrils daily. 08/22/18  Yes [provider]  gabapentin (NEURONTIN) 400 MG capsule Take 1 capsule (400 mg total) by mouth every 8 (eight) hours as needed. Patient taking differently: Take 400-800 mg by mouth as needed (pain). 01/28/19  Yes Welborn, Ryan, DO  glipiZIDE (GLUCOTROL XL) 10 MG 24 hr tablet Take 10 mg by mouth daily. 11/06/22  Yes [provider]  guaiFENesin (MUCINEX) 600 MG 12 hr tablet Take 1 tablet (600 mg total) by mouth 2 (two) times daily as needed for cough. Patient taking differently: Take 600 mg by mouth 2 (two) times daily as needed for cough or to loosen phlegm. 12/13/22  Yes Leroy Sea, MD  metFORMIN (GLUCOPHAGE) 1000 MG tablet Take 1,000 mg by mouth 2 (two) times daily with a meal.  12/07/14  Yes [provider]  montelukast (SINGULAIR) 10 MG tablet Take 1 tablet (10 mg total) by mouth at bedtime. 06/15/22  Yes Mannam, Praveen, MD  ondansetron (ZOFRAN-ODT) 4 MG disintegrating tablet Take 1 tablet (4 mg total) by mouth every 8 (eight) hours as needed for nausea or vomiting. Consider taking 30 minutes prior to meals. Patient taking differently: Take  4 mg by mouth as needed for nausea or vomiting. 12/30/22  Yes Gerhard Munch, MD  pantoprazole (PROTONIX) 40 MG tablet Take 1 tablet (40 mg total) by mouth daily. 12/15/20  Yes Dolores Frame, MD  pramipexole (MIRAPEX) 0.5 MG tablet Take 0.5 mg by mouth at bedtime. 11/04/22  Yes [provider]  pravastatin (PRAVACHOL) 20 MG tablet Take 20 mg by mouth every evening.   Yes [provider]  sucralfate (CARAFATE) 1 g tablet Take 1 tablet (1 g total) by mouth 4 (four) times daily -  with meals and at bedtime. 12/30/22  Yes Gerhard Munch, MD  valsartan (DIOVAN) 160 MG tablet Take 160 mg by mouth daily. 11/06/22  Yes [provider]  aspirin EC 81 MG tablet Take 1 tablet (81 mg total) by mouth daily. Swallow whole. Patient not taking: Reported on 01/06/2023 09/30/20   Jonelle Sidle, MD  linezolid (ZYVOX) 600 MG tablet Take 1 tablet (600 mg total) by mouth every 12 (twelve)  hours. Patient not taking: Reported on 01/06/2023 12/13/22   Leroy Sea, MD        Modena Slater, DO  Internal Medicine Resident PGY-2 01/09/2023 8:30 AM

## 2023-01-10 DIAGNOSIS — J9601 Acute respiratory failure with hypoxia: Secondary | ICD-10-CM | POA: Diagnosis not present

## 2023-01-10 DIAGNOSIS — R1319 Other dysphagia: Secondary | ICD-10-CM | POA: Diagnosis not present

## 2023-01-10 DIAGNOSIS — R531 Weakness: Secondary | ICD-10-CM | POA: Diagnosis not present

## 2023-01-10 DIAGNOSIS — J690 Pneumonitis due to inhalation of food and vomit: Secondary | ICD-10-CM | POA: Diagnosis not present

## 2023-01-10 LAB — MAGNESIUM
Magnesium: 2.4 mg/dL (ref 1.7–2.4)
Magnesium: 2.4 mg/dL (ref 1.7–2.4)

## 2023-01-10 LAB — GLUCOSE, CAPILLARY
Glucose-Capillary: 226 mg/dL — ABNORMAL HIGH (ref 70–99)
Glucose-Capillary: 275 mg/dL — ABNORMAL HIGH (ref 70–99)
Glucose-Capillary: 234 mg/dL — ABNORMAL HIGH (ref 70–99)
Glucose-Capillary: 233 mg/dL — ABNORMAL HIGH (ref 70–99)
Glucose-Capillary: 195 mg/dL — ABNORMAL HIGH (ref 70–99)
Glucose-Capillary: 188 mg/dL — ABNORMAL HIGH (ref 70–99)

## 2023-01-10 LAB — CBC
HCT: 24.6 % — ABNORMAL LOW (ref 39.0–52.0)
Hemoglobin: 8.5 g/dL — ABNORMAL LOW (ref 13.0–17.0)
MCH: 29.2 pg (ref 26.0–34.0)
MCHC: 34.6 g/dL (ref 30.0–36.0)
MCV: 84.5 fL (ref 80.0–100.0)
Platelets: 531 10*3/uL — ABNORMAL HIGH (ref 150–400)
RBC: 2.91 MIL/uL — ABNORMAL LOW (ref 4.22–5.81)
RDW: 15 % (ref 11.5–15.5)
WBC: 10.6 10*3/uL — ABNORMAL HIGH (ref 4.0–10.5)
nRBC: 0 % (ref 0.0–0.2)

## 2023-01-10 LAB — BASIC METABOLIC PANEL
Anion gap: 18 — ABNORMAL HIGH (ref 5–15)
BUN: 84 mg/dL — ABNORMAL HIGH (ref 8–23)
CO2: 17 mmol/L — ABNORMAL LOW (ref 22–32)
Calcium: 7.5 mg/dL — ABNORMAL LOW (ref 8.9–10.3)
Chloride: 94 mmol/L — ABNORMAL LOW (ref 98–111)
Creatinine, Ser: 5.14 mg/dL — ABNORMAL HIGH (ref 0.61–1.24)
GFR, Estimated: 11 mL/min — ABNORMAL LOW (ref >60)
Glucose, Bld: 281 mg/dL — ABNORMAL HIGH (ref 70–99)
Potassium: 4.2 mmol/L (ref 3.5–5.1)
Sodium: 129 mmol/L — ABNORMAL LOW (ref 135–145)

## 2023-01-10 LAB — CULTURE, BLOOD (ROUTINE X 2)
Culture: NO GROWTH
Special Requests: ADEQUATE

## 2023-01-10 LAB — PHOSPHORUS
Phosphorus: 8 mg/dL — ABNORMAL HIGH (ref 2.5–4.6)
Phosphorus: 6.9 mg/dL — ABNORMAL HIGH (ref 2.5–4.6)

## 2023-01-10 LAB — BLOOD GAS, VENOUS
Acid-base deficit: 7.1 mmol/L — ABNORMAL HIGH (ref 0.0–2.0)
Bicarbonate: 17.9 mmol/L — ABNORMAL LOW (ref 20.0–28.0)
O2 Saturation: 93.2 %
Patient temperature: 36.8
pCO2, Ven: 34 mmHg — ABNORMAL LOW (ref 44–60)
pH, Ven: 7.33 (ref 7.25–7.43)
pO2, Ven: 62 mmHg — ABNORMAL HIGH (ref 32–45)

## 2023-01-10 LAB — VANCOMYCIN, RANDOM: Vancomycin Rm: 28 ug/mL

## 2023-01-10 LAB — CULTURE, RESPIRATORY W GRAM STAIN

## 2023-01-10 MED ORDER — LACTATED RINGERS IV SOLN: INTRAVENOUS | Status: DC

## 2023-01-10 MED ORDER — INSULIN ASPART 100 UNIT/ML IJ SOLN: 0.0000 [IU] | Freq: Every day | INTRAMUSCULAR | Status: DC

## 2023-01-10 MED ORDER — SODIUM CHLORIDE 0.9 % IV BOLUS
1000.0000 mL | Freq: Once | INTRAVENOUS | Status: AC
  Administered 2023-01-10: 1000 mL via INTRAVENOUS

## 2023-01-10 MED ORDER — HEPARIN SODIUM (PORCINE) 5000 UNIT/ML IJ SOLN
5000.0000 [IU] | Freq: Three times a day (TID) | INTRAMUSCULAR | Status: AC
  Administered 2023-01-10 – 2023-01-22 (×35): 5000 [IU] via SUBCUTANEOUS
  Filled 2023-01-10 (×35): qty 1

## 2023-01-10 MED ORDER — NOREPINEPHRINE 4 MG/250ML-% IV SOLN
2.0000 ug/min | INTRAVENOUS | Status: DC
  Administered 2023-01-11: 4 ug/min via INTRAVENOUS
  Filled 2023-01-10: qty 250

## 2023-01-10 MED ORDER — NOREPINEPHRINE 4 MG/250ML-% IV SOLN: 0.0000 ug/min | INTRAVENOUS | Status: DC

## 2023-01-10 MED ORDER — VANCOMYCIN VARIABLE DOSE PER UNSTABLE RENAL FUNCTION (PHARMACIST DOSING): Status: DC

## 2023-01-10 MED ORDER — BISACODYL 10 MG RE SUPP
10.0000 mg | Freq: Once | RECTAL | Status: AC
  Administered 2023-01-10: 10 mg via RECTAL
  Filled 2023-01-10: qty 1

## 2023-01-10 MED ORDER — ALPRAZOLAM 0.5 MG PO TABS
1.0000 mg | ORAL_TABLET | Freq: Three times a day (TID) | ORAL | Status: DC | PRN
  Administered 2023-01-10: 1 mg via ORAL
  Filled 2023-01-10: qty 2

## 2023-01-10 MED ORDER — INSULIN ASPART 100 UNIT/ML IJ SOLN
0.0000 [IU] | INTRAMUSCULAR | Status: DC
  Administered 2023-01-10: 2 [IU] via SUBCUTANEOUS
  Administered 2023-01-11: 1 [IU] via SUBCUTANEOUS
  Administered 2023-01-11: 2 [IU] via SUBCUTANEOUS
  Administered 2023-01-11: 1 [IU] via SUBCUTANEOUS
  Administered 2023-01-11: 2 [IU] via SUBCUTANEOUS
  Administered 2023-01-11: 1 [IU] via SUBCUTANEOUS
  Administered 2023-01-11 – 2023-01-12 (×5): 2 [IU] via SUBCUTANEOUS
  Administered 2023-01-12: 1 [IU] via SUBCUTANEOUS
  Administered 2023-01-12 – 2023-01-13 (×3): 2 [IU] via SUBCUTANEOUS
  Administered 2023-01-13 (×2): 3 [IU] via SUBCUTANEOUS
  Administered 2023-01-13: 1 [IU] via SUBCUTANEOUS
  Administered 2023-01-13: 3 [IU] via SUBCUTANEOUS
  Administered 2023-01-13: 1 [IU] via SUBCUTANEOUS
  Administered 2023-01-14: 5 [IU] via SUBCUTANEOUS
  Administered 2023-01-14: 3 [IU] via SUBCUTANEOUS
  Administered 2023-01-14 (×2): 5 [IU] via SUBCUTANEOUS
  Administered 2023-01-14: 6 [IU] via SUBCUTANEOUS
  Administered 2023-01-14 – 2023-01-15 (×3): 5 [IU] via SUBCUTANEOUS

## 2023-01-10 MED ORDER — SENNA 8.6 MG PO TABS
1.0000 | ORAL_TABLET | Freq: Two times a day (BID) | ORAL | Status: DC
  Administered 2023-01-10 – 2023-01-11 (×3): 8.6 mg
  Filled 2023-01-10 (×3): qty 1

## 2023-01-10 MED ORDER — INSULIN ASPART 100 UNIT/ML IJ SOLN
0.0000 [IU] | Freq: Three times a day (TID) | INTRAMUSCULAR | Status: DC
  Administered 2023-01-10 (×2): 7 [IU] via SUBCUTANEOUS
  Administered 2023-01-10: 11 [IU] via SUBCUTANEOUS

## 2023-01-10 MED ORDER — SODIUM CHLORIDE 0.9 % IV SOLN
250.0000 mL | INTRAVENOUS | Status: DC
  Administered 2023-01-22: 250 mL via INTRAVENOUS

## 2023-01-10 NOTE — Progress Notes (Signed)
Patient ID: Travis Beck, male   DOB: 08/23/1945, 77 y.o.   MRN: [id number]    Progress Note from the Palliative Medicine Team at [facility]   Patient Name: Travis Beck        Date: 01/10/2023 DOB: 1946/01/20  Age: 77 y.o. MRN#: [id number] Attending Physician: Tomma Lightning, MD Primary Care Physician: Kirstie Peri, MD Admit Date: 01/06/2023    Extensive chart review has been completed prior to meeting with patient/family  including labs, vital signs, imaging, progress/consult notes, orders, medications and available advance directive documents.   77 y.o. male   admitted on 01/06/2023 with past   medical history significant of IIDM, HTN, SIDAH, COPD,  former smoker quit 1994 with a 66 pack year smoking history, diabetic neuropathy, recurrent pneumonia presented 01/06/2023 with worsening nausea /, cough and shortness of breath.    Patient was recently hospitalized 5/23-5/26 for multifocal MRSA pneumonia  and septic shock requiring vasopressor on admission-stabilized and subsequently discharged home on Augmentin/doxycycline .    Pt [facility] to [facility] 12/07/2022 with fever of 103.1 and ongoing left sided pleuritic pain. He has a small effusion that was not big enough to tap.  He was admitted for incompletely treated PNA-as sputum cultures finalized postdischarge, and Doxycycline did not cover MRSA. He was treated with Zyvox, and discharged home on Zyvox p.o on 12/13/2022.    Initially his breathing symptoms improved with Zyvox however patient continued to experience frequent nausea, vomiting and cough. He presented to the ED 6/29 as these symptoms were worsening.   01/06/23 swallow evaluation significant for esophageal dysphagia  01/08/23 worsening chest x-ray, significant for extensive right lung, mild left mid lung and left lower lobe interstitial airspace opacities.  Required intubation  01/10/2023-excessively extubated  This NP assessed patient at the bedside as a  follow up to yesterday's PMT  meeting for palliative medicine needs and emotional support.  Patient is alert and oriented patient's sister/Marie and his niece Hansberger are at the bedside  Continued education regarding the seriousness of patient's current medical situation and is high risk for decompensation secondary to his multiple co-morbidities; dysphagia, COPD, recurrent aspiration pneumonia, weakness and overall failure to thrive.   Education offered on advanced directives.  Concepts specific to code status, artifical feeding and hydration, continued IV antibiotics and rehospitalization was had.   Education offered on thigh difference between a aggressive medical intervention path  and a palliative comfort care path for this patient at this time was had.    Values and goals of care important to patient and family were attempted to be elicited.         Patient was able to verbalize the importance of quality of life to his family.  "  I do not want to live hooked up to machines and I do not want to just lay here in the bed".   Today he remains a Full Code  Hope today is to secure HPOA with the assistance of spiritual care.  Patient wishes to name his sister [name] as HPOA   Education offered today regarding  the importance of continued conversation with family and their  medical providers regarding overall plan of care and treatment options,  ensuring decisions are within the context of the patients values and GOCs.   Patient and family will need ongoing support as they navigate healthcare decisions.  Questions and concerns addressed   Discussed with Dr Chestine Spore    Time: 55 minutes  Detailed review  of medical records ( labs, imaging, vital signs), medically appropriate exam ( MS, skin, resp)   discussed with treatment team, counseling and education to patient, family, staff, documenting clinical information, medication management, coordination of care    Lorinda Creed NP  Palliative  Medicine Team Team Phone # [phone] Pager [phone]

## 2023-01-10 NOTE — Procedures (Signed)
Extubation Procedure Note  Patient Details:   Name: Travis Beck DOB: 1945/10/29 MRN: [id number]   Airway Documentation:    Vent end date: 01/10/23 Vent end time: 0816   Evaluation  O2 sats: transiently fell during during procedure Complications: No apparent complications Patient did tolerate procedure well. Bilateral Breath Sounds: Clear, Diminished   Yes  Pt extubated per order to 10L HFNC (salter). Pt had a positive cuff leak, able to state name, strong cough and no stridor noted. RT will monitor as needed.   Kerri Perches 01/10/2023, 8:18 AM

## 2023-01-10 NOTE — TOC Progression Note (Addendum)
Transition of Care [facility]) - Progression Note    Patient Details  Name: Travis Beck MRN: [id number] Date of Birth: January 23, 1946  Transition of Care [facility]) CM/SW Contact  Janae Bridgeman, RN Phone Number: 01/10/2023, 2:08 PM  Clinical Narrative:    CM spoke with the attending physician and patient is LTAC candidate for rehabilitation services.  I met with the patient at the bedside to offer Medicare choice regarding LTAC and the patient/family chose [facility].  I called and left a message with Claudean Severance, CM with [facility] and she was notified to follow up regarding insurance verification and admission.  Bedside RN is aware as well.    Patient remains on 10 L/min [place] and Cortrak is in place through Left nare at this time.  01/10/23 1534 - The patients family - [name] and [name] reach out to me and would like a tour of both LTAC facilities before making a final decision for placement.  I called [facility] and [facility] and asked that they reach out to the family for a tour tomorrow before family decides.  MD is aware.  Patient should be medically stable to transfer to the [facility] of choice tomorrow 01/11/23.  Patient remains on High flow [place] at 10 L/min and attending MD would like to watch him overnight and recheck labs in the am before patient is able to discharge to [facility] of choice.  CMS Website will be provided as well to aide in choice for LTAC placement.  Patient's family [name], [name] plan to take a tour of [facility] and [facility] in the am and will make a decision of choice by tomorrow 12 noon - Patient should be medically stable to discharge tomorrow - TOC Team to follow up with attending MD tomorrow afternoon.  CM will continue to follow the patient for LTAC placement.    Expected Discharge Plan: Long Term Acute Care (LTAC) Barriers to Discharge: Continued Medical Work up  Expected Discharge Plan and Services   Discharge Planning  Services: CM Consult Post Acute Care Choice: Long Term Acute Care (LTAC) Living arrangements for the past 2 months: Single Family Home                                       Social Determinants of Health (SDOH) Interventions SDOH Screenings   Food Insecurity: No Food Insecurity (12/08/2022)  Housing: Patient Declined (12/08/2022)  Transportation Needs: No Transportation Needs (12/08/2022)  Utilities: Not At Risk (12/08/2022)  Financial Resource Strain: Low Risk  (09/27/2022)  Physical Activity: Inactive (07/31/2022)  Tobacco Use: Medium Risk (01/06/2023)    Readmission Risk Interventions    01/10/2023    2:05 PM 01/08/2023    3:01 PM  Readmission Risk Prevention Plan  Transportation Screening Complete Complete  PCP or Specialist Appt within 3-5 Days  Complete  HRI or Home Care Consult  Complete  Social Work Consult for Recovery Care Planning/Counseling  Complete  Palliative Care Screening  Not Applicable  Medication Review Oceanographer) Complete Complete  PCP or Specialist appointment within 3-5 days of discharge Complete   HRI or Home Care Consult Complete   SW Recovery Care/Counseling Consult Complete   Palliative Care Screening Complete   Skilled Nursing Facility Not Applicable

## 2023-01-10 NOTE — Progress Notes (Addendum)
NAME:  Travis Beck, MRN:  [id number], DOB:  1945-07-19, LOS: 4 ADMISSION DATE:  01/06/2023, CONSULTATION DATE:  01/08/2023 REFERRING MD:  Marland Mcalpine, CHIEF COMPLAINT:  Aspiration Pneumonia, nausea and vomiting   History of Present Illness:  77 y.o. male with medical history significant of IIDM, HTN, SIDAH, COPD, ( Former smoker quit 1994 with a 66 pack year smoking history diabetic neuropathy, recurrent pneumonia presented 01/06/2023 with worsening of nauseous vomiting cough and shortness of breath.  Patient was recently hospitalized 5/23-5/26 for multifocal MRSA pneumonia  and septic shock requiring vasopressor on admission-stabilized and subsequently discharged home on Augmentin/doxycycline . Pt returned to [facility] 12/07/2022 with fever of 103.1 and ongoing left sided pleuritic pain. He has a small effusion that was not big enough to tap.  He was admitted for incompletely treated PNA-as sputum cultures finalized postdischarge, and Doxycycline did not cover MRSA. He was treated with Zyvox, and discharged home on Zyvox p.o on 12/13/2022. Initially his breathing symptoms improved with Zyvox however patient continued to experience frequent nausea, vomiting and cough. He presented to the ED 6/29 as these symptoms were worsening.  In the ED he was mildly febrile at 99.6, He had  tachycardia, oxygen sats were 93% on 3 L. CXR again showed a multifocal pneumonia, right sided. Na 128/ Creatinine of 4.2, BUN 66 and WBC of 20. His HGB was 8.7.Respiratory Panel was negative. Lactic Acid trend since admission 2.7>>2.8>>1,3 Pulmonary was  asked to consult for recurrent vs slow to resolve pneumonia and hypoxemia . Of note , patient is followed in the [facility]  Pulmonary clinic by Dr. Isaiah Serge and Rubye Oaks NP.  Per family patient has had about a 15 pound weight loss since the end of May 24 when pneumonia was initially diagnosed.  Pertinent  Medical History   Past Medical History:  Diagnosis Date    Arthritis    Asthma    Childhood   Emphysema lung (HCC)    Essential hypertension    Ischemic heart disease    Myoview 2020 indicating inferior infarct scar with mild peri-infarct ischemia - managed medically   Type 2 diabetes mellitus (HCC)      Significant Hospital Events: Including procedures, antibiotic start and stop dates in addition to other pertinent events         5/23-5/26>> hospitalization for septic shock from multilobar PNA. 5/30>>6/5>> fever 103 F at home-ongoing left-sided pleuritic chest pain- 6/29>> Recurrent multifocal  ? Aspiration  pneumonia  Interim History / Subjective:   Overnight, no acute events.  Patient evaluated bedside this morning. Patient states that he wants to get the tube out.  He points to his tube.  He is able to shake his head to yes/no questions and follow all instructions.   CXR 01/08/2023>> shows extensive right lung / and mild left mid lung / left lower lobe interstitial airspace opacities>> [facility] wrote for some lasix  01/06/2023>>Swallow evaluation >> esophageal dysphagia, needs GI consult for esophageal dysphagia management. Family state patient eats very fast, and he dose not complain of cough or  getting choked.   Objective   Blood pressure (!) 86/55, pulse 73, temperature 98.2 F (36.8 C), temperature source Axillary, resp. rate 13, height 5\' 5"  (1.651 m), weight 72.5 kg, SpO2 93 %.  Temperature: 98 F-98.2 F, pulse 74-81, respiration rate 13-21 satting at 100% on ventilator PSV, PEEP 5, FiO2 40%, blood pressure with MAP 66-72 on 3 of Levophed.    Vent Mode: CPAP;PSV FiO2 (%):  [40 %-  70 %] 40 % Set Rate:  [20 bmp-24 bmp] 20 bmp Vt Set:  [490 mL] 490 mL PEEP:  [5 cmH20-8 cmH20] 5 cmH20 Pressure Support:  [8 cmH20-10 cmH20] 8 cmH20 Plateau Pressure:  [22 cmH20-28 cmH20] 22 cmH20   Intake/Output Summary (Last 24 hours) at 01/10/2023 0849 Last data filed at 01/10/2023 5409 Gross per 24 hour  Intake 1323.63 ml  Output 965 ml  Net 358.63 ml    Filed Weights   01/06/23 1322 01/10/23 0455  Weight: 71.7 kg 72.5 kg    Examination: General: Intubated, sedated, awake, able to follow instructions HENT:  Intubated, Atraumatic, normocephalic  Lungs: Coarse breath sounds throughout, good inspiratory effort Cardiovascular: Regular rate and rhythm   Abdomen:  Soft , Flat , BS +, Body mass index is 26.6 kg/m.  Extremities:  No obvious abnormalities, loss of tissue mass Neuro: Able to follow all instructions GU: Did have urine output yesterday, but decreased  Net +327.1 mL Yesterday: 725 mL of output urine   Labs reviewed 7/2 Phosphorus 8.0, mag 2.4 CBC: White count 10.6 (17.6) Hemoglobin 8.5 (7.8), platelets 531 (?) Sodium 129 (130), potassium 4.2 (3.8), CO2 17 (19), creatinine 5.14 (4.43) anion gap 18  Respiratory culture: Rare budding yeast Urine culture: No growth Blood culture: No growth 3 days  No new imaging  Resolved Hospital Problem list     Assessment & Plan:   #Sepsis #Acute hypoxic respiratory failure requiring intubation #Multifocal pneumonia, recurrent aspiration pneumonia #COPD #Leukocytosis, resolving Patient evaluated bedside this morning.  Blood cultures with no growth to date.  Urine culture with no growth.  Respiratory culture growing yeast, likely contaminant.  Patient is improving with decreased ventilator settings today.  Continue with vancomycin and cefepime day 4/7.  Did switch to pressure support.  Did start steroids yesterday.  Potentially will start waking patient up today.  SBT today. -Continue Brovana, Pulmicort, Atrovent -Continue Xopenex/albuterol as needed -Aggressive pulmonary toilet with IS and flutter valve -Vancomycin and cefepime day 4/7 -Currently sedated with fentanyl and propofol -Monitor white counts  -Monitor fever curve  -Follow cultures  -Goal O2 88-92% -Continue stress dose steroids   #Septic shock 2/2 above Patient still requiring Levophed.  Patient currently on 3  mcg of Levophed.  MAP hovering in the high 60s to low 70s.  Started steroids yesterday.  Patient also received 2 days ago.   -Titrate to keep MAP greater than 65 -Currently on 3 Levophed, if needs more Levophed, likely will need central line. -Continue steroids  #Iron deficiency anemia Hemoglobin stable at this point.  No concern for bleeding.  Given concern for active infection, will not replace iron at this time. -Transfuse to keep hemoglobin greater than 7 -Monitor CBC  #AKI #Anion gap metabolic acidosis #Hyponatremia Baseline creatinine around 0.7.  Creatinine this morning is 5.14.  Patient is having some urine output.  Likely ATN in setting of hypoperfusion.  Could consider nephrology consult today.  Will continue to monitor.  Patient does have anion gap metabolic acidosis likely lactic acidosis.  Patient did have decrease in bicarb at 17 today.  Obtain ABG. -ABG pending -Monitor BMP -Consider nephrology consult -Will give fluid challenge today   #Esophageal Dysmotility ( per swallow eval 6/30) possibly contributing to slow to resolve pneumonia GI has signed off, if patient does improve, become more stable, they will intervene at that point.  Will need to consult Once patient more stable.  Did speak to family, and they requested Dr. Loreta Ave of GI if she  is available. -Speech evaluation today  -Continue feeds with cortrak  #Weight loss >> 15 pounds in the last month Protein supplements dietitian following.  Started trickle feeds  Best Practice (right click and "Reselect all SmartList Selections" daily)   Diet/type: trickle feeds  DVT prophylaxis: Heparin GI prophylaxis: Famotidine Lines: 3 peripheral IVs, Foley urinary catheter, Cortrak Foley:  N/A Code Status:  full code Last date of multidisciplinary goals of care discussion  Labs   CBC: Recent Labs  Lab 01/06/23 1332 01/07/23 0145 01/08/23 0407 01/08/23 1148 01/08/23 1622 01/08/23 1702 01/08/23 2132 01/09/23 0009  01/09/23 0135 01/10/23 0527  WBC 20.2*   < > 14.9* 14.9* 17.2*  --   --   --  17.6* 10.6*  NEUTROABS 18.1*  --  11.6*  --  14.4*  --   --   --   --   --   HGB 8.7*   < > 6.4* 7.9* 10.0* 9.9* 9.9* 8.8* 7.8* 8.5*  HCT 26.6*   < > 19.8* 24.0* 29.6* 29.0* 29.0* 26.0* 23.3* 24.6*  MCV 88.1   < > 86.8 87.6 83.1  --   --   --  85.7 84.5  PLT 587*   < > 649* 557* 611*  --   --   --  474* 531*   < > = values in this interval not displayed.    Basic Metabolic Panel: Recent Labs  Lab 01/07/23 0145 01/08/23 0407 01/08/23 1622 01/08/23 1702 01/08/23 2132 01/09/23 0009 01/09/23 0135 01/09/23 1800 01/10/23 0527  NA 126* 129* 126* 126* 127* 128* 130*  --  129*  K 3.8 3.8 3.9 4.0 4.0 4.1 3.8  --  4.2  CL 98 95* 96*  --   --   --  94*  --  94*  CO2 17* 19* 17*  --   --   --  19*  --  17*  GLUCOSE 76 70 156*  --   --   --  180*  --  281*  BUN 63* 65* 66*  --   --   --  68*  --  84*  CREATININE 3.94* 4.19* 4.26*  --   --   --  4.43*  --  5.14*  CALCIUM 7.1* 7.8* 7.8*  --   --   --  7.4*  --  7.5*  MG  --  1.7 2.3  --   --   --  2.1 2.2 2.4  PHOS  --  4.0 5.2*  --   --   --   --  7.4* 8.0*   GFR: Estimated Creatinine Clearance: 10.1 mL/min (A) (by C-G formula based on SCr of 5.14 mg/dL (H)). Recent Labs  Lab 01/06/23 1540 01/06/23 1758 01/06/23 2303 01/07/23 0145 01/08/23 0407 01/08/23 1148 01/08/23 1622 01/09/23 0135 01/10/23 0527  WBC  --   --   --  15.0*   < > 14.9* 17.2* 17.6* 10.6*  LATICACIDVEN 2.7* 3.7* 2.8* 1.3  --   --   --   --   --    < > = values in this interval not displayed.    Liver Function Tests: Recent Labs  Lab 01/08/23 0407 01/08/23 1622  AST 43* 35  ALT 16 15  ALKPHOS 79 87  BILITOT 0.4 0.5  PROT 6.0* 6.0*  ALBUMIN <1.5* 1.5*   No results for input(s): "LIPASE", "AMYLASE" in the last 168 hours. No results for input(s): "AMMONIA" in the last 168 hours.  ABG  Component Value Date/Time   PHART 7.213 (L) 01/09/2023 0009   PCO2ART 52.9 (H)  01/09/2023 0009   PO2ART 170 (H) 01/09/2023 0009   HCO3 17.9 (L) 01/10/2023 0732   TCO2 23 01/09/2023 0009   ACIDBASEDEF 7.1 (H) 01/10/2023 0732   O2SAT 93.2 01/10/2023 0732     Coagulation Profile: Recent Labs  Lab 01/06/23 1540  INR 1.2    Cardiac Enzymes: No results for input(s): "CKTOTAL", "CKMB", "CKMBINDEX", "TROPONINI" in the last 168 hours.  HbA1C: Hgb A1c MFr Bld  Date/Time Value Ref Range Status  12/01/2022 05:41 AM 6.8 (H) 4.8 - 5.6 % Final    Comment:    (NOTE)         Prediabetes: 5.7 - 6.4         Diabetes: >6.4         Glycemic control for adults with diabetes: <7.0     CBG: Recent Labs  Lab 01/09/23 1535 01/09/23 1915 01/09/23 2310 01/10/23 0359 01/10/23 0717  GLUCAP 184* 228* 211* 226* 275*    Review of Systems:   Shortness of breath with exertion and nausea, which is better than when admitted  Past Medical History:  He,  has a past medical history of Arthritis, Asthma, Emphysema lung (HCC), Essential hypertension, Ischemic heart disease, and Type 2 diabetes mellitus (HCC).   Surgical History:   Past Surgical History:  Procedure Laterality Date   BIOPSY  08/30/2018   Procedure: BIOPSY;  Surgeon: Malissa Hippo, MD;  [facility];  Service: Endoscopy;;  gastric    BIOPSY  11/05/2020   Procedure: BIOPSY;  Surgeon: Dolores Frame, MD;  [facility];  Service: Gastroenterology;;   COLONOSCOPY     COLONOSCOPY WITH PROPOFOL N/A 11/05/2020   Procedure: COLONOSCOPY WITH PROPOFOL;  Surgeon: Dolores Frame, MD;  [facility];  Service: Gastroenterology;  Laterality: N/A;  Am   ESOPHAGEAL DILATION N/A 05/21/2019   Procedure: ESOPHAGEAL DILATION;  Surgeon: Malissa Hippo, MD;  [facility];  Service: Endoscopy;  Laterality: N/A;   ESOPHAGOGASTRODUODENOSCOPY N/A 05/21/2019   Procedure: ESOPHAGOGASTRODUODENOSCOPY (EGD);  Surgeon: Malissa Hippo, MD;  [facility];  Service:  Endoscopy;  Laterality: N/A;  730   ESOPHAGOGASTRODUODENOSCOPY (EGD) WITH PROPOFOL N/A 08/30/2018   Procedure: ESOPHAGOGASTRODUODENOSCOPY (EGD) WITH PROPOFOL;  Surgeon: Malissa Hippo, MD;  [facility];  Service: Endoscopy;  Laterality: N/A;  1:30   ESOPHAGOGASTRODUODENOSCOPY (EGD) WITH PROPOFOL N/A 11/05/2020   Procedure: ESOPHAGOGASTRODUODENOSCOPY (EGD) WITH PROPOFOL;  Surgeon: Dolores Frame, MD;  [facility];  Service: Gastroenterology;  Laterality: N/A;   HERNIA REPAIR     bilateral lower abdomen   POLYPECTOMY  11/05/2020   Procedure: POLYPECTOMY;  Surgeon: Dolores Frame, MD;  [facility];  Service: Gastroenterology;;   Gaspar Bidding DILATION  11/05/2020   Procedure: Gaspar Bidding DILATION;  Surgeon: Marguerita Merles, Reuel Boom, MD;  [facility]: [facility];  Service: Gastroenterology;;     Social History:   reports that he quit smoking about 29 years ago. His smoking use included cigarettes. He has a 66.00 pack-year smoking history. He has never used smokeless tobacco. He reports that he does not currently use alcohol. He reports that he does not use drugs.   Family History:  His family history is not on file.   Allergies Allergies  Allergen Reactions   Penicillins Rash    No problems with ampicillin during hospitalization     Home Medications  Prior to Admission medications   Medication Sig Start Date End Date Taking? Authorizing Provider  acetaminophen (TYLENOL) 325 MG tablet Take 2 tablets (650 mg total) by mouth every 6 (six) hours as needed for mild pain (or Fever >/= 101). 11/26/19  Yes Emokpae, Courage, MD  albuterol (VENTOLIN HFA) 108 (90 Base) MCG/ACT inhaler Inhale 2 puffs into the lungs every 4 (four) hours as needed for wheezing or shortness of breath. Patient taking differently: Inhale 1 puff into the lungs every 4 (four) hours as needed for wheezing or shortness of breath. 11/26/19  Yes Emokpae, Courage, MD  ALPRAZolam Prudy Feeler)  1 MG tablet Take 1 tablet (1 mg total) by mouth 3 (three) times daily as needed for anxiety. Patient taking differently: Take 1-2 mg by mouth at bedtime as needed for anxiety or sleep. 01/28/19  Yes Welborn, Ryan, DO  amLODipine (NORVASC) 10 MG tablet Take 10 mg by mouth at bedtime.   Yes [provider]  Budeson-Glycopyrrol-Formoterol (BREZTRI AEROSPHERE) 160-9-4.8 MCG/ACT AERO Inhale 2 puffs into the lungs in the morning and at bedtime. 12/13/22  Yes Leroy Sea, MD  buprenorphine (SUBUTEX) 8 MG SUBL SL tablet Place 4 mg under the tongue as needed (pain). 01/04/19  Yes [provider]  cetirizine (ZYRTEC) 10 MG tablet Take 10 mg by mouth at bedtime.   Yes [provider]  famotidine (PEPCID) 20 MG tablet Take 1 tablet (20 mg total) by mouth 2 (two) times daily for 7 days. Take one tablet twice daily for two days Patient taking differently: Take 20 mg by mouth as needed for heartburn or indigestion. 12/30/22 01/06/23 Yes Gerhard Munch, MD  fluticasone [facility]) 50 MCG/ACT nasal spray Place 1 spray into both nostrils daily. 08/22/18  Yes [provider]  gabapentin (NEURONTIN) 400 MG capsule Take 1 capsule (400 mg total) by mouth every 8 (eight) hours as needed. Patient taking differently: Take 400-800 mg by mouth as needed (pain). 01/28/19  Yes Welborn, Ryan, DO  glipiZIDE (GLUCOTROL XL) 10 MG 24 hr tablet Take 10 mg by mouth daily. 11/06/22  Yes [provider]  guaiFENesin (MUCINEX) 600 MG 12 hr tablet Take 1 tablet (600 mg total) by mouth 2 (two) times daily as needed for cough. Patient taking differently: Take 600 mg by mouth 2 (two) times daily as needed for cough or to loosen phlegm. 12/13/22  Yes Leroy Sea, MD  metFORMIN (GLUCOPHAGE) 1000 MG tablet Take 1,000 mg by mouth 2 (two) times daily with a meal.  12/07/14  Yes [provider]  montelukast (SINGULAIR) 10 MG tablet Take 1 tablet (10 mg total) by mouth at bedtime. 06/15/22  Yes  Mannam, Praveen, MD  ondansetron (ZOFRAN-ODT) 4 MG disintegrating tablet Take 1 tablet (4 mg total) by mouth every 8 (eight) hours as needed for nausea or vomiting. Consider taking 30 minutes prior to meals. Patient taking differently: Take 4 mg by mouth as needed for nausea or vomiting. 12/30/22  Yes Gerhard Munch, MD  pantoprazole (PROTONIX) 40 MG tablet Take 1 tablet (40 mg total) by mouth daily. 12/15/20  Yes Dolores Frame, MD  pramipexole (MIRAPEX) 0.5 MG tablet Take 0.5 mg by mouth at bedtime. 11/04/22  Yes [provider]  pravastatin (PRAVACHOL) 20 MG tablet Take 20 mg by mouth every evening.   Yes [provider]  sucralfate (CARAFATE) 1 g tablet Take 1 tablet (1 g total) by mouth 4 (four) times daily -  with meals and at bedtime. 12/30/22  Yes  Gerhard Munch, MD  valsartan (DIOVAN) 160 MG tablet Take 160 mg by mouth daily. 11/06/22  Yes [provider]  aspirin EC 81 MG tablet Take 1 tablet (81 mg total) by mouth daily. Swallow whole. Patient not taking: Reported on 01/06/2023 09/30/20   Jonelle Sidle, MD  linezolid (ZYVOX) 600 MG tablet Take 1 tablet (600 mg total) by mouth every 12 (twelve) hours. Patient not taking: Reported on 01/06/2023 12/13/22   Leroy Sea, MD        Modena Slater, DO  Internal Medicine Resident PGY-2 01/10/2023 8:49 AM

## 2023-01-10 NOTE — Progress Notes (Signed)
This chaplain responded to PMT [facility] consult for creating/updating the Pt. Advance Directive:  HCPOA only. The Pt. did not complete a Living Will.   The Pt. sister-Marie and niece-Paula are at the bedside during Pt. AD education. The Pt. is able to answer clarifying questions on the purpose of HCPOA.  The Pt. named [name] as HCPOA. The Pt. second choice, if this person is unwilling or unable to serve in this role is [name].  The chaplain gave the Pt. the original AD along with two copies. The chaplain scanned the Pt. AD into the Pt. EMR.  This chaplain is available for F/U spiritual care as needed.  Chaplain Stephanie Acre [phone]

## 2023-01-10 NOTE — Progress Notes (Addendum)
Pharmacy ICU Bowel Regimen Consult Note   Current Inpatient Medications for Bowel Management:  Miralax BID Senna HS Reglan 5 IV q8h GI on board for esophageal dysmotility  Assessment: Travis Beck is a 77 y.o. year old male admitted on 01/06/2023. Constipation identified as non-opioid-induced constipation . Bowel regimen assessment completed by Lissa Hoard, RN on 7/2. LBM on prior to admission.  [x]  Bowel sounds present - hypoactive [x]  No abdominal tenderness  []  Passing gas   Plan: Increase Senna to BID KUB today? - No per Dr. Wynona Neat  Adding suppository x1 today as well per discussion with Dr. Wynona Neat (off protocol)  MD contacted (if needed): [place]  Thank you for allowing pharmacy to participate in this patient's care.  Link Snuffer, PharmD, BCPS, BCCCP Clinical Pharmacist Please refer to [facility] for [facility] Pharm[facility] numbers 01/10/2023 8:00 AM

## 2023-01-10 NOTE — Progress Notes (Signed)
RT called to bedside due to pt having increased agitation, WOB and destating in the low 80s. RT increased O2 on heated HFNC to 90%/50L and pt sats went up to 86%. Pt was then also placed on a NRB, sats currently 95%. RT will continue to monitor.

## 2023-01-10 NOTE — Progress Notes (Signed)
Heated HF oxygen started

## 2023-01-10 NOTE — Evaluation (Signed)
Clinical/Bedside Swallow Evaluation Patient Details  Name: Travis Beck MRN: [id number] Date of Birth: [birth year]  Today's Date: 01/10/2023 Time: SLP Start Time (ACUTE ONLY): 1000 SLP Stop Time (ACUTE ONLY): 1015 SLP Time Calculation (min) (ACUTE ONLY): 15 min  Past Medical History:  Past Medical History:  Diagnosis Date   Arthritis    Asthma    Childhood   Emphysema lung (HCC)    Essential hypertension    Ischemic heart disease    Myoview 2020 indicating inferior infarct scar with mild peri-infarct ischemia - managed medically   Type 2 diabetes mellitus (HCC)    Past Surgical History:  Past Surgical History:  Procedure Laterality Date   BIOPSY  08/30/2018   Procedure: BIOPSY;  Surgeon: Malissa Hippo, MD;  [facility]: [facility];  Service: Endoscopy;;  gastric    BIOPSY  11/05/2020   Procedure: BIOPSY;  Surgeon: Dolores Frame, MD;  Location: [facility];  Service: Gastroenterology;;   COLONOSCOPY     COLONOSCOPY WITH PROPOFOL N/A 11/05/2020   Procedure: COLONOSCOPY WITH PROPOFOL;  Surgeon: Dolores Frame, MD;  [facility]: [facility];  Service: Gastroenterology;  Laterality: N/A;  Am   ESOPHAGEAL DILATION N/A 05/21/2019   Procedure: ESOPHAGEAL DILATION;  Surgeon: Malissa Hippo, MD;  [facility]: [facility];  Service: Endoscopy;  Laterality: N/A;   ESOPHAGOGASTRODUODENOSCOPY N/A 05/21/2019   Procedure: ESOPHAGOGASTRODUODENOSCOPY (EGD);  Surgeon: Malissa Hippo, MD;  [facility]: [facility];  Service: Endoscopy;  Laterality: N/A;  730   ESOPHAGOGASTRODUODENOSCOPY (EGD) WITH PROPOFOL N/A 08/30/2018   Procedure: ESOPHAGOGASTRODUODENOSCOPY (EGD) WITH PROPOFOL;  Surgeon: Malissa Hippo, MD;  [facility]: [facility];  Service: Endoscopy;  Laterality: N/A;  1:30   ESOPHAGOGASTRODUODENOSCOPY (EGD) WITH PROPOFOL N/A 11/05/2020   Procedure: ESOPHAGOGASTRODUODENOSCOPY (EGD) WITH PROPOFOL;  Surgeon: Dolores Frame, MD;  [facility]: [facility];   Service: Gastroenterology;  Laterality: N/A;   HERNIA REPAIR     bilateral lower abdomen   POLYPECTOMY  11/05/2020   Procedure: POLYPECTOMY;  Surgeon: Marguerita Merles, Reuel Boom, MD;  Location: [facility];  Service: Gastroenterology;;   Gaspar Bidding DILATION  11/05/2020   Procedure: Gaspar Bidding DILATION;  Surgeon: Marguerita Merles, Reuel Boom, MD;  Location: [facility];  Service: Gastroenterology;;   HPI:  Patient is a 77 y.o. male with PMH: HTN, DM-2, COPD, recurrent PNA, esophageal dysphagia (has had EGD's with dilation in 2020 and most recently in 2022). He presented to the hospital on 01/06/2023 with nausea, comiting, cough and SOB. He was recently admitted and discharged back home three weeks ago for multifocal PNA. He presented to [facility] 12/29/22 with nausea, vomiting and GI cough; symptoms improved but have returned. He had MBS with SLP on 01/07/23 which reported a primary esophageal based dysphagia and no further intervention needed. On 01/08/23, patient with respiratory distress, using accessory muscles to breath, SpO2 77-82% and patient requiring intubation. He was extubated on 01/10/2023 at 0815 to 10L HFNC and kept NPO awaiting SLP swallow evaluation. GI was consulted as well and per GI note from 01/09/23, with recommendation for outpatient barium swallow and esophageal manometry due to concern for esophageal dysmotility as well as possible EGD which could be done when his respiratory issues have improved.    Assessment / Plan / Recommendation  Clinical Impression  Patient presents with clinical s/s of dysphagia as per this bedside swallow evaluation. He was intubated for three days, extubated this morning at 0815. His voice was mildly hoarse but overall clear and strong. He  complained of abdominal/intestinal pain and he and family member report he has not had a BM or even flatus since last Thursday (01/04/23) but has had some belching. SLP assessed his swallow function via straw sips of thin liquids (water). He  did not exhibit any immediate or delayed coughing and swallow initiation appeared timely. He did exhibit drop in SpO2 while sitting up and drinking water (dropping to mid-80's from low 90's) but this may also have been due to patient become somewhat anxious and upset when he spilled some water on himself and in the bed. (family member reports that at baseline, things such as spilling drinks upsets him and he gets anxious) When patient's oxygen had returned to baseline, he drank some more sips of water and this time, no change in vitals observed. SLP recommending continue NPO status but allow water sips.      Aspiration Risk  Mild aspiration risk    Diet Recommendation NPO;Free water protocol after oral care    Liquid Administration via: Cup;Straw Medication Administration: Via alternative means Supervision: Patient able to self feed;Full supervision/cueing for compensatory strategies Compensations: Slow rate;Minimize environmental distractions Postural Changes: Seated upright at 90 degrees;Remain upright for at least 30 minutes after po intake    Other  Recommendations Oral Care Recommendations: Oral care BID    Recommendations for follow up therapy are one component of a multi-disciplinary discharge planning process, led by the attending physician.  Recommendations may be updated based on patient status, additional functional criteria and insurance authorization.  Follow up Recommendations Follow physician's recommendations for discharge plan and follow up therapies      Assistance Recommended at Discharge    Functional Status Assessment Patient has had a recent decline in their functional status and demonstrates the ability to make significant improvements in function in a reasonable and predictable amount of time.  Frequency and Duration min 2x/week  1 week       Prognosis Prognosis for improved oropharyngeal function: Good      Swallow Study   General Date of Onset:  01/08/23 HPI: Patient is a 77 y.o. male with PMH: HTN, DM-2, COPD, recurrent PNA, esophageal dysphagia (has had EGD's with dilation in 2020 and most recently in 2022). He presented to the hospital on 01/06/2023 with nausea, comiting, cough and SOB. He was recently admitted and discharged back home three weeks ago for multifocal PNA. He presented to [facility]/29/22 with nausea, vomiting and GI cough; symptoms improved but have returned. He had MBS with SLP on 01/07/23 which reported a primary esophageal based dysphagia and no further intervention needed. On 01/08/23, patient with respiratory distress, using accessory muscles to breath, SpO2 77-82% and patient requiring intubation. He was extubated on 01/10/2023 at 0815 to 10L HFNC and kept NPO awaiting SLP swallow evaluation. GI was consulted as well and per GI note from 01/09/23, with recommendation for outpatient barium swallow and esophageal manometry due to concern for esophageal dysmotility as well as possible EGD which could be done when his respiratory issues have improved. Type of Study: Bedside Swallow Evaluation Previous Swallow Assessment: BSE and MBS end of last month Diet Prior to this Study: NPO Temperature Spikes Noted: No Respiratory Status: Nasal cannula History of Recent Intubation: Yes Total duration of intubation (days): 3 days Date extubated: 01/10/23 Behavior/Cognition: Alert;Cooperative;Pleasant mood Oral Cavity Assessment: Dry Oral Care Completed by SLP: Yes Oral Cavity - Dentition: Adequate natural dentition Vision: Functional for self-feeding Self-Feeding Abilities: Able to feed self;Needs assist Patient Positioning: Upright in bed  Baseline Vocal Quality: Hoarse Volitional Cough: Strong Volitional Swallow: Able to elicit    Oral/Motor/Sensory Function Overall Oral Motor/Sensory Function: Within functional limits   Ice Chips     Thin Liquid Thin Liquid: Impaired Presentation: Straw;Self Fed Pharyngeal  Phase Impairments: Change  in Vital Signs Other Comments: Patient dropped cup (with lid on) of water when holding it, spilling water on self and in bed; this upset him and seemed anxious, resulting in his SpO2 to decline from low 90's to mid 80's. Per family member in room, patient gets anxious over things such as spilling/making a mess at baseline    Nectar Thick     Honey Thick     Puree Puree: Not tested   Solid     Solid: Not tested      Angela Nevin, MA, CCC-SLP Speech Therapy

## 2023-01-10 NOTE — Progress Notes (Signed)
Pt taken of HFNC (salter) and placed on Heated HFNC per MD for high O2 demands. Pt is tolerating it well at this time and is resting comfortably. RT will monitor as needed.

## 2023-01-10 NOTE — Progress Notes (Signed)
Pharmacy Antibiotic Note- follow-up  Travis Beck is a 77 y.o. male admitted on 01/06/2023 with pneumonia.  Pharmacy has been consulted for cefepime and vancomycin dosing. Of note, pt was admitted for MRSA PNA in May 2024. Received inpatient abx and was discharged on doxycycline. Unfortunately, the MRSA susceptibilities revealed resistance to doxycycline. Pt re-presented with persistent symptoms and then took linezolid 600mg  PO BID x7 days (6/5-6/11/24).   Vancomycin random = 28 (~23 hrs from last dose). SCr rising. UOP minimal. Will continue to check levels and dose based on levels.   Plan: Hold Vancomycin today and recheck level in AM.  Continue Cefepime 2g IV q24h Monitor CBC, clinical picture, renal function, and cultures   Height: 5\' 5"  (165.1 cm) Weight: 72.5 kg (159 lb 13.3 oz) IBW/kg (Calculated) : 61.5  Temp (24hrs), Avg:97.6 F (36.4 C), Min:96.7 F (35.9 C), Max:98.2 F (36.8 C)  Recent Labs  Lab 01/06/23 1540 01/06/23 1758 01/06/23 2234 01/06/23 2303 01/07/23 0145 01/07/23 1005 01/08/23 0407 01/08/23 1148 01/08/23 1622 01/09/23 0135 01/10/23 0527 01/10/23 0853  WBC  --   --   --   --  15.0*  --  14.9* 14.9* 17.2* 17.6* 10.6*  --   CREATININE  --   --    < >  --  3.94*  --  4.19*  --  4.26* 4.43* 5.14*  --   LATICACIDVEN 2.7* 3.7*  --  2.8* 1.3  --   --   --   --   --   --   --   VANCORANDOM  --   --   --   --   --    < > 28  --   --  17  --  28   < > = values in this interval not displayed.    Estimated Creatinine Clearance: 10.1 mL/min (A) (by C-G formula based on SCr of 5.14 mg/dL (H)).    Allergies  Allergen Reactions   Penicillins Rash    No problems with ampicillin during hospitalization    Antimicrobials this admission: Azithromycin 6/29 x1 dose Cefepime 6/29 > Vancomycin 6/29 >  Microbiology results: 7/1 MRSA neg 7/1 resp cx- re-incubated 6/29- Ucx- negative 6/29- Bcx- ngtd x4d 5/30- MRSA pos  Thank you for allowing pharmacy to  be a part of this patient's care.  Link Snuffer, PharmD, BCPS, BCCCP Clinical Pharmacist 01/10/2023 10:27 AM

## 2023-01-10 NOTE — Consult Note (Signed)
Reason for Consult: AKI Referring Physician: Wynona Neat, MD  Travis Beck is an 77 y.o. male with a PMH significant for HTN, DM type 2, SIADH, COPD, and recurrent pneumonia who presented to [facility] ED on 01/06/23 with worsening N/V, cough, and SOB.  In the ED, temp 99.6, SpO2 93% on 3 L, CXR with multifocal pneumonia on the right.  Labs notable for Na 128, BUN 66, Cr 4.2, WBC 20, Hgb 8.7.  He was admitted for IV antibiotics and PCCM consulted on 01/08/23 and was transferred to ICU with sepsis and started on levophed.  We were consulted due to the development of AKI.  The trend in Scr is seen below.   Of note, he had been on valsartan and metformin prior to admission.  He also received 3 doses of IV vancomycin with trough level of 28.   Trend in Creatinine: Creatinine, Ser  Date/Time Value Ref Range Status  01/10/2023 05:27 AM 5.14 (H) 0.61 - 1.24 mg/dL Final  86/57/8469 62:95 AM 4.43 (H) 0.61 - 1.24 mg/dL Final  28/41/3244 01:02 PM 4.26 (H) 0.61 - 1.24 mg/dL Final  72/53/6644 03:47 AM 4.19 (H) 0.61 - 1.24 mg/dL Final  42/59/5638 75:64 AM 3.94 (H) 0.61 - 1.24 mg/dL Final  33/29/5188 41:66 PM 3.90 (H) 0.61 - 1.24 mg/dL Final  01/07/1600 09:32 PM 4.20 (H) 0.61 - 1.24 mg/dL Final  35/57/3220 25:42 AM 1.95 (H) 0.61 - 1.24 mg/dL Final  70/62/3762 83:15 AM 0.94 0.61 - 1.24 mg/dL Final  17/61/6073 71:06 AM 0.90 0.61 - 1.24 mg/dL Final  26/94/8546 27:03 AM 0.69 0.61 - 1.24 mg/dL Final  50/03/3817 29:93 AM 0.78 0.61 - 1.24 mg/dL Final  71/69/6789 38:10 AM 0.76 0.61 - 1.24 mg/dL Final  17/51/0258 52:77 PM 0.82 0.61 - 1.24 mg/dL Final  82/42/3536 14:43 AM 0.89 0.61 - 1.24 mg/dL Final  15/40/0867 61:95 AM 0.85 0.61 - 1.24 mg/dL Final  09/32/6712 45:80 AM 1.14 0.61 - 1.24 mg/dL Final  99/83/3825 05:39 PM 1.36 (H) 0.61 - 1.24 mg/dL Final  76/73/4193 79:02 AM 0.89 0.61 - 1.24 mg/dL Final  40/97/3532 99:24 AM 0.86 0.61 - 1.24 mg/dL Final  26/83/4196 22:29 AM 0.74 0.61 - 1.24 mg/dL Final  79/89/2119  41:74 AM 0.82 0.61 - 1.24 mg/dL Final  02/20/4817 56:31 PM 0.98 0.61 - 1.24 mg/dL Final  49/70/2637 85:88 AM 0.93 0.61 - 1.24 mg/dL Final  50/27/7412 87:86 AM 0.88 0.61 - 1.24 mg/dL Final  76/72/0947 09:62 AM 1.10 0.61 - 1.24 mg/dL Final  83/66/2947 65:46 PM 0.95 0.61 - 1.24 mg/dL Final  50/35/4656 81:27 PM 0.81 0.61 - 1.24 mg/dL Final    PMH:   Past Medical History:  Diagnosis Date   Arthritis    Asthma    Childhood   Emphysema lung (HCC)    Essential hypertension    Ischemic heart disease    Myoview 2020 indicating inferior infarct scar with mild peri-infarct ischemia - managed medically   Type 2 diabetes mellitus (HCC)     PSH:   Past Surgical History:  Procedure Laterality Date   BIOPSY  08/30/2018   Procedure: BIOPSY;  Surgeon: Malissa Hippo, MD;  [facility];  Service: Endoscopy;;  gastric    BIOPSY  11/05/2020   Procedure: BIOPSY;  Surgeon: Dolores Frame, MD;  [facility];  Service: Gastroenterology;;   COLONOSCOPY     COLONOSCOPY WITH PROPOFOL N/A 11/05/2020   Procedure: COLONOSCOPY WITH PROPOFOL;  Surgeon: Dolores Frame, MD;  [facility];  Service: Gastroenterology;  Laterality: N/A;  Am   ESOPHAGEAL DILATION N/A 05/21/2019   Procedure: ESOPHAGEAL DILATION;  Surgeon: Malissa Hippo, MD;  [facility];  Service: Endoscopy;  Laterality: N/A;   ESOPHAGOGASTRODUODENOSCOPY N/A 05/21/2019   Procedure: ESOPHAGOGASTRODUODENOSCOPY (EGD);  Surgeon: Malissa Hippo, MD;  [facility];  Service: Endoscopy;  Laterality: N/A;  730   ESOPHAGOGASTRODUODENOSCOPY (EGD) WITH PROPOFOL N/A 08/30/2018   Procedure: ESOPHAGOGASTRODUODENOSCOPY (EGD) WITH PROPOFOL;  Surgeon: Malissa Hippo, MD;  [facility];  Service: Endoscopy;  Laterality: N/A;  1:30   ESOPHAGOGASTRODUODENOSCOPY (EGD) WITH PROPOFOL N/A 11/05/2020   Procedure: ESOPHAGOGASTRODUODENOSCOPY (EGD) WITH PROPOFOL;  Surgeon: Dolores Frame, MD;  [facility];  Service: Gastroenterology;  Laterality: N/A;   HERNIA REPAIR     bilateral lower abdomen   POLYPECTOMY  11/05/2020   Procedure: POLYPECTOMY;  Surgeon: Dolores Frame, MD;  [facility];  Service: Gastroenterology;;   Gaspar Bidding DILATION  11/05/2020   Procedure: Gaspar Bidding DILATION;  Surgeon: Marguerita Merles, Reuel Boom, MD;  [facility];  Service: Gastroenterology;;    Allergies:  Allergies  Allergen Reactions   Penicillins Rash    No problems with ampicillin during hospitalization    Medications:   Prior to Admission medications   Medication Sig Start Date End Date Taking? Authorizing Provider  acetaminophen (TYLENOL) 325 MG tablet Take 2 tablets (650 mg total) by mouth every 6 (six) hours as needed for mild pain (or Fever >/= 101). 11/26/19  Yes Emokpae, Courage, MD  albuterol (VENTOLIN HFA) 108 (90 Base) MCG/ACT inhaler Inhale 2 puffs into the lungs every 4 (four) hours as needed for wheezing or shortness of breath. Patient taking differently: Inhale 1 puff into the lungs every 4 (four) hours as needed for wheezing or shortness of breath. 11/26/19  Yes Emokpae, Courage, MD  ALPRAZolam Prudy Feeler) 1 MG tablet Take 1 tablet (1 mg total) by mouth 3 (three) times daily as needed for anxiety. Patient taking differently: Take 1-2 mg by mouth at bedtime as needed for anxiety or sleep. 01/28/19  Yes Welborn, Ryan, DO  amLODipine (NORVASC) 10 MG tablet Take 10 mg by mouth at bedtime.   Yes [provider]  Budeson-Glycopyrrol-Formoterol (BREZTRI AEROSPHERE) 160-9-4.8 MCG/ACT AERO Inhale 2 puffs into the lungs in the morning and at bedtime. 12/13/22  Yes Leroy Sea, MD  buprenorphine (SUBUTEX) 8 MG SUBL SL tablet Place 4 mg under the tongue as needed (pain). 01/04/19  Yes [provider]  cetirizine (ZYRTEC) 10 MG tablet Take 10 mg by mouth at bedtime.   Yes [provider]  famotidine (PEPCID) 20 MG  tablet Take 1 tablet (20 mg total) by mouth 2 (two) times daily for 7 days. Take one tablet twice daily for two days Patient taking differently: Take 20 mg by mouth as needed for heartburn or indigestion. 12/30/22 01/06/23 Yes Gerhard Munch, MD  fluticasone [facility]) 50 MCG/ACT nasal spray Place 1 spray into both nostrils daily. 08/22/18  Yes [provider]  gabapentin (NEURONTIN) 400 MG capsule Take 1 capsule (400 mg total) by mouth every 8 (eight) hours as needed. Patient taking differently: Take 400-800 mg by mouth as needed (pain). 01/28/19  Yes Welborn, Ryan, DO  glipiZIDE (GLUCOTROL XL) 10 MG 24 hr tablet Take 10 mg by mouth daily. 11/06/22  Yes [provider]  guaiFENesin (MUCINEX) 600 MG 12 hr tablet Take 1 tablet (600 mg total) by mouth 2 (two)  times daily as needed for cough. Patient taking differently: Take 600 mg by mouth 2 (two) times daily as needed for cough or to loosen phlegm. 12/13/22  Yes Leroy Sea, MD  metFORMIN (GLUCOPHAGE) 1000 MG tablet Take 1,000 mg by mouth 2 (two) times daily with a meal.  12/07/14  Yes [provider]  montelukast (SINGULAIR) 10 MG tablet Take 1 tablet (10 mg total) by mouth at bedtime. 06/15/22  Yes Mannam, Praveen, MD  ondansetron (ZOFRAN-ODT) 4 MG disintegrating tablet Take 1 tablet (4 mg total) by mouth every 8 (eight) hours as needed for nausea or vomiting. Consider taking 30 minutes prior to meals. Patient taking differently: Take 4 mg by mouth as needed for nausea or vomiting. 12/30/22  Yes Gerhard Munch, MD  pantoprazole (PROTONIX) 40 MG tablet Take 1 tablet (40 mg total) by mouth daily. 12/15/20  Yes Dolores Frame, MD  pramipexole (MIRAPEX) 0.5 MG tablet Take 0.5 mg by mouth at bedtime. 11/04/22  Yes [provider]  pravastatin (PRAVACHOL) 20 MG tablet Take 20 mg by mouth every evening.   Yes [provider]  sucralfate (CARAFATE) 1 g tablet Take 1 tablet (1 g total) by mouth 4 (four)  times daily -  with meals and at bedtime. 12/30/22  Yes Gerhard Munch, MD  valsartan (DIOVAN) 160 MG tablet Take 160 mg by mouth daily. 11/06/22  Yes [provider]  aspirin EC 81 MG tablet Take 1 tablet (81 mg total) by mouth daily. Swallow whole. Patient not taking: Reported on 01/06/2023 09/30/20   Jonelle Sidle, MD  linezolid (ZYVOX) 600 MG tablet Take 1 tablet (600 mg total) by mouth every 12 (twelve) hours. Patient not taking: Reported on 01/06/2023 12/13/22   Leroy Sea, MD    Inpatient medications:  arformoterol  15 mcg Nebulization BID   aspirin  81 mg Per Tube Daily   budesonide (PULMICORT) nebulizer solution  0.5 mg Nebulization BID   Chlorhexidine Gluconate Cloth  6 each Topical Daily   famotidine  10 mg Per Tube Daily   feeding supplement (PROSource TF20)  60 mL Per Tube Daily   fluticasone  2 spray Each Nare Daily   guaiFENesin  15 mL Per Tube Q4H   heparin  5,000 Units Subcutaneous Q8H   hydrocortisone sod succinate (SOLU-CORTEF) inj  100 mg Intravenous Q12H   insulin aspart  0-20 Units Subcutaneous TID WC   insulin aspart  0-5 Units Subcutaneous QHS   loratadine  10 mg Per Tube Daily   metoCLOPramide (REGLAN) injection  5 mg Intravenous Q8H   montelukast  10 mg Per Tube QHS   pantoprazole (PROTONIX) IV  40 mg Intravenous Q12H   pramipexole  0.5 mg Per Tube QHS   pravastatin  20 mg Per Tube QPM   revefenacin  175 mcg Nebulization Daily   saccharomyces boulardii  250 mg Per Tube BID   senna  1 tablet Per Tube BID   sodium chloride HYPERTONIC  4 mL Nebulization BID   thiamine  100 mg Per Tube Daily   vancomycin variable dose per unstable renal function (pharmacist dosing)   Does not apply See admin instructions    Discontinued Meds:   Medications Discontinued During This Encounter  Medication Reason   vancomycin (VANCOCIN) IVPB 1000 mg/200 mL premix Dose change   ceFEPIme (MAXIPIME) 2 g in sodium chloride 0.9 % 100 mL IVPB Dose change   linezolid  (ZYVOX) tablet 600 mg    ALPRAZolam (XANAX) tablet  1 mg    sucralfate (CARAFATE) tablet 1 g    buprenorphine (SUBUTEX) SL tablet 4-8 mg Inpatient Standard   famotidine (PEPCID) tablet 20 mg    guaiFENesin (MUCINEX) 12 hr tablet 600 mg    amLODipine (NORVASC) 5 MG tablet Dose change   fluticasone furoate-vilanterol (BREO ELLIPTA) 200-25 MCG/ACT 1 puff    heparin injection 5,000 Units    buprenorphine (SUBUTEX) sublingual tablet 8 mg    buprenorphine (SUBUTEX) SL tablet 4 mg    pantoprazole (PROTONIX) EC tablet 40 mg    albuterol (PROVENTIL) (2.5 MG/3ML) 0.083% nebulizer solution 2.5 mg    budesonide (PULMICORT) nebulizer solution 0.25 mg    famotidine (PEPCID) tablet 20 mg    feeding supplement (GLUCERNA SHAKE) (GLUCERNA SHAKE) liquid 237 mL    multivitamin (RENA-VIT) tablet 1 tablet    albuterol (PROVENTIL) (2.5 MG/3ML) 0.083% nebulizer solution 2.5 mg    pravastatin (PRAVACHOL) tablet 20 mg    loratadine (CLARITIN) tablet 10 mg    montelukast (SINGULAIR) tablet 10 mg    gabapentin (NEURONTIN) capsule 100 mg Duplicate   acetaminophen (TYLENOL) tablet 650 mg    ALPRAZolam (XANAX) tablet 1 mg    guaiFENesin (MUCINEX) 12 hr tablet 1,200 mg Duplicate   aspirin EC tablet 81 mg Duplicate   acidophilus (RISAQUAD) capsule 1 capsule Duplicate   levalbuterol (XOPENEX) nebulizer solution 0.63 mg    ipratropium (ATROVENT) nebulizer solution 0.5 mg    arformoterol (BROVANA) nebulizer solution 15 mcg    succinylcholine (ANECTINE) 200 MG/10ML syringe Returned to ADS   ketamine HCl 50 MG/5ML SOSY Returned to ADS   buprenorphine (SUBUTEX) sublingual tablet 8 mg Discontinued by provider   buprenorphine (SUBUTEX) sublingual tablet 4 mg Discontinued by provider   vancomycin (VANCOCIN) IVPB 1000 mg/200 mL premix    0.9 %  sodium chloride infusion (Manually program via Guardrails IV Fluids)    famotidine (PEPCID) IVPB 20 mg premix    metoCLOPramide (REGLAN) injection 10 mg    polyethylene glycol  (MIRALAX / GLYCOLAX) packet 17 g    pramipexole (MIRAPEX) tablet 0.5 mg Inpatient Standard   insulin aspart (novoLOG) injection 0-9 Units    vancomycin variable dose per unstable renal function (pharmacist dosing)    senna (SENOKOT) tablet 8.6 mg    docusate (COLACE) 50 MG/5ML liquid 100 mg    fentaNYL in NS (14mcg/ml) infusion-PREMIX    fentaNYL (SUBLIMAZE) bolus via infusion 25-100 mcg    propofol (DIPRIVAN) 1000 MG/100ML infusion    polyethylene glycol (MIRALAX / GLYCOLAX) packet 17 g    insulin aspart (novoLOG) injection 0-9 Units     Social History:  reports that he quit smoking about 29 years ago. His smoking use included cigarettes. He has a 66.00 pack-year smoking history. He has never used smokeless tobacco. He reports that he does not currently use alcohol. He reports that he does not use drugs.  Family History:  History reviewed. No pertinent family history.  Pertinent items are noted in HPI. Weight change:   Intake/Output Summary (Last 24 hours) at 01/10/2023 1317 Last data filed at 01/10/2023 1000 Gross per 24 hour  Intake 1921.48 ml  Output 345 ml  Net 1576.48 ml   BP 112/61   Pulse 86   Temp 97.7 F (36.5 C) (Oral)   Resp 16   Ht 5\' 5"  (1.651 m)   Wt 72.5 kg   SpO2 90%   BMI 26.60 kg/m  Vitals:   01/10/23 0900 01/10/23 1122 01/10/23 1153 01/10/23  1304  BP: 112/61     Pulse: 86     Resp: 16     Temp:  97.7 F (36.5 C)    TempSrc:  Oral    SpO2: 92%  (!) 89% 90%  Weight:      Height:         General appearance: fatigued and mild distress Head: Normocephalic, without obvious abnormality, atraumatic Eyes: negative findings: lids and lashes normal, conjunctivae and sclerae normal, and corneas clear Resp: rales bilaterally and rhonchi bilaterally Cardio: regular rate and rhythm, S1, S2 normal, no murmur, click, rub or gallop GI: soft, non-tender; bowel sounds normal; no masses,  no organomegaly Extremities: extremities normal, atraumatic, no  cyanosis or edema  Labs: Basic Metabolic Panel: Recent Labs  Lab 01/06/23 1332 01/06/23 2234 01/07/23 0145 01/08/23 0407 01/08/23 1622 01/08/23 1702 01/08/23 2132 01/09/23 0009 01/09/23 0135 01/09/23 1800 01/10/23 0527  NA 128* 128* 126* 129* 126* 126* 127* 128* 130*  --  129*  K 3.9 3.8 3.8 3.8 3.9 4.0 4.0 4.1 3.8  --  4.2  CL 93* 94* 98 95* 96*  --   --   --  94*  --  94*  CO2 19* 18* 17* 19* 17*  --   --   --  19*  --  17*  GLUCOSE 190* 130* 76 70 156*  --   --   --  180*  --  281*  BUN 66* 65* 63* 65* 66*  --   --   --  68*  --  84*  CREATININE 4.20* 3.90* 3.94* 4.19* 4.26*  --   --   --  4.43*  --  5.14*  ALBUMIN  --   --   --  <1.5* 1.5*  --   --   --   --   --   --   CALCIUM 7.8* 7.7* 7.1* 7.8* 7.8*  --   --   --  7.4*  --  7.5*  PHOS  --   --   --  4.0 5.2*  --   --   --   --  7.4* 8.0*   Liver Function Tests: Recent Labs  Lab 01/08/23 0407 01/08/23 1622  AST 43* 35  ALT 16 15  ALKPHOS 79 87  BILITOT 0.4 0.5  PROT 6.0* 6.0*  ALBUMIN <1.5* 1.5*   No results for input(s): "LIPASE", "AMYLASE" in the last 168 hours. No results for input(s): "AMMONIA" in the last 168 hours. CBC: Recent Labs  Lab 01/06/23 1332 01/07/23 0145 01/08/23 0407 01/08/23 1148 01/08/23 1622 01/08/23 1702 01/08/23 2132 01/09/23 0009 01/09/23 0135 01/10/23 0527  WBC 20.2*   < > 14.9* 14.9* 17.2*  --   --   --  17.6* 10.6*  NEUTROABS 18.1*  --  11.6*  --  14.4*  --   --   --   --   --   HGB 8.7*   < > 6.4* 7.9* 10.0*   < > 9.9* 8.8* 7.8* 8.5*  HCT 26.6*   < > 19.8* 24.0* 29.6*   < > 29.0* 26.0* 23.3* 24.6*  MCV 88.1   < > 86.8 87.6 83.1  --   --   --  85.7 84.5  PLT 587*   < > 649* 557* 611*  --   --   --  474* 531*   < > = values in this interval not displayed.   PT/INR: @LABRCNTIP (inr:5) Cardiac Enzymes: )No results for input(s): "CKTOTAL", "  CKMB", "CKMBINDEX", "TROPONINI" in the last 168 hours. CBG: Recent Labs  Lab 01/09/23 1915 01/09/23 2310 01/10/23 0359  01/10/23 0717 01/10/23 1121  GLUCAP 228* 211* 226* 275* 234*    Iron Studies:  Recent Labs  Lab 01/08/23 0407  IRON <5*  TIBC 136*  FERRITIN 186    Xrays/Other Studies: Portable Chest x-ray  Result Date: 01/08/2023 CLINICAL DATA:  ETT placement EXAM: PORTABLE CHEST 1 VIEW COMPARISON:  01/08/2023 at 1710 hours FINDINGS: Endotracheal tube terminates 3.5 cm above the carina. Enteric tube courses into the stomach. Multifocal patchy opacities in the lungs bilaterally, unchanged, suggesting multifocal pneumonia. No pleural effusion or pneumothorax. The heart is normal in size. IMPRESSION: Endotracheal tube terminates 3.5 cm above the carina. Additional support apparatus as above. Multifocal patchy opacities in the lungs bilaterally, suggesting multifocal pneumonia. Electronically Signed   By: Charline Bills M.D.   On: 01/08/2023 20:43   DG CHEST PORT 1 VIEW  Result Date: 01/08/2023 CLINICAL DATA:  Respiratory failure. EXAM: PORTABLE CHEST 1 VIEW COMPARISON:  Chest radiograph dated 01/08/2023. FINDINGS: Feeding tube extends below the diaphragm with tip beyond the margin of the image. Interval worsening of bilateral pulmonary opacities compared to the earlier radiograph. No large pleural effusion. No pneumothorax. Stable cardiac silhouette no acute osseous pathology. IMPRESSION: Interval worsening of bilateral pulmonary opacities. Electronically Signed   By: Elgie Collard M.D.   On: 01/08/2023 17:45   DG Abd Portable 1V  Result Date: 01/08/2023 CLINICAL DATA:  Feeding tube EXAM: PORTABLE ABDOMEN - 1 VIEW COMPARISON:  12/30/2022 FINDINGS: Enteric tube tip overlies distal stomach. Small amount of residual contrast in the right upper quadrant bowel. Widespread bilateral airspace disease with small pleural effusions. IMPRESSION: Enteric tube tip overlies distal stomach. Electronically Signed   By: Jasmine Pang M.D.   On: 01/08/2023 15:08     Assessment/Plan:  AKI - presumably ischemic ATN in  setting of sepsis due to pneumonia and poor po intake. Vanco trough was high at 28.  No urgent indication for dialysis at this time.  Will continue to follow closely.  Hopefully his renal function will start to improve now that he is off of pressors.  Agree with IVF's. Avoid nephrotoxic medications including NSAIDs and iodinated intravenous contrast exposure unless the latter is absolutely indicated.  Preferred narcotic agents for pain control are hydromorphone, fentanyl, and methadone. Morphine should not be used. Avoid Baclofen and avoid oral sodium phosphate and magnesium citrate based laxatives / bowel preps. Continue strict Input and Output monitoring. Will monitor the patient closely with you and intervene or adjust therapy as indicated by changes in clinical status/labs   Sepsis - due to recurrent multifocal pneumonia - possible aspiration.  Currently off of norepinephrine.   Acute hypoxic respiratory failure - required intubation now extubated.  Plan per PCCM    Iron deficiency anemia - continue to follow SIADH - stable serum sodium levels. AGMA - due to #1.  Continue to follow Severe protein malnutrition - alb 1.5.  cortrak and supplements per PCCM. Esophageal dysmotility - speech path following.    Jomarie Longs A Aliesha Dolata 01/10/2023, 1:17 PM

## 2023-01-10 NOTE — Progress Notes (Signed)
eLink Physician-Brief Progress Note Patient Name: Gino Santora DOB: 1946/05/08 MRN: [id number]   Date of Service  01/10/2023  HPI/Events of Note  BP 93/42, MAP 58, HR 60.  eICU Interventions  Levo gtt ordered to keep MAP > 65 mmHg        Jadie Comas U Danaisha Celli 01/10/2023, 10:55 PM

## 2023-01-11 ENCOUNTER — Inpatient Hospital Stay (HOSPITAL_COMMUNITY): Payer: Medicare Other

## 2023-01-11 DIAGNOSIS — R1319 Other dysphagia: Secondary | ICD-10-CM | POA: Diagnosis not present

## 2023-01-11 DIAGNOSIS — N179 Acute kidney failure, unspecified: Secondary | ICD-10-CM | POA: Diagnosis not present

## 2023-01-11 DIAGNOSIS — J9601 Acute respiratory failure with hypoxia: Secondary | ICD-10-CM | POA: Diagnosis not present

## 2023-01-11 DIAGNOSIS — E440 Moderate protein-calorie malnutrition: Secondary | ICD-10-CM | POA: Diagnosis not present

## 2023-01-11 DIAGNOSIS — J189 Pneumonia, unspecified organism: Secondary | ICD-10-CM | POA: Diagnosis not present

## 2023-01-11 LAB — CBC
HCT: 28.3 % — ABNORMAL LOW (ref 39.0–52.0)
Hemoglobin: 9.6 g/dL — ABNORMAL LOW (ref 13.0–17.0)
MCH: 28.6 pg (ref 26.0–34.0)
MCHC: 33.9 g/dL (ref 30.0–36.0)
MCV: 84.2 fL (ref 80.0–100.0)
Platelets: 566 10*3/uL — ABNORMAL HIGH (ref 150–400)
RBC: 3.36 MIL/uL — ABNORMAL LOW (ref 4.22–5.81)
RDW: 15.1 % (ref 11.5–15.5)
WBC: 16.9 10*3/uL — ABNORMAL HIGH (ref 4.0–10.5)
nRBC: 0 % (ref 0.0–0.2)

## 2023-01-11 LAB — CULTURE, RESPIRATORY W GRAM STAIN

## 2023-01-11 LAB — BASIC METABOLIC PANEL
Anion gap: 17 — ABNORMAL HIGH (ref 5–15)
BUN: 93 mg/dL — ABNORMAL HIGH (ref 8–23)
CO2: 15 mmol/L — ABNORMAL LOW (ref 22–32)
Calcium: 7.5 mg/dL — ABNORMAL LOW (ref 8.9–10.3)
Chloride: 95 mmol/L — ABNORMAL LOW (ref 98–111)
Creatinine, Ser: 5.28 mg/dL — ABNORMAL HIGH (ref 0.61–1.24)
GFR, Estimated: 10 mL/min — ABNORMAL LOW (ref >60)
Glucose, Bld: 131 mg/dL — ABNORMAL HIGH (ref 70–99)
Potassium: 4.1 mmol/L (ref 3.5–5.1)
Sodium: 127 mmol/L — ABNORMAL LOW (ref 135–145)

## 2023-01-11 LAB — GLUCOSE, CAPILLARY
Glucose-Capillary: 126 mg/dL — ABNORMAL HIGH (ref 70–99)
Glucose-Capillary: 129 mg/dL — ABNORMAL HIGH (ref 70–99)
Glucose-Capillary: 147 mg/dL — ABNORMAL HIGH (ref 70–99)
Glucose-Capillary: 158 mg/dL — ABNORMAL HIGH (ref 70–99)
Glucose-Capillary: 159 mg/dL — ABNORMAL HIGH (ref 70–99)
Glucose-Capillary: 163 mg/dL — ABNORMAL HIGH (ref 70–99)
Glucose-Capillary: 183 mg/dL — ABNORMAL HIGH (ref 70–99)

## 2023-01-11 LAB — PHOSPHORUS: Phosphorus: 6.9 mg/dL — ABNORMAL HIGH (ref 2.5–4.6)

## 2023-01-11 LAB — VANCOMYCIN, RANDOM: Vancomycin Rm: 21 ug/mL

## 2023-01-11 LAB — MAGNESIUM: Magnesium: 2.4 mg/dL (ref 1.7–2.4)

## 2023-01-11 LAB — CULTURE, BLOOD (ROUTINE X 2)

## 2023-01-11 MED ORDER — DEXMEDETOMIDINE HCL IN NACL 400 MCG/100ML IV SOLN
0.0000 ug/kg/h | INTRAVENOUS | Status: DC
  Administered 2023-01-11: 0.2 ug/kg/h via INTRAVENOUS
  Administered 2023-01-12: 0.3 ug/kg/h via INTRAVENOUS
  Filled 2023-01-11 (×2): qty 100

## 2023-01-11 MED ORDER — SENNA 8.6 MG PO TABS
1.0000 | ORAL_TABLET | Freq: Every day | ORAL | Status: DC
  Administered 2023-01-12 – 2023-01-16 (×5): 8.6 mg
  Filled 2023-01-11 (×5): qty 1

## 2023-01-11 MED ORDER — HYDROCORTISONE SOD SUC (PF) 100 MG IJ SOLR
50.0000 mg | Freq: Two times a day (BID) | INTRAMUSCULAR | Status: DC
  Administered 2023-01-11 (×2): 50 mg via INTRAVENOUS
  Filled 2023-01-11 (×2): qty 2

## 2023-01-11 MED ORDER — NOREPINEPHRINE 4 MG/250ML-% IV SOLN: 0.0000 ug/min | INTRAVENOUS | Status: DC

## 2023-01-11 NOTE — Progress Notes (Signed)
Patient ID: Sem Sorrells, male   DOB: 09-21-1945, 77 y.o.   MRN: [id number]    Progress Note from the Palliative Medicine Team at [facility]   Patient Name: Travis Beck        Date: 01/11/2023 DOB: 04-16-1946  Age: 77 y.o. MRN#: [id number] Attending Physician: Tomma Lightning, MD Primary Care Physician: Kirstie Peri, MD Admit Date: 01/06/2023    Extensive chart review has been completed prior to meeting with patient/family  including labs, vital signs, imaging, progress/consult notes, orders, medications and available advance directive documents.   77 y.o. male   admitted on 01/06/2023 with past   medical history significant of IIDM, HTN, SIDAH, COPD,  former smoker quit 1994 with a 66 pack year smoking history, diabetic neuropathy, recurrent pneumonia presented 01/06/2023 with worsening nausea /, cough and shortness of breath.    Patient was recently hospitalized 5/23-5/26 for multifocal MRSA pneumonia  and septic shock requiring vasopressor on admission-stabilized and subsequently discharged home on Augmentin/doxycycline .    Pt returned to [facility] 12/07/2022 with fever of 103.1 and ongoing left sided pleuritic pain. He has a small effusion that was not big enough to tap.  He was admitted for incompletely treated PNA-as sputum cultures finalized postdischarge, and Doxycycline did not cover MRSA. He was treated with Zyvox, and discharged home on Zyvox p.o on 12/13/2022.    Initially his breathing symptoms improved with Zyvox however patient continued to experience frequent nausea, vomiting and cough. He presented to the ED 6/29 as these symptoms were worsening.   01/06/23 swallow evaluation significant for esophageal dysphagia  01/08/23 worsening chest x-ray, significant for extensive right lung, mild left mid lung and left lower lobe interstitial airspace opacities.  Required intubation  01/10/2023-excessively extubated  01/11/2023-increasing oxygen needs now on 100%  O2, increased agitation, worsening renal function/creatinine 5.28  This NP assessed patient at the bedside as a follow up  for palliative medicine needs and emotional support.  I met in the family room with patient's sister/Marie, sisters Eber Jones and Bjorn Loser and his niece Kintzel  Continued education regarding the seriousness of patient's current medical situation and is high risk for decompensation secondary to his multiple co-morbidities; dysphagia, COPD, recurrent aspiration pneumonia, worsening renal function,  weakness and overall failure to thrive.   Values and goals of care important to patient and family were attempted to be elicited.  Detailed conversation and education regarding decisions regarding treatment options, advanced directives and anticipatory care needs. We specifically spoke to reintubation, ongoing artificial feeding and placement in long-term care facilities.  Education offered on the difference between a aggressive medical intervention path  and a palliative comfort care path for this patient,  at this time and in this situation.   All questions and concerns addressed.  Family would like to speak to Dr. Wynona Neat, contacted,  and he did meet with family to further detail current medical situation   Plan of Care: -Full code     -Educated family to consider DNR/DNI status understanding evidenced based poor outcomes in similar hospitalized patient, as the cause of arrest is likely associated with advanced chronic illness rather than an easily reversible acute cardio-pulmonary event. -Family is open to all offered and available medical interventions to prolong life. -Ongoing conversation regarding goals of care-family have an understanding that patient would not want to live long term dependent on life support of any kind  Education offered today regarding  the importance of continued conversation with family members  and  the  medical providers regarding overall plan of care  and treatment options,  ensuring decisions are within the context of the patients values and GOCs.   Patient and family will need ongoing support as they navigate healthcare decisions.  Questions and concerns addressed   Discussed with Dr Wynona Neat and bedside RN/Patrick   PMT will continue to support holistically  Time: 90  minutes  Detailed review of medical records ( labs, imaging, vital signs), medically appropriate exam ( MS, skin, resp)   discussed with treatment team, counseling and education to patient, family, staff, documenting clinical information, medication management, coordination of care    Lorinda Creed NP  Palliative Medicine Team Team Phone # [phone] Pager [phone]

## 2023-01-11 NOTE — Progress Notes (Signed)
Pharmacy ICU Bowel Regimen Consult Note   Current Inpatient Medications for Bowel Management:  Senna BID Reglan 5 IV q8h GI on board for esophageal dysmotility  Assessment: Travis Beck is a 77 y.o. year old male admitted on 01/06/2023. Constipation identified as non-opioid-induced constipation . Bowel regimen assessment completed by Luisa Hart, RN on 7/4. LBM on 7/3.   [x]  Bowel sounds present - hypoactive [x]  No abdominal tenderness  [x]  Passing gas   Plan: Decrease senna to daily at bedtime  Continue other meds   MD contacted (if needed): [place]  Thank you for allowing pharmacy to participate in this patient's care.  Calton Dach, PharmD Clinical Pharmacist 01/11/2023 9:39 AM

## 2023-01-11 NOTE — Progress Notes (Signed)
Pharmacy Antibiotic Note- follow-up  Travis Beck is a 77 y.o. male admitted on 01/06/2023 with pneumonia.  Pharmacy has been consulted for cefepime and vancomycin dosing. Of note, pt was admitted for MRSA PNA in May 2024. Received inpatient abx and was discharged on doxycycline. Unfortunately, the MRSA susceptibilities revealed resistance to doxycycline. Pt re-presented with persistent symptoms and then took linezolid 600mg  PO BID x7 days (6/5-6/11/24).   Vancomycin random 07/04 @0741  = 21 mcg/mL  Pt specific kinetics: Ke = 0.0125; 1/2 life = 55hrs Predicted 48 hour level is 11 mcg/mL  Plan: Hold Vancomycin today and recheck level 7/6 AM.  Continue Cefepime 2g IV q24h Monitor CBC, clinical picture, renal function, and cultures   Height: 5\' 5"  (165.1 cm) Weight: 72.5 kg (159 lb 13.3 oz) IBW/kg (Calculated) : 61.5  Temp (24hrs), Avg:98.2 F (36.8 C), Min:97.7 F (36.5 C), Max:98.4 F (36.9 C)  Recent Labs  Lab 01/06/23 1540 01/06/23 1758 01/06/23 2234 01/06/23 2303 01/07/23 0145 01/07/23 1005 01/08/23 0407 01/08/23 1148 01/08/23 1622 01/09/23 0135 01/10/23 0527 01/10/23 0853 01/11/23 0741  WBC  --   --   --   --  15.0*  --  14.9* 14.9* 17.2* 17.6* 10.6*  --  16.9*  CREATININE  --   --    < >  --  3.94*  --  4.19*  --  4.26* 4.43* 5.14*  --  5.28*  LATICACIDVEN 2.7* 3.7*  --  2.8* 1.3  --   --   --   --   --   --   --   --   VANCORANDOM  --   --   --   --   --    < > 28  --   --  17  --  28 21   < > = values in this interval not displayed.     Estimated Creatinine Clearance: 9.9 mL/min (A) (by C-G formula based on SCr of 5.28 mg/dL (H)).    Allergies  Allergen Reactions   Penicillins Rash    No problems with ampicillin during hospitalization    Antimicrobials this admission: Azithromycin 6/29 x1 dose Cefepime 6/29 > Vancomycin 6/29 >  Microbiology results: 7/1 MRSA neg 7/1 resp cx- re-incubated 6/29- Ucx- negative 6/29- Bcx- ngtd x4d 5/30- MRSA  pos  Thank you for allowing pharmacy to be a part of this patient's care.  Calton Dach, PharmD Clinical Pharmacist 01/11/2023 9:31 AM

## 2023-01-11 NOTE — Progress Notes (Addendum)
Informed about patient's hypotension  Start Levophed  Fluids were stopped earlier secondary to patient sounding more junky with increased work of breathing, fluid balance positive

## 2023-01-11 NOTE — TOC Progression Note (Signed)
Transition of Care [facility]) - Progression Note    Patient Details  Name: Travis Beck MRN: [id number] Date of Birth: 1945-08-05  Transition of Care [facility]) CM/SW Contact  Lorri Frederick, LCSW Phone Number: 01/11/2023, 10:41 AM  Clinical Narrative:   CSW spoke with [name], sister.  She and her friend Hubbert are Programmer, applications at [employer] this morning.  Have decided not to tour [facility] after reading reviews.  She will call back with update after the tour.    Expected Discharge Plan: Long Term Acute Care (LTAC) Barriers to Discharge: Continued Medical Work up  Expected Discharge Plan and Services   Discharge Planning Services: CM Consult Post Acute Care Choice: Long Term Acute Care (LTAC) Living arrangements for the past 2 months: Single Family Home                                       Social Determinants of Health (SDOH) Interventions SDOH Screenings   Food Insecurity: No Food Insecurity (12/08/2022)  Housing: Patient Declined (12/08/2022)  Transportation Needs: No Transportation Needs (12/08/2022)  Utilities: Not At Risk (12/08/2022)  Financial Resource Strain: Low Risk  (09/27/2022)  Physical Activity: Inactive (07/31/2022)  Tobacco Use: Medium Risk (01/06/2023)    Readmission Risk Interventions    01/10/2023    2:05 PM 01/08/2023    3:01 PM  Readmission Risk Prevention Plan  Transportation Screening Complete Complete  PCP or Specialist Appt within 3-5 Days  Complete  HRI or Home Care Consult  Complete  Social Work Consult for Recovery Care Planning/Counseling  Complete  Palliative Care Screening  Not Applicable  Medication Review Oceanographer) Complete Complete  PCP or Specialist appointment within 3-5 days of discharge Complete   HRI or Home Care Consult Complete   SW Recovery Care/Counseling Consult Complete   Palliative Care Screening Complete   Skilled Nursing Facility Not Applicable

## 2023-01-11 NOTE — Progress Notes (Addendum)
NAME:  Kamalei Vandervoort, MRN:  [id number], DOB:  02/16/1946, LOS: 5 ADMISSION DATE:  01/06/2023, CONSULTATION DATE:  01/08/2023 REFERRING MD:  Marland Mcalpine, CHIEF COMPLAINT:  Aspiration Pneumonia, nausea and vomiting   History of Present Illness:  77 y.o. male with medical history significant of IIDM, HTN, SIDAH, COPD, ( Former smoker quit 1994 with a 66 pack year smoking history diabetic neuropathy, recurrent pneumonia presented 01/06/2023 with worsening of nauseous vomiting cough and shortness of breath.  Patient was recently hospitalized 5/23-5/26 for multifocal MRSA pneumonia  and septic shock requiring vasopressor on admission-stabilized and subsequently discharged home on Augmentin/doxycycline . Pt [facility] to [facility] 12/07/2022 with fever of 103.1 and ongoing left sided pleuritic pain. He has a small effusion that was not big enough to tap.  He was admitted for incompletely treated PNA-as sputum cultures finalized postdischarge, and Doxycycline did not cover MRSA. He was treated with Zyvox, and discharged home on Zyvox p.o on 12/13/2022. Initially his breathing symptoms improved with Zyvox however patient continued to experience frequent nausea, vomiting and cough. He presented to the ED 6/29 as these symptoms were worsening.  In the ED he was mildly febrile at 99.6, He had  tachycardia, oxygen sats were 93% on 3 L. CXR again showed a multifocal pneumonia, right sided. Na 128/ Creatinine of 4.2, BUN 66 and WBC of 20. His HGB was 8.7.Respiratory Panel was negative. Lactic Acid trend since admission 2.7>>2.8>>1,3 Pulmonary was  asked to consult for recurrent vs slow to resolve pneumonia and hypoxemia . Of note , patient is followed in [facility]  Pulmonary clinic by Dr. Isaiah Serge and Rubye Oaks NP.  Per family patient has had about a 15 pound weight loss since the end of May 24 when pneumonia was initially diagnosed.  Pertinent  Medical History   Past Medical History:  Diagnosis Date    Arthritis    Asthma    Childhood   Emphysema lung (HCC)    Essential hypertension    Ischemic heart disease    Myoview 2020 indicating inferior infarct scar with mild peri-infarct ischemia - managed medically   Type 2 diabetes mellitus (HCC)      Significant Hospital Events: Including procedures, antibiotic start and stop dates in addition to other pertinent events         5/23-5/26>> hospitalization for septic shock from multilobar PNA. 5/30>>6/5>> fever 103 F at home-ongoing left-sided pleuritic chest pain- 6/29>> Recurrent multifocal  ? Aspiration  pneumonia  Interim History / Subjective:   Overnight did have soft blood pressures but did not require Levophed.  Yesterday during day, patient did become more hypoxic, and had increased oxygen requirements.  Patient was also agitated.  Patient evaluated bedside this morning.  He states that he is having some shortness of breath and cough.  He denies any abdominal pain or distention.  He states he had 2 bowel movements yesterday.  Sister at bedside who was updated.   CXR 01/08/2023>> shows extensive right lung / and mild left mid lung / left lower lobe interstitial airspace opacities>> [facility] wrote for some lasix  01/06/2023>>Swallow evaluation >> esophageal dysphagia, needs GI consult for esophageal dysphagia management. Family state patient eats very fast, and he dose not complain of cough or  getting choked.   Objective   Blood pressure 104/68, pulse 81, temperature 98.4 F (36.9 C), temperature source Oral, resp. rate 16, height 5\' 5"  (1.651 m), weight 72.5 kg, SpO2 98 %.  Temperature 97.7-98.4 F, pulse 74-98, respiration rate  13-21, satting at 95-98% on high flow nasal cannula 90/40, blood pressure MAP 68-87 off pressors    Vent Mode: CPAP;PSV FiO2 (%):  [40 %-90 %] 90 % PEEP:  [5 cmH20] 5 cmH20 Pressure Support:  [8 cmH20] 8 cmH20   Intake/Output Summary (Last 24 hours) at 01/11/2023 0723 Last data filed at 01/11/2023 0300 Gross  per 24 hour  Intake 2655.96 ml  Output 320 ml  Net 2335.96 ml   Filed Weights   01/06/23 1322 01/10/23 0455  Weight: 71.7 kg 72.5 kg    Examination: General: awake, able to follow instructions HENT:  Atraumatic, normocephalic  Lungs: Coarse breath sounds throughout, lung volumes decreased Cardiovascular: Regular rate and rhythm   Abdomen:  Soft , Flat , BS +, Body mass index is 26.6 kg/m.  Extremities:  No obvious abnormalities, loss of tissue mass Neuro: Able to follow all instructions GU: Did have urine output yesterday, but decreased  Labs reviewed 07/04 Glucose 129-195 Phosphorus 6.9 (8.0) Magnesium 2.4 Sodium 127, creatinine 5.28, potassium 4.1.  Bicarb 15 (17), CBC: White count 16.9, hemoglobin 9.6, platelet 566  Respiratory culture: Moderate yeast, reintubated Urine culture: No growth Blood culture x 4 days  Output of 320 mL yesterday No new imaging  Resolved Hospital Problem list     Assessment & Plan:   #Sepsis #Acute hypoxic respiratory failure requiring intubation, resolving #Multifocal pneumonia, recurrent aspiration pneumonia #COPD #Leukocytosis Patient evaluated bedside this morning.  Overnight did require heated high flow oxygen.  Patient currently on 90/40.  Respiratory culture growing yeast and reintubated.  Blood cultures no growth to date.  Urine culture with no growth.  Still requiring cefepime and vancomycin.  Patient mains afebrile.  On exam, patient has coarse lung sounds.  Decreased volume.  Patient has been weaned off pressors. -Continue Brovana, Pulmicort, Atrovent -Continue Xopenex/albuterol as needed -Aggressive pulmonary toilet with IS and flutter valve -Vancomycin and cefepime day 5/7 -Monitor white counts  -Monitor fever curve  -Follow cultures  -Goal O2 88-92% -Decrease steroids to 50 twice daily  #Septic shock 2/2 above Patient has been weaned off Levophed.  Blood pressure maintaining well.   -Continue steroids -Continue  fluid resuscitation -Monitor blood pressure closely  #Iron deficiency anemia Morning labs still pending.  No concern for bleeding this morning.  Patient did have bowel movement x 2 overnight.  Given concern for active infection, will not replace iron at this time. -Transfuse to keep hemoglobin greater than 7 -Monitor CBC  #AKI #Anion gap metabolic acidosis #Hyponatremia Baseline creatinine around 0.7.  Sodium down to 127.  Likely in the setting of underlying renal insufficiency.  Patient's urine output has decreased to 320 milliliters yesterday.  Creatinine this morning still pending.  Nephrology following.  Likely ATN in the setting of hypoperfusion.  There is some concern, the patient urine output has been decreasing despite 2+ liters of fluid yesterday. -Monitor BMP -Nephrology following -Continue infusion  #Esophageal Dysmotility ( per swallow eval 6/30) possibly contributing to slow to resolve pneumonia Patient still required high flow nasal cannula.  GI did state that they would consider scoping if patient is weaned down to 1 L to 2 L of oxygen.  Currently not at this.  Core track in place.  Will continue with tube feeds.  Patient did not pass his speech eval is currently NPO. -Continue feeds with cortrak  #Weight loss >> 15 pounds in the last month Protein supplements dietitian following.  Started trickle feeds  Best Practice (right click and "Reselect all  SmartList Selections" daily)   Diet/type: trickle feeds  DVT prophylaxis: Heparin GI prophylaxis: Famotidine Lines: 3 peripheral IVs, Foley urinary catheter, Cortrak Foley:  N/A Code Status:  full code Last date of multidisciplinary goals of care discussion (sister updated at bedside)  Labs   CBC: Recent Labs  Lab 01/06/23 1332 01/07/23 0145 01/08/23 0407 01/08/23 1148 01/08/23 1622 01/08/23 1702 01/08/23 2132 01/09/23 0009 01/09/23 0135 01/10/23 0527  WBC 20.2*   < > 14.9* 14.9* 17.2*  --   --   --  17.6*  10.6*  NEUTROABS 18.1*  --  11.6*  --  14.4*  --   --   --   --   --   HGB 8.7*   < > 6.4* 7.9* 10.0* 9.9* 9.9* 8.8* 7.8* 8.5*  HCT 26.6*   < > 19.8* 24.0* 29.6* 29.0* 29.0* 26.0* 23.3* 24.6*  MCV 88.1   < > 86.8 87.6 83.1  --   --   --  85.7 84.5  PLT 587*   < > 649* 557* 611*  --   --   --  474* 531*   < > = values in this interval not displayed.    Basic Metabolic Panel: Recent Labs  Lab 01/07/23 0145 01/07/23 0145 01/08/23 0407 01/08/23 1622 01/08/23 1702 01/08/23 2132 01/09/23 0009 01/09/23 0135 01/09/23 1800 01/10/23 0527 01/10/23 1938  NA 126*  --  129* 126* 126* 127* 128* 130*  --  129*  --   K 3.8  --  3.8 3.9 4.0 4.0 4.1 3.8  --  4.2  --   CL 98  --  95* 96*  --   --   --  94*  --  94*  --   CO2 17*  --  19* 17*  --   --   --  19*  --  17*  --   GLUCOSE 76  --  70 156*  --   --   --  180*  --  281*  --   BUN 63*  --  65* 66*  --   --   --  68*  --  84*  --   CREATININE 3.94*  --  4.19* 4.26*  --   --   --  4.43*  --  5.14*  --   CALCIUM 7.1*  --  7.8* 7.8*  --   --   --  7.4*  --  7.5*  --   MG  --    < > 1.7 2.3  --   --   --  2.1 2.2 2.4 2.4  PHOS  --   --  4.0 5.2*  --   --   --   --  7.4* 8.0* 6.9*   < > = values in this interval not displayed.   GFR: Estimated Creatinine Clearance: 10.1 mL/min (A) (by C-G formula based on SCr of 5.14 mg/dL (H)). Recent Labs  Lab 01/06/23 1540 01/06/23 1758 01/06/23 2303 01/07/23 0145 01/08/23 0407 01/08/23 1148 01/08/23 1622 01/09/23 0135 01/10/23 0527  WBC  --   --   --  15.0*   < > 14.9* 17.2* 17.6* 10.6*  LATICACIDVEN 2.7* 3.7* 2.8* 1.3  --   --   --   --   --    < > = values in this interval not displayed.    Liver Function Tests: Recent Labs  Lab 01/08/23 0407 01/08/23 1622  AST 43* 35  ALT 16 15  ALKPHOS  79 87  BILITOT 0.4 0.5  PROT 6.0* 6.0*  ALBUMIN <1.5* 1.5*   No results for input(s): "LIPASE", "AMYLASE" in the last 168 hours. No results for input(s): "AMMONIA" in the last 168 hours.  ABG     Component Value Date/Time   PHART 7.213 (L) 01/09/2023 0009   PCO2ART 52.9 (H) 01/09/2023 0009   PO2ART 170 (H) 01/09/2023 0009   HCO3 17.9 (L) 01/10/2023 0732   TCO2 23 01/09/2023 0009   ACIDBASEDEF 7.1 (H) 01/10/2023 0732   O2SAT 93.2 01/10/2023 0732     Coagulation Profile: Recent Labs  Lab 01/06/23 1540  INR 1.2    Cardiac Enzymes: No results for input(s): "CKTOTAL", "CKMB", "CKMBINDEX", "TROPONINI" in the last 168 hours.  HbA1C: Hgb A1c MFr Bld  Date/Time Value Ref Range Status  12/01/2022 05:41 AM 6.8 (H) 4.8 - 5.6 % Final    Comment:    (NOTE)         Prediabetes: 5.7 - 6.4         Diabetes: >6.4         Glycemic control for adults with diabetes: <7.0     CBG: Recent Labs  Lab 01/10/23 1931 01/10/23 2139 01/11/23 0001 01/11/23 0331 01/11/23 0706  GLUCAP 195* 188* 158* 129* 126*    Review of Systems:   Shortness of breath with exertion and nausea, which is better than when admitted  Past Medical History:  He,  has a past medical history of Arthritis, Asthma, Emphysema lung (HCC), Essential hypertension, Ischemic heart disease, and Type 2 diabetes mellitus (HCC).   Surgical History:   Past Surgical History:  Procedure Laterality Date   BIOPSY  08/30/2018   Procedure: BIOPSY;  Surgeon: Malissa Hippo, MD;  [facility]: [facility];  Service: Endoscopy;;  gastric    BIOPSY  11/05/2020   Procedure: BIOPSY;  Surgeon: Dolores Frame, MD;  [facility]: [facility];  Service: Gastroenterology;;   COLONOSCOPY     COLONOSCOPY WITH PROPOFOL N/A 11/05/2020   Procedure: COLONOSCOPY WITH PROPOFOL;  Surgeon: Dolores Frame, MD;  [facility]: [facility];  Service: Gastroenterology;  Laterality: N/A;  Am   ESOPHAGEAL DILATION N/A 05/21/2019   Procedure: ESOPHAGEAL DILATION;  Surgeon: Malissa Hippo, MD;  Location: [facility];  Service: Endoscopy;  Laterality: N/A;   ESOPHAGOGASTRODUODENOSCOPY N/A 05/21/2019   Procedure:  ESOPHAGOGASTRODUODENOSCOPY (EGD);  Surgeon: Malissa Hippo, MD;  [facility]: [facility];  Service: Endoscopy;  Laterality: N/A;  730   ESOPHAGOGASTRODUODENOSCOPY (EGD) WITH PROPOFOL N/A 08/30/2018   Procedure: ESOPHAGOGASTRODUODENOSCOPY (EGD) WITH PROPOFOL;  Surgeon: Malissa Hippo, MD;  [facility]: [facility];  Service: Endoscopy;  Laterality: N/A;  1:30   ESOPHAGOGASTRODUODENOSCOPY (EGD) WITH PROPOFOL N/A 11/05/2020   Procedure: ESOPHAGOGASTRODUODENOSCOPY (EGD) WITH PROPOFOL;  Surgeon: Dolores Frame, MD;  [facility];  Service: Gastroenterology;  Laterality: N/A;   HERNIA REPAIR     bilateral lower abdomen   POLYPECTOMY  11/05/2020   Procedure: POLYPECTOMY;  Surgeon: Dolores Frame, MD;  [facility]: [facility];  Service: Gastroenterology;;   Gaspar Bidding DILATION  11/05/2020   Procedure: Gaspar Bidding DILATION;  Surgeon: Marguerita Merles, Reuel Boom, MD;  Location: [facility];  Service: Gastroenterology;;     Social History:   reports that he quit smoking about 29 years ago. His smoking use included cigarettes. He has a 66.00 pack-year smoking history. He has never used smokeless tobacco. He reports that he does not currently use alcohol. He reports that he  does not use drugs.   Family History:  His family history is not on file.   Allergies Allergies  Allergen Reactions   Penicillins Rash    No problems with ampicillin during hospitalization     Home Medications  Prior to Admission medications   Medication Sig Start Date End Date Taking? Authorizing Provider  acetaminophen (TYLENOL) 325 MG tablet Take 2 tablets (650 mg total) by mouth every 6 (six) hours as needed for mild pain (or Fever >/= 101). 11/26/19  Yes Emokpae, Courage, MD  albuterol (VENTOLIN HFA) 108 (90 Base) MCG/ACT inhaler Inhale 2 puffs into the lungs every 4 (four) hours as needed for wheezing or shortness of breath. Patient taking differently: Inhale 1 puff into the lungs every 4 (four) hours  as needed for wheezing or shortness of breath. 11/26/19  Yes Emokpae, Courage, MD  ALPRAZolam Prudy Feeler) 1 MG tablet Take 1 tablet (1 mg total) by mouth 3 (three) times daily as needed for anxiety. Patient taking differently: Take 1-2 mg by mouth at bedtime as needed for anxiety or sleep. 01/28/19  Yes Welborn, Ryan, DO  amLODipine (NORVASC) 10 MG tablet Take 10 mg by mouth at bedtime.   Yes [provider]  Budeson-Glycopyrrol-Formoterol (BREZTRI AEROSPHERE) 160-9-4.8 MCG/ACT AERO Inhale 2 puffs into the lungs in the morning and at bedtime. 12/13/22  Yes Leroy Sea, MD  buprenorphine (SUBUTEX) 8 MG SUBL SL tablet Place 4 mg under the tongue as needed (pain). 01/04/19  Yes [provider]  cetirizine (ZYRTEC) 10 MG tablet Take 10 mg by mouth at bedtime.   Yes [provider]  famotidine (PEPCID) 20 MG tablet Take 1 tablet (20 mg total) by mouth 2 (two) times daily for 7 days. Take one tablet twice daily for two days Patient taking differently: Take 20 mg by mouth as needed for heartburn or indigestion. 12/30/22 01/06/23 Yes Gerhard Munch, MD  fluticasone [facility]) 50 MCG/ACT nasal spray Place 1 spray into both nostrils daily. 08/22/18  Yes [provider]  gabapentin (NEURONTIN) 400 MG capsule Take 1 capsule (400 mg total) by mouth every 8 (eight) hours as needed. Patient taking differently: Take 400-800 mg by mouth as needed (pain). 01/28/19  Yes Welborn, Ryan, DO  glipiZIDE (GLUCOTROL XL) 10 MG 24 hr tablet Take 10 mg by mouth daily. 11/06/22  Yes [provider]  guaiFENesin (MUCINEX) 600 MG 12 hr tablet Take 1 tablet (600 mg total) by mouth 2 (two) times daily as needed for cough. Patient taking differently: Take 600 mg by mouth 2 (two) times daily as needed for cough or to loosen phlegm. 12/13/22  Yes Leroy Sea, MD  metFORMIN (GLUCOPHAGE) 1000 MG tablet Take 1,000 mg by mouth 2 (two) times daily with a meal.  12/07/14  Yes [provider]   montelukast (SINGULAIR) 10 MG tablet Take 1 tablet (10 mg total) by mouth at bedtime. 06/15/22  Yes Mannam, Praveen, MD  ondansetron (ZOFRAN-ODT) 4 MG disintegrating tablet Take 1 tablet (4 mg total) by mouth every 8 (eight) hours as needed for nausea or vomiting. Consider taking 30 minutes prior to meals. Patient taking differently: Take 4 mg by mouth as needed for nausea or vomiting. 12/30/22  Yes Gerhard Munch, MD  pantoprazole (PROTONIX) 40 MG tablet Take 1 tablet (40 mg total) by mouth daily. 12/15/20  Yes Dolores Frame, MD  pramipexole (MIRAPEX) 0.5 MG tablet Take 0.5 mg by mouth at bedtime. 11/04/22  Yes [provider]  pravastatin (PRAVACHOL) 20  MG tablet Take 20 mg by mouth every evening.   Yes [provider]  sucralfate (CARAFATE) 1 g tablet Take 1 tablet (1 g total) by mouth 4 (four) times daily -  with meals and at bedtime. 12/30/22  Yes Gerhard Munch, MD  valsartan (DIOVAN) 160 MG tablet Take 160 mg by mouth daily. 11/06/22  Yes [provider]  aspirin EC 81 MG tablet Take 1 tablet (81 mg total) by mouth daily. Swallow whole. Patient not taking: Reported on 01/06/2023 09/30/20   Jonelle Sidle, MD  linezolid (ZYVOX) 600 MG tablet Take 1 tablet (600 mg total) by mouth every 12 (twelve) hours. Patient not taking: Reported on 01/06/2023 12/13/22   Leroy Sea, MD        Modena Slater, DO  Internal Medicine Resident PGY-2 01/11/2023 7:23 AM

## 2023-01-11 NOTE — Progress Notes (Signed)
Patient ID: Travis Beck, male   DOB: 23-Jan-1946, 77 y.o.   MRN: [id number] S: Pt became hypotensive overnight and restarted on levophed.  Weaned off this am but UOP has dropped significantly. O:BP 126/73 (BP Location: Left Arm)   Pulse 92   Temp 98.4 F (36.9 C) (Oral)   Resp (!) 23   Ht 5\' 5"  (1.651 m)   Wt 72.5 kg   SpO2 93%   BMI 26.60 kg/m   Intake/Output Summary (Last 24 hours) at 01/11/2023 0917 Last data filed at 01/11/2023 0800 Gross per 24 hour  Intake 3135.66 ml  Output 420 ml  Net 2715.66 ml   Intake/Output: I/O last 3 completed shifts: In: 3858.8 [I.V.:2328.6; NG/GT:740; IV Piggyback:790.2] Out: 495 [Urine:495]  Intake/Output this shift:  Total I/O In: 178.8 [I.V.:178.8] Out: 120 [Urine:120] Weight change:  Gen: NAD CVS: RRR Resp:Scattered rhonchi bilaterally Abd: +BS, soft, NT/ND Ext: no edema  Recent Labs  Lab 01/06/23 2234 01/07/23 0145 01/08/23 0407 01/08/23 1622 01/08/23 1702 01/08/23 2132 01/09/23 0009 01/09/23 0135 01/09/23 1800 01/10/23 0527 01/10/23 1938 01/11/23 0741  NA 128* 126* 129* 126* 126* 127* 128* 130*  --  129*  --  127*  K 3.8 3.8 3.8 3.9 4.0 4.0 4.1 3.8  --  4.2  --  4.1  CL 94* 98 95* 96*  --   --   --  94*  --  94*  --  95*  CO2 18* 17* 19* 17*  --   --   --  19*  --  17*  --  15*  GLUCOSE 130* 76 70 156*  --   --   --  180*  --  281*  --  131*  BUN 65* 63* 65* 66*  --   --   --  68*  --  84*  --  93*  CREATININE 3.90* 3.94* 4.19* 4.26*  --   --   --  4.43*  --  5.14*  --  5.28*  ALBUMIN  --   --  <1.5* 1.5*  --   --   --   --   --   --   --   --   CALCIUM 7.7* 7.1* 7.8* 7.8*  --   --   --  7.4*  --  7.5*  --  7.5*  PHOS  --   --  4.0 5.2*  --   --   --   --  7.4* 8.0* 6.9* 6.9*  AST  --   --  43* 35  --   --   --   --   --   --   --   --   ALT  --   --  16 15  --   --   --   --   --   --   --   --    Liver Function Tests: Recent Labs  Lab 01/08/23 0407 01/08/23 1622  AST 43* 35  ALT 16 15  ALKPHOS 79 87   BILITOT 0.4 0.5  PROT 6.0* 6.0*  ALBUMIN <1.5* 1.5*   No results for input(s): "LIPASE", "AMYLASE" in the last 168 hours. No results for input(s): "AMMONIA" in the last 168 hours. CBC: Recent Labs  Lab 01/06/23 1332 01/07/23 0145 01/08/23 0407 01/08/23 1148 01/08/23 1622 01/08/23 1702 01/09/23 0135 01/10/23 0527 01/11/23 0741  WBC 20.2*   < > 14.9* 14.9* 17.2*  --  17.6* 10.6* 16.9*  NEUTROABS 18.1*  --  11.6*  --  14.4*  --   --   --   --   HGB 8.7*   < > 6.4* 7.9* 10.0*   < > 7.8* 8.5* 9.6*  HCT 26.6*   < > 19.8* 24.0* 29.6*   < > 23.3* 24.6* 28.3*  MCV 88.1   < > 86.8 87.6 83.1  --  85.7 84.5 84.2  PLT 587*   < > 649* 557* 611*  --  474* 531* 566*   < > = values in this interval not displayed.   Cardiac Enzymes: No results for input(s): "CKTOTAL", "CKMB", "CKMBINDEX", "TROPONINI" in the last 168 hours. CBG: Recent Labs  Lab 01/10/23 1931 01/10/23 2139 01/11/23 0001 01/11/23 0331 01/11/23 0706  GLUCAP 195* 188* 158* 129* 126*    Iron Studies: No results for input(s): "IRON", "TIBC", "TRANSFERRIN", "FERRITIN" in the last 72 hours. Studies/Results: No results found.  arformoterol  15 mcg Nebulization BID   aspirin  81 mg Per Tube Daily   budesonide (PULMICORT) nebulizer solution  0.5 mg Nebulization BID   Chlorhexidine Gluconate Cloth  6 each Topical Daily   famotidine  10 mg Per Tube Daily   feeding supplement (PROSource TF20)  60 mL Per Tube Daily   fluticasone  2 spray Each Nare Daily   guaiFENesin  15 mL Per Tube Q4H   heparin  5,000 Units Subcutaneous Q8H   hydrocortisone sod succinate (SOLU-CORTEF) inj  100 mg Intravenous Q12H   insulin aspart  0-9 Units Subcutaneous Q4H   loratadine  10 mg Per Tube Daily   metoCLOPramide (REGLAN) injection  5 mg Intravenous Q8H   montelukast  10 mg Per Tube QHS   pantoprazole (PROTONIX) IV  40 mg Intravenous Q12H   pramipexole  0.5 mg Per Tube QHS   pravastatin  20 mg Per Tube QPM   revefenacin  175 mcg Nebulization  Daily   saccharomyces boulardii  250 mg Per Tube BID   senna  1 tablet Per Tube BID   thiamine  100 mg Per Tube Daily   vancomycin variable dose per unstable renal function (pharmacist dosing)   Does not apply See admin instructions    BMET    Component Value Date/Time   NA 127 (L) 01/11/2023 0741   K 4.1 01/11/2023 0741   CL 95 (L) 01/11/2023 0741   CO2 15 (L) 01/11/2023 0741   GLUCOSE 131 (H) 01/11/2023 0741   BUN 93 (H) 01/11/2023 0741   CREATININE 5.28 (H) 01/11/2023 0741   CALCIUM 7.5 (L) 01/11/2023 0741   GFRNONAA 10 (L) 01/11/2023 0741   GFRAA >60 12/15/2019 0812   CBC    Component Value Date/Time   WBC 16.9 (H) 01/11/2023 0741   RBC 3.36 (L) 01/11/2023 0741   HGB 9.6 (L) 01/11/2023 0741   HCT 28.3 (L) 01/11/2023 0741   PLT 566 (H) 01/11/2023 0741   MCV 84.2 01/11/2023 0741   MCH 28.6 01/11/2023 0741   MCHC 33.9 01/11/2023 0741   RDW 15.1 01/11/2023 0741   LYMPHSABS 1.5 01/08/2023 1622   MONOABS 0.9 01/08/2023 1622   EOSABS 0.2 01/08/2023 1622   BASOSABS 0.1 01/08/2023 1622    Assessment/Plan:  AKI - presumably ischemic ATN in setting of sepsis due to pneumonia and poor po intake. Vanco trough was high at 28.  BUN/Cr up slightly today and dropped UOP overnight due to hypotension.  If no significant improvement will likely need to start CRRT tomorrow.  No urgent indication for dialysis at  this time.  Will continue to follow closely.  Hopefully his renal function will start to improve now that he is off of pressors.  Agree with IVF's. Avoid nephrotoxic medications including NSAIDs and iodinated intravenous contrast exposure unless the latter is absolutely indicated.  Preferred narcotic agents for pain control are hydromorphone, fentanyl, and methadone. Morphine should not be used. Avoid Baclofen and avoid oral sodium phosphate and magnesium citrate based laxatives / bowel preps. Continue strict Input and Output monitoring. Will monitor the patient closely with you and  intervene or adjust therapy as indicated by changes in clinical status/labs   Sepsis - due to recurrent multifocal pneumonia - possible aspiration.  Currently off of norepinephrine.   Acute hypoxic respiratory failure - required intubation now extubated.  Plan per PCCM    Iron deficiency anemia - continue to follow SIADH - stable serum sodium levels. AGMA - due to #1.  Continue to follow Severe protein malnutrition - alb 1.5.  cortrak and supplements per PCCM. Esophageal dysmotility - speech path following.   Irena Cords, MD [facility]

## 2023-01-11 NOTE — Progress Notes (Signed)
SLP Cancellation Note  Patient Details Name: Travis Beck MRN: [id number] DOB: 1946/03/03   Cancelled treatment:       Reason Eval/Treat Not Completed: Medical issues which prohibited therapy.  Patient with declining respiratory status, currently on HFNC at 30L. Patient not safe for any PO's at this time. SLP will follow.   Angela Nevin, MA, CCC-SLP Speech Therapy

## 2023-01-11 NOTE — Progress Notes (Signed)
  Interdisciplinary Goals of Care Family Meeting   Date carried out: 01/11/2023  Location of the meeting: Conference room  Member's involved: Physician, Bedside Registered Nurse, Family Member or next of kin, and Palliative care team member, medical resident.  Durable Power of Attorney or acting medical decision maker: Patient's Sister Hilda Lias  Discussion: We discussed goals of care for [name] .   Ongoing respiratory failure, multilobar pneumonia, acute kidney injury Progressive increased need for oxygen supplementation, ongoing severe anxiety Need for mechanical ventilation and the likelihood of being able to wean him off has underlying process has been difficult to fully treat, continues to worsen despite aggressive care.  Complicated by worsening renal dysfunction which may have progressed to requiring dialysis  Family desires that patient be offered a ventilator if the need arises and will decisions as his course of illness dictates  Code status:   Code Status: Full Code   Disposition: Continue current acute care  Time spent for the meeting: 32    Seylah Wernert Renne Musca, MD  01/11/2023, 1:57 PM

## 2023-01-12 ENCOUNTER — Inpatient Hospital Stay (HOSPITAL_COMMUNITY): Payer: Medicare Other

## 2023-01-12 ENCOUNTER — Telehealth: Payer: Self-pay | Admitting: Pulmonary Disease

## 2023-01-12 DIAGNOSIS — J189 Pneumonia, unspecified organism: Secondary | ICD-10-CM | POA: Diagnosis not present

## 2023-01-12 DIAGNOSIS — N179 Acute kidney failure, unspecified: Secondary | ICD-10-CM | POA: Diagnosis not present

## 2023-01-12 DIAGNOSIS — J9601 Acute respiratory failure with hypoxia: Secondary | ICD-10-CM | POA: Diagnosis not present

## 2023-01-12 LAB — RENAL FUNCTION PANEL
Albumin: <1.5 g/dL — ABNORMAL LOW (ref 3.5–5.0)
Albumin: <1.5 g/dL — ABNORMAL LOW (ref 3.5–5.0)
Anion gap: 17 — ABNORMAL HIGH (ref 5–15)
Anion gap: 15 (ref 5–15)
BUN: 102 mg/dL — ABNORMAL HIGH (ref 8–23)
BUN: 112 mg/dL — ABNORMAL HIGH (ref 8–23)
CO2: 16 mmol/L — ABNORMAL LOW (ref 22–32)
CO2: 16 mmol/L — ABNORMAL LOW (ref 22–32)
Calcium: 7.4 mg/dL — ABNORMAL LOW (ref 8.9–10.3)
Calcium: 7.6 mg/dL — ABNORMAL LOW (ref 8.9–10.3)
Chloride: 95 mmol/L — ABNORMAL LOW (ref 98–111)
Chloride: 98 mmol/L (ref 98–111)
Creatinine, Ser: 5.37 mg/dL — ABNORMAL HIGH (ref 0.61–1.24)
Creatinine, Ser: 5.39 mg/dL — ABNORMAL HIGH (ref 0.61–1.24)
GFR, Estimated: 10 mL/min — ABNORMAL LOW (ref >60)
GFR, Estimated: 10 mL/min — ABNORMAL LOW (ref >60)
Glucose, Bld: 187 mg/dL — ABNORMAL HIGH (ref 70–99)
Glucose, Bld: 156 mg/dL — ABNORMAL HIGH (ref 70–99)
Phosphorus: 6.7 mg/dL — ABNORMAL HIGH (ref 2.5–4.6)
Phosphorus: 7.1 mg/dL — ABNORMAL HIGH (ref 2.5–4.6)
Potassium: 4.1 mmol/L (ref 3.5–5.1)
Potassium: 4.1 mmol/L (ref 3.5–5.1)
Sodium: 128 mmol/L — ABNORMAL LOW (ref 135–145)
Sodium: 129 mmol/L — ABNORMAL LOW (ref 135–145)

## 2023-01-12 LAB — CBC WITH DIFFERENTIAL/PLATELET
Abs Immature Granulocytes: 0.23 10*3/uL — ABNORMAL HIGH (ref 0.00–0.07)
Basophils Absolute: 0 10*3/uL (ref 0.0–0.1)
Basophils Relative: 0 %
Eosinophils Absolute: 0 10*3/uL (ref 0.0–0.5)
Eosinophils Relative: 0 %
HCT: 24.5 % — ABNORMAL LOW (ref 39.0–52.0)
Hemoglobin: 8.2 g/dL — ABNORMAL LOW (ref 13.0–17.0)
Immature Granulocytes: 1 %
Lymphocytes Relative: 3 %
Lymphs Abs: 0.5 10*3/uL — ABNORMAL LOW (ref 0.7–4.0)
MCH: 28.1 pg (ref 26.0–34.0)
MCHC: 33.5 g/dL (ref 30.0–36.0)
MCV: 83.9 fL (ref 80.0–100.0)
Monocytes Absolute: 0.5 10*3/uL (ref 0.1–1.0)
Monocytes Relative: 3 %
Neutro Abs: 18.4 10*3/uL — ABNORMAL HIGH (ref 1.7–7.7)
Neutrophils Relative %: 93 %
Platelets: 713 10*3/uL — ABNORMAL HIGH (ref 150–400)
RBC: 2.92 MIL/uL — ABNORMAL LOW (ref 4.22–5.81)
RDW: 15.1 % (ref 11.5–15.5)
WBC: 19.7 10*3/uL — ABNORMAL HIGH (ref 4.0–10.5)
nRBC: 0 % (ref 0.0–0.2)

## 2023-01-12 LAB — GLUCOSE, CAPILLARY
Glucose-Capillary: 170 mg/dL — ABNORMAL HIGH (ref 70–99)
Glucose-Capillary: 177 mg/dL — ABNORMAL HIGH (ref 70–99)
Glucose-Capillary: 180 mg/dL — ABNORMAL HIGH (ref 70–99)
Glucose-Capillary: 170 mg/dL — ABNORMAL HIGH (ref 70–99)

## 2023-01-12 MED ORDER — HEPARIN (PORCINE) 2000 UNITS/L FOR CRRT
INTRAVENOUS_CENTRAL | Status: DC | PRN
  Administered 2023-01-12 – 2023-01-14 (×2): 2000 mL via INTRAVENOUS_CENTRAL

## 2023-01-12 MED ORDER — RENA-VITE PO TABS
1.0000 | ORAL_TABLET | Freq: Every day | ORAL | Status: DC
  Administered 2023-01-12 – 2023-01-24 (×13): 1
  Filled 2023-01-12 (×14): qty 1

## 2023-01-12 MED ORDER — PROSOURCE TF20 ENFIT COMPATIBL EN LIQD
60.0000 mL | Freq: Two times a day (BID) | ENTERAL | Status: DC
  Administered 2023-01-12 – 2023-01-15 (×6): 60 mL
  Filled 2023-01-12 (×6): qty 60

## 2023-01-12 MED ORDER — LORATADINE 10 MG PO TABS
10.0000 mg | ORAL_TABLET | ORAL | Status: DC
  Administered 2023-01-14 – 2023-01-22 (×6): 10 mg
  Filled 2023-01-12 (×6): qty 1

## 2023-01-12 MED ORDER — OSMOLITE 1.5 CAL PO LIQD
1000.0000 mL | ORAL | Status: DC
  Administered 2023-01-12 – 2023-01-15 (×4): 1000 mL
  Filled 2023-01-12 (×5): qty 1000

## 2023-01-12 MED ORDER — ALPRAZOLAM 0.5 MG PO TABS
1.0000 mg | ORAL_TABLET | Freq: Three times a day (TID) | ORAL | Status: DC | PRN
  Filled 2023-01-12: qty 2

## 2023-01-12 MED ORDER — PRISMASOL BGK 4/2.5 32-4-2.5 MEQ/L REPLACEMENT SOLN
Status: DC
  Filled 2023-01-12 (×9): qty 5000

## 2023-01-12 MED ORDER — PANTOPRAZOLE SODIUM 40 MG IV SOLR
40.0000 mg | Freq: Every day | INTRAVENOUS | Status: DC
  Administered 2023-01-13 – 2023-01-24 (×12): 40 mg via INTRAVENOUS
  Filled 2023-01-12 (×11): qty 10

## 2023-01-12 MED ORDER — LIDOCAINE 5 % EX PTCH
1.0000 | MEDICATED_PATCH | CUTANEOUS | Status: DC
  Administered 2023-01-12 – 2023-01-22 (×9): 1 via TRANSDERMAL
  Filled 2023-01-12 (×12): qty 1

## 2023-01-12 MED ORDER — ALPRAZOLAM 0.5 MG PO TABS
0.5000 mg | ORAL_TABLET | Freq: Four times a day (QID) | ORAL | Status: DC | PRN
  Administered 2023-01-12 – 2023-01-23 (×25): 0.5 mg
  Filled 2023-01-12 (×26): qty 1

## 2023-01-12 MED ORDER — PRISMASOL BGK 4/2.5 32-4-2.5 MEQ/L EC SOLN
Status: DC
  Filled 2023-01-12 (×32): qty 5000

## 2023-01-12 MED ORDER — HEPARIN SODIUM (PORCINE) 1000 UNIT/ML DIALYSIS
1000.0000 [IU] | INTRAMUSCULAR | Status: DC | PRN
  Administered 2023-01-16: 2400 [IU] via INTRAVENOUS_CENTRAL
  Filled 2023-01-12: qty 6
  Filled 2023-01-12: qty 3

## 2023-01-12 NOTE — Telephone Encounter (Signed)
Pt is in hospital and the family has a few questions they would like to ask

## 2023-01-12 NOTE — Procedures (Signed)
Central Venous Catheter Insertion Procedure Note  Elijan Bault  [id number]  04/04/1946  Date:01/12/23  Time:11:56 AM   Provider Performing:Nicey Krah   Procedure: Insertion of Non-tunneled Central Venous Catheter(36556)with US guidance (11914)    Indication(s) Hemodialysis  Consent Risks of the procedure as well as the alternatives and risks of each were explained to the patient and/or caregiver.  Consent for the procedure was obtained and is signed in the bedside chart  Anesthesia Topical only with 1% lidocaine   Timeout Verified patient identification, verified procedure, site/side was marked, verified correct patient position, special equipment/implants available, medications/allergies/relevant history reviewed, required imaging and test results available.  Sterile Technique Maximal sterile technique including full sterile barrier drape, hand hygiene, sterile gown, sterile gloves, mask, hair covering, sterile ultrasound probe cover (if used).  Procedure Description Area of catheter insertion was cleaned with chlorhexidine and draped in sterile fashion.   With real-time ultrasound guidance a HD catheter was placed into the right internal jugular vein.  Nonpulsatile blood flow and easy flushing noted in all ports.  The catheter was sutured in place and sterile dressing applied.  Complications/Tolerance None; patient tolerated the procedure well. Chest X-ray is ordered to verify placement for internal jugular or subclavian cannulation.  Chest x-ray is not ordered for femoral cannulation.  EBL Minimal  Specimen(s) None

## 2023-01-12 NOTE — Progress Notes (Signed)
NAME:  Travis Beck, MRN:  [id number], DOB:  [birth year], LOS: 6 ADMISSION DATE:  01/06/2023, CONSULTATION DATE:  01/08/2023 REFERRING MD:  Marland Mcalpine, CHIEF COMPLAINT:  Aspiration Pneumonia, nausea and vomiting   History of Present Illness:  77 y.o. male with medical history significant of IIDM, HTN, SIDAH, COPD, ( Former smoker quit 1994 with a 66 pack year smoking history diabetic neuropathy, recurrent pneumonia presented 01/06/2023 with worsening of nauseous vomiting cough and shortness of breath.  Patient was recently hospitalized 5/23-5/26 for multifocal MRSA pneumonia  and septic shock requiring vasopressor on admission-stabilized and subsequently discharged home on Augmentin/doxycycline . Pt returned to [facility] 12/07/2022 with fever of 103.1 and ongoing left sided pleuritic pain. He has a small effusion that was not big enough to tap.  He was admitted for incompletely treated PNA-as sputum cultures finalized postdischarge, and Doxycycline did not cover MRSA. He was treated with Zyvox, and discharged home on Zyvox p.o on 12/13/2022. Initially his breathing symptoms improved with Zyvox however patient continued to experience frequent nausea, vomiting and cough. He presented to the ED 6/29 as these symptoms were worsening.  In the ED he was mildly febrile at 99.6, He had  tachycardia, oxygen sats were 93% on 3 L. CXR again showed a multifocal pneumonia, right sided. Na 128/ Creatinine of 4.2, BUN 66 and WBC of 20. His HGB was 8.7.Respiratory Panel was negative. Lactic Acid trend since admission 2.7>>2.8>>1,3 Pulmonary was  asked to consult for recurrent vs slow to resolve pneumonia and hypoxemia . Of note , patient is followed in the [facility]  Pulmonary clinic by Dr. Isaiah Serge and Rubye Oaks NP.  Per family patient has had about a 15 pound weight loss since the end of May 24 when pneumonia was initially diagnosed.  Pertinent  Medical History   Past Medical History:  Diagnosis Date    Arthritis    Asthma    Childhood   Emphysema lung (HCC)    Essential hypertension    Ischemic heart disease    Myoview 2020 indicating inferior infarct scar with mild peri-infarct ischemia - managed medically   Type 2 diabetes mellitus (HCC)      Significant Hospital Events: Including procedures, antibiotic start and stop dates in addition to other pertinent events         5/23-5/26>> hospitalization for septic shock from multilobar PNA. 5/30>>6/5>> fever 103 F at home-ongoing left-sided pleuritic chest pain- 6/29>> Recurrent multifocal  ? Aspiration  pneumonia  Interim History / Subjective:   Overnight, patient did have to restart Levophed, but has been weaned off since.  Also, patient had more crackles noted to bilateral lungs, and fluids were turned off.  Patient evaluated bedside this morning.  He denies any shortness of breath.  He does report that he does not want to suffer this morning.  He asked what his chances are, and explained this to him.  I did tell him I was very worried about his lungs and his kidneys.  Did have conversation, patient did want to remain full code at this time.  He would like to speak to his daughter.  Objective   Blood pressure 121/69, pulse 77, temperature 97.7 F (36.5 C), temperature source Axillary, resp. rate 16, height 5\' 5"  (1.651 m), weight 73.9 kg, SpO2 95 %.  Temperature 96.3-98.4, pulse 63-111, currently 77, respiration, 13-35, blood pressure with MAP 81-87 off pressors, satting at 97-99% on high flow nasal cannula currently at 90/60.    FiO2 (%):  [75 %-  100 %] 90 %   Intake/Output Summary (Last 24 hours) at 01/12/2023 0700 Last data filed at 01/12/2023 0400 Gross per 24 hour  Intake 1568.52 ml  Output 710 ml  Net 858.52 ml   Filed Weights   01/06/23 1322 01/10/23 0455 01/11/23 0800  Weight: 71.7 kg 72.5 kg 73.9 kg    Examination: General: awake, able to follow instructions, interactive HENT:  Atraumatic, normocephalic  Lungs:  Decreased lung sounds throughout, some expiratory wheezing appreciated, unlabored breathing on high flow nasal cannula Cardiovascular: Regular rate and rhythm   Abdomen: Soft, nontender, normal active bowel sounds Extremities: Trace edema appreciated bilaterally lower extremities Neuro: Able to follow all instructions  Labs reviewed 07/04 Glucose: 159-183 RFP sodium 128, potassium 4.1, bicarb 16, BUN 102, creatinine 5.37 (5.28) CBC: White count 19.7, hemoglobin 8.2, platelet 713 Glucose 129-195  Respiratory culture: Moderate Candida albicans Urine culture: No growth Blood culture x 4 days  Urine output of 710 mL  Chest x-ray 01/11/2023 showing bilateral diffuse interstitial and alveolar airspace opacity with more focal airspace disease in right upper lobe concerning for multilobar pneumonia.  Resolved Hospital Problem list     Assessment & Plan:   #Sepsis #Acute hypoxic respiratory failure requiring intubation, resolving #Multifocal pneumonia, recurrent aspiration pneumonia #COPD #Leukocytosis Patient evaluated bedside this morning.  Still on heated high flow oxygen.  Patient is awake and alert and oriented x 4.  On exam, patient has very decreased lung sounds bilaterally.  Chest x-ray showing concerns for multilobar pneumonia.  Cultures finalized showing moderate Candida albicans, likely contamination patient remains afebrile.  Leukocytosis is likely secondary to steroid use.  Vancomycin and cefepime to end today.  Overall, patient does have complicated picture.  Will need to consider chronic aspiration pneumonia, would also want to consider interstitial pneumonitis.  Patient also have new baseline with multiple insults over time.  Patient also does have underlying COPD.  Can also consider organizing pneumonia given multiple aspiration events.  Picture could also be complicated by volume overload.  Anticipate CRRT today will help with fluid overload. -CRRT today -Continue Brovana,  Pulmicort, Atrovent -Continue Xopenex/albuterol as needed -Aggressive pulmonary toilet with IS and flutter valve -Vancomycin and cefepime off today -Monitor white counts  -Monitor fever curve  -Follow cultures  -Goal O2 88-92% -Continue hydrocortisone 50 mg twice daily  #Septic shock 2/2 above, resolving Patient has been weaned off pressors at this time.  Blood pressure maintaining well. -Continue to monitor blood pressure closely -Avoid antihypertensive at this time -If patient does end up needing CRRT, will need to monitor blood pressure closely  #AKI #Anion gap metabolic acidosis likely secondary to uremia #Uremia #Hyponatremia Creatinine is still trending up to 5.37 today.  Patient did have increased urine output yesterday than day before yesterday.  Patient is uremic with BUN greater than 100 now.  Patient is mentating well, but does wax and wane.  I do wonder if patient will likely need CRRT.  Will need to put a line in today.  Did discontinue fluid infusion given concern for fluid on the lungs yesterday. -Monitor BMP -Nephrology following -Will need line for CRRT today  #Iron deficiency anemia Hemoglobin did decrease to 8.2 from 9.6 yesterday.  No evidence of bleeding.  Will continue to monitor.  Given active infection will not replace iron at this time. -Transfuse to keep hemoglobin greater than 7 -Monitor CBC  #Esophageal Dysmotility ( per swallow eval 6/30) possibly contributing to slow to resolve pneumonia Patient still has core track in.  GI did state that they would consider scoping if wean down to 1 to 2 L of oxygen.  Not able do so at this point.  -Continue feeds with cortrak  #Weight loss >> 15 pounds in the last month Protein supplements dietitian following.  Started trickle feeds  #Goals of care Palliative care following for goals of care conversation.  Patient is very sick with multiorgan system concerns.  Patient wants to remain a full code, and family does  as well.  Continue goals of care conversation.  Best Practice (right click and "Reselect all SmartList Selections" daily)   Diet/type: trickle feeds  DVT prophylaxis: Heparin GI prophylaxis: Famotidine Lines: 3 peripheral IVs, Foley urinary catheter, Cortrak Foley:  N/A Code Status:  full code Last date of multidisciplinary goals of care discussion (sister updated at bedside)  Labs   CBC: Recent Labs  Lab 01/06/23 1332 01/07/23 0145 01/08/23 0407 01/08/23 1148 01/08/23 1622 01/08/23 1702 01/09/23 0009 01/09/23 0135 01/10/23 0527 01/11/23 0741 01/12/23 0047  WBC 20.2*   < > 14.9*   < > 17.2*  --   --  17.6* 10.6* 16.9* 19.7*  NEUTROABS 18.1*  --  11.6*  --  14.4*  --   --   --   --   --  18.4*  HGB 8.7*   < > 6.4*   < > 10.0*   < > 8.8* 7.8* 8.5* 9.6* 8.2*  HCT 26.6*   < > 19.8*   < > 29.6*   < > 26.0* 23.3* 24.6* 28.3* 24.5*  MCV 88.1   < > 86.8   < > 83.1  --   --  85.7 84.5 84.2 83.9  PLT 587*   < > 649*   < > 611*  --   --  474* 531* 566* 713*   < > = values in this interval not displayed.    Basic Metabolic Panel: Recent Labs  Lab 01/08/23 1622 01/08/23 1702 01/09/23 0009 01/09/23 0135 01/09/23 1800 01/10/23 0527 01/10/23 1938 01/11/23 0741 01/12/23 0047  NA 126*   < > 128* 130*  --  129*  --  127* 128*  K 3.9   < > 4.1 3.8  --  4.2  --  4.1 4.1  CL 96*  --   --  94*  --  94*  --  95* 95*  CO2 17*  --   --  19*  --  17*  --  15* 16*  GLUCOSE 156*  --   --  180*  --  281*  --  131* 187*  BUN 66*  --   --  68*  --  84*  --  93* 102*  CREATININE 4.26*  --   --  4.43*  --  5.14*  --  5.28* 5.37*  CALCIUM 7.8*  --   --  7.4*  --  7.5*  --  7.5* 7.4*  MG 2.3  --   --  2.1 2.2 2.4 2.4 2.4  --   PHOS 5.2*  --   --   --  7.4* 8.0* 6.9* 6.9* 6.7*   < > = values in this interval not displayed.   GFR: Estimated Creatinine Clearance: 10.5 mL/min (A) (by C-G formula based on SCr of 5.37 mg/dL (H)). Recent Labs  Lab 01/06/23 1540 01/06/23 1758 01/06/23 2303  01/07/23 0145 01/08/23 0407 01/09/23 0135 01/10/23 0527 01/11/23 0741 01/12/23 0047  WBC  --   --   --  15.0*   < >  17.6* 10.6* 16.9* 19.7*  LATICACIDVEN 2.7* 3.7* 2.8* 1.3  --   --   --   --   --    < > = values in this interval not displayed.    Liver Function Tests: Recent Labs  Lab 01/08/23 0407 01/08/23 1622 01/12/23 0047  AST 43* 35  --   ALT 16 15  --   ALKPHOS 79 87  --   BILITOT 0.4 0.5  --   PROT 6.0* 6.0*  --   ALBUMIN <1.5* 1.5* <1.5*   No results for input(s): "LIPASE", "AMYLASE" in the last 168 hours. No results for input(s): "AMMONIA" in the last 168 hours.  ABG    Component Value Date/Time   PHART 7.213 (L) 01/09/2023 0009   PCO2ART 52.9 (H) 01/09/2023 0009   PO2ART 170 (H) 01/09/2023 0009   HCO3 17.9 (L) 01/10/2023 0732   TCO2 23 01/09/2023 0009   ACIDBASEDEF 7.1 (H) 01/10/2023 0732   O2SAT 93.2 01/10/2023 0732     Coagulation Profile: Recent Labs  Lab 01/06/23 1540  INR 1.2    Cardiac Enzymes: No results for input(s): "CKTOTAL", "CKMB", "CKMBINDEX", "TROPONINI" in the last 168 hours.  HbA1C: Hgb A1c MFr Bld  Date/Time Value Ref Range Status  12/01/2022 05:41 AM 6.8 (H) 4.8 - 5.6 % Final    Comment:    (NOTE)         Prediabetes: 5.7 - 6.4         Diabetes: >6.4         Glycemic control for adults with diabetes: <7.0     CBG: Recent Labs  Lab 01/11/23 1143 01/11/23 1506 01/11/23 1930 01/11/23 2351 01/12/23 0354  GLUCAP 147* 159* 163* 183* 170*    Review of Systems:   Shortness of breath with exertion and nausea, which is better than when admitted  Past Medical History:  He,  has a past medical history of Arthritis, Asthma, Emphysema lung (HCC), Essential hypertension, Ischemic heart disease, and Type 2 diabetes mellitus (HCC).   Surgical History:   Past Surgical History:  Procedure Laterality Date   BIOPSY  08/30/2018   Procedure: BIOPSY;  Surgeon: Malissa Hippo, MD;  [facility];  Service: Endoscopy;;   gastric    BIOPSY  11/05/2020   Procedure: BIOPSY;  Surgeon: Dolores Frame, MD;  [facility]: [facility];  Service: Gastroenterology;;   COLONOSCOPY     COLONOSCOPY WITH PROPOFOL N/A 11/05/2020   Procedure: COLONOSCOPY WITH PROPOFOL;  Surgeon: Dolores Frame, MD;  [facility]: [facility];  Service: Gastroenterology;  Laterality: N/A;  Am   ESOPHAGEAL DILATION N/A 05/21/2019   Procedure: ESOPHAGEAL DILATION;  Surgeon: Malissa Hippo, MD;  [facility]: [facility];  Service: Endoscopy;  Laterality: N/A;   ESOPHAGOGASTRODUODENOSCOPY N/A 05/21/2019   Procedure: ESOPHAGOGASTRODUODENOSCOPY (EGD);  Surgeon: Malissa Hippo, MD;  [facility]: [facility];  Service: Endoscopy;  Laterality: N/A;  730   ESOPHAGOGASTRODUODENOSCOPY (EGD) WITH PROPOFOL N/A 08/30/2018   Procedure: ESOPHAGOGASTRODUODENOSCOPY (EGD) WITH PROPOFOL;  Surgeon: Malissa Hippo, MD;  [facility];  Service: Endoscopy;  Laterality: N/A;  1:30   ESOPHAGOGASTRODUODENOSCOPY (EGD) WITH PROPOFOL N/A 11/05/2020   Procedure: ESOPHAGOGASTRODUODENOSCOPY (EGD) WITH PROPOFOL;  Surgeon: Dolores Frame, MD;  [facility]: [facility];  Service: Gastroenterology;  Laterality: N/A;   HERNIA REPAIR     bilateral lower abdomen   POLYPECTOMY  11/05/2020   Procedure: POLYPECTOMY;  Surgeon: Dolores Frame, MD;  [facility];  Service: Gastroenterology;;   Gaspar Bidding DILATION  11/05/2020   Procedure: Gaspar Bidding DILATION;  Surgeon: Marguerita Merles, Reuel Boom, MD;  Location: [facility];  Service: Gastroenterology;;     Social History:   reports that he quit smoking about 29 years ago. His smoking use included cigarettes. He has a 66.00 pack-year smoking history. He has never used smokeless tobacco. He reports that he does not currently use alcohol. He reports that he does not use drugs.   Family History:  His family history is not on file.   Allergies Allergies  Allergen Reactions    Penicillins Rash    No problems with ampicillin during hospitalization     Home Medications  Prior to Admission medications   Medication Sig Start Date End Date Taking? Authorizing Provider  acetaminophen (TYLENOL) 325 MG tablet Take 2 tablets (650 mg total) by mouth every 6 (six) hours as needed for mild pain (or Fever >/= 101). 11/26/19  Yes Emokpae, Courage, MD  albuterol (VENTOLIN HFA) 108 (90 Base) MCG/ACT inhaler Inhale 2 puffs into the lungs every 4 (four) hours as needed for wheezing or shortness of breath. Patient taking differently: Inhale 1 puff into the lungs every 4 (four) hours as needed for wheezing or shortness of breath. 11/26/19  Yes Emokpae, Courage, MD  ALPRAZolam Prudy Feeler) 1 MG tablet Take 1 tablet (1 mg total) by mouth 3 (three) times daily as needed for anxiety. Patient taking differently: Take 1-2 mg by mouth at bedtime as needed for anxiety or sleep. 01/28/19  Yes Welborn, Ryan, DO  amLODipine (NORVASC) 10 MG tablet Take 10 mg by mouth at bedtime.   Yes [provider]  Budeson-Glycopyrrol-Formoterol (BREZTRI AEROSPHERE) 160-9-4.8 MCG/ACT AERO Inhale 2 puffs into the lungs in the morning and at bedtime. 12/13/22  Yes Leroy Sea, MD  buprenorphine (SUBUTEX) 8 MG SUBL SL tablet Place 4 mg under the tongue as needed (pain). 01/04/19  Yes [provider]  cetirizine (ZYRTEC) 10 MG tablet Take 10 mg by mouth at bedtime.   Yes [provider]  famotidine (PEPCID) 20 MG tablet Take 1 tablet (20 mg total) by mouth 2 (two) times daily for 7 days. Take one tablet twice daily for two days Patient taking differently: Take 20 mg by mouth as needed for heartburn or indigestion. 12/30/22 01/06/23 Yes Gerhard Munch, MD  fluticasone [facility]) 50 MCG/ACT nasal spray Place 1 spray into both nostrils daily. 08/22/18  Yes [provider]  gabapentin (NEURONTIN) 400 MG capsule Take 1 capsule (400 mg total) by mouth every 8 (eight) hours as needed. Patient  taking differently: Take 400-800 mg by mouth as needed (pain). 01/28/19  Yes Welborn, Ryan, DO  glipiZIDE (GLUCOTROL XL) 10 MG 24 hr tablet Take 10 mg by mouth daily. 11/06/22  Yes [provider]  guaiFENesin (MUCINEX) 600 MG 12 hr tablet Take 1 tablet (600 mg total) by mouth 2 (two) times daily as needed for cough. Patient taking differently: Take 600 mg by mouth 2 (two) times daily as needed for cough or to loosen phlegm. 12/13/22  Yes Leroy Sea, MD  metFORMIN (GLUCOPHAGE) 1000 MG tablet Take 1,000 mg by mouth 2 (two) times daily with a meal.  12/07/14  Yes [provider]  montelukast (SINGULAIR) 10 MG tablet Take 1 tablet (10 mg total) by mouth at bedtime. 06/15/22  Yes Mannam, Praveen, MD  ondansetron (ZOFRAN-ODT) 4 MG disintegrating tablet Take 1 tablet (4 mg total) by mouth every 8 (eight) hours as  needed for nausea or vomiting. Consider taking 30 minutes prior to meals. Patient taking differently: Take 4 mg by mouth as needed for nausea or vomiting. 12/30/22  Yes Gerhard Munch, MD  pantoprazole (PROTONIX) 40 MG tablet Take 1 tablet (40 mg total) by mouth daily. 12/15/20  Yes Dolores Frame, MD  pramipexole (MIRAPEX) 0.5 MG tablet Take 0.5 mg by mouth at bedtime. 11/04/22  Yes [provider]  pravastatin (PRAVACHOL) 20 MG tablet Take 20 mg by mouth every evening.   Yes [provider]  sucralfate (CARAFATE) 1 g tablet Take 1 tablet (1 g total) by mouth 4 (four) times daily -  with meals and at bedtime. 12/30/22  Yes Gerhard Munch, MD  valsartan (DIOVAN) 160 MG tablet Take 160 mg by mouth daily. 11/06/22  Yes [provider]  aspirin EC 81 MG tablet Take 1 tablet (81 mg total) by mouth daily. Swallow whole. Patient not taking: Reported on 01/06/2023 09/30/20   Jonelle Sidle, MD  linezolid (ZYVOX) 600 MG tablet Take 1 tablet (600 mg total) by mouth every 12 (twelve) hours. Patient not taking: Reported on 01/06/2023 12/13/22   Leroy Sea, MD        Modena Slater, DO  Internal Medicine Resident PGY-2 01/12/2023 7:00 AM

## 2023-01-12 NOTE — Progress Notes (Signed)
Nutrition Follow-up  DOCUMENTATION CODES:  Non-severe (moderate) malnutrition in context of acute illness/injury  INTERVENTION:  Osmolite 1.5 goal rate of 58ml/hr (1200 ml per day) Advance to 40 and then advance by 10mL q6h to goal Prosource TF20 60ml 2x/d Goal tube feeding regimen provides 1960 kcal, 115 gm protein, 914 ml free water daily Renavite daily for increased micronutrient needs with CRRT  NUTRITION DIAGNOSIS:  Moderate Malnutrition related to acute illness (n/v, recurrent aspiration PNA) as evidenced by mild fat depletion, moderate muscle depletion, mild muscle depletion. - remains applicable  GOAL:  Patient will meet greater than or equal to 90% of their needs - not progressing, no nutrition support infusing at this time  MONITOR:  Labs, Weight trends, Diet advancement, TF tolerance  REASON FOR ASSESSMENT:  Ventilator Assessment of nutrition requirement/status  ASSESSMENT:  77 y.o. male admits related to nausea and vomiting, and cough. PMH includes: T2DM, HTN, COPD. Pt is currently receiving medical management related to pneumonia.  6/30 - s/p MBS- findings of esophageal dysphagia; recommend regular diet, thin liquids 7/1 - diet downgraded to NPO; Cortrak placed (tip in distal stomach); decline in respiratory status moved to ICU and intubated  7/3 - extubated  Pt resting in bed at the time of assessment. Wakes to name being called. Seems a little disoriented (could be due to hard of hearing?), but will answer questions. Coughing up a lot of secretions this AM. On high flow nasal canal. Noted that pt is having bowel movements but that TF remain at 20. Discussed with CCM, ok to start advancing to goal.   Noted that renal function appears to be declining - nephrology plans to start CRRT today. Hopeful for renal recovery now that pressor support is weaned. Will add additional protein while pt is on CRRT.   Intake/Output Summary (Last 24 hours) at 01/12/2023 1107 Last  data filed at 01/12/2023 0700 Gross per 24 hour  Intake 1232.85 ml  Output 495 ml  Net 737.85 ml  Net IO Since Admission: 6,927.22 mL [01/12/23 1107]  Nutritionally Relevant Medications: Scheduled Meds:  famotidine  10 mg Daily   (PROSource TF20)  60 mL Daily   hydrocortisone sod succinate   50 mg Q12H   insulin aspart  0-9 Units Q4H   metoCLOPramide (REGLAN)   5 mg Q8H   pantoprazole (PROTONIX) IV  40 mg Q12H   pramipexole  0.5 mg QHS   pravastatin  20 mg QPM   saccharomyces boulardii  250 mg BID   senna  1 tablet QHS   thiamine  100 mg Daily   vancomycin   See admin instructions   Continuous Infusions:  ceFEPime (MAXIPIME) IV Stopped (01/11/23 1615)   OSMOLITE 1.5 CAL) 20 mL/hr at 01/12/23 0700   Labs Reviewed: Na 128, chloride 95 BUN 102, creatinine 5.37 Phosphorus 6.7 CBG ranges from 126-183 mg/dL over the last 24 hours  NUTRITION - FOCUSED PHYSICAL EXAM: Flowsheet Row Most Recent Value  Orbital Region Mild depletion  Upper Arm Region Moderate depletion  Thoracic and Lumbar Region No depletion  Buccal Region Mild depletion  Temple Region No depletion  Clavicle Bone Region Mild depletion  Clavicle and Acromion Bone Region Moderate depletion  Scapular Bone Region Moderate depletion  Dorsal Hand No depletion  Patellar Region Moderate depletion  Anterior Thigh Region Moderate depletion  Posterior Calf Region No depletion  Edema (RD Assessment) None  Hair Reviewed  Eyes Reviewed  Mouth Reviewed  Skin Reviewed  Nails Reviewed   Diet Order:  Diet Order             Diet NPO time specified  Diet effective now                   EDUCATION NEEDS:  No education needs have been identified at this time  Skin:  Skin Assessment: Reviewed RN Assessment  Last BM:  7/4 - type 3  Height:  Ht Readings from Last 1 Encounters:  01/06/23 5\' 5"  (1.651 m)    Weight:  Wt Readings from Last 1 Encounters:  01/11/23 73.9 kg    Ideal Body Weight:  61.8 kg  BMI:   Body mass index is 27.11 kg/m.  Estimated Nutritional Needs:  Kcal:  1800-2000 Protein:  95-110g Fluid:  >/=1.8L    Greig Castilla, RD, LDN Clinical Dietitian RD pager # available in [facility]  After hours/weekend pager # available in [facility]

## 2023-01-12 NOTE — Progress Notes (Signed)
SLP Cancellation Note  Patient Details Name: Travis Beck MRN: [id number] DOB: 04-10-1946   Cancelled treatment:  Pt not medically stable for SLP intervention.  Will follow along.  Eily Louvier L. Samson Frederic, MA CCC/SLP Clinical Specialist - Acute Care SLP Acute Rehabilitation Services Office number [phone]          Blenda Mounts [name] 01/12/2023, 7:51 AM

## 2023-01-12 NOTE — Telephone Encounter (Signed)
Called and spoke with patient's niece Puyear. She stated that the patient has been in the hospital since July 1st. She wanted to know if any of our docs would come to see the patient. I reviewed his hospital records and advised her that many of our docs are have already routed on the patient. She is also aware that there is always a provider at the hospital that she can ask to speak with. She verbalized understanding.   Will route to Dr. Isaiah Serge to let him know that the patient is currently in the hospital.

## 2023-01-12 NOTE — Progress Notes (Signed)
eLink Physician-Brief Progress Note Patient Name: Amier Diedrich DOB: Sep 19, 1945 MRN: [id number]   Date of Service  01/12/2023  HPI/Events of Note  Patient complaining of low back pain.  eICU Interventions  A/V exam done.  He complains of a 7 out of 10 low back pain.  Lidocaine patch prescribed.      Intervention Category Intermediate Interventions: Pain - evaluation and management  Carilyn Goodpasture 01/12/2023, 8:50 PM

## 2023-01-12 NOTE — Progress Notes (Signed)
Patient ID: Travis Beck, male   DOB: [birth year], 77 y.o.   MRN: [id number] S: DEveloped worsening SOB yesterday and IVF's stopped.  Feels tight in his chest O:BP 129/70   Pulse 91   Temp 98 F (36.7 C) (Oral)   Resp 15   Ht 5\' 5"  (1.651 m)   Wt 73.9 kg   SpO2 98%   BMI 27.11 kg/m   Intake/Output Summary (Last 24 hours) at 01/12/2023 0932 Last data filed at 01/12/2023 0700 Gross per 24 hour  Intake 1470.9 ml  Output 530 ml  Net 940.9 ml   Intake/Output: I/O last 3 completed shifts: In: 3229 [I.V.:2297.2; NG/GT:832; IV Piggyback:99.9] Out: 860 [Urine:860]  Intake/Output this shift:  No intake/output data recorded. Weight change:  Gen: ill-appearing but NAD CVS: RRR Resp: scattered rhonchi and exp wheezes bilaterally Abd: +BS, soft, NT/ND Ext: no edema  Recent Labs  Lab 01/07/23 0145 01/08/23 0407 01/08/23 1622 01/08/23 1702 01/08/23 2132 01/09/23 0009 01/09/23 0135 01/09/23 1800 01/10/23 0527 01/10/23 1938 01/11/23 0741 01/12/23 0047  NA 126* 129* 126* 126* 127* 128* 130*  --  129*  --  127* 128*  K 3.8 3.8 3.9 4.0 4.0 4.1 3.8  --  4.2  --  4.1 4.1  CL 98 95* 96*  --   --   --  94*  --  94*  --  95* 95*  CO2 17* 19* 17*  --   --   --  19*  --  17*  --  15* 16*  GLUCOSE 76 70 156*  --   --   --  180*  --  281*  --  131* 187*  BUN 63* 65* 66*  --   --   --  68*  --  84*  --  93* 102*  CREATININE 3.94* 4.19* 4.26*  --   --   --  4.43*  --  5.14*  --  5.28* 5.37*  ALBUMIN  --  <1.5* 1.5*  --   --   --   --   --   --   --   --  <1.5*  CALCIUM 7.1* 7.8* 7.8*  --   --   --  7.4*  --  7.5*  --  7.5* 7.4*  PHOS  --  4.0 5.2*  --   --   --   --  7.4* 8.0* 6.9* 6.9* 6.7*  AST  --  43* 35  --   --   --   --   --   --   --   --   --   ALT  --  16 15  --   --   --   --   --   --   --   --   --    Liver Function Tests: Recent Labs  Lab 01/08/23 0407 01/08/23 1622 01/12/23 0047  AST 43* 35  --   ALT 16 15  --   ALKPHOS 79 87  --   BILITOT 0.4 0.5  --   PROT 6.0*  6.0*  --   ALBUMIN <1.5* 1.5* <1.5*   No results for input(s): "LIPASE", "AMYLASE" in the last 168 hours. No results for input(s): "AMMONIA" in the last 168 hours. CBC: Recent Labs  Lab 01/08/23 0407 01/08/23 1148 01/08/23 1622 01/08/23 1702 01/09/23 0135 01/10/23 0527 01/11/23 0741 01/12/23 0047  WBC 14.9*   < > 17.2*  --  17.6* 10.6* 16.9* 19.7*  NEUTROABS  11.6*  --  14.4*  --   --   --   --  18.4*  HGB 6.4*   < > 10.0*   < > 7.8* 8.5* 9.6* 8.2*  HCT 19.8*   < > 29.6*   < > 23.3* 24.6* 28.3* 24.5*  MCV 86.8   < > 83.1  --  85.7 84.5 84.2 83.9  PLT 649*   < > 611*  --  474* 531* 566* 713*   < > = values in this interval not displayed.   Cardiac Enzymes: No results for input(s): "CKTOTAL", "CKMB", "CKMBINDEX", "TROPONINI" in the last 168 hours. CBG: Recent Labs  Lab 01/11/23 1506 01/11/23 1930 01/11/23 2351 01/12/23 0354 01/12/23 0807  GLUCAP 159* 163* 183* 170* 177*    Iron Studies: No results for input(s): "IRON", "TIBC", "TRANSFERRIN", "FERRITIN" in the last 72 hours. Studies/Results: DG CHEST PORT 1 VIEW  Result Date: 01/11/2023 CLINICAL DATA:  Respiratory failure EXAM: PORTABLE CHEST 1 VIEW COMPARISON:  01/08/2023 FINDINGS: Dose Bilateral diffuse interstitial and alveolar airspace opacities with more focal airspace disease in the right upper lobe. No pleural effusion or pneumothorax. Heart and mediastinal contours are unremarkable. Feeding tube coursing below the diaphragm. No acute osseous abnormality. IMPRESSION: 1. Bilateral diffuse interstitial and alveolar airspace opacities with more focal airspace disease in the right upper lobe concerning for multilobar pneumonia. Electronically Signed   By: Elige Ko M.D.   On: 01/11/2023 17:10    arformoterol  15 mcg Nebulization BID   aspirin  81 mg Per Tube Daily   budesonide (PULMICORT) nebulizer solution  0.5 mg Nebulization BID   Chlorhexidine Gluconate Cloth  6 each Topical Daily   feeding supplement (PROSource  TF20)  60 mL Per Tube Daily   fluticasone  2 spray Each Nare Daily   guaiFENesin  15 mL Per Tube Q4H   heparin  5,000 Units Subcutaneous Q8H   insulin aspart  0-9 Units Subcutaneous Q4H   loratadine  10 mg Per Tube Daily   metoCLOPramide (REGLAN) injection  5 mg Intravenous Q8H   montelukast  10 mg Per Tube QHS   [START ON 01/13/2023] pantoprazole (PROTONIX) IV  40 mg Intravenous QHS   pramipexole  0.5 mg Per Tube QHS   pravastatin  20 mg Per Tube QPM   revefenacin  175 mcg Nebulization Daily   saccharomyces boulardii  250 mg Per Tube BID   senna  1 tablet Per Tube QHS   thiamine  100 mg Per Tube Daily    BMET    Component Value Date/Time   NA 128 (L) 01/12/2023 0047   K 4.1 01/12/2023 0047   CL 95 (L) 01/12/2023 0047   CO2 16 (L) 01/12/2023 0047   GLUCOSE 187 (H) 01/12/2023 0047   BUN 102 (H) 01/12/2023 0047   CREATININE 5.37 (H) 01/12/2023 0047   CALCIUM 7.4 (L) 01/12/2023 0047   GFRNONAA 10 (L) 01/12/2023 0047   GFRAA >60 12/15/2019 0812   CBC    Component Value Date/Time   WBC 19.7 (H) 01/12/2023 0047   RBC 2.92 (L) 01/12/2023 0047   HGB 8.2 (L) 01/12/2023 0047   HCT 24.5 (L) 01/12/2023 0047   PLT 713 (H) 01/12/2023 0047   MCV 83.9 01/12/2023 0047   MCH 28.1 01/12/2023 0047   MCHC 33.5 01/12/2023 0047   RDW 15.1 01/12/2023 0047   LYMPHSABS 0.5 (L) 01/12/2023 0047   MONOABS 0.5 01/12/2023 0047   EOSABS 0.0 01/12/2023 0047   BASOSABS 0.0 01/12/2023  6295    Assessment/Plan:  AKI - presumably ischemic ATN in setting of sepsis due to pneumonia and poor po intake. Vanco trough was high at 28.  BUN/Cr continue to climb.  Discussed with PCCM team and plan to initiate CRRT today and follow.  Will continue to follow closely.  Hopefully his renal function will start to improve now that he is off of pressors.   Avoid nephrotoxic medications including NSAIDs and iodinated intravenous contrast exposure unless the latter is absolutely indicated.  Preferred narcotic agents for  pain control are hydromorphone, fentanyl, and methadone. Morphine should not be used. Avoid Baclofen and avoid oral sodium phosphate and magnesium citrate based laxatives / bowel preps. Continue strict Input and Output monitoring. Will monitor the patient closely with you and intervene or adjust therapy as indicated by changes in clinical status/labs   Sepsis - due to recurrent multifocal pneumonia - possible aspiration.  Currently off of norepinephrine.   Acute hypoxic respiratory failure - required intubation now extubated.  Plan per PCCM    Iron deficiency anemia - continue to follow SIADH - stable serum sodium levels. AGMA - due to #1.  Continue to follow Severe protein malnutrition - alb 1.5.  cortrak and supplements per PCCM. Esophageal dysmotility - speech path following.     Irena Cords, MD [facility]

## 2023-01-13 ENCOUNTER — Inpatient Hospital Stay (HOSPITAL_COMMUNITY): Payer: Medicare Other

## 2023-01-13 DIAGNOSIS — J9601 Acute respiratory failure with hypoxia: Secondary | ICD-10-CM | POA: Diagnosis not present

## 2023-01-13 LAB — GLUCOSE, CAPILLARY
Glucose-Capillary: 123 mg/dL — ABNORMAL HIGH (ref 70–99)
Glucose-Capillary: 130 mg/dL — ABNORMAL HIGH (ref 70–99)
Glucose-Capillary: 151 mg/dL — ABNORMAL HIGH (ref 70–99)
Glucose-Capillary: 157 mg/dL — ABNORMAL HIGH (ref 70–99)
Glucose-Capillary: 204 mg/dL — ABNORMAL HIGH (ref 70–99)
Glucose-Capillary: 220 mg/dL — ABNORMAL HIGH (ref 70–99)
Glucose-Capillary: 224 mg/dL — ABNORMAL HIGH (ref 70–99)

## 2023-01-13 LAB — BLOOD GAS, VENOUS
Acid-base deficit: 2.3 mmol/L — ABNORMAL HIGH (ref 0.0–2.0)
Bicarbonate: 22.5 mmol/L (ref 20.0–28.0)
Drawn by: 164
O2 Saturation: 79.5 %
Patient temperature: 37
pCO2, Ven: 38 mmHg — ABNORMAL LOW (ref 44–60)
pH, Ven: 7.38 (ref 7.25–7.43)
pO2, Ven: 46 mmHg — ABNORMAL HIGH (ref 32–45)

## 2023-01-13 LAB — CBC
HCT: 22.7 % — ABNORMAL LOW (ref 39.0–52.0)
Hemoglobin: 7.7 g/dL — ABNORMAL LOW (ref 13.0–17.0)
MCH: 28.2 pg (ref 26.0–34.0)
MCHC: 33.9 g/dL (ref 30.0–36.0)
MCV: 83.2 fL (ref 80.0–100.0)
Platelets: 593 10*3/uL — ABNORMAL HIGH (ref 150–400)
RBC: 2.73 MIL/uL — ABNORMAL LOW (ref 4.22–5.81)
RDW: 15.1 % (ref 11.5–15.5)
WBC: 18.3 10*3/uL — ABNORMAL HIGH (ref 4.0–10.5)
nRBC: 0 % (ref 0.0–0.2)

## 2023-01-13 LAB — RENAL FUNCTION PANEL
Albumin: <1.5 g/dL — ABNORMAL LOW (ref 3.5–5.0)
Albumin: <1.5 g/dL — ABNORMAL LOW (ref 3.5–5.0)
Anion gap: 11 (ref 5–15)
Anion gap: 9 (ref 5–15)
BUN: 82 mg/dL — ABNORMAL HIGH (ref 8–23)
BUN: 59 mg/dL — ABNORMAL HIGH (ref 8–23)
CO2: 20 mmol/L — ABNORMAL LOW (ref 22–32)
CO2: 22 mmol/L (ref 22–32)
Calcium: 7.2 mg/dL — ABNORMAL LOW (ref 8.9–10.3)
Calcium: 7.1 mg/dL — ABNORMAL LOW (ref 8.9–10.3)
Chloride: 102 mmol/L (ref 98–111)
Chloride: 101 mmol/L (ref 98–111)
Creatinine, Ser: 3.82 mg/dL — ABNORMAL HIGH (ref 0.61–1.24)
Creatinine, Ser: 2.61 mg/dL — ABNORMAL HIGH (ref 0.61–1.24)
GFR, Estimated: 15 mL/min — ABNORMAL LOW (ref >60)
GFR, Estimated: 24 mL/min — ABNORMAL LOW (ref >60)
Glucose, Bld: 128 mg/dL — ABNORMAL HIGH (ref 70–99)
Glucose, Bld: 219 mg/dL — ABNORMAL HIGH (ref 70–99)
Phosphorus: 4.3 mg/dL (ref 2.5–4.6)
Phosphorus: 3.4 mg/dL (ref 2.5–4.6)
Potassium: 3.8 mmol/L (ref 3.5–5.1)
Potassium: 4.6 mmol/L (ref 3.5–5.1)
Sodium: 133 mmol/L — ABNORMAL LOW (ref 135–145)
Sodium: 132 mmol/L — ABNORMAL LOW (ref 135–145)

## 2023-01-13 LAB — MAGNESIUM: Magnesium: 2.2 mg/dL (ref 1.7–2.4)

## 2023-01-13 MED ORDER — ORAL CARE MOUTH RINSE: 15.0000 mL | OROMUCOSAL | Status: DC | PRN

## 2023-01-13 MED ORDER — BUPRENORPHINE HCL 8 MG SL SUBL
4.0000 mg | SUBLINGUAL_TABLET | Freq: Two times a day (BID) | SUBLINGUAL | Status: DC | PRN
  Administered 2023-01-13: 4 mg via SUBLINGUAL
  Filled 2023-01-13: qty 1

## 2023-01-13 MED ORDER — ORAL CARE MOUTH RINSE
15.0000 mL | OROMUCOSAL | Status: DC
  Administered 2023-01-13 – 2023-01-25 (×47): 15 mL via OROMUCOSAL

## 2023-01-13 MED ORDER — METHYLPREDNISOLONE SODIUM SUCC 125 MG IJ SOLR
80.0000 mg | Freq: Two times a day (BID) | INTRAMUSCULAR | Status: AC
  Administered 2023-01-13 – 2023-01-15 (×6): 80 mg via INTRAVENOUS
  Filled 2023-01-13 (×6): qty 2

## 2023-01-13 NOTE — Progress Notes (Signed)
Updated patient's niece Cusano) and sister Hilda Lias) via telephone about patient's status. Per sister, "Cammie will cut the Subutex up into 4 pieces and take one of those pieces when he is hurting." Hilda Lias said that he "never takes a whole one." Will pass this on to the team and dayshift nurse.

## 2023-01-13 NOTE — Progress Notes (Deleted)
SLP Cancellation Note  Patient Details Name: Travis Beck MRN: [id number] DOB: Jul 29, 1945   Cancelled treatment:       Reason Eval/Treat Not Completed: Fatigue/lethargy limiting ability to participate;pt min responsive; family declined; ST will continue efforts   Pat Ab Leaming,M.S., CCC-SLP 01/13/2023, 11:54 AM

## 2023-01-13 NOTE — Progress Notes (Signed)
NAME:  Travis Beck, MRN:  [id number], DOB:  [birth year], LOS: 7 ADMISSION DATE:  01/06/2023, CONSULTATION DATE:  01/08/2023 REFERRING MD:  Marland Mcalpine, CHIEF COMPLAINT:  Aspiration Pneumonia, nausea and vomiting   History of Present Illness:  77 y.o. male with medical history significant of IIDM, HTN, SIDAH, COPD, ( Former smoker quit 1994 with a 66 pack year smoking history diabetic neuropathy, recurrent pneumonia presented 01/06/2023 with worsening of nauseous vomiting cough and shortness of breath.  Patient was recently hospitalized 5/23-5/26 for multifocal MRSA pneumonia  and septic shock requiring vasopressor on admission-stabilized and subsequently discharged home on Augmentin/doxycycline . Pt returned to [facility] 12/07/2022 with fever of 103.1 and ongoing left sided pleuritic pain. He has a small effusion that was not big enough to tap.  He was admitted for incompletely treated PNA-as sputum cultures finalized postdischarge, and Doxycycline did not cover MRSA. He was treated with Zyvox, and discharged home on Zyvox p.o on 12/13/2022. Initially his breathing symptoms improved with Zyvox however patient continued to experience frequent nausea, vomiting and cough. He presented to the ED 6/29 as these symptoms were worsening.  In the ED he was mildly febrile at 99.6, He had  tachycardia, oxygen sats were 93% on 3 L. CXR again showed a multifocal pneumonia, right sided. Na 128/ Creatinine of 4.2, BUN 66 and WBC of 20. His HGB was 8.7.Respiratory Panel was negative. Lactic Acid trend since admission 2.7>>2.8>>1,3 Pulmonary was  asked to consult for recurrent vs slow to resolve pneumonia and hypoxemia . Of note , patient is followed in the [facility]  Pulmonary clinic by Dr. Isaiah Serge and Rubye Oaks NP.  Per family patient has had about a 15 pound weight loss since the end of May 24 when pneumonia was initially diagnosed.  Pertinent  Medical History   Past Medical History:  Diagnosis Date    Arthritis    Asthma    Childhood   Emphysema lung (HCC)    Essential hypertension    Ischemic heart disease    Myoview 2020 indicating inferior infarct scar with mild peri-infarct ischemia - managed medically   Type 2 diabetes mellitus (HCC)      Significant Hospital Events: Including procedures, antibiotic start and stop dates in addition to other pertinent events         5/23-5/26>> hospitalization for septic shock from multilobar PNA. 5/30>>6/5>> fever 103 F at home-ongoing left-sided pleuritic chest pain- 6/29>> Recurrent multifocal  ? Aspiration  pneumonia  Interim History / Subjective:   Overnight, no acute events.  Patient evaluated bedside this morning.  Patient is coughing, has been coughing all night.  Patient states that his right side of his chest is sore which gets worse with coughing.  He denies any shortness of breath.  Patient is clearing secretions well.   Objective   Blood pressure 130/77, pulse 84, temperature 98 F (36.7 C), temperature source Oral, resp. rate 17, height 5\' 5"  (1.651 m), weight 73.2 kg, SpO2 98 %.  Temperature 97.9-98.7 F, pulse 75-96, respiratory rate 15-25, blood pressure currently 130/77 off pressors.  O2 saturation 99-100% on high flow nasal cannula 60/40.    FiO2 (%):  [80 %-90 %] 80 %   Intake/Output Summary (Last 24 hours) at 01/13/2023 0643 Last data filed at 01/13/2023 0600 Gross per 24 hour  Intake 855.34 ml  Output 1477.6 ml  Net -622.26 ml    Filed Weights   01/10/23 0455 01/11/23 0800 01/13/23 0320  Weight: 72.5 kg 73.9 kg 73.2  kg    Examination: General: Awake, able to follow instructions, interactive HENT:  Atraumatic, normocephalic  Lungs: Bibasilar crackles appreciated, expiratory wheezing, labored breathing Cardiovascular: Regular rate and rhythm   Abdomen: Soft, nontender, normal active bowel sounds Extremities: 2+ pitting edema bilaterally Neuro: Able to follow all instructions  Labs reviewed 07/05 Glucose  ranging 123-170 CBC: White count 18.3, hemoglobin 7.7 (8.2), platelet 593 (713) Magnesium 2.2 RFP: Sodium 133 (129), bicarb 20 (16), creatinine 3.82 (5.39), GFR 15.  Respiratory culture: Moderate Candida albicans Urine culture: No growth Blood culture x 5 days  Output total of 1500 mL yesterday.  Chest x-ray 07/06 showing complete opacification noted to left lung lobe, increased compared to x-ray on 01/12/2023.  Resolved Hospital Problem list     Assessment & Plan:   #Sepsis #Acute hypoxic respiratory failure requiring intubation, resolving #Aspiration pneumonitis #COPD #Leukocytosis Patient evaluated bedside this morning.  Patient still on heated high flow oxygen at 80/40.  Patient is having labored breathing.  On exam, patient has 2+ pitting edema as well as chest x-ray findings with complete opacification of left lung.  Patient is becoming more volume overloaded even though we have started CRRT.  Likely is contributing to respiratory distress.  Do wonder if patient is chronically aspirating with increasing tube feeds.  Recommend decreasing tube feeds today.  Patient has completed antibiotic therapy.  Patient could have component of aspiration pneumonitis, would recommend Solu-Medrol today. The patient has worsened today than yesterday. -Continue CRRT -Continue Brovana, Pulmicort, Atrovent -Continue Xopenex/albuterol as needed -Aggressive pulmonary toilet with IS and flutter valve -Start solumedrol 80 mg BID   #Altered mental status Patient has waxing waning altered mental status.  Likely due to hospital delirium.  Could also be uremic, but BUN improving.  Possibly related to hypercapnia.  Could benefit from VBG today to see if patient is retaining. -VBG not showing much CO2 retention   #Septic shock 2/2 above, resolving Patient remained off pressors.  Blood pressure maintaining well.  On CRRT. -Continue to monitor blood pressure closely -Continue CRRT -If patient does end up  needing CRRT, will need to monitor blood pressure closely  #AKI #Uremia #Hyponatremia Patient started CRRT yesterday.  Creatinine trending down well.  Patient had 1500 mL output yesterday.  Patient does still seem volume overloaded.   -Monitor RFP -Nephrology following -Continue CRRT  #Iron deficiency anemia Hemoglobin at 7.7 today.  Down from 8.2.  No evidence of bleeding at this time.  Will continue to monitor.   -Transfuse to keep hemoglobin greater than 7 -Monitor CBC -After acute inflammation, can potentially start IV iron infusions  #Esophageal Dysmotility ( per swallow eval 6/30) possibly contributing to slow to resolve pneumonia Patient still has core track in.  GI did state that they would consider scoping if wean down to 1 to 2 L of oxygen.  Not able do so at this point.  -Continue feeds with cortrak  #Weight loss >> 15 pounds in the last month Patient does have hypoalbuminemia likely due to decreased protein intake.  Started trickle feeds, but I feel that patient is aspirating, I do not think we can increase. -RD following  #Goals of care Did discuss patient could move towards intubation if he does not turn around toady and we did inform family about this today. -Palliative care following    Best Practice (right click and "Reselect all SmartList Selections" daily)   Diet/type: trickle feeds  DVT prophylaxis: Heparin GI prophylaxis: Famotidine Lines: 3 peripheral IVs, Foley urinary catheter,  Cortrak, Right internal jugular  Foley:  N/A Code Status:  full code Last date of multidisciplinary goals of care discussion (sister updated at bedside)  Labs   CBC: Recent Labs  Lab 01/06/23 1332 01/07/23 0145 01/08/23 0407 01/08/23 1148 01/08/23 1622 01/08/23 1702 01/09/23 0135 01/10/23 0527 01/11/23 0741 01/12/23 0047 01/13/23 0051  WBC 20.2*   < > 14.9*   < > 17.2*  --  17.6* 10.6* 16.9* 19.7* 18.3*  NEUTROABS 18.1*  --  11.6*  --  14.4*  --   --   --   --   18.4*  --   HGB 8.7*   < > 6.4*   < > 10.0*   < > 7.8* 8.5* 9.6* 8.2* 7.7*  HCT 26.6*   < > 19.8*   < > 29.6*   < > 23.3* 24.6* 28.3* 24.5* 22.7*  MCV 88.1   < > 86.8   < > 83.1  --  85.7 84.5 84.2 83.9 83.2  PLT 587*   < > 649*   < > 611*  --  474* 531* 566* 713* 593*   < > = values in this interval not displayed.     Basic Metabolic Panel: Recent Labs  Lab 01/09/23 1800 01/10/23 0527 01/10/23 1938 01/11/23 0741 01/12/23 0047 01/12/23 1605 01/13/23 0051  NA  --  129*  --  127* 128* 129* 133*  K  --  4.2  --  4.1 4.1 4.1 3.8  CL  --  94*  --  95* 95* 98 102  CO2  --  17*  --  15* 16* 16* 20*  GLUCOSE  --  281*  --  131* 187* 156* 128*  BUN  --  84*  --  93* 102* 112* 82*  CREATININE  --  5.14*  --  5.28* 5.37* 5.39* 3.82*  CALCIUM  --  7.5*  --  7.5* 7.4* 7.6* 7.2*  MG 2.2 2.4 2.4 2.4  --   --  2.2  PHOS 7.4* 8.0* 6.9* 6.9* 6.7* 7.1* 4.3    GFR: Estimated Creatinine Clearance: 13.6 mL/min (A) (by C-G formula based on SCr of 3.82 mg/dL (H)). Recent Labs  Lab 01/06/23 1540 01/06/23 1758 01/06/23 2303 01/07/23 0145 01/08/23 0407 01/10/23 0527 01/11/23 0741 01/12/23 0047 01/13/23 0051  WBC  --   --   --  15.0*   < > 10.6* 16.9* 19.7* 18.3*  LATICACIDVEN 2.7* 3.7* 2.8* 1.3  --   --   --   --   --    < > = values in this interval not displayed.     Liver Function Tests: Recent Labs  Lab 01/08/23 0407 01/08/23 1622 01/12/23 0047 01/12/23 1605 01/13/23 0051  AST 43* 35  --   --   --   ALT 16 15  --   --   --   ALKPHOS 79 87  --   --   --   BILITOT 0.4 0.5  --   --   --   PROT 6.0* 6.0*  --   --   --   ALBUMIN <1.5* 1.5* <1.5* <1.5* <1.5*    No results for input(s): "LIPASE", "AMYLASE" in the last 168 hours. No results for input(s): "AMMONIA" in the last 168 hours.  ABG    Component Value Date/Time   PHART 7.213 (L) 01/09/2023 0009   PCO2ART 52.9 (H) 01/09/2023 0009   PO2ART 170 (H) 01/09/2023 0009   HCO3 17.9 (L) 01/10/2023 0732  TCO2 23 01/09/2023  0009   ACIDBASEDEF 7.1 (H) 01/10/2023 0732   O2SAT 93.2 01/10/2023 0732     Coagulation Profile: Recent Labs  Lab 01/06/23 1540  INR 1.2     Cardiac Enzymes: No results for input(s): "CKTOTAL", "CKMB", "CKMBINDEX", "TROPONINI" in the last 168 hours.  HbA1C: Hgb A1c MFr Bld  Date/Time Value Ref Range Status  12/01/2022 05:41 AM 6.8 (H) 4.8 - 5.6 % Final    Comment:    (NOTE)         Prediabetes: 5.7 - 6.4         Diabetes: >6.4         Glycemic control for adults with diabetes: <7.0     CBG: Recent Labs  Lab 01/12/23 0807 01/12/23 1154 01/12/23 1708 01/12/23 2357 01/13/23 0312  GLUCAP 177* 180* 170* 130* 123*     Review of Systems:   Shortness of breath with exertion and nausea, which is better than when admitted  Past Medical History:  He,  has a past medical history of Arthritis, Asthma, Emphysema lung (HCC), Essential hypertension, Ischemic heart disease, and Type 2 diabetes mellitus (HCC).   Surgical History:   Past Surgical History:  Procedure Laterality Date   BIOPSY  08/30/2018   Procedure: BIOPSY;  Surgeon: Malissa Hippo, MD;  [facility];  Service: Endoscopy;;  gastric    BIOPSY  11/05/2020   Procedure: BIOPSY;  Surgeon: Dolores Frame, MD;  [facility]: [facility];  Service: Gastroenterology;;   COLONOSCOPY     COLONOSCOPY WITH PROPOFOL N/A 11/05/2020   Procedure: COLONOSCOPY WITH PROPOFOL;  Surgeon: Dolores Frame, MD;  [facility]: [facility];  Service: Gastroenterology;  Laterality: N/A;  Am   ESOPHAGEAL DILATION N/A 05/21/2019   Procedure: ESOPHAGEAL DILATION;  Surgeon: Malissa Hippo, MD;  Location: [facility];  Service: Endoscopy;  Laterality: N/A;   ESOPHAGOGASTRODUODENOSCOPY N/A 05/21/2019   Procedure: ESOPHAGOGASTRODUODENOSCOPY (EGD);  Surgeon: Malissa Hippo, MD;  [facility];  Service: Endoscopy;  Laterality: N/A;  730   ESOPHAGOGASTRODUODENOSCOPY (EGD) WITH PROPOFOL N/A 08/30/2018    Procedure: ESOPHAGOGASTRODUODENOSCOPY (EGD) WITH PROPOFOL;  Surgeon: Malissa Hippo, MD;  [facility];  Service: Endoscopy;  Laterality: N/A;  1:30   ESOPHAGOGASTRODUODENOSCOPY (EGD) WITH PROPOFOL N/A 11/05/2020   Procedure: ESOPHAGOGASTRODUODENOSCOPY (EGD) WITH PROPOFOL;  Surgeon: Dolores Frame, MD;  [facility]: [facility];  Service: Gastroenterology;  Laterality: N/A;   HERNIA REPAIR     bilateral lower abdomen   POLYPECTOMY  11/05/2020   Procedure: POLYPECTOMY;  Surgeon: Dolores Frame, MD;  [facility];  Service: Gastroenterology;;   Gaspar Bidding DILATION  11/05/2020   Procedure: Gaspar Bidding DILATION;  Surgeon: Marguerita Merles, Reuel Boom, MD;  Location: [facility];  Service: Gastroenterology;;     Social History:   reports that he quit smoking about 29 years ago. His smoking use included cigarettes. He has a 66.00 pack-year smoking history. He has never used smokeless tobacco. He reports that he does not currently use alcohol. He reports that he does not use drugs.   Family History:  His family history is not on file.   Allergies Allergies  Allergen Reactions   Penicillins Rash    No problems with ampicillin during hospitalization     Home Medications  Prior to Admission medications   Medication Sig Start Date End Date Taking? Authorizing Provider  acetaminophen (TYLENOL) 325 MG tablet Take 2 tablets (650 mg total) by mouth every  6 (six) hours as needed for mild pain (or Fever >/= 101). 11/26/19  Yes Emokpae, Courage, MD  albuterol (VENTOLIN HFA) 108 (90 Base) MCG/ACT inhaler Inhale 2 puffs into the lungs every 4 (four) hours as needed for wheezing or shortness of breath. Patient taking differently: Inhale 1 puff into the lungs every 4 (four) hours as needed for wheezing or shortness of breath. 11/26/19  Yes Emokpae, Courage, MD  ALPRAZolam Prudy Feeler) 1 MG tablet Take 1 tablet (1 mg total) by mouth 3 (three) times daily as needed for  anxiety. Patient taking differently: Take 1-2 mg by mouth at bedtime as needed for anxiety or sleep. 01/28/19  Yes Welborn, Ryan, DO  amLODipine (NORVASC) 10 MG tablet Take 10 mg by mouth at bedtime.   Yes [provider]  Budeson-Glycopyrrol-Formoterol (BREZTRI AEROSPHERE) 160-9-4.8 MCG/ACT AERO Inhale 2 puffs into the lungs in the morning and at bedtime. 12/13/22  Yes Leroy Sea, MD  buprenorphine (SUBUTEX) 8 MG SUBL SL tablet Place 4 mg under the tongue as needed (pain). 01/04/19  Yes [provider]  cetirizine (ZYRTEC) 10 MG tablet Take 10 mg by mouth at bedtime.   Yes [provider]  famotidine (PEPCID) 20 MG tablet Take 1 tablet (20 mg total) by mouth 2 (two) times daily for 7 days. Take one tablet twice daily for two days Patient taking differently: Take 20 mg by mouth as needed for heartburn or indigestion. 12/30/22 01/06/23 Yes Gerhard Munch, MD  fluticasone [facility]) 50 MCG/ACT nasal spray Place 1 spray into both nostrils daily. 08/22/18  Yes [provider]  gabapentin (NEURONTIN) 400 MG capsule Take 1 capsule (400 mg total) by mouth every 8 (eight) hours as needed. Patient taking differently: Take 400-800 mg by mouth as needed (pain). 01/28/19  Yes Welborn, Ryan, DO  glipiZIDE (GLUCOTROL XL) 10 MG 24 hr tablet Take 10 mg by mouth daily. 11/06/22  Yes [provider]  guaiFENesin (MUCINEX) 600 MG 12 hr tablet Take 1 tablet (600 mg total) by mouth 2 (two) times daily as needed for cough. Patient taking differently: Take 600 mg by mouth 2 (two) times daily as needed for cough or to loosen phlegm. 12/13/22  Yes Leroy Sea, MD  metFORMIN (GLUCOPHAGE) 1000 MG tablet Take 1,000 mg by mouth 2 (two) times daily with a meal.  12/07/14  Yes [provider]  montelukast (SINGULAIR) 10 MG tablet Take 1 tablet (10 mg total) by mouth at bedtime. 06/15/22  Yes Mannam, Praveen, MD  ondansetron (ZOFRAN-ODT) 4 MG disintegrating tablet Take 1 tablet  (4 mg total) by mouth every 8 (eight) hours as needed for nausea or vomiting. Consider taking 30 minutes prior to meals. Patient taking differently: Take 4 mg by mouth as needed for nausea or vomiting. 12/30/22  Yes Gerhard Munch, MD  pantoprazole (PROTONIX) 40 MG tablet Take 1 tablet (40 mg total) by mouth daily. 12/15/20  Yes Dolores Frame, MD  pramipexole (MIRAPEX) 0.5 MG tablet Take 0.5 mg by mouth at bedtime. 11/04/22  Yes [provider]  pravastatin (PRAVACHOL) 20 MG tablet Take 20 mg by mouth every evening.   Yes [provider]  sucralfate (CARAFATE) 1 g tablet Take 1 tablet (1 g total) by mouth 4 (four) times daily -  with meals and at bedtime. 12/30/22  Yes Gerhard Munch, MD  valsartan (DIOVAN) 160 MG tablet Take 160 mg by mouth daily. 11/06/22  Yes [provider]  aspirin EC 81 MG tablet Take 1 tablet (  81 mg total) by mouth daily. Swallow whole. Patient not taking: Reported on 01/06/2023 09/30/20   Jonelle Sidle, MD  linezolid (ZYVOX) 600 MG tablet Take 1 tablet (600 mg total) by mouth every 12 (twelve) hours. Patient not taking: Reported on 01/06/2023 12/13/22   Leroy Sea, MD        Modena Slater, DO  Internal Medicine Resident PGY-2 01/13/2023 6:43 AM

## 2023-01-13 NOTE — Progress Notes (Signed)
SLP Cancellation Note  Patient Details Name: Travis Beck MRN: [id number] DOB: 11-Aug-1945   Cancelled treatment:       Reason Eval/Treat Not Completed: Fatigue/lethargy limiting ability to participate   Pat Velvie Thomaston,M.S., CCC-SLP 01/13/2023, 3:44 PM

## 2023-01-13 NOTE — Progress Notes (Signed)
Patient ID: Travis Beck, male   DOB: 1945/08/29, 77 y.o.   MRN: [id number] S: pt seen and examined while on CRRT.  Tolerating it well.  Labs and orders reviewed and adjustments made accordingly. O:BP 121/85   Pulse 94   Temp 98.5 F (36.9 C) (Oral)   Resp (!) 21   Ht 5\' 5"  (1.651 m)   Wt 73.2 kg   SpO2 94%   BMI 26.85 kg/m   Intake/Output Summary (Last 24 hours) at 01/13/2023 1016 Last data filed at 01/13/2023 1000 Gross per 24 hour  Intake 1145.34 ml  Output 1712.8 ml  Net -567.46 ml   Intake/Output: I/O last 3 completed shifts: In: 1254.3 [I.V.:138.9; NG/GT:1115.3] Out: 1797.8 [Urine:1140]  Intake/Output this shift:  Total I/O In: 270 [NG/GT:270] Out: 325 [Urine:110] Weight change: -0.7 kg Gen: chronically ill-appearing CVS: RRR Resp: scattered rhonchi bilaterally Abd: +BS, soft, NT/ND Ext: trace pretibial edema 1+ scrotal edema  Recent Labs  Lab 01/08/23 0407 01/08/23 1622 01/08/23 1702 01/09/23 0009 01/09/23 0135 01/09/23 1800 01/10/23 0527 01/10/23 1938 01/11/23 0741 01/12/23 0047 01/12/23 1605 01/13/23 0051  NA 129* 126*   < > 128* 130*  --  129*  --  127* 128* 129* 133*  K 3.8 3.9   < > 4.1 3.8  --  4.2  --  4.1 4.1 4.1 3.8  CL 95* 96*  --   --  94*  --  94*  --  95* 95* 98 102  CO2 19* 17*  --   --  19*  --  17*  --  15* 16* 16* 20*  GLUCOSE 70 156*  --   --  180*  --  281*  --  131* 187* 156* 128*  BUN 65* 66*  --   --  68*  --  84*  --  93* 102* 112* 82*  CREATININE 4.19* 4.26*  --   --  4.43*  --  5.14*  --  5.28* 5.37* 5.39* 3.82*  ALBUMIN <1.5* 1.5*  --   --   --   --   --   --   --  <1.5* <1.5* <1.5*  CALCIUM 7.8* 7.8*  --   --  7.4*  --  7.5*  --  7.5* 7.4* 7.6* 7.2*  PHOS 4.0 5.2*  --   --   --  7.4* 8.0* 6.9* 6.9* 6.7* 7.1* 4.3  AST 43* 35  --   --   --   --   --   --   --   --   --   --   ALT 16 15  --   --   --   --   --   --   --   --   --   --    < > = values in this interval not displayed.   Liver Function Tests: Recent Labs   Lab 01/08/23 0407 01/08/23 1622 01/12/23 0047 01/12/23 1605 01/13/23 0051  AST 43* 35  --   --   --   ALT 16 15  --   --   --   ALKPHOS 79 87  --   --   --   BILITOT 0.4 0.5  --   --   --   PROT 6.0* 6.0*  --   --   --   ALBUMIN <1.5* 1.5* <1.5* <1.5* <1.5*   No results for input(s): "LIPASE", "AMYLASE" in the last 168 hours. No results for input(s): "  AMMONIA" in the last 168 hours. CBC: Recent Labs  Lab 01/08/23 0407 01/08/23 1148 01/08/23 1622 01/08/23 1702 01/09/23 0135 01/10/23 0527 01/11/23 0741 01/12/23 0047 01/13/23 0051  WBC 14.9*   < > 17.2*  --  17.6* 10.6* 16.9* 19.7* 18.3*  NEUTROABS 11.6*  --  14.4*  --   --   --   --  18.4*  --   HGB 6.4*   < > 10.0*   < > 7.8* 8.5* 9.6* 8.2* 7.7*  HCT 19.8*   < > 29.6*   < > 23.3* 24.6* 28.3* 24.5* 22.7*  MCV 86.8   < > 83.1  --  85.7 84.5 84.2 83.9 83.2  PLT 649*   < > 611*  --  474* 531* 566* 713* 593*   < > = values in this interval not displayed.   Cardiac Enzymes: No results for input(s): "CKTOTAL", "CKMB", "CKMBINDEX", "TROPONINI" in the last 168 hours. CBG: Recent Labs  Lab 01/12/23 1154 01/12/23 1708 01/12/23 2357 01/13/23 0312 01/13/23 0732  GLUCAP 180* 170* 130* 123* 157*    Iron Studies: No results for input(s): "IRON", "TIBC", "TRANSFERRIN", "FERRITIN" in the last 72 hours. Studies/Results: DG Chest Port 1 View  Result Date: 01/13/2023 CLINICAL DATA:  Respiratory failure EXAM: PORTABLE CHEST 1 VIEW COMPARISON:  Radiograph 01/12/2023 FINDINGS: Feeding tube and central venous line unchanged. Cardiac silhouette is obscured by bilateral airspace disease. Bilateral patchy severe airspace disease is not changed from comparison exam. Trace pleural effusions. No pneumothorax. IMPRESSION: 1. No change in severe bilateral airspace disease. 2. Trace pleural effusions. 3. Stable support apparatus. Electronically Signed   By: Genevive Bi M.D.   On: 01/13/2023 09:37   DG CHEST PORT 1 VIEW  Result Date:  01/12/2023 CLINICAL DATA:  Check central line placement EXAM: PORTABLE CHEST 1 VIEW COMPARISON:  01/11/2023 FINDINGS: Cardiac shadow is stable. Feeding catheter is noted extending into the stomach. Bilateral airspace opacities are again noted. New right jugular temporary dialysis catheter is noted with the tip at the cavoatrial junction. No pneumothorax is noted. IMPRESSION: No pneumothorax following central line placement. Stable appearance of the chest. Electronically Signed   By: Alcide Clever M.D.   On: 01/12/2023 15:43   DG CHEST PORT 1 VIEW  Result Date: 01/11/2023 CLINICAL DATA:  Respiratory failure EXAM: PORTABLE CHEST 1 VIEW COMPARISON:  01/08/2023 FINDINGS: Dose Bilateral diffuse interstitial and alveolar airspace opacities with more focal airspace disease in the right upper lobe. No pleural effusion or pneumothorax. Heart and mediastinal contours are unremarkable. Feeding tube coursing below the diaphragm. No acute osseous abnormality. IMPRESSION: 1. Bilateral diffuse interstitial and alveolar airspace opacities with more focal airspace disease in the right upper lobe concerning for multilobar pneumonia. Electronically Signed   By: Elige Ko M.D.   On: 01/11/2023 17:10    arformoterol  15 mcg Nebulization BID   aspirin  81 mg Per Tube Daily   budesonide (PULMICORT) nebulizer solution  0.5 mg Nebulization BID   Chlorhexidine Gluconate Cloth  6 each Topical Daily   feeding supplement (PROSource TF20)  60 mL Per Tube BID   fluticasone  2 spray Each Nare Daily   guaiFENesin  15 mL Per Tube Q4H   heparin  5,000 Units Subcutaneous Q8H   insulin aspart  0-9 Units Subcutaneous Q4H   lidocaine  1 patch Transdermal Q24H   [START ON 01/14/2023] loratadine  10 mg Per Tube Q48H   methylPREDNISolone (SOLU-MEDROL) injection  80 mg Intravenous Q12H   montelukast  10 mg Per Tube QHS   multivitamin  1 tablet Per Tube QHS   mouth rinse  15 mL Mouth Rinse 4 times per day   pantoprazole (PROTONIX) IV  40 mg  Intravenous QHS   pramipexole  0.5 mg Per Tube QHS   pravastatin  20 mg Per Tube QPM   revefenacin  175 mcg Nebulization Daily   saccharomyces boulardii  250 mg Per Tube BID   senna  1 tablet Per Tube QHS   thiamine  100 mg Per Tube Daily    BMET    Component Value Date/Time   NA 133 (L) 01/13/2023 0051   K 3.8 01/13/2023 0051   CL 102 01/13/2023 0051   CO2 20 (L) 01/13/2023 0051   GLUCOSE 128 (H) 01/13/2023 0051   BUN 82 (H) 01/13/2023 0051   CREATININE 3.82 (H) 01/13/2023 0051   CALCIUM 7.2 (L) 01/13/2023 0051   GFRNONAA 15 (L) 01/13/2023 0051   GFRAA >60 12/15/2019 0812   CBC    Component Value Date/Time   WBC 18.3 (H) 01/13/2023 0051   RBC 2.73 (L) 01/13/2023 0051   HGB 7.7 (L) 01/13/2023 0051   HCT 22.7 (L) 01/13/2023 0051   PLT 593 (H) 01/13/2023 0051   MCV 83.2 01/13/2023 0051   MCH 28.2 01/13/2023 0051   MCHC 33.9 01/13/2023 0051   RDW 15.1 01/13/2023 0051   LYMPHSABS 0.5 (L) 01/12/2023 0047   MONOABS 0.5 01/12/2023 0047   EOSABS 0.0 01/12/2023 0047   BASOSABS 0.0 01/12/2023 0047    Assessment/Plan:  AKI - presumably ischemic ATN in setting of sepsis due to pneumonia and poor po intake. Vanco trough was high at 28.  BUN/Cr continue to climb.  CRRT initiated after right internal jugular catheter placed by PCCM.  Currently off of pressors CRRT prescription:  4k/2.5Ca bath pre and post-filter at 400 mL/hr, dialysate 4K/2.5Ca at 1500 mL/hr, No heparin, UF goal 0-50 but will increase to 50-100 mL/hr given edema.   Avoid nephrotoxic medications including NSAIDs and iodinated intravenous contrast exposure unless the latter is absolutely indicated.  Preferred narcotic agents for pain control are hydromorphone, fentanyl, and methadone. Morphine should not be used. Avoid Baclofen and avoid oral sodium phosphate and magnesium citrate based laxatives / bowel preps. Continue strict Input and Output monitoring. Will monitor the patient closely with you and intervene or adjust  therapy as indicated by changes in clinical status/labs   Sepsis - due to recurrent multifocal pneumonia - possible aspiration.  Currently off of norepinephrine.   Acute hypoxic respiratory failure - required intubation now extubated.  Plan per PCCM    Iron deficiency anemia - continue to follow SIADH - stable serum sodium levels. AGMA - due to #1.  Continue to follow Severe protein malnutrition - alb 1.5.  cortrak and supplements per PCCM. Esophageal dysmotility - speech path following.     Irena Cords, MD [facility]

## 2023-01-14 ENCOUNTER — Inpatient Hospital Stay (HOSPITAL_COMMUNITY): Payer: Medicare Other

## 2023-01-14 DIAGNOSIS — J9601 Acute respiratory failure with hypoxia: Secondary | ICD-10-CM | POA: Diagnosis not present

## 2023-01-14 LAB — RENAL FUNCTION PANEL
Albumin: <1.5 g/dL — ABNORMAL LOW (ref 3.5–5.0)
Albumin: <1.5 g/dL — ABNORMAL LOW (ref 3.5–5.0)
Anion gap: 9 (ref 5–15)
Anion gap: 10 (ref 5–15)
BUN: 49 mg/dL — ABNORMAL HIGH (ref 8–23)
BUN: 39 mg/dL — ABNORMAL HIGH (ref 8–23)
CO2: 23 mmol/L (ref 22–32)
CO2: 24 mmol/L (ref 22–32)
Calcium: 7.3 mg/dL — ABNORMAL LOW (ref 8.9–10.3)
Calcium: 7.6 mg/dL — ABNORMAL LOW (ref 8.9–10.3)
Chloride: 101 mmol/L (ref 98–111)
Chloride: 100 mmol/L (ref 98–111)
Creatinine, Ser: 2.04 mg/dL — ABNORMAL HIGH (ref 0.61–1.24)
Creatinine, Ser: 1.77 mg/dL — ABNORMAL HIGH (ref 0.61–1.24)
GFR, Estimated: 33 mL/min — ABNORMAL LOW (ref >60)
GFR, Estimated: 39 mL/min — ABNORMAL LOW (ref >60)
Glucose, Bld: 248 mg/dL — ABNORMAL HIGH (ref 70–99)
Glucose, Bld: 333 mg/dL — ABNORMAL HIGH (ref 70–99)
Phosphorus: 3 mg/dL (ref 2.5–4.6)
Phosphorus: 2.8 mg/dL (ref 2.5–4.6)
Potassium: 4.8 mmol/L (ref 3.5–5.1)
Potassium: 4.8 mmol/L (ref 3.5–5.1)
Sodium: 133 mmol/L — ABNORMAL LOW (ref 135–145)
Sodium: 134 mmol/L — ABNORMAL LOW (ref 135–145)

## 2023-01-14 LAB — CBC
HCT: 21.8 % — ABNORMAL LOW (ref 39.0–52.0)
Hemoglobin: 7.2 g/dL — ABNORMAL LOW (ref 13.0–17.0)
MCH: 28.3 pg (ref 26.0–34.0)
MCHC: 33 g/dL (ref 30.0–36.0)
MCV: 85.8 fL (ref 80.0–100.0)
Platelets: 560 10*3/uL — ABNORMAL HIGH (ref 150–400)
RBC: 2.54 MIL/uL — ABNORMAL LOW (ref 4.22–5.81)
RDW: 14.9 % (ref 11.5–15.5)
WBC: 16.3 10*3/uL — ABNORMAL HIGH (ref 4.0–10.5)
nRBC: 0 % (ref 0.0–0.2)

## 2023-01-14 LAB — GLUCOSE, CAPILLARY
Glucose-Capillary: 139 mg/dL — ABNORMAL HIGH (ref 70–99)
Glucose-Capillary: 261 mg/dL — ABNORMAL HIGH (ref 70–99)
Glucose-Capillary: 283 mg/dL — ABNORMAL HIGH (ref 70–99)
Glucose-Capillary: 300 mg/dL — ABNORMAL HIGH (ref 70–99)
Glucose-Capillary: 293 mg/dL — ABNORMAL HIGH (ref 70–99)
Glucose-Capillary: 264 mg/dL — ABNORMAL HIGH (ref 70–99)
Glucose-Capillary: 245 mg/dL — ABNORMAL HIGH (ref 70–99)

## 2023-01-14 LAB — MAGNESIUM: Magnesium: 2.5 mg/dL — ABNORMAL HIGH (ref 1.7–2.4)

## 2023-01-14 MED ORDER — SERTRALINE HCL 50 MG PO TABS
50.0000 mg | ORAL_TABLET | Freq: Every day | ORAL | Status: DC
  Administered 2023-01-14: 50 mg via ORAL
  Filled 2023-01-14 (×2): qty 1

## 2023-01-14 MED ORDER — SODIUM CHLORIDE 0.9 % IV SOLN
250.0000 mg | Freq: Every day | INTRAVENOUS | Status: AC
  Administered 2023-01-14 – 2023-01-15 (×2): 250 mg via INTRAVENOUS
  Filled 2023-01-14 (×2): qty 20

## 2023-01-14 MED ORDER — BUPRENORPHINE HCL 2 MG SL SUBL
2.0000 mg | SUBLINGUAL_TABLET | Freq: Two times a day (BID) | SUBLINGUAL | Status: DC | PRN
  Administered 2023-01-14 – 2023-01-15 (×2): 0.5 mg via SUBLINGUAL
  Administered 2023-01-24: 2 mg via SUBLINGUAL
  Filled 2023-01-14 (×4): qty 1

## 2023-01-14 MED ORDER — INSULIN GLARGINE-YFGN 100 UNIT/ML ~~LOC~~ SOLN
5.0000 [IU] | Freq: Two times a day (BID) | SUBCUTANEOUS | Status: DC
  Administered 2023-01-14 – 2023-01-15 (×3): 5 [IU] via SUBCUTANEOUS
  Filled 2023-01-14 (×4): qty 0.05

## 2023-01-14 NOTE — Progress Notes (Signed)
Patient ID: Travis Beck, male   DOB: 04-Mar-1946, 77 y.o.   MRN: [id number] S: Pt feeling better this morning.  Pt was seen and examined while on CRRT.  Labs and orders reviewed accordingly.  O:BP (!) 141/92   Pulse 91   Temp 98.4 F (36.9 C) (Oral)   Resp 15   Ht 5\' 5"  (1.651 m)   Wt 70.8 kg   SpO2 94%   BMI 25.97 kg/m   Intake/Output Summary (Last 24 hours) at 01/14/2023 1014 Last data filed at 01/14/2023 0800 Gross per 24 hour  Intake 985 ml  Output 2928 ml  Net -1943 ml   Intake/Output: I/O last 3 completed shifts: In: 1739.7 [NG/GT:1739.7] Out: 4220.1 [Urine:905]  Intake/Output this shift:  Total I/O In: 40 [NG/GT:40] Out: 30 [Urine:30] Weight change: -2.4 kg Gen: fatigued but in NAD CVS: RRR Resp: occ rhonchi Abd: +BS, soft, NT/ND Ext: trace pretibial edema, 1+ scrotal/penile edema  Recent Labs  Lab 01/08/23 0407 01/08/23 1622 01/08/23 1702 01/10/23 0527 01/10/23 1938 01/11/23 0741 01/12/23 0047 01/12/23 1605 01/13/23 0051 01/13/23 1552 01/14/23 0210  NA 129* 126*   < > 129*  --  127* 128* 129* 133* 132* 133*  K 3.8 3.9   < > 4.2  --  4.1 4.1 4.1 3.8 4.6 4.8  CL 95* 96*   < > 94*  --  95* 95* 98 102 101 101  CO2 19* 17*   < > 17*  --  15* 16* 16* 20* 22 23  GLUCOSE 70 156*   < > 281*  --  131* 187* 156* 128* 219* 248*  BUN 65* 66*   < > 84*  --  93* 102* 112* 82* 59* 49*  CREATININE 4.19* 4.26*   < > 5.14*  --  5.28* 5.37* 5.39* 3.82* 2.61* 2.04*  ALBUMIN <1.5* 1.5*  --   --   --   --  <1.5* <1.5* <1.5* <1.5* <1.5*  CALCIUM 7.8* 7.8*   < > 7.5*  --  7.5* 7.4* 7.6* 7.2* 7.1* 7.3*  PHOS 4.0 5.2*   < > 8.0* 6.9* 6.9* 6.7* 7.1* 4.3 3.4 3.0  AST 43* 35  --   --   --   --   --   --   --   --   --   ALT 16 15  --   --   --   --   --   --   --   --   --    < > = values in this interval not displayed.   Liver Function Tests: Recent Labs  Lab 01/08/23 0407 01/08/23 1622 01/12/23 0047 01/13/23 0051 01/13/23 1552 01/14/23 0210  AST 43* 35  --   --    --   --   ALT 16 15  --   --   --   --   ALKPHOS 79 87  --   --   --   --   BILITOT 0.4 0.5  --   --   --   --   PROT 6.0* 6.0*  --   --   --   --   ALBUMIN <1.5* 1.5*   < > <1.5* <1.5* <1.5*   < > = values in this interval not displayed.   No results for input(s): "LIPASE", "AMYLASE" in the last 168 hours. No results for input(s): "AMMONIA" in the last 168 hours. CBC: Recent Labs  Lab 01/08/23 0407 01/08/23 1148 01/08/23  1622 01/08/23 1702 01/10/23 0527 01/11/23 0741 01/12/23 0047 01/13/23 0051 01/14/23 0210  WBC 14.9*   < > 17.2*   < > 10.6* 16.9* 19.7* 18.3* 16.3*  NEUTROABS 11.6*  --  14.4*  --   --   --  18.4*  --   --   HGB 6.4*   < > 10.0*   < > 8.5* 9.6* 8.2* 7.7* 7.2*  HCT 19.8*   < > 29.6*   < > 24.6* 28.3* 24.5* 22.7* 21.8*  MCV 86.8   < > 83.1   < > 84.5 84.2 83.9 83.2 85.8  PLT 649*   < > 611*   < > 531* 566* 713* 593* 560*   < > = values in this interval not displayed.   Cardiac Enzymes: No results for input(s): "CKTOTAL", "CKMB", "CKMBINDEX", "TROPONINI" in the last 168 hours. CBG: Recent Labs  Lab 01/13/23 1513 01/13/23 1931 01/13/23 2312 01/14/23 0333 01/14/23 0723  GLUCAP 204* 224* 220* 261* 283*    Iron Studies: No results for input(s): "IRON", "TIBC", "TRANSFERRIN", "FERRITIN" in the last 72 hours. Studies/Results: DG Chest Port 1 View  Result Date: 01/13/2023 CLINICAL DATA:  Respiratory failure EXAM: PORTABLE CHEST 1 VIEW COMPARISON:  Radiograph 01/12/2023 FINDINGS: Feeding tube and central venous line unchanged. Cardiac silhouette is obscured by bilateral airspace disease. Bilateral patchy severe airspace disease is not changed from comparison exam. Trace pleural effusions. No pneumothorax. IMPRESSION: 1. No change in severe bilateral airspace disease. 2. Trace pleural effusions. 3. Stable support apparatus. Electronically Signed   By: Genevive Bi M.D.   On: 01/13/2023 09:37   DG CHEST PORT 1 VIEW  Result Date: 01/12/2023 CLINICAL DATA:   Check central line placement EXAM: PORTABLE CHEST 1 VIEW COMPARISON:  01/11/2023 FINDINGS: Cardiac shadow is stable. Feeding catheter is noted extending into the stomach. Bilateral airspace opacities are again noted. New right jugular temporary dialysis catheter is noted with the tip at the cavoatrial junction. No pneumothorax is noted. IMPRESSION: No pneumothorax following central line placement. Stable appearance of the chest. Electronically Signed   By: Alcide Clever M.D.   On: 01/12/2023 15:43    arformoterol  15 mcg Nebulization BID   aspirin  81 mg Per Tube Daily   budesonide (PULMICORT) nebulizer solution  0.5 mg Nebulization BID   Chlorhexidine Gluconate Cloth  6 each Topical Daily   feeding supplement (PROSource TF20)  60 mL Per Tube BID   fluticasone  2 spray Each Nare Daily   heparin  5,000 Units Subcutaneous Q8H   insulin aspart  0-9 Units Subcutaneous Q4H   insulin glargine-yfgn  5 Units Subcutaneous BID   lidocaine  1 patch Transdermal Q24H   loratadine  10 mg Per Tube Q48H   methylPREDNISolone (SOLU-MEDROL) injection  80 mg Intravenous Q12H   montelukast  10 mg Per Tube QHS   multivitamin  1 tablet Per Tube QHS   mouth rinse  15 mL Mouth Rinse 4 times per day   pantoprazole (PROTONIX) IV  40 mg Intravenous QHS   pramipexole  0.5 mg Per Tube QHS   pravastatin  20 mg Per Tube QPM   revefenacin  175 mcg Nebulization Daily   saccharomyces boulardii  250 mg Per Tube BID   senna  1 tablet Per Tube QHS   thiamine  100 mg Per Tube Daily    BMET    Component Value Date/Time   NA 133 (L) 01/14/2023 0210   K 4.8 01/14/2023 0210  CL 101 01/14/2023 0210   CO2 23 01/14/2023 0210   GLUCOSE 248 (H) 01/14/2023 0210   BUN 49 (H) 01/14/2023 0210   CREATININE 2.04 (H) 01/14/2023 0210   CALCIUM 7.3 (L) 01/14/2023 0210   GFRNONAA 33 (L) 01/14/2023 0210   GFRAA >60 12/15/2019 0812   CBC    Component Value Date/Time   WBC 16.3 (H) 01/14/2023 0210   RBC 2.54 (L) 01/14/2023 0210    HGB 7.2 (L) 01/14/2023 0210   HCT 21.8 (L) 01/14/2023 0210   PLT 560 (H) 01/14/2023 0210   MCV 85.8 01/14/2023 0210   MCH 28.3 01/14/2023 0210   MCHC 33.0 01/14/2023 0210   RDW 14.9 01/14/2023 0210   LYMPHSABS 0.5 (L) 01/12/2023 0047   MONOABS 0.5 01/12/2023 0047   EOSABS 0.0 01/12/2023 0047   BASOSABS 0.0 01/12/2023 0047    Assessment/Plan:  AKI - presumably ischemic ATN in setting of sepsis due to pneumonia and poor po intake. Vanco trough was high at 28.  BUN/Cr continue to climb.  CRRT initiated after right internal jugular catheter placed by PCCM.  Currently off of pressors CRRT prescription:  4k/2.5Ca bath pre and post-filter at 400 mL/hr, dialysate 4K/2.5Ca at 1500 mL/hr, No heparin, UF goal 50-100 mL/hr given edema.   Avoid nephrotoxic medications including NSAIDs and iodinated intravenous contrast exposure unless the latter is absolutely indicated.  Preferred narcotic agents for pain control are hydromorphone, fentanyl, and methadone. Morphine should not be used. Avoid Baclofen and avoid oral sodium phosphate and magnesium citrate based laxatives / bowel preps. Continue strict Input and Output monitoring. Will monitor the patient closely with you and intervene or adjust therapy as indicated by changes in clinical status/labs   Sepsis - due to recurrent multifocal pneumonia - possible aspiration.  Currently off of norepinephrine.  Will send off ANCA and Anti-GBM to r/o vasculitis.  Acute hypoxic respiratory failure - required intubation now extubated.  Plan per PCCM Iron deficiency anemia - continue to follow SIADH - stable serum sodium levels. AGMA - due to #1.  Continue to follow Severe protein malnutrition - alb 1.5.  cortrak and supplements per PCCM. Esophageal dysmotility - speech path following.   Irena Cords, MD [facility]

## 2023-01-14 NOTE — Progress Notes (Signed)
Pharmacy Consult Note - IV Iron  77 yom presenting with acute hypoxic respiratory failure with multilobar pneumonitis, AKI now requiring CRRT. Hg down to 7.2 today. Fe panel from 7/1 resulted with low Fe <5, Ferritin 186 (Tsat not calculated). Pharmacy consulted to dose IV iron.   Plan: Ferric gluconate 250mg  IV daily x 2 doses Monitor Hg trend, repeat Fe panel as appropriate   Leia Alf, PharmD, BCPS Please check AMION for all [facility] contact numbers Clinical Pharmacist 01/14/2023 9:37 AM

## 2023-01-14 NOTE — Progress Notes (Addendum)
NAME:  Travis Beck, MRN:  [id number], DOB:  [birth year], LOS: 8 ADMISSION DATE:  01/06/2023, CONSULTATION DATE:  01/08/2023 REFERRING MD:  Marland Mcalpine, CHIEF COMPLAINT:  Aspiration Pneumonia, nausea and vomiting   History of Present Illness:  77 y.o. male with medical history significant of IIDM, HTN, SIDAH, COPD, ( Former smoker quit 1994 with a 66 pack year smoking history diabetic neuropathy, recurrent pneumonia presented 01/06/2023 with worsening of nauseous vomiting cough and shortness of breath.  Patient was recently hospitalized 5/23-5/26 for multifocal MRSA pneumonia  and septic shock requiring vasopressor on admission-stabilized and subsequently discharged home on Augmentin/doxycycline . Pt returned to [facility] 12/07/2022 with fever of 103.1 and ongoing left sided pleuritic pain. He has a small effusion that was not big enough to tap.  He was admitted for incompletely treated PNA-as sputum cultures finalized postdischarge, and Doxycycline did not cover MRSA. He was treated with Zyvox, and discharged home on Zyvox p.o on 12/13/2022. Initially his breathing symptoms improved with Zyvox however patient continued to experience frequent nausea, vomiting and cough. He presented to the ED 6/29 as these symptoms were worsening.  In the ED he was mildly febrile at 99.6, He had  tachycardia, oxygen sats were 93% on 3 L. CXR again showed a multifocal pneumonia, right sided. Na 128/ Creatinine of 4.2, BUN 66 and WBC of 20. His HGB was 8.7.Respiratory Panel was negative. Lactic Acid trend since admission 2.7>>2.8>>1,3 Pulmonary was  asked to consult for recurrent vs slow to resolve pneumonia and hypoxemia . Of note , patient is followed in the [facility]  Pulmonary clinic by Dr. Isaiah Serge and Rubye Oaks NP.  Per family patient has had about a 15 pound weight loss since the end of May 24 when pneumonia was initially diagnosed.  Pertinent  Medical History  Arthritis, Asthma, HTN, DM type  2  Significant Hospital Events: Including procedures, antibiotic start and stop dates in addition to other pertinent events   5/23-5/26 hospitalization for septic shock from multilobar PNA 5/30 -6/5 fever 103 F at home-ongoing left-sided pleuritic chest pain 6/29 Recurrent pneumonitis 7/01 intubated 7/03 extubated 7/04 complete ABx 7/05 start CRRT, off pressors 7/06 start steroids  Interim History / Subjective:  More alert.  O2 needs improved.  Tolerating fluid removal with CRRT.  Objective   BP 120/68   Pulse 82   Temp 98.4 F (36.9 C) (Oral)   Resp 17   Ht 5\' 5"  (1.651 m)   Wt 70.8 kg   SpO2 94%   BMI 25.97 kg/m   Examination:  General - alert Eyes - pupils reactive ENT - no sinus tenderness, no stridor Cardiac - regular rate/rhythm, no murmur Chest - better air movement, scattered rhonchi Abdomen - soft, non tender, + bowel sounds Extremities - no cyanosis, clubbing, or edema Skin - no rashes Neuro - normal strength, moves extremities, follows commands  Resolved Hospital Problem list     Assessment & Plan:   Acute hypoxic respiratory failure with multilobar pneumonitis. - no improvement with ABx or fluid removal - O2 needs improved after starting steroids and CRRT - continue solumedrol 80 mg q12h - f/u CXR intermittently - goal SpO2 > 88%  Asthma. - PFT from September 2022 was normal - continue yupelri, brovana, pulmicort, singulair - prn albuterol   AKI from ischemic ATN in setting of sepsis. Anion gap, Non anion gap metabolic acidosis. - continue volume removal with CRRT   SIADH. - f/u Na level   Esophageal dysmotility. - concern  he could have recurrent aspiration pneumonitis - will need further GI assessment when respiratory status more stable   Severe protein calorie malnutrition. - tube feeds   IDA, anemia of critical illness. - f/u CBC - pharmacy to dose IV iron - transfuse for Hb < 7   DM type 2 poorly controlled with steroid  induced hyperglycemia. - SSI - add semglee 5 units bid   Goals of care. - palliative care consulted  Acute metabolic encephalopathy 2nd to hypoxia and renal failure. Chronic back pain, Depression, Anxiety. - continue pramipexole - prn xanax, neurontin - change subutex to 2 mg SL BID prn  Hx of HLD. - continue pravachol  Best Practice (right click and "Reselect all SmartList Selections" daily)   Diet/type: tube feeds DVT prophylaxis: SQ heparin GI prophylaxis: protonix Lines: Rt internal jugular HD catheter Foley:  Yes, still needed Code Status:  full code Last date of multidisciplinary goals of care discussion (updated family at bedside)  Labs       Latest Ref Rng & Units 01/14/2023    2:10 AM 01/13/2023    3:52 PM 01/13/2023   12:51 AM  CMP  Glucose 70 - 99 mg/dL 161  096  045   BUN 8 - 23 mg/dL 49  59  82   Creatinine 0.61 - 1.24 mg/dL 4.09  8.11  9.14   Sodium 135 - 145 mmol/L 133  132  133   Potassium 3.5 - 5.1 mmol/L 4.8  4.6  3.8   Chloride 98 - 111 mmol/L 101  101  102   CO2 22 - 32 mmol/L 23  22  20    Calcium 8.9 - 10.3 mg/dL 7.3  7.1  7.2       Latest Ref Rng & Units 01/14/2023    2:10 AM 01/13/2023   12:51 AM 01/12/2023   12:47 AM  CBC  WBC 4.0 - 10.5 K/uL 16.3  18.3  19.7   Hemoglobin 13.0 - 17.0 g/dL 7.2  7.7  8.2   Hematocrit 39.0 - 52.0 % 21.8  22.7  24.5   Platelets 150 - 400 K/uL 560  593  713     ABG    Component Value Date/Time   PHART 7.213 (L) 01/09/2023 0009   PCO2ART 52.9 (H) 01/09/2023 0009   PO2ART 170 (H) 01/09/2023 0009   HCO3 22.5 01/13/2023 0928   TCO2 23 01/09/2023 0009   ACIDBASEDEF 2.3 (H) 01/13/2023 0928   O2SAT 79.5 01/13/2023 0928    CBG (last 3)  Recent Labs    01/13/23 2312 01/14/23 0333 01/14/23 0723  GLUCAP 220* 261* 283*    Critical Care time: 36 minutes  Coralyn Helling, MD [facility] Pager - [phone] or [phone] 01/14/2023, 8:24 AM

## 2023-01-15 ENCOUNTER — Ambulatory Visit: Payer: Medicare Other | Admitting: Pulmonary Disease

## 2023-01-15 DIAGNOSIS — E440 Moderate protein-calorie malnutrition: Secondary | ICD-10-CM | POA: Diagnosis not present

## 2023-01-15 DIAGNOSIS — J9601 Acute respiratory failure with hypoxia: Secondary | ICD-10-CM | POA: Diagnosis not present

## 2023-01-15 DIAGNOSIS — R1319 Other dysphagia: Secondary | ICD-10-CM | POA: Diagnosis not present

## 2023-01-15 DIAGNOSIS — N179 Acute kidney failure, unspecified: Secondary | ICD-10-CM

## 2023-01-15 DIAGNOSIS — J189 Pneumonia, unspecified organism: Secondary | ICD-10-CM | POA: Diagnosis not present

## 2023-01-15 LAB — RENAL FUNCTION PANEL
Albumin: <1.5 g/dL — ABNORMAL LOW (ref 3.5–5.0)
Albumin: 1.7 g/dL — ABNORMAL LOW (ref 3.5–5.0)
Anion gap: 9 (ref 5–15)
Anion gap: 11 (ref 5–15)
BUN: 40 mg/dL — ABNORMAL HIGH (ref 8–23)
BUN: 33 mg/dL — ABNORMAL HIGH (ref 8–23)
CO2: 24 mmol/L (ref 22–32)
CO2: 23 mmol/L (ref 22–32)
Calcium: 7.3 mg/dL — ABNORMAL LOW (ref 8.9–10.3)
Calcium: 7.7 mg/dL — ABNORMAL LOW (ref 8.9–10.3)
Chloride: 100 mmol/L (ref 98–111)
Chloride: 98 mmol/L (ref 98–111)
Creatinine, Ser: 1.61 mg/dL — ABNORMAL HIGH (ref 0.61–1.24)
Creatinine, Ser: 1.42 mg/dL — ABNORMAL HIGH (ref 0.61–1.24)
GFR, Estimated: 43 mL/min — ABNORMAL LOW (ref >60)
GFR, Estimated: 50 mL/min — ABNORMAL LOW (ref >60)
Glucose, Bld: 291 mg/dL — ABNORMAL HIGH (ref 70–99)
Glucose, Bld: 348 mg/dL — ABNORMAL HIGH (ref 70–99)
Phosphorus: 2.7 mg/dL (ref 2.5–4.6)
Phosphorus: 2.3 mg/dL — ABNORMAL LOW (ref 2.5–4.6)
Potassium: 4.5 mmol/L (ref 3.5–5.1)
Potassium: 4.8 mmol/L (ref 3.5–5.1)
Sodium: 133 mmol/L — ABNORMAL LOW (ref 135–145)
Sodium: 132 mmol/L — ABNORMAL LOW (ref 135–145)

## 2023-01-15 LAB — LACTATE DEHYDROGENASE: LDH: 259 U/L — ABNORMAL HIGH (ref 98–192)

## 2023-01-15 LAB — CBC
HCT: 21.3 % — ABNORMAL LOW (ref 39.0–52.0)
Hemoglobin: 7 g/dL — ABNORMAL LOW (ref 13.0–17.0)
MCH: 28.7 pg (ref 26.0–34.0)
MCHC: 32.9 g/dL (ref 30.0–36.0)
MCV: 87.3 fL (ref 80.0–100.0)
Platelets: 614 10*3/uL — ABNORMAL HIGH (ref 150–400)
RBC: 2.44 MIL/uL — ABNORMAL LOW (ref 4.22–5.81)
RDW: 14.8 % (ref 11.5–15.5)
WBC: 15 10*3/uL — ABNORMAL HIGH (ref 4.0–10.5)
nRBC: 0 % (ref 0.0–0.2)

## 2023-01-15 LAB — C4 COMPLEMENT: Complement C4, Body Fluid: 24 mg/dL (ref 12–38)

## 2023-01-15 LAB — GLUCOSE, CAPILLARY
Glucose-Capillary: 263 mg/dL — ABNORMAL HIGH (ref 70–99)
Glucose-Capillary: 281 mg/dL — ABNORMAL HIGH (ref 70–99)
Glucose-Capillary: 353 mg/dL — ABNORMAL HIGH (ref 70–99)
Glucose-Capillary: 294 mg/dL — ABNORMAL HIGH (ref 70–99)
Glucose-Capillary: 313 mg/dL — ABNORMAL HIGH (ref 70–99)
Glucose-Capillary: 168 mg/dL — ABNORMAL HIGH (ref 70–99)

## 2023-01-15 LAB — GLOMERULAR BASEMENT MEMBRANE ANTIBODIES: GBM Ab: <0.2 units (ref 0.0–0.9)

## 2023-01-15 LAB — MAGNESIUM: Magnesium: 2.4 mg/dL (ref 1.7–2.4)

## 2023-01-15 LAB — C3 COMPLEMENT: C3 Complement: 126 mg/dL (ref 82–167)

## 2023-01-15 MED ORDER — PROSOURCE TF20 ENFIT COMPATIBL EN LIQD
60.0000 mL | Freq: Every day | ENTERAL | Status: DC
  Administered 2023-01-16: 60 mL
  Filled 2023-01-15: qty 60

## 2023-01-15 MED ORDER — INSULIN GLARGINE-YFGN 100 UNIT/ML ~~LOC~~ SOLN
10.0000 [IU] | Freq: Two times a day (BID) | SUBCUTANEOUS | Status: DC
  Administered 2023-01-15 – 2023-01-24 (×17): 10 [IU] via SUBCUTANEOUS
  Filled 2023-01-15 (×23): qty 0.1

## 2023-01-15 MED ORDER — BISACODYL 10 MG RE SUPP
10.0000 mg | Freq: Once | RECTAL | Status: AC
  Administered 2023-01-15: 10 mg via RECTAL
  Filled 2023-01-15: qty 1

## 2023-01-15 MED ORDER — SERTRALINE HCL 50 MG PO TABS
50.0000 mg | ORAL_TABLET | Freq: Every day | ORAL | Status: DC
  Administered 2023-01-15: 50 mg

## 2023-01-15 MED ORDER — DOCUSATE SODIUM 50 MG/5ML PO LIQD
100.0000 mg | Freq: Two times a day (BID) | ORAL | Status: DC
  Administered 2023-01-15 – 2023-01-24 (×12): 100 mg
  Filled 2023-01-15 (×15): qty 10

## 2023-01-15 MED ORDER — INSULIN ASPART 100 UNIT/ML IJ SOLN
0.0000 [IU] | INTRAMUSCULAR | Status: DC
  Administered 2023-01-15: 11 [IU] via SUBCUTANEOUS
  Administered 2023-01-15: 7 [IU] via SUBCUTANEOUS
  Administered 2023-01-15: 15 [IU] via SUBCUTANEOUS
  Administered 2023-01-16 (×2): 4 [IU] via SUBCUTANEOUS
  Administered 2023-01-16: 7 [IU] via SUBCUTANEOUS
  Administered 2023-01-16: 4 [IU] via SUBCUTANEOUS
  Administered 2023-01-16: 11 [IU] via SUBCUTANEOUS
  Administered 2023-01-16 – 2023-01-17 (×4): 7 [IU] via SUBCUTANEOUS
  Administered 2023-01-17 – 2023-01-18 (×3): 3 [IU] via SUBCUTANEOUS
  Administered 2023-01-18 – 2023-01-19 (×2): 4 [IU] via SUBCUTANEOUS
  Administered 2023-01-19 – 2023-01-20 (×5): 7 [IU] via SUBCUTANEOUS
  Administered 2023-01-20: 4 [IU] via SUBCUTANEOUS
  Administered 2023-01-20 (×2): 7 [IU] via SUBCUTANEOUS
  Administered 2023-01-20: 11 [IU] via SUBCUTANEOUS
  Administered 2023-01-21: 4 [IU] via SUBCUTANEOUS
  Administered 2023-01-21: 7 [IU] via SUBCUTANEOUS
  Administered 2023-01-21: 3 [IU] via SUBCUTANEOUS
  Administered 2023-01-21: 7 [IU] via SUBCUTANEOUS
  Administered 2023-01-21: 4 [IU] via SUBCUTANEOUS
  Administered 2023-01-21: 3 [IU] via SUBCUTANEOUS
  Administered 2023-01-22: 11 [IU] via SUBCUTANEOUS
  Administered 2023-01-22 (×2): 4 [IU] via SUBCUTANEOUS
  Administered 2023-01-22: 7 [IU] via SUBCUTANEOUS
  Administered 2023-01-22: 4 [IU] via SUBCUTANEOUS
  Administered 2023-01-22: 7 [IU] via SUBCUTANEOUS
  Administered 2023-01-23: 4 [IU] via SUBCUTANEOUS
  Administered 2023-01-23: 7 [IU] via SUBCUTANEOUS
  Administered 2023-01-23: 15 [IU] via SUBCUTANEOUS
  Administered 2023-01-23: 3 [IU] via SUBCUTANEOUS
  Administered 2023-01-23: 7 [IU] via SUBCUTANEOUS
  Administered 2023-01-24: 4 [IU] via SUBCUTANEOUS

## 2023-01-15 MED ORDER — INSULIN GLARGINE-YFGN 100 UNIT/ML ~~LOC~~ SOLN
5.0000 [IU] | Freq: Once | SUBCUTANEOUS | Status: AC
  Administered 2023-01-15: 5 [IU] via SUBCUTANEOUS
  Filled 2023-01-15: qty 0.05

## 2023-01-15 MED ORDER — DOCUSATE SODIUM 283 MG RE ENEM
1.0000 | ENEMA | Freq: Once | RECTAL | Status: DC
  Filled 2023-01-15: qty 1

## 2023-01-15 MED ORDER — POLYETHYLENE GLYCOL 3350 17 G PO PACK
17.0000 g | PACK | Freq: Two times a day (BID) | ORAL | Status: DC
  Administered 2023-01-15 – 2023-01-17 (×4): 17 g
  Filled 2023-01-15 (×6): qty 1

## 2023-01-15 MED ORDER — GLUCERNA 1.5 CAL PO LIQD
1000.0000 mL | ORAL | Status: DC
  Administered 2023-01-15 (×2): 1000 mL
  Filled 2023-01-15 (×2): qty 1000

## 2023-01-15 MED ORDER — METHYLPREDNISOLONE SODIUM SUCC 125 MG IJ SOLR
80.0000 mg | Freq: Every day | INTRAMUSCULAR | Status: DC
  Administered 2023-01-16: 80 mg via INTRAVENOUS
  Filled 2023-01-15: qty 2

## 2023-01-15 MED ORDER — POLYETHYLENE GLYCOL 3350 17 G PO PACK: 17.0000 g | PACK | Freq: Every day | ORAL | Status: DC

## 2023-01-15 NOTE — Progress Notes (Signed)
Speech Language Pathology Treatment: Dysphagia  Patient Details Name: Travis Beck MRN: [id number] DOB: 06-May-1946 Today's Date: 01/15/2023 Time: 6295-2841 SLP Time Calculation (min) (ACUTE ONLY): 21 min  Assessment / Plan / Recommendation Clinical Impression  Pt is alert, voice is clear/strong. Sitting in recliner after PT with friends/family at the bedside. He is on 30L HHFNC, CRRT. Remains confused but followed directions well. He demonstrated adequate attention and was able to drink water from a straw and eat several bites of applesauce with no overt s/s of aspiration, no distress. There were no indications of discoordination of swallow /respiratory cycles. Recommend allowing sips of thin liquid from straw and purees (pudding, applesauce) from floor stock. If pt coughs during PO intake, please discontinue. SLP will  follow.   HPI HPI: Patient is a 77 y.o. male with PMH: HTN, DM-2, COPD, recurrent PNA, esophageal dysphagia (has had EGD's with dilation in 2020 and most recently in 2022). He presented to the hospital on 01/06/2023 SOB, Rt flank and back pain with multilobar PNA. He had MBS with SLP on 01/07/23 which reported a primary esophageal based dysphagia and no further intervention needed.  7/1 intubated due to respiratory distress. Extubated 01/10/23. Swallow re-evaluated 7/3 with recs for NPO given tenuous respiratory status. AKI progressing to require CRRT 7/5. GI was consulted as well and per GI note from 01/09/23, with recommendation for outpatient barium swallow and esophageal manometry due to concern for esophageal dysmotility as well as possible EGD which could be done when his respiratory issues have improved.      SLP Plan  Continue with current plan of care      Recommendations for follow up therapy are one component of a multi-disciplinary discharge planning process, led by the attending physician.  Recommendations may be updated based on patient status, additional functional  criteria and insurance authorization.    Recommendations  Diet recommendations:  (purees and thin liquids from floor stock) Liquids provided via: Cup;Straw Medication Administration: Via alternative means (or crush with puree) Supervision: Trained caregiver to feed patient Compensations: Slow rate;Minimize environmental distractions Postural Changes and/or Swallow Maneuvers: Seated upright 90 degrees                              Continue with current plan of care   Denijah Karrer L. Samson Frederic, MA CCC/SLP Clinical Specialist - Acute Care SLP Acute Rehabilitation Services Office number [phone]   Blenda Mounts [name]  01/15/2023, 11:07 AM

## 2023-01-15 NOTE — Progress Notes (Signed)
Patient ID: Travis Beck, male   DOB: 1945/12/20, 77 y.o.   MRN: [id number] S: Up in chair visiting with 2 sisters and niece.  Feeling improved, no new issues. Pt was seen and examined while on CRRT.  Labs and orders reviewed accordingly.  O:BP 130/77   Pulse 86   Temp 98.4 F (36.9 C) (Axillary)   Resp 17   Ht 5\' 5"  (1.651 m)   Wt 69.4 kg   SpO2 91%   BMI 25.46 kg/m   Intake/Output Summary (Last 24 hours) at 01/15/2023 1220 Last data filed at 01/15/2023 1000 Gross per 24 hour  Intake 1412.5 ml  Output 3204.7 ml  Net -1792.2 ml    Intake/Output: 1.5 / 3.9 (UOP , UF 3.8L) Gen: fatigued but in NAD CVS: RRR Resp: occ rhonchi Abd: +BS, soft, NT/ND Ext: trace pretibial edema  Recent Labs  Lab 01/08/23 1622 01/08/23 1702 01/12/23 0047 01/12/23 1605 01/13/23 0051 01/13/23 1552 01/14/23 0210 01/14/23 1527 01/15/23 0059  NA 126*   < > 128* 129* 133* 132* 133* 134* 133*  K 3.9   < > 4.1 4.1 3.8 4.6 4.8 4.8 4.5  CL 96*   < > 95* 98 102 101 101 100 100  CO2 17*   < > 16* 16* 20* 22 23 24 24   GLUCOSE 156*   < > 187* 156* 128* 219* 248* 333* 291*  BUN 66*   < > 102* 112* 82* 59* 49* 39* 40*  CREATININE 4.26*   < > 5.37* 5.39* 3.82* 2.61* 2.04* 1.77* 1.61*  ALBUMIN 1.5*  --  <1.5* <1.5* <1.5* <1.5* <1.5* <1.5* <1.5*  CALCIUM 7.8*   < > 7.4* 7.6* 7.2* 7.1* 7.3* 7.6* 7.3*  PHOS 5.2*   < > 6.7* 7.1* 4.3 3.4 3.0 2.8 2.7  AST 35  --   --   --   --   --   --   --   --   ALT 15  --   --   --   --   --   --   --   --    < > = values in this interval not displayed.    Liver Function Tests: Recent Labs  Lab 01/08/23 1622 01/12/23 0047 01/14/23 0210 01/14/23 1527 01/15/23 0059  AST 35  --   --   --   --   ALT 15  --   --   --   --   ALKPHOS 87  --   --   --   --   BILITOT 0.5  --   --   --   --   PROT 6.0*  --   --   --   --   ALBUMIN 1.5*   < > <1.5* <1.5* <1.5*   < > = values in this interval not displayed.    No results for input(s): "LIPASE", "AMYLASE" in the  last 168 hours. No results for input(s): "AMMONIA" in the last 168 hours. CBC: Recent Labs  Lab 01/08/23 1622 01/08/23 1702 01/11/23 0741 01/12/23 0047 01/13/23 0051 01/14/23 0210 01/15/23 0059  WBC 17.2*   < > 16.9* 19.7* 18.3* 16.3* 15.0*  NEUTROABS 14.4*  --   --  18.4*  --   --   --   HGB 10.0*   < > 9.6* 8.2* 7.7* 7.2* 7.0*  HCT 29.6*   < > 28.3* 24.5* 22.7* 21.8* 21.3*  MCV 83.1   < > 84.2 83.9 83.2 85.8  87.3  PLT 611*   < > 566* 713* 593* 560* 614*   < > = values in this interval not displayed.    Cardiac Enzymes: No results for input(s): "CKTOTAL", "CKMB", "CKMBINDEX", "TROPONINI" in the last 168 hours. CBG: Recent Labs  Lab 01/14/23 1911 01/14/23 2308 01/15/23 0301 01/15/23 0714 01/15/23 1106  GLUCAP 264* 245* 263* 281* 353*     Iron Studies: No results for input(s): "IRON", "TIBC", "TRANSFERRIN", "FERRITIN" in the last 72 hours. Studies/Results: DG Chest Port 1 View  Result Date: 01/14/2023 CLINICAL DATA:  Respiratory failure. EXAM: PORTABLE CHEST 1 VIEW COMPARISON:  01/13/2023 FINDINGS: Diffuse bilateral airspace disease is similar in the interval. Cardiopericardial silhouette is at upper limits of normal for size. A feeding tube passes into the stomach although the distal tip position is not included on the film. Right IJ central line tip overlies the distal SVC level. Telemetry leads overlie the chest. IMPRESSION: No substantial interval change in diffuse bilateral airspace disease. Electronically Signed   By: Kennith Center M.D.   On: 01/14/2023 10:30    arformoterol  15 mcg Nebulization BID   aspirin  81 mg Per Tube Daily   budesonide (PULMICORT) nebulizer solution  0.5 mg Nebulization BID   Chlorhexidine Gluconate Cloth  6 each Topical Daily   docusate  100 mg Per Tube BID   feeding supplement (GLUCERNA 1.5 CAL)  1,000 mL Per Tube Q24H   [START ON 01/16/2023] feeding supplement (PROSource TF20)  60 mL Per Tube Daily   fluticasone  2 spray Each Nare Daily    heparin  5,000 Units Subcutaneous Q8H   insulin aspart  0-20 Units Subcutaneous Q4H   insulin glargine-yfgn  10 Units Subcutaneous BID   insulin glargine-yfgn  5 Units Subcutaneous Once   lidocaine  1 patch Transdermal Q24H   loratadine  10 mg Per Tube Q48H   methylPREDNISolone (SOLU-MEDROL) injection  80 mg Intravenous Q12H   [START ON 01/16/2023] methylPREDNISolone (SOLU-MEDROL) injection  80 mg Intravenous Daily   montelukast  10 mg Per Tube QHS   multivitamin  1 tablet Per Tube QHS   mouth rinse  15 mL Mouth Rinse 4 times per day   pantoprazole (PROTONIX) IV  40 mg Intravenous QHS   polyethylene glycol  17 g Per Tube BID   pramipexole  0.5 mg Per Tube QHS   pravastatin  20 mg Per Tube QPM   revefenacin  175 mcg Nebulization Daily   saccharomyces boulardii  250 mg Per Tube BID   senna  1 tablet Per Tube QHS   sertraline  50 mg Per Tube Daily    BMET    Component Value Date/Time   NA 133 (L) 01/15/2023 0059   K 4.5 01/15/2023 0059   CL 100 01/15/2023 0059   CO2 24 01/15/2023 0059   GLUCOSE 291 (H) 01/15/2023 0059   BUN 40 (H) 01/15/2023 0059   CREATININE 1.61 (H) 01/15/2023 0059   CALCIUM 7.3 (L) 01/15/2023 0059   GFRNONAA 43 (L) 01/15/2023 0059   GFRAA >60 12/15/2019 0812   CBC    Component Value Date/Time   WBC 15.0 (H) 01/15/2023 0059   RBC 2.44 (L) 01/15/2023 0059   HGB 7.0 (L) 01/15/2023 0059   HCT 21.3 (L) 01/15/2023 0059   PLT 614 (H) 01/15/2023 0059   MCV 87.3 01/15/2023 0059   MCH 28.7 01/15/2023 0059   MCHC 32.9 01/15/2023 0059   RDW 14.8 01/15/2023 0059   LYMPHSABS 0.5 (L) 01/12/2023 0047  MONOABS 0.5 01/12/2023 0047   EOSABS 0.0 01/12/2023 0047   BASOSABS 0.0 01/12/2023 0047    Assessment/Plan:  AKI - presumably ischemic ATN in setting of sepsis due to pneumonia and poor po intake. Vanco trough was high at 28.  UA with blood and protein but many bacteria and WBC too, low susp for pulm renal but serologies pending. BUN/Cr continued to climb.  CRRT  initiated after right internal jugular catheter placed by PCCM.  Currently off of pressors CRRT prescription:  4k/2.5Ca bath pre and post-filter at 400 mL/hr, dialysate 4K/2.5Ca at 1500 mL/hr, No heparin, UF goal 50-100 mL/hr given edema.  Hemodynamically improved, volume and lytes acceptable so will d/c CRRT at end of shift or if due for filter change.  Ongoing daily labs to assess need for HD needs.  I suspect he'll need HD for a least a few more treatments but probably more.  Still hopeful for recovery.  Avoid nephrotoxic medications including NSAIDs and iodinated intravenous contrast exposure unless the latter is absolutely indicated.  Preferred narcotic agents for pain control are hydromorphone, fentanyl, and methadone. Morphine should not be used. Avoid Baclofen and avoid oral sodium phosphate and magnesium citrate based laxatives / bowel preps. Continue strict Input and Output monitoring. Will monitor the patient closely with you and intervene or adjust therapy as indicated by changes in clinical status/labs   Sepsis - due to recurrent multifocal pneumonia - possible aspiration.  Currently off of norepinephrine.  Serologies pending (ANCA, ANA, antiGBM, Complements).  Acute hypoxic respiratory failure - required intubation now extubated.  Plan per PCCM Iron deficiency anemia - continue to follow, Hb 7, defer transfusion to primary.  SIADH - stable serum sodium levels. AGMA - due to #1, resolved with CRRT.  Severe protein malnutrition - alb 1.5.  cortrak and supplements per PCCM. Esophageal dysmotility - speech path following.   Estill Bakes MD [facility] Pager [phone]

## 2023-01-15 NOTE — Progress Notes (Addendum)
Brief Nutrition Support Note  Spoke with RN during rounds and stated that pt is now taking some PO after SLP cleared him for puree from floor stock and sips of clears/ice chips. Reports that pt complaining of feeling very full. Will adjust TF regimen to allow time off pump during the day while still meeting the majority of pt's needs. Pt continues on CRRT and has high nutrition needs. Hesitant to reduce nutrition too much until pt has an official diet order and is able to take in more PO consistently.   Noted that CBG high. Will adjust to lower carb formula.   Recommend the following: Glucerna 1.5 @ 21ml/hr x 15 hours (1200 ml per day) Prosource TF20 60ml 1x/d Goal tube feeding regimen provides 1880 kcal, 119 gm protein, 911 ml free water daily  Greig Castilla, RD, LDN Clinical Dietitian RD pager # available in [facility]  After hours/weekend pager # available in [facility]

## 2023-01-15 NOTE — Evaluation (Signed)
Physical Therapy Evaluation Patient Details Name: Travis Beck MRN: [id number] DOB: 07/26/1945 Today's Date: 01/15/2023  History of Present Illness  77 yo male admitted 6/29 with SOB, Rt flank and back pain with multilobar PNA. 7/1 respiratory distress requiring intubation. 7/3 extubated. AKI progressing to require CRRT 7/5. PMhx: COPD, T2DM, HTN, SIADH, neuropathy, chronic back pain, anxiety  Clinical Impression  Pt very pleasant and willing to get OOB. PT on CRRT and able to transition from [facility] 30L/ 38% to NRB 15L for mobility during session and transitioned back to [facility] end of session. Pt with decreased cognition, awareness, safety and functional mobility who will benefit from acute therapy to maximize mobility, safety and independence. Pt encouraged to be OOB daily with nursing staff and will continue to progress gait and mobility.   SPO2 98%  HR 91-98 135/75        Assistance Recommended at Discharge Intermittent Supervision/Assistance  If plan is discharge home, recommend the following:  Can travel by private vehicle  A little help with walking and/or transfers;A little help with bathing/dressing/bathroom;Assistance with cooking/housework;Assist for transportation        Equipment Recommendations None recommended by PT  Recommendations for Other Services  OT consult    Functional Status Assessment Patient has had a recent decline in their functional status and demonstrates the ability to make significant improvements in function in a reasonable and predictable amount of time.     Precautions / Restrictions Precautions Precautions: Fall;Other (comment) Precaution Comments: CRRT, HHFNC      Mobility  Bed Mobility Overal bed mobility: Needs Assistance Bed Mobility: Supine to Sit     Supine to sit: Min assist, HOB elevated     General bed mobility comments: HOB 20 degrees, min assist to pivot to right side of bed with increased time and cues, assist  to  manage CRRT/O2    Transfers Overall transfer level: Needs assistance   Transfers: Sit to/from Stand, Bed to chair/wheelchair/BSC Sit to Stand: Min assist Stand pivot transfers: Min assist         General transfer comment: min assist to rise from bed and chair with pt able to perform total of 3 trials with hand held assist to pivot bed to chair.    Ambulation/Gait Ambulation/Gait assistance: Min guard Gait Distance (Feet): 16 Feet Assistive device: Rolling walker (2 wheels) Gait Pattern/deviations: Step-to pattern   Gait velocity interpretation: <1.8 ft/sec, indicate of risk for recurrent falls   General Gait Details: with use of RW pt able to step 2' forward and back repeatedly  Stairs            Wheelchair Mobility     Tilt Bed    Modified Rankin (Stroke Patients Only)       Balance Overall balance assessment: Needs assistance Sitting-balance support: No upper extremity supported, Feet supported Sitting balance-Leahy Scale: Fair     Standing balance support: Single extremity supported, Bilateral upper extremity supported, No upper extremity supported Standing balance-Leahy Scale: Fair Standing balance comment: pt able to stand with and without UE support, RW for stepping                             Pertinent Vitals/Pain Pain Assessment Pain Assessment: No/denies pain    Home Living Family/patient expects to be discharged to:: Private residence Living Arrangements: Other relatives Available Help at Discharge: Family;Available PRN/intermittently Type of Home: House Home Access: Ramped entrance  Home Layout: One level Home Equipment: Wheelchair - Forensic psychologist (2 wheels);Shower seat Additional Comments: pt lives with brother who is blind    Prior Function               Mobility Comments: walks without AD, normally walks daughter's dog daily ADLs Comments: ADL independently, assist for cleaning and washing, tend to  eat out or quick prep meals     Hand Dominance        Extremity/Trunk Assessment   Upper Extremity Assessment Upper Extremity Assessment: Overall WFL for tasks assessed    Lower Extremity Assessment Lower Extremity Assessment: Generalized weakness    Cervical / Trunk Assessment Cervical / Trunk Assessment: Normal  Communication   Communication: No difficulties  Cognition Arousal/Alertness: Awake/alert Behavior During Therapy: WFL for tasks assessed/performed Overall Cognitive Status: Impaired/Different from baseline Area of Impairment: Orientation, Memory, Safety/judgement, Awareness                 Orientation Level: Disoriented to, Time, Place, Situation   Memory: Decreased short-term memory   Safety/Judgement: Decreased awareness of deficits     General Comments: pt stating "someone could live here, they even have a stove" (pt was referring to light on wall). Pt pleasant and able to follow commands for mobility but decrased awareness of orientation and safety        General Comments      Exercises General Exercises - Lower Extremity Hip Flexion/Marching: AROM, Both, Standing, 15 reps   Assessment/Plan    PT Assessment Patient needs continued PT services  PT Problem List Decreased strength;Decreased mobility;Decreased activity tolerance;Decreased cognition;Decreased balance;Decreased knowledge of use of DME       PT Treatment Interventions Gait training;Therapeutic exercise;Patient/family education;Stair training;Balance training;Functional mobility training;DME instruction;Therapeutic activities;Cognitive remediation    PT Goals (Current goals can be found in the Care Plan section)  Acute Rehab PT Goals Patient Stated Goal: return home PT Goal Formulation: With patient/family Time For Goal Achievement: 01/29/23 Potential to Achieve Goals: Good    Frequency Min 3X/week     Co-evaluation               AM-PAC PT "6 Clicks" Mobility   Outcome Measure Help needed turning from your back to your side while in a flat bed without using bedrails?: A Little Help needed moving from lying on your back to sitting on the side of a flat bed without using bedrails?: A Little Help needed moving to and from a bed to a chair (including a wheelchair)?: A Little Help needed standing up from a chair using your arms (e.g., wheelchair or bedside chair)?: A Little Help needed to walk in hospital room?: Total Help needed climbing 3-5 steps with a railing? : Total 6 Click Score: 14    End of Session Equipment Utilized During Treatment: Oxygen Activity Tolerance: Patient tolerated treatment well Patient left: in chair;with call bell/phone within reach;with family/visitor present;with chair alarm set;with nursing/sitter in room Nurse Communication: Mobility status PT Visit Diagnosis: Other abnormalities of gait and mobility (R26.89);Muscle weakness (generalized) (M62.81)    Time: 1610-9604 PT Time Calculation (min) (ACUTE ONLY): 23 min   Charges:   PT Evaluation $PT Eval High Complexity: 1 High   PT General Charges $$ ACUTE PT VISIT: 1 Visit         Merryl Hacker, PT Acute Rehabilitation Services Office: [phone]   Enedina Finner Travis Beck 01/15/2023, 10:29 AM

## 2023-01-15 NOTE — Progress Notes (Signed)
Pharmacy ICU Bowel Regimen Consult Note   Current Inpatient Medications for Bowel Management:  Senna BID  TF (Osmolite 1.5) 40 mL/hr (goal 50 mL/hr)  Assessment: Travis Beck is a 77 y.o. year old male admitted on 01/06/2023. Constipation identified as non-opioid-induced constipation . Bowel regimen assessment completed by Aggie Cosier, RN on 7/7. LBM on 7/4.   [x]  Bowel sounds present [x]  No abdominal tenderness  [x]  Passing gas   Plan: Start docusate 100mg  BID Start polyethylene glycol 17g BID Continue senna 8.6mg  qHS   MD contacted (if needed): Chand  Thank you for allowing pharmacy to participate in this patient's care.  Eldridge Scot, PharmD Clinical Pharmacist 01/15/2023 11:11 AM

## 2023-01-15 NOTE — Progress Notes (Signed)
NAME:  Travis Beck, MRN:  [id number], DOB:  03/20/1946, LOS: 9 ADMISSION DATE:  01/06/2023, CONSULTATION DATE:  01/08/2023 REFERRING MD:  Marland Mcalpine, CHIEF COMPLAINT:  Aspiration Pneumonia, nausea and vomiting   History of Present Illness:  77 y.o. male with medical history significant of IIDM, HTN, SIDAH, COPD, ( Former smoker quit 1994 with a 66 pack year smoking history diabetic neuropathy, recurrent pneumonia presented 01/06/2023 with worsening of nauseous vomiting cough and shortness of breath.  Patient was recently hospitalized 5/23-5/26 for multifocal MRSA pneumonia  and septic shock requiring vasopressor on admission-stabilized and subsequently discharged home on Augmentin/doxycycline . Pt returned to [facility] 12/07/2022 with fever of 103.1 and ongoing left sided pleuritic pain. He has a small effusion that was not big enough to tap.  He was admitted for incompletely treated PNA-as sputum cultures finalized postdischarge, and Doxycycline did not cover MRSA. He was treated with Zyvox, and discharged home on Zyvox p.o on 12/13/2022. Initially his breathing symptoms improved with Zyvox however patient continued to experience frequent nausea, vomiting and cough. He presented to the ED 6/29 as these symptoms were worsening.  In the ED he was mildly febrile at 99.6, He had  tachycardia, oxygen sats were 93% on 3 L. CXR again showed a multifocal pneumonia, right sided. Na 128/ Creatinine of 4.2, BUN 66 and WBC of 20. His HGB was 8.7.Respiratory Panel was negative. Lactic Acid trend since admission 2.7>>2.8>>1,3 Pulmonary was  asked to consult for recurrent vs slow to resolve pneumonia and hypoxemia . Of note , patient is followed in the [facility]  Pulmonary clinic by Dr. Isaiah Serge and Rubye Oaks NP.  Per family patient has had about a 15 pound weight loss since the end of May 24 when pneumonia was initially diagnosed.  Pertinent  Medical History  Arthritis, Asthma, HTN, DM type  2  Significant Hospital Events: Including procedures, antibiotic start and stop dates in addition to other pertinent events   5/23-5/26 hospitalization for septic shock from multilobar PNA 5/30 -6/5 fever 103 F at home-ongoing left-sided pleuritic chest pain 6/29 Recurrent pneumonitis 7/01 intubated 7/03 extubated 7/04 complete ABx 7/05 start CRRT, off pressors 7/06 start steroids  Interim History / Subjective:  Overnight no acute events.  Patient evaluated bedside this morning.  Patient states he did not sleep well at night.  He states he has been able to bring up his secretions.  He denies much shortness of breath this morning.  He denies any abdominal pain.  He states he would like to get some rest. Patinet has not had a bowel movement for 4 days now per nursing staff.   Of note, patient daughter did show up yesterday, asking for patient's collar, and this has given him some distress.  Objective   BP 134/84   Pulse 79   Temp 98.2 F (36.8 C) (Oral)   Resp 13   Ht 5\' 5"  (1.651 m)   Wt 69.4 kg   SpO2 97%   BMI 25.46 kg/m temperature 97.8-98.4 F, pulse 74-97, respiration rate 12-19, blood pressure 130s systolic over 70s-80s diastolic off pressors, satting at 96-97% on high flow nasal cannula 40/30  Examination:  General - alert, interactive  Eyes -tracking appropriately  ENT - no sinus tenderness, no stridor Cardiac -regular rate and rhythm, no murmurs, rubs, or gallops Chest -upper airway sounds clear, with crackles noted laterally Abdomen -soft, nontender, normal active bowel sounds Extremities -1+ pitting ankle edema appreciated Skin - no rashes Neuro -able to follow  all instructions  Output: 3 L CRRT  Labs: Glucose: 261-300 CBC: White count 15.0 (16.3), hemoglobin 7 (7.2), platelet 614 (560) Magnesium: 2.4 RFP: Sodium 133 (134), potassium 4.5 (4.8), creatinine 1.61, BUN 48  Chest x-ray 01/14/2023: No interval change with some decreased opacities noted in left  lung.  Still with diffuse opacities bilaterally.  Resolved Hospital Problem list     Assessment & Plan:   Acute hypoxic respiratory failure with multilobar pneumonitis. Patient evaluated bedside this morning.  Patient denies any shortness of breath.  Patient able to clear secretions.  High flow nasal cannula down to 40/30.  Oxygen saturations holding steady.  Chest x-ray showed clearing opacities, but still diffuse opacities appreciated. -Decreasing oxygen requirements with steroids  -continue solumedrol 80 mg q12h -Continue CRRT for fluid removal -Patient is completely vancomycin treatment -Follow-up chest x-ray intermittently -Goal SpO2 greater 98% improving  AKI from ischemic ATN in setting of sepsis Hyponatreima Uremia  AKI is improving with CRRT.  Creatinine down to 1.6.  GFR improving well.  Patient tolerating CRRT well.  Patient is euvolemic can trial off of CRRT.  Sodium improving well. -Continue CRRT per nephrology -hyponatremia likely is due to low intravascular volume  -uremia 2/2 to steroid use   Asthma Patient has a history of asthma.  Do not think patient has acute exacerbation. -Continue Pyreddy, Brovana, Pulmicort -Continue Singulair -As needed albuterol available   Esophageal dysmotility Given altered mental status and requirement for high flow nasal cannula, GI is not planning to scope patient at this time.  Patient/improved, patient could potentially have a EGD.  This could be the etiology of underlying recurrent pneumonitis. -Continue with core track -Once patient improved, can reconsult GI -Monitor for worsening respiratory status   Severe protein calorie malnutrition. Patient currently on tube feeds. -Continue tube feeds -Dietitian following   IDA, anemia of critical illness. Hemoglobin down to 7.0.  No evidence of bleeding at this time.  Could be related to CRRT.  Will continue to monitor for bleeding. Continue with IV iron infusions  -Monitor CBC   -transfuse for Hb < 7 -Day 2 IV iron    DM type 2 poorly controlled with steroid induced hyperglycemia. With starting of steroids, patient has been hyperglycemic.  Did start Semglee 5 units twice daily yesterday.  Will continue to monitor sugar levels.  Can increase Semglee if needed. -Continue Semglee 5 units twice daily -Continue sliding scale insulin. Change to resistant  -If glucose remains uncontrolled, can increase Semglee   Goals of care. - palliative care consulted  Acute metabolic encephalopathy 2nd to hypoxia and renal failure. Chronic back pain, Depression, Anxiety. - continue pramipexole - prn xanax, neurontin - change subutex to 2 mg SL BID prn  Hx of HLD. - continue pravachol 20 mg daily   Best Practice (right click and "Reselect all SmartList Selections" daily)   Diet/type: tube feeds DVT prophylaxis: SQ heparin GI prophylaxis: protonix Lines: Rt internal jugular HD catheter Foley:  Yes, still needed Code Status:  full code Last date of multidisciplinary goals of care discussion (updated family at bedside)  Labs       Latest Ref Rng & Units 01/15/2023   12:59 AM 01/14/2023    3:27 PM 01/14/2023    2:10 AM  CMP  Glucose 70 - 99 mg/dL 096  045  409   BUN 8 - 23 mg/dL 40  39  49   Creatinine 0.61 - 1.24 mg/dL 8.11  9.14  7.82   Sodium 135 -  145 mmol/L 133  134  133   Potassium 3.5 - 5.1 mmol/L 4.5  4.8  4.8   Chloride 98 - 111 mmol/L 100  100  101   CO2 22 - 32 mmol/L 24  24  23    Calcium 8.9 - 10.3 mg/dL 7.3  7.6  7.3       Latest Ref Rng & Units 01/15/2023   12:59 AM 01/14/2023    2:10 AM 01/13/2023   12:51 AM  CBC  WBC 4.0 - 10.5 K/uL 15.0  16.3  18.3   Hemoglobin 13.0 - 17.0 g/dL 7.0  7.2  7.7   Hematocrit 39.0 - 52.0 % 21.3  21.8  22.7   Platelets 150 - 400 K/uL 614  560  593     ABG    Component Value Date/Time   PHART 7.213 (L) 01/09/2023 0009   PCO2ART 52.9 (H) 01/09/2023 0009   PO2ART 170 (H) 01/09/2023 0009   HCO3 22.5 01/13/2023 0928    TCO2 23 01/09/2023 0009   ACIDBASEDEF 2.3 (H) 01/13/2023 0928   O2SAT 79.5 01/13/2023 0928    CBG (last 3)  Recent Labs    01/14/23 1911 01/14/23 2308 01/15/23 0301  GLUCAP 264* 245* 263*     Critical Care time: 36 minutes  Modena Slater, DO Internal medicine resident PGY-2 [phone] 01/15/2023, 6:16 AM

## 2023-01-15 NOTE — Progress Notes (Signed)
Patient ID: Kairo Ritter, male   DOB: 1946/03/14, 77 y.o.   MRN: [id number]    Progress Note from the Palliative Medicine Team at [facility]   Patient Name: Infantof Bozzi        Date: 01/15/2023 DOB: 1945/12/26  Age: 77 y.o. MRN#: [id number] Attending Physician: Cheri Fowler, MD Primary Care Physician: Kirstie Peri, MD Admit Date: 01/06/2023    Extensive chart review has been completed prior to meeting with patient/family  including labs, vital signs, imaging, progress/consult notes, orders, medications and available advance directive documents.   77 y.o. male   admitted on 01/06/2023 with past   medical history significant of IIDM, HTN, SIDAH, COPD,  former smoker quit 1994 with a 66 pack year smoking history, diabetic neuropathy, recurrent pneumonia presented 01/06/2023 with worsening nausea /, cough and shortness of breath.    Patient was recently hospitalized 5/23-5/26 for multifocal MRSA pneumonia  and septic shock requiring vasopressor on admission-stabilized and subsequently discharged home on Augmentin/doxycycline .    Pt returned to [facility] 12/07/2022 with fever of 103.1 and ongoing left sided pleuritic pain. He has a small effusion that was not big enough to tap.  He was admitted for incompletely treated PNA-as sputum cultures finalized postdischarge, and Doxycycline did not cover MRSA. He was treated with Zyvox, and discharged home on Zyvox p.o on 12/13/2022.    Initially his breathing symptoms improved with Zyvox however patient continued to experience frequent nausea, vomiting and cough. He presented to the ED 6/29 as these symptoms were worsening.   01/06/23 swallow evaluation significant for esophageal dysphagia  01/08/23 worsening chest x-ray, significant for extensive right lung, mild left mid lung and left lower lobe interstitial airspace opacities.  Required intubation  01/10/2023-excessively extubated  01/11/2023-increasing oxygen needs now on 100% O2,  increased agitation, worsening renal function/creatinine 5.28  01/12/2023 CRRT started, pressors  Patient is high risk for decompensation.  This NP assessed patient at the bedside as a follow up  for palliative medicine needs and emotional support.  Patient's sisters Hilda Lias and  Eber Jones at bedside.  Patient is out of bed to the chair, he tells me he feels good although he reports that he did not sleep well last night.   Continued education regarding the seriousness of patient's current medical situation and is high risk for decompensation secondary to his multiple co-morbidities; dysphagia, COPD, recurrent aspiration pneumonia, worsening renal function,  weakness and overall failure to thrive.   Family  at bedside voice great optimism and hope for patient's continued improvement.  I raised awareness to the significance of the initiation of CRRT  Questions and concerns addressed.     Plan of Care: -Full code     -Educated family to consider DNR/DNI status understanding evidenced based poor outcomes in similar hospitalized patient, as the cause of arrest is likely associated with advanced chronic illness rather than an easily reversible acute cardio-pulmonary event. -Family is open to all offered and available medical interventions to prolong life. -Ongoing conversation regarding goals of care Education offered today regarding  the importance of continued conversation with family members  and the  medical providers regarding overall plan of care and treatment options,  ensuring decisions are within the context of the patients values and GOCs.   Patient and family will need ongoing support as they navigate healthcare decisions.  Questions and concerns addressed   Discussed with  bedside RN  PMT will continue to support holistically  Time: 25  minutes  Detailed review of medical records ( labs, imaging, vital signs), medically appropriate exam ( MS, skin, resp)   discussed with treatment  team, counseling and education to patient, family, staff, documenting clinical information, medication management, coordination of care    Lorinda Creed NP  Palliative Medicine Team Team Phone # [phone] Pager [phone]

## 2023-01-16 ENCOUNTER — Inpatient Hospital Stay (HOSPITAL_COMMUNITY): Payer: Medicare Other

## 2023-01-16 DIAGNOSIS — E440 Moderate protein-calorie malnutrition: Secondary | ICD-10-CM | POA: Diagnosis not present

## 2023-01-16 DIAGNOSIS — J189 Pneumonia, unspecified organism: Secondary | ICD-10-CM | POA: Diagnosis not present

## 2023-01-16 DIAGNOSIS — N179 Acute kidney failure, unspecified: Secondary | ICD-10-CM | POA: Diagnosis not present

## 2023-01-16 LAB — RENAL FUNCTION PANEL
Albumin: 1.8 g/dL — ABNORMAL LOW (ref 3.5–5.0)
Albumin: 1.7 g/dL — ABNORMAL LOW (ref 3.5–5.0)
Anion gap: 14 (ref 5–15)
Anion gap: 12 (ref 5–15)
BUN: 32 mg/dL — ABNORMAL HIGH (ref 8–23)
BUN: 42 mg/dL — ABNORMAL HIGH (ref 8–23)
CO2: 22 mmol/L (ref 22–32)
CO2: 24 mmol/L (ref 22–32)
Calcium: 8.2 mg/dL — ABNORMAL LOW (ref 8.9–10.3)
Calcium: 7.7 mg/dL — ABNORMAL LOW (ref 8.9–10.3)
Chloride: 100 mmol/L (ref 98–111)
Chloride: 99 mmol/L (ref 98–111)
Creatinine, Ser: 1.25 mg/dL — ABNORMAL HIGH (ref 0.61–1.24)
Creatinine, Ser: 1.8 mg/dL — ABNORMAL HIGH (ref 0.61–1.24)
GFR, Estimated: 59 mL/min — ABNORMAL LOW (ref >60)
GFR, Estimated: 38 mL/min — ABNORMAL LOW (ref >60)
Glucose, Bld: 174 mg/dL — ABNORMAL HIGH (ref 70–99)
Glucose, Bld: 221 mg/dL — ABNORMAL HIGH (ref 70–99)
Phosphorus: 1.8 mg/dL — ABNORMAL LOW (ref 2.5–4.6)
Phosphorus: 6 mg/dL — ABNORMAL HIGH (ref 2.5–4.6)
Potassium: 4.9 mmol/L (ref 3.5–5.1)
Potassium: 5 mmol/L (ref 3.5–5.1)
Sodium: 136 mmol/L (ref 135–145)
Sodium: 135 mmol/L (ref 135–145)

## 2023-01-16 LAB — GLUCOSE, CAPILLARY
Glucose-Capillary: 180 mg/dL — ABNORMAL HIGH (ref 70–99)
Glucose-Capillary: 251 mg/dL — ABNORMAL HIGH (ref 70–99)
Glucose-Capillary: 158 mg/dL — ABNORMAL HIGH (ref 70–99)
Glucose-Capillary: 218 mg/dL — ABNORMAL HIGH (ref 70–99)
Glucose-Capillary: 262 mg/dL — ABNORMAL HIGH (ref 70–99)
Glucose-Capillary: 191 mg/dL — ABNORMAL HIGH (ref 70–99)

## 2023-01-16 LAB — CBC
HCT: 21.9 % — ABNORMAL LOW (ref 39.0–52.0)
Hemoglobin: 7.4 g/dL — ABNORMAL LOW (ref 13.0–17.0)
MCH: 29.1 pg (ref 26.0–34.0)
MCHC: 33.8 g/dL (ref 30.0–36.0)
MCV: 86.2 fL (ref 80.0–100.0)
Platelets: 570 10*3/uL — ABNORMAL HIGH (ref 150–400)
RBC: 2.54 MIL/uL — ABNORMAL LOW (ref 4.22–5.81)
RDW: 14.7 % (ref 11.5–15.5)
WBC: 24.2 10*3/uL — ABNORMAL HIGH (ref 4.0–10.5)
nRBC: 0.1 % (ref 0.0–0.2)

## 2023-01-16 LAB — BLOOD GAS, VENOUS
Acid-Base Excess: 5.5 mmol/L — ABNORMAL HIGH (ref 0.0–2.0)
Bicarbonate: 28.9 mmol/L — ABNORMAL HIGH (ref 20.0–28.0)
Drawn by: 62344
O2 Saturation: 91.7 %
Patient temperature: 36.9
pCO2, Ven: 37 mmHg — ABNORMAL LOW (ref 44–60)
pH, Ven: 7.5 — ABNORMAL HIGH (ref 7.25–7.43)
pO2, Ven: 53 mmHg — ABNORMAL HIGH (ref 32–45)

## 2023-01-16 LAB — ANCA TITERS
Atypical P-ANCA titer: <1:20 {titer}
C-ANCA: <1:20 {titer}
P-ANCA: <1:20 {titer}

## 2023-01-16 LAB — COMPLEMENT, TOTAL: Compl, Total (CH50): >60 U/mL (ref >41)

## 2023-01-16 LAB — MAGNESIUM: Magnesium: 2.5 mg/dL — ABNORMAL HIGH (ref 1.7–2.4)

## 2023-01-16 LAB — HAPTOGLOBIN: Haptoglobin: 489 mg/dL — ABNORMAL HIGH (ref 34–355)

## 2023-01-16 MED ORDER — PREDNISONE 20 MG PO TABS: 40.0000 mg | ORAL_TABLET | Freq: Every day | ORAL | Status: DC

## 2023-01-16 MED ORDER — FUROSEMIDE 10 MG/ML IJ SOLN
80.0000 mg | Freq: Once | INTRAMUSCULAR | Status: AC
  Administered 2023-01-16: 80 mg via INTRAVENOUS
  Filled 2023-01-16: qty 8

## 2023-01-16 MED ORDER — PREDNISONE 20 MG PO TABS: 30.0000 mg | ORAL_TABLET | Freq: Every day | ORAL | Status: DC

## 2023-01-16 MED ORDER — METHYLPREDNISOLONE SODIUM SUCC 125 MG IJ SOLR: 70.0000 mg | Freq: Every day | INTRAMUSCULAR | Status: DC

## 2023-01-16 MED ORDER — SODIUM PHOSPHATES 45 MMOLE/15ML IV SOLN
45.0000 mmol | Freq: Once | INTRAVENOUS | Status: AC
  Administered 2023-01-16: 45 mmol via INTRAVENOUS
  Filled 2023-01-16: qty 15

## 2023-01-16 MED ORDER — TRAZODONE HCL 50 MG PO TABS
50.0000 mg | ORAL_TABLET | Freq: Every day | ORAL | Status: DC
  Administered 2023-01-16: 50 mg via ORAL
  Filled 2023-01-16: qty 1

## 2023-01-16 MED ORDER — INSULIN ASPART 100 UNIT/ML IJ SOLN
4.0000 [IU] | INTRAMUSCULAR | Status: DC
  Administered 2023-01-16 – 2023-01-17 (×6): 4 [IU] via SUBCUTANEOUS

## 2023-01-16 MED ORDER — METHYLPREDNISOLONE SODIUM SUCC 125 MG IJ SOLR: 80.0000 mg | Freq: Every day | INTRAMUSCULAR | Status: DC

## 2023-01-16 MED ORDER — FUROSEMIDE 10 MG/ML IJ SOLN
INTRAMUSCULAR | Status: AC
  Filled 2023-01-16: qty 4

## 2023-01-16 MED ORDER — METHYLPREDNISOLONE SODIUM SUCC 125 MG IJ SOLR: 50.0000 mg | Freq: Every day | INTRAMUSCULAR | Status: DC

## 2023-01-16 MED ORDER — PREDNISONE 20 MG PO TABS: 20.0000 mg | ORAL_TABLET | Freq: Every day | ORAL | Status: DC

## 2023-01-16 MED ORDER — METHYLPREDNISOLONE SODIUM SUCC 125 MG IJ SOLR: 60.0000 mg | Freq: Every day | INTRAMUSCULAR | Status: DC

## 2023-01-16 MED ORDER — METHYLPREDNISOLONE SODIUM SUCC 125 MG IJ SOLR
70.0000 mg | Freq: Every day | INTRAMUSCULAR | Status: AC
  Administered 2023-01-19 – 2023-01-21 (×3): 70 mg via INTRAVENOUS
  Filled 2023-01-16 (×3): qty 2

## 2023-01-16 MED ORDER — GLUCERNA 1.5 CAL PO LIQD
1000.0000 mL | ORAL | Status: DC
  Administered 2023-01-16 – 2023-01-21 (×6): 1000 mL
  Filled 2023-01-16: qty 1185
  Filled 2023-01-16: qty 1000
  Filled 2023-01-16 (×2): qty 1185
  Filled 2023-01-16: qty 1000
  Filled 2023-01-16 (×5): qty 1185

## 2023-01-16 MED ORDER — PREDNISONE 10 MG PO TABS: 10.0000 mg | ORAL_TABLET | Freq: Every day | ORAL | Status: DC

## 2023-01-16 MED ORDER — DEXMEDETOMIDINE HCL IN NACL 400 MCG/100ML IV SOLN
0.0000 ug/kg/h | INTRAVENOUS | Status: DC
  Administered 2023-01-16: 0.4 ug/kg/h via INTRAVENOUS
  Filled 2023-01-16: qty 100

## 2023-01-16 MED ORDER — METHYLPREDNISOLONE SODIUM SUCC 125 MG IJ SOLR
60.0000 mg | Freq: Every day | INTRAMUSCULAR | Status: AC
  Administered 2023-01-22 – 2023-01-24 (×3): 60 mg via INTRAVENOUS
  Filled 2023-01-16 (×3): qty 2

## 2023-01-16 MED ORDER — QUETIAPINE FUMARATE 25 MG PO TABS: 25.0000 mg | ORAL_TABLET | Freq: Every day | ORAL | Status: DC

## 2023-01-16 MED ORDER — FUROSEMIDE 10 MG/ML IJ SOLN: 40.0000 mg | Freq: Once | INTRAMUSCULAR | Status: DC

## 2023-01-16 MED ORDER — METHYLPREDNISOLONE SODIUM SUCC 125 MG IJ SOLR
80.0000 mg | Freq: Every day | INTRAMUSCULAR | Status: AC
  Administered 2023-01-17 – 2023-01-18 (×2): 80 mg via INTRAVENOUS
  Filled 2023-01-16 (×2): qty 2

## 2023-01-16 NOTE — Progress Notes (Addendum)
eLink Physician-Brief Progress Note Patient Name: Travis Beck DOB: [birth year] MRN: [id number]   Date of Service  01/16/2023  HPI/Events of Note  Bladder scan shows 391 cc, mild abdominal discomfort   eICU Interventions  In-N-Out catheterization as needed   0243 - HGB 6.8 down from 7.4 yesterday.  Hemodynamically stable.  Continue observation. Intervention Category Minor Interventions: Routine modifications to care plan (e.g. PRN medications for pain, fever)  Travis Beck 01/16/2023, 9:55 PM

## 2023-01-16 NOTE — Progress Notes (Addendum)
eLink Physician-Brief Progress Note Patient Name: Bernell Sigal DOB: 09-Sep-1945 MRN: [id number]   Date of Service  01/16/2023  HPI/Events of Note  77 year old male initially presented with recurrent pneumonia with aspiration requiring mechanical ventilation complicated by renal failure currently on continuous renal replacement therapy.  Trying to get out of bed, almost ripped out his trialysis catheter.  Poor safety awareness  eICU Interventions  Posey belt and wrist restraints as needed for patient's safety     Intervention Category Minor Interventions: Agitation / anxiety - evaluation and management  Janny Crute 01/16/2023, 4:21 AM

## 2023-01-16 NOTE — Progress Notes (Signed)
Nutrition Follow-up  DOCUMENTATION CODES:  Non-severe (moderate) malnutrition in context of acute illness/injury  INTERVENTION:  Continue TF via cortrak tube: Glucerna 1.5 at 74ml/hr (1200 ml per day) Goal tube feeding regimen provides 1800 kcal, 99 gm protein, 911 ml free water daily Renavite daily for increased micronutrient needs with HD  NUTRITION DIAGNOSIS:  Moderate Malnutrition related to acute illness (n/v, recurrent aspiration PNA) as evidenced by mild fat depletion, moderate muscle depletion, mild muscle depletion. - remains applicable  GOAL:  Patient will meet greater than or equal to 90% of their needs - not progressing, no nutrition support infusing at this time  MONITOR:  Labs, Weight trends, Diet advancement, TF tolerance  REASON FOR ASSESSMENT:  Ventilator Assessment of nutrition requirement/status  ASSESSMENT:  77 y.o. male admits related to nausea and vomiting, and cough. PMH includes: T2DM, HTN, COPD. Pt is currently receiving medical management related to pneumonia.  6/30 - s/p MBS- findings of esophageal dysphagia; recommend regular diet, thin liquids 7/1 - diet downgraded to NPO; Cortrak placed (tip in distal stomach); decline in respiratory status moved to ICU and intubated  7/3 - extubated 7/5 - CRRT initiated 7/8 - CRRT discontinued,   Pt resting in bed at the time of assessment. Family at bedside. TF off at this time as pt was adjusted to a cyclic feed to allow time to take in POs from floor stock without being overly full. Discussed with RN. Pt only received TF at 1/2 rate over night. Was told pt could not tolerate the higher rate. As pt is still critically ill and will now be on intermittent CRRT, will adjust back to continuous feeds. If/when pt has a diet order and tolerating well will adjust TF regimen. However, GI did note earlier in admission pt may need a PEG to support his nutrition status as he has a long standing history of esophageal  dysmotility.   Discussed plan with RN   Intake/Output Summary (Last 24 hours) at 01/16/2023 1224 Last data filed at 01/16/2023 0800 Gross per 24 hour  Intake 1195.83 ml  Output 2861.2 ml  Net -1665.37 ml  Net IO Since Admission: 11.69 mL [01/16/23 1224]  Nutritionally Relevant Medications: Scheduled Meds:  docusate  100 mg BID   (GLUCERNA 1.5 CAL)  1,000 mL Q24H   PROSource TF20  60 mL Daily   furosemide      insulin aspart  0-20 Units Q4H   insulin aspart  4 Units Q4H   insulin glargine-yfgn  10 Units BID   multivitamin  1 tablet QHS   pantoprazole (PROTONIX) IV  40 mg QHS   polyethylene glycol  17 g BID   pravastatin  20 mg QPM   saccharomyces boulardii  250 mg BID   senna  1 tablet QHS   Continuous Infusions:  sodium phosphate 45 mmol in dextrose 5 % 250 mL infusion 45 mmol (01/16/23 0900)   PRN Meds: furosemide  Labs Reviewed: BUN 32, creatinine 1.25 Phosphorus 1.8 Mg 2.5 CBG ranges from 158-353 mg/dL over the last 24 hours  NUTRITION - FOCUSED PHYSICAL EXAM: Flowsheet Row Most Recent Value  Orbital Region Mild depletion  Upper Arm Region Moderate depletion  Thoracic and Lumbar Region No depletion  Buccal Region Mild depletion  Temple Region No depletion  Clavicle Bone Region Mild depletion  Clavicle and Acromion Bone Region Moderate depletion  Scapular Bone Region Moderate depletion  Dorsal Hand No depletion  Patellar Region Moderate depletion  Anterior Thigh Region Moderate depletion  Posterior Calf  Region No depletion  Edema (RD Assessment) None  Hair Reviewed  Eyes Reviewed  Mouth Reviewed  Skin Reviewed  Nails Reviewed   Diet Order:   Diet Order             Diet NPO time specified  Diet effective now                   EDUCATION NEEDS:  No education needs have been identified at this time  Skin:  Skin Assessment: Reviewed RN Assessment  Last BM:  7/8 - type 6  Height:  Ht Readings from Last 1 Encounters:  01/06/23 5\' 5"  (1.651 m)     Weight:  Wt Readings from Last 1 Encounters:  01/16/23 66 kg    Ideal Body Weight:  61.8 kg  BMI:  Body mass index is 24.21 kg/m.  Estimated Nutritional Needs:  Kcal:  1800-2000 Protein:  95-110 g/d Fluid:  1.8L    Greig Castilla, RD, LDN Clinical Dietitian RD pager # available in [facility]  After hours/weekend pager # available in [facility]

## 2023-01-16 NOTE — Progress Notes (Addendum)
NAME:  Travis Beck, MRN:  [id number], DOB:  Feb 09, 1946, LOS: 10 ADMISSION DATE:  01/06/2023, CONSULTATION DATE:  01/08/2023 REFERRING MD:  Marland Mcalpine, CHIEF COMPLAINT:  Aspiration Pneumonia, nausea and vomiting   History of Present Illness:  77 y.o. male with medical history significant of IIDM, HTN, SIDAH, Asthma, (Former smoker quit 1994 with a 66 pack year smoking history diabetic neuropathy, recurrent pneumonia presented 01/06/2023 with worsening of nauseous vomiting cough and shortness of breath.  Patient was recently hospitalized 5/23-5/26 for multifocal MRSA pneumonia  and septic shock requiring vasopressor on admission-stabilized and subsequently discharged home on Augmentin/doxycycline . Pt returned to [facility] 12/07/2022 with fever of 103.1 and ongoing left sided pleuritic pain. He has a small effusion that was not big enough to tap.  He was admitted for incompletely treated PNA-as sputum cultures finalized postdischarge, and Doxycycline did not cover MRSA. He was treated with Zyvox, and discharged home on Zyvox p.o on 12/13/2022. Initially his breathing symptoms improved with Zyvox however patient continued to experience frequent nausea, vomiting and cough. He presented to the ED 6/29 as these symptoms were worsening.  In the ED he was mildly febrile at 99.6, He had  tachycardia, oxygen sats were 93% on 3 L. CXR again showed a multifocal pneumonia, right sided. Na 128/ Creatinine of 4.2, BUN 66 and WBC of 20. His HGB was 8.7.Respiratory Panel was negative. Lactic Acid trend since admission 2.7>>2.8>>1,3 Pulmonary was  asked to consult for recurrent vs slow to resolve pneumonia and hypoxemia . Of note , patient is followed in the [facility]  Pulmonary clinic by Dr. Isaiah Serge and Rubye Oaks NP.  Per family patient has had about a 15 pound weight loss since the end of May 24 when pneumonia was initially diagnosed.  Pertinent  Medical History  Arthritis, Asthma, HTN, DM type  2  Significant Hospital Events: Including procedures, antibiotic start and stop dates in addition to other pertinent events   5/23-5/26 hospitalization for septic shock from multilobar PNA 5/30 -6/5 fever 103 F at home-ongoing left-sided pleuritic chest pain 6/29 Recurrent pneumonitis 7/01 intubated 7/03 extubated 7/04 complete ABx 7/05 start CRRT, off pressors 7/06 start steroids  Interim History / Subjective:  Overnight patient was a little more agitated and required soft restraint.   Patient evaluated at bedside.  He states he is having shortness of breath.  Patient also tachycardic.  He states he did not sleep well last night.  He denies any abdominal pain.  He denies any concerns or problems.  Patient with no problem yesterday.  Objective   BP 112/66   Pulse 67   Temp 98.6 F (37 C) (Oral)   Resp 14   Ht 5\' 5"  (1.651 m)   Wt 66 kg   SpO2 98%   BMI 24.21 kg/m . Temp 97-65F, Pulse, 80-111, RR- 12-23, BP 124/97, 147/88, Sats O2 on HFNC 4L.   Examination:  General - alert, interactive, confused Eyes -tracking appropriately  Cardiac -tachycardic rate, regular rhythm, no murmurs, rubs, or gallops Chest -bilateral crackles appreciated Abdomen -soft, nontender, normoactive bowel sounds Extremities - 1+ pitting ankle edema appreciated Skin -no rashes Neuro -able to follow all instructions  Output: 3.7 L output  Labs: Glucose: 168-180 CBC: White count 24.2 (15), hemoglobin 7.4 (7), platelet 570 Magnesium: 2.5 RFP: Sodium 136, glucose 174, BUN 32, creatinine 1.25, GFR 59,  No new imaging   Resolved Hospital Problem list     Assessment & Plan:   Acute hypoxic respiratory failure  with multilobar pneumonitis. Improving.  Patient has been weaned down to 4 L nasal cannula.  This morning, patient more short of breath.  Patient does have bilateral crackles.  Patient has been off of CRRT, do think patient does have some volume still.  Will start 1 dose of Lasix this  morning.  Will also order chest x-ray.  Oxygen saturations have been improving, and oxygen requirements have been decreasing.  Will continue with current treatment.   -Decreasing oxygen requirements -Off CRRT now, will give 1 dose of Lasix this morning -Continue Solu-Medrol 80 mg daily, and start to titrate down -Chest x-ray pending -Goal SpO2 greater than 92% -VBG pending   AKI from ischemic ATN in setting of sepsis Hyponatreima Uremia  Creatinine down to 1.25.  GFR 59.  CRRT has been removed by nephrology.  On exam, patient does have 1+ pitting edema.  Hyponatremia resolved.  Uremia trending down. -Follow-up BMP -Monitor urine output -Did give 1 dose of Lasix today   Asthma Patient has a history of asthma.  No acute concerns.  -Continue Yulperi, Brovana, Pulmicort -Continue Singulair -As needed albuterol available   Esophageal dysmotility Patient was advanced to a pured diet.  Patient can get barium swallow once patient is weaned off oxygen to test for esophageal motility -Continue feeds with core track -Once improved, can get barium swallow -Esophageal manometry outpatient    Severe protein calorie malnutrition. Patient currently on tube feeds. -Continue tube feeds -Dietitian following   IDA, anemia of critical illness. Hemoglobin stable this morning.  7.4 from 7 yesterday.  Did stop iron infusion yesterday given anemia is related to critical illness and not IDA. -monitor CBC -No signs of bleeding      DM type 2  Increased to resistant sliding scale yesterday. -Glucose measuring well -Continue sliding scale -Continue Semglee 10 units twice daily   Goals of care. - palliative care consulted  Acute metabolic encephalopathy 2nd to hypoxia and renal failure. Chronic back pain, Depression, Anxiety. Mental status waxes and wanes.  Also related to anxiety.  Will need to continue to reorient patient. -As needed Xanax and Neurontin -Subutex 2 mg sublingual as needed  twice daily -start seroquel   Hx of HLD. - continue pravachol 20 mg daily   Best Practice (right click and "Reselect all SmartList Selections" daily)   Diet/type: tube feeds DVT prophylaxis: SQ heparin GI prophylaxis: protonix Lines: Rt internal jugular HD catheter Foley:  Yes, still needed Code Status:  full code Last date of multidisciplinary goals of care discussion (updated family at bedside)  Labs       Latest Ref Rng & Units 01/16/2023    1:02 AM 01/15/2023    4:31 PM 01/15/2023   12:59 AM  CMP  Glucose 70 - 99 mg/dL 914  782  956   BUN 8 - 23 mg/dL 32  33  40   Creatinine 0.61 - 1.24 mg/dL 2.13  0.86  5.78   Sodium 135 - 145 mmol/L 136  132  133   Potassium 3.5 - 5.1 mmol/L 4.9  4.8  4.5   Chloride 98 - 111 mmol/L 100  98  100   CO2 22 - 32 mmol/L 22  23  24    Calcium 8.9 - 10.3 mg/dL 8.2  7.7  7.3       Latest Ref Rng & Units 01/16/2023    1:02 AM 01/15/2023   12:59 AM 01/14/2023    2:10 AM  CBC  WBC 4.0 - 10.5  K/uL 24.2  15.0  16.3   Hemoglobin 13.0 - 17.0 g/dL 7.4  7.0  7.2   Hematocrit 39.0 - 52.0 % 21.9  21.3  21.8   Platelets 150 - 400 K/uL 570  614  560     ABG    Component Value Date/Time   PHART 7.213 (L) 01/09/2023 0009   PCO2ART 52.9 (H) 01/09/2023 0009   PO2ART 170 (H) 01/09/2023 0009   HCO3 28.9 (H) 01/16/2023 0750   TCO2 23 01/09/2023 0009   ACIDBASEDEF 2.3 (H) 01/13/2023 0928   O2SAT 91.7 01/16/2023 0750    CBG (last 3)  Recent Labs    01/15/23 2306 01/16/23 0314 01/16/23 0709  GLUCAP 168* 180* 251*    Critical Care time: 36 minutes  Modena Slater, DO Internal medicine resident PGY-2 [phone] 01/16/2023, 11:14 AM

## 2023-01-16 NOTE — Evaluation (Signed)
Occupational Therapy Evaluation Patient Details Name: Travis Beck MRN: [id number] DOB: [birth year] Today's Date: 01/16/2023   History of Present Illness 77 yo male admitted 6/29 with SOB, Rt flank and back pain with multilobar PNA. 7/1 respiratory distress requiring intubation. 7/3 extubated. AKI progressing to require CRRT 7/5. PMhx: COPD, T2DM, HTN, SIADH, neuropathy, chronic back pain, anxiety   Clinical Impression   Pt evaluated s/p above admission list. Pt reports independence with ADLs, driving and functional mobility at baseline. Pt receives assistance for cleaning and meal prep. Pt presents this session with generalized weakness, decreased balance, decreased activity tolerance and cognition. Pt disoriented to year and required frequent redirection to follow simple 1 step commands. Pt currently requires setup A for seated UB ADLs and min A for LB ADLs. Pt completed STS transfers from EOB and chair using RW with min guard A and mod cues for hand placement. Pt completed step pivot transfer to recliner and room level mobility using RW with min guard A and mod cues for sequencing. Pt required seated rest break in between bouts of mobility secondary to SOB with vitals listed below. Pt would benefit from continued acute OT services to maximize functional independence and facilitate transition to intensive inpatient follow up therapy, >3 hours/day after discharge.  Session vitals:  BP supine: 135/80, BP EOB: 132/106, BP post activity: 143/85. SpO2 desat to 87% on 4L HFNC, required 6L to maintain >90%, returned to 4L at end of session with SpO2 92%.     Recommendations for follow up therapy are one component of a multi-disciplinary discharge planning process, led by the attending physician.  Recommendations may be updated based on patient status, additional functional criteria and insurance authorization.   Assistance Recommended at Discharge Frequent or constant Supervision/Assistance   Patient can return home with the following A little help with walking and/or transfers;A little help with bathing/dressing/bathroom;Assistance with cooking/housework;Direct supervision/assist for medications management;Direct supervision/assist for financial management;Assist for transportation;Help with stairs or ramp for entrance    Functional Status Assessment  Patient has had a recent decline in their functional status and demonstrates the ability to make significant improvements in function in a reasonable and predictable amount of time.  Equipment Recommendations  Other (comment) (defer)    Recommendations for Other Services Rehab consult     Precautions / Restrictions Precautions Precautions: Fall;Other (comment) Precaution Comments: HFNC, watch O2 Restrictions Weight Bearing Restrictions: No      Mobility Bed Mobility Overal bed mobility: Needs Assistance Bed Mobility: Supine to Sit     Supine to sit: Min guard, HOB elevated     General bed mobility comments: increased time, mod cues for initiation    Transfers Overall transfer level: Needs assistance Equipment used: Rolling walker (2 wheels) Transfers: Sit to/from Stand, Bed to chair/wheelchair/BSC Sit to Stand: Min guard     Step pivot transfers: Min guard     General transfer comment: STS transfer from EOB and chair using RW with min guard A and mod verbal cues for hand placement. Step pivot transfer from EOB>chair using RW with min guard A and mod A for sequencing task. Room level mobility using RW with min guard A and chair follow assist for safety.      Balance Overall balance assessment: Needs assistance Sitting-balance support: Feet supported, No upper extremity supported Sitting balance-Leahy Scale: Fair Sitting balance - Comments: sitting EOB   Standing balance support: Bilateral upper extremity supported, During functional activity, Reliant on assistive device for balance Standing balance-Leahy  Scale:  Poor Standing balance comment: BUE support on RW for stability                           ADL either performed or assessed with clinical judgement   ADL Overall ADL's : Needs assistance/impaired Eating/Feeding: Set up;Sitting   Grooming: Set up;Sitting   Upper Body Bathing: Set up;Sitting   Lower Body Bathing: Minimal assistance;Sit to/from stand   Upper Body Dressing : Set up;Sitting   Lower Body Dressing: Minimal assistance;Sit to/from stand Lower Body Dressing Details (indicate cue type and reason): donned bilat socks sitting EOB via figure four position with min guard-min A for sitting balance Toilet Transfer: Min guard;Regular Toilet;Rolling walker (2 wheels);Ambulation Toilet Transfer Details (indicate cue type and reason): simulated Toileting- Clothing Manipulation and Hygiene: Minimal assistance;Sit to/from stand       Functional mobility during ADLs: Min guard;Rolling walker (2 wheels) General ADL Comments: limited secondary to cognition, generalized weakness, activity tolerance     Vision Baseline Vision/History: 1 Wears glasses Ability to See in Adequate Light: 0 Adequate Vision Assessment?: No apparent visual deficits     Perception Perception Perception Tested?: No   Praxis Praxis Praxis tested?: Not tested    Pertinent Vitals/Pain Pain Assessment Pain Assessment: No/denies pain Pain Intervention(s): Monitored during session     Hand Dominance Right   Extremity/Trunk Assessment Upper Extremity Assessment Upper Extremity Assessment: Generalized weakness   Lower Extremity Assessment Lower Extremity Assessment: Defer to PT evaluation   Cervical / Trunk Assessment Cervical / Trunk Assessment: Normal   Communication Communication Communication: No difficulties   Cognition Arousal/Alertness: Awake/alert Behavior During Therapy: WFL for tasks assessed/performed Overall Cognitive Status: Impaired/Different from baseline Area of  Impairment: Orientation, Memory, Safety/judgement, Awareness, Following commands, Attention                 Orientation Level: Disoriented to, Time Current Attention Level: Sustained Memory: Decreased short-term memory Following Commands: Follows one step commands with increased time Safety/Judgement: Decreased awareness of deficits Awareness: Emergent   General Comments: Pt oriented to self, place and situation. Requires cues for orienting to day of the week and year. Requires frequent redirection and simple 1 step commands throughout. Pt self-distracting and requires cues for safety.     General Comments  BP supine: 135/80, BP EOB: 132/106, BP post activity: 143/85. SpO2 desat to 87% on 4L HFNC, required 6L to maintain >90%, returned to 4L at end of session with SpO2 92%. Family present during session.    Exercises     Shoulder Instructions      Home Living Family/patient expects to be discharged to:: Private residence Living Arrangements: Other relatives (brother) Available Help at Discharge: Family;Available PRN/intermittently Type of Home: House Home Access: Ramped entrance     Home Layout: One level     Bathroom Shower/Tub: Chief Strategy Officer: Standard     Home Equipment: Wheelchair - Forensic psychologist (2 wheels);Shower seat   Additional Comments: pt lives with brother who is blind and requires assistance from aids      Prior Functioning/Environment Prior Level of Function : Independent/Modified Independent;Driving             Mobility Comments: walks without AD, normally walks daughter's dog daily ADLs Comments: ADL independently, driving, assist for cleaning and washing, tend to eat out or quick prep meals        OT Problem List: Decreased strength;Decreased activity tolerance;Impaired balance (sitting and/or standing);Decreased cognition;Decreased safety awareness;Decreased  knowledge of use of DME or AE;Decreased knowledge of  precautions;Cardiopulmonary status limiting activity      OT Treatment/Interventions: Self-care/ADL training;Therapeutic exercise;Energy conservation;DME and/or AE instruction;Therapeutic activities;Cognitive remediation/compensation;Patient/family education;Balance training    OT Goals(Current goals can be found in the care plan section) Acute Rehab OT Goals Patient Stated Goal: to rest OT Goal Formulation: With patient Time For Goal Achievement: 01/30/23 Potential to Achieve Goals: Good ADL Goals Pt Will Perform Grooming: with modified independence;standing Pt Will Perform Lower Body Dressing: with modified independence;sit to/from stand Pt Will Transfer to Toilet: with modified independence;ambulating;regular height toilet Additional ADL Goal #1: Pt will complete 2 step ADL task with min cues  OT Frequency: Min 2X/week    Co-evaluation              AM-PAC OT "6 Clicks" Daily Activity     Outcome Measure Help from another person eating meals?: A Little Help from another person taking care of personal grooming?: A Little Help from another person toileting, which includes using toliet, bedpan, or urinal?: A Little Help from another person bathing (including washing, rinsing, drying)?: A Little Help from another person to put on and taking off regular upper body clothing?: A Little Help from another person to put on and taking off regular lower body clothing?: A Little 6 Click Score: 18   End of Session Equipment Utilized During Treatment: Gait belt;Rolling walker (2 wheels);Oxygen Nurse Communication: Mobility status  Activity Tolerance: Patient tolerated treatment well Patient left: in bed;with call bell/phone within reach;with chair alarm set;with family/visitor present  OT Visit Diagnosis: Unsteadiness on feet (R26.81);Muscle weakness (generalized) (M62.81);Other symptoms and signs involving cognitive function                Time: 1610-9604 OT Time Calculation (min): 22  min Charges:  OT General Charges $OT Visit: 1 Visit OT Evaluation $OT Eval Moderate Complexity: 1 Mod  Sherley Bounds, OTS Acute Rehabilitation Services Office [phone] Secure Chat Communication Preferred   Sherley Bounds 01/16/2023, 5:08 PM

## 2023-01-16 NOTE — Progress Notes (Signed)
Patient ID: Wilhelm Hassan, male   DOB: 1945-09-29, 77 y.o.   MRN: [id number] S: Having some AMS overnight, agitation.  VBG being obtained.  UOP yesterday, none in can today.   O:BP (!) 155/91   Pulse 95   Temp 98.6 F (37 C) (Oral)   Resp (!) 25   Ht 5\' 5"  (1.651 m)   Wt 66 kg   SpO2 100%   BMI 24.21 kg/m   Intake/Output Summary (Last 24 hours) at 01/16/2023 0745 Last data filed at 01/16/2023 0700 Gross per 24 hour  Intake 1820.83 ml  Output 3721.2 ml  Net -1900.37 ml     Gen: fsleepy and mumbling CVS: RRR Resp: occ rhonchi Abd: +BS, soft, NT/ND Ext: trace pretibial edema  Recent Labs  Lab 01/13/23 0051 01/13/23 1552 01/14/23 0210 01/14/23 1527 01/15/23 0059 01/15/23 1631 01/16/23 0102  NA 133* 132* 133* 134* 133* 132* 136  K 3.8 4.6 4.8 4.8 4.5 4.8 4.9  CL 102 101 101 100 100 98 100  CO2 20* 22 23 24 24 23 22   GLUCOSE 128* 219* 248* 333* 291* 348* 174*  BUN 82* 59* 49* 39* 40* 33* 32*  CREATININE 3.82* 2.61* 2.04* 1.77* 1.61* 1.42* 1.25*  ALBUMIN <1.5* <1.5* <1.5* <1.5* <1.5* 1.7* 1.8*  CALCIUM 7.2* 7.1* 7.3* 7.6* 7.3* 7.7* 8.2*  PHOS 4.3 3.4 3.0 2.8 2.7 2.3* 1.8*    Liver Function Tests: Recent Labs  Lab 01/15/23 0059 01/15/23 1631 01/16/23 0102  ALBUMIN <1.5* 1.7* 1.8*    No results for input(s): "LIPASE", "AMYLASE" in the last 168 hours. No results for input(s): "AMMONIA" in the last 168 hours. CBC: Recent Labs  Lab 01/12/23 0047 01/13/23 0051 01/14/23 0210 01/15/23 0059 01/16/23 0102  WBC 19.7* 18.3* 16.3* 15.0* 24.2*  NEUTROABS 18.4*  --   --   --   --   HGB 8.2* 7.7* 7.2* 7.0* 7.4*  HCT 24.5* 22.7* 21.8* 21.3* 21.9*  MCV 83.9 83.2 85.8 87.3 86.2  PLT 713* 593* 560* 614* 570*    Cardiac Enzymes: No results for input(s): "CKTOTAL", "CKMB", "CKMBINDEX", "TROPONINI" in the last 168 hours. CBG: Recent Labs  Lab 01/15/23 1511 01/15/23 1904 01/15/23 2306 01/16/23 0314 01/16/23 0709  GLUCAP 294* 313* 168* 180* 251*      Iron Studies: No results for input(s): "IRON", "TIBC", "TRANSFERRIN", "FERRITIN" in the last 72 hours. Studies/Results: No results found.  arformoterol  15 mcg Nebulization BID   aspirin  81 mg Per Tube Daily   budesonide (PULMICORT) nebulizer solution  0.5 mg Nebulization BID   Chlorhexidine Gluconate Cloth  6 each Topical Daily   docusate  100 mg Per Tube BID   feeding supplement (GLUCERNA 1.5 CAL)  1,000 mL Per Tube Q24H   feeding supplement (PROSource TF20)  60 mL Per Tube Daily   fluticasone  2 spray Each Nare Daily   furosemide  40 mg Intravenous Once   heparin  5,000 Units Subcutaneous Q8H   insulin aspart  0-20 Units Subcutaneous Q4H   insulin glargine-yfgn  10 Units Subcutaneous BID   lidocaine  1 patch Transdermal Q24H   loratadine  10 mg Per Tube Q48H   methylPREDNISolone (SOLU-MEDROL) injection  80 mg Intravenous Daily   montelukast  10 mg Per Tube QHS   multivitamin  1 tablet Per Tube QHS   mouth rinse  15 mL Mouth Rinse 4 times per day   pantoprazole (PROTONIX) IV  40 mg Intravenous QHS   polyethylene glycol  17 g Per Tube BID   pramipexole  0.5 mg Per Tube QHS   pravastatin  20 mg Per Tube QPM   revefenacin  175 mcg Nebulization Daily   saccharomyces boulardii  250 mg Per Tube BID   senna  1 tablet Per Tube QHS    BMET    Component Value Date/Time   NA 136 01/16/2023 0102   K 4.9 01/16/2023 0102   CL 100 01/16/2023 0102   CO2 22 01/16/2023 0102   GLUCOSE 174 (H) 01/16/2023 0102   BUN 32 (H) 01/16/2023 0102   CREATININE 1.25 (H) 01/16/2023 0102   CALCIUM 8.2 (L) 01/16/2023 0102   GFRNONAA 59 (L) 01/16/2023 0102   GFRAA >60 12/15/2019 0812   CBC    Component Value Date/Time   WBC 24.2 (H) 01/16/2023 0102   RBC 2.54 (L) 01/16/2023 0102   HGB 7.4 (L) 01/16/2023 0102   HCT 21.9 (L) 01/16/2023 0102   PLT 570 (H) 01/16/2023 0102   MCV 86.2 01/16/2023 0102   MCH 29.1 01/16/2023 0102   MCHC 33.8 01/16/2023 0102   RDW 14.7 01/16/2023 0102    LYMPHSABS 0.5 (L) 01/12/2023 0047   MONOABS 0.5 01/12/2023 0047   EOSABS 0.0 01/12/2023 0047   BASOSABS 0.0 01/12/2023 0047    Assessment/Plan:  AKI - presumably ischemic ATN in setting of sepsis due to pneumonia and poor po intake. Vanco trough was high at 28.  UA with blood and protein but many bacteria and WBC too, low susp for pulm renal but serologies pending. BUN/Cr continued to climb.  CRRT initiated after right internal jugular catheter placed by PCCM which continued until 7/8 PM.  No indications for RRT at this point but is oliguric and suspect will need a few additional HD sessions at a minimum.  Giving lasix 80 IV this AM and will follow serial labs.   Avoid nephrotoxic medications including NSAIDs and iodinated intravenous contrast exposure unless the latter is absolutely indicated.  Preferred narcotic agents for pain control are hydromorphone, fentanyl, and methadone. Morphine should not be used. Avoid Baclofen and avoid oral sodium phosphate and magnesium citrate based laxatives / bowel preps. Continue strict Input and Output monitoring. Will monitor the patient closely with you and intervene or adjust therapy as indicated by changes in clinical status/labs   AMS - appears to be having delirium this AM, primary investigating (VBG).  BUN in 30s so don't suspect uremia contributing.  Sepsis - due to recurrent multifocal pneumonia - possible aspiration.  Currently off of norepinephrine.  Serologies pending (ANCA pending, ANA pending, antiGBM neg Complements normal).  Acute hypoxic respiratory failure - required intubation now extubated.  VBG being collected this AM, sats look ok.  Iron deficiency anemia - continue to follow, Hb 7, defer transfusion to primary.  SIADH - stable serum sodium levels. Severe protein malnutrition - alb 1.5.  cortrak and supplements per PCCM. Esophageal dysmotility - speech path following.   Estill Bakes MD [facility] Pager [phone]

## 2023-01-17 ENCOUNTER — Inpatient Hospital Stay (HOSPITAL_COMMUNITY): Payer: Medicare Other

## 2023-01-17 DIAGNOSIS — A4102 Sepsis due to Methicillin resistant Staphylococcus aureus: Secondary | ICD-10-CM

## 2023-01-17 DIAGNOSIS — R6520 Severe sepsis without septic shock: Secondary | ICD-10-CM | POA: Diagnosis not present

## 2023-01-17 DIAGNOSIS — J189 Pneumonia, unspecified organism: Secondary | ICD-10-CM | POA: Diagnosis not present

## 2023-01-17 DIAGNOSIS — N179 Acute kidney failure, unspecified: Secondary | ICD-10-CM | POA: Diagnosis not present

## 2023-01-17 LAB — CBC
HCT: 20.9 % — ABNORMAL LOW (ref 39.0–52.0)
Hemoglobin: 6.8 g/dL — CL (ref 13.0–17.0)
MCH: 28.9 pg (ref 26.0–34.0)
MCHC: 32.5 g/dL (ref 30.0–36.0)
MCV: 88.9 fL (ref 80.0–100.0)
Platelets: DECREASED 10*3/uL (ref 150–400)
RBC: 2.35 MIL/uL — ABNORMAL LOW (ref 4.22–5.81)
RDW: 15.3 % (ref 11.5–15.5)
WBC: 16.1 10*3/uL — ABNORMAL HIGH (ref 4.0–10.5)
nRBC: 0 % (ref 0.0–0.2)

## 2023-01-17 LAB — RENAL FUNCTION PANEL
Albumin: 1.7 g/dL — ABNORMAL LOW (ref 3.5–5.0)
Albumin: 1.8 g/dL — ABNORMAL LOW (ref 3.5–5.0)
Anion gap: 10 (ref 5–15)
Anion gap: 11 (ref 5–15)
BUN: 54 mg/dL — ABNORMAL HIGH (ref 8–23)
BUN: 68 mg/dL — ABNORMAL HIGH (ref 8–23)
CO2: 22 mmol/L (ref 22–32)
CO2: 24 mmol/L (ref 22–32)
Calcium: 7.5 mg/dL — ABNORMAL LOW (ref 8.9–10.3)
Calcium: 7.7 mg/dL — ABNORMAL LOW (ref 8.9–10.3)
Chloride: 101 mmol/L (ref 98–111)
Chloride: 98 mmol/L (ref 98–111)
Creatinine, Ser: 2.04 mg/dL — ABNORMAL HIGH (ref 0.61–1.24)
Creatinine, Ser: 2.43 mg/dL — ABNORMAL HIGH (ref 0.61–1.24)
GFR, Estimated: 33 mL/min — ABNORMAL LOW (ref >60)
GFR, Estimated: 26 mL/min — ABNORMAL LOW (ref >60)
Glucose, Bld: 145 mg/dL — ABNORMAL HIGH (ref 70–99)
Glucose, Bld: 196 mg/dL — ABNORMAL HIGH (ref 70–99)
Phosphorus: 5.1 mg/dL — ABNORMAL HIGH (ref 2.5–4.6)
Phosphorus: 5.1 mg/dL — ABNORMAL HIGH (ref 2.5–4.6)
Potassium: 4.4 mmol/L (ref 3.5–5.1)
Potassium: 5.4 mmol/L — ABNORMAL HIGH (ref 3.5–5.1)
Sodium: 133 mmol/L — ABNORMAL LOW (ref 135–145)
Sodium: 133 mmol/L — ABNORMAL LOW (ref 135–145)

## 2023-01-17 LAB — GLUCOSE, CAPILLARY
Glucose-Capillary: 95 mg/dL (ref 70–99)
Glucose-Capillary: 136 mg/dL — ABNORMAL HIGH (ref 70–99)
Glucose-Capillary: 89 mg/dL (ref 70–99)
Glucose-Capillary: 216 mg/dL — ABNORMAL HIGH (ref 70–99)
Glucose-Capillary: 210 mg/dL — ABNORMAL HIGH (ref 70–99)
Glucose-Capillary: 225 mg/dL — ABNORMAL HIGH (ref 70–99)

## 2023-01-17 LAB — MAGNESIUM: Magnesium: 2.3 mg/dL (ref 1.7–2.4)

## 2023-01-17 LAB — TYPE AND SCREEN
ABO/RH(D): B POS
Unit division: 0

## 2023-01-17 LAB — PREPARE RBC (CROSSMATCH)

## 2023-01-17 LAB — HEMOGLOBIN AND HEMATOCRIT, BLOOD
HCT: 27.1 % — ABNORMAL LOW (ref 39.0–52.0)
Hemoglobin: 8.9 g/dL — ABNORMAL LOW (ref 13.0–17.0)

## 2023-01-17 LAB — BPAM RBC: Unit Type and Rh: 7300

## 2023-01-17 MED ORDER — TRAZODONE HCL 50 MG PO TABS
50.0000 mg | ORAL_TABLET | Freq: Every day | ORAL | Status: DC
  Administered 2023-01-18 – 2023-01-24 (×7): 50 mg
  Filled 2023-01-17 (×8): qty 1

## 2023-01-17 MED ORDER — SENNA 8.6 MG PO TABS
2.0000 | ORAL_TABLET | Freq: Two times a day (BID) | ORAL | Status: DC
  Administered 2023-01-17: 17.2 mg
  Filled 2023-01-17 (×2): qty 2

## 2023-01-17 MED ORDER — FUROSEMIDE 10 MG/ML IJ SOLN
80.0000 mg | Freq: Once | INTRAMUSCULAR | Status: AC
  Administered 2023-01-17: 80 mg via INTRAVENOUS
  Filled 2023-01-17: qty 8

## 2023-01-17 MED ORDER — SODIUM CHLORIDE 0.9% IV SOLUTION: Freq: Once | INTRAVENOUS | Status: AC

## 2023-01-17 NOTE — Progress Notes (Signed)
PT Cancellation Note  Patient Details Name: [name] MRN: [id number] DOB: 28-Jun-1946   Cancelled Treatment:    Reason Eval/Treat Not Completed: (P) Fatigue/lethargy limiting ability to participate, per RN pt with little to no sleep overnight, pt now resting and asleep. Will check back in AM to continue with PT POC.  Lenora Boys. PTA Acute Rehabilitation Services Office: [phone]    Catalina Antigua 01/17/2023, 3:48 PM

## 2023-01-17 NOTE — TOC Progression Note (Signed)
Transition of Care [facility]) - Progression Note    Patient Details  Name: Travis Beck MRN: [id number] Date of Birth: Dec 03, 1945  Transition of Care [facility]) CM/SW Contact  Harriet Masson, RN Phone Number: 01/17/2023, 4:57 PM  Clinical Narrative:     Spoke to patient's friend, Terrance, who is agreeable to LTAC at [facility].  Plan is to transfer to [facility] tomorrow. MD is aware.  Expected Discharge Plan: Long Term Acute Care (LTAC) Barriers to Discharge: Continued Medical Work up  Expected Discharge Plan and Services   Discharge Planning Services: CM Consult Post Acute Care Choice: Long Term Acute Care (LTAC) Living arrangements for the past 2 months: Single Family Home                                       Social Determinants of Health (SDOH) Interventions SDOH Screenings   Food Insecurity: No Food Insecurity (12/08/2022)  Housing: Patient Declined (12/08/2022)  Transportation Needs: No Transportation Needs (12/08/2022)  Utilities: Not At Risk (12/08/2022)  Financial Resource Strain: Low Risk  (09/27/2022)  Physical Activity: Inactive (07/31/2022)  Tobacco Use: Medium Risk (01/06/2023)    Readmission Risk Interventions    01/10/2023    2:05 PM 01/08/2023    3:01 PM  Readmission Risk Prevention Plan  Transportation Screening Complete Complete  PCP or Specialist Appt within 3-5 Days  Complete  HRI or Home Care Consult  Complete  Social Work Consult for Recovery Care Planning/Counseling  Complete  Palliative Care Screening  Not Applicable  Medication Review Oceanographer) Complete Complete  PCP or Specialist appointment within 3-5 days of discharge Complete   HRI or Home Care Consult Complete   SW Recovery Care/Counseling Consult Complete   Palliative Care Screening Complete   Skilled Nursing Facility Not Applicable

## 2023-01-17 NOTE — Progress Notes (Signed)
Speech Language Pathology Treatment: Dysphagia  Patient Details Name: Travis Beck MRN: [id number] DOB: May 12, 1946 Today's Date: 01/17/2023 Time: 4098-1191 SLP Time Calculation (min) (ACUTE ONLY): 17 min  Assessment / Plan / Recommendation Clinical Impression  Travis Beck was more alert and more cognizant of surroundings than when last seen by SLP. He is now on 5L [place] and off CRRT. HOB was elevated and he fed himself thin liquids and solids with attentive mastication, the appearance of a brisk swallow, and no overt s/s of aspiration with liquids.  His occasional cough did not necessarily coincide with PO intake.  His MBS on 6/30 revealed a primary esophageal dysphagia with some retention of solids and difficulty passing 13 mm barium pill through UES, but no prandial aspiration and no pharyngeal residuals.  Recommend resuming a PO diet - starting with dysphagia 2/thin liquids after discussion with pt.    Esophageal precautions: HOB upright during meals and 30 minutes after; alternate sips of liquid with bites of solids. GI has seen pt and recommends outpatient barium swallow and esophageal manometry.  SLP will follow.     HPI HPI: Patient is a 77 y.o. male with PMH: HTN, DM-2, COPD, recurrent PNA, esophageal dysphagia (has had EGD's with dilation in 2020 and most recently in 2022). He presented to the hospital on 01/06/2023 SOB, Rt flank and back pain with multilobar PNA. He had MBS with SLP on 01/07/23 which reported a primary esophageal based dysphagia and no further intervention needed.  7/1 intubated due to respiratory distress. Extubated 01/10/23. Swallow re-evaluated 7/3 with recs for NPO given tenuous respiratory status. AKI progressing to require CRRT 7/5. GI was consulted as well and per GI note from 01/09/23, with recommendation for outpatient barium swallow and esophageal manometry due to concern for esophageal dysmotility as well as possible EGD which could be done when his respiratory  issues have improved.      SLP Plan  Continue with current plan of care      Recommendations for follow up therapy are one component of a multi-disciplinary discharge planning process, led by the attending physician.  Recommendations may be updated based on patient status, additional functional criteria and insurance authorization.    Recommendations  Diet recommendations: Dysphagia 2 (fine chop);Thin liquid Liquids provided via: Cup;Straw Medication Administration: Crushed with puree Supervision: Staff to assist with self feeding Compensations: Follow solids with liquid;Slow rate;Small sips/bites Postural Changes and/or Swallow Maneuvers: Seated upright 90 degrees                  Oral care BID     Dysphagia, pharyngoesophageal phase (R13.14)     Continue with current plan of care    Travis Saxton L. Samson Frederic, MA CCC/SLP Clinical Specialist - Acute Care SLP Acute Rehabilitation Services Office number [phone]  Travis Beck  01/17/2023, 2:07 PM

## 2023-01-17 NOTE — Progress Notes (Addendum)
eLink Physician-Brief Progress Note Patient Name: Travis Beck DOB: 05-31-1946 MRN: [id number]   Date of Service  01/17/2023  HPI/Events of Note  77 year old male with diabetes type 2, hypertension who is here with acute hypoxic respiratory failure in the setting of bilateral multifocal inflammatory pneumonitis and MSSA pneumonia   Transferred to the floor earlier today.  Called for desaturation during respiratory treatment.  Was placed on high flow nasal cannula with minimal improvement and then was transitioned to BiPAP.  Now saturating 99% on BiPAP.  eICU Interventions  Requested ground team evaluation.  Chest radiograph is ordered.   0321 -new patient eval.  Chest radiograph reviewed with diffuse bilateral airspace disease.  Has regressed back to nasal cannula and appears comfortable at bedside.  No immediate intervention indicated.  Intervention Category Intermediate Interventions: Respiratory distress - evaluation and management  Thuy Atilano 01/17/2023, 10:00 PM

## 2023-01-17 NOTE — Progress Notes (Signed)
   01/17/23 2134  BiPAP/CPAP/SIPAP  $ Non-Invasive Ventilator  Non-Invasive Vent Initial  $ Face Mask Large  Yes  BiPAP/CPAP/SIPAP Pt Type Adult  BiPAP/CPAP/SIPAP V60  Mask Type Full face mask  Mask Size Large  Set Rate 14 breaths/min  Respiratory Rate 32 breaths/min  IPAP 16 cmH20  EPAP 8 cmH2O  Pressure Support 8 cmH20  FiO2 (%) 60 %  Minute Ventilation 24.7  Leak 46  Peak Inspiratory Pressure (PIP) 17  Tidal Volume (Vt) 783  Patient Home Equipment No  Auto Titrate No  CPAP/SIPAP surface wiped down Yes  BiPAP/CPAP /SiPAP Vitals  SpO2 93 %  Bilateral Breath Sounds Rales   RT found patient to be incredibly SOB with severe WOB and diffuse rales auscultated throughout.  SpO2 high 70s to low 80s on 6L [place].  Since being placed on bipap, pt WOB has decreased with the amount of distress decreased as well.  RT will continue to monitor

## 2023-01-17 NOTE — Progress Notes (Signed)
NAME:  Travis Beck, MRN:  [id number], DOB:  09/13/1945, LOS: 11 ADMISSION DATE:  01/06/2023, CONSULTATION DATE:  01/08/2023 REFERRING MD:  Marland Mcalpine, CHIEF COMPLAINT:  Aspiration Pneumonia, nausea and vomiting   History of Present Illness:  77 y.o. male with medical history significant of IIDM, HTN, SIDAH, Asthma, (Former smoker quit 1994 with a 66 pack year smoking history diabetic neuropathy, recurrent pneumonia presented 01/06/2023 with worsening of nauseous vomiting cough and shortness of breath.  Patient was recently hospitalized 5/23-5/26 for multifocal MRSA pneumonia  and septic shock requiring vasopressor on admission-stabilized and subsequently discharged home on Augmentin/doxycycline . Pt returned to [facility] 12/07/2022 with fever of 103.1 and ongoing left sided pleuritic pain. He has a small effusion that was not big enough to tap.  He was admitted for incompletely treated PNA-as sputum cultures finalized postdischarge, and Doxycycline did not cover MRSA. He was treated with Zyvox, and discharged home on Zyvox p.o on 12/13/2022. Initially his breathing symptoms improved with Zyvox however patient continued to experience frequent nausea, vomiting and cough. He presented to the ED 6/29 as these symptoms were worsening.  In the ED he was mildly febrile at 99.6, He had  tachycardia, oxygen sats were 93% on 3 L. CXR again showed a multifocal pneumonia, right sided. Na 128/ Creatinine of 4.2, BUN 66 and WBC of 20. His HGB was 8.7.Respiratory Panel was negative. Lactic Acid trend since admission 2.7>>2.8>>1,3 Pulmonary was  asked to consult for recurrent vs slow to resolve pneumonia and hypoxemia . Of note , patient is followed in the [facility]  Pulmonary clinic by Dr. Isaiah Serge and Rubye Oaks NP.  Per family patient has had about a 15 pound weight loss since the end of May 24 when pneumonia was initially diagnosed.  Pertinent  Medical History  Arthritis, Asthma, HTN, DM type  2  Significant Hospital Events: Including procedures, antibiotic start and stop dates in addition to other pertinent events   5/23-5/26 hospitalization for septic shock from multilobar PNA 5/30 -6/5 fever 103 F at home-ongoing left-sided pleuritic chest pain 6/29 Recurrent pneumonitis 7/01 intubated 7/03 extubated 7/04 complete ABx 7/05 start CRRT, off pressors 7/06 start steroids  Interim History / Subjective:  Overnight no acute events.  Patient evaluated bedside this morning.  Patient is sitting up speaking on the phone.  He denies any shortness of breath.  He states he is doing well.  Objective   BP (!) 144/98   Pulse 83   Temp 97.9 F (36.6 C) (Oral)   Resp 17   Ht 5\' 5"  (1.651 m)   Wt 66 kg   SpO2 94%   BMI 24.21 kg/m .   Examination:  General -alert, interactive, oriented x 3, in high flow nasal cannula in place Eyes -tracking appropriately  Cardiac -tachycardic, regular rhythm, no murmurs, rubs, or gallops Chest -decreased breath breath sounds bilaterally Abdomen -soft, nontender, normoactive bowel sounds Extremities - 1+ pitting ankle edema appreciated Skin: Bruising noted to right flank region, nontender Neuro -able to follow all instructions  Output: 450 mL  Labs: RFP: Sodium 133, glucose 145, creatinine 2.04, phosphorus 5.1, albumin 1.7 Magnesium 2.3 CBC hemoglobin 6.8, white count 16.1 glucose ranging between 95-136  X-ray yesterday showing stable bilateral lung opacities.  Resolved Hospital Problem list     Assessment & Plan:   #Acute hypoxic respiratory failure with multilobar pneumonitis. Stable at this time.  Patient did increase his nasal cannula to 6 L to maintain saturation greater than 92%.  This  could be more related to fluid as patient did not have much urine output yesterday.  On exam, patient does have decreased inspiratory lung sounds.  Will plan to diurese patient more.  Will continue with steroids.  Will watch patient for 1 more  day in ICU and then potentially transfer out.  Overall do not think patient has underlying infection at this time.  I think this is more related to inflammation and volume overload. -Try to wean off oxygen -1 more dose of 80 mg of Lasix -Monitor urine output -Continue Solu-Medrol 80 mg daily, start weaning down -Goal SpO2 greater than 92%  #AKI from ischemic ATN in setting of sepsis #Hyponatremia #Uremia  Creatinine did bump up to 2.04.  This is expected with Lasix.  Will try to give another dose of Lasix to help patient produce more urine.  Sodium is slightly decreased at 133.  Will continue to follow. -Nephrology following, appreciate recommendation -1 more dose of 80 mg Lasix today -Monitor urine output  #Acute metabolic encephalopathy 2nd to hypoxia and renal failure. #Chronic back pain, Depression, Anxiety. Mental status waxes and wanes.  Did have trazodone last night, which did help.  Patient is more with it today.  No concern for delirium at this time. -From delirium precautions -As needed Xanax and Neurontin -Subutex 2 mg sublingual as needed twice daily -Continue trazodone nightly  #Asthma Oxygen requirement increasing.  Will continue with steroids and bronchodilators.   -Continue Yulperi, Brovana, Pulmicort -Continue Singulair -As needed albuterol available   #Esophageal dysmotility Patient is still on pured diet.  Speech does not think this is related to oropharyngeal concern, rather esophageal motility concern.  GI will not scope patient until he is down to 1 to 2 L of oxygen.  Patient does have outpatient esophageal manometry pending. -Continue feeds with core track -Can potentially get EGD while inpatient if wean down  #Severe protein calorie malnutrition. Continue tube feeds  #IDA, anemia of critical illness. Globin decreased to 6.8 overnight.  Could be iatrogenic.  Patient does have large bruise noted to right flank.  Will give 1 unit and recheck. -Recheck CBC     #DM type 2  Glucose ranging between 89-136 -Continue sliding scale insulin -Continue Semglee 10 units twice daily -Continue mealtime insulin with 4 units every 4 hours    #Goals of care. -Palliative care following  #Hx of HLD. - continue pravachol 20 mg daily   Best Practice (right click and "Reselect all SmartList Selections" daily)   Diet/type: tube feeds DVT prophylaxis: SQ heparin GI prophylaxis: protonix Lines: Rt internal jugular HD catheter Foley:  Yes, still needed Code Status:  full code Last date of multidisciplinary goals of care discussion (updated family at bedside)  Labs       Latest Ref Rng & Units 01/17/2023    1:32 AM 01/16/2023    4:08 PM 01/16/2023    1:02 AM  CMP  Glucose 70 - 99 mg/dL 161  096  045   BUN 8 - 23 mg/dL 54  42  32   Creatinine 0.61 - 1.24 mg/dL 4.09  8.11  9.14   Sodium 135 - 145 mmol/L 133  135  136   Potassium 3.5 - 5.1 mmol/L 4.4  5.0  4.9   Chloride 98 - 111 mmol/L 101  99  100   CO2 22 - 32 mmol/L 22  24  22    Calcium 8.9 - 10.3 mg/dL 7.5  7.7  8.2  Latest Ref Rng & Units 01/17/2023    1:32 AM 01/16/2023    1:02 AM 01/15/2023   12:59 AM  CBC  WBC 4.0 - 10.5 K/uL 16.1  24.2  15.0   Hemoglobin 13.0 - 17.0 g/dL 6.8  C 7.4  7.0   Hematocrit 39.0 - 52.0 % 20.9  21.9  21.3   Platelets 150 - 400 K/uL PLATELET CLUMPS NOTED ON SMEAR, COUNT APPEARS DECREASED  570  614     C Corrected result    ABG    Component Value Date/Time   PHART 7.213 (L) 01/09/2023 0009   PCO2ART 52.9 (H) 01/09/2023 0009   PO2ART 170 (H) 01/09/2023 0009   HCO3 28.9 (H) 01/16/2023 0750   TCO2 23 01/09/2023 0009   ACIDBASEDEF 2.3 (H) 01/13/2023 0928   O2SAT 91.7 01/16/2023 0750    CBG (last 3)  Recent Labs    01/17/23 0313 01/17/23 0744 01/17/23 1124  GLUCAP 95 136* 89    Critical Care time: 36 minutes  Modena Slater, DO Internal medicine resident PGY-2 [phone] 01/17/2023, 11:43 AM

## 2023-01-17 NOTE — Progress Notes (Signed)
Patient received from ongoing nurse, alert and oriented, vital signs stable. Patient able to move position in bed and able to make needs known.Patient received 2000 medications and was pleasant.  Patient went into respiratory distress immediately at the end of his breathing treatment,  desaturating to 60%and 70%. O2 was increased to high flow,  then to nonrebreater, finally placed on BIPAP. After which O2 improved on bIPAP. Critical care MD notified, sent Team to come and see patient. Order to transfer patient to a higher level of care at [facility]

## 2023-01-17 NOTE — Progress Notes (Signed)
Patient ID: Travis Beck, male   DOB: [birth year], 77 y.o.   MRN: [id number] S: UOP up to yesterday with lasix 80 IV. C/o poor sleep but no other issues.     O:BP (!) 146/81   Pulse 83   Temp 97.7 F (36.5 C) (Oral)   Resp 17   Ht 5\' 5"  (1.651 m)   Wt 66 kg   SpO2 95%   BMI 24.21 kg/m   Intake/Output Summary (Last 24 hours) at 01/17/2023 0729 Last data filed at 01/17/2023 0600 Gross per 24 hour  Intake 2673.8 ml  Output 450 ml  Net 2223.8 ml     Gen: awake in bed, NG in place with TF CVS: RRR Resp: occ rhonchi Abd: +BS, soft, NT/ND Ext: trace pretibial edema  Recent Labs  Lab 01/14/23 0210 01/14/23 1527 01/15/23 0059 01/15/23 1631 01/16/23 0102 01/16/23 1608 01/17/23 0132  NA 133* 134* 133* 132* 136 135 133*  K 4.8 4.8 4.5 4.8 4.9 5.0 4.4  CL 101 100 100 98 100 99 101  CO2 23 24 24 23 22 24 22   GLUCOSE 248* 333* 291* 348* 174* 221* 145*  BUN 49* 39* 40* 33* 32* 42* 54*  CREATININE 2.04* 1.77* 1.61* 1.42* 1.25* 1.80* 2.04*  ALBUMIN <1.5* <1.5* <1.5* 1.7* 1.8* 1.7* 1.7*  CALCIUM 7.3* 7.6* 7.3* 7.7* 8.2* 7.7* 7.5*  PHOS 3.0 2.8 2.7 2.3* 1.8* 6.0* 5.1*    Liver Function Tests: Recent Labs  Lab 01/16/23 0102 01/16/23 1608 01/17/23 0132  ALBUMIN 1.8* 1.7* 1.7*    No results for input(s): "LIPASE", "AMYLASE" in the last 168 hours. No results for input(s): "AMMONIA" in the last 168 hours. CBC: Recent Labs  Lab 01/12/23 0047 01/13/23 0051 01/14/23 0210 01/15/23 0059 01/16/23 0102 01/17/23 0132  WBC 19.7* 18.3* 16.3* 15.0* 24.2* 16.1*  NEUTROABS 18.4*  --   --   --   --   --   HGB 8.2* 7.7* 7.2* 7.0* 7.4* 6.8*  HCT 24.5* 22.7* 21.8* 21.3* 21.9* 20.9*  MCV 83.9 83.2 85.8 87.3 86.2 88.9  PLT 713* 593* 560* 614* 570* PLATELET CLUMPS NOTED ON SMEAR, COUNT APPEARS DECREASED    Cardiac Enzymes: No results for input(s): "CKTOTAL", "CKMB", "CKMBINDEX", "TROPONINI" in the last 168 hours. CBG: Recent Labs  Lab 01/16/23 1135 01/16/23 1608  01/16/23 1908 01/16/23 2316 01/17/23 0313  GLUCAP 158* 218* 262* 191* 95     Iron Studies: No results for input(s): "IRON", "TIBC", "TRANSFERRIN", "FERRITIN" in the last 72 hours. Studies/Results: DG CHEST PORT 1 VIEW  Result Date: 01/16/2023 CLINICAL DATA:  Pneumonia. EXAM: PORTABLE CHEST 1 VIEW COMPARISON:  January 14, 2023. FINDINGS: Stable cardiomediastinal silhouette. Feeding tube is seen entering stomach. Right internal jugular catheter is unchanged. Stable bilateral lung opacities as noted on prior exam. Bony thorax unremarkable. IMPRESSION: Stable bilateral lung opacities as described above. Stable support apparatus. Electronically Signed   By: Lupita Raider M.D.   On: 01/16/2023 11:31    arformoterol  15 mcg Nebulization BID   aspirin  81 mg Per Tube Daily   budesonide (PULMICORT) nebulizer solution  0.5 mg Nebulization BID   Chlorhexidine Gluconate Cloth  6 each Topical Daily   docusate  100 mg Per Tube BID   fluticasone  2 spray Each Nare Daily   heparin  5,000 Units Subcutaneous Q8H   insulin aspart  0-20 Units Subcutaneous Q4H   insulin aspart  4 Units Subcutaneous Q4H   insulin glargine-yfgn  10 Units Subcutaneous  BID   lidocaine  1 patch Transdermal Q24H   loratadine  10 mg Per Tube Q48H   methylPREDNISolone (SOLU-MEDROL) injection  80 mg Intravenous Daily   Followed by   Melene Muller ON 01/19/2023] methylPREDNISolone (SOLU-MEDROL) injection  70 mg Intravenous Daily   Followed by   Melene Muller ON 01/22/2023] methylPREDNISolone (SOLU-MEDROL) injection  60 mg Intravenous Daily   Followed by   Melene Muller ON 01/25/2023] methylPREDNISolone (SOLU-MEDROL) injection  50 mg Intravenous Daily   Followed by   Melene Muller ON 01/28/2023] predniSONE  40 mg Per Tube Q breakfast   Followed by   Melene Muller ON 01/31/2023] predniSONE  30 mg Per Tube Q breakfast   Followed by   Melene Muller ON 02/03/2023] predniSONE  20 mg Per Tube Q breakfast   Followed by   Melene Muller ON 02/06/2023] predniSONE  10 mg Per Tube Q breakfast    montelukast  10 mg Per Tube QHS   multivitamin  1 tablet Per Tube QHS   mouth rinse  15 mL Mouth Rinse 4 times per day   pantoprazole (PROTONIX) IV  40 mg Intravenous QHS   polyethylene glycol  17 g Per Tube BID   pramipexole  0.5 mg Per Tube QHS   pravastatin  20 mg Per Tube QPM   revefenacin  175 mcg Nebulization Daily   saccharomyces boulardii  250 mg Per Tube BID   senna  1 tablet Per Tube QHS   traZODone  50 mg Oral QHS    BMET    Component Value Date/Time   NA 133 (L) 01/17/2023 0132   K 4.4 01/17/2023 0132   CL 101 01/17/2023 0132   CO2 22 01/17/2023 0132   GLUCOSE 145 (H) 01/17/2023 0132   BUN 54 (H) 01/17/2023 0132   CREATININE 2.04 (H) 01/17/2023 0132   CALCIUM 7.5 (L) 01/17/2023 0132   GFRNONAA 33 (L) 01/17/2023 0132   GFRAA >60 12/15/2019 0812   CBC    Component Value Date/Time   WBC 16.1 (H) 01/17/2023 0132   RBC 2.35 (L) 01/17/2023 0132   HGB 6.8 (LL) 01/17/2023 0132   HCT 20.9 (L) 01/17/2023 0132   PLT  01/17/2023 0132    PLATELET CLUMPS NOTED ON SMEAR, COUNT APPEARS DECREASED   MCV 88.9 01/17/2023 0132   MCH 28.9 01/17/2023 0132   MCHC 32.5 01/17/2023 0132   RDW 15.3 01/17/2023 0132   LYMPHSABS 0.5 (L) 01/12/2023 0047   MONOABS 0.5 01/12/2023 0047   EOSABS 0.0 01/12/2023 0047   BASOSABS 0.0 01/12/2023 0047    Assessment/Plan:  AKI - presumably ischemic ATN in setting of sepsis due to pneumonia and poor po intake. Vanco trough was high at 28.  UA with blood and protein but many bacteria and WBC too, low susp for pulm renal and serologies neg ANCA and antiGBM. BUN/Cr continued to climb.  CRRT initiated after right internal jugular catheter placed by PCCM which continued until 7/8 PM.  No indications for RRT at this point but maintain HD catheter and daily labs  OK with redosing lasix today as primary planning Avoid nephrotoxic medications including NSAIDs and iodinated intravenous contrast exposure unless the latter is absolutely indicated.  Preferred  narcotic agents for pain control are hydromorphone, fentanyl, and methadone. Morphine should not be used. Avoid Baclofen and avoid oral sodium phosphate and magnesium citrate based laxatives / bowel preps. Continue strict Input and Output monitoring. Will monitor the patient closely with you and intervene or adjust therapy as indicated by changes in clinical status/labs  AMS - delerium off/on.  BUN <<100 so don't suspect uremia contributing.  Sepsis - due to recurrent multifocal pneumonia - possible aspiration.  Serologies pending (ANCA neg, ANA pending, antiGBM neg Complements normal).  Acute hypoxic respiratory failure - required intubation now extubated.  Improving. Iron deficiency anemia - continue to follow, Hb 7, defer transfusion to primary.  SIADH - stable serum sodium levels. Severe protein malnutrition - alb 1.5.  cortrak and supplements per PCCM. Esophageal dysmotility - speech path following.   Estill Bakes MD [facility] Pager [phone]

## 2023-01-17 NOTE — Progress Notes (Signed)
New patient transferred from 36m. Patient is a/o x4. Patient on the monitor  NSR. Patient denies complaints. Patient oriented to room and bed. Patient made aware to not get out of bed without help. Patient pleasant. Coretrac in place. Patient will need tube feeds started back and ordered.

## 2023-01-17 NOTE — Progress Notes (Signed)
Pharmacy ICU Bowel Regimen Consult Note   Current Inpatient Medications for Bowel Management:  Senna 8.6mg  qHS Docusate 100mg  BID Polyethylene glycol 17g BID  TF (Osmolite 1.5) 50 mL/hr (goal)  Assessment: Travis Beck is a 77 y.o. year old male admitted on 01/06/2023. Constipation identified as non-opioid-induced constipation . Bowel regimen assessment completed by Sunny Schlein, RN on 7/10. LBM on 7/8.   [x]  Bowel sounds present [x]  No abdominal tenderness  [x]  Passing gas   Plan: Start docusate 100mg  BID Start polyethylene glycol 17g BID Increase senna to 17.2g BID  MD contacted (if needed): Chand  Thank you for allowing pharmacy to participate in this patient's care.  Eldridge Scot, PharmD Clinical Pharmacist 01/17/2023 10:40 AM

## 2023-01-17 NOTE — Progress Notes (Signed)
Date and time results received: 01/17/23 0209 (use smartphrase ".now" to insert current time)  Test: Hgb Critical Value: 6.8  Name of Provider Notified: Elink  Orders Received? Or Actions Taken?:  See orders

## 2023-01-18 DIAGNOSIS — J9601 Acute respiratory failure with hypoxia: Secondary | ICD-10-CM | POA: Diagnosis not present

## 2023-01-18 DIAGNOSIS — R739 Hyperglycemia, unspecified: Secondary | ICD-10-CM | POA: Diagnosis not present

## 2023-01-18 DIAGNOSIS — N179 Acute kidney failure, unspecified: Secondary | ICD-10-CM | POA: Diagnosis not present

## 2023-01-18 DIAGNOSIS — J984 Other disorders of lung: Secondary | ICD-10-CM

## 2023-01-18 LAB — RENAL FUNCTION PANEL
Albumin: 1.7 g/dL — ABNORMAL LOW (ref 3.5–5.0)
Albumin: 1.7 g/dL — ABNORMAL LOW (ref 3.5–5.0)
Anion gap: 10 (ref 5–15)
Anion gap: 12 (ref 5–15)
BUN: 80 mg/dL — ABNORMAL HIGH (ref 8–23)
BUN: 88 mg/dL — ABNORMAL HIGH (ref 8–23)
CO2: 25 mmol/L (ref 22–32)
CO2: 25 mmol/L (ref 22–32)
Calcium: 7.6 mg/dL — ABNORMAL LOW (ref 8.9–10.3)
Calcium: 7.5 mg/dL — ABNORMAL LOW (ref 8.9–10.3)
Chloride: 99 mmol/L (ref 98–111)
Chloride: 98 mmol/L (ref 98–111)
Creatinine, Ser: 2.84 mg/dL — ABNORMAL HIGH (ref 0.61–1.24)
Creatinine, Ser: 2.99 mg/dL — ABNORMAL HIGH (ref 0.61–1.24)
GFR, Estimated: 22 mL/min — ABNORMAL LOW (ref >60)
GFR, Estimated: 21 mL/min — ABNORMAL LOW (ref >60)
Glucose, Bld: 122 mg/dL — ABNORMAL HIGH (ref 70–99)
Glucose, Bld: 143 mg/dL — ABNORMAL HIGH (ref 70–99)
Phosphorus: 6.3 mg/dL — ABNORMAL HIGH (ref 2.5–4.6)
Phosphorus: 6.8 mg/dL — ABNORMAL HIGH (ref 2.5–4.6)
Potassium: 5.1 mmol/L (ref 3.5–5.1)
Potassium: 5.3 mmol/L — ABNORMAL HIGH (ref 3.5–5.1)
Sodium: 134 mmol/L — ABNORMAL LOW (ref 135–145)
Sodium: 135 mmol/L (ref 135–145)

## 2023-01-18 LAB — BPAM RBC
Blood Product Expiration Date: 202407312359
ISSUE DATE / TIME: 202407101229

## 2023-01-18 LAB — CBC
HCT: 24.9 % — ABNORMAL LOW (ref 39.0–52.0)
Hemoglobin: 8.1 g/dL — ABNORMAL LOW (ref 13.0–17.0)
MCH: 27.9 pg (ref 26.0–34.0)
MCHC: 32.5 g/dL (ref 30.0–36.0)
MCV: 85.9 fL (ref 80.0–100.0)
Platelets: 578 10*3/uL — ABNORMAL HIGH (ref 150–400)
RBC: 2.9 MIL/uL — ABNORMAL LOW (ref 4.22–5.81)
RDW: 15.8 % — ABNORMAL HIGH (ref 11.5–15.5)
WBC: 18.1 10*3/uL — ABNORMAL HIGH (ref 4.0–10.5)
nRBC: 0.1 % (ref 0.0–0.2)

## 2023-01-18 LAB — GLUCOSE, CAPILLARY
Glucose-Capillary: 152 mg/dL — ABNORMAL HIGH (ref 70–99)
Glucose-Capillary: 90 mg/dL (ref 70–99)
Glucose-Capillary: 139 mg/dL — ABNORMAL HIGH (ref 70–99)
Glucose-Capillary: 196 mg/dL — ABNORMAL HIGH (ref 70–99)
Glucose-Capillary: 141 mg/dL — ABNORMAL HIGH (ref 70–99)
Glucose-Capillary: 100 mg/dL — ABNORMAL HIGH (ref 70–99)
Glucose-Capillary: 107 mg/dL — ABNORMAL HIGH (ref 70–99)

## 2023-01-18 LAB — TYPE AND SCREEN: Antibody Screen: NEGATIVE

## 2023-01-18 LAB — MAGNESIUM: Magnesium: 2.1 mg/dL (ref 1.7–2.4)

## 2023-01-18 MED ORDER — ENSURE ENLIVE PO LIQD
237.0000 mL | Freq: Two times a day (BID) | ORAL | Status: DC
  Administered 2023-01-19 – 2023-01-25 (×10): 237 mL via ORAL

## 2023-01-18 MED ORDER — FUROSEMIDE 10 MG/ML IJ SOLN
80.0000 mg | Freq: Once | INTRAMUSCULAR | Status: AC
  Administered 2023-01-18: 80 mg via INTRAVENOUS
  Filled 2023-01-18: qty 8

## 2023-01-18 MED ORDER — CLONAZEPAM 0.5 MG PO TBDP: 1.0000 mg | ORAL_TABLET | Freq: Two times a day (BID) | ORAL | Status: DC

## 2023-01-18 MED ORDER — CLONAZEPAM 0.5 MG PO TBDP
1.0000 mg | ORAL_TABLET | Freq: Two times a day (BID) | ORAL | Status: DC
  Administered 2023-01-18 – 2023-01-22 (×8): 1 mg
  Filled 2023-01-18 (×8): qty 2

## 2023-01-18 MED ORDER — POLYETHYLENE GLYCOL 3350 17 G PO PACK
17.0000 g | PACK | Freq: Every day | ORAL | Status: DC
  Administered 2023-01-19 – 2023-01-21 (×3): 17 g
  Filled 2023-01-18 (×3): qty 1

## 2023-01-18 MED ORDER — CLONAZEPAM 0.25 MG PO TBDP: 0.2500 mg | ORAL_TABLET | Freq: Two times a day (BID) | ORAL | Status: DC

## 2023-01-18 MED ORDER — SENNA 8.6 MG PO TABS
2.0000 | ORAL_TABLET | Freq: Every day | ORAL | Status: DC
  Administered 2023-01-20 – 2023-01-24 (×3): 17.2 mg
  Filled 2023-01-18 (×5): qty 2

## 2023-01-18 NOTE — Progress Notes (Signed)
PT Cancellation Note  Patient Details Name: Scorpio Fortin MRN: [id number] DOB: 12-Feb-1946   Cancelled Treatment:    Reason Eval/Treat Not Completed: (P) Medical issues which prohibited therapy, pt on bipap, hold therapy this PM per RN. Will check back tomorrow to continue to PT POC.   Lenora Boys. PTA Acute Rehabilitation Services Office: [phone]    Catalina Antigua 01/18/2023, 1:53 PM

## 2023-01-18 NOTE — Progress Notes (Addendum)
Patient ID: Travis Beck, male   DOB: 1946/05/29, 77 y.o.   MRN: [id number] S: UOP up to yesterday with lasix 80 IV. Transferred to floor then back to ICU for desat --> ok on [place] this AM.  Poor sleep again. No UOP since transfer --> RN did bladder scan with then he voided .   O:BP (!) 143/90 (BP Location: Left Arm)   Pulse (!) 103   Temp 98.2 F (36.8 C)   Resp (!) 21   Ht 5\' 5"  (1.651 m)   Wt 66 kg   SpO2 (!) 87%   BMI 24.21 kg/m   Intake/Output Summary (Last 24 hours) at 01/18/2023 1058 Last data filed at 01/18/2023 1000 Gross per 24 hour  Intake 1415 ml  Output 1300 ml  Net 115 ml    Gen: awake in bed, NG in place with TF CVS: RRR Resp: occ rhonchi Abd: +BS, soft, NT/ND Ext: trace pretibial edema  Recent Labs  Lab 01/15/23 0059 01/15/23 1631 01/16/23 0102 01/16/23 1608 01/17/23 0132 01/17/23 1710 01/18/23 0738  NA 133* 132* 136 135 133* 133* 134*  K 4.5 4.8 4.9 5.0 4.4 5.4* 5.1  CL 100 98 100 99 101 98 99  CO2 24 23 22 24 22 24 25   GLUCOSE 291* 348* 174* 221* 145* 196* 122*  BUN 40* 33* 32* 42* 54* 68* 80*  CREATININE 1.61* 1.42* 1.25* 1.80* 2.04* 2.43* 2.84*  ALBUMIN <1.5* 1.7* 1.8* 1.7* 1.7* 1.8* 1.7*  CALCIUM 7.3* 7.7* 8.2* 7.7* 7.5* 7.7* 7.6*  PHOS 2.7 2.3* 1.8* 6.0* 5.1* 5.1* 6.3*   Liver Function Tests: Recent Labs  Lab 01/17/23 0132 01/17/23 1710 01/18/23 0738  ALBUMIN 1.7* 1.8* 1.7*   No results for input(s): "LIPASE", "AMYLASE" in the last 168 hours. No results for input(s): "AMMONIA" in the last 168 hours. CBC: Recent Labs  Lab 01/12/23 0047 01/13/23 0051 01/14/23 0210 01/15/23 0059 01/16/23 0102 01/17/23 0132 01/17/23 1710 01/18/23 0738  WBC 19.7*   < > 16.3* 15.0* 24.2* 16.1*  --  18.1*  NEUTROABS 18.4*  --   --   --   --   --   --   --   HGB 8.2*   < > 7.2* 7.0* 7.4* 6.8* 8.9* 8.1*  HCT 24.5*   < > 21.8* 21.3* 21.9* 20.9* 27.1* 24.9*  MCV 83.9   < > 85.8 87.3 86.2 88.9  --  85.9  PLT 713*   < > 560* 614*  570* PLATELET CLUMPS NOTED ON SMEAR, COUNT APPEARS DECREASED  --  578*   < > = values in this interval not displayed.   Cardiac Enzymes: No results for input(s): "CKTOTAL", "CKMB", "CKMBINDEX", "TROPONINI" in the last 168 hours. CBG: Recent Labs  Lab 01/17/23 2039 01/17/23 2321 01/18/23 0117 01/18/23 0331 01/18/23 0808  GLUCAP 210* 225* 152* 90 139*    Iron Studies: No results for input(s): "IRON", "TIBC", "TRANSFERRIN", "FERRITIN" in the last 72 hours. Studies/Results: DG CHEST PORT 1 VIEW  Result Date: 01/17/2023 CLINICAL DATA:  Hypoxia, respiratory distress EXAM: PORTABLE CHEST 1 VIEW COMPARISON:  01/16/2023 FINDINGS: Right dialysis catheter and feeding tube remain in place. Severe diffuse bilateral airspace disease, stable since prior study. Heart mediastinal contours are within normal limits. No visible effusions. No acute bony abnormality. IMPRESSION: Severe diffuse bilateral airspace disease, unchanged. Electronically Signed   By: Charlett Nose M.D.   On: 01/17/2023 22:24    arformoterol  15 mcg Nebulization BID   aspirin  81 mg Per Tube Daily   budesonide (PULMICORT) nebulizer solution  0.5 mg Nebulization BID   Chlorhexidine Gluconate Cloth  6 each Topical Daily   docusate  100 mg Per Tube BID   fluticasone  2 spray Each Nare Daily   heparin  5,000 Units Subcutaneous Q8H   insulin aspart  0-20 Units Subcutaneous Q4H   insulin glargine-yfgn  10 Units Subcutaneous BID   lidocaine  1 patch Transdermal Q24H   loratadine  10 mg Per Tube Q48H   [START ON 01/19/2023] methylPREDNISolone (SOLU-MEDROL) injection  70 mg Intravenous Daily   Followed by   Melene Muller ON 01/22/2023] methylPREDNISolone (SOLU-MEDROL) injection  60 mg Intravenous Daily   Followed by   Melene Muller ON 01/25/2023] methylPREDNISolone (SOLU-MEDROL) injection  50 mg Intravenous Daily   Followed by   Melene Muller ON 01/28/2023] predniSONE  40 mg Per Tube Q breakfast   Followed by   Melene Muller ON 01/31/2023] predniSONE  30 mg Per Tube  Q breakfast   Followed by   Melene Muller ON 02/03/2023] predniSONE  20 mg Per Tube Q breakfast   Followed by   Melene Muller ON 02/06/2023] predniSONE  10 mg Per Tube Q breakfast   montelukast  10 mg Per Tube QHS   multivitamin  1 tablet Per Tube QHS   mouth rinse  15 mL Mouth Rinse 4 times per day   pantoprazole (PROTONIX) IV  40 mg Intravenous QHS   [START ON 01/19/2023] polyethylene glycol  17 g Per Tube Daily   pramipexole  0.5 mg Per Tube QHS   pravastatin  20 mg Per Tube QPM   revefenacin  175 mcg Nebulization Daily   saccharomyces boulardii  250 mg Per Tube BID   [START ON 01/19/2023] senna  2 tablet Per Tube QHS   traZODone  50 mg Per Tube QHS    BMET    Component Value Date/Time   NA 134 (L) 01/18/2023 0738   K 5.1 01/18/2023 0738   CL 99 01/18/2023 0738   CO2 25 01/18/2023 0738   GLUCOSE 122 (H) 01/18/2023 0738   BUN 80 (H) 01/18/2023 0738   CREATININE 2.84 (H) 01/18/2023 0738   CALCIUM 7.6 (L) 01/18/2023 0738   GFRNONAA 22 (L) 01/18/2023 0738   GFRAA >60 12/15/2019 0812   CBC    Component Value Date/Time   WBC 18.1 (H) 01/18/2023 0738   RBC 2.90 (L) 01/18/2023 0738   HGB 8.1 (L) 01/18/2023 0738   HCT 24.9 (L) 01/18/2023 0738   PLT 578 (H) 01/18/2023 0738   MCV 85.9 01/18/2023 0738   MCH 27.9 01/18/2023 0738   MCHC 32.5 01/18/2023 0738   RDW 15.8 (H) 01/18/2023 0738   LYMPHSABS 0.5 (L) 01/12/2023 0047   MONOABS 0.5 01/12/2023 0047   EOSABS 0.0 01/12/2023 0047   BASOSABS 0.0 01/12/2023 0047    Assessment/Plan:  AKI - presumably ischemic ATN in setting of sepsis due to pneumonia and poor po intake. Vanco trough was high at 28.  UA with blood and protein but many bacteria and WBC too, low susp for pulm renal and serologies neg ANCA and antiGBM. BUN/Cr continued to climb.  CRRT initiated after right internal jugular catheter placed by PCCM which continued until 7/8 PM.  No indications for RRT at this point but maintain HD catheter and daily labs, if continued worsening  tomorrow would plan for another HD (BUN/cr 80/2.8 from 68/2.4) Redose lasix today Avoid nephrotoxic medications including NSAIDs and iodinated intravenous contrast exposure unless the latter is  absolutely indicated.  Preferred narcotic agents for pain control are hydromorphone, fentanyl, and methadone. Morphine should not be used. Avoid Baclofen and avoid oral sodium phosphate and magnesium citrate based laxatives / bowel preps. Continue strict Input and Output monitoring. Will monitor the patient closely with you and intervene or adjust therapy as indicated by changes in clinical status/labs   AMS - delerium off/on.  BUN <100 so don't suspect uremia contributing.  Sepsis - due to recurrent multifocal pneumonia - possible aspiration.  Serologies pending (ANCA neg, ANA pending, antiGBM neg Complements normal).  Acute hypoxic respiratory failure - required intubation now extubated.  Improving.  Desat overnight seemed to be transient --> CXR stable infiltrates.  Iron deficiency anemia - continue to follow, Hb 7, defer transfusion to primary.  SIADH - improving serum sodium levels. Severe protein malnutrition - alb 1.5.  cortrak and supplements per PCCM. Esophageal dysmotility - speech path following.   Estill Bakes MD [facility] Pager [phone]

## 2023-01-18 NOTE — Progress Notes (Signed)
NAME:  Travis Beck, MRN:  [id number], DOB:  Apr 28, 1946, LOS: 12 ADMISSION DATE:  01/06/2023, CONSULTATION DATE:  01/08/2023 REFERRING MD:  Marland Mcalpine, CHIEF COMPLAINT:  Aspiration Pneumonia, nausea and vomiting   History of Present Illness:  77 y.o. male with medical history significant of IIDM, HTN, SIDAH, Asthma, (Former smoker quit 1994 with a 66 pack year smoking history diabetic neuropathy, recurrent pneumonia presented 01/06/2023 with worsening of nauseous vomiting cough and shortness of breath.  Patient was recently hospitalized 5/23-5/26 for multifocal MRSA pneumonia  and septic shock requiring vasopressor on admission-stabilized and subsequently discharged home on Augmentin/doxycycline . Pt [facility] 12/07/2022 with fever of 103.1 and ongoing left sided pleuritic pain. He has a small effusion that was not big enough to tap.  He was admitted for incompletely treated PNA-as sputum cultures finalized postdischarge, and Doxycycline did not cover MRSA. He was treated with Zyvox, and discharged home on Zyvox p.o on 12/13/2022. Initially his breathing symptoms improved with Zyvox however patient continued to experience frequent nausea, vomiting and cough. He presented to the ED 6/29 as these symptoms were worsening.  In the ED he was mildly febrile at 99.6, He had  tachycardia, oxygen sats were 93% on 3 L. CXR again showed a multifocal pneumonia, right sided. Na 128/ Creatinine of 4.2, BUN 66 and WBC of 20. His HGB was 8.7.Respiratory Panel was negative. Lactic Acid trend since admission 2.7>>2.8>>1,3 Pulmonary was  asked to consult for recurrent vs slow to resolve pneumonia and hypoxemia . Of note , patient is followed in the [facility]  Pulmonary clinic by Dr. Isaiah Serge and Rubye Oaks NP.  Per family patient has had about a 15 pound weight loss since the end of May 24 when pneumonia was initially diagnosed.  Pertinent  Medical History  Arthritis, Asthma, HTN, DM type  2  Significant Hospital Events: Including procedures, antibiotic start and stop dates in addition to other pertinent events   5/23-5/26 hospitalization for septic shock from multilobar PNA 5/30 -6/5 fever 103 F at home-ongoing left-sided pleuritic chest pain 6/29 Recurrent pneumonitis 7/01 intubated 7/03 extubated 7/04 complete ABx 7/05 start CRRT, off pressors 7/06 start steroids  Interim History / Subjective:  Moved back to ICU overnight for desaturation.  Very agitated today and feeling SOB. Feels like his breathing treatment made him worse.   Objective   BP (!) 143/90 (BP Location: Left Arm)   Pulse (!) 103   Temp 98.2 F (36.8 C)   Resp (!) 21   Ht 5\' 5"  (1.651 m)   Wt 66 kg   SpO2 (!) 87%   BMI 24.21 kg/m .   Examination: General -ill appearing man sitting edge of bed in mild distress HEENT- [place]/AT, eyes anicteric Cardiac -S1S2, tachycardic Chest -wheezing and rhales bilaterally, tachypnea, tripoding. More comfortably breathing on bipap Abdomen -soft, NT Extremities - no significant edema, no cyanosis Skin:  warm, dry Neuro - awake, alert, answering questions appropraitely    CXR personally reviewed: diffuse airspace disease, has prominence peripherally, not significantly different from previous   Na+  134 BUN 80 Cr 2.84 WBC 18.1 H/H 8.1/24.9 Platelets 578 BG 80-200s  Resolved Hospital Problem list     Assessment & Plan:   #Acute hypoxic respiratory failure with multilobar pneumonitis related to aspiration; at risk for post-infectious inflammatory process as well. TRALI after transfusion on 7/10 is possible.  -diuresis -BiPAP PRN -con't steroids> taper ordered -con't bronchodilators  #AKI from ischemic ATN in setting of sepsis #Hyponatremia #  Uremia  -appreciate Nephrology's management; keep trialysis catheter until he nephro is ready to remove it.  -lasix per nephro -strict I/O  #Acute metabolic encephalopathy 2nd to hypoxia and renal  failure. #Chronic back pain, Depression, Anxiety. -clonazepam BID -delirium precautions; family has been updated to expect delirium when he moves around the hospital in the future -neurontin -suboxone -nightly trazodone  #Asthma -con't triple inhaled therapy -albuterol PRN -singulair   #Esophageal dysmotility Patient is still on pured diet.  Speech does not think this is related to oropharyngeal concern, rather esophageal motility concern.  GI will not scope patient until he is down to 1 to 2 L of oxygen.  Patient does have outpatient esophageal manometry pending. -con't TF with cortrak -I would be very cautious before advancing diet due to high risk of future aspirations -Can potentially get EGD while inpatient if wean down O2 enough -PPI  #Severe protein calorie malnutrition. -con't TF  #IDA, anemia of critical illness. -serial CBCs; transfuse for Hb <7 or hemodynamically significant bleeding -last transfusion 7/10   #DM type 2, hyperglycemia, few hypoglycemic episodes -SSI PRN -Glargine 10 units BID -TF coverage d/c -goal BG 140-180 -need to watch insulin doses with steroid taper in the coming days   #Goals of care. -Palliative care following  #Hx of HLD. -pravastatin   Sisters updated at bedside today. Due to severity of respiratory failure, needs to remain in ICU again today.   Best Practice (right click and "Reselect all SmartList Selections" daily)   Diet/type: tube feeds DVT prophylaxis: SQ heparin GI prophylaxis: protonix Lines: Rt internal jugular HD catheter Foley:  Yes, still needed Code Status:  full code Last date of multidisciplinary goals of care discussion (updated family at bedside 7/11)  Labs       Latest Ref Rng & Units 01/18/2023    7:38 AM 01/17/2023    5:10 PM 01/17/2023    1:32 AM  CMP  Glucose 70 - 99 mg/dL 454  098  119   BUN 8 - 23 mg/dL 80  68  54   Creatinine 0.61 - 1.24 mg/dL 1.47  8.29  5.62   Sodium 135 - 145 mmol/L 134  133   133   Potassium 3.5 - 5.1 mmol/L 5.1  5.4  4.4   Chloride 98 - 111 mmol/L 99  98  101   CO2 22 - 32 mmol/L 25  24  22    Calcium 8.9 - 10.3 mg/dL 7.6  7.7  7.5       Latest Ref Rng & Units 01/18/2023    7:38 AM 01/17/2023    5:10 PM 01/17/2023    1:32 AM  CBC  WBC 4.0 - 10.5 K/uL 18.1   16.1   Hemoglobin 13.0 - 17.0 g/dL 8.1  8.9  6.8  C  Hematocrit 39.0 - 52.0 % 24.9  27.1  20.9   Platelets 150 - 400 K/uL 578   PLATELET CLUMPS NOTED ON SMEAR, COUNT APPEARS DECREASED     C Corrected result    ABG    Component Value Date/Time   PHART 7.213 (L) 01/09/2023 0009   PCO2ART 52.9 (H) 01/09/2023 0009   PO2ART 170 (H) 01/09/2023 0009   HCO3 28.9 (H) 01/16/2023 0750   TCO2 23 01/09/2023 0009   ACIDBASEDEF 2.3 (H) 01/13/2023 0928   O2SAT 91.7 01/16/2023 0750    CBG (last 3)  Recent Labs    01/18/23 0117 01/18/23 0331 01/18/23 0808  GLUCAP 152* 90 139*  Critical Care time: 45 min.       Steffanie Dunn, DO 01/18/23 8:00 PM [facility]  For contact information, see Amion. If no response to pager, please call PCCM consult pager. After hours, 7PM- 7AM, please call Elink.

## 2023-01-18 NOTE — Plan of Care (Signed)
  Problem: Fluid Volume: Goal: Ability to maintain a balanced intake and output will improve Outcome: Not Progressing   Problem: Nutritional: Goal: Maintenance of adequate nutrition will improve Outcome: Not Progressing Goal: Progress toward achieving an optimal weight will improve Outcome: Not Progressing   Problem: Skin Integrity: Goal: Risk for impaired skin integrity will decrease Outcome: Progressing

## 2023-01-18 NOTE — Progress Notes (Signed)
Occupational Therapy Treatment Patient Details Name: Travis Beck MRN: [id number] DOB: April 01, 1946 Today's Date: 01/18/2023   History of present illness 77 yo male admitted 6/29 with SOB, Rt flank and back pain with multilobar PNA. 7/1 respiratory distress requiring intubation. 7/3 extubated. AKI progressing to require CRRT 7/5. PMhx: COPD, T2DM, HTN, SIADH, neuropathy, chronic back pain, anxiety   OT comments  Supine on arrival and eager to participate, however, growing anxiety with family in room cueing pt to watch for lines and leads despite re-assurance OT to assume this role. Pt friends/family also with max concerns pt with bloody sputum (pt reports this has been going on throughout hospitalization). Pt becoming more anxious as approaching EOB and coughing bloody sputum onto gown; then anxious about blood as well as feeling more SOB with being observed to breathe through mouth. Max cues and redirection to both physical and visual commands to engage in deep breathing in through nose at EOB (as pt on HFNC). Observed with frequent nose holding and difficulty coordinating pursed lip breathing requiring constant cueing. SpO2 as low as 78 and RR up to 35 EOB, so return to supine with continued cues for breathing and request RN enter for administration of anxiety medication. Pt constantly requesting more air and ultimately placed on bipap. SpO2 96% and only slightly anxious mood state on departure with continued cues from staff for focusing on breathing rather than speaking.    Recommendations for follow up therapy are one component of a multi-disciplinary discharge planning process, led by the attending physician.  Recommendations may be updated based on patient status, additional functional criteria and insurance authorization.    Assistance Recommended at Discharge Frequent or constant Supervision/Assistance  Patient can return home with the following  A little help with walking and/or  transfers;A little help with bathing/dressing/bathroom;Assistance with cooking/housework;Direct supervision/assist for medications management;Direct supervision/assist for financial management;Assist for transportation;Help with stairs or ramp for entrance   Equipment Recommendations  Other (comment) (defer)    Recommendations for Other Services      Precautions / Restrictions Precautions Precautions: Fall;Other (comment) Precaution Comments: watch O2 Restrictions Weight Bearing Restrictions: No       Mobility Bed Mobility Overal bed mobility: Needs Assistance Bed Mobility: Supine to Sit, Sit to Supine     Supine to sit: Min guard, HOB elevated Sit to supine: Min guard        Transfers                         Balance                                           ADL either performed or assessed with clinical judgement   ADL Overall ADL's : Needs assistance/impaired     Grooming: Min guard;Sitting Grooming Details (indicate cue type and reason): Max cues for redirection as pt max internally distracted         Upper Body Dressing : Set up;Sitting Upper Body Dressing Details (indicate cue type and reason): new gown. Pt was cued for pacing self and breathing throughout                        Extremity/Trunk Assessment Upper Extremity Assessment Upper Extremity Assessment: Generalized weakness   Lower Extremity Assessment Lower Extremity Assessment: Generalized weakness  Vision   Vision Assessment?: No apparent visual deficits   Perception     Praxis      Cognition Arousal/Alertness: Awake/alert Behavior During Therapy: Anxious Overall Cognitive Status: Impaired/Different from baseline Area of Impairment: Orientation, Memory, Safety/judgement, Awareness, Following commands, Attention                 Orientation Level: Disoriented to, Time Current Attention Level: Sustained Memory: Decreased short-term  memory Following Commands: Follows one step commands with increased time Safety/Judgement: Decreased awareness of deficits Awareness: Emergent   General Comments: Pt minimally anxious on arrival, over responsive to anxiety of friends in room regarding location of lines and leads as well as placement of O2 and breathing techniques. Pt increasingly anxious throughout session stating he needs "more air" and making egative comments about his livelihood. Poor breathing techniques to optimize SpO2. Able to be redirected from negative comments but requiring max multimodal and multisystem cues to engage in pursed lip/deep breathing in through his nose due to O2 via [place]        Exercises      Shoulder Instructions       General Comments Pt eager to mobilize on arrival, however, with increasing anxiety after return to retriece O2 tank with friends/family in room and cueing pt regarding his lines/leads. Once EOB, pt max anxious and difficulty redirecting in either breathing techniques or to lie back down reporting he cannot do either. Max multimodal cues to deep breathing through nose (wearing [place] on arrival at 8L) with cues for eye contact with therapist; visual cues for following therapist lead. Max internal distracted. SpO2 reading as low as 78 EOB; but pt hyperverbose throuhout and intermittently poor pleth. Return to supine, and retrieved RN for assist with pt anxiety and pt requesting more O2/bcak on bipap. Pt placed on Bipap with respiratory therapist and RN in room on departure.    Pertinent Vitals/ Pain       Pain Assessment Pain Assessment: No/denies pain Pain Intervention(s): Monitored during session  Home Living                                          Prior Functioning/Environment              Frequency  Min 2X/week        Progress Toward Goals  OT Goals(current goals can now be found in the care plan section)  Progress towards OT goals: Progressing toward  goals  Acute Rehab OT Goals Patient Stated Goal: get more oxygen OT Goal Formulation: With patient Time For Goal Achievement: 01/30/23 Potential to Achieve Goals: Good  Plan Frequency remains appropriate;Discharge plan remains appropriate    Co-evaluation                 AM-PAC OT "6 Clicks" Daily Activity     Outcome Measure   Help from another person eating meals?: A Little Help from another person taking care of personal grooming?: A Little Help from another person toileting, which includes using toliet, bedpan, or urinal?: A Little Help from another person bathing (including washing, rinsing, drying)?: A Little Help from another person to put on and taking off regular upper body clothing?: A Little Help from another person to put on and taking off regular lower body clothing?: A Little 6 Click Score: 18    End of Session Equipment Utilized During Treatment: Oxygen  OT Visit Diagnosis: Unsteadiness on feet (R26.81);Muscle weakness (generalized) (M62.81);Other symptoms and signs involving cognitive function   Activity Tolerance Treatment limited secondary to medical complications (Comment);Other (comment) (limited by anxiety)   Patient Left in bed;with call bell/phone within reach;with bed alarm set;with nursing/sitter in room;with family/visitor present   Nurse Communication Mobility status;Other (comment) (anxiety, O2, in room on departure)        Time: 1025-1056 OT Time Calculation (min): 31 min  Charges: OT General Charges $OT Visit: 1 Visit OT Treatments $Self Care/Home Management : 23-37 mins  Tyler Deis, OTR/L [facility] Acute Rehabilitation Office: [phone]   Myrla Halsted 01/18/2023, 2:13 PM

## 2023-01-18 NOTE — Progress Notes (Signed)
Re-examined this afternoon. Sleeping comfortably on BiPAP.   Updated 2 sisters at bedside.  We reviewed his care and long-term issues with dysphagia, including that as he begins trying to eat again, he may aspirate again and have worsening respiratory failure that could prove to be a setback. It is reasonable to expect that it could take up to a year to recover from his illness, and his recovery may not be complete. He may always have scarring in his lungs following this. I suspect anxiety is a significant portion of his varying symptom level, and we are starting clonazepam to help with this.   Steffanie Dunn, DO 01/18/23 3:06 PM [facility]  For contact information, see Amion. If no response to pager, please call PCCM consult pager. After hours, 7PM- 7AM, please call Elink.

## 2023-01-18 NOTE — Progress Notes (Signed)
Patient transfered to 3M10 after getting the call from the Nurse that the room is ready. Report given to Avel Peace, RN

## 2023-01-18 NOTE — Progress Notes (Signed)
Pharmacy ICU Bowel Regimen Consult Note   Current Inpatient Medications for Bowel Management:  Senna 8.6mg  qHS Docusate 100mg  BID Polyethylene glycol 17g BID  TF (Osmolite 1.5) 50 mL/hr (goal)  Assessment: Travis Beck is a 77 y.o. year old male admitted on 01/06/2023. Constipation identified as non-opioid-induced constipation . Bowel regimen assessment completed by Selena Batten, RN on 7/10. Large BM on 7/11 AM.   [x]  Bowel sounds present [x]  No abdominal tenderness  [x]  Passing gas   Plan: Decrease miralax and senna to daily   MD contacted (if needed): Chand  Thank you for allowing pharmacy to participate in this patient's care.  Calton Dach, PharmD Clinical Pharmacist 01/18/2023 10:31 AM

## 2023-01-19 DIAGNOSIS — J690 Pneumonitis due to inhalation of food and vomit: Secondary | ICD-10-CM | POA: Diagnosis not present

## 2023-01-19 DIAGNOSIS — E440 Moderate protein-calorie malnutrition: Secondary | ICD-10-CM | POA: Diagnosis not present

## 2023-01-19 DIAGNOSIS — R1319 Other dysphagia: Secondary | ICD-10-CM | POA: Diagnosis not present

## 2023-01-19 DIAGNOSIS — J9601 Acute respiratory failure with hypoxia: Secondary | ICD-10-CM | POA: Diagnosis not present

## 2023-01-19 LAB — RENAL FUNCTION PANEL
Albumin: 1.8 g/dL — ABNORMAL LOW (ref 3.5–5.0)
Albumin: 1.8 g/dL — ABNORMAL LOW (ref 3.5–5.0)
Anion gap: 18 — ABNORMAL HIGH (ref 5–15)
Anion gap: 13 (ref 5–15)
BUN: 94 mg/dL — ABNORMAL HIGH (ref 8–23)
BUN: 105 mg/dL — ABNORMAL HIGH (ref 8–23)
CO2: 24 mmol/L (ref 22–32)
CO2: 23 mmol/L (ref 22–32)
Calcium: 8 mg/dL — ABNORMAL LOW (ref 8.9–10.3)
Calcium: 7.4 mg/dL — ABNORMAL LOW (ref 8.9–10.3)
Chloride: 95 mmol/L — ABNORMAL LOW (ref 98–111)
Chloride: 95 mmol/L — ABNORMAL LOW (ref 98–111)
Creatinine, Ser: 3.02 mg/dL — ABNORMAL HIGH (ref 0.61–1.24)
Creatinine, Ser: 3.06 mg/dL — ABNORMAL HIGH (ref 0.61–1.24)
GFR, Estimated: 20 mL/min — ABNORMAL LOW (ref >60)
GFR, Estimated: 20 mL/min — ABNORMAL LOW (ref >60)
Glucose, Bld: 89 mg/dL (ref 70–99)
Glucose, Bld: 207 mg/dL — ABNORMAL HIGH (ref 70–99)
Phosphorus: 7.8 mg/dL — ABNORMAL HIGH (ref 2.5–4.6)
Phosphorus: 6.6 mg/dL — ABNORMAL HIGH (ref 2.5–4.6)
Potassium: 5.2 mmol/L — ABNORMAL HIGH (ref 3.5–5.1)
Potassium: 5.2 mmol/L — ABNORMAL HIGH (ref 3.5–5.1)
Sodium: 137 mmol/L (ref 135–145)
Sodium: 131 mmol/L — ABNORMAL LOW (ref 135–145)

## 2023-01-19 LAB — GLUCOSE, CAPILLARY
Glucose-Capillary: 83 mg/dL (ref 70–99)
Glucose-Capillary: 166 mg/dL — ABNORMAL HIGH (ref 70–99)
Glucose-Capillary: 206 mg/dL — ABNORMAL HIGH (ref 70–99)
Glucose-Capillary: 207 mg/dL — ABNORMAL HIGH (ref 70–99)
Glucose-Capillary: 236 mg/dL — ABNORMAL HIGH (ref 70–99)
Glucose-Capillary: 91 mg/dL (ref 70–99)

## 2023-01-19 LAB — CBC
HCT: 23.1 % — ABNORMAL LOW (ref 39.0–52.0)
Hemoglobin: 7.3 g/dL — ABNORMAL LOW (ref 13.0–17.0)
MCH: 28.3 pg (ref 26.0–34.0)
MCHC: 31.6 g/dL (ref 30.0–36.0)
MCV: 89.5 fL (ref 80.0–100.0)
Platelets: 424 10*3/uL — ABNORMAL HIGH (ref 150–400)
RBC: 2.58 MIL/uL — ABNORMAL LOW (ref 4.22–5.81)
RDW: 15.9 % — ABNORMAL HIGH (ref 11.5–15.5)
WBC: 12.9 10*3/uL — ABNORMAL HIGH (ref 4.0–10.5)
nRBC: 0 % (ref 0.0–0.2)

## 2023-01-19 LAB — MAGNESIUM: Magnesium: 2.3 mg/dL (ref 1.7–2.4)

## 2023-01-19 LAB — HEPATITIS B SURFACE ANTIGEN: Hepatitis B Surface Ag: NONREACTIVE

## 2023-01-19 MED ORDER — ANTICOAGULANT SODIUM CITRATE 4% (200MG/5ML) IV SOLN: 5.0000 mL | Status: DC | PRN

## 2023-01-19 MED ORDER — CHLORHEXIDINE GLUCONATE CLOTH 2 % EX PADS: 6.0000 | MEDICATED_PAD | Freq: Every day | CUTANEOUS | Status: DC

## 2023-01-19 MED ORDER — PHENOL 1.4 % MT LIQD: 1.0000 | OROMUCOSAL | Status: DC | PRN

## 2023-01-19 MED ORDER — HEPARIN SODIUM (PORCINE) 1000 UNIT/ML DIALYSIS
1000.0000 [IU] | INTRAMUSCULAR | Status: DC | PRN
  Filled 2023-01-19: qty 1

## 2023-01-19 MED ORDER — PHENOL 1.4 % MT LIQD
1.0000 | OROMUCOSAL | Status: DC | PRN
  Administered 2023-01-19: 1 via OROMUCOSAL
  Filled 2023-01-19: qty 177

## 2023-01-19 MED ORDER — BUSPIRONE HCL 5 MG PO TABS
5.0000 mg | ORAL_TABLET | Freq: Three times a day (TID) | ORAL | Status: DC
  Administered 2023-01-19 – 2023-01-24 (×17): 5 mg
  Filled 2023-01-19 (×17): qty 1

## 2023-01-19 MED ORDER — PENTAFLUOROPROP-TETRAFLUOROETH EX AERO: 1.0000 | INHALATION_SPRAY | CUTANEOUS | Status: DC | PRN

## 2023-01-19 MED ORDER — LIDOCAINE-PRILOCAINE 2.5-2.5 % EX CREA: 1.0000 | TOPICAL_CREAM | CUTANEOUS | Status: DC | PRN

## 2023-01-19 MED ORDER — LIDOCAINE HCL (PF) 1 % IJ SOLN: 5.0000 mL | INTRAMUSCULAR | Status: DC | PRN

## 2023-01-19 MED ORDER — ALTEPLASE 2 MG IJ SOLR: 2.0000 mg | Freq: Once | INTRAMUSCULAR | Status: DC | PRN

## 2023-01-19 NOTE — Progress Notes (Signed)
Nutrition Follow-up  DOCUMENTATION CODES:   Non-severe (moderate) malnutrition in context of acute illness/injury  INTERVENTION:  Continue TF via cortrak tube: Glucerna 1.5 at 21ml/hr (1200 ml per day) Goal tube feeding regimen provides 1800 kcal, 99 gm protein, 911 ml free water daily Renavite daily for increased micronutrient needs with HD Continue Dys 2 Diet Continue Ensure Enlive po BID, each supplement provides 350 kcal and 20 grams of protein.  NUTRITION DIAGNOSIS:   Moderate Malnutrition related to acute illness (n/v, recurrent aspiration PNA) as evidenced by mild fat depletion, moderate muscle depletion, mild muscle depletion.  GOAL:   Patient will meet greater than or equal to 90% of their needs - Met with TF.   MONITOR:   Labs, Weight trends, Diet advancement, TF tolerance  REASON FOR ASSESSMENT:   Ventilator Assessment of nutrition requirement/status  ASSESSMENT:   77 y.o. male admits related to nausea and vomiting, and cough. PMH includes: T2DM, HTN, COPD. Pt is currently receiving medical management related to pneumonia.  Meds reviewed: colace, sliding scale insulin, semglee, solu-medrol, Rena-vit, miralax, florastor, senokot. Labs reviewed: K high, BUN/Creatinine elevated, phos elevated.   The pt reports that he has been tolerating small amounts of food. He states that he is taking it easy and trying not to eat too fast. TF were infusing as ordered. RN reports that the p is tolerating well with no issues. Pt has been receiving intermittent HD. Renal labs are currently elevated. RD will continue to closely monitor PO intakes. Recommend keeping tube feeds at goal rate over weekend since PO intakes are still pretty minimal. Will plan to reassess early next wek to see if TF can be cut back with continued improvements in PO intakes.   Diet Order:   Diet Order             DIET DYS 2 Room service appropriate? Yes with Assist; Fluid consistency: Thin  Diet effective  now                   EDUCATION NEEDS:   No education needs have been identified at this time  Skin:  Skin Assessment: Reviewed RN Assessment  Last BM:  7/11 - type 6  Height:   Ht Readings from Last 1 Encounters:  01/06/23 5\' 5"  (1.651 m)    Weight:   Wt Readings from Last 1 Encounters:  01/16/23 66 kg    Ideal Body Weight:  61.8 kg  BMI:  Body mass index is 24.21 kg/m.  Estimated Nutritional Needs:   Kcal:  1800-2000  Protein:  95-110 g/d  Fluid:  1.8L  Lezette Kitt[place] Bing, RD, LDN, CNSC.

## 2023-01-19 NOTE — Progress Notes (Signed)
Physical Therapy Treatment Patient Details Name: Travis Beck MRN: [id number] DOB: [birth year] Today's Date: 01/19/2023   History of Present Illness 77 yo male admitted 6/29 with SOB, Rt flank and back pain with multilobar PNA. 7/1 respiratory distress requiring intubation. 7/3 extubated. AKI progressing to require CRRT 7/5. PMhx: COPD, T2DM, HTN, SIADH, neuropathy, chronic back pain, anxiety    PT Comments  Pt greeted resting in bed, awake and alert and eager for mobility with continued progress towards acute goals. Pt able to come to sitting EOB with min guard, pt needing intermittent min A seated EOB to maintain feet on floor as pt with tendency for posterior and right lateral lean. Pt able to come to stand with light min A to steady and complete pre-gait activities in standing with cues for technique. Pt requiring min guard to step pivot to recliner. Pt continues to be mildly impulsive with mobility needing max cues for safety awareness throughout session.  Pt continues to benefit from skilled PT services to progress toward functional mobility goals.      Assistance Recommended at Discharge Intermittent Supervision/Assistance  If plan is discharge home, recommend the following:  Can travel by private vehicle    A little help with walking and/or transfers;A little help with bathing/dressing/bathroom;Assistance with cooking/housework;Assist for transportation      Equipment Recommendations  None recommended by PT    Recommendations for Other Services       Precautions / Restrictions Precautions Precautions: Fall;Other (comment) Precaution Comments: watch O2 Restrictions Weight Bearing Restrictions: No     Mobility  Bed Mobility Overal bed mobility: Needs Assistance Bed Mobility: Supine to Sit     Supine to sit: Min guard, HOB elevated     General bed mobility comments: increased time, mod cues for initiation    Transfers Overall transfer level: Needs  assistance Equipment used: Rolling walker (2 wheels) Transfers: Sit to/from Stand, Bed to chair/wheelchair/BSC Sit to Stand: Min guard   Step pivot transfers: Min guard       General transfer comment: transfer from EOB to chair using RW with min guard A and mod verbal cues for hand placement. Step pivot transfer from EOB>chair using RW with min guard A and mod A for sequencing task.    Ambulation/Gait             Pre-gait activities: marching x2 bouts ~30 seconds each     Stairs             Wheelchair Mobility     Tilt Bed    Modified Rankin (Stroke Patients Only)       Balance Overall balance assessment: Needs assistance Sitting-balance support: Feet supported, No upper extremity supported Sitting balance-Leahy Scale: Fair Sitting balance - Comments: sitting EOB   Standing balance support: Bilateral upper extremity supported, During functional activity, Reliant on assistive device for balance Standing balance-Leahy Scale: Poor Standing balance comment: BUE support on RW for stability                            Cognition Arousal/Alertness: Awake/alert Behavior During Therapy: WFL for tasks assessed/performed, Anxious Overall Cognitive Status: Impaired/Different from baseline Area of Impairment: Orientation, Memory, Safety/judgement, Awareness, Following commands, Attention                 Orientation Level: Disoriented to, Time (stating August) Current Attention Level: Sustained Memory: Decreased short-term memory Following Commands: Follows one step commands with increased time Safety/Judgement: Decreased awareness  of deficits Awareness: Emergent   General Comments: pt intermittenly anxious        Exercises      General Comments General comments (skin integrity, edema, etc.): VSS on 6L (poor pleth reading intermittently, pt asymptommatic no DOE noted)      Pertinent Vitals/Pain Pain Assessment Pain Assessment: No/denies  pain Pain Intervention(s): Monitored during session    Home Living                          Prior Function            PT Goals (current goals can now be found in the care plan section) Acute Rehab PT Goals Patient Stated Goal: return home PT Goal Formulation: With patient/family Time For Goal Achievement: 01/29/23 Progress towards PT goals: Progressing toward goals    Frequency    Min 3X/week      PT Plan      Co-evaluation              AM-PAC PT "6 Clicks" Mobility   Outcome Measure  Help needed turning from your back to your side while in a flat bed without using bedrails?: A Little Help needed moving from lying on your back to sitting on the side of a flat bed without using bedrails?: A Little Help needed moving to and from a bed to a chair (including a wheelchair)?: A Little Help needed standing up from a chair using your arms (e.g., wheelchair or bedside chair)?: A Little Help needed to walk in hospital room?: A Little Help needed climbing 3-5 steps with a railing? : Total 6 Click Score: 16    End of Session Equipment Utilized During Treatment: Oxygen Activity Tolerance: Patient tolerated treatment well Patient left: in chair;with call bell/phone within reach;with family/visitor present;with nursing/sitter in room Nurse Communication: Mobility status PT Visit Diagnosis: Other abnormalities of gait and mobility (R26.89);Muscle weakness (generalized) (M62.81)     Time: 1610-9604 PT Time Calculation (min) (ACUTE ONLY): 27 min  Charges:    $Therapeutic Activity: 23-37 mins PT General Charges $$ ACUTE PT VISIT: 1 Visit                     Ciarrah Rae R. PTA Acute Rehabilitation Services Office: [phone]   Catalina Antigua 01/19/2023, 3:14 PM

## 2023-01-19 NOTE — Progress Notes (Signed)
Patient ID: Travis Beck, male   DOB: [birth year], 77 y.o.   MRN: [id number]    Progress Note from the Palliative Medicine Team at [facility]   Patient Name: Travis Beck        Date: 01/19/2023 DOB: Dec 19, 1945  Age: 77 y.o. MRN#: [id number] Attending Physician: Tomma Lightning, MD Primary Care Physician: Kirstie Peri, MD Admit Date: 01/06/2023    Extensive chart review has been completed prior to meeting with patient/family  including labs, vital signs, imaging, progress/consult notes, orders, medications and available advance directive documents.   77 y.o. male   admitted on 01/06/2023 with past   medical history significant of IIDM, HTN, SIDAH, COPD,  former smoker quit 1994 with a 66 pack year smoking history, diabetic neuropathy, recurrent pneumonia presented 01/06/2023 with worsening nausea /, cough and shortness of breath.    Patient was recently hospitalized 5/23-5/26 for multifocal MRSA pneumonia  and septic shock requiring vasopressor on admission-stabilized and subsequently discharged home on Augmentin/doxycycline .    Pt returned to [facility] 12/07/2022 with fever of 103.1 and ongoing left sided pleuritic pain. He has a small effusion that was not big enough to tap.  He was admitted for incompletely treated PNA-as sputum cultures finalized postdischarge, and Doxycycline did not cover MRSA. He was treated with Zyvox, and discharged home on Zyvox p.o on 12/13/2022.    Initially his breathing symptoms improved with Zyvox however patient continued to experience frequent nausea, vomiting and cough. He presented to the ED 6/29 as these symptoms were worsening.   01/06/23 swallow evaluation significant for esophageal dysphagia  01/08/23 worsening chest x-ray, significant for extensive right lung, mild left mid lung and left lower lobe interstitial airspace opacities.  Required intubation  01/10/2023-successfully  extubated  01/11/2023-increasing oxygen needs now on  100% O2, increased agitation, worsening renal function/creatinine 5.28  01/12/2023 CRRT started, pressors  01/19/23 day 13 of this hospitalization.  Patient remains with core track for nutritional support, he is high risk for aspiration secondary to dysphagia.  Patient is high risk for decompensation.  This NP assessed patient at the bedside as a follow up  for palliative medicine needs and emotional support.  Patient's sister Hilda Lias, and niece/Paula at bedside.  Patient is alert.   Continued education regarding the seriousness of patient's current medical situation and is high risk for decompensation secondary to his multiple co-morbidities; dysphagia, COPD, recurrent aspiration pneumonia, worsening renal function,  weakness and overall failure to thrive.   Education offered on concept of dysphagia and associated aspiration pneumonia risk.  Education offered on concept specific to adult failure to thrive and the limitations of medical interventions to prolong quality of life when the body does fail to thrive.  Explored patient's hemoptysis and possible underlying causes.   Dr Wynona Neat also spoke with family.    Family  at bedside are optimistic  and hope for patient's continued improvement.  Questions and concerns addressed.     Plan of Care: -Full code     -Educated family to consider DNR/DNI status understanding evidenced based poor outcomes in similar hospitalized patient, as the cause of arrest is likely associated with advanced chronic illness rather than an easily reversible acute cardio-pulmonary event. -Family is open to all offered and available medical interventions to prolong life. -Ongoing conversation regarding goals of care Education offered today regarding  the importance of continued conversation with family members  and the  medical providers regarding overall plan of care and treatment options,  ensuring decisions are within the context of the patients values and  GOCs.   Patient and family will need ongoing support as they navigate healthcare decisions.  Questions and concerns addressed   Discussed with  bedside RN  PMT will continue to support holistically  Time: 55  minutes  Detailed review of medical records ( labs, imaging, vital signs), medically appropriate exam ( MS, skin, resp)   discussed with treatment team, counseling and education to patient, family, staff, documenting clinical information, medication management, coordination of care   Discussed with Dr Serafina Mitchell NP  Palliative Medicine Team Team Phone # [phone] Pager [phone]

## 2023-01-19 NOTE — Plan of Care (Signed)
  Problem: Education: Goal: Ability to describe self-care measures that may prevent or decrease complications (Diabetes Survival Skills Education) will improve Outcome: Progressing Goal: Individualized Educational Video(s) Outcome: Progressing   Problem: Coping: Goal: Ability to adjust to condition or change in health will improve Outcome: Progressing   Problem: Fluid Volume: Goal: Ability to maintain a balanced intake and output will improve Outcome: Progressing   Problem: Health Behavior/Discharge Planning: Goal: Ability to identify and utilize available resources and services will improve Outcome: Progressing Goal: Ability to manage health-related needs will improve Outcome: Progressing   Problem: Metabolic: Goal: Ability to maintain appropriate glucose levels will improve Outcome: Progressing   Problem: Nutritional: Goal: Maintenance of adequate nutrition will improve Outcome: Progressing Goal: Progress toward achieving an optimal weight will improve Outcome: Progressing   Problem: Skin Integrity: Goal: Risk for impaired skin integrity will decrease Outcome: Progressing   Problem: Tissue Perfusion: Goal: Adequacy of tissue perfusion will improve Outcome: Progressing   Problem: Education: Goal: Knowledge of General Education information will improve Description: Including pain rating scale, medication(s)/side effects and non-pharmacologic comfort measures Outcome: Progressing   Problem: Health Behavior/Discharge Planning: Goal: Ability to manage health-related needs will improve Outcome: Progressing   Problem: Clinical Measurements: Goal: Ability to maintain clinical measurements within normal limits will improve Outcome: Progressing Goal: Will remain free from infection Outcome: Progressing Goal: Diagnostic test results will improve Outcome: Progressing Goal: Respiratory complications will improve Outcome: Progressing Goal: Cardiovascular complication will  be avoided Outcome: Progressing   Problem: Activity: Goal: Risk for activity intolerance will decrease Outcome: Progressing   Problem: Nutrition: Goal: Adequate nutrition will be maintained Outcome: Progressing   Problem: Coping: Goal: Level of anxiety will decrease Outcome: Progressing   Problem: Elimination: Goal: Will not experience complications related to bowel motility Outcome: Progressing Goal: Will not experience complications related to urinary retention Outcome: Progressing   Problem: Pain Managment: Goal: General experience of comfort will improve Outcome: Progressing   Problem: Safety: Goal: Ability to remain free from injury will improve Outcome: Progressing   Problem: Skin Integrity: Goal: Risk for impaired skin integrity will decrease Outcome: Progressing   Problem: Safety: Goal: Non-violent Restraint(s) Outcome: Progressing   

## 2023-01-19 NOTE — Progress Notes (Signed)
NAME:  Travis Beck, MRN:  [id number], DOB:  1946/02/06, LOS: 13 ADMISSION DATE:  01/06/2023, CONSULTATION DATE:  01/08/2023 REFERRING MD:  Marland Mcalpine, CHIEF COMPLAINT:  Aspiration Pneumonia, nausea and vomiting   History of Present Illness:  77 y.o. male with medical history significant of IIDM, HTN, SIDAH, Asthma, (Former smoker quit 1994 with a 66 pack year smoking history diabetic neuropathy, recurrent pneumonia presented 01/06/2023 with worsening of nauseous vomiting cough and shortness of breath.  Patient was recently hospitalized 5/23-5/26 for multifocal MRSA pneumonia  and septic shock requiring vasopressor on admission-stabilized and subsequently discharged home on Augmentin/doxycycline . Pt returned to [facility] 12/07/2022 with fever of 103.1 and ongoing left sided pleuritic pain. He has a small effusion that was not big enough to tap.  He was admitted for incompletely treated PNA-as sputum cultures finalized postdischarge, and Doxycycline did not cover MRSA. He was treated with Zyvox, and discharged home on Zyvox p.o on 12/13/2022. Initially his breathing symptoms improved with Zyvox however patient continued to experience frequent nausea, vomiting and cough. He presented to the ED 6/29 as these symptoms were worsening.  In the ED he was mildly febrile at 99.6, He had  tachycardia, oxygen sats were 93% on 3 L. CXR again showed a multifocal pneumonia, right sided. Na 128/ Creatinine of 4.2, BUN 66 and WBC of 20. His HGB was 8.7.Respiratory Panel was negative. Lactic Acid trend since admission 2.7>>2.8>>1,3 Pulmonary was  asked to consult for recurrent vs slow to resolve pneumonia and hypoxemia . Of note , patient is followed in the [facility]  Pulmonary clinic by Dr. Isaiah Serge and Rubye Oaks NP.  Per family patient has had about a 15 pound weight loss since the end of May 24 when pneumonia was initially diagnosed.  Pertinent  Medical History  Arthritis, Asthma, HTN, DM type  2  Significant Hospital Events: Including procedures, antibiotic start and stop dates in addition to other pertinent events   5/23-5/26 hospitalization for septic shock from multilobar PNA 5/30 -6/5 fever 103 F at home-ongoing left-sided pleuritic chest pain 6/29 Recurrent pneumonitis 7/01 intubated 7/03 extubated 7/04 complete ABx 7/05 start CRRT, off pressors 7/06 start steroids 7/11 blood back to the ICU for desaturations  Interim History / Subjective:  Oxygen levels have remained stable Did have some hemoptysis  Objective   BP 123/64   Pulse 81   Temp 97.6 F (36.4 C) (Oral)   Resp 16   Ht 5\' 5"  (1.651 m)   Wt 66 kg   SpO2 98%   BMI 24.21 kg/m .   Examination: General -chronically ill-appearing HEENT-dry oral mucosa Cardiac -S1-S2 appreciated Chest -does have rales bilaterally  abdomen -soft, bowel sounds appreciated Extremities -no edema Skin:  warm, dry Neuro - awake, alert, answering questions appropraitely   Chest x-ray reviewed, stable from previous  Labs reviewed  Resolved Hospital Problem list     Assessment & Plan:   Acute hypoxic respiratory failure with multifocal pneumonitis related to aspiration Possible TRALI after transfusion on 7/10 -Continues on diuretics -BiPAP as needed -Continue steroids -Continue bronchodilators  Acute kidney injury Hyponatremia Uremia -Appreciate nephrology follow-up -Plan is to have dialysis today  Acute metabolic encephalopathy secondary to hypoxia and renal failure Chronic pain Depression Anxiety -Continue usual medications including Neurontin, Suboxone, trazodone -On clonazepam twice daily -BuSpar was added for his anxiety  Asthma -Continue inhalers -Albuterol as needed  Esophageal dysmotility -He has a tube feed in place for nutrition -Appreciate dietary continue to follow-up -Significant risk  of aspiration going forward -Continue PPI  Severe protein calorie malnutrition -Continue tube  feeds  Iron deficiency anemia -Continue to monitor  Type 2 diabetes -Continue SSI -Continue glargine  Goals of care discussions ongoing  History of hyperlipidemia -On pravastatin  Family was updated I did reassure them that we are doing everything we can to have him stabilize They had questions about a bronchoscopy which I do not believe will be a good idea in somebody who is making significant strides.  Multifactorial reasons for the hemoptysis, it is small, will continue to monitor H&H has remained stable -A bronchoscopy will be placing him at significant risk of ending up on the ventilator and not able to wean  Best Practice (right click and "Reselect all SmartList Selections" daily)   Diet/type: tube feeds DVT prophylaxis: SQ heparin GI prophylaxis: protonix Lines: Rt internal jugular HD catheter Foley:  Yes, still needed Code Status:  full code Last date of multidisciplinary goals of care discussion (updated family at bedside 7/11)  Labs       Latest Ref Rng & Units 01/19/2023    4:03 AM 01/18/2023    3:45 PM 01/18/2023    7:38 AM  CMP  Glucose 70 - 99 mg/dL 89  161  096   BUN 8 - 23 mg/dL 94  88  80   Creatinine 0.61 - 1.24 mg/dL 0.45  4.09  8.11   Sodium 135 - 145 mmol/L 137  135  134   Potassium 3.5 - 5.1 mmol/L 5.2  5.3  5.1   Chloride 98 - 111 mmol/L 95  98  99   CO2 22 - 32 mmol/L 24  25  25    Calcium 8.9 - 10.3 mg/dL 8.0  7.5  7.6       Latest Ref Rng & Units 01/18/2023    7:38 AM 01/17/2023    5:10 PM 01/17/2023    1:32 AM  CBC  WBC 4.0 - 10.5 K/uL 18.1   16.1   Hemoglobin 13.0 - 17.0 g/dL 8.1  8.9  6.8  C  Hematocrit 39.0 - 52.0 % 24.9  27.1  20.9   Platelets 150 - 400 K/uL 578   PLATELET CLUMPS NOTED ON SMEAR, COUNT APPEARS DECREASED     C Corrected result    ABG    Component Value Date/Time   PHART 7.213 (L) 01/09/2023 0009   PCO2ART 52.9 (H) 01/09/2023 0009   PO2ART 170 (H) 01/09/2023 0009   HCO3 28.9 (H) 01/16/2023 0750   TCO2 23  01/09/2023 0009   ACIDBASEDEF 2.3 (H) 01/13/2023 0928   O2SAT 91.7 01/16/2023 0750    CBG (last 3)  Recent Labs    01/18/23 2318 01/19/23 0345 01/19/23 0815  GLUCAP 107* 83 166*   The patient is critically ill with multiple organ systems failure and requires high complexity decision making for assessment and support, frequent evaluation and titration of therapies, application of advanced monitoring technologies and extensive interpretation of multiple databases. Critical Care Time devoted to patient care services described in this note independent of APP/resident time (if applicable)  is 33 minutes.   Virl Diamond MD [facility] Personal pager: See Amion If unanswered, please page CCM On-call: #[phone]

## 2023-01-19 NOTE — Progress Notes (Addendum)
Patient ID: Travis Beck, male   DOB: 02-16-1946, 77 y.o.   MRN: [id number] S: UOP up to yesterday with lasix 80 IV. No confusion or nausea.  Coughing up some blood and throat feels irritated.   It is traces on a tissue.      O:BP 123/64   Pulse 81   Temp 97.6 F (36.4 C) (Oral)   Resp 16   Ht 5\' 5"  (1.651 m)   Wt 66 kg   SpO2 98%   BMI 24.21 kg/m   Intake/Output Summary (Last 24 hours) at 01/19/2023 0846 Last data filed at 01/19/2023 0800 Gross per 24 hour  Intake 431.67 ml  Output 1150 ml  Net -718.33 ml    Gen: awake in bed, NG in place with TF CVS: RRR Resp: occ rhonchi Abd: +BS, soft, NT/ND Ext: trace pretibial edema  Recent Labs  Lab 01/16/23 0102 01/16/23 1608 01/17/23 0132 01/17/23 1710 01/18/23 0738 01/18/23 1545 01/19/23 0403  NA 136 135 133* 133* 134* 135 137  K 4.9 5.0 4.4 5.4* 5.1 5.3* 5.2*  CL 100 99 101 98 99 98 95*  CO2 22 24 22 24 25 25 24   GLUCOSE 174* 221* 145* 196* 122* 143* 89  BUN 32* 42* 54* 68* 80* 88* 94*  CREATININE 1.25* 1.80* 2.04* 2.43* 2.84* 2.99* 3.02*  ALBUMIN 1.8* 1.7* 1.7* 1.8* 1.7* 1.7* 1.8*  CALCIUM 8.2* 7.7* 7.5* 7.7* 7.6* 7.5* 8.0*  PHOS 1.8* 6.0* 5.1* 5.1* 6.3* 6.8* 7.8*   Liver Function Tests: Recent Labs  Lab 01/18/23 0738 01/18/23 1545 01/19/23 0403  ALBUMIN 1.7* 1.7* 1.8*   No results for input(s): "LIPASE", "AMYLASE" in the last 168 hours. No results for input(s): "AMMONIA" in the last 168 hours. CBC: Recent Labs  Lab 01/14/23 0210 01/15/23 0059 01/16/23 0102 01/17/23 0132 01/17/23 1710 01/18/23 0738  WBC 16.3* 15.0* 24.2* 16.1*  --  18.1*  HGB 7.2* 7.0* 7.4* 6.8* 8.9* 8.1*  HCT 21.8* 21.3* 21.9* 20.9* 27.1* 24.9*  MCV 85.8 87.3 86.2 88.9  --  85.9  PLT 560* 614* 570* PLATELET CLUMPS NOTED ON SMEAR, COUNT APPEARS DECREASED  --  578*   Cardiac Enzymes: No results for input(s): "CKTOTAL", "CKMB", "CKMBINDEX", "TROPONINI" in the last 168 hours. CBG: Recent Labs  Lab 01/18/23 1518  01/18/23 1950 01/18/23 2318 01/19/23 0345 01/19/23 0815  GLUCAP 141* 100* 107* 83 166*    Iron Studies: No results for input(s): "IRON", "TIBC", "TRANSFERRIN", "FERRITIN" in the last 72 hours. Studies/Results: DG CHEST PORT 1 VIEW  Result Date: 01/17/2023 CLINICAL DATA:  Hypoxia, respiratory distress EXAM: PORTABLE CHEST 1 VIEW COMPARISON:  01/16/2023 FINDINGS: Right dialysis catheter and feeding tube remain in place. Severe diffuse bilateral airspace disease, stable since prior study. Heart mediastinal contours are within normal limits. No visible effusions. No acute bony abnormality. IMPRESSION: Severe diffuse bilateral airspace disease, unchanged. Electronically Signed   By: Charlett Nose M.D.   On: 01/17/2023 22:24    arformoterol  15 mcg Nebulization BID   aspirin  81 mg Per Tube Daily   budesonide (PULMICORT) nebulizer solution  0.5 mg Nebulization BID   Chlorhexidine Gluconate Cloth  6 each Topical Daily   clonazepam  1 mg Per Tube BID   docusate  100 mg Per Tube BID   feeding supplement  237 mL Oral BID BM   fluticasone  2 spray Each Nare Daily   heparin  5,000 Units Subcutaneous Q8H   insulin aspart  0-20 Units Subcutaneous Q4H  insulin glargine-yfgn  10 Units Subcutaneous BID   lidocaine  1 patch Transdermal Q24H   loratadine  10 mg Per Tube Q48H   methylPREDNISolone (SOLU-MEDROL) injection  70 mg Intravenous Daily   Followed by   Melene Muller ON 01/22/2023] methylPREDNISolone (SOLU-MEDROL) injection  60 mg Intravenous Daily   Followed by   Melene Muller ON 01/25/2023] methylPREDNISolone (SOLU-MEDROL) injection  50 mg Intravenous Daily   Followed by   Melene Muller ON 01/28/2023] predniSONE  40 mg Per Tube Q breakfast   Followed by   Melene Muller ON 01/31/2023] predniSONE  30 mg Per Tube Q breakfast   Followed by   Melene Muller ON 02/03/2023] predniSONE  20 mg Per Tube Q breakfast   Followed by   Melene Muller ON 02/06/2023] predniSONE  10 mg Per Tube Q breakfast   montelukast  10 mg Per Tube QHS   multivitamin   1 tablet Per Tube QHS   mouth rinse  15 mL Mouth Rinse 4 times per day   pantoprazole (PROTONIX) IV  40 mg Intravenous QHS   polyethylene glycol  17 g Per Tube Daily   pramipexole  0.5 mg Per Tube QHS   pravastatin  20 mg Per Tube QPM   revefenacin  175 mcg Nebulization Daily   saccharomyces boulardii  250 mg Per Tube BID   senna  2 tablet Per Tube QHS   traZODone  50 mg Per Tube QHS    BMET    Component Value Date/Time   NA 137 01/19/2023 0403   K 5.2 (H) 01/19/2023 0403   CL 95 (L) 01/19/2023 0403   CO2 24 01/19/2023 0403   GLUCOSE 89 01/19/2023 0403   BUN 94 (H) 01/19/2023 0403   CREATININE 3.02 (H) 01/19/2023 0403   CALCIUM 8.0 (L) 01/19/2023 0403   GFRNONAA 20 (L) 01/19/2023 0403   GFRAA >60 12/15/2019 0812   CBC    Component Value Date/Time   WBC 18.1 (H) 01/18/2023 0738   RBC 2.90 (L) 01/18/2023 0738   HGB 8.1 (L) 01/18/2023 0738   HCT 24.9 (L) 01/18/2023 0738   PLT 578 (H) 01/18/2023 0738   MCV 85.9 01/18/2023 0738   MCH 27.9 01/18/2023 0738   MCHC 32.5 01/18/2023 0738   RDW 15.8 (H) 01/18/2023 0738   LYMPHSABS 0.5 (L) 01/12/2023 0047   MONOABS 0.5 01/12/2023 0047   EOSABS 0.0 01/12/2023 0047   BASOSABS 0.0 01/12/2023 0047    Assessment/Plan:  AKI - presumably ischemic ATN in setting of sepsis due to pneumonia and poor po intake. Vanco trough was high at 28.  UA with blood and protein but many bacteria and WBC too, low susp for pulm renal and serologies neg ANCA and antiGBM. BUN/Cr continued to climb.  CRRT initiated after right internal jugular catheter placed by PCCM which continued until 7/8 PM.  In setting of worsening azotemia will do HD today 7/12 for clearance, no UF needed If cont to need HD will need tunneled HD cath placed, in setting of improving UOP will follow over weekend OK with redosing lasix as clinically indicated.  Volume looks ok for now.  Avoid nephrotoxic medications including NSAIDs and iodinated intravenous contrast exposure unless the  latter is absolutely indicated.  Preferred narcotic agents for pain control are hydromorphone, fentanyl, and methadone. Morphine should not be used. Avoid Baclofen and avoid oral sodium phosphate and magnesium citrate based laxatives / bowel preps. Continue strict Input and Output monitoring. Will monitor the patient closely with you and intervene or adjust therapy as  indicated by changes in clinical status/labs   AMS - delerium off/on.  Currently ok.  Sepsis - due to recurrent multifocal pneumonia - suspected aspiration.   Acute hypoxic respiratory failure - required intubation now extubated.  Improving. Iron deficiency anemia - continue to follow, Hb 7, defer transfusion to primary.  Severe protein malnutrition - alb 1.5.  cortrak and supplements per PCCM. Esophageal dysmotility - speech path following.   Estill Bakes MD [facility] Pager [phone]

## 2023-01-20 DIAGNOSIS — J9601 Acute respiratory failure with hypoxia: Secondary | ICD-10-CM | POA: Diagnosis not present

## 2023-01-20 DIAGNOSIS — J189 Pneumonia, unspecified organism: Secondary | ICD-10-CM | POA: Diagnosis not present

## 2023-01-20 LAB — RENAL FUNCTION PANEL
Albumin: 1.8 g/dL — ABNORMAL LOW (ref 3.5–5.0)
Anion gap: 13 (ref 5–15)
BUN: 51 mg/dL — ABNORMAL HIGH (ref 8–23)
CO2: 26 mmol/L (ref 22–32)
Calcium: 7.4 mg/dL — ABNORMAL LOW (ref 8.9–10.3)
Chloride: 93 mmol/L — ABNORMAL LOW (ref 98–111)
Creatinine, Ser: 1.89 mg/dL — ABNORMAL HIGH (ref 0.61–1.24)
GFR, Estimated: 36 mL/min — ABNORMAL LOW (ref >60)
Glucose, Bld: 191 mg/dL — ABNORMAL HIGH (ref 70–99)
Phosphorus: 3.8 mg/dL (ref 2.5–4.6)
Potassium: 4 mmol/L (ref 3.5–5.1)
Sodium: 132 mmol/L — ABNORMAL LOW (ref 135–145)

## 2023-01-20 LAB — GLUCOSE, CAPILLARY
Glucose-Capillary: 157 mg/dL — ABNORMAL HIGH (ref 70–99)
Glucose-Capillary: 204 mg/dL — ABNORMAL HIGH (ref 70–99)
Glucose-Capillary: 210 mg/dL — ABNORMAL HIGH (ref 70–99)
Glucose-Capillary: 218 mg/dL — ABNORMAL HIGH (ref 70–99)
Glucose-Capillary: 225 mg/dL — ABNORMAL HIGH (ref 70–99)
Glucose-Capillary: 294 mg/dL — ABNORMAL HIGH (ref 70–99)

## 2023-01-20 LAB — CBC
HCT: 25.4 % — ABNORMAL LOW (ref 39.0–52.0)
Hemoglobin: 8.2 g/dL — ABNORMAL LOW (ref 13.0–17.0)
MCH: 28.4 pg (ref 26.0–34.0)
MCHC: 32.3 g/dL (ref 30.0–36.0)
MCV: 87.9 fL (ref 80.0–100.0)
Platelets: 428 10*3/uL — ABNORMAL HIGH (ref 150–400)
RBC: 2.89 MIL/uL — ABNORMAL LOW (ref 4.22–5.81)
RDW: 15.8 % — ABNORMAL HIGH (ref 11.5–15.5)
WBC: 13.7 10*3/uL — ABNORMAL HIGH (ref 4.0–10.5)
nRBC: 0 % (ref 0.0–0.2)

## 2023-01-20 LAB — MAGNESIUM: Magnesium: 1.8 mg/dL (ref 1.7–2.4)

## 2023-01-20 LAB — HEPATITIS B SURFACE ANTIBODY, QUANTITATIVE: Hep B S AB Quant (Post): <3.5 m[IU]/mL — ABNORMAL LOW

## 2023-01-20 NOTE — Progress Notes (Signed)
Received patient in bed to unit.  Alert and oriented.  Informed consent signed and in chart.   TX duration: 3 hours  Patient tolerated well.  Transported back to the room  Alert, without acute distress.  Hand-off given to patient's nurse.   Access used: RIJ TDC Access issues: None  Total UF removed: 0  Medication(s) given: None Post HD VS: BP 136/72 Post HD weight: Unable to obtain   [facility]    01/20/23 0100  Vitals  Temp 98.3 F (36.8 C)  Temp Source Oral  BP 136/72  MAP (mmHg) 90  BP Location Left Arm  BP Method Automatic  Patient Position (if appropriate) Lying  Pulse Rate 80  Pulse Rate Source Monitor  ECG Heart Rate 79  Resp 15  Oxygen Therapy  SpO2 96 %  O2 Device Nasal Cannula  O2 Flow Rate (L/min) 4 L/min  Patient Activity (if Appropriate) In bed  Pulse Oximetry Type Continuous  Post Treatment  Dialyzer Clearance Lightly streaked  Duration of HD Treatment -hour(s) 3 hour(s)  Hemodialysis Intake (mL) 0 mL  Liters Processed 71.9  Fluid Removed (mL) 0 mL  Tolerated HD Treatment Yes  Note  Patient Observations Patient resting, easily startles no c/o voiced A&O x 4. VSS. Patient condition stable for this d/c.  Hemodialysis Catheter Right Internal jugular Double lumen Temporary (Non-Tunneled)  Placement Date/Time: 01/12/23 1100   Time Out: Correct patient;Correct site;Correct procedure  Maximum sterile barrier precautions: Hand hygiene;Cap;Mask;Sterile gown;Sterile gloves;Large sterile sheet  Site Prep: Chlorhexidine (preferred)  Local Anes...  Site Condition No complications  Blue Lumen Status Flushed;Dead end cap in place;Heparin locked  Red Lumen Status Flushed;Dead end cap in place;Heparin locked  Catheter fill solution Heparin 1000 units/ml  Catheter fill volume (Arterial) 1.2 cc  Catheter fill volume (Venous) 1.2  Dressing Type Transparent  Dressing Status Antimicrobial disc in place;Clean, Dry, Intact  Drainage  Description None  Post treatment catheter status Capped and Clamped

## 2023-01-20 NOTE — Progress Notes (Signed)
Patient ID: Travis Beck, male   DOB: February 15, 1946, 77 y.o.   MRN: [id number] S: h/o HD overnight - only c/o this AM is sore throat. UF none. UOP yesterday 1250.   O:BP 119/72   Pulse 81   Temp 97.7 F (36.5 C) (Oral)   Resp 20   Ht 5\' 5"  (1.651 m)   Wt 66 kg   SpO2 100%   BMI 24.21 kg/m   Intake/Output Summary (Last 24 hours) at 01/20/2023 1040 Last data filed at 01/20/2023 1000 Gross per 24 hour  Intake 1290 ml  Output 1250 ml  Net 40 ml    Gen: awake in bed, NG in place with TF CVS: RRR Resp: occ rhonchi Abd: +BS, soft, NT/ND Ext: no dema  Recent Labs  Lab 01/17/23 0132 01/17/23 1710 01/18/23 0738 01/18/23 1545 01/19/23 0403 01/19/23 2034 01/20/23 0732  NA 133* 133* 134* 135 137 131* 132*  K 4.4 5.4* 5.1 5.3* 5.2* 5.2* 4.0  CL 101 98 99 98 95* 95* 93*  CO2 22 24 25 25 24 23 26   GLUCOSE 145* 196* 122* 143* 89 207* 191*  BUN 54* 68* 80* 88* 94* 105* 51*  CREATININE 2.04* 2.43* 2.84* 2.99* 3.02* 3.06* 1.89*  ALBUMIN 1.7* 1.8* 1.7* 1.7* 1.8* 1.8* 1.8*  CALCIUM 7.5* 7.7* 7.6* 7.5* 8.0* 7.4* 7.4*  PHOS 5.1* 5.1* 6.3* 6.8* 7.8* 6.6* 3.8   Liver Function Tests: Recent Labs  Lab 01/19/23 0403 01/19/23 2034 01/20/23 0732  ALBUMIN 1.8* 1.8* 1.8*   No results for input(s): "LIPASE", "AMYLASE" in the last 168 hours. No results for input(s): "AMMONIA" in the last 168 hours. CBC: Recent Labs  Lab 01/16/23 0102 01/17/23 0132 01/17/23 1710 01/18/23 0738 01/19/23 2034 01/20/23 0732  WBC 24.2* 16.1*  --  18.1* 12.9* 13.7*  HGB 7.4* 6.8*   < > 8.1* 7.3* 8.2*  HCT 21.9* 20.9*   < > 24.9* 23.1* 25.4*  MCV 86.2 88.9  --  85.9 89.5 87.9  PLT 570* PLATELET CLUMPS NOTED ON SMEAR, COUNT APPEARS DECREASED  --  578* 424* 428*   < > = values in this interval not displayed.   Cardiac Enzymes: No results for input(s): "CKTOTAL", "CKMB", "CKMBINDEX", "TROPONINI" in the last 168 hours. CBG: Recent Labs  Lab 01/19/23 1543 01/19/23 1935 01/19/23 2322 01/20/23 0312  01/20/23 0729  GLUCAP 207* 236* 91 157* 204*    Iron Studies: No results for input(s): "IRON", "TIBC", "TRANSFERRIN", "FERRITIN" in the last 72 hours. Studies/Results: No results found.  arformoterol  15 mcg Nebulization BID   aspirin  81 mg Per Tube Daily   budesonide (PULMICORT) nebulizer solution  0.5 mg Nebulization BID   busPIRone  5 mg Per Tube TID   Chlorhexidine Gluconate Cloth  6 each Topical Daily   Chlorhexidine Gluconate Cloth  6 each Topical Q0600   clonazepam  1 mg Per Tube BID   docusate  100 mg Per Tube BID   feeding supplement  237 mL Oral BID BM   fluticasone  2 spray Each Nare Daily   heparin  5,000 Units Subcutaneous Q8H   insulin aspart  0-20 Units Subcutaneous Q4H   insulin glargine-yfgn  10 Units Subcutaneous BID   lidocaine  1 patch Transdermal Q24H   loratadine  10 mg Per Tube Q48H   methylPREDNISolone (SOLU-MEDROL) injection  70 mg Intravenous Daily   Followed by   Melene Muller ON 01/22/2023] methylPREDNISolone (SOLU-MEDROL) injection  60 mg Intravenous Daily   Followed by   [  START ON 01/25/2023] methylPREDNISolone (SOLU-MEDROL) injection  50 mg Intravenous Daily   Followed by   Melene Muller ON 01/28/2023] predniSONE  40 mg Per Tube Q breakfast   Followed by   Melene Muller ON 01/31/2023] predniSONE  30 mg Per Tube Q breakfast   Followed by   Melene Muller ON 02/03/2023] predniSONE  20 mg Per Tube Q breakfast   Followed by   Melene Muller ON 02/06/2023] predniSONE  10 mg Per Tube Q breakfast   montelukast  10 mg Per Tube QHS   multivitamin  1 tablet Per Tube QHS   mouth rinse  15 mL Mouth Rinse 4 times per day   pantoprazole (PROTONIX) IV  40 mg Intravenous QHS   polyethylene glycol  17 g Per Tube Daily   pramipexole  0.5 mg Per Tube QHS   pravastatin  20 mg Per Tube QPM   revefenacin  175 mcg Nebulization Daily   saccharomyces boulardii  250 mg Per Tube BID   senna  2 tablet Per Tube QHS   traZODone  50 mg Per Tube QHS    BMET    Component Value Date/Time   NA 132 (L)  01/20/2023 0732   K 4.0 01/20/2023 0732   CL 93 (L) 01/20/2023 0732   CO2 26 01/20/2023 0732   GLUCOSE 191 (H) 01/20/2023 0732   BUN 51 (H) 01/20/2023 0732   CREATININE 1.89 (H) 01/20/2023 0732   CALCIUM 7.4 (L) 01/20/2023 0732   GFRNONAA 36 (L) 01/20/2023 0732   GFRAA >60 12/15/2019 0812   CBC    Component Value Date/Time   WBC 13.7 (H) 01/20/2023 0732   RBC 2.89 (L) 01/20/2023 0732   HGB 8.2 (L) 01/20/2023 0732   HCT 25.4 (L) 01/20/2023 0732   PLT 428 (H) 01/20/2023 0732   MCV 87.9 01/20/2023 0732   MCH 28.4 01/20/2023 0732   MCHC 32.3 01/20/2023 0732   RDW 15.8 (H) 01/20/2023 0732   LYMPHSABS 0.5 (L) 01/12/2023 0047   MONOABS 0.5 01/12/2023 0047   EOSABS 0.0 01/12/2023 0047   BASOSABS 0.0 01/12/2023 0047    Assessment/Plan:  AKI - presumably ischemic ATN in setting of sepsis due to pneumonia and poor po intake. Vanco trough was high at 28.  UA with blood and protein but many bacteria and WBC too, low susp for pulm renal and serologies neg ANCA and antiGBM. BUN/Cr continued to climb.  CRRT initiated after right internal jugular catheter placed by PCCM which continued until 7/8 PM.  In setting of worsening azotemia did HD today 7/13 for clearance, no UF needed If cont to need HD will need tunneled HD cath placed, in setting of improving UOP will follow over weekend OK with redosing lasix as clinically indicated.  Volume looks ok for now.  Avoid nephrotoxic medications including NSAIDs and iodinated intravenous contrast exposure unless the latter is absolutely indicated.  Preferred narcotic agents for pain control are hydromorphone, fentanyl, and methadone. Morphine should not be used. Avoid Baclofen and avoid oral sodium phosphate and magnesium citrate based laxatives / bowel preps. Continue strict Input and Output monitoring. Will monitor the patient closely with you and intervene or adjust therapy as indicated by changes in clinical status/labs   AMS - delerium off/on.   Currently ok.  Sepsis - due to recurrent multifocal pneumonia - suspected aspiration.   Acute hypoxic respiratory failure - required intubation now extubated.  Improving. Iron deficiency anemia - continue to follow, Hb 7, defer transfusion to primary.  Severe protein malnutrition -  alb 1.5.  cortrak and supplements per PCCM. Esophageal dysmotility - speech path following.   Estill Bakes MD [facility] Pager [phone]

## 2023-01-20 NOTE — Progress Notes (Signed)
Pharmacy ICU Bowel Regimen Consult Note   Current Inpatient Medications for Bowel Management:  Senna 8.6mg  qHS Docusate 100mg  BID Polyethylene glycol 17g QD  TF (Osmolite 1.5) 50 mL/hr (goal)  Assessment: Travis Beck is a 77 y.o. year old male admitted on 01/06/2023. Constipation identified as non-opioid-induced constipation . Bowel regimen assessment completed by Lequita Halt, RN on 7/13. Large BM on 7/11 AM, and 7/13 AM.   [x]  Bowel sounds present [x]  No abdominal tenderness  [x]  Passing gas   Plan: Continue current regimen as working effectively. Patient has had 1 BM (type 6) every other day for 3 days - will sign off bowel regimen.  Please re-consult if needed.   MD contacted (if needed): n/a  Thank you for allowing pharmacy to participate in this patient's care.  Link Snuffer, PharmD, BCPS, BCCCP Clinical Pharmacist  01/20/2023 1:00 PM

## 2023-01-20 NOTE — Progress Notes (Signed)
NAME:  Travis Beck, MRN:  [id number], DOB:  20-Nov-1945, LOS: 14 ADMISSION DATE:  01/06/2023, CONSULTATION DATE:  01/08/2023 REFERRING MD:  Marland Mcalpine, CHIEF COMPLAINT:  Aspiration Pneumonia, nausea and vomiting   History of Present Illness:  77 y.o. male with medical history significant of IIDM, HTN, SIDAH, Asthma, (Former smoker quit 1994 with a 66 pack year smoking history diabetic neuropathy, recurrent pneumonia presented 01/06/2023 with worsening of nauseous vomiting cough and shortness of breath.  Patient was recently hospitalized 5/23-5/26 for multifocal MRSA pneumonia  and septic shock requiring vasopressor on admission-stabilized and subsequently discharged home on Augmentin/doxycycline . [facility] 12/07[facility]2022 with fever of 103.1 and ongoing left sided pleuritic pain. He has a small effusion that was not big enough to tap.  He was admitted for incompletely treated PNA-as sputum cultures finalized postdischarge, and Doxycycline did not cover MRSA. He was treated with Zyvox, and discharged home on Zyvox p.o on 12/13/2022. Initially his breathing symptoms improved with Zyvox however patient continued to experience frequent nausea, vomiting and cough. He presented to the ED 6/29 as these symptoms were worsening.  In the ED he was mildly febrile at 99.6, He had  tachycardia, oxygen sats were 93% on 3 L. CXR again showed a multifocal pneumonia, right sided. Na 128/ Creatinine of 4.2, BUN 66 and WBC of 20. His HGB was 8.7.Respiratory Panel was negative. Lactic Acid trend since admission 2.7>>2.8>>1,3 Pulmonary was  asked to consult for recurrent vs slow to resolve pneumonia and hypoxemia . Of note , patient is followed in the [place]  [facility] clinic by Dr. Isaiah Serge and Rubye Oaks NP.  Per family patient has had about a 15 pound weight loss since the end of May 24 when pneumonia was initially diagnosed.  Pertinent  Medical History  Arthritis, Asthma, HTN, DM type  2  Significant Hospital Events: Including procedures, antibiotic start and stop dates in addition to other pertinent events   5/23-5/26 hospitalization for septic shock from multilobar PNA 5/30 -6/5 fever 103 F at home-ongoing left-sided pleuritic chest pain 6/29 Recurrent pneumonitis 7/01 intubated 7/03 extubated 7/04 complete ABx 7/05 start CRRT, off pressors 7/06 start steroids 7/11 blood back to the ICU for desaturations  Interim History / Subjective:  Oxygen requirement stable Denies hemoptysis at present Had dialysis 01/20/2023 Some abdominal discomfort  Objective   BP 119/72   Pulse 81   Temp 97.7 F (36.5 C) (Oral)   Resp 20   Ht 5\' 5"  (1.651 m)   Wt 66 kg   SpO2 100%   BMI 24.21 kg/m .   Examination: General -chronically ill-appearing HEENT-dry oral mucosa Cardiac -S1-S2 appreciated Chest -bibasal rales abdomen -soft, bowel sounds appreciated Extremities -no clubbing, no edema Skin: Skin is warm and dry Neuro - awake, alert, answering questions appropraitely   Chest x-ray reviewed, stable from previous  Labs reviewed White count 13.7, hematocrit of 25.4-stable  Resolved Hospital Problem list     Assessment & Plan:   Acute hypoxemic respiratory failure with multifocal pneumonitis related to aspiration Possible TRALI after transfusion and 7/10 -Continue BiPAP as needed -Continue steroids-on tapering doses, currently on 20, to switch to 10 mg on 30th -Continue bronchodilators  Acute kidney injury Hyponatremia Uremia -Postdialysis, tolerated well  Acute metabolic encephalopathy secondary to hypoxia and renal failure Chronic pain Depression Anxiety -On clonazepam -BuSpar added  History of asthma -Continue current inhalers  Esophageal dysmotility -Ongoing risk of aspiration -Continue PPI  Severe protein calorie malnutrition -Continue tube feeds  Iron deficiency anemia -Continue to monitor  Type 2 diabetes -Continue  glargine -Continue SSI  Ongoing goals of care discussions  History of hyperlipidemia -On pravastatin  If patient remains stable, may be able to discharge to [facility] on Monday  Family was updated I did reassure them that we are doing everything we can to have him stabilize They had questions about a bronchoscopy which I do not believe will be a good idea in somebody who is making significant strides.  Multifactorial reasons for the hemoptysis, it is small, will continue to monitor H&H has remained stable -A bronchoscopy will be placing him at significant risk of ending up on the ventilator and not able to wean  Best Practice (right click and "Reselect all SmartList Selections" daily)   Diet/type: tube feeds DVT prophylaxis: SQ heparin GI prophylaxis: protonix Lines: Rt internal jugular HD catheter Foley:  Yes, still needed Code Status:  full code Last date of multidisciplinary goals of care discussion (updated family at bedside 7/11)  Labs       Latest Ref Rng & Units 01/20/2023    7:32 AM 01/19/2023    8:34 PM 01/19/2023    4:03 AM  CMP  Glucose 70 - 99 mg/dL 161  096  89   BUN 8 - 23 mg/dL 51  045  94   Creatinine 0.61 - 1.24 mg/dL 4.09  8.11  9.14   Sodium 135 - 145 mmol/L 132  131  137   Potassium 3.5 - 5.1 mmol/L 4.0  5.2  5.2   Chloride 98 - 111 mmol/L 93  95  95   CO2 22 - 32 mmol/L 26  23  24    Calcium 8.9 - 10.3 mg/dL 7.4  7.4  8.0       Latest Ref Rng & Units 01/20/2023    7:32 AM 01/19/2023    8:34 PM 01/18/2023    7:38 AM  CBC  WBC 4.0 - 10.5 K/uL 13.7  12.9  18.1   Hemoglobin 13.0 - 17.0 g/dL 8.2  7.3  8.1   Hematocrit 39.0 - 52.0 % 25.4  23.1  24.9   Platelets 150 - 400 K/uL 428  424  578     ABG    Component Value Date/Time   PHART 7.213 (L) 01/09/2023 0009   PCO2ART 52.9 (H) 01/09/2023 0009   PO2ART 170 (H) 01/09/2023 0009   HCO3 28.9 (H) 01/16/2023 0750   TCO2 23 01/09/2023 0009   ACIDBASEDEF 2.3 (H) 01/13/2023 0928   O2SAT 91.7 01/16/2023 0750     CBG (last 3)  Recent Labs    01/19/23 2322 01/20/23 0312 01/20/23 0729  GLUCAP 91 157* 204*   The patient is critically ill with multiple organ systems failure and requires high complexity decision making for assessment and support, frequent evaluation and titration of therapies, application of advanced monitoring technologies and extensive interpretation of multiple databases. Critical Care Time devoted to patient care services described in this note independent of APP/resident time (if applicable)  is 32 minutes.   Virl Diamond MD [facility] Personal pager: See Amion If unanswered, please page CCM On-call: #[phone]

## 2023-01-21 DIAGNOSIS — J441 Chronic obstructive pulmonary disease with (acute) exacerbation: Secondary | ICD-10-CM | POA: Diagnosis not present

## 2023-01-21 DIAGNOSIS — R1319 Other dysphagia: Secondary | ICD-10-CM

## 2023-01-21 DIAGNOSIS — Z8719 Personal history of other diseases of the digestive system: Secondary | ICD-10-CM

## 2023-01-21 DIAGNOSIS — J45901 Unspecified asthma with (acute) exacerbation: Secondary | ICD-10-CM | POA: Diagnosis not present

## 2023-01-21 DIAGNOSIS — E440 Moderate protein-calorie malnutrition: Secondary | ICD-10-CM | POA: Diagnosis not present

## 2023-01-21 DIAGNOSIS — J690 Pneumonitis due to inhalation of food and vomit: Secondary | ICD-10-CM | POA: Diagnosis not present

## 2023-01-21 DIAGNOSIS — J189 Pneumonia, unspecified organism: Secondary | ICD-10-CM | POA: Diagnosis not present

## 2023-01-21 LAB — CBC
HCT: 22.9 % — ABNORMAL LOW (ref 39.0–52.0)
Hemoglobin: 7.4 g/dL — ABNORMAL LOW (ref 13.0–17.0)
MCH: 29 pg (ref 26.0–34.0)
MCHC: 32.3 g/dL (ref 30.0–36.0)
MCV: 89.8 fL (ref 80.0–100.0)
Platelets: 378 10*3/uL (ref 150–400)
RBC: 2.55 MIL/uL — ABNORMAL LOW (ref 4.22–5.81)
RDW: 16 % — ABNORMAL HIGH (ref 11.5–15.5)
WBC: 9.6 10*3/uL (ref 4.0–10.5)
nRBC: 0 % (ref 0.0–0.2)

## 2023-01-21 LAB — RENAL FUNCTION PANEL
Albumin: 1.7 g/dL — ABNORMAL LOW (ref 3.5–5.0)
Anion gap: 9 (ref 5–15)
BUN: 67 mg/dL — ABNORMAL HIGH (ref 8–23)
CO2: 27 mmol/L (ref 22–32)
Calcium: 7.3 mg/dL — ABNORMAL LOW (ref 8.9–10.3)
Chloride: 97 mmol/L — ABNORMAL LOW (ref 98–111)
Creatinine, Ser: 2.26 mg/dL — ABNORMAL HIGH (ref 0.61–1.24)
GFR, Estimated: 29 mL/min — ABNORMAL LOW (ref >60)
Glucose, Bld: 149 mg/dL — ABNORMAL HIGH (ref 70–99)
Phosphorus: 4 mg/dL (ref 2.5–4.6)
Potassium: 4.2 mmol/L (ref 3.5–5.1)
Sodium: 133 mmol/L — ABNORMAL LOW (ref 135–145)

## 2023-01-21 LAB — GLUCOSE, CAPILLARY
Glucose-Capillary: 156 mg/dL — ABNORMAL HIGH (ref 70–99)
Glucose-Capillary: 126 mg/dL — ABNORMAL HIGH (ref 70–99)
Glucose-Capillary: 145 mg/dL — ABNORMAL HIGH (ref 70–99)
Glucose-Capillary: 207 mg/dL — ABNORMAL HIGH (ref 70–99)
Glucose-Capillary: 247 mg/dL — ABNORMAL HIGH (ref 70–99)
Glucose-Capillary: 195 mg/dL — ABNORMAL HIGH (ref 70–99)

## 2023-01-21 LAB — MAGNESIUM: Magnesium: 1.8 mg/dL (ref 1.7–2.4)

## 2023-01-21 MED ORDER — AMLODIPINE BESYLATE 10 MG PO TABS
10.0000 mg | ORAL_TABLET | Freq: Every day | ORAL | Status: DC
  Administered 2023-01-21 – 2023-01-24 (×4): 10 mg
  Filled 2023-01-21 (×4): qty 1

## 2023-01-21 NOTE — Progress Notes (Signed)
Patient ID: Travis Beck, male   DOB: 1946-05-17, 77 y.o.   MRN: [id number] S: only c/o is NG tube discomfort, UOP 1350 yesterday.   O:BP (!) 144/84   Pulse 90   Temp 98.7 F (37.1 C) (Oral)   Resp 18   Ht 5\' 5"  (1.651 m)   Wt 70.2 kg   SpO2 94%   BMI 25.75 kg/m   Intake/Output Summary (Last 24 hours) at 01/21/2023 1100 Last data filed at 01/21/2023 0900 Gross per 24 hour  Intake 1815 ml  Output 1350 ml  Net 465 ml    Gen: awake in bed, NG in place with TF CVS: RRR Resp: occ rhonchi Abd: +BS, soft, NT/ND Ext: no edema  Recent Labs  Lab 01/17/23 1710 01/18/23 0738 01/18/23 1545 01/19/23 0403 01/19/23 2034 01/20/23 0732 01/21/23 0238  NA 133* 134* 135 137 131* 132* 133*  K 5.4* 5.1 5.3* 5.2* 5.2* 4.0 4.2  CL 98 99 98 95* 95* 93* 97*  CO2 24 25 25 24 23 26 27   GLUCOSE 196* 122* 143* 89 207* 191* 149*  BUN 68* 80* 88* 94* 105* 51* 67*  CREATININE 2.43* 2.84* 2.99* 3.02* 3.06* 1.89* 2.26*  ALBUMIN 1.8* 1.7* 1.7* 1.8* 1.8* 1.8* 1.7*  CALCIUM 7.7* 7.6* 7.5* 8.0* 7.4* 7.4* 7.3*  PHOS 5.1* 6.3* 6.8* 7.8* 6.6* 3.8 4.0   Liver Function Tests: Recent Labs  Lab 01/19/23 2034 01/20/23 0732 01/21/23 0238  ALBUMIN 1.8* 1.8* 1.7*   No results for input(s): "LIPASE", "AMYLASE" in the last 168 hours. No results for input(s): "AMMONIA" in the last 168 hours. CBC: Recent Labs  Lab 01/17/23 0132 01/17/23 1710 01/18/23 0738 01/19/23 2034 01/20/23 0732 01/21/23 0238  WBC 16.1*  --  18.1* 12.9* 13.7* 9.6  HGB 6.8*   < > 8.1* 7.3* 8.2* 7.4*  HCT 20.9*   < > 24.9* 23.1* 25.4* 22.9*  MCV 88.9  --  85.9 89.5 87.9 89.8  PLT PLATELET CLUMPS NOTED ON SMEAR, COUNT APPEARS DECREASED  --  578* 424* 428* 378   < > = values in this interval not displayed.   Cardiac Enzymes: No results for input(s): "CKTOTAL", "CKMB", "CKMBINDEX", "TROPONINI" in the last 168 hours. CBG: Recent Labs  Lab 01/20/23 1543 01/20/23 1901 01/20/23 2331 01/21/23 0258 01/21/23 0734  GLUCAP  225* 294* 210* 156* 126*    Iron Studies: No results for input(s): "IRON", "TIBC", "TRANSFERRIN", "FERRITIN" in the last 72 hours. Studies/Results: No results found.  arformoterol  15 mcg Nebulization BID   aspirin  81 mg Per Tube Daily   budesonide (PULMICORT) nebulizer solution  0.5 mg Nebulization BID   busPIRone  5 mg Per Tube TID   Chlorhexidine Gluconate Cloth  6 each Topical Daily   Chlorhexidine Gluconate Cloth  6 each Topical Q0600   clonazepam  1 mg Per Tube BID   docusate  100 mg Per Tube BID   feeding supplement  237 mL Oral BID BM   fluticasone  2 spray Each Nare Daily   heparin  5,000 Units Subcutaneous Q8H   insulin aspart  0-20 Units Subcutaneous Q4H   insulin glargine-yfgn  10 Units Subcutaneous BID   lidocaine  1 patch Transdermal Q24H   loratadine  10 mg Per Tube Q48H   [START ON 01/22/2023] methylPREDNISolone (SOLU-MEDROL) injection  60 mg Intravenous Daily   Followed by   Melene Muller ON 01/25/2023] methylPREDNISolone (SOLU-MEDROL) injection  50 mg Intravenous Daily   Followed by   Melene Muller ON 01/28/2023]  predniSONE  40 mg Per Tube Q breakfast   Followed by   Melene Muller ON 01/31/2023] predniSONE  30 mg Per Tube Q breakfast   Followed by   Melene Muller ON 02/03/2023] predniSONE  20 mg Per Tube Q breakfast   Followed by   Melene Muller ON 02/06/2023] predniSONE  10 mg Per Tube Q breakfast   montelukast  10 mg Per Tube QHS   multivitamin  1 tablet Per Tube QHS   mouth rinse  15 mL Mouth Rinse 4 times per day   pantoprazole (PROTONIX) IV  40 mg Intravenous QHS   polyethylene glycol  17 g Per Tube Daily   pramipexole  0.5 mg Per Tube QHS   pravastatin  20 mg Per Tube QPM   revefenacin  175 mcg Nebulization Daily   saccharomyces boulardii  250 mg Per Tube BID   senna  2 tablet Per Tube QHS   traZODone  50 mg Per Tube QHS    BMET    Component Value Date/Time   NA 133 (L) 01/21/2023 0238   K 4.2 01/21/2023 0238   CL 97 (L) 01/21/2023 0238   CO2 27 01/21/2023 0238   GLUCOSE 149 (H)  01/21/2023 0238   BUN 67 (H) 01/21/2023 0238   CREATININE 2.26 (H) 01/21/2023 0238   CALCIUM 7.3 (L) 01/21/2023 0238   GFRNONAA 29 (L) 01/21/2023 0238   GFRAA >60 12/15/2019 0812   CBC    Component Value Date/Time   WBC 9.6 01/21/2023 0238   RBC 2.55 (L) 01/21/2023 0238   HGB 7.4 (L) 01/21/2023 0238   HCT 22.9 (L) 01/21/2023 0238   PLT 378 01/21/2023 0238   MCV 89.8 01/21/2023 0238   MCH 29.0 01/21/2023 0238   MCHC 32.3 01/21/2023 0238   RDW 16.0 (H) 01/21/2023 0238   LYMPHSABS 0.5 (L) 01/12/2023 0047   MONOABS 0.5 01/12/2023 0047   EOSABS 0.0 01/12/2023 0047   BASOSABS 0.0 01/12/2023 0047    Assessment/Plan:  AKI - presumably ischemic ATN in setting of sepsis due to pneumonia and poor po intake. Vanco trough was high at 28.  UA with blood and protein but many bacteria and WBC too, low susp for pulm renal and serologies neg ANCA and antiGBM. BUN/Cr continued to climb.  CRRT initiated after right internal jugular catheter placed by PCCM 7/6 which continued until 7/8 PM.  In setting of worsening azotemia did HD 7/13 for clearance, no UF needed UOP improving, watching daily labs for now, may not need further dialysis If cont to need HD will need tunneled HD cath placed OK with redosing lasix as clinically indicated.  Volume looks ok for now.  Avoid nephrotoxic medications including NSAIDs and iodinated intravenous contrast exposure unless the latter is absolutely indicated.  Preferred narcotic agents for pain control are hydromorphone, fentanyl, and methadone. Morphine should not be used. Avoid Baclofen and avoid oral sodium phosphate and magnesium citrate based laxatives / bowel preps. Continue strict Input and Output monitoring. Will monitor the patient closely with you and intervene or adjust therapy as indicated by changes in clinical status/labs   AMS - delerium off/on.  Currently ok.  Sepsis - due to recurrent multifocal pneumonia - suspected aspiration.  Improved Acute hypoxic  respiratory failure - required intubation now extubated.  Improving. Iron deficiency anemia - continue to follow, Hb 7, defer transfusion to primary.  Severe protein malnutrition - alb 1.5.  cortrak and supplements per PCCM. Esophageal dysmotility - speech path following.   Estill Bakes MD  [facility] Pager [phone]

## 2023-01-21 NOTE — Progress Notes (Addendum)
Updated family about Travis Beck's status  Overall stable  Family desires that he be evaluated by GI for possible options of care regarding his dysphagia  He has a history of esophageal dysmotility Esophageal stricture for which he had dilatation in the past  Did see a GI doctor in [place]  They are open to options of care including esophageal dilatation and if unable to eat, options regarding PEG tube placement    Consulted GI   Family also feels that discussions with Travis Beck regarding interventions is getting him more stressed and very anxious, they want to be involved with conversations regarding interventions

## 2023-01-21 NOTE — Progress Notes (Signed)
MD made aware of trending BP. New orders in   

## 2023-01-21 NOTE — Progress Notes (Signed)
Patient ID: Reeder Proa, male   DOB: [birth year], 77 y.o.   MRN: [id number]    Progress Note from the Palliative Medicine Team at [facility]   Patient Name: Travis Beck        Date: 01/21/2023 DOB: 20-Mar-1946  Age: 77 y.o. MRN#: [id number] Attending Physician: Tomma Lightning, MD Primary Care Physician: Kirstie Peri, MD Admit Date: 01/06/2023    Extensive chart review has been completed prior to meeting with patient/family  including labs, vital signs, imaging, progress/consult notes, orders, medications and available advance directive documents.   77 y.o. male   admitted on 01/06/2023 with past   medical history significant of IIDM, HTN, SIDAH, COPD,  former smoker quit 1994 with a 66 pack year smoking history, diabetic neuropathy, recurrent pneumonia presented 01/06/2023 with worsening nausea /, cough and shortness of breath.    Patient was recently hospitalized 5/23-5/26 for multifocal MRSA pneumonia  and septic shock requiring vasopressor on admission-stabilized and subsequently discharged home on Augmentin/doxycycline .    Pt returned to [facility] 12/07/2022 with fever of 103.1 and ongoing left sided pleuritic pain. He has a small effusion that was not big enough to tap.  He was admitted for incompletely treated PNA-as sputum cultures finalized postdischarge, and Doxycycline did not cover MRSA. He was treated with Zyvox, and discharged home on Zyvox p.o on 12/13/2022.    Initially his breathing symptoms improved with Zyvox however patient continued to experience frequent nausea, vomiting and cough. He presented to the ED 6/29 as these symptoms were worsening.   01/06/23 swallow evaluation significant for esophageal dysphagia  01/08/23 worsening chest x-ray, significant for extensive right lung, mild left mid lung and left lower lobe interstitial airspace opacities.  Required intubation  01/10/2023-successfully  extubated  01/11/2023-increasing oxygen needs now on  100% O2, increased agitation, worsening renal function/creatinine 5.28  01/12/2023 CRRT started, pressors  01/19/23   Patient remains with core track for nutritional support, he is high risk for aspiration secondary to dysphagia.  Patient is high risk for decompensation overall.  This NP assessed patient at the bedside as a follow up  for palliative medicine needs and emotional support.   Patient is alert and communicative.   No family at bedside. He is actually moving himself about the bed very well.       Patient asks me today if  "Can I  get this tube out of my nose"   He is referring to Cor- track.  Education and discussion around his h/o and ongoing issues with dysphagia, specific to aspiration pneumonia.   Education offered on PEG tubes, risks and benefits. He states well " its all up to the good Lord".   He speaks to hope for improvement and time with his 2 yo grandson.   Continued education regarding the patient's current medical situation and is high risk for decompensation secondary to his multiple co-morbidities; dysphagia, COPD, recurrent aspiration pneumonia, worsening renal function,  weakness and overall failure to thrive.   Questions and concerns addressed.     Plan of Care: -Full code     -Educated family to consider DNR/DNI status understanding evidenced based poor outcomes in similar hospitalized patient, as the cause of arrest is likely associated with advanced chronic illness rather than an easily reversible acute cardio-pulmonary event. - Patient/Family are open to all offered and available medical interventions to prolong life. -Ongoing conversation regarding goals of care  Education offered today regarding  the importance of continued conversation with  family members  and the  medical providers regarding overall plan of care and treatment options,  ensuring decisions are within the context of the patients values and GOCs.   Patient and family will need ongoing  support as they navigate healthcare decisions.  Questions and concerns addressed   Discussed with  bedside RN  PMT will continue to support holistically  Time: 55  minutes    Lorinda Creed NP  Palliative Medicine Team Team Phone # [phone] Pager [phone]

## 2023-01-21 NOTE — Consult Note (Addendum)
Consultation  Referring Provider: CCM/ Olalere Primary Care Physician:  Kirstie Peri, MD Primary Gastroenterologist:  Dr.Cataneda/ Sidney Ace  Reason for Consultation: Dysphagia  HPI: Travis Beck is a 77 y.o. male, who was admitted 16 days ago . He has history of insulin-dependent diabetes mellitus, hypertension, SIADH, asthma diabetic neuropathy and recurrent pneumonias. He had been admitted in late May for multifocal MRSA pneumonia and septic shock, required vasopressors. Return to [facility] 12/07/2022 with fever of 103, and left effusion,, and treated for incompletely treated pneumonia. He returned back to the emergency room on 629 with symptoms of intermittent cough, nausea vomiting and temp of 99.6 and low O2 sats on 3 L.  Found again to have a multifocal pneumonia on chest x-ray. He required intubation on 01/08/2023, extubated 2 days later.  Required CRRT on 01/12/2023 and was able to get off pressors. Had to return to the ICU on 01/18/2023 after desaturation, he is requiring as needed BiPAP, He had developed acute kidney injury with uremia and is now requiring dialysis.  He had speech path eval on 01/07/2023 with modified swallowing study, had normal oral phase of swallow and pharyngeal phase, comment was that his dysphagia appears to be primarily esophageal that he did have some delay of initiation of swallow with thin liquids, and a trace amount of thin liquid barium entered laryngeal vestibule but remained above the vocal cords, no significant pharyngeal residuals 13 mm barium tablet passed through PES without difficulty over there was barium stasis including the tablet served in the distal esophagus  On review of chart patient had EGD April 2022 per Dr. Levon Hedger for complaints of dysphagia and heartburn. There was no endoscopic abnormality evident in the esophagus to explain his complaints of dysphagia, distal was made to proceed with empiric dilation of the entire  esophagus and he had Savary dilation to 18 mm, there was suspicion for short segment Barrett's.Bx negative for intestinal metaplasia.  Patient is improving from pneumonia perspective, is currently taking some liquids and receiving enteral tube feeds.  Last chest x-ray 01/17/2023 severe diffuse bilateral airspace disease unchanged  Family at bedside state that earlier in the year he had not been complaining of any ongoing or intermittent issues with dysphagia or regurgitation.  They think that most of the problems with vomiting started occurring around the time he was taking oral antibiotics for the pneumonia. They were not aware of the details of the results of the prior endoscopy Patient is not alert enough currently to tell me whether or not he had improvement in his symptoms after the empiric dilation.   Past Medical History:  Diagnosis Date   Arthritis    Asthma    Childhood   Emphysema lung (HCC)    Essential hypertension    Ischemic heart disease    Myoview 2020 indicating inferior infarct scar with mild peri-infarct ischemia - managed medically   Type 2 diabetes mellitus (HCC)     Past Surgical History:  Procedure Laterality Date   BIOPSY  08/30/2018   Procedure: BIOPSY;  Surgeon: Malissa Hippo, MD;  [facility];  Service: Endoscopy;;  gastric    BIOPSY  11/05/2020   Procedure: BIOPSY;  Surgeon: Dolores Frame, MD;  [facility];  Service: Gastroenterology;;   COLONOSCOPY     COLONOSCOPY WITH PROPOFOL N/A 11/05/2020   Procedure: COLONOSCOPY WITH PROPOFOL;  Surgeon: Dolores Frame, MD;  [facility];  Service: Gastroenterology;  Laterality: N/A;  Am  ESOPHAGEAL DILATION N/A 05/21/2019   Procedure: ESOPHAGEAL DILATION;  Surgeon: Malissa Hippo, MD;  [facility];  Service: Endoscopy;  Laterality: N/A;   ESOPHAGOGASTRODUODENOSCOPY N/A 05/21/2019   Procedure: ESOPHAGOGASTRODUODENOSCOPY (EGD);  Surgeon: Malissa Hippo, MD;  [facility];  Service: Endoscopy;  Laterality: N/A;  730   ESOPHAGOGASTRODUODENOSCOPY (EGD) WITH PROPOFOL N/A 08/30/2018   Procedure: ESOPHAGOGASTRODUODENOSCOPY (EGD) WITH PROPOFOL;  Surgeon: Malissa Hippo, MD;  [facility];  Service: Endoscopy;  Laterality: N/A;  1:30   ESOPHAGOGASTRODUODENOSCOPY (EGD) WITH PROPOFOL N/A 11/05/2020   Procedure: ESOPHAGOGASTRODUODENOSCOPY (EGD) WITH PROPOFOL;  Surgeon: Dolores Frame, MD;  [facility];  Service: Gastroenterology;  Laterality: N/A;   HERNIA REPAIR     bilateral lower abdomen   POLYPECTOMY  11/05/2020   Procedure: POLYPECTOMY;  Surgeon: Dolores Frame, MD;  [facility];  Service: Gastroenterology;;   Gaspar Bidding DILATION  11/05/2020   Procedure: Gaspar Bidding DILATION;  Surgeon: Marguerita Merles, Reuel Boom, MD;  [facility];  Service: Gastroenterology;;    Prior to Admission medications   Medication Sig Start Date End Date Taking? Authorizing Provider  acetaminophen (TYLENOL) 325 MG tablet Take 2 tablets (650 mg total) by mouth every 6 (six) hours as needed for mild pain (or Fever >/= 101). 11/26/19  Yes Emokpae, Courage, MD  albuterol (VENTOLIN HFA) 108 (90 Base) MCG/ACT inhaler Inhale 2 puffs into the lungs every 4 (four) hours as needed for wheezing or shortness of breath. Patient taking differently: Inhale 1 puff into the lungs every 4 (four) hours as needed for wheezing or shortness of breath. 11/26/19  Yes Emokpae, Courage, MD  ALPRAZolam Prudy Feeler) 1 MG tablet Take 1 tablet (1 mg total) by mouth 3 (three) times daily as needed for anxiety. Patient taking differently: Take 1-2 mg by mouth at bedtime as needed for anxiety or sleep. 01/28/19  Yes Welborn, Ryan, DO  amLODipine (NORVASC) 10 MG tablet Take 10 mg by mouth at bedtime.   Yes [provider]  Budeson-Glycopyrrol-Formoterol (BREZTRI AEROSPHERE) 160-9-4.8 MCG/ACT AERO Inhale 2 puffs into the lungs in  the morning and at bedtime. 12/13/22  Yes Leroy Sea, MD  buprenorphine (SUBUTEX) 8 MG SUBL SL tablet Place 4 mg under the tongue as needed (pain). 01/04/19  Yes [provider]  cetirizine (ZYRTEC) 10 MG tablet Take 10 mg by mouth at bedtime.   Yes [provider]  famotidine (PEPCID) 20 MG tablet Take 1 tablet (20 mg total) by mouth 2 (two) times daily for 7 days. Take one tablet twice daily for two days Patient taking differently: Take 20 mg by mouth as needed for heartburn or indigestion. 12/30/22 01/06/23 Yes Gerhard Munch, MD  fluticasone [facility]) 50 MCG/ACT nasal spray Place 1 spray into both nostrils daily. 08/22/18  Yes [provider]  gabapentin (NEURONTIN) 400 MG capsule Take 1 capsule (400 mg total) by mouth every 8 (eight) hours as needed. Patient taking differently: Take 400-800 mg by mouth as needed (pain). 01/28/19  Yes Welborn, Ryan, DO  glipiZIDE (GLUCOTROL XL) 10 MG 24 hr tablet Take 10 mg by mouth daily. 11/06/22  Yes [provider]  guaiFENesin (MUCINEX) 600 MG 12 hr tablet Take 1 tablet (600 mg total) by mouth 2 (two) times daily as needed for cough. Patient taking differently: Take 600 mg by mouth 2 (two) times daily as needed for cough or to loosen phlegm. 12/13/22  Yes Leroy Sea, MD  metFORMIN (GLUCOPHAGE) 1000  MG tablet Take 1,000 mg by mouth 2 (two) times daily with a meal.  12/07/14  Yes [provider]  montelukast (SINGULAIR) 10 MG tablet Take 1 tablet (10 mg total) by mouth at bedtime. 06/15/22  Yes Mannam, Praveen, MD  ondansetron (ZOFRAN-ODT) 4 MG disintegrating tablet Take 1 tablet (4 mg total) by mouth every 8 (eight) hours as needed for nausea or vomiting. Consider taking 30 minutes prior to meals. Patient taking differently: Take 4 mg by mouth as needed for nausea or vomiting. 12/30/22  Yes Gerhard Munch, MD  pantoprazole (PROTONIX) 40 MG tablet Take 1 tablet (40 mg total) by mouth daily. 12/15/20  Yes Dolores Frame, MD  pramipexole (MIRAPEX) 0.5 MG tablet Take 0.5 mg by mouth at bedtime. 11/04/22  Yes [provider]  pravastatin (PRAVACHOL) 20 MG tablet Take 20 mg by mouth every evening.   Yes [provider]  sucralfate (CARAFATE) 1 g tablet Take 1 tablet (1 g total) by mouth 4 (four) times daily -  with meals and at bedtime. 12/30/22  Yes Gerhard Munch, MD  valsartan (DIOVAN) 160 MG tablet Take 160 mg by mouth daily. 11/06/22  Yes [provider]    Current Facility-Administered Medications  Medication Dose Route Frequency Provider Last Rate Last Admin   0.9 %  sodium chloride infusion  250 mL Intravenous Continuous Olalere, Adewale A, MD   Stopped at 01/11/23 0820   acetaminophen (TYLENOL) tablet 650 mg  650 mg Per Tube Q6H PRN Karie Fetch P, DO   650 mg at 01/20/23 1946   albuterol (PROVENTIL) (2.5 MG/3ML) 0.083% nebulizer solution 2.5 mg  2.5 mg Nebulization Q4H PRN Melody Comas B, MD   2.5 mg at 01/18/23 1056   ALPRAZolam (XANAX) tablet 0.5 mg  0.5 mg Per Tube Q6H PRN Coralyn Helling, MD   0.5 mg at 01/21/23 1026   alteplase (CATHFLO ACTIVASE) injection 2 mg  2 mg Intracatheter Once PRN Tyler Pita, MD       anticoagulant sodium citrate solution 5 mL  5 mL Intracatheter PRN Tyler Pita, MD       arformoterol [facility]) nebulizer solution 15 mcg  15 mcg Nebulization BID Marguerita Merles [place], DO   15 mcg at 01/21/23 0809   aspirin chewable tablet 81 mg  81 mg Per Tube Daily Steffanie Dunn, DO   81 mg at 01/21/23 0951   budesonide (PULMICORT) nebulizer solution 0.5 mg  0.5 mg Nebulization BID Sheikh, Omair Latif, DO   0.5 mg at 01/21/23 0809   buprenorphine (SUBUTEX) SL tablet 2 mg  2 mg Sublingual BID PRN Coralyn Helling, MD   0.5 mg at 01/15/23 0115   busPIRone (BUSPAR) tablet 5 mg  5 mg Per Tube TID Olalere, Adewale A, MD   5 mg at 01/21/23 0951   Chlorhexidine Gluconate Cloth 2 % PADS 6 each  6 each Topical Daily Steffanie Dunn, DO   6 each at  01/20/23 1802   Chlorhexidine Gluconate Cloth 2 % PADS 6 each  6 each Topical Q0600 Tyler Pita, MD       clonazePAM Scarlette Calico) disintegrating tablet 1 mg  1 mg Per Tube BID Calton Dach I, RPH   1 mg at 01/21/23 0951   docusate (COLACE) 50 MG/5ML liquid 100 mg  100 mg Per Tube BID Bell, Lorin C, RPH   100 mg at 01/21/23 1005   feeding supplement (ENSURE ENLIVE / ENSURE PLUS) liquid 237 mL  237 mL  Oral BID BM Karie Fetch P, DO   237 mL at 01/21/23 1437   feeding supplement (GLUCERNA 1.5 CAL) liquid 1,000 mL  1,000 mL Per Tube Continuous Cheri Fowler, MD 50 mL/hr at 01/21/23 1300 Infusion Verify at 01/21/23 1300   fluticasone (FLONASE) 50 MCG/ACT nasal spray 2 spray  2 spray Each Nare Daily Emeline General, MD   2 spray at 01/21/23 0956   gabapentin (NEURONTIN) 250 MG/5ML solution 100 mg  100 mg Per Tube Q8H PRN Steffanie Dunn, DO   100 mg at 01/15/23 2101   heparin injection 1,000 Units  1,000 Units Intracatheter PRN Tyler Pita, MD       heparin injection 5,000 Units  5,000 Units Subcutaneous Q8H Modena Slater, DO   5,000 Units at 01/21/23 1429   insulin aspart (novoLOG) injection 0-20 Units  0-20 Units Subcutaneous Q4H Modena Slater, DO   3 Units at 01/21/23 1137   insulin glargine-yfgn (SEMGLEE) injection 10 Units  10 Units Subcutaneous BID Modena Slater, DO   10 Units at 01/21/23 0952   lidocaine (LIDODERM) 5 % 1 patch  1 patch Transdermal Q24H Carilyn Goodpasture, MD   1 patch at 01/19/23 2029   lidocaine (PF) (XYLOCAINE) 1 % injection 5 mL  5 mL Intradermal PRN Tyler Pita, MD       lidocaine-prilocaine (EMLA) cream 1 Application  1 Application Topical PRN Tyler Pita, MD       loratadine (CLARITIN) tablet 10 mg  10 mg Per Tube Q48H Coralyn Helling, MD   10 mg at 01/20/23 0925   [START ON 01/22/2023] methylPREDNISolone sodium succinate (SOLU-MEDROL) 125 mg/2 mL injection 60 mg  60 mg Intravenous Daily Cheri Fowler, MD       Followed by   Melene Muller ON 01/25/2023] methylPREDNISolone  sodium succinate (SOLU-MEDROL) 125 mg/2 mL injection 50 mg  50 mg Intravenous Daily Cheri Fowler, MD       Followed by   Melene Muller ON 01/28/2023] predniSONE (DELTASONE) tablet 40 mg  40 mg Per Tube Q breakfast Cheri Fowler, MD       Followed by   Melene Muller ON 01/31/2023] predniSONE (DELTASONE) tablet 30 mg  30 mg Per Tube Q breakfast Cheri Fowler, MD       Followed by   Melene Muller ON 02/03/2023] predniSONE (DELTASONE) tablet 20 mg  20 mg Per Tube Q breakfast Cheri Fowler, MD       Followed by   Melene Muller ON 02/06/2023] predniSONE (DELTASONE) tablet 10 mg  10 mg Per Tube Q breakfast Merrily Pew, Garnet Sierras, MD       montelukast (SINGULAIR) tablet 10 mg  10 mg Per Tube QHS Karie Fetch P, DO   10 mg at 01/20/23 2102   multivitamin (RENA-VIT) tablet 1 tablet  1 tablet Per Tube QHS Coralyn Helling, MD   1 tablet at 01/20/23 2101   Oral care mouth rinse  15 mL Mouth Rinse 4 times per day Coralyn Helling, MD   15 mL at 01/21/23 1200   Oral care mouth rinse  15 mL Mouth Rinse PRN Coralyn Helling, MD       pantoprazole (PROTONIX) injection 40 mg  40 mg Intravenous QHS Coralyn Helling, MD   40 mg at 01/20/23 2101   pentafluoroprop-tetrafluoroeth (GEBAUERS) aerosol 1 Application  1 Application Topical PRN Tyler Pita, MD       phenol (CHLORASEPTIC) mouth spray 1 spray  1 spray Mouth/Throat PRN Conrad Plains, MD   1 spray at 01/19/23 1028  polyethylene glycol (MIRALAX / GLYCOLAX) packet 17 g  17 g Per Tube Daily Calton Dach I, RPH   17 g at 01/21/23 0951   pramipexole (MIRAPEX) tablet 0.5 mg  0.5 mg Per Tube QHS Olalere, Adewale A, MD   0.5 mg at 01/20/23 2102   pravastatin (PRAVACHOL) tablet 20 mg  20 mg Per Tube QPM Karie Fetch P, DO   20 mg at 01/20/23 1722   revefenacin (YUPELRI) nebulizer solution 175 mcg  175 mcg Nebulization Daily Martina Sinner, MD   175 mcg at 01/21/23 0809   saccharomyces boulardii (FLORASTOR) capsule 250 mg  250 mg Per Tube BID Steffanie Dunn, DO   250 mg at 01/21/23 1610   senna (SENOKOT)  tablet 17.2 mg  2 tablet Per Tube QHS Calton Dach I, RPH   17.2 mg at 01/20/23 2102   traZODone (DESYREL) tablet 50 mg  50 mg Per Tube QHS Bell, Lorin C, RPH   50 mg at 01/20/23 2102    Allergies as of 01/06/2023 - Review Complete 01/06/2023  Allergen Reaction Noted   Penicillins Rash 12/14/2014    History reviewed. No pertinent family history.  Social History   Socioeconomic History   Marital status: Widowed    Spouse name: Not on file   Number of children: Not on file   Years of education: Not on file   Highest education level: Not on file  Occupational History   Not on file  Tobacco Use   Smoking status: Former    Current packs/day: 0.00    Average packs/day: 2.0 packs/day for 33.0 years (66.0 ttl pk-yrs)    Types: Cigarettes    Start date: 01/23/1960    Quit date: 01/22/1993    Years since quitting: 30.0   Smokeless tobacco: Never  Vaping Use   Vaping status: Never Used  Substance and Sexual Activity   Alcohol use: Not Currently   Drug use: Never   Sexual activity: Not on file  Other Topics Concern   Not on file  Social History Narrative   Not on file   Social Determinants of Health   Financial Resource Strain: Low Risk  (09/27/2022)   Overall Financial Resource Strain (CARDIA)    Difficulty of Paying Living Expenses: Not very hard  Food Insecurity: No Food Insecurity (12/08/2022)   Hunger Vital Sign    Worried About Running Out of Food in the Last Year: Never true    Ran Out of Food in the Last Year: Never true  Transportation Needs: No Transportation Needs (12/08/2022)   PRAPARE - Administrator, Civil Service (Medical): No    Lack of Transportation (Non-Medical): No  Physical Activity: Inactive (07/31/2022)   Exercise Vital Sign    Days of Exercise per Week: 0 days    Minutes of Exercise per Session: 0 min  Stress: Not on file  Social Connections: Not on file  Intimate Partner Violence: Not At Risk (12/08/2022)   Humiliation, Afraid,  Rape, and Kick questionnaire    Fear of Current or Ex-Partner: No    Emotionally Abused: No    Physically Abused: No    Sexually Abused: No    Review of Systems: Pertinent positive and negative review of systems were noted in the above HPI section.  All other review of systems was otherwise negative.   Physical Exam: Vital signs in last 24 hours: Temp:  [97 F (36.1 C)-98.7 F (37.1 C)] 97 F (36.1 C) (07/14 1112) Pulse Rate:  [  61-93] 87 (07/14 1300) Resp:  [14-31] 23 (07/14 1300) BP: (110-145)/(54-105) 137/105 (07/14 1300) SpO2:  [88 %-100 %] 97 % (07/14 1300) Weight:  [70.2 kg] 70.2 kg (07/14 0300) Last BM Date : 01/20/23 General:   Alert,  Well-developed, frail-appearing elderly white male cooperative in NAD, semisedated after Xanax, trying to answer a few questions, enteral tube in nares Head:  Normocephalic and atraumatic. Eyes:  Sclera clear, no icterus.   Conjunctiva pink. Ears:  Normal auditory acuity. Nose:  No deformity, discharge,  or lesions. Mouth:  No deformity or lesions.   Neck:  Supple; no masses or thyromegaly. Lungs:  Clear throughout to auscultation.  Few scattered rhonchi   CV; Regular rate and rhythm; no murmurs, clicks, rubs,  or gallops. Abdomen:  Soft,nontender, BS active,nonpalp mass or hsm.   Rectal:  not done Msk:  Symmetrical without gross deformities. . Pulses:  Normal pulses noted. Extremities:  Without clubbing or edema. Neurologic:  Alert and  oriented x4;  grossly normal neurologically. Skin:  Intact without significant lesions or rashes.. Psych: Semisedated post Xanax trying to answer a few questions  Intake/Output from previous day: 07/13 0701 - 07/14 0700 In: 1955 [P.O.:655; NG/GT:1300] Out: 1350 [Urine:1350] Intake/Output this shift: Total I/O In: 350 [NG/GT:350] Out: 350 [Urine:350]  Lab Results: Recent Labs    01/19/23 2034 01/20/23 0732 01/21/23 0238  WBC 12.9* 13.7* 9.6  HGB 7.3* 8.2* 7.4*  HCT 23.1* 25.4* 22.9*  PLT  424* 428* 378   BMET Recent Labs    01/19/23 2034 01/20/23 0732 01/21/23 0238  NA 131* 132* 133*  K 5.2* 4.0 4.2  CL 95* 93* 97*  CO2 23 26 27   GLUCOSE 207* 191* 149*  BUN 105* 51* 67*  CREATININE 3.06* 1.89* 2.26*  CALCIUM 7.4* 7.4* 7.3*   LFT Recent Labs    01/21/23 0238  ALBUMIN 1.7*   PT/INR No results for input(s): "LABPROT", "INR" in the last 72 hours. Hepatitis Panel Recent Labs    01/19/23 1022  HEPBSAG NON REACTIVE     IMPRESSION:  #65 77 year old white male, with prolonged illness over the past couple of months recurrent multifocal pneumonia, currently hospitalized over the past 16 days, required intubation x 48 hours, extubated 10 days ago, back to ICU 01/18/2023 for desaturations Improving from a respiratory standpoint currently using BiPAP as needed  #2 concern for aspiration secondary to dysphagia.  Patient had been having episodes of nausea and vomiting with coughing prior to admission  He did have speech path eval about 2 weeks ago with good oropharyngeal function and no evidence of aspiration, did have esophageal stasis noted and concerns for underlying esophageal issue.  Patient had EGD 2022 in [place] for complaints of dysphagia and GERD, had a normal-appearing esophagus, no stricture, and had empiric dilation of the entire esophagus Savary to 18 mm. Patient unable to say at this time whether or not he had any improvement in symptoms from empiric dilation and family does not recall him having complaints of dysphagia earlier in the year, they feel most of these symptoms started over the past couple of months since he has been sick with pneumonia and on oral antibiotics as an outpatient.  Patient may have an underlying esophageal motility disorder i.e. presbyesophagus, rule out achalasia. Alternatively he may have been having some dysphagia and episodes of nausea and vomiting due to antibiotics and illness with pneumonia  #3 acute metabolic  encephalopathy improving #4 malnutrition-continue tube feedings for now, PEG tube has been brought  up which family says patient is not in favor of  #5 iron deficiency anemia #6.  Insulin-dependent diabetes mellitus #7 significant debilitation secondary to prolonged illness #8 acute kidney injury currently requiring dialysis   PLAN: Will schedule for barium swallow tomorrow Patient is a poor endoscopic candidate, high risk for respiratory decompensation with sedation and will hope to avoid  Continue nasoenteric tube feeds and current dysphagia 2 diet  GI will follow-up tomorrow ,after barium swallow   Amy Esterwood PA-C 01/21/2023, 3:00 PM     Attending physician's note   I have taken history, reviewed the chart and examined the patient. I performed a substantive portion of this encounter, including complete performance of at least one of the key components, in conjunction with the APP. I agree with the Advanced Practitioner's note, impression and recommendations.   Recurrent pneumonia (?aspiration) with resp failure, extubated over 10 days ago, currently on BiPAP  GI consulted for possible aspiration d/t eso dysphagia. Speech eval with good oropharyngeal function without aspiration.  Esophageal stasis was noted. Prev EGD 2022 was neg for etiology of dysphagia or neg for stricture, s/p empiric dil to 18 mm savory. CT chest shows small hiatal hernia.   Malnutrition-on cortrak feeds.  He wants cortrak tube to be taken out.  Family refusing PEG.  Plan: -Reasonable to proceed with Ba swallow (also give barium tablet) in AM.  -If neg, hold off on EGD. -If +, then would consider further evaluation.  He remains at a higher risk with any invasive procedures. -Continue Protonix 40 daily -Aspiration precautions.  Dr Lavon Paganini taking over GI service tomorrow  Edman Circle, MD [facility] [phone]

## 2023-01-21 NOTE — Progress Notes (Addendum)
NAME:  Travis Beck, MRN:  [id number], DOB:  [birth year], LOS: 15 ADMISSION DATE:  01/06/2023, CONSULTATION DATE:  01/08/2023 REFERRING MD:  Marland Mcalpine, CHIEF COMPLAINT:  Aspiration Pneumonia, nausea and vomiting   History of Present Illness:  77 y.o. male with medical history significant of IIDM, HTN, SIDAH, Asthma, (Former smoker quit 1994 with a 66 pack year smoking history diabetic neuropathy, recurrent pneumonia presented 01/06/2023 with worsening of nauseous vomiting cough and shortness of breath.  Patient was recently hospitalized 5/23-5/26 for multifocal MRSA pneumonia  and septic shock requiring vasopressor on admission-stabilized and subsequently discharged home on Augmentin/doxycycline . Pt returned to [facility] 12/07/2022 with fever of 103.1 and ongoing left sided pleuritic pain. He has a small effusion that was not big enough to tap.  He was admitted for incompletely treated PNA-as sputum cultures finalized postdischarge, and Doxycycline did not cover MRSA. He was treated with Zyvox, and discharged home on Zyvox p.o on 12/13/2022. Initially his breathing symptoms improved with Zyvox however patient continued to experience frequent nausea, vomiting and cough. He presented to the ED 6/29 as these symptoms were worsening.  In the ED he was mildly febrile at 99.6, He had  tachycardia, oxygen sats were 93% on 3 L. CXR again showed a multifocal pneumonia, right sided. Na 128/ Creatinine of 4.2, BUN 66 and WBC of 20. His HGB was 8.7.Respiratory Panel was negative. Lactic Acid trend since admission 2.7>>2.8>>1,3 Pulmonary was  asked to consult for recurrent vs slow to resolve pneumonia and hypoxemia . Of note , patient is followed in the [facility]  Pulmonary clinic by Dr. Isaiah Serge and Rubye Oaks NP.  Per family patient has had about a 15 pound weight loss since the end of May 24 when pneumonia was initially diagnosed.  Pertinent  Medical History  Arthritis, Asthma, HTN, DM type  2  Significant Hospital Events: Including procedures, antibiotic start and stop dates in addition to other pertinent events   5/23-5/26 hospitalization for septic shock from multilobar PNA 5/30 -6/5 fever 103 F at home-ongoing left-sided pleuritic chest pain 6/29 Recurrent pneumonitis 7/01 intubated 7/03 extubated 7/04 complete ABx 7/05 start CRRT, off pressors 7/06 start steroids 7/11 blood back to the ICU for desaturations 7/13-had dialysis  Interim History / Subjective:  Oxygen requirement stable Denies significant hemoptysis at present Had dialysis 7/13 Denies any pain or discomfort, overall feels fair  Objective   BP (!) 144/83   Pulse 87   Temp (!) 97 F (36.1 C) (Axillary)   Resp (!) 25   Ht 5\' 5"  (1.651 m)   Wt 70.2 kg   SpO2 90%   BMI 25.75 kg/m .   Examination: General -chronically ill-appearing HEENT-dry oral mucosa Cardiac -S1-S2 appreciated Chest -bibasal rales abdomen -abdomen is soft, bowel sounds appreciated Extremities -no clubbing, no edema Skin: Skin is warm and dry Neuro - awake, alert, answering questions appropraitely   X-rays reviewed and stable from previous  Labs reviewed BUN 67, creatinine 2.26 Albumin 1.7 H&H 7.4/23  Continues to have good urine output  Resolved Hospital Problem list     Assessment & Plan:   Acute hypoxemic respiratory failure with multifocal pneumonitis related to aspiration Possible  TRALI after transfusion on 7/10 -Continue BiPAP as needed -Tapering doses of steroids -Continue bronchodilators  Acute kidney injury Uremic -Tolerated dialysis well on 7/13 -He has good urine output -Continue to monitoring  Acute metabolic encephalopathy secondary to hypoxia and renal failure Chronic pain Depression/anxiety -On clonazepam -BuSpar was added  History  of asthma -Continue current inhalers  Esophageal dysmotility -Ongoing risk of aspiration -Continue PPI  Protein calorie malnutrition -Continue tube  feeds  Iron deficiency anemia -Continue to monitor  Type 2 diabetes -Continue glargine -Continue SSI  Review ongoing goals of care discussions  History of hyperlipidemia -On pravastatin  If patient remains stable, may be able to [facility]  Family was updated 10/12 I did reassure them that we are doing everything we can to have him stabilize They had questions about a bronchoscopy which I do not believe will be a good idea in somebody who is making significant strides.  Multifactorial reasons for the hemoptysis, it is small, will continue to monitor H&H has remained stable -A bronchoscopy will be placing him at significant risk of ending up on the ventilator and not able to wean  Best Practice (right click and "Reselect all SmartList Selections" daily)   Diet/type: tube feeds DVT prophylaxis: SQ heparin GI prophylaxis: protonix Lines: Rt internal jugular HD catheter Foley:  Yes, still needed Code Status:  full code Last date of multidisciplinary goals of care discussion (updated family at bedside 7/11)  Labs       Latest Ref Rng & Units 01/21/2023    2:38 AM 01/20/2023    7:32 AM 01/19/2023    8:34 PM  CMP  Glucose 70 - 99 mg/dL 161  096  045   BUN 8 - 23 mg/dL 67  51  409   Creatinine 0.61 - 1.24 mg/dL 8.11  9.14  7.82   Sodium 135 - 145 mmol/L 133  132  131   Potassium 3.5 - 5.1 mmol/L 4.2  4.0  5.2   Chloride 98 - 111 mmol/L 97  93  95   CO2 22 - 32 mmol/L 27  26  23    Calcium 8.9 - 10.3 mg/dL 7.3  7.4  7.4       Latest Ref Rng & Units 01/21/2023    2:38 AM 01/20/2023    7:32 AM 01/19/2023    8:34 PM  CBC  WBC 4.0 - 10.5 K/uL 9.6  13.7  12.9   Hemoglobin 13.0 - 17.0 g/dL 7.4  8.2  7.3   Hematocrit 39.0 - 52.0 % 22.9  25.4  23.1   Platelets 150 - 400 K/uL 378  428  424     ABG    Component Value Date/Time   PHART 7.213 (L) 01/09/2023 0009   PCO2ART 52.9 (H) 01/09/2023 0009   PO2ART 170 (H) 01/09/2023 0009   HCO3 28.9 (H) 01/16/2023  0750   TCO2 23 01/09/2023 0009   ACIDBASEDEF 2.3 (H) 01/13/2023 0928   O2SAT 91.7 01/16/2023 0750    CBG (last 3)  Recent Labs    01/21/23 0258 01/21/23 0734 01/21/23 1109  GLUCAP 156* 126* 145*   The patient is critically ill with multiple organ systems failure and requires high complexity decision making for assessment and support, frequent evaluation and titration of therapies, application of advanced monitoring technologies and extensive interpretation of multiple databases. Critical Care Time devoted to patient care services described in this note independent of APP/resident time (if applicable)  is 31 minutes.   Virl Diamond MD [facility] Personal pager: See Amion If unanswered, please page CCM On-call: #[phone]  Updated patient's primary contact Hilda Lias at about 1330 01/21/2023

## 2023-01-22 ENCOUNTER — Inpatient Hospital Stay (HOSPITAL_COMMUNITY): Payer: Medicare Other

## 2023-01-22 DIAGNOSIS — J449 Chronic obstructive pulmonary disease, unspecified: Secondary | ICD-10-CM | POA: Diagnosis not present

## 2023-01-22 DIAGNOSIS — N184 Chronic kidney disease, stage 4 (severe): Secondary | ICD-10-CM

## 2023-01-22 DIAGNOSIS — R1319 Other dysphagia: Secondary | ICD-10-CM | POA: Diagnosis not present

## 2023-01-22 DIAGNOSIS — Z992 Dependence on renal dialysis: Secondary | ICD-10-CM | POA: Diagnosis not present

## 2023-01-22 DIAGNOSIS — N179 Acute kidney failure, unspecified: Secondary | ICD-10-CM | POA: Diagnosis not present

## 2023-01-22 DIAGNOSIS — J9601 Acute respiratory failure with hypoxia: Secondary | ICD-10-CM | POA: Diagnosis not present

## 2023-01-22 DIAGNOSIS — K222 Esophageal obstruction: Secondary | ICD-10-CM

## 2023-01-22 DIAGNOSIS — J189 Pneumonia, unspecified organism: Secondary | ICD-10-CM | POA: Diagnosis not present

## 2023-01-22 LAB — GLUCOSE, CAPILLARY
Glucose-Capillary: 175 mg/dL — ABNORMAL HIGH (ref 70–99)
Glucose-Capillary: 165 mg/dL — ABNORMAL HIGH (ref 70–99)
Glucose-Capillary: 161 mg/dL — ABNORMAL HIGH (ref 70–99)
Glucose-Capillary: 228 mg/dL — ABNORMAL HIGH (ref 70–99)
Glucose-Capillary: 252 mg/dL — ABNORMAL HIGH (ref 70–99)
Glucose-Capillary: 218 mg/dL — ABNORMAL HIGH (ref 70–99)

## 2023-01-22 LAB — RENAL FUNCTION PANEL
Albumin: 1.7 g/dL — ABNORMAL LOW (ref 3.5–5.0)
Anion gap: 9 (ref 5–15)
BUN: 81 mg/dL — ABNORMAL HIGH (ref 8–23)
CO2: 26 mmol/L (ref 22–32)
Calcium: 7.3 mg/dL — ABNORMAL LOW (ref 8.9–10.3)
Chloride: 97 mmol/L — ABNORMAL LOW (ref 98–111)
Creatinine, Ser: 2.59 mg/dL — ABNORMAL HIGH (ref 0.61–1.24)
GFR, Estimated: 24 mL/min — ABNORMAL LOW (ref >60)
Glucose, Bld: 183 mg/dL — ABNORMAL HIGH (ref 70–99)
Phosphorus: 4 mg/dL (ref 2.5–4.6)
Potassium: 4.3 mmol/L (ref 3.5–5.1)
Sodium: 132 mmol/L — ABNORMAL LOW (ref 135–145)

## 2023-01-22 LAB — CBC
HCT: 23.1 % — ABNORMAL LOW (ref 39.0–52.0)
Hemoglobin: 7.4 g/dL — ABNORMAL LOW (ref 13.0–17.0)
MCH: 29.4 pg (ref 26.0–34.0)
MCHC: 32 g/dL (ref 30.0–36.0)
MCV: 91.7 fL (ref 80.0–100.0)
Platelets: 375 10*3/uL (ref 150–400)
RBC: 2.52 MIL/uL — ABNORMAL LOW (ref 4.22–5.81)
RDW: 16.2 % — ABNORMAL HIGH (ref 11.5–15.5)
WBC: 8.7 10*3/uL (ref 4.0–10.5)
nRBC: 0 % (ref 0.0–0.2)

## 2023-01-22 LAB — MAGNESIUM: Magnesium: 1.8 mg/dL (ref 1.7–2.4)

## 2023-01-22 MED ORDER — GLUCERNA 1.5 CAL PO LIQD
1000.0000 mL | ORAL | Status: DC
  Administered 2023-01-22 – 2023-01-23 (×2): 1000 mL
  Filled 2023-01-22 (×6): qty 1000

## 2023-01-22 MED ORDER — CALCIUM CARBONATE ANTACID 500 MG PO CHEW
1.0000 | CHEWABLE_TABLET | Freq: Once | ORAL | Status: AC
  Administered 2023-01-22: 200 mg via ORAL
  Filled 2023-01-22: qty 1

## 2023-01-22 MED ORDER — CLONAZEPAM 0.5 MG PO TBDP
0.5000 mg | ORAL_TABLET | Freq: Two times a day (BID) | ORAL | Status: DC
  Administered 2023-01-22 – 2023-01-24 (×5): 0.5 mg
  Filled 2023-01-22 (×5): qty 1

## 2023-01-22 MED ORDER — MAGNESIUM SULFATE 2 GM/50ML IV SOLN
2.0000 g | Freq: Once | INTRAVENOUS | Status: AC
  Administered 2023-01-22: 2 g via INTRAVENOUS
  Filled 2023-01-22: qty 50

## 2023-01-22 NOTE — TOC Progression Note (Signed)
Transition of Care [facility]) - Initial/Assessment Note    Patient Details  Name: Travis Beck MRN: [id number] Date of Birth: 1946/01/03  Transition of [facility]) CM/SW Contact:    Travis Bathe, LCSW Phone Number: 01/22/2023, 2:59 PM  Clinical Narrative:                 LCSW informed by MD that patient will need to remain inpatient until he can get his EGD tomorrow.  [facility] liaison also notified.   TOC following.    Expected Discharge Plan: Long Term Acute Care (LTAC) Barriers to Discharge: Continued Medical Work up   Patient Goals and CMS Choice Patient states their goals for this hospitalization and ongoing recovery are:: To go to [facility]hab [facility]) CMS Medicare.gov Compare Post Acute Care list provided to:: Patient Choice offered to / list presented to : Patient Travis Beck ownership interest in [facility].provided to:: Patient    Expected Discharge Plan and Services   Discharge Planning Services: CM Consult Post Acute Care Choice: Long Term Acute Care (LTAC) Living arrangements for the past 2 months: Single Family Home                                      Prior Living Arrangements/Services Living arrangements for the past 2 months: Single Family Home Lives with:: Relatives (Lives with brother, [name]) Patient language and need for interpreter reviewed:: Yes Do you feel safe going back to the place where you live?: Yes      Need for Family Participation in Patient Care: Yes (Comment) Care giver support system in place?: Yes (comment) Current home services: DME (Home oxygen at 2L/min [place]) Criminal Activity/Legal Involvement Pertinent to Current Situation/Hospitalization: No - Comment as needed  Activities of Daily Living      Permission Sought/Granted Permission sought to share information with : Case Manager, Magazine features editor Permission granted to share information with : Yes, Verbal Permission Granted     Permission  granted to share info w AGENCY: LTACE - patient [facility]        Emotional Assessment Appearance:: Appears stated age Attitude/Demeanor/Rapport: Gracious Affect (typically observed): Accepting Orientation: : Oriented to Self, Oriented to Place, Oriented to  Time, Oriented to Situation Alcohol / Substance Use: Not Applicable Psych Involvement: No (comment)  Admission diagnosis:  Pneumonia [J18.9] AKI (acute kidney injury) (HCC) [N17.9] Multifocal pneumonia [J18.9] Sepsis due to methicillin resistant Staphylococcus aureus (MRSA) with acute renal failure without septic shock, unspecified acute renal failure type (HCC) [A41.02, R65.20, N17.9] Patient Active Problem List   Diagnosis Date Noted   Sepsis due to methicillin resistant Staphylococcus aureus (MRSA) with acute renal failure without septic shock (HCC) 01/17/2023   AKI (acute kidney injury) (HCC) 01/15/2023   Dysphagia 01/09/2023   Malnutrition of moderate degree 01/08/2023   Abnormal barium swallow 01/08/2023   History of esophageal stricture 01/08/2023   Recurrent pneumonia 01/08/2023   Other dysphagia 01/08/2023   Pneumonia 01/06/2023   Aspiration pneumonia (HCC) 12/07/2022   Acute respiratory failure with hypoxia (HCC) 11/30/2022   COPD with asthma 05/15/2022   Thrush 05/15/2022   Dysuria 04/12/2022   Multifocal pneumonia 11/24/2019   Acute hypoxemic respiratory failure (HCC) 11/24/2019   Esophageal dysphagia 05/06/2019   Anemia, unspecified 05/06/2019   Hypoxemia    Acute exacerbation of COPD with asthma (HCC) 01/25/2019   GERD (gastroesophageal reflux disease) 01/24/2019  Hematemesis without nausea 08/20/2018   Melena 08/20/2018   Chronic bronchitis with emphysema 05/07/2018   Abnormal CT scan of lung 01/22/2018   Pulmonary emphysema (HCC) 01/22/2018   PCP:  Kirstie Peri, MD Pharmacy:   [facility] - [place], [place] - [address] [place] [address] Phone:  [phone] Fax: [phone]  [facility] [phone] - [place], [place] - [address] AT [facility] [address] [place] [address] Phone: [phone] Fax: [phone]  [facility] [address] [place] [address] Phone: [phone] Fax: [phone]  [facility] - [place], [place] - [address] [place] [address] Phone: [phone] Fax: [phone]     Social Determinants of Health (SDOH) Social History: SDOH Screenings   Food Insecurity: No Food Insecurity (12/08/2022)  Housing: Patient Declined (12/08/2022)  Transportation Needs: No Transportation Needs (12/08/2022)  Utilities: Not At Risk (12/08/2022)  Financial Resource Strain: Low Risk  (09/27/2022)  Physical Activity: Inactive (07/31/2022)  Tobacco Use: Medium Risk (01/06/2023)   SDOH Interventions:     Readmission Risk Interventions    01/10/2023    2:05 PM 01/08/2023    3:01 PM  Readmission Risk Prevention Plan  Transportation Screening Complete Complete  PCP or Specialist Appt within 3-5 Days  Complete  HRI or Home Care Consult  Complete  Social Work Consult for Recovery Care Planning/Counseling  Complete  Palliative Care Screening  Not Applicable  Medication Review Oceanographer) Complete Complete  PCP or Specialist appointment within 3-5 days of discharge Complete   HRI or Home Care Consult Complete   SW Recovery Care/Counseling Consult Complete   Palliative Care Screening Complete   Skilled Nursing Facility Not Applicable

## 2023-01-22 NOTE — Progress Notes (Addendum)
NAME:  Tonya Carlile, MRN:  [id number], DOB:  1946-01-31, LOS: 16 ADMISSION DATE:  01/06/2023, CONSULTATION DATE:  01/08/2023 REFERRING MD:  Marland Mcalpine, CHIEF COMPLAINT:  Aspiration Pneumonia, nausea and vomiting   History of Present Illness:  77 y.o. male with medical history significant of IIDM, HTN, SIDAH, Asthma, (Former smoker quit 1994 with a 66 pack year smoking history ) diabetic neuropathy, recurrent pneumonia presented 01/06/2023 with worsening of nauseous vomiting cough and shortness of breath.  Patient was recently hospitalized 5/23-5/26 for multifocal MRSA pneumonia  and septic shock requiring vasopressor on admission-stabilized and subsequently discharged home on Augmentin/doxycycline . Pt returned to [facility] 12/07/2022 with fever of 103.1 and ongoing left sided pleuritic pain. He has a small effusion that was not big enough to tap.  He was admitted for incompletely treated PNA-as sputum cultures finalized postdischarge, and Doxycycline did not cover MRSA. He was treated with Zyvox, and discharged home on Zyvox p.o on 12/13/2022. Initially his breathing symptoms improved with Zyvox however patient continued to experience frequent nausea, vomiting and cough. He presented to the ED 6/29 as these symptoms were worsening.  In the ED he was mildly febrile at 99.6, He had  tachycardia, oxygen sats were 93% on 3 L. CXR again showed a multifocal pneumonia, right sided. Na 128/ Creatinine of 4.2, BUN 66 and WBC of 20. His HGB was 8.7.Respiratory Panel was negative. Lactic Acid trend since admission 2.7>>2.8>>1,3 Pulmonary was  asked to consult for recurrent vs slow to resolve pneumonia and hypoxemia . Of note , patient is followed in the [facility]  Pulmonary clinic by Dr. Isaiah Serge and Rubye Oaks NP.  Per family patient has had about a 15 pound weight loss since the end of May 24 when pneumonia was initially diagnosed.  Pertinent  Medical History  Arthritis, Asthma, HTN, DM type  2  Significant Hospital Events: Including procedures, antibiotic start and stop dates in addition to other pertinent events   5/23-5/26 hospitalization for septic shock from multilobar PNA 5/30 -6/5 fever 103 F at home-ongoing left-sided pleuritic chest pain 6/29 Recurrent pneumonitis 7/01 intubated 7/03 extubated 7/04 complete ABx 7/05 start CRRT, off pressors 7/06 start steroids 7/11 blood back to the ICU for desaturations 7/13-had dialysis 7/15 On RA with sats of 100%, consider dc to [facility] after swallow eval Interim History / Subjective:  RA x > 24 hours More alert and appropriate Denies significant hemoptysis at present Had dialysis 7/13, no UF Denies any pain or discomfort, overall feels better Voiding ( 200 cc's in floor this morning, concentrated)  Objective   BP 136/73   Pulse 84   Temp (!) 96.8 F (36 C) (Axillary)   Resp 17   Ht 5\' 5"  (1.651 m)   Wt 68.8 kg   SpO2 100%   BMI 25.24 kg/m .   Examination: General -chronically ill-appearing, OOB in chair, awake and alert HEENT-dry oral mucosa, NCAT, No LAD Cardiac -S1-S2 appreciated Chest -Bilateral chest excursion, few crackles per bases abdomen -abdomen is soft,NT, ND, BS +,Body mass index is 25.24 kg/m.  Extremities -no clubbing, 1+ BLE  edema Skin: Skin is warm and dry, bruise to right flank Neuro - awake, alert, answering questions appropriately, MAE x 4, ambulating with walker 7/15 CXR stable with Diffuse bilateral interstitial and airspace disease.   Labs reviewed 7/15  BUN 81, creatinine 2.59(2.26) Albumin 1.7 H&H 7.4/23 T Max 98.7  Continues to have good urine output  Resolved Hospital Problem list     Assessment &  Plan:   Acute hypoxemic respiratory failure with multifocal pneumonitis related to aspiration Possible  TRALI after transfusion on 7/10 -Continue BiPAP as needed -Tapering doses of steroids -Continue bronchodilators - OOB to chair  Acute kidney injury Uremic -Tolerated  dialysis well on 7/13 -He has good urine output -Continue to monitoring - Maintain renal perfusion - Avoid nephrotoxic medications - Strict I&O  Acute metabolic encephalopathy secondary to hypoxia and renal failure>> Improving Chronic pain Depression/anxiety -On clonazepam -BuSpar was added  History of asthma -Continue current inhalers  Esophageal dysmotility -Ongoing risk of aspiration - For swallow evaluation 7/15 -Continue PPI  Protein calorie malnutrition Albumin 1.7 Poor appetite ( States nothing tastes good) -Continue tube feeds - Encourage pureed diet  Iron deficiency anemia -Continue to monitor - CBC in am  - Transfuse for HGB < 7  Type 2 diabetes -Continue glargine -Continue SSI  Review ongoing goals of care discussions  History of hyperlipidemia -On pravastatin Discuss in Board Rounds if ready to  DC to [facility] /If bed available  Family was updated 10/12 Family were reassured  that we are doing everything we can to have him stabilize They had questions about a bronchoscopy which they were told from a risk benefit ratio would not be a good idea.  Multifactorial reasons for the hemoptysis, it is small, will continue to monitor H&H has remained stable -A bronchoscopy will be placing him at significant risk of ending up on the ventilator and not able to wean  Best Practice (right click and "Reselect all SmartList Selections" daily)   Diet/type: tube feeds DVT prophylaxis: SQ heparin GI prophylaxis: protonix Lines: Rt internal jugular HD catheter Foley:  Yes, still needed Code Status:  full code Last date of multidisciplinary goals of care discussion (updated family at bedside 7/11)  Labs       Latest Ref Rng & Units 01/22/2023    1:27 AM 01/21/2023    2:38 AM 01/20/2023    7:32 AM  CMP  Glucose 70 - 99 mg/dL 409  811  914   BUN 8 - 23 mg/dL 81  67  51   Creatinine 0.61 - 1.24 mg/dL 7.82  9.56  2.13   Sodium 135 - 145 mmol/L 132  133  132    Potassium 3.5 - 5.1 mmol/L 4.3  4.2  4.0   Chloride 98 - 111 mmol/L 97  97  93   CO2 22 - 32 mmol/L 26  27  26    Calcium 8.9 - 10.3 mg/dL 7.3  7.3  7.4       Latest Ref Rng & Units 01/22/2023    1:27 AM 01/21/2023    2:38 AM 01/20/2023    7:32 AM  CBC  WBC 4.0 - 10.5 K/uL 8.7  9.6  13.7   Hemoglobin 13.0 - 17.0 g/dL 7.4  7.4  8.2   Hematocrit 39.0 - 52.0 % 23.1  22.9  25.4   Platelets 150 - 400 K/uL 375  378  428     ABG    Component Value Date/Time   PHART 7.213 (L) 01/09/2023 0009   PCO2ART 52.9 (H) 01/09/2023 0009   PO2ART 170 (H) 01/09/2023 0009   HCO3 28.9 (H) 01/16/2023 0750   TCO2 23 01/09/2023 0009   ACIDBASEDEF 2.3 (H) 01/13/2023 0928   O2SAT 91.7 01/16/2023 0750    CBG (last 3)  Recent Labs    01/21/23 2322 01/22/23 0303 01/22/23 0741  GLUCAP 195* 175* 165*   The patient is  critically ill with multiple organ systems failure and requires high complexity decision making for assessment and support, frequent evaluation and titration of therapies, application of advanced monitoring technologies and extensive interpretation of multiple databases. Critical Care Time devoted to patient care services described in this note independent of APP/resident time (if applicable)  is 33 minutes.   Bevelyn Ngo, MSN, AGACNP-BC [facility] See Amion for personal pager PCCM on call pager [phone]  01/22/2023 9:05 AM

## 2023-01-22 NOTE — Progress Notes (Signed)
Patient ID: Travis Beck, male   DOB: 1945/11/13, 77 y.o.   MRN: [id number] S:  OOB in chair this AM, no sig c/o FOr barium swallow eval today 2.1L UOP, SCR 2.3 to 2.6, BUN 67 to 81, K 4.3 Has Temp R internal jugular HD Cath currently  O:BP (!) 150/78   Pulse 82   Temp (!) 96.8 F (36 C) (Axillary)   Resp (!) 26   Ht 5\' 5"  (1.651 m)   Wt 68.8 kg   SpO2 99%   BMI 25.24 kg/m   Intake/Output Summary (Last 24 hours) at 01/22/2023 0940 Last data filed at 01/22/2023 0800 Gross per 24 hour  Intake 1580 ml  Output 2100 ml  Net -520 ml    Gen: awake in chair, NG in place with TF CVS: RRR Resp: occ rhonchi Abd: +BS, soft, NT/ND Ext: no edema  Recent Labs  Lab 01/18/23 0738 01/18/23 1545 01/19/23 0403 01/19/23 2034 01/20/23 0732 01/21/23 0238 01/22/23 0127  NA 134* 135 137 131* 132* 133* 132*  K 5.1 5.3* 5.2* 5.2* 4.0 4.2 4.3  CL 99 98 95* 95* 93* 97* 97*  CO2 25 25 24 23 26 27 26   GLUCOSE 122* 143* 89 207* 191* 149* 183*  BUN 80* 88* 94* 105* 51* 67* 81*  CREATININE 2.84* 2.99* 3.02* 3.06* 1.89* 2.26* 2.59*  ALBUMIN 1.7* 1.7* 1.8* 1.8* 1.8* 1.7* 1.7*  CALCIUM 7.6* 7.5* 8.0* 7.4* 7.4* 7.3* 7.3*  PHOS 6.3* 6.8* 7.8* 6.6* 3.8 4.0 4.0   Liver Function Tests: Recent Labs  Lab 01/20/23 0732 01/21/23 0238 01/22/23 0127  ALBUMIN 1.8* 1.7* 1.7*   No results for input(s): "LIPASE", "AMYLASE" in the last 168 hours. No results for input(s): "AMMONIA" in the last 168 hours. CBC: Recent Labs  Lab 01/18/23 0738 01/19/23 2034 01/20/23 0732 01/21/23 0238 01/22/23 0127  WBC 18.1* 12.9* 13.7* 9.6 8.7  HGB 8.1* 7.3* 8.2* 7.4* 7.4*  HCT 24.9* 23.1* 25.4* 22.9* 23.1*  MCV 85.9 89.5 87.9 89.8 91.7  PLT 578* 424* 428* 378 375   Cardiac Enzymes: No results for input(s): "CKTOTAL", "CKMB", "CKMBINDEX", "TROPONINI" in the last 168 hours. CBG: Recent Labs  Lab 01/21/23 1513 01/21/23 1941 01/21/23 2322 01/22/23 0303 01/22/23 0741  GLUCAP 207* 247* 195* 175* 165*     Iron Studies: No results for input(s): "IRON", "TIBC", "TRANSFERRIN", "FERRITIN" in the last 72 hours. Studies/Results: DG CHEST PORT 1 VIEW  Result Date: 01/22/2023 CLINICAL DATA:  Shortness of breath. EXAM: PORTABLE CHEST 1 VIEW COMPARISON:  01/17/2023 FINDINGS: A feeding tube passes into the stomach although the distal tip position is not included on the film. Right IJ central line tip overlies the proximal SVC level. Diffuse bilateral interstitial and airspace disease is similar to prior. Cardiopericardial silhouette is at upper limits of normal for size. Telemetry leads overlie the chest. IMPRESSION: 1. No substantial interval change. 2. Diffuse bilateral interstitial and airspace disease. Electronically Signed   By: Kennith Center M.D.   On: 01/22/2023 07:54    amLODipine  10 mg Per Tube Daily   arformoterol  15 mcg Nebulization BID   aspirin  81 mg Per Tube Daily   budesonide (PULMICORT) nebulizer solution  0.5 mg Nebulization BID   busPIRone  5 mg Per Tube TID   Chlorhexidine Gluconate Cloth  6 each Topical Daily   Chlorhexidine Gluconate Cloth  6 each Topical Q0600   clonazepam  1 mg Per Tube BID   docusate  100 mg Per Tube BID  feeding supplement  237 mL Oral BID BM   fluticasone  2 spray Each Nare Daily   heparin  5,000 Units Subcutaneous Q8H   insulin aspart  0-20 Units Subcutaneous Q4H   insulin glargine-yfgn  10 Units Subcutaneous BID   lidocaine  1 patch Transdermal Q24H   loratadine  10 mg Per Tube Q48H   methylPREDNISolone (SOLU-MEDROL) injection  60 mg Intravenous Daily   Followed by   Melene Muller ON 01/25/2023] methylPREDNISolone (SOLU-MEDROL) injection  50 mg Intravenous Daily   Followed by   Melene Muller ON 01/28/2023] predniSONE  40 mg Per Tube Q breakfast   Followed by   Melene Muller ON 01/31/2023] predniSONE  30 mg Per Tube Q breakfast   Followed by   Melene Muller ON 02/03/2023] predniSONE  20 mg Per Tube Q breakfast   Followed by   Melene Muller ON 02/06/2023] predniSONE  10 mg Per Tube Q  breakfast   montelukast  10 mg Per Tube QHS   multivitamin  1 tablet Per Tube QHS   mouth rinse  15 mL Mouth Rinse 4 times per day   pantoprazole (PROTONIX) IV  40 mg Intravenous QHS   polyethylene glycol  17 g Per Tube Daily   pramipexole  0.5 mg Per Tube QHS   pravastatin  20 mg Per Tube QPM   revefenacin  175 mcg Nebulization Daily   saccharomyces boulardii  250 mg Per Tube BID   senna  2 tablet Per Tube QHS   traZODone  50 mg Per Tube QHS    BMET    Component Value Date/Time   NA 132 (L) 01/22/2023 0127   K 4.3 01/22/2023 0127   CL 97 (L) 01/22/2023 0127   CO2 26 01/22/2023 0127   GLUCOSE 183 (H) 01/22/2023 0127   BUN 81 (H) 01/22/2023 0127   CREATININE 2.59 (H) 01/22/2023 0127   CALCIUM 7.3 (L) 01/22/2023 0127   GFRNONAA 24 (L) 01/22/2023 0127   GFRAA >60 12/15/2019 0812   CBC    Component Value Date/Time   WBC 8.7 01/22/2023 0127   RBC 2.52 (L) 01/22/2023 0127   HGB 7.4 (L) 01/22/2023 0127   HCT 23.1 (L) 01/22/2023 0127   PLT 375 01/22/2023 0127   MCV 91.7 01/22/2023 0127   MCH 29.4 01/22/2023 0127   MCHC 32.0 01/22/2023 0127   RDW 16.2 (H) 01/22/2023 0127   LYMPHSABS 0.5 (L) 01/12/2023 0047   MONOABS 0.5 01/12/2023 0047   EOSABS 0.0 01/12/2023 0047   BASOSABS 0.0 01/12/2023 0047    Assessment/Plan:  AKI - presumably ischemic ATN in setting of sepsis due to pneumonia and poor po intake. Vanco trough was high at 28.  UA with blood and protein but many bacteria and WBC too, low susp for pulm renal and serologies neg ANCA and antiGBM. BUN/Cr continued to climb.  CRRT initiated after right internal jugular catheter placed by PCCM 7/6 which continued until 7/8 PM.  In setting of worsening azotemia did HD 7/13 for clearance, no UF needed UOP excellent, watching daily labs for now, No HD needed today,  Cont daily reassessments.  If cont to need HD will need tunneled HD cath placed; re-eval tomorrow.  OK with redosing lasix as clinically indicated.  Volume looks ok  for now.  Avoid nephrotoxic medications including NSAIDs and iodinated intravenous contrast exposure unless the latter is absolutely indicated.  Preferred narcotic agents for pain control are hydromorphone, fentanyl, and methadone. Morphine should not be used. Avoid Baclofen and avoid oral sodium phosphate  and magnesium citrate based laxatives / bowel preps. Continue strict Input and Output monitoring. Will monitor the patient closely with you and intervene or adjust therapy as indicated by changes in clinical status/labs   AMS - delerium off/on.  Currently ok.  Sepsis - due to recurrent multifocal pneumonia - suspected aspiration.  Improved Acute hypoxic respiratory failure - required intubation now extubated.  Improving. Iron deficiency anemia - continue to follow, Hb 7s, defer transfusion to primary.  Severe protein malnutrition - alb 1.5.  cortrak and supplements per PCCM. Esophageal dysmotility / dysphagia - GI and speech path following.   Arita Miss, MD  [facility]

## 2023-01-22 NOTE — Progress Notes (Signed)
eLink Physician-Brief Progress Note Patient Name: Travis Beck DOB: 1945-09-20 MRN: [id number]   Date of Service  01/22/2023  HPI/Events of Note  Pt with c/o heart burn even after taking his protonix at 2200. Can take POs  Going for Barium swallow study in AM, for esophageal dysmotility. Refused EGD.    eICU Interventions  Tums trial.      Intervention Category Intermediate Interventions: Other:  Ranee Gosselin 01/22/2023, 2:24 AM

## 2023-01-22 NOTE — Progress Notes (Addendum)
Daily Progress Note  DOA: 01/06/2023 Hospital Day: 17 Chief Complaint: dysphagia   Attending physician's note   I have taken a history, reviewed the chart and examined the patient. I performed a substantive portion of this encounter, including complete performance of at least one of the key components, in conjunction with the APP. I agree with the APP's note, impression and recommendations.    77 year old very pleasant gentleman admitted with recurrent aspiration pneumonia, respiratory failure requiring intubation, AKI on dialysis GI consulted for dysphagia, he had modified barium swallow on June 30 which showed barium stasis including barium tablet in the distal esophagus concerning for distal esophageal stricture  His family is unable to agree to come to a decision.    (His daughter does not want him to proceed with EGD.  She would prefer for him to have barium esophagram first, explained there is potential risk for aspiration and possible unnecessary procedure as he may end up getting EGD following the barium study, delay in care.  Heidrich on the other hand wants to proceed with EGD, I discussed in detail with both the benefits and risks associated with EGD as well as the esophagram.)   The patient and family was provided an opportunity to ask questions and all were answered.   Iona Beard , MD [phone]     Assessment and Plan:    Brief Narrative:  Cylas Falzone is a 77 y.o. year old male with a past medical history not limited to diabetes mellitus, hypertension, SIADH, recurrent pneumonia   He has had a prolonged hospitalization for recurrent pneumonia / respiratory failure requiring intubation. Hospital course complicated by AKI requiring dialysis. We saw in consult 7/14 for dysphagia. Patient followed by GI in [place].    Recurrent pneumonia felt to be 2/2 to aspiration. Has been receiving enteral feedings. His dysphagia symptoms are not clear cut but  seem to be a mixture of N/V ( ? Maybe from antibiotics at home) and occasional sticking of food in esophagus -EGD April 2022 per Dr. Levon Hedger done for complaints of dysphagia and heartburn- there was no endoscopic abnormality evident in the esophagus to explain his complaints of dysphagia. There was suspicion for short segment Barrett's.Bx negative for intestinal metaplasia. -He had speech path eval on 01/07/2023 with modified swallowing study, had normal oral phase of swallow and pharyngeal phase, comment was that his dysphagia appears to be primarily esophageal that he did have some delay of initiation of swallow with thin liquids, and a trace amount of thin liquid barium entered laryngeal vestibule but remained above the vocal cords, no significant pharyngeal residuals 13 mm barium tablet passed through PES without difficulty over there was barium stasis including the tablet served in the distal esophagus  -I am just coming on service today but our plan from yesterday's GI team was to obtain a barium swallow with tablet as patient was high risk for sedation / procedure. PCCM asking if we could proceed with EGD.I spoke with both daughter, one in room and another was on speaker phone when I walked in). Daughters are not opposed to an EGD if we felt pretty clear that there was an esophageal issue. I will discuss with Dr. Lavon Paganini ( on service starting today) to see if she is willing to bypass barium swallow and move forward with EGD. I will say that patient told me that his empiric dilation in 2022 did help.   [place] anemia with normal ferritin, low TIBC, low serum  iron. Iron deficiency ? anemia.  Hgb low but stable at 7.4  AKI requiring dialysis  Subjective / New Events:   No particular complaints at present. Supposed to be going to [facility] soon   Objective:   Recent Labs    01/20/23 0732 01/21/23 0238 01/22/23 0127  WBC 13.7* 9.6 8.7  HGB 8.2* 7.4* 7.4*  HCT 25.4* 22.9* 23.1*  PLT  428* 378 375   BMET Recent Labs    01/20/23 0732 01/21/23 0238 01/22/23 0127  NA 132* 133* 132*  K 4.0 4.2 4.3  CL 93* 97* 97*  CO2 26 27 26   GLUCOSE 191* 149* 183*  BUN 51* 67* 81*  CREATININE 1.89* 2.26* 2.59*  CALCIUM 7.4* 7.3* 7.3*   LFT Recent Labs    01/22/23 0127  ALBUMIN 1.7*   PT/INR No results for input(s): "LABPROT", "INR" in the last 72 hours.   Imaging:  DG CHEST PORT 1 VIEW CLINICAL DATA:  Shortness of breath.  EXAM: PORTABLE CHEST 1 VIEW  COMPARISON:  01/17/2023  FINDINGS: A feeding tube passes into the stomach although the distal tip position is not included on the film. Right IJ central line tip overlies the proximal SVC level. Diffuse bilateral interstitial and airspace disease is similar to prior. Cardiopericardial silhouette is at upper limits of normal for size. Telemetry leads overlie the chest.  IMPRESSION: 1. No substantial interval change. 2. Diffuse bilateral interstitial and airspace disease.  Electronically Signed   By: Kennith Center M.D.   On: 01/22/2023 07:54     Scheduled inpatient medications:   amLODipine  10 mg Per Tube Daily   arformoterol  15 mcg Nebulization BID   aspirin  81 mg Per Tube Daily   budesonide (PULMICORT) nebulizer solution  0.5 mg Nebulization BID   busPIRone  5 mg Per Tube TID   Chlorhexidine Gluconate Cloth  6 each Topical Daily   Chlorhexidine Gluconate Cloth  6 each Topical Q0600   clonazepam  1 mg Per Tube BID   docusate  100 mg Per Tube BID   feeding supplement  237 mL Oral BID BM   fluticasone  2 spray Each Nare Daily   heparin  5,000 Units Subcutaneous Q8H   insulin aspart  0-20 Units Subcutaneous Q4H   insulin glargine-yfgn  10 Units Subcutaneous BID   lidocaine  1 patch Transdermal Q24H   loratadine  10 mg Per Tube Q48H   methylPREDNISolone (SOLU-MEDROL) injection  60 mg Intravenous Daily   Followed by   Melene Muller ON 01/25/2023] methylPREDNISolone (SOLU-MEDROL) injection  50 mg  Intravenous Daily   Followed by   Melene Muller ON 01/28/2023] predniSONE  40 mg Per Tube Q breakfast   Followed by   Melene Muller ON 01/31/2023] predniSONE  30 mg Per Tube Q breakfast   Followed by   Melene Muller ON 02/03/2023] predniSONE  20 mg Per Tube Q breakfast   Followed by   Melene Muller ON 02/06/2023] predniSONE  10 mg Per Tube Q breakfast   montelukast  10 mg Per Tube QHS   multivitamin  1 tablet Per Tube QHS   mouth rinse  15 mL Mouth Rinse 4 times per day   pantoprazole (PROTONIX) IV  40 mg Intravenous QHS   polyethylene glycol  17 g Per Tube Daily   pramipexole  0.5 mg Per Tube QHS   pravastatin  20 mg Per Tube QPM   revefenacin  175 mcg Nebulization Daily   saccharomyces boulardii  250 mg Per Tube BID  senna  2 tablet Per Tube QHS   traZODone  50 mg Per Tube QHS   Continuous inpatient infusions:   sodium chloride Stopped (01/11/23 0820)   anticoagulant sodium citrate     feeding supplement (GLUCERNA 1.5 CAL) 50 mL/hr at 01/22/23 0800   magnesium sulfate bolus IVPB     PRN inpatient medications: acetaminophen, albuterol, ALPRAZolam, alteplase, anticoagulant sodium citrate, buprenorphine, gabapentin, heparin, lidocaine (PF), lidocaine-prilocaine, mouth rinse, pentafluoroprop-tetrafluoroeth, phenol  Vital signs in last 24 hours: Temp:  [96.8 F (36 C)-98.2 F (36.8 C)] 96.8 F (36 C) (07/15 0743) Pulse Rate:  [50-93] 82 (07/15 0900) Resp:  [17-26] 26 (07/15 0900) BP: (113-154)/(49-105) 150/78 (07/15 0900) SpO2:  [90 %-100 %] 99 % (07/15 0900) Weight:  [68.8 kg] 68.8 kg (07/15 0500) Last BM Date : 01/21/23  Intake/Output Summary (Last 24 hours) at 01/22/2023 1048 Last data filed at 01/22/2023 0800 Gross per 24 hour  Intake 1530 ml  Output 2100 ml  Net -570 ml    Intake/Output from previous day: 07/14 0701 - 07/15 0700 In: 1680 [P.O.:220; NG/GT:1460] Out: 2100 [Urine:2100] Intake/Output this shift: Total I/O In: 50 [NG/GT:50] Out: -    Physical Exam:  General: Alert male in  NAD Heart:  Regular rate and rhythm.  Pulmonary: Normal respiratory effort Abdomen: Soft, mildly distended, nontender. Normal bowel sounds. Neurologic: Alert and oriented Psych: Pleasant. Cooperative.   Principal Problem:   Pneumonia Active Problems:   Acute exacerbation of COPD with asthma (HCC)   Acute respiratory failure with hypoxia (HCC)   Aspiration pneumonia (HCC)   Malnutrition of moderate degree   Abnormal barium swallow   History of esophageal stricture   Recurrent pneumonia   Other dysphagia   Dysphagia   AKI (acute kidney injury) (HCC)   Sepsis due to methicillin resistant Staphylococcus aureus (MRSA) with acute renal failure without septic shock (HCC)     LOS: 16 days   Willette Cluster ,NP 01/22/2023, 10:48 AM

## 2023-01-22 NOTE — Discharge Summary (Signed)
Physician Discharge Summary  Patient ID: Travis Beck MRN: [id number] DOB/AGE: [birth year] 77 y.o.  Admit date: 01/06/2023 Discharge date: 01/22/2023  Admission Diagnoses: Acute respiratory failure with hypoxia Aspiration pneumonia   Discharge Diagnoses:  Principal Problem:   Pneumonia Active Problems:   Acute exacerbation of COPD with asthma (HCC)   Acute respiratory failure with hypoxia (HCC)   Aspiration pneumonia (HCC)   Malnutrition of moderate degree   Abnormal barium swallow   History of esophageal stricture   Recurrent pneumonia   Other dysphagia   Dysphagia   AKI (acute kidney injury) (HCC)   Sepsis due to methicillin resistant Staphylococcus aureus (MRSA) with acute renal failure without septic shock (HCC) Candida esophagitis Asthma with acute exacerbation Azotemia Aspiration pneumonitis Aspiration pneumonia GERD Severe protein calorie malnutrition IDA Pathologic weight loss DM 2 Restless leg syndrome History of HLD  Discharged Condition: stable  Hospital Course:  77 y.o. male with medical history significant of IIDM, HTN, SIDAH, COPD, ( Former smoker quit 1994 with a 66 pack year smoking history diabetic neuropathy, recurrent pneumonia presented 01/06/2023 with worsening of nauseous vomiting cough and shortness of breath.  Patient was recently hospitalized 5/23-5/26 for multifocal MRSA pneumonia  and septic shock requiring vasopressor on admission-stabilized and subsequently discharged home on Augmentin/doxycycline . Pt returned to [facility] 12/07/2022 with fever of 103.1 and ongoing left sided pleuritic pain. He has a small effusion that was not big enough to tap.  He was admitted for incompletely treated PNA-as sputum cultures finalized postdischarge, and Doxycycline did not cover MRSA. He was treated with Zyvox, and discharged home on Zyvox p.o on 12/13/2022. Initially his breathing symptoms improved with Zyvox however patient continued to  experience frequent nausea, vomiting and cough. He presented to the ED 6/29 as these symptoms were worsening.  In the ED he was mildly febrile at 99.6, He had  tachycardia, oxygen sats were 93% on 3 L. CXR again showed a multifocal pneumonia, right sided. Na 128/ Creatinine of 4.2, BUN 66 and WBC of 20. His HGB was 8.7.Respiratory Panel was negative. Lactic Acid trend since admission 2.7>>2.8>>1,3 Pulmonary was  asked to consult for recurrent vs slow to resolve pneumonia and hypoxemia . Of note , patient [facility] by Dr. Isaiah Serge and Rubye Oaks NP. Per family patient has had about a 15 pound weight loss since the end of May 24 when pneumonia was initially diagnosed.  5/23-5/26 hospitalization for septic shock from multilobar PNA 5/30 -6/5 fever 103 F at home-ongoing left-sided pleuritic chest pain 6/29 Recurrent pneumonitis 7/01 intubated 7/03 extubated 7/04 complete ABx 7/05 start CRRT, off pressors 7/06 start steroids 7/11 blood back to the ICU for desaturations 7/13-had dialysis 7/15 On RA with sats of 100%, needs EGD 7/17 EGD- biopsy of yellow plaques, started diflucan, empiric whole esophagus dilation.  7/18 Seen by SLP and reccommended dysphagia 3 diet. Tolerating PO intake   Consults: pulmonary/intensive care and GI  Significant Diagnostic Studies:       Latest Ref Rng & Units 01/24/2023   10:44 AM 01/23/2023    3:08 AM 01/22/2023    1:27 AM  CBC  WBC 4.0 - 10.5 K/uL  9.1  8.7   Hemoglobin 13.0 - 17.0 g/dL 8.2  7.5  7.4   Hematocrit 39.0 - 52.0 % 25.1  23.3  23.1   Platelets 150 - 400 K/uL  355  375       Latest Ref Rng & Units 01/24/2023  3:25 AM 01/23/2023    3:08 AM 01/22/2023    1:27 AM  BMP  Glucose 70 - 99 mg/dL 83  102  725   BUN 8 - 23 mg/dL 92  89  81   Creatinine 0.61 - 1.24 mg/dL 3.66  4.40  3.47   Sodium 135 - 145 mmol/L 134  133  132   Potassium 3.5 - 5.1 mmol/L 3.7  4.2  4.3   Chloride 98 - 111 mmol/L 99  96  97    CO2 22 - 32 mmol/L 25  26  26    Calcium 8.9 - 10.3 mg/dL 7.8  7.7  7.3      4/25 blood culture: NG Urine culture NG Respiratory culture: candida albicans  Treatments: mechanical ventilation, antibiotics, steroids, EGD with empiric esophageal dilation  Discharge Exam: Blood pressure (!) 145/77, pulse 88, temperature (!) 97.4 F (36.3 C), temperature source Axillary, resp. rate (!) 24, height 5\' 5"  (1.651 m), weight 68.8 kg, SpO2 94%. Blood pressure 114/82, pulse 61, temperature (!) 96 F (35.6 C), temperature source Axillary, resp. rate 19, height 5\' 5"  (1.651 m), weight 68.6 kg, SpO2 99%. Gen:      No acute distress HEENT:  EOMI, sclera anicteric Neck:     No masses; no thyromegaly Lungs:    Clear to auscultation bilaterally; normal respiratory effort CV:         Regular rate and rhythm; no murmurs Abd:      + bowel sounds; soft, non-tender; no palpable masses, no distension Ext:    No edema; adequate peripheral perfusion Skin:      Warm and dry; no rash Neuro: alert and oriented x 3 Psych: normal mood and affect   Disposition: [facility]   Allergies as of 01/25/2023       Reactions   Penicillins Rash   No problems with ampicillin during hospitalization        Medication List     STOP taking these medications    glipiZIDE 10 MG 24 hr tablet Commonly known as: GLUCOTROL XL   metFORMIN 1000 MG tablet Commonly known as: GLUCOPHAGE   Mucus Relief 600 MG 12 hr tablet Generic drug: guaiFENesin       TAKE these medications    acetaminophen 325 MG tablet Commonly known as: TYLENOL Take 2 tablets (650 mg total) by mouth every 6 (six) hours as needed for mild pain (or Fever >/= 101).   albuterol 108 (90 Base) MCG/ACT inhaler Commonly known as: VENTOLIN HFA Inhale 2 puffs into the lungs every 4 (four) hours as needed for wheezing or shortness of breath. What changed: how much to take   albuterol (2.5 MG/3ML) 0.083% nebulizer solution Commonly known as:  PROVENTIL Take 3 mLs (2.5 mg total) by nebulization every 4 (four) hours as needed for wheezing or shortness of breath. What changed: You were already taking a medication with the same name, and this prescription was added. Make sure you understand how and when to take each.   ALPRAZolam 1 MG tablet Commonly known as: XANAX Take 1 tablet (1 mg total) by mouth 3 (three) times daily as needed for anxiety. What changed:  how much to take when to take this reasons to take this   amLODipine 10 MG tablet Commonly known as: NORVASC Take 10 mg by mouth at bedtime.   Breztri Aerosphere 160-9-4.8 MCG/ACT Aero Generic drug: Budeson-Glycopyrrol-Formoterol Inhale 2 puffs into the lungs in the morning and at bedtime.   buprenorphine 8 MG  Subl SL tablet Commonly known as: SUBUTEX Place 4 mg under the tongue as needed (pain).   cetirizine 10 MG tablet Commonly known as: ZYRTEC Take 10 mg by mouth at bedtime.   famotidine 20 MG tablet Commonly known as: PEPCID Take 1 tablet (20 mg total) by mouth 2 (two) times daily for 7 days. Take one tablet twice daily for two days What changed:  when to take this reasons to take this additional instructions   fluconazole 100 MG tablet Commonly known as: DIFLUCAN Take 1 tablet (100 mg total) by mouth daily. Start taking on: January 26, 2023   fluticasone 50 MCG/ACT nasal spray Commonly known as: FLONASE Place 1 spray into both nostrils daily.   gabapentin 400 MG capsule Commonly known as: NEURONTIN Take 1 capsule (400 mg total) by mouth every 8 (eight) hours as needed. What changed:  how much to take when to take this reasons to take this   insulin aspart 100 UNIT/ML injection Commonly known as: novoLOG Inject 3 Units into the skin 3 (three) times daily with meals.   insulin aspart 100 UNIT/ML injection Commonly known as: novoLOG Inject 0-5 Units into the skin at bedtime.   insulin aspart 100 UNIT/ML injection Commonly known as:  novoLOG Inject 0-15 Units into the skin 3 (three) times daily with meals.   insulin glargine-yfgn 100 UNIT/ML injection Commonly known as: SEMGLEE Inject 0.1 mLs (10 Units total) into the skin 2 (two) times daily.   methylPREDNISolone sodium succinate 125 mg/2 mL injection Commonly known as: SOLU-MEDROL Inject 0.8 mLs (50 mg total) into the vein daily. Start taking on: January 26, 2023   montelukast 10 MG tablet Commonly known as: SINGULAIR Take 1 tablet (10 mg total) by mouth at bedtime.   ondansetron 4 MG disintegrating tablet Commonly known as: ZOFRAN-ODT Take 1 tablet (4 mg total) by mouth every 8 (eight) hours as needed for nausea or vomiting. Consider taking 30 minutes prior to meals. What changed:  when to take this additional instructions   pantoprazole 40 MG tablet Commonly known as: PROTONIX Take 1 tablet (40 mg total) by mouth daily.   pramipexole 0.5 MG tablet Commonly known as: MIRAPEX Take 0.5 mg by mouth at bedtime.   pravastatin 20 MG tablet Commonly known as: PRAVACHOL Take 20 mg by mouth every evening.   predniSONE 20 MG tablet Commonly known as: DELTASONE Take 2 tablets (40 mg total) by mouth daily with breakfast. Start taking on: January 28, 2023   predniSONE 10 MG tablet Commonly known as: DELTASONE Take 3 tablets (30 mg total) by mouth daily with breakfast. Start taking on: January 31, 2023   predniSONE 20 MG tablet Commonly known as: DELTASONE Take 1 tablet (20 mg total) by mouth daily with breakfast. Start taking on: February 03, 2023   predniSONE 10 MG tablet Commonly known as: DELTASONE Take 1 tablet (10 mg total) by mouth daily with breakfast. Start taking on: February 06, 2023   sucralfate 1 g tablet Commonly known as: Carafate Take 1 tablet (1 g total) by mouth 4 (four) times daily -  with meals and at bedtime.   valsartan 160 MG tablet Commonly known as: DIOVAN Take 160 mg by mouth daily.         Signed: Chilton Greathouse MD [facility]  Pulmonary & Critical care See Amion for pager  If no response to pager , please call [phone] until 7pm After 7:00 pm call Elink  [phone] 01/25/2023, 2:24 PM

## 2023-01-23 ENCOUNTER — Inpatient Hospital Stay (HOSPITAL_COMMUNITY): Payer: Medicare Other

## 2023-01-23 DIAGNOSIS — J189 Pneumonia, unspecified organism: Secondary | ICD-10-CM | POA: Diagnosis not present

## 2023-01-23 DIAGNOSIS — R1319 Other dysphagia: Secondary | ICD-10-CM | POA: Diagnosis not present

## 2023-01-23 DIAGNOSIS — E43 Unspecified severe protein-calorie malnutrition: Secondary | ICD-10-CM | POA: Diagnosis not present

## 2023-01-23 DIAGNOSIS — N179 Acute kidney failure, unspecified: Secondary | ICD-10-CM | POA: Diagnosis not present

## 2023-01-23 DIAGNOSIS — Z992 Dependence on renal dialysis: Secondary | ICD-10-CM | POA: Diagnosis not present

## 2023-01-23 DIAGNOSIS — J9601 Acute respiratory failure with hypoxia: Secondary | ICD-10-CM | POA: Diagnosis not present

## 2023-01-23 DIAGNOSIS — J690 Pneumonitis due to inhalation of food and vomit: Secondary | ICD-10-CM | POA: Diagnosis not present

## 2023-01-23 LAB — RENAL FUNCTION PANEL
Albumin: 1.8 g/dL — ABNORMAL LOW (ref 3.5–5.0)
Anion gap: 11 (ref 5–15)
BUN: 89 mg/dL — ABNORMAL HIGH (ref 8–23)
CO2: 26 mmol/L (ref 22–32)
Calcium: 7.7 mg/dL — ABNORMAL LOW (ref 8.9–10.3)
Chloride: 96 mmol/L — ABNORMAL LOW (ref 98–111)
Creatinine, Ser: 2.65 mg/dL — ABNORMAL HIGH (ref 0.61–1.24)
GFR, Estimated: 24 mL/min — ABNORMAL LOW (ref >60)
Glucose, Bld: 111 mg/dL — ABNORMAL HIGH (ref 70–99)
Phosphorus: 4.3 mg/dL (ref 2.5–4.6)
Potassium: 4.2 mmol/L (ref 3.5–5.1)
Sodium: 133 mmol/L — ABNORMAL LOW (ref 135–145)

## 2023-01-23 LAB — CBC WITH DIFFERENTIAL/PLATELET
Abs Immature Granulocytes: 0.04 10*3/uL (ref 0.00–0.07)
Basophils Absolute: 0 10*3/uL (ref 0.0–0.1)
Basophils Relative: 0 %
Eosinophils Absolute: 0 10*3/uL (ref 0.0–0.5)
Eosinophils Relative: 0 %
HCT: 23.3 % — ABNORMAL LOW (ref 39.0–52.0)
Hemoglobin: 7.5 g/dL — ABNORMAL LOW (ref 13.0–17.0)
Immature Granulocytes: 0 %
Lymphocytes Relative: 8 %
Lymphs Abs: 0.8 10*3/uL (ref 0.7–4.0)
MCH: 29.6 pg (ref 26.0–34.0)
MCHC: 32.2 g/dL (ref 30.0–36.0)
MCV: 92.1 fL (ref 80.0–100.0)
Monocytes Absolute: 0.6 10*3/uL (ref 0.1–1.0)
Monocytes Relative: 6 %
Neutro Abs: 7.8 10*3/uL — ABNORMAL HIGH (ref 1.7–7.7)
Neutrophils Relative %: 86 %
Platelets: 355 10*3/uL (ref 150–400)
RBC: 2.53 MIL/uL — ABNORMAL LOW (ref 4.22–5.81)
RDW: 16.6 % — ABNORMAL HIGH (ref 11.5–15.5)
WBC: 9.1 10*3/uL (ref 4.0–10.5)
nRBC: 0 % (ref 0.0–0.2)

## 2023-01-23 LAB — GLUCOSE, CAPILLARY
Glucose-Capillary: 102 mg/dL — ABNORMAL HIGH (ref 70–99)
Glucose-Capillary: 141 mg/dL — ABNORMAL HIGH (ref 70–99)
Glucose-Capillary: 186 mg/dL — ABNORMAL HIGH (ref 70–99)
Glucose-Capillary: 236 mg/dL — ABNORMAL HIGH (ref 70–99)
Glucose-Capillary: 240 mg/dL — ABNORMAL HIGH (ref 70–99)
Glucose-Capillary: 316 mg/dL — ABNORMAL HIGH (ref 70–99)

## 2023-01-23 LAB — MAGNESIUM: Magnesium: 2.3 mg/dL (ref 1.7–2.4)

## 2023-01-23 MED ORDER — TAMSULOSIN HCL 0.4 MG PO CAPS
0.4000 mg | ORAL_CAPSULE | Freq: Every day | ORAL | Status: DC
  Administered 2023-01-23 – 2023-01-24 (×2): 0.4 mg via ORAL
  Filled 2023-01-23 (×2): qty 1

## 2023-01-23 NOTE — Progress Notes (Signed)
Patient ID: Travis Beck, male   DOB: 04-02-1946, 77 y.o.   MRN: [id number] S:  No interval events Havign some anxiety 1.6L UOP, stable SCr and BUN, K 4.2 Family at bedside, updated  O:BP (!) 136/58   Pulse 95   Temp 97.8 F (36.6 C) (Axillary)   Resp (!) 28   Ht 5\' 5"  (1.651 m)   Wt 68.6 kg   SpO2 90%   BMI 25.17 kg/m   Intake/Output Summary (Last 24 hours) at 01/23/2023 0953 Last data filed at 01/23/2023 0500 Gross per 24 hour  Intake 1257.19 ml  Output 1550 ml  Net -292.81 ml    Gen: awake in bed, NG in place with TF CVS: RRR Resp: occ rhonchi Abd: +BS, soft, NT/ND Ext: no edema  Recent Labs  Lab 01/18/23 1545 01/19/23 0403 01/19/23 2034 01/20/23 0732 01/21/23 0238 01/22/23 0127 01/23/23 0308  NA 135 137 131* 132* 133* 132* 133*  K 5.3* 5.2* 5.2* 4.0 4.2 4.3 4.2  CL 98 95* 95* 93* 97* 97* 96*  CO2 25 24 23 26 27 26 26   GLUCOSE 143* 89 207* 191* 149* 183* 111*  BUN 88* 94* 105* 51* 67* 81* 89*  CREATININE 2.99* 3.02* 3.06* 1.89* 2.26* 2.59* 2.65*  ALBUMIN 1.7* 1.8* 1.8* 1.8* 1.7* 1.7* 1.8*  CALCIUM 7.5* 8.0* 7.4* 7.4* 7.3* 7.3* 7.7*  PHOS 6.8* 7.8* 6.6* 3.8 4.0 4.0 4.3   Liver Function Tests: Recent Labs  Lab 01/21/23 0238 01/22/23 0127 01/23/23 0308  ALBUMIN 1.7* 1.7* 1.8*   No results for input(s): "LIPASE", "AMYLASE" in the last 168 hours. No results for input(s): "AMMONIA" in the last 168 hours. CBC: Recent Labs  Lab 01/19/23 2034 01/20/23 0732 01/21/23 0238 01/22/23 0127 01/23/23 0308  WBC 12.9* 13.7* 9.6 8.7 9.1  NEUTROABS  --   --   --   --  7.8*  HGB 7.3* 8.2* 7.4* 7.4* 7.5*  HCT 23.1* 25.4* 22.9* 23.1* 23.3*  MCV 89.5 87.9 89.8 91.7 92.1  PLT 424* 428* 378 375 355   Cardiac Enzymes: No results for input(s): "CKTOTAL", "CKMB", "CKMBINDEX", "TROPONINI" in the last 168 hours. CBG: Recent Labs  Lab 01/22/23 1524 01/22/23 1945 01/22/23 2303 01/23/23 0307 01/23/23 0819  GLUCAP 228* 252* 218* 102* 186*    Iron Studies:  No results for input(s): "IRON", "TIBC", "TRANSFERRIN", "FERRITIN" in the last 72 hours. Studies/Results: DG CHEST PORT 1 VIEW  Result Date: 01/23/2023 CLINICAL DATA:  Shortness of breath EXAM: PORTABLE CHEST 1 VIEW COMPARISON:  Yesterday FINDINGS: Reticular and airspace opacities bilaterally appear progressed in density. Trace pleural effusions. No pneumothorax. Feeding tube with tip at least reaching the stomach. Right IJ line with tip at the upper cavoatrial junction. Normal heart size. IMPRESSION: Mild progression of generalized airspace disease since yesterday. Electronically Signed   By: Tiburcio Pea M.D.   On: 01/23/2023 07:30   DG CHEST PORT 1 VIEW  Result Date: 01/22/2023 CLINICAL DATA:  Shortness of breath. EXAM: PORTABLE CHEST 1 VIEW COMPARISON:  01/17/2023 FINDINGS: A feeding tube passes into the stomach although the distal tip position is not included on the film. Right IJ central line tip overlies the proximal SVC level. Diffuse bilateral interstitial and airspace disease is similar to prior. Cardiopericardial silhouette is at upper limits of normal for size. Telemetry leads overlie the chest. IMPRESSION: 1. No substantial interval change. 2. Diffuse bilateral interstitial and airspace disease. Electronically Signed   By: Kennith Center M.D.   On: 01/22/2023 07:54  amLODipine  10 mg Per Tube Daily   arformoterol  15 mcg Nebulization BID   aspirin  81 mg Per Tube Daily   budesonide (PULMICORT) nebulizer solution  0.5 mg Nebulization BID   busPIRone  5 mg Per Tube TID   Chlorhexidine Gluconate Cloth  6 each Topical Daily   Chlorhexidine Gluconate Cloth  6 each Topical Q0600   clonazepam  0.5 mg Per Tube BID   docusate  100 mg Per Tube BID   feeding supplement  237 mL Oral BID BM   fluticasone  2 spray Each Nare Daily   insulin aspart  0-20 Units Subcutaneous Q4H   insulin glargine-yfgn  10 Units Subcutaneous BID   lidocaine  1 patch Transdermal Q24H   loratadine  10 mg Per Tube  Q48H   methylPREDNISolone (SOLU-MEDROL) injection  60 mg Intravenous Daily   Followed by   Melene Muller ON 01/25/2023] methylPREDNISolone (SOLU-MEDROL) injection  50 mg Intravenous Daily   Followed by   Melene Muller ON 01/28/2023] predniSONE  40 mg Per Tube Q breakfast   Followed by   Melene Muller ON 01/31/2023] predniSONE  30 mg Per Tube Q breakfast   Followed by   Melene Muller ON 02/03/2023] predniSONE  20 mg Per Tube Q breakfast   Followed by   Melene Muller ON 02/06/2023] predniSONE  10 mg Per Tube Q breakfast   montelukast  10 mg Per Tube QHS   multivitamin  1 tablet Per Tube QHS   mouth rinse  15 mL Mouth Rinse 4 times per day   pantoprazole (PROTONIX) IV  40 mg Intravenous QHS   polyethylene glycol  17 g Per Tube Daily   pramipexole  0.5 mg Per Tube QHS   pravastatin  20 mg Per Tube QPM   revefenacin  175 mcg Nebulization Daily   saccharomyces boulardii  250 mg Per Tube BID   senna  2 tablet Per Tube QHS   traZODone  50 mg Per Tube QHS    BMET    Component Value Date/Time   NA 133 (L) 01/23/2023 0308   K 4.2 01/23/2023 0308   CL 96 (L) 01/23/2023 0308   CO2 26 01/23/2023 0308   GLUCOSE 111 (H) 01/23/2023 0308   BUN 89 (H) 01/23/2023 0308   CREATININE 2.65 (H) 01/23/2023 0308   CALCIUM 7.7 (L) 01/23/2023 0308   GFRNONAA 24 (L) 01/23/2023 0308   GFRAA >60 12/15/2019 0812   CBC    Component Value Date/Time   WBC 9.1 01/23/2023 0308   RBC 2.53 (L) 01/23/2023 0308   HGB 7.5 (L) 01/23/2023 0308   HCT 23.3 (L) 01/23/2023 0308   PLT 355 01/23/2023 0308   MCV 92.1 01/23/2023 0308   MCH 29.6 01/23/2023 0308   MCHC 32.2 01/23/2023 0308   RDW 16.6 (H) 01/23/2023 0308   LYMPHSABS 0.8 01/23/2023 0308   MONOABS 0.6 01/23/2023 0308   EOSABS 0.0 01/23/2023 0308   BASOSABS 0.0 01/23/2023 0308    Assessment/Plan:  AKI - presumably ischemic ATN in setting of sepsis due to pneumonia and poor po intake. Vanco trough was high at 28.  UA with blood and protein but many bacteria and WBC too, low susp for pulm  renal and serologies neg ANCA and antiGBM. BUN/Cr continued to climb.  CRRT initiated after right internal jugular catheter placed by PCCM 7/6 which continued until 7/8 PM.  In setting of worsening azotemia did HD 7/13 for clearance, no UF needed UOP excellent, watching daily labs for now, No HD  needed today,  Cont daily reassessments.  If cont to need HD will need tunneled HD cath placed; re-eval tomorrow. If holding again on HD 7/17 consider temp HD cath removal OK with redosing lasix as clinically indicated.  Volume looks ok for now.  Avoid nephrotoxic medications including NSAIDs and iodinated intravenous contrast exposure unless the latter is absolutely indicated.  Preferred narcotic agents for pain control are hydromorphone, fentanyl, and methadone. Morphine should not be used. Avoid Baclofen and avoid oral sodium phosphate and magnesium citrate based laxatives / bowel preps. Continue strict Input and Output monitoring. Will monitor the patient closely with you and intervene or adjust therapy as indicated by changes in clinical status/labs   AMS - delerium off/on.  Currently ok.  Sepsis - due to recurrent multifocal pneumonia - suspected aspiration.  Improved Acute hypoxic respiratory failure - required intubation now extubated.  Improving. Iron deficiency anemia - continue to follow, Hb 7s, defer transfusion to primary.  Severe protein malnutrition - alb 1.5.  cortrak and supplements per PCCM. Esophageal dysmotility / dysphagia - GI and speech path following.   Arita Miss, MD  [facility]

## 2023-01-23 NOTE — Progress Notes (Signed)
eLink Physician-Brief Progress Note Patient Name: Travis Beck DOB: [birth year] MRN: [id number]   Date of Service  01/23/2023  HPI/Events of Note  Sudden dyspnea this am with ronchorus breath sounds.  Heart burn noted.  I think patient had episode of regurgitation with aspiration.  Improved but not resolved with breathing treatment.   eICU Interventions  Cxr stat bipap     Intervention Category Intermediate Interventions: Respiratory distress - evaluation and management  Henry Russel, P 01/23/2023, 6:57 AM

## 2023-01-23 NOTE — Progress Notes (Signed)
NAME:  Travis Beck, MRN:  [id number], DOB:  [birth year], LOS: 17 ADMISSION DATE:  01/06/2023, CONSULTATION DATE:  01/08/2023 REFERRING MD:  Marland Mcalpine, CHIEF COMPLAINT:  Aspiration Pneumonia, nausea and vomiting   History of Present Illness:  77 y.o. male with medical history significant of IIDM, HTN, SIDAH, Asthma, (Former smoker quit 1994 with a 66 pack year smoking history ) diabetic neuropathy, recurrent pneumonia presented 01/06/2023 with worsening of nauseous vomiting cough and shortness of breath.  Patient was recently hospitalized 5/23-5/26 for multifocal MRSA pneumonia  and septic shock requiring vasopressor on admission-stabilized and subsequently discharged home on Augmentin/doxycycline . Pt returned to [facility] 12/07/2022 with fever of 103.1 and ongoing left sided pleuritic pain. He has a small effusion that was not big enough to tap.  He was admitted for incompletely treated PNA-as sputum cultures finalized postdischarge, and Doxycycline did not cover MRSA. He was treated with Zyvox, and discharged home on Zyvox p.o on 12/13/2022. Initially his breathing symptoms improved with Zyvox however patient continued to experience frequent nausea, vomiting and cough. He presented to the ED 6/29 as these symptoms were worsening.  In the ED he was mildly febrile at 99.6, He had  tachycardia, oxygen sats were 93% on 3 L. CXR again showed a multifocal pneumonia, right sided. Na 128/ Creatinine of 4.2, BUN 66 and WBC of 20. His HGB was 8.7.Respiratory Panel was negative. Lactic Acid trend since admission 2.7>>2.8>>1,3 Pulmonary was  asked to consult for recurrent vs slow to resolve pneumonia and hypoxemia . Of note , patient is followed in the [facility]  Pulmonary clinic by Dr. Isaiah Serge and Rubye Oaks NP.  Per family patient has had about a 15 pound weight loss since the end of May 24 when pneumonia was initially diagnosed.  Pertinent  Medical History  Arthritis, Asthma, HTN, DM type  2  Significant Hospital Events: Including procedures, antibiotic start and stop dates in addition to other pertinent events   5/23-5/26 hospitalization for septic shock from multilobar PNA 5/30 -6/5 fever 103 F at home-ongoing left-sided pleuritic chest pain 6/29 Recurrent pneumonitis 7/01 intubated 7/03 extubated 7/04 complete ABx 7/05 start CRRT, off pressors 7/06 start steroids 7/11 blood back to the ICU for desaturations 7/13-had dialysis 7/15 On RA with sats of 100%, consider dc to [facility] after swallow eval Interim History / Subjective:  Heartburn episode associated with rhonchi and respiratory distress last night- better with neb then bipap. This morning feels better. Planning for EGD today.   Objective   BP (!) 136/58   Pulse 95   Temp 97.8 F (36.6 C) (Axillary)   Resp (!) 28   Ht 5\' 5"  (1.651 m)   Wt 68.6 kg   SpO2 90%   BMI 25.17 kg/m .   Examination: General -  chronically ill appearing man lying in bed in NAD HEENT-  [place]/AT, eyes anicteric Cardiac - S1S2, reg rate, irreg rhythm Chest - no rhonchi, scattered rhales, breathing comfortably on RA after coming off bipap abdomen -  soft, NT Extremities -  no cyanosis, mild pedal edema Skin: warm, dry Neuro - awake, alert, globally weak but moving all extremities    Na+ 133 BUN 89 Cr 2.65 WBC 9.1 H/H 7.5/23.3 Platelets 355 CXR personally reviewed> ongoing bilateral infiltrates. Worse LUL infiltrate.  Resolved Hospital Problem list     Assessment & Plan:   Acute hypoxemic respiratory failure with multifocal pneumonitis related to aspiration, previous pneumonia Possible TRALI after transfusion on 7/10 -BiPAP at bedtime PRN -  tapering steroids -con't bronchodilators -needs EGD to determine if there is a way to fix his reflux to protect his lungs -pulmonary hygiene  Acute kidney injury Uremia; required HD for metabolic clearance on 7/13, but did not require volume removal -strict IO -maintain adequate  perfusion -avoid nephrotoxic meds  Acute metabolic encephalopathy secondary to hypoxia and renal failure>> Improving Chronic pain Depression/anxiety -con't clonazepam and buspar -xanax PRN  History of asthma -con't inhalers -PPI  Esophageal dysmotility, reflux -EGD today -PPI -appreciate GI's management -NPO until more swallow eval done; con't supporting with TF  Severe protein calorie malnutrition Pathologic weight loss -TF - EGD today; NPO until after  Iron deficiency anemia -transfuse for Hb <7 or hemodynamically significant bleeding  Type 2 diabetes -glargine 10 units BID  -SSI PRN -with TF on hold will avoid increasing insulin now  History of hyperlipidemia -con't pravastatin  RLS -con't mirapex  Sisters updated at bedside today.  IF he is stable after EGD today he can discharge to [facility].   Best Practice (right click and "Reselect all SmartList Selections" daily)   Diet/type: tube feeds DVT prophylaxis: SQ heparin GI prophylaxis: protonix Lines: Rt internal jugular HD catheter Foley:  Yes, still needed Code Status:  full code Last date of multidisciplinary goals of care discussion (updated family at bedside 7/11)  Labs       Latest Ref Rng & Units 01/23/2023    3:08 AM 01/22/2023    1:27 AM 01/21/2023    2:38 AM  CMP  Glucose 70 - 99 mg/dL 409  811  914   BUN 8 - 23 mg/dL 89  81  67   Creatinine 0.61 - 1.24 mg/dL 7.82  9.56  2.13   Sodium 135 - 145 mmol/L 133  132  133   Potassium 3.5 - 5.1 mmol/L 4.2  4.3  4.2   Chloride 98 - 111 mmol/L 96  97  97   CO2 22 - 32 mmol/L 26  26  27    Calcium 8.9 - 10.3 mg/dL 7.7  7.3  7.3       Latest Ref Rng & Units 01/23/2023    3:08 AM 01/22/2023    1:27 AM 01/21/2023    2:38 AM  CBC  WBC 4.0 - 10.5 K/uL 9.1  8.7  9.6   Hemoglobin 13.0 - 17.0 g/dL 7.5  7.4  7.4   Hematocrit 39.0 - 52.0 % 23.3  23.1  22.9   Platelets 150 - 400 K/uL 355  375  378     ABG    Component Value Date/Time   PHART 7.213 (L)  01/09/2023 0009   PCO2ART 52.9 (H) 01/09/2023 0009   PO2ART 170 (H) 01/09/2023 0009   HCO3 28.9 (H) 01/16/2023 0750   TCO2 23 01/09/2023 0009   ACIDBASEDEF 2.3 (H) 01/13/2023 0928   O2SAT 91.7 01/16/2023 0750    CBG (last 3)  Recent Labs    01/22/23 2303 01/23/23 0307 01/23/23 0819  GLUCAP 218* 102* 186*    [address] [place], DO 01/23/23 9:47 AM [facility]  For contact information, see Amion. If no response to pager, please call PCCM consult pager. After hours, 7PM- 7AM, please call Elink.

## 2023-01-23 NOTE — Progress Notes (Signed)
Occupational Therapy Treatment Patient Details Name: Travis Beck MRN: [id number] DOB: May 13, 1946 Today's Date: 01/23/2023   History of present illness 77 yo male admitted 6/29 with SOB, Rt flank and back pain with multilobar PNA. 7/1 respiratory distress requiring intubation. 7/3 extubated. AKI progressing to require CRRT 7/5. PMhx: COPD, T2DM, HTN, SIADH, neuropathy, chronic back pain, anxiety   OT comments  Pt demonstrating fair progress toward therapy goals. Pt initially hesitant to participating in therapy secondary to fatigue, agreeable to EOB/OOB activity with encouragement. Pt presents this session with generalized weakness, decreased balance, and lethargic secondary to anxiety medication. Session focused on functional transfers with pt completing bed mobility, STS transfer and side steps toward [facility] with good cardiopulmonary tolerance. Pt would benefit from continued acute OT services to maximize functional independence and facilitate transition to intensive inpatient follow up therapy, >3 hours/day after discharge.  Session vitals:  SpO2 on RA at rest: 93%, SpO2 with activity on 2L : >93%, SpO2 post activity on RA: 92%     Recommendations for follow up therapy are one component of a multi-disciplinary discharge planning process, led by the attending physician.  Recommendations may be updated based on patient status, additional functional criteria and insurance authorization.    Assistance Recommended at Discharge Frequent or constant Supervision/Assistance  Patient can return home with the following  A little help with walking and/or transfers;A little help with bathing/dressing/bathroom;Assistance with cooking/housework;Direct supervision/assist for medications management;Direct supervision/assist for financial management;Assist for transportation;Help with stairs or ramp for entrance   Equipment Recommendations  Other (comment) (defer)    Recommendations for Other Services       Precautions / Restrictions Precautions Precautions: Fall;Other (comment) Precaution Comments: watch O2 Restrictions Weight Bearing Restrictions: No       Mobility Bed Mobility Overal bed mobility: Needs Assistance Bed Mobility: Supine to Sit, Sit to Supine     Supine to sit: Min assist, HOB elevated Sit to supine: HOB elevated, Min guard   General bed mobility comments: assist at trunk    Transfers Overall transfer level: Needs assistance Equipment used: Rolling walker (2 wheels) Transfers: Sit to/from Stand Sit to Stand: Min guard           General transfer comment: STS transfer from EOB using RW with min guard A. Side steps toward HOB using RW with min A and RW management     Balance Overall balance assessment: Needs assistance Sitting-balance support: No upper extremity supported, Feet supported Sitting balance-Leahy Scale: Fair Sitting balance - Comments: sitting EOB   Standing balance support: Bilateral upper extremity supported, During functional activity, Reliant on assistive device for balance Standing balance-Leahy Scale: Poor Standing balance comment: BUE support on RW for stability                           ADL either performed or assessed with clinical judgement   ADL Overall ADL's : Needs assistance/impaired     Grooming: Wash/dry face;Min guard;Sitting Grooming Details (indicate cue type and reason): max cues for initiation                 Toilet Transfer: Stand-pivot;BSC/3in1;Rolling walker (2 wheels);Minimal assistance Toilet Transfer Details (indicate cue type and reason): simulated         Functional mobility during ADLs: Minimal assistance;Rolling walker (2 wheels) General ADL Comments: limited secondary to generalized weakness, balance and fatigue    Extremity/Trunk Assessment Upper Extremity Assessment Upper Extremity Assessment: Generalized weakness   Lower  Extremity Assessment Lower Extremity Assessment:  Defer to PT evaluation        Vision   Vision Assessment?: No apparent visual deficits   Perception Perception Perception: Not tested   Praxis Praxis Praxis: Not tested    Cognition Arousal/Alertness: Lethargic Behavior During Therapy: Flat affect Overall Cognitive Status: Impaired/Different from baseline Area of Impairment: Memory, Safety/judgement, Awareness, Following commands, Attention                   Current Attention Level: Sustained Memory: Decreased short-term memory, Decreased recall of precautions Following Commands: Follows one step commands with increased time Safety/Judgement: Decreased awareness of safety, Decreased awareness of deficits Awareness: Emergent   General Comments: Pt lethargic this session secondary to anxiety medications. Pt required max cues and encouragement for engagement/initiation this session. Benefits from simple 1-step commands.        Exercises      Shoulder Instructions       General Comments SpO2 on RA at rest: 93%, SpO2 with activity on 2L [place]: >93%, SpO2 post activity on RA: 92%; family supportive at bedside    Pertinent Vitals/ Pain       Pain Assessment Pain Assessment: Faces Faces Pain Scale: Hurts a little bit Pain Location: back Pain Descriptors / Indicators: Discomfort, Guarding Pain Intervention(s): Monitored during session, Limited activity within patient's tolerance  Home Living                                          Prior Functioning/Environment              Frequency  Min 2X/week        Progress Toward Goals  OT Goals(current goals can now be found in the care plan section)  Progress towards OT goals: Progressing toward goals  Acute Rehab OT Goals Patient Stated Goal: to rest OT Goal Formulation: With patient Time For Goal Achievement: 01/30/23 Potential to Achieve Goals: Good ADL Goals Pt Will Perform Grooming: with modified independence;standing Pt Will  Perform Lower Body Dressing: with modified independence;sit to/from stand Pt Will Transfer to Toilet: with modified independence;ambulating;regular height toilet Additional ADL Goal #1: Pt will complete 2 step ADL task with min cues  Plan Frequency remains appropriate;Discharge plan remains appropriate    Co-evaluation    PT/OT/SLP Co-Evaluation/Treatment: Yes Reason for Co-Treatment: Complexity of the patient's impairments (multi-system involvement);For patient/therapist safety;To address functional/ADL transfers   OT goals addressed during session: ADL's and self-care      AM-PAC OT "6 Clicks" Daily Activity     Outcome Measure   Help from another person eating meals?: A Little Help from another person taking care of personal grooming?: A Little Help from another person toileting, which includes using toliet, bedpan, or urinal?: A Little Help from another person bathing (including washing, rinsing, drying)?: A Little Help from another person to put on and taking off regular upper body clothing?: A Little Help from another person to put on and taking off regular lower body clothing?: A Little 6 Click Score: 18    End of Session Equipment Utilized During Treatment: Rolling walker (2 wheels);Oxygen  OT Visit Diagnosis: Unsteadiness on feet (R26.81);Muscle weakness (generalized) (M62.81);Other symptoms and signs involving cognitive function   Activity Tolerance Patient limited by lethargy   Patient Left in bed;with call bell/phone within reach;with bed alarm set;with family/visitor present   Nurse Communication Mobility status  Time: 9629-5284 OT Time Calculation (min): 30 min  Charges: OT General Charges $OT Visit: 1 Visit OT Treatments $Therapeutic Activity: 8-22 mins  Sherley Bounds, OTS Acute Rehabilitation Services Office [phone] Secure Chat Communication Preferred   Sherley Bounds 01/23/2023, 3:18 PM

## 2023-01-23 NOTE — Progress Notes (Addendum)
1610: Pt complains of anxiety and feeling restless. Pt given PRN xanax. Pt talking with family member about upcoming procedure today prior to feeling anxious.   0645: Pt still feeling anxious with increased WOB. Complained of heart burn. PRN breathing treatment given. Pt with bilateral rhonchi breath sounds.  [id number] Corn MD contacted about increased WOB and new oxygen. New stat orders.  Pt coughed up large brown/reddish sputum. Pt now with clear breath sounds on right still with rhonchi on left. Pt still feeling anxious and stating "I can't breath." RT on way with Bipap per MD order

## 2023-01-23 NOTE — Progress Notes (Signed)
Physical Therapy Treatment Patient Details Name: Travis Beck MRN: [id number] DOB: 1945/07/17 Today's Date: 01/23/2023   History of Present Illness 77 yo male admitted 6/29 with SOB, Rt flank and back pain with multilobar PNA. 7/1 respiratory distress requiring intubation. 7/3 extubated. AKI progressing to require CRRT 7/5. PMhx: COPD, T2DM, HTN, SIADH, neuropathy, chronic back pain, anxiety    PT Comments  Pt greeted resting in bed, limited this session by increased fatigue and lethargy, suspect due to anti-anxiety medications. Despite fatigue pt able to come to sitting EOB with min A to elevate trunk as pt reaching for HHA. With encouragement pt able to come to stand with min guard with RW for support and side-step along EOB to [facility] with assist to manage RW. Pt declining further gait attempts and/or exercise this session due to fatigue. Pt continues to be limited by weakness, anxiety, and decreased cardiopulmonary endurance and continues to benefit from skilled PT services to progress toward functional mobility goals.      Assistance Recommended at Discharge Intermittent Supervision/Assistance  If plan is discharge home, recommend the following:  Can travel by private vehicle    A little help with walking and/or transfers;A little help with bathing/dressing/bathroom;Assistance with cooking/housework;Assist for transportation      Equipment Recommendations  None recommended by PT    Recommendations for Other Services       Precautions / Restrictions Precautions Precautions: Fall;Other (comment) Precaution Comments: watch O2 Restrictions Weight Bearing Restrictions: No     Mobility  Bed Mobility Overal bed mobility: Needs Assistance Bed Mobility: Supine to Sit, Sit to Supine     Supine to sit: Min assist, HOB elevated Sit to supine: HOB elevated, Min guard   General bed mobility comments: assist at trunk    Transfers Overall transfer level: Needs  assistance Equipment used: Rolling walker (2 wheels) Transfers: Sit to/from Stand Sit to Stand: Min guard           General transfer comment: able to stand from EOB using RW with min guard A. Side steps toward HOB using RW with min A and RW management    Ambulation/Gait               General Gait Details: deferred this session due to increased lethargy from anti-anxiety meds   Stairs             Wheelchair Mobility     Tilt Bed    Modified Rankin (Stroke Patients Only)       Balance Overall balance assessment: Needs assistance Sitting-balance support: No upper extremity supported, Feet supported Sitting balance-Leahy Scale: Fair Sitting balance - Comments: sitting EOB   Standing balance support: Bilateral upper extremity supported, During functional activity, Reliant on assistive device for balance Standing balance-Leahy Scale: Poor Standing balance comment: BUE support on RW for stability                            Cognition Arousal/Alertness: Lethargic Behavior During Therapy: Flat affect Overall Cognitive Status: Impaired/Different from baseline Area of Impairment: Memory, Safety/judgement, Awareness, Following commands, Attention                   Current Attention Level: Sustained Memory: Decreased short-term memory, Decreased recall of precautions Following Commands: Follows one step commands with increased time Safety/Judgement: Decreased awareness of safety, Decreased awareness of deficits Awareness: Emergent   General Comments: Pt lethargic this session secondary to anxiety medications. Pt required max  cues and encouragement for engagement/initiation this session. Benefits from simple 1-step commands.        Exercises      General Comments General comments (skin integrity, edema, etc.): SpO2 on RA at rest: 93%, SpO2 with activity on 2L [place]: >93%, SpO2 post activity on RA: 92%`      Pertinent Vitals/Pain Pain  Assessment Pain Assessment: Faces Faces Pain Scale: Hurts a little bit Pain Location: back Pain Descriptors / Indicators: Discomfort, Guarding Pain Intervention(s): Monitored during session, Limited activity within patient's tolerance    Home Living                          Prior Function            PT Goals (current goals can now be found in the care plan section) Acute Rehab PT Goals PT Goal Formulation: With patient/family Time For Goal Achievement: 01/29/23 Progress towards PT goals: Progressing toward goals    Frequency    Min 3X/week      PT Plan      Co-evaluation PT/OT/SLP Co-Evaluation/Treatment: Yes Reason for Co-Treatment: Complexity of the patient's impairments (multi-system involvement);For patient/therapist safety;To address functional/ADL transfers PT goals addressed during session: Balance;Mobility/safety with mobility;Proper use of DME OT goals addressed during session: ADL's and self-care      AM-PAC PT "6 Clicks" Mobility   Outcome Measure  Help needed turning from your back to your side while in a flat bed without using bedrails?: A Little Help needed moving from lying on your back to sitting on the side of a flat bed without using bedrails?: A Little Help needed moving to and from a bed to a chair (including a wheelchair)?: A Little Help needed standing up from a chair using your arms (e.g., wheelchair or bedside chair)?: A Little Help needed to walk in hospital room?: A Little Help needed climbing 3-5 steps with a railing? : Total 6 Click Score: 16    End of Session Equipment Utilized During Treatment: Oxygen Activity Tolerance: Patient tolerated treatment well;Patient limited by fatigue Patient left: in bed;with call bell/phone within reach;with family/visitor present Nurse Communication: Mobility status PT Visit Diagnosis: Other abnormalities of gait and mobility (R26.89);Muscle weakness (generalized) (M62.81)     Time:  1191-4782 PT Time Calculation (min) (ACUTE ONLY): 29 min  Charges:    $Therapeutic Activity: 8-22 mins PT General Charges $$ ACUTE PT VISIT: 1 Visit                     Hashir Deleeuw R. PTA Acute Rehabilitation Services Office: [phone]   Catalina Antigua 01/23/2023, 4:01 PM

## 2023-01-23 NOTE — H&P (View-Only) (Signed)
[place] [facility] ROUNDING NOTE   Subjective: Patient had an episode of respiratory distress, ?aspiration event last night, was placed on BiPAP with improvement of respiratory status.  He is somnolent but arousable   Objective: Vital signs in last 24 hours: Temp:  [97.2 F (36.2 C)-97.8 F (36.6 C)] 97.5 F (36.4 C) (07/16 1141) Pulse Rate:  [56-107] 97 (07/16 1400) Resp:  [18-30] 18 (07/16 1400) BP: (127-150)/(58-100) 130/79 (07/16 1300) SpO2:  [89 %-100 %] 94 % (07/16 1400) FiO2 (%):  [40 %] 40 % (07/16 0828) Weight:  [68.6 kg] 68.6 kg (07/16 0457) Last BM Date : 01/22/23 General: NAD Lungs: Left side rhonchi with decreased breath sounds Heart: S1-S2 Abdomen: Soft distended, nontender     Intake/Output from previous day: 07/15 0701 - 07/16 0700 In: 1307.2 [P.O.:220; I.V.:14.4; NG/GT:1030; IV Piggyback:42.8] Out: 1550 [Urine:1550] Intake/Output this shift: Total I/O In: -  Out: 600 [Urine:600]   Lab Results: Recent Labs    01/21/23 0238 01/22/23 0127 01/23/23 0308  WBC 9.6 8.7 9.1  HGB 7.4* 7.4* 7.5*  PLT 378 375 355  MCV 89.8 91.7 92.1   BMET Recent Labs    01/21/23 0238 01/22/23 0127 01/23/23 0308  NA 133* 132* 133*  K 4.2 4.3 4.2  CL 97* 97* 96*  CO2 27 26 26   GLUCOSE 149* 183* 111*  BUN 67* 81* 89*  CREATININE 2.26* 2.59* 2.65*  CALCIUM 7.3* 7.3* 7.7*   LFT Recent Labs    01/21/23 0238 01/22/23 0127 01/23/23 0308  ALBUMIN 1.7* 1.7* 1.8*   PT/INR No results for input(s): "INR" in the last 72 hours.    Imaging/Other results: DG CHEST PORT 1 VIEW  Result Date: 01/23/2023 CLINICAL DATA:  Shortness of breath EXAM: PORTABLE CHEST 1 VIEW COMPARISON:  Yesterday FINDINGS: Reticular and airspace opacities bilaterally appear progressed in density. Trace pleural effusions. No pneumothorax. Feeding tube with tip at least reaching the stomach. Right IJ line with tip at the upper cavoatrial junction. Normal heart size. IMPRESSION: Mild  progression of generalized airspace disease since yesterday. Electronically Signed   By: Tiburcio Pea M.D.   On: 01/23/2023 07:30   DG CHEST PORT 1 VIEW  Result Date: 01/22/2023 CLINICAL DATA:  Shortness of breath. EXAM: PORTABLE CHEST 1 VIEW COMPARISON:  01/17/2023 FINDINGS: A feeding tube passes into the stomach although the distal tip position is not included on the film. Right IJ central line tip overlies the proximal SVC level. Diffuse bilateral interstitial and airspace disease is similar to prior. Cardiopericardial silhouette is at upper limits of normal for size. Telemetry leads overlie the chest. IMPRESSION: 1. No substantial interval change. 2. Diffuse bilateral interstitial and airspace disease. Electronically Signed   By: Kennith Center M.D.   On: 01/22/2023 07:54      Assessment &Plan  77 year old very pleasant gentleman with recurrent aspiration pneumonia, respiratory failure requiring intubation during this admission s/p extubation, hospital course complicated by AKI, on dialysis Respiratory status is tenuous, had an aspiration event overnight which improved with BiPAP Will defer EGD until tomorrow Hold tube feeds at midnight, n.p.o. at midnight Continue antireflux measures    K. Scherry Ran , MD [phone]  [facility]

## 2023-01-23 NOTE — Progress Notes (Signed)
[place] [facility]OENTEROLOGY ROUNDING NOTE   Subjective: Patient had an episode of respiratory distress, ?aspiration event last night, was placed on BiPAP with improvement of respiratory status.  He is somnolent but arousable   Objective: Vital signs in last 24 hours: Temp:  [97.2 F (36.2 C)-97.8 F (36.6 C)] 97.5 F (36.4 C) (07/16 1141) Pulse Rate:  [56-107] 97 (07/16 1400) Resp:  [18-30] 18 (07/16 1400) BP: (127-150)/(58-100) 130/79 (07/16 1300) SpO2:  [89 %-100 %] 94 % (07/16 1400) FiO2 (%):  [40 %] 40 % (07/16 0828) Weight:  [68.6 kg] 68.6 kg (07/16 0457) Last BM Date : 01/22/23 General: NAD Lungs: Left side rhonchi with decreased breath sounds Heart: S1-S2 Abdomen: Soft distended, nontender     Intake/Output from previous day: 07/15 0701 - 07/16 0700 In: 1307.2 [P.O.:220; I.V.:14.4; NG/GT:1030; IV Piggyback:42.8] Out: 1550 [Urine:1550] Intake/Output this shift: Total I/O In: -  Out: 600 [Urine:600]   Lab Results: Recent Labs    01/21/23 0238 01/22/23 0127 01/23/23 0308  WBC 9.6 8.7 9.1  HGB 7.4* 7.4* 7.5*  PLT 378 375 355  MCV 89.8 91.7 92.1   BMET Recent Labs    01/21/23 0238 01/22/23 0127 01/23/23 0308  NA 133* 132* 133*  K 4.2 4.3 4.2  CL 97* 97* 96*  CO2 27 26 26   GLUCOSE 149* 183* 111*  BUN 67* 81* 89*  CREATININE 2.26* 2.59* 2.65*  CALCIUM 7.3* 7.3* 7.7*   LFT Recent Labs    01/21/23 0238 01/22/23 0127 01/23/23 0308  ALBUMIN 1.7* 1.7* 1.8*   PT/INR No results for input(s): "INR" in the last 72 hours.    Imaging/Other results: DG CHEST PORT 1 VIEW  Result Date: 01/23/2023 CLINICAL DATA:  Shortness of breath EXAM: PORTABLE CHEST 1 VIEW COMPARISON:  Yesterday FINDINGS: Reticular and airspace opacities bilaterally appear progressed in density. Trace pleural effusions. No pneumothorax. Feeding tube with tip at least reaching the stomach. Right IJ line with tip at the upper cavoatrial junction. Normal heart size. IMPRESSION: Mild  progression of generalized airspace disease since yesterday. Electronically Signed   By: Tiburcio Pea M.D.   On: 01/23/2023 07:30   DG CHEST PORT 1 VIEW  Result Date: 01/22/2023 CLINICAL DATA:  Shortness of breath. EXAM: PORTABLE CHEST 1 VIEW COMPARISON:  01/17/2023 FINDINGS: A feeding tube passes into the stomach although the distal tip position is not included on the film. Right IJ central line tip overlies the proximal SVC level. Diffuse bilateral interstitial and airspace disease is similar to prior. Cardiopericardial silhouette is at upper limits of normal for size. Telemetry leads overlie the chest. IMPRESSION: 1. No substantial interval change. 2. Diffuse bilateral interstitial and airspace disease. Electronically Signed   By: Kennith Center M.D.   On: 01/22/2023 07:54      Assessment &Plan  77 year old very pleasant gentleman with recurrent aspiration pneumonia, respiratory failure requiring intubation during this admission s/p extubation, hospital course complicated by AKI, on dialysis Respiratory status is tenuous, had an aspiration event overnight which improved with BiPAP Will defer EGD until tomorrow Hold tube feeds at midnight, n.p.o. at midnight Continue antireflux measures    K. Scherry Ran , MD [phone]  [facility]

## 2023-01-23 NOTE — Progress Notes (Signed)
SLP Cancellation Note  Patient Details Name: Travis Beck MRN: [id number] DOB: [birth year]   Cancelled treatment:       Reason Eval/Treat Not Completed: Patient at procedure or test/unavailable Patient NPO for EGD today. SLP will continue to follow.  Angela Nevin, MA, CCC-SLP Speech Therapy

## 2023-01-23 NOTE — Progress Notes (Signed)
eLink Physician-Brief Progress Note Patient Name: Travis Beck DOB: [birth year] MRN: [id number]   Date of Service  01/23/2023  HPI/Events of Note  Chart reviewed and discussed with staff.  eICU Interventions  Transfer to stepdown unit     Intervention Category Intermediate Interventions: Other:  Henry Russel, P 01/23/2023, 1:19 AM

## 2023-01-24 ENCOUNTER — Inpatient Hospital Stay (HOSPITAL_COMMUNITY): Payer: Medicare Other | Admitting: Certified Registered Nurse Anesthetist

## 2023-01-24 ENCOUNTER — Encounter (HOSPITAL_COMMUNITY)
Admission: EM | Disposition: A | Discharge status: Nursing Home (Includes ICF) | Payer: Self-pay | Source: Home / Self Care | Attending: Pulmonary Disease

## 2023-01-24 ENCOUNTER — Encounter (HOSPITAL_COMMUNITY): Payer: Self-pay | Admitting: Internal Medicine

## 2023-01-24 DIAGNOSIS — J449 Chronic obstructive pulmonary disease, unspecified: Secondary | ICD-10-CM

## 2023-01-24 DIAGNOSIS — R1310 Dysphagia, unspecified: Secondary | ICD-10-CM

## 2023-01-24 DIAGNOSIS — B3781 Candidal esophagitis: Secondary | ICD-10-CM

## 2023-01-24 DIAGNOSIS — E440 Moderate protein-calorie malnutrition: Secondary | ICD-10-CM | POA: Diagnosis not present

## 2023-01-24 DIAGNOSIS — J690 Pneumonitis due to inhalation of food and vomit: Secondary | ICD-10-CM | POA: Diagnosis not present

## 2023-01-24 DIAGNOSIS — I10 Essential (primary) hypertension: Secondary | ICD-10-CM

## 2023-01-24 DIAGNOSIS — Z87891 Personal history of nicotine dependence: Secondary | ICD-10-CM | POA: Diagnosis not present

## 2023-01-24 DIAGNOSIS — R1319 Other dysphagia: Secondary | ICD-10-CM | POA: Diagnosis not present

## 2023-01-24 DIAGNOSIS — J9601 Acute respiratory failure with hypoxia: Secondary | ICD-10-CM | POA: Diagnosis not present

## 2023-01-24 HISTORY — PX: BALLOON DILATION: SHX5330

## 2023-01-24 HISTORY — PX: BIOPSY: SHX5522

## 2023-01-24 HISTORY — PX: ESOPHAGOGASTRODUODENOSCOPY (EGD) WITH PROPOFOL: SHX5813

## 2023-01-24 LAB — GLUCOSE, CAPILLARY
Glucose-Capillary: 196 mg/dL — ABNORMAL HIGH (ref 70–99)
Glucose-Capillary: 395 mg/dL — ABNORMAL HIGH (ref 70–99)
Glucose-Capillary: 463 mg/dL — ABNORMAL HIGH (ref 70–99)
Glucose-Capillary: 481 mg/dL — ABNORMAL HIGH (ref 70–99)
Glucose-Capillary: 62 mg/dL — ABNORMAL LOW (ref 70–99)
Glucose-Capillary: 65 mg/dL — ABNORMAL LOW (ref 70–99)
Glucose-Capillary: 74 mg/dL (ref 70–99)
Glucose-Capillary: 92 mg/dL (ref 70–99)

## 2023-01-24 LAB — RENAL FUNCTION PANEL
Albumin: 1.9 g/dL — ABNORMAL LOW (ref 3.5–5.0)
Anion gap: 10 (ref 5–15)
BUN: 92 mg/dL — ABNORMAL HIGH (ref 8–23)
CO2: 25 mmol/L (ref 22–32)
Calcium: 7.8 mg/dL — ABNORMAL LOW (ref 8.9–10.3)
Chloride: 99 mmol/L (ref 98–111)
Creatinine, Ser: 2.76 mg/dL — ABNORMAL HIGH (ref 0.61–1.24)
GFR, Estimated: 23 mL/min — ABNORMAL LOW (ref >60)
Glucose, Bld: 83 mg/dL (ref 70–99)
Phosphorus: 4.9 mg/dL — ABNORMAL HIGH (ref 2.5–4.6)
Potassium: 3.7 mmol/L (ref 3.5–5.1)
Sodium: 134 mmol/L — ABNORMAL LOW (ref 135–145)

## 2023-01-24 LAB — MAGNESIUM: Magnesium: 2.2 mg/dL (ref 1.7–2.4)

## 2023-01-24 LAB — HEMOGLOBIN AND HEMATOCRIT, BLOOD
HCT: 25.1 % — ABNORMAL LOW (ref 39.0–52.0)
Hemoglobin: 8.2 g/dL — ABNORMAL LOW (ref 13.0–17.0)

## 2023-01-24 LAB — GLUCOSE, RANDOM: Glucose, Bld: 493 mg/dL — ABNORMAL HIGH (ref 70–99)

## 2023-01-24 SURGERY — ESOPHAGOGASTRODUODENOSCOPY (EGD) WITH PROPOFOL
Anesthesia: Monitor Anesthesia Care

## 2023-01-24 MED ORDER — LIDOCAINE 2% (20 MG/ML) 5 ML SYRINGE
INTRAMUSCULAR | Status: DC | PRN
  Administered 2023-01-24: 60 mg via INTRAVENOUS

## 2023-01-24 MED ORDER — ONDANSETRON HCL 4 MG/2ML IJ SOLN
4.0000 mg | Freq: Four times a day (QID) | INTRAMUSCULAR | Status: DC | PRN
  Administered 2023-01-24: 4 mg via INTRAVENOUS
  Filled 2023-01-24: qty 2

## 2023-01-24 MED ORDER — DEXTROSE 5 % IV SOLN: INTRAVENOUS | Status: AC

## 2023-01-24 MED ORDER — PROPOFOL 10 MG/ML IV BOLUS
INTRAVENOUS | Status: DC | PRN
  Administered 2023-01-24: 25 mg via INTRAVENOUS
  Administered 2023-01-24: 15 mg via INTRAVENOUS

## 2023-01-24 MED ORDER — FLUCONAZOLE 40 MG/ML PO SUSR
100.0000 mg | Freq: Every day | ORAL | Status: DC
  Filled 2023-01-24: qty 2.5

## 2023-01-24 MED ORDER — FLUCONAZOLE 40 MG/ML PO SUSR
100.0000 mg | Freq: Every day | ORAL | Status: DC
  Administered 2023-01-24: 100 mg via ORAL
  Filled 2023-01-24: qty 2.5

## 2023-01-24 MED ORDER — DEXTROSE 50 % IV SOLN: 0.0000 mL | INTRAVENOUS | Status: DC | PRN

## 2023-01-24 MED ORDER — SODIUM CHLORIDE 0.9 % IV SOLN: INTRAVENOUS | Status: DC

## 2023-01-24 MED ORDER — INSULIN REGULAR(HUMAN) IN NACL 100-0.9 UT/100ML-% IV SOLN
INTRAVENOUS | Status: AC
  Administered 2023-01-25: 11 [IU]/h via INTRAVENOUS
  Filled 2023-01-24: qty 100

## 2023-01-24 MED ORDER — DEXTROSE 50 % IV SOLN
INTRAVENOUS | Status: AC
  Administered 2023-01-24: 25 mL via INTRAVENOUS
  Filled 2023-01-24: qty 50

## 2023-01-24 MED ORDER — DEXTROSE 50 % IV SOLN: 25.0000 mL | Freq: Once | INTRAVENOUS | Status: AC

## 2023-01-24 MED ORDER — PROPOFOL 500 MG/50ML IV EMUL
INTRAVENOUS | Status: DC | PRN
  Administered 2023-01-24: 100 ug/kg/min via INTRAVENOUS

## 2023-01-24 MED ORDER — GABAPENTIN 100 MG PO CAPS
100.0000 mg | ORAL_CAPSULE | Freq: Three times a day (TID) | ORAL | Status: DC | PRN
  Administered 2023-01-24: 100 mg via ORAL
  Filled 2023-01-24: qty 1

## 2023-01-24 MED ORDER — FUROSEMIDE 10 MG/ML IJ SOLN
100.0000 mg | Freq: Once | INTRAVENOUS | Status: AC
  Administered 2023-01-24: 100 mg via INTRAVENOUS
  Filled 2023-01-24: qty 10

## 2023-01-24 SURGICAL SUPPLY — 15 items

## 2023-01-24 NOTE — Plan of Care (Signed)
  Problem: Education: Goal: Ability to describe self-care measures that may prevent or decrease complications (Diabetes Survival Skills Education) will improve Outcome: Progressing Goal: Individualized Educational Video(s) Outcome: Progressing   Problem: Coping: Goal: Ability to adjust to condition or change in health will improve Outcome: Progressing   Problem: Fluid Volume: Goal: Ability to maintain a balanced intake and output will improve Outcome: Progressing   Problem: Health Behavior/Discharge Planning: Goal: Ability to identify and utilize available resources and services will improve Outcome: Progressing Goal: Ability to manage health-related needs will improve Outcome: Progressing   Problem: Metabolic: Goal: Ability to maintain appropriate glucose levels will improve Outcome: Progressing   Problem: Nutritional: Goal: Maintenance of adequate nutrition will improve Outcome: Progressing Goal: Progress toward achieving an optimal weight will improve Outcome: Progressing   Problem: Skin Integrity: Goal: Risk for impaired skin integrity will decrease Outcome: Progressing   Problem: Tissue Perfusion: Goal: Adequacy of tissue perfusion will improve Outcome: Progressing   Problem: Education: Goal: Knowledge of General Education information will improve Description: Including pain rating scale, medication(s)/side effects and non-pharmacologic comfort measures Outcome: Progressing   Problem: Health Behavior/Discharge Planning: Goal: Ability to manage health-related needs will improve Outcome: Progressing   Problem: Clinical Measurements: Goal: Ability to maintain clinical measurements within normal limits will improve Outcome: Progressing Goal: Will remain free from infection Outcome: Progressing Goal: Diagnostic test results will improve Outcome: Progressing Goal: Respiratory complications will improve Outcome: Progressing Goal: Cardiovascular complication will  be avoided Outcome: Progressing   Problem: Activity: Goal: Risk for activity intolerance will decrease Outcome: Progressing   Problem: Nutrition: Goal: Adequate nutrition will be maintained Outcome: Progressing   Problem: Coping: Goal: Level of anxiety will decrease Outcome: Progressing   Problem: Elimination: Goal: Will not experience complications related to bowel motility Outcome: Progressing Goal: Will not experience complications related to urinary retention Outcome: Progressing   Problem: Pain Managment: Goal: General experience of comfort will improve Outcome: Progressing   Problem: Safety: Goal: Ability to remain free from injury will improve Outcome: Progressing   Problem: Skin Integrity: Goal: Risk for impaired skin integrity will decrease Outcome: Progressing   Problem: Safety: Goal: Non-violent Restraint(s) Outcome: Progressing   

## 2023-01-24 NOTE — Progress Notes (Signed)
eLink Physician-Brief Progress Note Patient Name: Travis Beck DOB: 04-02-1946 MRN: [id number]   Date of Service  01/24/2023  HPI/Events of Note  Blood sugar 493 mg / dl.  eICU Interventions  Insulin gtt ordered.        Thomasene Lot [name] 01/24/2023, 11:19 PM

## 2023-01-24 NOTE — Op Note (Addendum)
[facility] Patient Name: Travis Beck Procedure Date : 01/24/2023 MRN: [id number] Attending MD: Napoleon Form , MD, [id number] Date of Birth: 24-May-1946 CSN: [id number] Age: 77 Admit Type: Inpatient Procedure:                Upper GI endoscopy Indications:              Dysphagia, Abnormal modified barium swallow                            suggestive of distal esophagela stricture Providers:                Napoleon Form, MD, Dartha Lodge, RN,                            Faustina Mbumina, Technician Referring MD:              Medicines:                Monitored Anesthesia Care Complications:            No immediate complications. Estimated Blood Loss:     Estimated blood loss was minimal. Procedure:                Pre-Anesthesia Assessment:                           - Prior to the procedure, a History and Physical                            was performed, and patient medications and                            allergies were reviewed. The patient's tolerance of                            previous anesthesia was also reviewed. The risks                            and benefits of the procedure and the sedation                            options and risks were discussed with the patient.                            All questions were answered, and informed consent                            was obtained. Prior Anticoagulants: The patient has                            taken no anticoagulant or antiplatelet agents. ASA                            Grade Assessment: III - A patient with severe  systemic disease. After reviewing the risks and                            benefits, the patient was deemed in satisfactory                            condition to undergo the procedure.                           After obtaining informed consent, the endoscope was                            passed under direct vision. Throughout the                             procedure, the patient's blood pressure, pulse, and                            oxygen saturations were monitored continuously. The                            [id number]-H190 ([id number] Olympus endoscope was introduced                            through the mouth, and advanced to the second part                            of duodenum. The upper GI endoscopy was                            accomplished without difficulty. The patient                            tolerated the procedure well. Scope In: Scope Out: Findings:      Patchy, yellow plaques were found in the entire esophagus. Biopsies were       taken with a cold forceps for histology.      No other endoscopic abnormality was evident in the esophagus to explain       the patient's complaint of dysphagia. It was decided, however, to       proceed with dilation of the entire esophagus. A TTS dilator was passed       through the scope. Dilation with an 18-19-20 mm x 8 cm CRE balloon       dilator was performed to 20 mm. The dilation site was examined following       endoscope reinsertion and showed no change.      The stomach was normal.      The cardia and gastric fundus were normal on retroflexion.      The examined duodenum was normal. Impression:               - Esophageal plaques were found, suspicious for                            candidiasis. Biopsied.                           -  No endoscopic esophageal abnormality to explain                            patient's dysphagia. Esophagus dilated. Dilated.                           - Normal stomach.                           - Normal examined duodenum. Recommendation:           - Patient has a contact number available for                            emergencies. The signs and symptoms of potential                            delayed complications were discussed with the                            patient. Return to normal activities tomorrow.                            Written  discharge instructions were provided to the                            patient.                           - Mechanical soft diet                           - Continue present medications.                           - Await pathology results.                           - Diflucan 100mg  daily X 21 days                           - Follow an antireflux regimen. Procedure Code(s):        --- Professional ---                           [phone], Esophagogastroduodenoscopy, flexible,                            transoral; with transendoscopic balloon dilation of                            esophagus (less than 30 mm diameter)                           [id number], 59, Esophagogastroduodenoscopy, flexible,                            transoral; with biopsy,  single or multiple Diagnosis Code(s):        --- Professional ---                           K22.9, Disease of esophagus, unspecified                           R13.10, Dysphagia, unspecified                           R93.3, Abnormal findings on diagnostic imaging of                            other parts of digestive tract CPT copyright 2022 American [employer] Association. All rights reserved. The codes documented in this report are preliminary and upon coder review may  be revised to meet current compliance requirements. Napoleon Form, MD 01/24/2023 2:21:29 PM This report has been signed electronically. Number of Addenda: 0

## 2023-01-24 NOTE — Progress Notes (Signed)
Patient ID: Travis Beck, male   DOB: 1945-12-30, 77 y.o.   MRN: [id number]    Progress Note from the Palliative Medicine Team at [facility]   Patient Name: Travis Beck        Date: 01/24/2023 DOB: 08-16-1945  Age: 77 y.o. MRN#: [id number] Attending Physician: Steffanie Dunn, DO Primary Care Physician: Kirstie Peri, MD Admit Date: 01/06/2023    Extensive chart review has been completed prior to meeting with patient/family  including labs, vital signs, imaging, progress/consult notes, orders, medications and available advance directive documents.   77 y.o. male   admitted on 01/06/2023 with past   medical history significant of IIDM, HTN, SIDAH, COPD,  former smoker quit 1994 with a 79 pack year smoking history, diabetic neuropathy, recurrent pneumonia presented 01/06/2023 with worsening nausea /, cough and shortness of breath.    Patient was recently hospitalized 5/23-5/26 for multifocal MRSA pneumonia  and septic shock requiring vasopressor on admission-stabilized and subsequently discharged home on Augmentin/doxycycline .    Pt returned to [facility] 12/07/2022 with fever of 103.1 and ongoing left sided pleuritic pain. He has a small effusion that was not big enough to tap.  He was admitted for incompletely treated PNA-as sputum cultures finalized postdischarge, and Doxycycline did not cover MRSA. He was treated with Zyvox, and discharged home on Zyvox p.o on 12/13/2022.    Initially his breathing symptoms improved with Zyvox however patient continued to experience frequent nausea, vomiting and cough. He presented to the ED 6/29 as these symptoms were worsening.   01/06/23 swallow evaluation significant for esophageal dysphagia  01/08/23 worsening chest x-ray, significant for extensive right lung, mild left mid lung and left lower lobe interstitial airspace opacities.  Required intubation  01/10/2023-successfully  extubated  01/11/2023-increasing oxygen needs now on 100%  O2, increased agitation, worsening renal function/creatinine 5.28  01/12/2023 CRRT started, pressors  01/19/23   Patient remains with core track for nutritional support, he is high risk for aspiration secondary to dysphagia.  01-24-23  further swallow studies, goals are clearly set for all life prolong measures, hopeful for LTAC  Patient is high risk for decompensation overall.  This NP assessed patient at the bedside as a follow up  for palliative medicine needs and emotional support.      Patient is alert and communicative.     I spoke to his sister Marie/HPOA in family room.    Goals are clearing set, family are hopeful for LTAC.        Continued education regarding the patient's current medical situation and is high risk for decompensation secondary to his multiple co-morbidities; dysphagia, COPD, recurrent aspiration pneumonia, worsening renal function,  weakness and overall failure to thrive.   Plan of Care: -Full code     -Educated family to consider DNR/DNI status understanding evidenced based poor outcomes in similar hospitalized patient, as the cause of arrest is likely associated with advanced chronic illness rather than an easily reversible acute cardio-pulmonary event. - Patient/Family are open to all offered and available medical interventions to prolong life. - Hope for [employer] offered today regarding  the importance of continued conversation with family members  and the  medical providers regarding overall plan of care and treatment options,  ensuring decisions are within the context of the patients values and GOCs.  PMT will sign off at this time, re-consult for future needs, discussed with Dr Chestine Spore  Time: 25  minutes  Lorinda Creed NP  Palliative Medicine  Team Team Phone # [phone] Pager [phone]

## 2023-01-24 NOTE — Progress Notes (Signed)
Reassessed post- EGD. He reports that he feels great.  EGD: patchy yellow plaques throughout esophagus. No other focal abnormalities seen. Entire esophagus empirically dilated due to symptom burden. Bx pending of plaque.   BP 137/88   Pulse (!) 102   Temp (!) 96.6 F (35.9 C) (Axillary)   Resp (!) 21   Ht 5\' 5"  (1.651 m)   Wt 68.6 kg   SpO2 95%   BMI 25.17 kg/m  Awake, alert. NGT removed.   [facility] for [facility] morning. Path pending from esophagus Diflucan x 3 weeks Mechanical soft diet Con't PPI  Steffanie Dunn, DO 01/24/23 5:37 PM [facility]  For contact information, see Amion. If no response to pager, please call PCCM consult pager. After hours, 7PM- 7AM, please call Elink.

## 2023-01-24 NOTE — Progress Notes (Signed)
Sarah RN aware of order to remove CVC.  States to do shortly.

## 2023-01-24 NOTE — Progress Notes (Signed)
Pt refused BIPAP, pt states if he needs it he will call. Pt on 2L [place] resting comfortably. RT will monitor as needed.

## 2023-01-24 NOTE — Anesthesia Postprocedure Evaluation (Signed)
Anesthesia Post Note  Patient: Travis Beck  Procedure(s) Performed: ESOPHAGOGASTRODUODENOSCOPY (EGD) WITH PROPOFOL BALLOON DILATION BIOPSY     Patient location during evaluation: PACU Anesthesia Type: MAC Level of consciousness: awake and alert and oriented Pain management: pain level controlled Vital Signs Assessment: post-procedure vital signs reviewed and stable Respiratory status: spontaneous breathing, nonlabored ventilation and respiratory function stable Cardiovascular status: stable and blood pressure returned to baseline Postop Assessment: no apparent nausea or vomiting Anesthetic complications: no   No notable events documented.  Last Vitals:  Vitals:   01/24/23 1430 01/24/23 1440  BP: 125/72 120/83  Pulse: 85 88  Resp: 17 17  Temp:    SpO2: 95% 94%    Last Pain:  Vitals:   01/24/23 1440  TempSrc:   PainSc: 0-No pain                 Charde Macfarlane A.

## 2023-01-24 NOTE — Plan of Care (Signed)
  Problem: Education: Goal: Ability to describe self-care measures that may prevent or decrease complications (Diabetes Survival Skills Education) will improve Outcome: Progressing Goal: Individualized Educational Video(s) Outcome: Progressing   Problem: Coping: Goal: Ability to adjust to condition or change in health will improve Outcome: Progressing   Problem: Fluid Volume: Goal: Ability to maintain a balanced intake and output will improve Outcome: Progressing   Problem: Health Behavior/Discharge Planning: Goal: Ability to identify and utilize available resources and services will improve Outcome: Progressing Goal: Ability to manage health-related needs will improve Outcome: Progressing   Problem: Nutritional: Goal: Maintenance of adequate nutrition will improve Outcome: Progressing Goal: Progress toward achieving an optimal weight will improve Outcome: Progressing   Problem: Skin Integrity: Goal: Risk for impaired skin integrity will decrease Outcome: Progressing   Problem: Tissue Perfusion: Goal: Adequacy of tissue perfusion will improve Outcome: Progressing   Problem: Education: Goal: Knowledge of General Education information will improve Description: Including pain rating scale, medication(s)/side effects and non-pharmacologic comfort measures Outcome: Progressing   Problem: Health Behavior/Discharge Planning: Goal: Ability to manage health-related needs will improve Outcome: Progressing   Problem: Clinical Measurements: Goal: Ability to maintain clinical measurements within normal limits will improve Outcome: Progressing Goal: Will remain free from infection Outcome: Progressing Goal: Diagnostic test results will improve Outcome: Progressing Goal: Respiratory complications will improve Outcome: Progressing Goal: Cardiovascular complication will be avoided Outcome: Progressing   Problem: Activity: Goal: Risk for activity intolerance will  decrease Outcome: Progressing   Problem: Nutrition: Goal: Adequate nutrition will be maintained Outcome: Progressing   Problem: Coping: Goal: Level of anxiety will decrease Outcome: Progressing   Problem: Elimination: Goal: Will not experience complications related to bowel motility Outcome: Progressing Goal: Will not experience complications related to urinary retention Outcome: Progressing   Problem: Pain Managment: Goal: General experience of comfort will improve Outcome: Progressing   Problem: Safety: Goal: Ability to remain free from injury will improve Outcome: Progressing   Problem: Skin Integrity: Goal: Risk for impaired skin integrity will decrease Outcome: Progressing   Problem: Safety: Goal: Non-violent Restraint(s) Outcome: Progressing   Problem: Metabolic: Goal: Ability to maintain appropriate glucose levels will improve Outcome: Not Progressing

## 2023-01-24 NOTE — Interval H&P Note (Signed)
History and Physical Interval Note:  01/24/2023 1:20 PM  Travis Beck  has presented today for surgery, with the diagnosis of dysphagia and abnormal esophagram.  The various methods of treatment have been discussed with the patient and family. After consideration of risks, benefits and other options for treatment, the patient has consented to  Procedure(s): ESOPHAGOGASTRODUODENOSCOPY (EGD) WITH PROPOFOL (N/A) as a surgical intervention.  The patient's history has been reviewed, patient examined, no change in status, stable for surgery.  I have reviewed the patient's chart and labs.  Questions were answered to the patient's satisfaction.     [name]

## 2023-01-24 NOTE — Progress Notes (Signed)
NAME:  Travis Beck, MRN:  [id number], DOB:  Jan 16, 1946, LOS: 18 ADMISSION DATE:  01/06/2023, CONSULTATION DATE:  01/08/2023 REFERRING MD:  Marland Mcalpine, CHIEF COMPLAINT:  Aspiration Pneumonia, nausea and vomiting   History of Present Illness:  77 y.o. male with medical history significant of IIDM, HTN, SIDAH, Asthma, (Former smoker quit 1994 with a 66 pack year smoking history ) diabetic neuropathy, recurrent pneumonia presented 01/06/2023 with worsening of nauseous vomiting cough and shortness of breath.  Patient was recently hospitalized 5/23-5/26 for multifocal MRSA pneumonia  and septic shock requiring vasopressor on admission-stabilized and subsequently discharged home on Augmentin/doxycycline . Pt returned to [facility] 12/07/2022 with fever of 103.1 and ongoing left sided pleuritic pain. He has a small effusion that was not big enough to tap.  He was admitted for incompletely treated PNA-as sputum cultures finalized postdischarge, and Doxycycline did not cover MRSA. He was treated with Zyvox, and discharged home on Zyvox p.o on 12/13/2022. Initially his breathing symptoms improved with Zyvox however patient continued to experience frequent nausea, vomiting and cough. He presented to the ED 6/29 as these symptoms were worsening.  In the ED he was mildly febrile at 99.6, He had  tachycardia, oxygen sats were 93% on 3 L. CXR again showed a multifocal pneumonia, right sided. Na 128/ Creatinine of 4.2, BUN 66 and WBC of 20. His HGB was 8.7.Respiratory Panel was negative. Lactic Acid trend since admission 2.7>>2.8>>1,3 Pulmonary was  asked to consult for recurrent vs slow to resolve pneumonia and hypoxemia . Of note , patient is followed in the [facility]  Pulmonary clinic by Dr. Isaiah Serge and Rubye Oaks NP.  Per family patient has had about a 15 pound weight loss since the end of May 24 when pneumonia was initially diagnosed.  Pertinent  Medical History  Arthritis, Asthma, HTN, DM type  2  Significant Hospital Events: Including procedures, antibiotic start and stop dates in addition to other pertinent events   5/23-5/26 hospitalization for septic shock from multilobar PNA 5/30 -6/5 fever 103 F at home-ongoing left-sided pleuritic chest pain 6/29 Recurrent pneumonitis 7/01 intubated 7/03 extubated 7/04 complete ABx 7/05 start CRRT, off pressors 7/06 start steroids 7/11 blood back to the ICU for desaturations 7/13-had dialysis 7/15 On RA with sats of 100%, consider dc [facility] after swallow eval Interim History / Subjective:  This morning had nausea, which has improved He remains on RA.   Objective   BP (!) 154/81   Pulse 98   Temp (!) 97.5 F (36.4 C) (Oral)   Resp (!) 22   Ht 5\' 5"  (1.651 m)   Wt 68.6 kg   SpO2 93%   BMI 25.17 kg/m .   Examination: General -  chronically ill appearing man lying in bed in NAD HEENT-  /AT, eyes anicteric Cardiac - S1S2, RRR Chest - Rhales bilateraly, no rhonchi. No conversational dyspnea.  abdomen -  soft, NT Extremities -  increased ankle edema today, no cyanosis Skin: no diffuse rashes, warm, dry Neuro - Awake, alert, answering questions appropriately.  Na+ 134 BUN 92 Cr 2.76 H/H 8.2/25.1  Resolved Hospital Problem list     Assessment & Plan:   Acute hypoxemic respiratory failure with multifocal pneumonitis related to aspiration, previous pneumonia Possible TRALI after transfusion on 7/10 -BiPAP PRN -bronchodilators -pulmonary hygiene, control aspiration risk factors -hopefully EGD will be revealing  Acute kidney injury Uremia; required HD for metabolic clearance on 7/13, but did not require volume removal -strict I/O -removing HD  catheter today per Nephro -maintain adequate perfusion -avoid nephrotoxic meds  Acute metabolic encephalopathy secondary to hypoxia and renal failure>> Improving Chronic pain Depression/anxiety -con't buspar and clonazepam  -xanax PRN  History of asthma -con't  nebs- brovana, pulmicort, yupelri -PPI  Esophageal dysmotility, reflux -EGD this afternoon; can still get barium esophagram after as needed -con't PPI -zofran PRN for nausea -Appreciate GI's management ([facility])  -NPO until EGD; holding TF  Leg edema -lasix today  Severe protein calorie malnutrition Pathologic weight loss -TF on hold today for EGD -further dietary recs after additional esophagus evaluation is done  Iron deficiency anemia -transfuse for Hb <7 or hemodynamically significant bleeding  Type 2 diabetes; hypoglycemic overnight -holding long acting insulin -starting low dose dextrose today until able to get TF back on -goal BG 140-180  History of hyperlipidemia -con't pravastatin  RLS -mirapex  Discussed with Palliative care- available as needed, but will sign off for now.  Sister Hilda Lias updated via phone.  Will reassess after EGD to determine if he is stable to discharge to [facility].    Best Practice (right click and "Reselect all SmartList Selections" daily)   Diet/type: tube feeds- on hold for EGD DVT prophylaxis: SQ heparin GI prophylaxis: protonix Lines: Rt internal jugular HD catheter> removing today Foley:  n/a Code Status:  full code Last date of multidisciplinary goals of care discussion (updated family at bedside 7/11)  Labs       Latest Ref Rng & Units 01/24/2023    3:25 AM 01/23/2023    3:08 AM 01/22/2023    1:27 AM  CMP  Glucose 70 - 99 mg/dL 83  956  213   BUN 8 - 23 mg/dL 92  89  81   Creatinine 0.61 - 1.24 mg/dL 0.86  5.78  4.69   Sodium 135 - 145 mmol/L 134  133  132   Potassium 3.5 - 5.1 mmol/L 3.7  4.2  4.3   Chloride 98 - 111 mmol/L 99  96  97   CO2 22 - 32 mmol/L 25  26  26    Calcium 8.9 - 10.3 mg/dL 7.8  7.7  7.3       Latest Ref Rng & Units 01/23/2023    3:08 AM 01/22/2023    1:27 AM 01/21/2023    2:38 AM  CBC  WBC 4.0 - 10.5 K/uL 9.1  8.7  9.6   Hemoglobin 13.0 - 17.0 g/dL 7.5  7.4  7.4   Hematocrit 39.0 - 52.0 % 23.3   23.1  22.9   Platelets 150 - 400 K/uL 355  375  378     ABG    Component Value Date/Time   PHART 7.213 (L) 01/09/2023 0009   PCO2ART 52.9 (H) 01/09/2023 0009   PO2ART 170 (H) 01/09/2023 0009   HCO3 28.9 (H) 01/16/2023 0750   TCO2 23 01/09/2023 0009   ACIDBASEDEF 2.3 (H) 01/13/2023 0928   O2SAT 91.7 01/16/2023 0750    CBG (last 3)  Recent Labs    01/24/23 0329 01/24/23 0331 01/24/23 0752  GLUCAP 62* 74 65*    Steffanie Dunn, DO 01/24/23 12:18 PM [facility]  For contact information, see Amion. If no response to pager, please call PCCM consult pager. After hours, 7PM- 7AM, please call Elink.

## 2023-01-24 NOTE — Transfer of Care (Signed)
Immediate Anesthesia Transfer of Care Note  Patient: Travis Beck  Procedure(s) Performed: ESOPHAGOGASTRODUODENOSCOPY (EGD) WITH PROPOFOL BALLOON DILATION BIOPSY  Patient Location: Endoscopy Unit  Anesthesia Type:MAC  Level of Consciousness: drowsy and patient cooperative  Airway & Oxygen Therapy: Patient Spontanous Breathing and Patient connected to nasal cannula oxygen  Post-op Assessment: Report given to RN and Post -op Vital signs reviewed and stable  Post vital signs: Reviewed and stable, see endoscopy vital signs flow sheet   Last Vitals:  Vitals Value Taken Time  BP    Temp    Pulse    Resp    SpO2      Last Pain:  Vitals:   01/24/23 1227  TempSrc: Tympanic  PainSc: 0-No pain      Patients Stated Pain Goal: 0 (01/24/23 0400)  Complications: No notable events documented.

## 2023-01-24 NOTE — Anesthesia Procedure Notes (Signed)
Procedure Name: MAC Date/Time: 01/24/2023 1:52 PM  Performed by: Waynard Edwards, CRNAPre-anesthesia Checklist: Patient identified, Emergency Drugs available, Suction available and Patient being monitored Patient Re-evaluated:Patient Re-evaluated prior to induction Oxygen Delivery Method: Nasal cannula Induction Type: IV induction Airway Equipment and Method: Bite block Placement Confirmation: positive ETCO2 Dental Injury: Teeth and Oropharynx as per pre-operative assessment

## 2023-01-24 NOTE — Anesthesia Preprocedure Evaluation (Signed)
Anesthesia Evaluation  Patient identified by MRN, date of birth, ID band Patient awake    Reviewed: Allergy & Precautions, NPO status , Patient's Chart, lab work & pertinent test results  Airway Mallampati: II  TM Distance: >3 FB     Dental no notable dental hx. (+) Missing, Chipped, Dental Advisory Given, Poor Dentition   Pulmonary asthma , pneumonia, resolved, COPD,  COPD inhaler, former smoker   breath sounds clear to auscultation + decreased breath sounds      Cardiovascular hypertension, Normal cardiovascular exam Rhythm:Regular Rate:Normal     Neuro/Psych   Anxiety     negative neurological ROS     GI/Hepatic ,GERD  Medicated,,Dysphagia Abnormal esophagogram   Endo/Other  diabetes, Well Controlled, Type 2    Renal/GU Renal disease  negative genitourinary   Musculoskeletal  (+) Arthritis , Osteoarthritis,    Abdominal   Peds  Hematology  (+) Blood dyscrasia, anemia   Anesthesia Other Findings   Reproductive/Obstetrics                              Anesthesia Physical Anesthesia Plan  ASA: 3  Anesthesia Plan: MAC   Post-op Pain Management: Minimal or no pain anticipated   Induction: Intravenous  PONV Risk Score and Plan: 2 and Treatment may vary due to age or medical condition and Ondansetron  Airway Management Planned: Natural Airway and Simple Face Mask  Additional Equipment:   Intra-op Plan:   Post-operative Plan:   Informed Consent: I have reviewed the patients History and Physical, chart, labs and discussed the procedure including the risks, benefits and alternatives for the proposed anesthesia with the patient or authorized representative who has indicated his/her understanding and acceptance.     Dental advisory given  Plan Discussed with: CRNA and Anesthesiologist  Anesthesia Plan Comments:          Anesthesia Quick Evaluation

## 2023-01-25 ENCOUNTER — Encounter (HOSPITAL_COMMUNITY): Payer: Self-pay | Admitting: Gastroenterology

## 2023-01-25 ENCOUNTER — Inpatient Hospital Stay
Admission: AD | Admit: 2023-01-25 | Discharge: 2023-02-06 | Disposition: A | Discharge status: Skilled Nursing Facility | Payer: Medicare Other | Source: Other Acute Inpatient Hospital | Attending: Internal Medicine | Admitting: Internal Medicine

## 2023-01-25 DIAGNOSIS — R1312 Dysphagia, oropharyngeal phase: Secondary | ICD-10-CM | POA: Diagnosis not present

## 2023-01-25 DIAGNOSIS — E1140 Type 2 diabetes mellitus with diabetic neuropathy, unspecified: Secondary | ICD-10-CM | POA: Diagnosis not present

## 2023-01-25 DIAGNOSIS — E1122 Type 2 diabetes mellitus with diabetic chronic kidney disease: Secondary | ICD-10-CM | POA: Diagnosis not present

## 2023-01-25 DIAGNOSIS — E1165 Type 2 diabetes mellitus with hyperglycemia: Secondary | ICD-10-CM | POA: Diagnosis not present

## 2023-01-25 DIAGNOSIS — Z743 Need for continuous supervision: Secondary | ICD-10-CM | POA: Diagnosis not present

## 2023-01-25 DIAGNOSIS — E1121 Type 2 diabetes mellitus with diabetic nephropathy: Secondary | ICD-10-CM | POA: Diagnosis not present

## 2023-01-25 DIAGNOSIS — G4700 Insomnia, unspecified: Secondary | ICD-10-CM | POA: Diagnosis not present

## 2023-01-25 DIAGNOSIS — N390 Urinary tract infection, site not specified: Secondary | ICD-10-CM | POA: Diagnosis not present

## 2023-01-25 DIAGNOSIS — I5022 Chronic systolic (congestive) heart failure: Secondary | ICD-10-CM | POA: Diagnosis not present

## 2023-01-25 DIAGNOSIS — J440 Chronic obstructive pulmonary disease with acute lower respiratory infection: Secondary | ICD-10-CM | POA: Diagnosis not present

## 2023-01-25 DIAGNOSIS — D649 Anemia, unspecified: Secondary | ICD-10-CM | POA: Diagnosis not present

## 2023-01-25 DIAGNOSIS — I10 Essential (primary) hypertension: Secondary | ICD-10-CM | POA: Diagnosis present

## 2023-01-25 DIAGNOSIS — N184 Chronic kidney disease, stage 4 (severe): Secondary | ICD-10-CM | POA: Diagnosis not present

## 2023-01-25 DIAGNOSIS — R112 Nausea with vomiting, unspecified: Secondary | ICD-10-CM | POA: Diagnosis not present

## 2023-01-25 DIAGNOSIS — I340 Nonrheumatic mitral (valve) insufficiency: Secondary | ICD-10-CM | POA: Diagnosis not present

## 2023-01-25 DIAGNOSIS — E785 Hyperlipidemia, unspecified: Secondary | ICD-10-CM | POA: Diagnosis not present

## 2023-01-25 DIAGNOSIS — F339 Major depressive disorder, recurrent, unspecified: Secondary | ICD-10-CM | POA: Diagnosis not present

## 2023-01-25 DIAGNOSIS — J15212 Pneumonia due to Methicillin resistant Staphylococcus aureus: Secondary | ICD-10-CM | POA: Diagnosis not present

## 2023-01-25 DIAGNOSIS — J180 Bronchopneumonia, unspecified organism: Secondary | ICD-10-CM | POA: Diagnosis not present

## 2023-01-25 DIAGNOSIS — F29 Unspecified psychosis not due to a substance or known physiological condition: Secondary | ICD-10-CM | POA: Diagnosis not present

## 2023-01-25 DIAGNOSIS — J9600 Acute respiratory failure, unspecified whether with hypoxia or hypercapnia: Secondary | ICD-10-CM | POA: Diagnosis not present

## 2023-01-25 DIAGNOSIS — E7849 Other hyperlipidemia: Secondary | ICD-10-CM | POA: Diagnosis not present

## 2023-01-25 DIAGNOSIS — J9621 Acute and chronic respiratory failure with hypoxia: Secondary | ICD-10-CM | POA: Diagnosis not present

## 2023-01-25 DIAGNOSIS — M7989 Other specified soft tissue disorders: Secondary | ICD-10-CM | POA: Diagnosis not present

## 2023-01-25 DIAGNOSIS — R5383 Other fatigue: Secondary | ICD-10-CM | POA: Diagnosis not present

## 2023-01-25 DIAGNOSIS — K219 Gastro-esophageal reflux disease without esophagitis: Secondary | ICD-10-CM | POA: Diagnosis not present

## 2023-01-25 DIAGNOSIS — J189 Pneumonia, unspecified organism: Secondary | ICD-10-CM | POA: Diagnosis not present

## 2023-01-25 DIAGNOSIS — G8929 Other chronic pain: Secondary | ICD-10-CM | POA: Diagnosis not present

## 2023-01-25 DIAGNOSIS — B961 Klebsiella pneumoniae [K. pneumoniae] as the cause of diseases classified elsewhere: Secondary | ICD-10-CM | POA: Diagnosis not present

## 2023-01-25 DIAGNOSIS — R1310 Dysphagia, unspecified: Secondary | ICD-10-CM | POA: Diagnosis not present

## 2023-01-25 DIAGNOSIS — R1319 Other dysphagia: Secondary | ICD-10-CM | POA: Diagnosis not present

## 2023-01-25 DIAGNOSIS — F411 Generalized anxiety disorder: Secondary | ICD-10-CM | POA: Diagnosis not present

## 2023-01-25 DIAGNOSIS — N170 Acute kidney failure with tubular necrosis: Secondary | ICD-10-CM | POA: Diagnosis not present

## 2023-01-25 DIAGNOSIS — M6281 Muscle weakness (generalized): Secondary | ICD-10-CM | POA: Diagnosis not present

## 2023-01-25 DIAGNOSIS — F419 Anxiety disorder, unspecified: Secondary | ICD-10-CM | POA: Diagnosis not present

## 2023-01-25 DIAGNOSIS — A419 Sepsis, unspecified organism: Secondary | ICD-10-CM | POA: Diagnosis not present

## 2023-01-25 DIAGNOSIS — R600 Localized edema: Secondary | ICD-10-CM | POA: Diagnosis not present

## 2023-01-25 DIAGNOSIS — I130 Hypertensive heart and chronic kidney disease with heart failure and stage 1 through stage 4 chronic kidney disease, or unspecified chronic kidney disease: Secondary | ICD-10-CM | POA: Diagnosis not present

## 2023-01-25 DIAGNOSIS — J9601 Acute respiratory failure with hypoxia: Secondary | ICD-10-CM | POA: Diagnosis not present

## 2023-01-25 DIAGNOSIS — E871 Hypo-osmolality and hyponatremia: Secondary | ICD-10-CM | POA: Diagnosis not present

## 2023-01-25 DIAGNOSIS — Z87891 Personal history of nicotine dependence: Secondary | ICD-10-CM | POA: Diagnosis not present

## 2023-01-25 DIAGNOSIS — E119 Type 2 diabetes mellitus without complications: Secondary | ICD-10-CM | POA: Diagnosis not present

## 2023-01-25 DIAGNOSIS — I501 Left ventricular failure: Secondary | ICD-10-CM | POA: Diagnosis not present

## 2023-01-25 DIAGNOSIS — G7281 Critical illness myopathy: Secondary | ICD-10-CM | POA: Diagnosis not present

## 2023-01-25 DIAGNOSIS — B3781 Candidal esophagitis: Secondary | ICD-10-CM | POA: Diagnosis not present

## 2023-01-25 DIAGNOSIS — R0602 Shortness of breath: Secondary | ICD-10-CM | POA: Diagnosis not present

## 2023-01-25 LAB — GLUCOSE, CAPILLARY
Glucose-Capillary: 383 mg/dL — ABNORMAL HIGH (ref 70–99)
Glucose-Capillary: 358 mg/dL — ABNORMAL HIGH (ref 70–99)
Glucose-Capillary: 275 mg/dL — ABNORMAL HIGH (ref 70–99)
Glucose-Capillary: 278 mg/dL — ABNORMAL HIGH (ref 70–99)
Glucose-Capillary: 244 mg/dL — ABNORMAL HIGH (ref 70–99)
Glucose-Capillary: 174 mg/dL — ABNORMAL HIGH (ref 70–99)
Glucose-Capillary: 162 mg/dL — ABNORMAL HIGH (ref 70–99)
Glucose-Capillary: 124 mg/dL — ABNORMAL HIGH (ref 70–99)
Glucose-Capillary: 123 mg/dL — ABNORMAL HIGH (ref 70–99)
Glucose-Capillary: 110 mg/dL — ABNORMAL HIGH (ref 70–99)
Glucose-Capillary: 150 mg/dL — ABNORMAL HIGH (ref 70–99)

## 2023-01-25 LAB — BASIC METABOLIC PANEL
Anion gap: 11 (ref 5–15)
BUN: 83 mg/dL — ABNORMAL HIGH (ref 8–23)
CO2: 20 mmol/L — ABNORMAL LOW (ref 22–32)
Calcium: 7.3 mg/dL — ABNORMAL LOW (ref 8.9–10.3)
Chloride: 95 mmol/L — ABNORMAL LOW (ref 98–111)
Creatinine, Ser: 2.87 mg/dL — ABNORMAL HIGH (ref 0.61–1.24)
GFR, Estimated: 22 mL/min — ABNORMAL LOW (ref >60)
Glucose, Bld: 124 mg/dL — ABNORMAL HIGH (ref 70–99)
Potassium: 3.9 mmol/L (ref 3.5–5.1)
Sodium: 126 mmol/L — ABNORMAL LOW (ref 135–145)

## 2023-01-25 LAB — RENAL FUNCTION PANEL
Albumin: 1.8 g/dL — ABNORMAL LOW (ref 3.5–5.0)
Anion gap: 17 — ABNORMAL HIGH (ref 5–15)
BUN: 94 mg/dL — ABNORMAL HIGH (ref 8–23)
CO2: 21 mmol/L — ABNORMAL LOW (ref 22–32)
Calcium: 7.4 mg/dL — ABNORMAL LOW (ref 8.9–10.3)
Chloride: 93 mmol/L — ABNORMAL LOW (ref 98–111)
Creatinine, Ser: 2.98 mg/dL — ABNORMAL HIGH (ref 0.61–1.24)
GFR, Estimated: 21 mL/min — ABNORMAL LOW (ref >60)
Glucose, Bld: 460 mg/dL — ABNORMAL HIGH (ref 70–99)
Phosphorus: 7.6 mg/dL — ABNORMAL HIGH (ref 2.5–4.6)
Potassium: 4.7 mmol/L (ref 3.5–5.1)
Sodium: 131 mmol/L — ABNORMAL LOW (ref 135–145)

## 2023-01-25 LAB — PHOSPHORUS: Phosphorus: 7 mg/dL — ABNORMAL HIGH (ref 2.5–4.6)

## 2023-01-25 LAB — MAGNESIUM
Magnesium: 2.1 mg/dL (ref 1.7–2.4)
Magnesium: 2.1 mg/dL (ref 1.7–2.4)

## 2023-01-25 LAB — SURGICAL PATHOLOGY

## 2023-01-25 MED ORDER — TRAZODONE HCL 50 MG PO TABS: 50.0000 mg | ORAL_TABLET | Freq: Every day | ORAL | Status: DC

## 2023-01-25 MED ORDER — INSULIN ASPART 100 UNIT/ML IJ SOLN: 0.0000 [IU] | Freq: Three times a day (TID) | INTRAMUSCULAR | Status: DC

## 2023-01-25 MED ORDER — PRAMIPEXOLE DIHYDROCHLORIDE 0.25 MG PO TABS: 0.5000 mg | ORAL_TABLET | Freq: Every day | ORAL | Status: DC

## 2023-01-25 MED ORDER — PRAVASTATIN SODIUM 40 MG PO TABS: 20.0000 mg | ORAL_TABLET | Freq: Every evening | ORAL | Status: DC

## 2023-01-25 MED ORDER — ALPRAZOLAM 0.5 MG PO TABS: 0.5000 mg | ORAL_TABLET | Freq: Four times a day (QID) | ORAL | Status: DC | PRN

## 2023-01-25 MED ORDER — INSULIN ASPART 100 UNIT/ML IJ SOLN: 0.0000 [IU] | Freq: Three times a day (TID) | INTRAMUSCULAR | Status: AC

## 2023-01-25 MED ORDER — LORATADINE 10 MG PO TABS: 10.0000 mg | ORAL_TABLET | ORAL | Status: DC

## 2023-01-25 MED ORDER — INSULIN ASPART 100 UNIT/ML IJ SOLN: 0.0000 [IU] | Freq: Every day | INTRAMUSCULAR | Status: AC

## 2023-01-25 MED ORDER — PREDNISONE 10 MG PO TABS: 10.0000 mg | ORAL_TABLET | Freq: Every day | ORAL | Status: DC

## 2023-01-25 MED ORDER — POLYETHYLENE GLYCOL 3350 17 G PO PACK: 17.0000 g | PACK | Freq: Two times a day (BID) | ORAL | Status: DC

## 2023-01-25 MED ORDER — PANTOPRAZOLE SODIUM 40 MG PO TBEC
40.0000 mg | DELAYED_RELEASE_TABLET | Freq: Every day | ORAL | Status: DC
  Administered 2023-01-25: 40 mg via ORAL
  Filled 2023-01-25: qty 1

## 2023-01-25 MED ORDER — MONTELUKAST SODIUM 10 MG PO TABS: 10.0000 mg | ORAL_TABLET | Freq: Every day | ORAL | Status: DC

## 2023-01-25 MED ORDER — METHYLPREDNISOLONE SODIUM SUCC 125 MG IJ SOLR
50.0000 mg | Freq: Every day | INTRAMUSCULAR | Status: DC
  Administered 2023-01-25: 50 mg via INTRAVENOUS
  Filled 2023-01-25: qty 2

## 2023-01-25 MED ORDER — INSULIN GLARGINE-YFGN 100 UNIT/ML ~~LOC~~ SOLN: 10.0000 [IU] | Freq: Two times a day (BID) | SUBCUTANEOUS | Status: AC

## 2023-01-25 MED ORDER — DOCUSATE SODIUM 100 MG PO CAPS
100.0000 mg | ORAL_CAPSULE | Freq: Two times a day (BID) | ORAL | Status: DC
  Administered 2023-01-25: 100 mg via ORAL
  Filled 2023-01-25: qty 1

## 2023-01-25 MED ORDER — ACETAMINOPHEN 325 MG PO TABS: 650.0000 mg | ORAL_TABLET | Freq: Four times a day (QID) | ORAL | Status: DC | PRN

## 2023-01-25 MED ORDER — RENA-VITE PO TABS: 1.0000 | ORAL_TABLET | Freq: Every day | ORAL | Status: DC

## 2023-01-25 MED ORDER — PREDNISONE 20 MG PO TABS: 30.0000 mg | ORAL_TABLET | Freq: Every day | ORAL | Status: DC

## 2023-01-25 MED ORDER — PREDNISONE 20 MG PO TABS: 40.0000 mg | ORAL_TABLET | Freq: Every day | ORAL | Status: DC

## 2023-01-25 MED ORDER — POLYETHYLENE GLYCOL 3350 17 G PO PACK
17.0000 g | PACK | Freq: Two times a day (BID) | ORAL | Status: DC
  Administered 2023-01-25: 17 g via ORAL
  Filled 2023-01-25: qty 1

## 2023-01-25 MED ORDER — HEPARIN SODIUM (PORCINE) 5000 UNIT/ML IJ SOLN
5000.0000 [IU] | Freq: Three times a day (TID) | INTRAMUSCULAR | Status: DC
  Administered 2023-01-25: 5000 [IU] via SUBCUTANEOUS
  Filled 2023-01-25: qty 1

## 2023-01-25 MED ORDER — FLUCONAZOLE 100 MG PO TABS
100.0000 mg | ORAL_TABLET | Freq: Every day | ORAL | Status: DC
  Administered 2023-01-25: 100 mg via ORAL
  Filled 2023-01-25: qty 1

## 2023-01-25 MED ORDER — INSULIN GLARGINE-YFGN 100 UNIT/ML ~~LOC~~ SOLN
10.0000 [IU] | Freq: Two times a day (BID) | SUBCUTANEOUS | Status: DC
  Administered 2023-01-25: 10 [IU] via SUBCUTANEOUS
  Filled 2023-01-25 (×2): qty 0.1

## 2023-01-25 MED ORDER — PREDNISONE 20 MG PO TABS: 40.0000 mg | ORAL_TABLET | Freq: Every day | ORAL | Status: AC

## 2023-01-25 MED ORDER — SENNA 8.6 MG PO TABS: 2.0000 | ORAL_TABLET | Freq: Every day | ORAL | Status: DC

## 2023-01-25 MED ORDER — AMLODIPINE BESYLATE 10 MG PO TABS
10.0000 mg | ORAL_TABLET | Freq: Every day | ORAL | Status: DC
  Administered 2023-01-25: 10 mg via ORAL
  Filled 2023-01-25: qty 1

## 2023-01-25 MED ORDER — SACCHAROMYCES BOULARDII 250 MG PO CAPS
250.0000 mg | ORAL_CAPSULE | Freq: Two times a day (BID) | ORAL | Status: DC
  Administered 2023-01-25: 250 mg via ORAL
  Filled 2023-01-25 (×2): qty 1

## 2023-01-25 MED ORDER — ASPIRIN 81 MG PO CHEW
81.0000 mg | CHEWABLE_TABLET | Freq: Every day | ORAL | Status: DC
  Administered 2023-01-25: 81 mg via ORAL
  Filled 2023-01-25: qty 1

## 2023-01-25 MED ORDER — FLUCONAZOLE 100 MG PO TABS: 100.0000 mg | ORAL_TABLET | Freq: Every day | ORAL | Status: AC

## 2023-01-25 MED ORDER — CLONAZEPAM 0.5 MG PO TBDP
0.5000 mg | ORAL_TABLET | Freq: Two times a day (BID) | ORAL | Status: DC
  Administered 2023-01-25: 0.5 mg via ORAL
  Filled 2023-01-25: qty 1

## 2023-01-25 MED ORDER — INSULIN ASPART 100 UNIT/ML IJ SOLN: 3.0000 [IU] | Freq: Three times a day (TID) | INTRAMUSCULAR | Status: DC

## 2023-01-25 MED ORDER — PREDNISONE 20 MG PO TABS: 20.0000 mg | ORAL_TABLET | Freq: Every day | ORAL | Status: AC

## 2023-01-25 MED ORDER — INSULIN ASPART 100 UNIT/ML IJ SOLN: 0.0000 [IU] | Freq: Every day | INTRAMUSCULAR | Status: DC

## 2023-01-25 MED ORDER — PREDNISONE 20 MG PO TABS: 20.0000 mg | ORAL_TABLET | Freq: Every day | ORAL | Status: DC

## 2023-01-25 MED ORDER — METHYLPREDNISOLONE SODIUM SUCC 125 MG IJ SOLR: 50.0000 mg | Freq: Every day | INTRAMUSCULAR | Status: AC

## 2023-01-25 MED ORDER — INSULIN ASPART 100 UNIT/ML IJ SOLN: 3.0000 [IU] | Freq: Three times a day (TID) | INTRAMUSCULAR | Status: AC

## 2023-01-25 MED ORDER — ALBUTEROL SULFATE (2.5 MG/3ML) 0.083% IN NEBU: 2.5000 mg | INHALATION_SOLUTION | RESPIRATORY_TRACT | Status: AC | PRN

## 2023-01-25 MED ORDER — PREDNISONE 10 MG PO TABS: 10.0000 mg | ORAL_TABLET | Freq: Every day | ORAL | Status: AC

## 2023-01-25 MED ORDER — PREDNISONE 10 MG PO TABS: 30.0000 mg | ORAL_TABLET | Freq: Every day | ORAL | Status: AC

## 2023-01-25 MED ORDER — BUSPIRONE HCL 5 MG PO TABS
5.0000 mg | ORAL_TABLET | Freq: Three times a day (TID) | ORAL | Status: DC
  Administered 2023-01-25 (×2): 5 mg via ORAL
  Filled 2023-01-25 (×2): qty 1

## 2023-01-25 NOTE — Progress Notes (Signed)
NAME:  Travis Beck, MRN:  [id number], DOB:  09-28-1945, LOS: 19 ADMISSION DATE:  01/06/2023, CONSULTATION DATE:  01/08/2023 REFERRING MD:  Marland Mcalpine, CHIEF COMPLAINT:  Aspiration Pneumonia, nausea and vomiting   History of Present Illness:   77 y.o. male with medical history significant of IIDM, HTN, SIDAH, Asthma, (Former smoker quit 1994 with a 66 pack year smoking history ) diabetic neuropathy, recurrent pneumonia presented 01/06/2023 with worsening of nauseous vomiting cough and shortness of breath.  Patient was recently hospitalized 5/23-5/26 for multifocal MRSA pneumonia  and septic shock requiring vasopressor on admission-stabilized and subsequently discharged home on Augmentin/doxycycline . Pt returned to [facility] 12/07/2022 with fever of 103.1 and ongoing left sided pleuritic pain. He has a small effusion that was not big enough to tap.  He was admitted for incompletely treated PNA-as sputum cultures finalized postdischarge, and Doxycycline did not cover MRSA. He was treated with Zyvox, and discharged home on Zyvox p.o on 12/13/2022. Initially his breathing symptoms improved with Zyvox however patient continued to experience frequent nausea, vomiting and cough. He presented to the ED 6/29 as these symptoms were worsening.  In the ED he was mildly febrile at 99.6, He had  tachycardia, oxygen sats were 93% on 3 L. CXR again showed a multifocal pneumonia, right sided. Na 128/ Creatinine of 4.2, BUN 66 and WBC of 20. His HGB was 8.7.Respiratory Panel was negative. Lactic Acid trend since admission 2.7>>2.8>>1,3 Pulmonary was  asked to consult for recurrent vs slow to resolve pneumonia and hypoxemia . Of note , patient is followed in the [facility]  Pulmonary clinic by Dr. Isaiah Serge and Rubye Oaks NP.  Per family patient has had about a 15 pound weight loss since the end of May 24 when pneumonia was initially diagnosed.  Pertinent  Medical History  Arthritis, Asthma, HTN, DM type  2  Significant Hospital Events: Including procedures, antibiotic start and stop dates in addition to other pertinent events   5/23-5/26 hospitalization for septic shock from multilobar PNA 5/30 -6/5 fever 103 F at home-ongoing left-sided pleuritic chest pain 6/29 Recurrent pneumonitis 7/01 intubated 7/03 extubated 7/04 complete ABx 7/05 start CRRT, off pressors 7/06 start steroids 7/11 blood back to the ICU for desaturations 7/13-had dialysis 7/15 On RA with sats of 100%, consider dc to [facility] after swallow eval  Interim History / Subjective:   Started on insulin drip overnight for hyperglycemia.  Objective   BP (!) 126/52   Pulse (!) 46   Temp 97.8 F (36.6 C) (Oral)   Resp (!) 24   Ht 5\' 5"  (1.651 m)   Wt 68.6 kg   SpO2 97%   BMI 25.17 kg/m .   Examination: Gen:      No acute distress HEENT:  EOMI, sclera anicteric Neck:     No masses; no thyromegaly Lungs:    Clear to auscultation bilaterally; normal respiratory effort CV:         Regular rate and rhythm; no murmurs Abd:      + bowel sounds; soft, non-tender; no palpable masses, no distension Ext:    No edema; adequate peripheral perfusion Skin:      Warm and dry; no rash Neuro: alert and oriented x 3 Psych: normal mood and affect   Labs/imaging reviewed Significant for sodium 131, BUN/creatinine 94/2.98 Hemoglobin 7.5, platelets 355 No new imaging  Resolved Hospital Problem list     Assessment & Plan:   Acute hypoxemic respiratory failure with multifocal pneumonitis related to aspiration,  previous pneumonia Possible TRALI after transfusion on 7/10 EGD showing thrush with no other abnormalities.  Empirically dilated Continue to wean oxygen as tolerated BiPAP as needed, bronchodilators  Acute kidney injury Uremia; required HD for metabolic clearance on 7/13, but did not require volume removal Monitor urine output and creatinine.  Acute metabolic encephalopathy secondary to hypoxia and renal failure>>  Improving Chronic pain Depression/anxiety Continue BuSpar, clonazepam Xanax as needed  History of asthma Continue nebs, PPI  Esophageal dysmotility, reflux EGD results noted.  Continue PPI  Leg edema Lasix as needed  Severe protein calorie malnutrition Pathologic weight loss P.o. diet  Iron deficiency anemia -transfuse for Hb <7 or hemodynamically significant bleeding  Type 2 diabetes; hypoglycemic overnight -holding long acting insulin -starting low dose dextrose today until able to get TF back on -goal BG 140-180  History of hyperlipidemia -con't pravastatin  RLS -mirapex  Discussed with Palliative care- available as needed, but will sign off for now.  Sister Hilda Lias updated via phone.    Stable for [facility] to [facility] today.   Best Practice (right click and "Reselect all SmartList Selections" daily)   Diet/type: tube feeds- on hold for EGD DVT prophylaxis: SQ heparin GI prophylaxis: protonix Lines: n/a Foley:  n/a Code Status:  full code Last date of multidisciplinary goals of care discussion (updated family at bedside 7/11)  Signature:   Chilton Greathouse MD [place] See Amion for pager  If no response to pager , please call [phone] until 7pm After 7:00 pm call Elink  [phone] 01/25/2023, 9:17 AM

## 2023-01-25 NOTE — Discharge Summary (Signed)
Physician Discharge Summary  Patient ID: Travis Beck MRN: [id number] DOB/AGE: August 02, 1945 77 y.o.  Admit date: 01/06/2023 Discharge date: 01/25/2023  Admission Diagnoses: Acute respiratory failure with hypoxia Aspiration pneumonia   Discharge Diagnoses:  Principal Problem:   Pneumonia Active Problems:   Acute exacerbation of COPD with asthma (HCC)   Acute respiratory failure with hypoxia (HCC)   Aspiration pneumonia (HCC)   Malnutrition of moderate degree   Abnormal barium swallow   History of esophageal stricture   Recurrent pneumonia   Other dysphagia   Dysphagia   AKI (acute kidney injury) (HCC)   Sepsis due to methicillin resistant Staphylococcus aureus (MRSA) with acute renal failure without septic shock (HCC)   Esophageal stricture   Candida esophagitis (HCC) Candida esophagitis Asthma with acute exacerbation Azotemia Aspiration pneumonitis Aspiration pneumonia GERD Severe protein calorie malnutrition IDA Pathologic weight loss DM 2 Restless leg syndrome History of HLD  Discharged Condition: stable  Hospital Course:  77 y.o. male with medical history significant of IIDM, HTN, SIDAH, COPD, ( Former smoker quit 1994 with a 66 pack year smoking history diabetic neuropathy, recurrent pneumonia presented 01/06/2023 with worsening of nauseous vomiting cough and shortness of breath.  Patient was recently hospitalized 5/23-5/26 for multifocal MRSA pneumonia  and septic shock requiring vasopressor on admission-stabilized and subsequently discharged home on Augmentin/doxycycline . Pt returned to [facility] 12/07/2022 with fever of 103.1 and ongoing left sided pleuritic pain. He has a small effusion that was not big enough to tap.  He was admitted for incompletely treated PNA-as sputum cultures finalized postdischarge, and Doxycycline did not cover MRSA. He was treated with Zyvox, and discharged home on Zyvox p.o on 12/13/2022. Initially his breathing symptoms  improved with Zyvox however patient continued to experience frequent nausea, vomiting and cough. He presented to the ED 6/29 as these symptoms were worsening.  In the ED he was mildly febrile at 99.6, He had  tachycardia, oxygen sats were 93% on 3 L. CXR again showed a multifocal pneumonia, right sided. Na 128/ Creatinine of 4.2, BUN 66 and WBC of 20. His HGB was 8.7.Respiratory Panel was negative. Lactic Acid trend since admission 2.7>>2.8>>1,3 Pulmonary was  asked to consult for recurrent vs slow to resolve pneumonia and hypoxemia . Of note , patient is followed in the [facility]  Pulmonary clinic by Dr. Isaiah Serge and Rubye Oaks NP. Per family patient has had about a 15 pound weight loss since the end of May 24 when pneumonia was initially diagnosed.  5/23-5/26 hospitalization for septic shock from multilobar PNA 5/30 -6/5 fever 103 F at home-ongoing left-sided pleuritic chest pain 6/29 Recurrent pneumonitis 7/01 intubated 7/03 extubated 7/04 complete ABx 7/05 start CRRT, off pressors 7/06 start steroids 7/11 blood back to the ICU for desaturations 7/13-had dialysis 7/15 On RA with sats of 100%, needs EGD 7/17 EGD- biopsy of yellow plaques, started diflucan, empiric whole esophagus dilation.  7/18 Seen by SLP and reccommended dysphagia 3 diet. Tolerating PO intake   Consults: pulmonary/intensive care and GI  Significant Diagnostic Studies:       Latest Ref Rng & Units 01/24/2023   10:44 AM 01/23/2023    3:08 AM 01/22/2023    1:27 AM  CBC  WBC 4.0 - 10.5 K/uL  9.1  8.7   Hemoglobin 13.0 - 17.0 g/dL 8.2  7.5  7.4   Hematocrit 39.0 - 52.0 % 25.1  23.3  23.1   Platelets 150 - 400 K/uL  355  375  Latest Ref Rng & Units 01/25/2023   11:00 AM 01/24/2023   11:43 PM 01/24/2023    8:10 PM  BMP  Glucose 70 - 99 mg/dL 161  096  045   BUN 8 - 23 mg/dL 83  94    Creatinine 4.09 - 1.24 mg/dL 8.11  9.14    Sodium 782 - 145 mmol/L 126  131    Potassium 3.5 - 5.1 mmol/L 3.9  4.7     Chloride 98 - 111 mmol/L 95  93    CO2 22 - 32 mmol/L 20  21    Calcium 8.9 - 10.3 mg/dL 7.3  7.4       9/56 blood culture: NG Urine culture NG Respiratory culture: candida albicans  Treatments: mechanical ventilation, antibiotics, steroids, EGD with empiric esophageal dilation  Discharge Exam: Blood pressure 114/82, pulse 61, temperature (!) 96 F (35.6 C), temperature source Axillary, resp. rate 19, height 5\' 5"  (1.651 m), weight 68.6 kg, SpO2 99%. Blood pressure 114/82, pulse 61, temperature (!) 96 F (35.6 C), temperature source Axillary, resp. rate 19, height 5\' 5"  (1.651 m), weight 68.6 kg, SpO2 99%. Gen:      No acute distress HEENT:  EOMI, sclera anicteric Neck:     No masses; no thyromegaly Lungs:    Clear to auscultation bilaterally; normal respiratory effort CV:         Regular rate and rhythm; no murmurs Abd:      + bowel sounds; soft, non-tender; no palpable masses, no distension Ext:    No edema; adequate peripheral perfusion Skin:      Warm and dry; no rash Neuro: alert and oriented x 3 Psych: normal mood and affect   Disposition: Discharge disposition: 02-Transferred to [facility]   Allergies as of 01/25/2023       Reactions   Penicillins Rash   No problems with ampicillin during hospitalization        Medication List     STOP taking these medications    glipiZIDE 10 MG 24 hr tablet Commonly known as: GLUCOTROL XL   metFORMIN 1000 MG tablet Commonly known as: GLUCOPHAGE   Mucus Relief 600 MG 12 hr tablet Generic drug: guaiFENesin       TAKE these medications    acetaminophen 325 MG tablet Commonly known as: TYLENOL Take 2 tablets (650 mg total) by mouth every 6 (six) hours as needed for mild pain (or Fever >/= 101).   albuterol 108 (90 Base) MCG/ACT inhaler Commonly known as: VENTOLIN HFA Inhale 2 puffs into the lungs every 4 (four) hours as needed for wheezing or shortness of breath. What changed: how  much to take   albuterol (2.5 MG/3ML) 0.083% nebulizer solution Commonly known as: PROVENTIL Take 3 mLs (2.5 mg total) by nebulization every 4 (four) hours as needed for wheezing or shortness of breath. What changed: You were already taking a medication with the same name, and this prescription was added. Make sure you understand how and when to take each.   ALPRAZolam 1 MG tablet Commonly known as: XANAX Take 1 tablet (1 mg total) by mouth 3 (three) times daily as needed for anxiety. What changed:  how much to take when to take this reasons to take this   amLODipine 10 MG tablet Commonly known as: NORVASC Take 10 mg by mouth at bedtime.   Breztri Aerosphere 160-9-4.8 MCG/ACT Aero Generic drug: Budeson-Glycopyrrol-Formoterol Inhale 2 puffs into the lungs in the  morning and at bedtime.   buprenorphine 8 MG Subl SL tablet Commonly known as: SUBUTEX Place 4 mg under the tongue as needed (pain).   cetirizine 10 MG tablet Commonly known as: ZYRTEC Take 10 mg by mouth at bedtime.   famotidine 20 MG tablet Commonly known as: PEPCID Take 1 tablet (20 mg total) by mouth 2 (two) times daily for 7 days. Take one tablet twice daily for two days What changed:  when to take this reasons to take this additional instructions   fluconazole 100 MG tablet Commonly known as: DIFLUCAN Take 1 tablet (100 mg total) by mouth daily. Start taking on: January 26, 2023   fluticasone 50 MCG/ACT nasal spray Commonly known as: FLONASE Place 1 spray into both nostrils daily.   gabapentin 400 MG capsule Commonly known as: NEURONTIN Take 1 capsule (400 mg total) by mouth every 8 (eight) hours as needed. What changed:  how much to take when to take this reasons to take this   insulin aspart 100 UNIT/ML injection Commonly known as: novoLOG Inject 3 Units into the skin 3 (three) times daily with meals.   insulin aspart 100 UNIT/ML injection Commonly known as: novoLOG Inject 0-5 Units into the  skin at bedtime.   insulin aspart 100 UNIT/ML injection Commonly known as: novoLOG Inject 0-15 Units into the skin 3 (three) times daily with meals.   insulin glargine-yfgn 100 UNIT/ML injection Commonly known as: SEMGLEE Inject 0.1 mLs (10 Units total) into the skin 2 (two) times daily.   methylPREDNISolone sodium succinate 125 mg/2 mL injection Commonly known as: SOLU-MEDROL Inject 0.8 mLs (50 mg total) into the vein daily. Start taking on: January 26, 2023   montelukast 10 MG tablet Commonly known as: SINGULAIR Take 1 tablet (10 mg total) by mouth at bedtime.   ondansetron 4 MG disintegrating tablet Commonly known as: ZOFRAN-ODT Take 1 tablet (4 mg total) by mouth every 8 (eight) hours as needed for nausea or vomiting. Consider taking 30 minutes prior to meals. What changed:  when to take this additional instructions   pantoprazole 40 MG tablet Commonly known as: PROTONIX Take 1 tablet (40 mg total) by mouth daily.   pramipexole 0.5 MG tablet Commonly known as: MIRAPEX Take 0.5 mg by mouth at bedtime.   pravastatin 20 MG tablet Commonly known as: PRAVACHOL Take 20 mg by mouth every evening.   predniSONE 20 MG tablet Commonly known as: DELTASONE Take 2 tablets (40 mg total) by mouth daily with breakfast. Start taking on: January 28, 2023   predniSONE 10 MG tablet Commonly known as: DELTASONE Take 3 tablets (30 mg total) by mouth daily with breakfast. Start taking on: January 31, 2023   predniSONE 20 MG tablet Commonly known as: DELTASONE Take 1 tablet (20 mg total) by mouth daily with breakfast. Start taking on: February 03, 2023   predniSONE 10 MG tablet Commonly known as: DELTASONE Take 1 tablet (10 mg total) by mouth daily with breakfast. Start taking on: February 06, 2023   sucralfate 1 g tablet Commonly known as: Carafate Take 1 tablet (1 g total) by mouth 4 (four) times daily -  with meals and at bedtime.   valsartan 160 MG tablet Commonly known as: DIOVAN Take  160 mg by mouth daily.         Signed: Chilton Greathouse MD [place] [facility] See Amion for pager  If no response to pager , please call [phone] until 7pm After 7:00 pm call  Elink  [phone] 01/25/2023, 2:30 PM

## 2023-01-25 NOTE — Progress Notes (Signed)
Pt's bedside monitor shows frequent PVC's, MD notified, lab drawn.

## 2023-01-25 NOTE — Progress Notes (Signed)
Pt transferred to [facility]. Pt's sister and niece at bedside. V/S/S.

## 2023-01-25 NOTE — TOC Transition Note (Signed)
Transition of Care [facility]) - CM/SW Discharge Note   Patient Details  Name: Travis Beck MRN: [id number] Date of Birth: 1946/05/19  [facility]) CM/SW Contact:  Ralene Bathe, LCSW Phone Number: 01/25/2023, 3:01 PM   Clinical Narrative:    Patient will DC to: [facility]  Anticipated DC date: 01/25/2023   Per MD patient ready for DC to [facility]. RN to call report prior to discharge [phone] room 2). RN and facility notified of DC. Discharge Summary a sent to facility.   CSW will sign off for now as social work intervention is no longer needed. Please consult [place] again if new needs arise.    Final next level of care: Long Term Acute Care (LTAC) Barriers to Discharge: Barriers Resolved   Patient Goals and CMS Choice CMS Medicare.gov Compare Post Acute Care list provided to:: Patient Choice offered to / list presented to : Patient  Discharge Placement                  Patient to be transferred to facility by: Floor Nursing staff Name of family member notified: patient alert and oriented Patient and family notified of of transfer: 01/25/23  Discharge Plan and Services Additional resources added to the After Visit Summary for     Discharge Planning Services: CM Consult Post Acute Care Choice: Long Term Acute Care (LTAC)                               Social Determinants of Health (SDOH) Interventions SDOH Screenings   Food Insecurity: No Food Insecurity (12/08/2022)  Housing: Patient Declined (12/08/2022)  Transportation Needs: No Transportation Needs (12/08/2022)  Utilities: Not At Risk (12/08/2022)  Financial Resource Strain: Low Risk  (09/27/2022)  Physical Activity: Inactive (07/31/2022)  Tobacco Use: Medium Risk (01/24/2023)     Readmission Risk Interventions    01/10/2023    2:05 PM 01/08/2023    3:01 PM  Readmission Risk Prevention Plan  Transportation Screening Complete Complete  PCP or Specialist Appt within 3-5 Days   Complete  HRI or Home Care Consult  Complete  Social Work Consult for Recovery Care Planning/Counseling  Complete  Palliative Care Screening  Not Applicable  Medication Review Oceanographer) Complete Complete  PCP or Specialist appointment within 3-5 days of discharge Complete   HRI or Home Care Consult Complete   SW Recovery Care/Counseling Consult Complete   Palliative Care Screening Complete   Skilled Nursing Facility Not Applicable

## 2023-01-25 NOTE — Progress Notes (Signed)
Speech Language Pathology Treatment: Dysphagia  Patient Details Name: Travis Beck MRN: [id number] DOB: 19-Nov-1945 Today's Date: 01/25/2023 Time: 8657-8469 SLP Time Calculation (min) (ACUTE ONLY): 15 min  Assessment / Plan / Recommendation Clinical Impression  Patient seen by SLP for skilled treatment focused on dysphagia goals. He was awake, alert, talking to family on speaker phone and telling SLP ,"I feel great!" Currently he is on 2L supplemental oxygen via nasal cannula and per patient, this is the amount he is on at home. (As needed per sister) SLP assessed his toleration of thin liquid PO's with patient taking sips of water, however focus of session was education regarding mechanical soft solids consistencies. SLP provided printed handout describing IDDSI level 7 (easy to chew) diet and reviewed this information with patient and family members who were present via speaker phone. All reported understanding and agreement with soft solids diet which had been recommended follow EGD. Patient did not exhibit any overt s/s aspiration when taking sips of water and per RN, he consumed breakfast meal without observed difficulty. SLP recommending continue on current diet (Dys 3: mechanical soft), thin liquids and no further skilled SLP services needed at this time.    HPI HPI: Patient is a 77 y.o. male with PMH: HTN, DM-2, COPD, recurrent PNA, esophageal dysphagia (has had EGD's with dilation in 2020 and most recently in 2022). He presented to the hospital on 01/06/2023 SOB, Rt flank and back pain with multilobar PNA. He had MBS with SLP on 01/07/23 which reported a primary esophageal based dysphagia and no further intervention needed.  7/1 intubated due to respiratory distress. Extubated 01/10/23. Swallow re-evaluated 7/3 with recs for NPO given tenuous respiratory status. AKI progressing to require CRRT 7/5. GI was consulted as well and per GI note from 01/09/23, with recommendation for outpatient barium  swallow and esophageal manometry due to concern for esophageal dysmotility as well as possible EGD which could be done when his respiratory issues have improved. EGD completed 01/24/23 which found patchy, yellow plaques were found in the entire esophagus but not other endoscopic abnormality to explain patient's c/o dysphagia. Dilation of entire esophagus was performed and soft solids diet recommended.      SLP Plan  Discharge SLP treatment due to (comment);All goals met      Recommendations for follow up therapy are one component of a multi-disciplinary discharge planning process, led by the attending physician.  Recommendations may be updated based on patient status, additional functional criteria and insurance authorization.    Recommendations  Diet recommendations: Dysphagia 3 (mechanical soft);Thin liquid Liquids provided via: Cup;Straw Medication Administration: Crushed with puree Supervision: Patient able to self feed Compensations: Slow rate;Small sips/bites Postural Changes and/or Swallow Maneuvers: Seated upright 90 degrees;Upright 30-60 min after meal                  Oral care BID   None Dysphagia, pharyngoesophageal phase (R13.14)     Discharge SLP treatment due to (comment);All goals met    Angela Nevin, MA, CCC-SLP Speech Therapy

## 2023-01-25 NOTE — Progress Notes (Signed)
Patient ID: Travis Beck, male   DOB: 1946/05/02, 77 y.o.   MRN: [id number] S:  EGD yesterday with esophageal biopsies and dilation Better tolerance of PO today 2.1L UOP, SCr up a smidge 2.98, K 4.7 Hyperglycemic, now on insulin gtt R internal jugular Temp HD cath removed yesterday  O:BP (!) 126/52   Pulse (!) 46   Temp 97.8 F (36.6 C) (Oral)   Resp (!) 24   Ht 5\' 5"  (1.651 m)   Wt 68.6 kg   SpO2 97%   BMI 25.17 kg/m   Intake/Output Summary (Last 24 hours) at 01/25/2023 0859 Last data filed at 01/25/2023 0800 Gross per 24 hour  Intake 430.5 ml  Output 1675 ml  Net -1244.5 ml    Gen: awake in bed, NG in place with TF CVS: RRR Resp: occ rhonchi Abd: +BS, soft, NT/ND Ext: no edema  Recent Labs  Lab 01/19/23 2034 01/20/23 0732 01/21/23 0238 01/22/23 0127 01/23/23 0308 01/24/23 0325 01/24/23 2010 01/24/23 2343  NA 131* 132* 133* 132* 133* 134*  --  131*  K 5.2* 4.0 4.2 4.3 4.2 3.7  --  4.7  CL 95* 93* 97* 97* 96* 99  --  93*  CO2 23 26 27 26 26 25   --  21*  GLUCOSE 207* 191* 149* 183* 111* 83 493* 460*  BUN 105* 51* 67* 81* 89* 92*  --  94*  CREATININE 3.06* 1.89* 2.26* 2.59* 2.65* 2.76*  --  2.98*  ALBUMIN 1.8* 1.8* 1.7* 1.7* 1.8* 1.9*  --  1.8*  CALCIUM 7.4* 7.4* 7.3* 7.3* 7.7* 7.8*  --  7.4*  PHOS 6.6* 3.8 4.0 4.0 4.3 4.9*  --  7.6*   Liver Function Tests: Recent Labs  Lab 01/23/23 0308 01/24/23 0325 01/24/23 2343  ALBUMIN 1.8* 1.9* 1.8*   No results for input(s): "LIPASE", "AMYLASE" in the last 168 hours. No results for input(s): "AMMONIA" in the last 168 hours. CBC: Recent Labs  Lab 01/19/23 2034 01/20/23 0732 01/21/23 0238 01/22/23 0127 01/23/23 0308 01/24/23 1044  WBC 12.9* 13.7* 9.6 8.7 9.1  --   NEUTROABS  --   --   --   --  7.8*  --   HGB 7.3* 8.2* 7.4* 7.4* 7.5* 8.2*  HCT 23.1* 25.4* 22.9* 23.1* 23.3* 25.1*  MCV 89.5 87.9 89.8 91.7 92.1  --   PLT 424* 428* 378 375 355  --    Cardiac Enzymes: No results for input(s):  "CKTOTAL", "CKMB", "CKMBINDEX", "TROPONINI" in the last 168 hours. CBG: Recent Labs  Lab 01/25/23 0413 01/25/23 0519 01/25/23 0623 01/25/23 0719 01/25/23 0821  GLUCAP 244* 174* 162* 124* 123*    Iron Studies: No results for input(s): "IRON", "TIBC", "TRANSFERRIN", "FERRITIN" in the last 72 hours. Studies/Results: No results found.  amLODipine  10 mg Per Tube Daily   arformoterol  15 mcg Nebulization BID   aspirin  81 mg Per Tube Daily   budesonide (PULMICORT) nebulizer solution  0.5 mg Nebulization BID   busPIRone  5 mg Per Tube TID   Chlorhexidine Gluconate Cloth  6 each Topical Daily   clonazepam  0.5 mg Per Tube BID   docusate  100 mg Per Tube BID   feeding supplement  237 mL Oral BID BM   fluconazole  100 mg Per Tube Daily   fluticasone  2 spray Each Nare Daily   lidocaine  1 patch Transdermal Q24H   loratadine  10 mg Per Tube Q48H   methylPREDNISolone (SOLU-MEDROL) injection  50 mg Intravenous Daily   Followed by   Melene Muller ON 01/28/2023] predniSONE  40 mg Per Tube Q breakfast   Followed by   Melene Muller ON 01/31/2023] predniSONE  30 mg Per Tube Q breakfast   Followed by   Melene Muller ON 02/03/2023] predniSONE  20 mg Per Tube Q breakfast   Followed by   Melene Muller ON 02/06/2023] predniSONE  10 mg Per Tube Q breakfast   montelukast  10 mg Per Tube QHS   multivitamin  1 tablet Per Tube QHS   mouth rinse  15 mL Mouth Rinse 4 times per day   pantoprazole (PROTONIX) IV  40 mg Intravenous QHS   polyethylene glycol  17 g Per Tube Daily   pramipexole  0.5 mg Per Tube QHS   pravastatin  20 mg Per Tube QPM   revefenacin  175 mcg Nebulization Daily   saccharomyces boulardii  250 mg Per Tube BID   senna  2 tablet Per Tube QHS   tamsulosin  0.4 mg Oral Daily   traZODone  50 mg Per Tube QHS    BMET    Component Value Date/Time   NA 131 (L) 01/24/2023 2343   K 4.7 01/24/2023 2343   CL 93 (L) 01/24/2023 2343   CO2 21 (L) 01/24/2023 2343   GLUCOSE 460 (H) 01/24/2023 2343   BUN 94 (H)  01/24/2023 2343   CREATININE 2.98 (H) 01/24/2023 2343   CALCIUM 7.4 (L) 01/24/2023 2343   GFRNONAA 21 (L) 01/24/2023 2343   GFRAA >60 12/15/2019 0812   CBC    Component Value Date/Time   WBC 9.1 01/23/2023 0308   RBC 2.53 (L) 01/23/2023 0308   HGB 8.2 (L) 01/24/2023 1044   HCT 25.1 (L) 01/24/2023 1044   PLT 355 01/23/2023 0308   MCV 92.1 01/23/2023 0308   MCH 29.6 01/23/2023 0308   MCHC 32.2 01/23/2023 0308   RDW 16.6 (H) 01/23/2023 0308   LYMPHSABS 0.8 01/23/2023 0308   MONOABS 0.6 01/23/2023 0308   EOSABS 0.0 01/23/2023 0308   BASOSABS 0.0 01/23/2023 0308    Assessment/Plan:  AKI - presumably ischemic ATN in setting of sepsis due to pneumonia and poor po intake. Normal baseline GFR.  Vanco trough was high at 28.  UA with blood and protein but many bacteria and WBC too, low susp for pulm renal and serologies neg ANCA and antiGBM. BUN/Cr continued to climb.  CRRT initiated after right internal jugular catheter placed by PCCM 7/6 which continued until 7/8 PM.  In setting of worsening azotemia did HD 7/13 for clearance, no UF needed UOP excellent, watching daily labs for now, No HD needed today,  Cont daily reassessments. Still thinking he will settle out in time.   Temp cath removed 7/17 OK with redosing lasix as clinically indicated.  Volume looks ok for now.  Great UOP not assisted by diuretic Avoid nephrotoxic medications including NSAIDs and iodinated intravenous contrast exposure unless the latter is absolutely indicated.  Preferred narcotic agents for pain control are hydromorphone, fentanyl, and methadone. Morphine should not be used. Avoid Baclofen and avoid oral sodium phosphate and magnesium citrate based laxatives / bowel preps. Continue strict Input and Output monitoring. Will monitor the patient closely with you and intervene or adjust therapy as indicated by changes in clinical status/labs   AMS - delerium off/on.  Currently ok. Improved Sepsis - due to recurrent  multifocal pneumonia - suspected aspiration.  Improved Acute hypoxic respiratory failure - required intubation now extubated.  Improving.  Iron deficiency anemia - continue to follow, Hb 7s, defer transfusion to primary.  Severe protein malnutrition - alb 1.8.  cortrak and supplements per PCCM. Esophageal dysmotility / dysphagia - GI and speech path following. S/p EGD with biopsies and dilation.  Better this AM?  Arita Miss, MD  [facility]

## 2023-01-26 LAB — CBC WITH DIFFERENTIAL/PLATELET
Abs Immature Granulocytes: 0.04 10*3/uL (ref 0.00–0.07)
Basophils Absolute: 0 10*3/uL (ref 0.0–0.1)
Basophils Relative: 0 %
Eosinophils Absolute: 0 10*3/uL (ref 0.0–0.5)
Eosinophils Relative: 0 %
HCT: 23.6 % — ABNORMAL LOW (ref 39.0–52.0)
Hemoglobin: 7.5 g/dL — ABNORMAL LOW (ref 13.0–17.0)
Immature Granulocytes: 1 %
Lymphocytes Relative: 7 %
Lymphs Abs: 0.6 10*3/uL — ABNORMAL LOW (ref 0.7–4.0)
MCH: 28.6 pg (ref 26.0–34.0)
MCHC: 31.8 g/dL (ref 30.0–36.0)
MCV: 90.1 fL (ref 80.0–100.0)
Monocytes Absolute: 0.3 10*3/uL (ref 0.1–1.0)
Monocytes Relative: 4 %
Neutro Abs: 7.4 10*3/uL (ref 1.7–7.7)
Neutrophils Relative %: 88 %
Platelets: 353 10*3/uL (ref 150–400)
RBC: 2.62 MIL/uL — ABNORMAL LOW (ref 4.22–5.81)
RDW: 16.6 % — ABNORMAL HIGH (ref 11.5–15.5)
WBC: 8.3 10*3/uL (ref 4.0–10.5)
nRBC: 0 % (ref 0.0–0.2)

## 2023-01-26 LAB — PHOSPHORUS: Phosphorus: 7 mg/dL — ABNORMAL HIGH (ref 2.5–4.6)

## 2023-01-26 LAB — BASIC METABOLIC PANEL
Anion gap: 14 (ref 5–15)
BUN: 97 mg/dL — ABNORMAL HIGH (ref 8–23)
CO2: 20 mmol/L — ABNORMAL LOW (ref 22–32)
Calcium: 7.2 mg/dL — ABNORMAL LOW (ref 8.9–10.3)
Chloride: 92 mmol/L — ABNORMAL LOW (ref 98–111)
Creatinine, Ser: 3.33 mg/dL — ABNORMAL HIGH (ref 0.61–1.24)
GFR, Estimated: 18 mL/min — ABNORMAL LOW (ref >60)
Glucose, Bld: 261 mg/dL — ABNORMAL HIGH (ref 70–99)
Potassium: 4.3 mmol/L (ref 3.5–5.1)
Sodium: 126 mmol/L — ABNORMAL LOW (ref 135–145)

## 2023-01-26 LAB — HEMOGLOBIN A1C
Hgb A1c MFr Bld: 6.3 % — ABNORMAL HIGH (ref 4.8–5.6)
Mean Plasma Glucose: 134.11 mg/dL

## 2023-01-26 LAB — MAGNESIUM: Magnesium: 1.8 mg/dL (ref 1.7–2.4)

## 2023-01-26 LAB — ANA W/REFLEX IF POSITIVE: Anti Nuclear Antibody (ANA): NEGATIVE

## 2023-01-27 LAB — CBC WITH DIFFERENTIAL/PLATELET
Abs Immature Granulocytes: 0.03 10*3/uL (ref 0.00–0.07)
Basophils Absolute: 0 10*3/uL (ref 0.0–0.1)
Basophils Relative: 0 %
Eosinophils Absolute: 0 10*3/uL (ref 0.0–0.5)
Eosinophils Relative: 0 %
HCT: 21 % — ABNORMAL LOW (ref 39.0–52.0)
Hemoglobin: 6.9 g/dL — CL (ref 13.0–17.0)
Immature Granulocytes: 0 %
Lymphocytes Relative: 5 %
Lymphs Abs: 0.4 10*3/uL — ABNORMAL LOW (ref 0.7–4.0)
MCH: 29.4 pg (ref 26.0–34.0)
MCHC: 32.9 g/dL (ref 30.0–36.0)
MCV: 89.4 fL (ref 80.0–100.0)
Monocytes Absolute: 0.2 10*3/uL (ref 0.1–1.0)
Monocytes Relative: 3 %
Neutro Abs: 6.8 10*3/uL (ref 1.7–7.7)
Neutrophils Relative %: 92 %
Platelets: 314 10*3/uL (ref 150–400)
RBC: 2.35 MIL/uL — ABNORMAL LOW (ref 4.22–5.81)
RDW: 15.8 % — ABNORMAL HIGH (ref 11.5–15.5)
WBC: 7.4 10*3/uL (ref 4.0–10.5)
nRBC: 0 % (ref 0.0–0.2)

## 2023-01-27 LAB — BASIC METABOLIC PANEL
Anion gap: 15 (ref 5–15)
BUN: 97 mg/dL — ABNORMAL HIGH (ref 8–23)
CO2: 19 mmol/L — ABNORMAL LOW (ref 22–32)
Calcium: 7 mg/dL — ABNORMAL LOW (ref 8.9–10.3)
Chloride: 91 mmol/L — ABNORMAL LOW (ref 98–111)
Creatinine, Ser: 3.14 mg/dL — ABNORMAL HIGH (ref 0.61–1.24)
GFR, Estimated: 19 mL/min — ABNORMAL LOW (ref >60)
Glucose, Bld: 198 mg/dL — ABNORMAL HIGH (ref 70–99)
Potassium: 3.8 mmol/L (ref 3.5–5.1)
Sodium: 125 mmol/L — ABNORMAL LOW (ref 135–145)

## 2023-01-27 LAB — HEMOGLOBIN AND HEMATOCRIT, BLOOD
HCT: 23.5 % — ABNORMAL LOW (ref 39.0–52.0)
Hemoglobin: 7.4 g/dL — ABNORMAL LOW (ref 13.0–17.0)

## 2023-01-27 LAB — SODIUM, URINE, RANDOM: Sodium, Ur: 21 mmol/L

## 2023-01-27 LAB — PHOSPHORUS
Phosphorus: 6.2 mg/dL — ABNORMAL HIGH (ref 2.5–4.6)
Phosphorus: 6.3 mg/dL — ABNORMAL HIGH (ref 2.5–4.6)

## 2023-01-27 LAB — OSMOLALITY, URINE: Osmolality, Ur: 246 mOsm/kg — ABNORMAL LOW (ref 300–900)

## 2023-01-28 LAB — FOLATE: Folate: 27.4 ng/mL (ref >5.9)

## 2023-01-28 LAB — VITAMIN B12: Vitamin B-12: 836 pg/mL (ref 180–914)

## 2023-01-28 LAB — CBC WITH DIFFERENTIAL/PLATELET
Abs Immature Granulocytes: 0.05 10*3/uL (ref 0.00–0.07)
Basophils Absolute: 0 10*3/uL (ref 0.0–0.1)
Basophils Relative: 0 %
Eosinophils Absolute: 0 10*3/uL (ref 0.0–0.5)
Eosinophils Relative: 0 %
HCT: 23.1 % — ABNORMAL LOW (ref 39.0–52.0)
Hemoglobin: 7.6 g/dL — ABNORMAL LOW (ref 13.0–17.0)
Immature Granulocytes: 1 %
Lymphocytes Relative: 2 %
Lymphs Abs: 0.2 10*3/uL — ABNORMAL LOW (ref 0.7–4.0)
MCH: 29.8 pg (ref 26.0–34.0)
MCHC: 32.9 g/dL (ref 30.0–36.0)
MCV: 90.6 fL (ref 80.0–100.0)
Monocytes Absolute: 0.1 10*3/uL (ref 0.1–1.0)
Monocytes Relative: 1 %
Neutro Abs: 8.7 10*3/uL — ABNORMAL HIGH (ref 1.7–7.7)
Neutrophils Relative %: 96 %
Platelets: 296 10*3/uL (ref 150–400)
RBC: 2.55 MIL/uL — ABNORMAL LOW (ref 4.22–5.81)
RDW: 16.1 % — ABNORMAL HIGH (ref 11.5–15.5)
WBC: 9 10*3/uL (ref 4.0–10.5)
nRBC: 0 % (ref 0.0–0.2)

## 2023-01-28 LAB — BASIC METABOLIC PANEL
Anion gap: 11 (ref 5–15)
BUN: 98 mg/dL — ABNORMAL HIGH (ref 8–23)
CO2: 20 mmol/L — ABNORMAL LOW (ref 22–32)
Calcium: 7.3 mg/dL — ABNORMAL LOW (ref 8.9–10.3)
Chloride: 96 mmol/L — ABNORMAL LOW (ref 98–111)
Creatinine, Ser: 3.08 mg/dL — ABNORMAL HIGH (ref 0.61–1.24)
GFR, Estimated: 20 mL/min — ABNORMAL LOW (ref >60)
Glucose, Bld: 319 mg/dL — ABNORMAL HIGH (ref 70–99)
Potassium: 4 mmol/L (ref 3.5–5.1)
Sodium: 127 mmol/L — ABNORMAL LOW (ref 135–145)

## 2023-01-28 LAB — IRON AND TIBC
Iron: 35 ug/dL — ABNORMAL LOW (ref 45–182)
Saturation Ratios: 14 % — ABNORMAL LOW (ref 17.9–39.5)
TIBC: 252 ug/dL (ref 250–450)
UIBC: 217 ug/dL

## 2023-01-28 LAB — PHOSPHORUS: Phosphorus: 4.7 mg/dL — ABNORMAL HIGH (ref 2.5–4.6)

## 2023-01-28 LAB — FERRITIN: Ferritin: 566 ng/mL — ABNORMAL HIGH (ref 24–336)

## 2023-01-28 LAB — OSMOLALITY: Osmolality: 316 mOsm/kg — ABNORMAL HIGH (ref 275–295)

## 2023-01-29 ENCOUNTER — Other Ambulatory Visit (HOSPITAL_COMMUNITY): Payer: Medicare Other

## 2023-01-29 ENCOUNTER — Encounter (HOSPITAL_BASED_OUTPATIENT_CLINIC_OR_DEPARTMENT_OTHER): Payer: Medicare Other

## 2023-01-29 DIAGNOSIS — M7989 Other specified soft tissue disorders: Secondary | ICD-10-CM

## 2023-01-29 LAB — BASIC METABOLIC PANEL
Anion gap: 14 (ref 5–15)
BUN: 95 mg/dL — ABNORMAL HIGH (ref 8–23)
CO2: 19 mmol/L — ABNORMAL LOW (ref 22–32)
Calcium: 7.6 mg/dL — ABNORMAL LOW (ref 8.9–10.3)
Chloride: 96 mmol/L — ABNORMAL LOW (ref 98–111)
Creatinine, Ser: 2.98 mg/dL — ABNORMAL HIGH (ref 0.61–1.24)
GFR, Estimated: 21 mL/min — ABNORMAL LOW (ref >60)
Glucose, Bld: 183 mg/dL — ABNORMAL HIGH (ref 70–99)
Potassium: 4 mmol/L (ref 3.5–5.1)
Sodium: 129 mmol/L — ABNORMAL LOW (ref 135–145)

## 2023-01-29 LAB — PHOSPHORUS: Phosphorus: 4.5 mg/dL (ref 2.5–4.6)

## 2023-01-29 LAB — CBC
HCT: 23 % — ABNORMAL LOW (ref 39.0–52.0)
Hemoglobin: 7.4 g/dL — ABNORMAL LOW (ref 13.0–17.0)
MCH: 29.2 pg (ref 26.0–34.0)
MCHC: 32.2 g/dL (ref 30.0–36.0)
MCV: 90.9 fL (ref 80.0–100.0)
Platelets: 298 10*3/uL (ref 150–400)
RBC: 2.53 MIL/uL — ABNORMAL LOW (ref 4.22–5.81)
RDW: 16.1 % — ABNORMAL HIGH (ref 11.5–15.5)
WBC: 7.3 10*3/uL (ref 4.0–10.5)
nRBC: 0 % (ref 0.0–0.2)

## 2023-01-29 LAB — MAGNESIUM
Magnesium: 1.5 mg/dL — ABNORMAL LOW (ref 1.7–2.4)
Magnesium: 1.9 mg/dL (ref 1.7–2.4)

## 2023-01-29 NOTE — Progress Notes (Signed)
VASCULAR LAB    Right upper extremity venous duplex has been performed.  See CV proc for preliminary results.  Gave report to charge nurse  Sherren Kerns, RVT 01/29/2023, 3:01 PM

## 2023-01-30 LAB — URINE CULTURE

## 2023-01-31 LAB — URINE CULTURE: Culture: 40000 — AB

## 2023-02-01 LAB — URINE CULTURE

## 2023-02-02 ENCOUNTER — Other Ambulatory Visit (HOSPITAL_COMMUNITY): Payer: Medicare Other

## 2023-02-02 LAB — ECHOCARDIOGRAM LIMITED
Area-P 1/2: 5.02 cm2
MV M vel: 4.68 m/s
MV Peak grad: 87.6 mmHg
S' Lateral: 3.7 cm

## 2023-02-02 NOTE — Progress Notes (Signed)
  Echocardiogram 2D Echocardiogram has been performed.  Maren Reamer 02/02/2023, 2:06 PM

## 2023-02-03 LAB — CBC
HCT: 20.6 % — ABNORMAL LOW (ref 39.0–52.0)
Hemoglobin: 6.9 g/dL — CL (ref 13.0–17.0)
MCH: 30.4 pg (ref 26.0–34.0)
MCHC: 33.5 g/dL (ref 30.0–36.0)
MCV: 90.7 fL (ref 80.0–100.0)
Platelets: 292 10*3/uL (ref 150–400)
RBC: 2.27 MIL/uL — ABNORMAL LOW (ref 4.22–5.81)
RDW: 17.2 % — ABNORMAL HIGH (ref 11.5–15.5)
WBC: 6.8 10*3/uL (ref 4.0–10.5)
nRBC: 0 % (ref 0.0–0.2)

## 2023-02-03 LAB — PREPARE RBC (CROSSMATCH)

## 2023-02-03 LAB — HEMOGLOBIN AND HEMATOCRIT, BLOOD
HCT: 26.6 % — ABNORMAL LOW (ref 39.0–52.0)
Hemoglobin: 8.8 g/dL — ABNORMAL LOW (ref 13.0–17.0)

## 2023-02-04 LAB — BPAM RBC
Blood Product Expiration Date: 202408272359
ISSUE DATE / TIME: 202407271213
Unit Type and Rh: 7300

## 2023-02-04 LAB — CBC
HCT: 22.6 % — ABNORMAL LOW (ref 39.0–52.0)
Hemoglobin: 7.4 g/dL — ABNORMAL LOW (ref 13.0–17.0)
MCH: 28.6 pg (ref 26.0–34.0)
MCHC: 32.7 g/dL (ref 30.0–36.0)
MCV: 87.3 fL (ref 80.0–100.0)
Platelets: 265 10*3/uL (ref 150–400)
RBC: 2.59 MIL/uL — ABNORMAL LOW (ref 4.22–5.81)
RDW: 17 % — ABNORMAL HIGH (ref 11.5–15.5)
WBC: 5.2 10*3/uL (ref 4.0–10.5)
nRBC: 0 % (ref 0.0–0.2)

## 2023-02-04 LAB — TYPE AND SCREEN
ABO/RH(D): B POS
Antibody Screen: NEGATIVE
Unit division: 0

## 2023-02-04 LAB — OCCULT BLOOD X 1 CARD TO LAB, STOOL: Fecal Occult Bld: NEGATIVE

## 2023-02-05 ENCOUNTER — Telehealth: Payer: Self-pay | Admitting: Internal Medicine

## 2023-02-05 ENCOUNTER — Telehealth: Payer: Self-pay | Admitting: Cardiology

## 2023-02-05 NOTE — Telephone Encounter (Signed)
Patient's niece is requesting to switch patient from Dr. Diona Browner to Dr. Antoine Poche. Please advise.

## 2023-02-05 NOTE — Telephone Encounter (Signed)
Patient is requesting to switch providers from Dr. Isaiah Serge to Dr. Sherene Sires in [place].  Patient lives closer to [place] and would like to keep appointments closer to home.   Message routed to Dr. Isaiah Serge and Dr. Sherene Sires to advise

## 2023-02-05 NOTE — Telephone Encounter (Signed)
Fine with me, needs 30 min  ov with all meds in hand

## 2023-02-06 DIAGNOSIS — R0602 Shortness of breath: Secondary | ICD-10-CM | POA: Diagnosis not present

## 2023-02-06 DIAGNOSIS — I10 Essential (primary) hypertension: Secondary | ICD-10-CM | POA: Diagnosis not present

## 2023-02-06 DIAGNOSIS — J439 Emphysema, unspecified: Secondary | ICD-10-CM | POA: Diagnosis not present

## 2023-02-06 DIAGNOSIS — I130 Hypertensive heart and chronic kidney disease with heart failure and stage 1 through stage 4 chronic kidney disease, or unspecified chronic kidney disease: Secondary | ICD-10-CM | POA: Diagnosis not present

## 2023-02-06 DIAGNOSIS — Z299 Encounter for prophylactic measures, unspecified: Secondary | ICD-10-CM | POA: Diagnosis not present

## 2023-02-06 DIAGNOSIS — I82463 Acute embolism and thrombosis of calf muscular vein, bilateral: Secondary | ICD-10-CM | POA: Diagnosis not present

## 2023-02-06 DIAGNOSIS — M6281 Muscle weakness (generalized): Secondary | ICD-10-CM | POA: Diagnosis not present

## 2023-02-06 DIAGNOSIS — B3781 Candidal esophagitis: Secondary | ICD-10-CM | POA: Diagnosis not present

## 2023-02-06 DIAGNOSIS — G4700 Insomnia, unspecified: Secondary | ICD-10-CM | POA: Diagnosis not present

## 2023-02-06 DIAGNOSIS — Z9911 Dependence on respirator [ventilator] status: Secondary | ICD-10-CM | POA: Diagnosis not present

## 2023-02-06 DIAGNOSIS — E1140 Type 2 diabetes mellitus with diabetic neuropathy, unspecified: Secondary | ICD-10-CM | POA: Diagnosis not present

## 2023-02-06 DIAGNOSIS — J189 Pneumonia, unspecified organism: Secondary | ICD-10-CM | POA: Diagnosis not present

## 2023-02-06 DIAGNOSIS — N390 Urinary tract infection, site not specified: Secondary | ICD-10-CM | POA: Diagnosis not present

## 2023-02-06 DIAGNOSIS — J9600 Acute respiratory failure, unspecified whether with hypoxia or hypercapnia: Secondary | ICD-10-CM | POA: Diagnosis not present

## 2023-02-06 DIAGNOSIS — B961 Klebsiella pneumoniae [K. pneumoniae] as the cause of diseases classified elsewhere: Secondary | ICD-10-CM | POA: Diagnosis not present

## 2023-02-06 DIAGNOSIS — D696 Thrombocytopenia, unspecified: Secondary | ICD-10-CM | POA: Diagnosis not present

## 2023-02-06 DIAGNOSIS — R1310 Dysphagia, unspecified: Secondary | ICD-10-CM | POA: Diagnosis not present

## 2023-02-06 DIAGNOSIS — F419 Anxiety disorder, unspecified: Secondary | ICD-10-CM | POA: Diagnosis not present

## 2023-02-06 DIAGNOSIS — E785 Hyperlipidemia, unspecified: Secondary | ICD-10-CM | POA: Diagnosis not present

## 2023-02-06 DIAGNOSIS — A419 Sepsis, unspecified organism: Secondary | ICD-10-CM | POA: Diagnosis not present

## 2023-02-06 DIAGNOSIS — J9601 Acute respiratory failure with hypoxia: Secondary | ICD-10-CM | POA: Diagnosis not present

## 2023-02-06 DIAGNOSIS — E872 Acidosis, unspecified: Secondary | ICD-10-CM | POA: Diagnosis not present

## 2023-02-06 DIAGNOSIS — I871 Compression of vein: Secondary | ICD-10-CM | POA: Diagnosis not present

## 2023-02-06 DIAGNOSIS — J440 Chronic obstructive pulmonary disease with acute lower respiratory infection: Secondary | ICD-10-CM | POA: Diagnosis not present

## 2023-02-06 DIAGNOSIS — R54 Age-related physical debility: Secondary | ICD-10-CM | POA: Diagnosis not present

## 2023-02-06 DIAGNOSIS — F29 Unspecified psychosis not due to a substance or known physiological condition: Secondary | ICD-10-CM | POA: Diagnosis not present

## 2023-02-06 DIAGNOSIS — E871 Hypo-osmolality and hyponatremia: Secondary | ICD-10-CM | POA: Diagnosis not present

## 2023-02-06 DIAGNOSIS — R600 Localized edema: Secondary | ICD-10-CM | POA: Diagnosis not present

## 2023-02-06 DIAGNOSIS — D509 Iron deficiency anemia, unspecified: Secondary | ICD-10-CM | POA: Diagnosis not present

## 2023-02-06 DIAGNOSIS — E11649 Type 2 diabetes mellitus with hypoglycemia without coma: Secondary | ICD-10-CM | POA: Diagnosis not present

## 2023-02-06 DIAGNOSIS — K219 Gastro-esophageal reflux disease without esophagitis: Secondary | ICD-10-CM | POA: Diagnosis not present

## 2023-02-06 DIAGNOSIS — E1165 Type 2 diabetes mellitus with hyperglycemia: Secondary | ICD-10-CM | POA: Diagnosis not present

## 2023-02-06 DIAGNOSIS — N184 Chronic kidney disease, stage 4 (severe): Secondary | ICD-10-CM | POA: Diagnosis not present

## 2023-02-06 DIAGNOSIS — I5023 Acute on chronic systolic (congestive) heart failure: Secondary | ICD-10-CM | POA: Diagnosis not present

## 2023-02-06 DIAGNOSIS — Z1152 Encounter for screening for COVID-19: Secondary | ICD-10-CM | POA: Diagnosis not present

## 2023-02-06 DIAGNOSIS — I501 Left ventricular failure: Secondary | ICD-10-CM | POA: Diagnosis not present

## 2023-02-06 DIAGNOSIS — E1122 Type 2 diabetes mellitus with diabetic chronic kidney disease: Secondary | ICD-10-CM | POA: Diagnosis not present

## 2023-02-06 DIAGNOSIS — Z743 Need for continuous supervision: Secondary | ICD-10-CM | POA: Diagnosis not present

## 2023-02-06 DIAGNOSIS — F339 Major depressive disorder, recurrent, unspecified: Secondary | ICD-10-CM | POA: Diagnosis not present

## 2023-02-06 DIAGNOSIS — N179 Acute kidney failure, unspecified: Secondary | ICD-10-CM | POA: Diagnosis not present

## 2023-02-06 DIAGNOSIS — F411 Generalized anxiety disorder: Secondary | ICD-10-CM | POA: Diagnosis not present

## 2023-02-06 DIAGNOSIS — A4102 Sepsis due to Methicillin resistant Staphylococcus aureus: Secondary | ICD-10-CM | POA: Diagnosis not present

## 2023-02-06 DIAGNOSIS — R5383 Other fatigue: Secondary | ICD-10-CM | POA: Diagnosis not present

## 2023-02-06 DIAGNOSIS — J95851 Ventilator associated pneumonia: Secondary | ICD-10-CM | POA: Diagnosis not present

## 2023-02-06 DIAGNOSIS — J15212 Pneumonia due to Methicillin resistant Staphylococcus aureus: Secondary | ICD-10-CM | POA: Diagnosis not present

## 2023-02-06 DIAGNOSIS — J810 Acute pulmonary edema: Secondary | ICD-10-CM | POA: Diagnosis not present

## 2023-02-06 NOTE — Telephone Encounter (Signed)
Ok with me 

## 2023-02-07 DIAGNOSIS — I10 Essential (primary) hypertension: Secondary | ICD-10-CM | POA: Diagnosis not present

## 2023-02-07 DIAGNOSIS — Z299 Encounter for prophylactic measures, unspecified: Secondary | ICD-10-CM | POA: Diagnosis not present

## 2023-02-07 DIAGNOSIS — E1165 Type 2 diabetes mellitus with hyperglycemia: Secondary | ICD-10-CM | POA: Diagnosis not present

## 2023-02-07 DIAGNOSIS — A4102 Sepsis due to Methicillin resistant Staphylococcus aureus: Secondary | ICD-10-CM | POA: Diagnosis not present

## 2023-02-07 DIAGNOSIS — J439 Emphysema, unspecified: Secondary | ICD-10-CM | POA: Diagnosis not present

## 2023-02-07 NOTE — Telephone Encounter (Signed)
Noted  

## 2023-02-08 DIAGNOSIS — G8929 Other chronic pain: Secondary | ICD-10-CM | POA: Diagnosis not present

## 2023-02-08 DIAGNOSIS — Z7409 Other reduced mobility: Secondary | ICD-10-CM | POA: Diagnosis not present

## 2023-02-08 DIAGNOSIS — J810 Acute pulmonary edema: Secondary | ICD-10-CM | POA: Diagnosis not present

## 2023-02-08 DIAGNOSIS — J9600 Acute respiratory failure, unspecified whether with hypoxia or hypercapnia: Secondary | ICD-10-CM | POA: Diagnosis not present

## 2023-02-08 DIAGNOSIS — J984 Other disorders of lung: Secondary | ICD-10-CM | POA: Diagnosis not present

## 2023-02-08 DIAGNOSIS — F419 Anxiety disorder, unspecified: Secondary | ICD-10-CM | POA: Diagnosis not present

## 2023-02-08 DIAGNOSIS — R509 Fever, unspecified: Secondary | ICD-10-CM | POA: Diagnosis not present

## 2023-02-08 DIAGNOSIS — N189 Chronic kidney disease, unspecified: Secondary | ICD-10-CM | POA: Diagnosis not present

## 2023-02-08 DIAGNOSIS — R4182 Altered mental status, unspecified: Secondary | ICD-10-CM | POA: Diagnosis not present

## 2023-02-08 DIAGNOSIS — J9690 Respiratory failure, unspecified, unspecified whether with hypoxia or hypercapnia: Secondary | ICD-10-CM | POA: Diagnosis not present

## 2023-02-08 DIAGNOSIS — I130 Hypertensive heart and chronic kidney disease with heart failure and stage 1 through stage 4 chronic kidney disease, or unspecified chronic kidney disease: Secondary | ICD-10-CM | POA: Diagnosis not present

## 2023-02-08 DIAGNOSIS — B3781 Candidal esophagitis: Secondary | ICD-10-CM | POA: Diagnosis not present

## 2023-02-08 DIAGNOSIS — E1122 Type 2 diabetes mellitus with diabetic chronic kidney disease: Secondary | ICD-10-CM | POA: Diagnosis not present

## 2023-02-08 DIAGNOSIS — R042 Hemoptysis: Secondary | ICD-10-CM | POA: Diagnosis not present

## 2023-02-08 DIAGNOSIS — Z4901 Encounter for fitting and adjustment of extracorporeal dialysis catheter: Secondary | ICD-10-CM | POA: Diagnosis not present

## 2023-02-08 DIAGNOSIS — J449 Chronic obstructive pulmonary disease, unspecified: Secondary | ICD-10-CM | POA: Diagnosis not present

## 2023-02-08 DIAGNOSIS — Z7901 Long term (current) use of anticoagulants: Secondary | ICD-10-CM | POA: Diagnosis not present

## 2023-02-08 DIAGNOSIS — K219 Gastro-esophageal reflux disease without esophagitis: Secondary | ICD-10-CM | POA: Diagnosis not present

## 2023-02-08 DIAGNOSIS — J95851 Ventilator associated pneumonia: Secondary | ICD-10-CM | POA: Diagnosis not present

## 2023-02-08 DIAGNOSIS — R54 Age-related physical debility: Secondary | ICD-10-CM | POA: Diagnosis present

## 2023-02-08 DIAGNOSIS — J189 Pneumonia, unspecified organism: Secondary | ICD-10-CM | POA: Diagnosis not present

## 2023-02-08 DIAGNOSIS — D509 Iron deficiency anemia, unspecified: Secondary | ICD-10-CM | POA: Diagnosis present

## 2023-02-08 DIAGNOSIS — N184 Chronic kidney disease, stage 4 (severe): Secondary | ICD-10-CM | POA: Diagnosis not present

## 2023-02-08 DIAGNOSIS — E872 Acidosis, unspecified: Secondary | ICD-10-CM | POA: Diagnosis not present

## 2023-02-08 DIAGNOSIS — I129 Hypertensive chronic kidney disease with stage 1 through stage 4 chronic kidney disease, or unspecified chronic kidney disease: Secondary | ICD-10-CM | POA: Diagnosis not present

## 2023-02-08 DIAGNOSIS — I82493 Acute embolism and thrombosis of other specified deep vein of lower extremity, bilateral: Secondary | ICD-10-CM | POA: Diagnosis not present

## 2023-02-08 DIAGNOSIS — E8770 Fluid overload, unspecified: Secondary | ICD-10-CM | POA: Diagnosis not present

## 2023-02-08 DIAGNOSIS — D631 Anemia in chronic kidney disease: Secondary | ICD-10-CM | POA: Diagnosis not present

## 2023-02-08 DIAGNOSIS — Z79891 Long term (current) use of opiate analgesic: Secondary | ICD-10-CM | POA: Diagnosis not present

## 2023-02-08 DIAGNOSIS — I5022 Chronic systolic (congestive) heart failure: Secondary | ICD-10-CM | POA: Diagnosis not present

## 2023-02-08 DIAGNOSIS — J811 Chronic pulmonary edema: Secondary | ICD-10-CM | POA: Diagnosis not present

## 2023-02-08 DIAGNOSIS — D6489 Other specified anemias: Secondary | ICD-10-CM | POA: Diagnosis not present

## 2023-02-08 DIAGNOSIS — I5020 Unspecified systolic (congestive) heart failure: Secondary | ICD-10-CM | POA: Diagnosis not present

## 2023-02-08 DIAGNOSIS — F05 Delirium due to known physiological condition: Secondary | ICD-10-CM | POA: Diagnosis not present

## 2023-02-08 DIAGNOSIS — J15212 Pneumonia due to Methicillin resistant Staphylococcus aureus: Secondary | ICD-10-CM | POA: Diagnosis present

## 2023-02-08 DIAGNOSIS — G4700 Insomnia, unspecified: Secondary | ICD-10-CM | POA: Diagnosis not present

## 2023-02-08 DIAGNOSIS — M6281 Muscle weakness (generalized): Secondary | ICD-10-CM | POA: Diagnosis not present

## 2023-02-08 DIAGNOSIS — I5082 Biventricular heart failure: Secondary | ICD-10-CM | POA: Diagnosis not present

## 2023-02-08 DIAGNOSIS — I517 Cardiomegaly: Secondary | ICD-10-CM | POA: Diagnosis not present

## 2023-02-08 DIAGNOSIS — Z1152 Encounter for screening for COVID-19: Secondary | ICD-10-CM | POA: Diagnosis not present

## 2023-02-08 DIAGNOSIS — D649 Anemia, unspecified: Secondary | ICD-10-CM | POA: Diagnosis not present

## 2023-02-08 DIAGNOSIS — K5900 Constipation, unspecified: Secondary | ICD-10-CM | POA: Diagnosis not present

## 2023-02-08 DIAGNOSIS — I132 Hypertensive heart and chronic kidney disease with heart failure and with stage 5 chronic kidney disease, or end stage renal disease: Secondary | ICD-10-CM | POA: Diagnosis not present

## 2023-02-08 DIAGNOSIS — Z789 Other specified health status: Secondary | ICD-10-CM | POA: Diagnosis not present

## 2023-02-08 DIAGNOSIS — I82501 Chronic embolism and thrombosis of unspecified deep veins of right lower extremity: Secondary | ICD-10-CM | POA: Diagnosis not present

## 2023-02-08 DIAGNOSIS — I501 Left ventricular failure: Secondary | ICD-10-CM | POA: Diagnosis not present

## 2023-02-08 DIAGNOSIS — R1313 Dysphagia, pharyngeal phase: Secondary | ICD-10-CM | POA: Diagnosis not present

## 2023-02-08 DIAGNOSIS — I5023 Acute on chronic systolic (congestive) heart failure: Secondary | ICD-10-CM | POA: Diagnosis present

## 2023-02-08 DIAGNOSIS — R0602 Shortness of breath: Secondary | ICD-10-CM | POA: Diagnosis not present

## 2023-02-08 DIAGNOSIS — I871 Compression of vein: Secondary | ICD-10-CM | POA: Diagnosis not present

## 2023-02-08 DIAGNOSIS — R1312 Dysphagia, oropharyngeal phase: Secondary | ICD-10-CM | POA: Diagnosis not present

## 2023-02-08 DIAGNOSIS — R1310 Dysphagia, unspecified: Secondary | ICD-10-CM | POA: Diagnosis not present

## 2023-02-08 DIAGNOSIS — I82463 Acute embolism and thrombosis of calf muscular vein, bilateral: Secondary | ICD-10-CM | POA: Diagnosis not present

## 2023-02-08 DIAGNOSIS — R633 Feeding difficulties, unspecified: Secondary | ICD-10-CM | POA: Diagnosis not present

## 2023-02-08 DIAGNOSIS — I82403 Acute embolism and thrombosis of unspecified deep veins of lower extremity, bilateral: Secondary | ICD-10-CM | POA: Diagnosis not present

## 2023-02-08 DIAGNOSIS — J9601 Acute respiratory failure with hypoxia: Secondary | ICD-10-CM | POA: Diagnosis not present

## 2023-02-08 DIAGNOSIS — J4489 Other specified chronic obstructive pulmonary disease: Secondary | ICD-10-CM | POA: Diagnosis not present

## 2023-02-08 DIAGNOSIS — E871 Hypo-osmolality and hyponatremia: Secondary | ICD-10-CM | POA: Diagnosis not present

## 2023-02-08 DIAGNOSIS — G8918 Other acute postprocedural pain: Secondary | ICD-10-CM | POA: Diagnosis not present

## 2023-02-08 DIAGNOSIS — J440 Chronic obstructive pulmonary disease with acute lower respiratory infection: Secondary | ICD-10-CM | POA: Diagnosis not present

## 2023-02-08 DIAGNOSIS — R040 Epistaxis: Secondary | ICD-10-CM | POA: Diagnosis not present

## 2023-02-08 DIAGNOSIS — J690 Pneumonitis due to inhalation of food and vomit: Secondary | ICD-10-CM | POA: Diagnosis not present

## 2023-02-08 DIAGNOSIS — N179 Acute kidney failure, unspecified: Secondary | ICD-10-CM | POA: Diagnosis not present

## 2023-02-08 DIAGNOSIS — J90 Pleural effusion, not elsewhere classified: Secondary | ICD-10-CM | POA: Diagnosis not present

## 2023-02-08 DIAGNOSIS — R0600 Dyspnea, unspecified: Secondary | ICD-10-CM | POA: Diagnosis not present

## 2023-02-08 DIAGNOSIS — D696 Thrombocytopenia, unspecified: Secondary | ICD-10-CM | POA: Diagnosis present

## 2023-02-08 DIAGNOSIS — A419 Sepsis, unspecified organism: Secondary | ICD-10-CM | POA: Diagnosis present

## 2023-02-08 DIAGNOSIS — R918 Other nonspecific abnormal finding of lung field: Secondary | ICD-10-CM | POA: Diagnosis not present

## 2023-02-08 DIAGNOSIS — I824Z2 Acute embolism and thrombosis of unspecified deep veins of left distal lower extremity: Secondary | ICD-10-CM | POA: Diagnosis not present

## 2023-02-08 DIAGNOSIS — Z9911 Dependence on respirator [ventilator] status: Secondary | ICD-10-CM | POA: Diagnosis not present

## 2023-02-08 DIAGNOSIS — M545 Low back pain, unspecified: Secondary | ICD-10-CM | POA: Diagnosis not present

## 2023-02-08 DIAGNOSIS — Z992 Dependence on renal dialysis: Secondary | ICD-10-CM | POA: Diagnosis not present

## 2023-02-08 DIAGNOSIS — N281 Cyst of kidney, acquired: Secondary | ICD-10-CM | POA: Diagnosis not present

## 2023-02-08 DIAGNOSIS — Z4682 Encounter for fitting and adjustment of non-vascular catheter: Secondary | ICD-10-CM | POA: Diagnosis not present

## 2023-02-08 DIAGNOSIS — E11649 Type 2 diabetes mellitus with hypoglycemia without coma: Secondary | ICD-10-CM | POA: Diagnosis not present

## 2023-02-08 DIAGNOSIS — R0902 Hypoxemia: Secondary | ICD-10-CM | POA: Diagnosis not present

## 2023-02-08 DIAGNOSIS — Z452 Encounter for adjustment and management of vascular access device: Secondary | ICD-10-CM | POA: Diagnosis not present

## 2023-02-08 DIAGNOSIS — T17908A Unspecified foreign body in respiratory tract, part unspecified causing other injury, initial encounter: Secondary | ICD-10-CM | POA: Diagnosis not present

## 2023-02-08 DIAGNOSIS — Z431 Encounter for attention to gastrostomy: Secondary | ICD-10-CM | POA: Diagnosis not present

## 2023-02-08 DIAGNOSIS — R5381 Other malaise: Secondary | ICD-10-CM | POA: Diagnosis not present

## 2023-02-08 DIAGNOSIS — N186 End stage renal disease: Secondary | ICD-10-CM | POA: Diagnosis not present

## 2023-02-08 DIAGNOSIS — I110 Hypertensive heart disease with heart failure: Secondary | ICD-10-CM | POA: Diagnosis not present

## 2023-02-08 DIAGNOSIS — E8729 Other acidosis: Secondary | ICD-10-CM | POA: Diagnosis not present

## 2023-02-08 DIAGNOSIS — R059 Cough, unspecified: Secondary | ICD-10-CM | POA: Diagnosis not present

## 2023-02-08 DIAGNOSIS — N390 Urinary tract infection, site not specified: Secondary | ICD-10-CM | POA: Diagnosis not present

## 2023-02-08 DIAGNOSIS — R051 Acute cough: Secondary | ICD-10-CM | POA: Diagnosis not present

## 2023-02-08 DIAGNOSIS — Z931 Gastrostomy status: Secondary | ICD-10-CM | POA: Diagnosis not present

## 2023-02-09 DIAGNOSIS — N184 Chronic kidney disease, stage 4 (severe): Secondary | ICD-10-CM | POA: Diagnosis not present

## 2023-02-09 DIAGNOSIS — J189 Pneumonia, unspecified organism: Secondary | ICD-10-CM | POA: Diagnosis not present

## 2023-02-09 DIAGNOSIS — J4489 Other specified chronic obstructive pulmonary disease: Secondary | ICD-10-CM | POA: Diagnosis not present

## 2023-02-09 DIAGNOSIS — R918 Other nonspecific abnormal finding of lung field: Secondary | ICD-10-CM | POA: Diagnosis not present

## 2023-02-09 DIAGNOSIS — J984 Other disorders of lung: Secondary | ICD-10-CM | POA: Diagnosis not present

## 2023-02-09 DIAGNOSIS — B3781 Candidal esophagitis: Secondary | ICD-10-CM | POA: Diagnosis not present

## 2023-02-10 DIAGNOSIS — J449 Chronic obstructive pulmonary disease, unspecified: Secondary | ICD-10-CM | POA: Diagnosis not present

## 2023-02-10 DIAGNOSIS — R54 Age-related physical debility: Secondary | ICD-10-CM | POA: Diagnosis present

## 2023-02-10 DIAGNOSIS — Z9911 Dependence on respirator [ventilator] status: Secondary | ICD-10-CM | POA: Diagnosis not present

## 2023-02-10 DIAGNOSIS — Z79891 Long term (current) use of opiate analgesic: Secondary | ICD-10-CM | POA: Diagnosis not present

## 2023-02-10 DIAGNOSIS — R918 Other nonspecific abnormal finding of lung field: Secondary | ICD-10-CM | POA: Diagnosis not present

## 2023-02-10 DIAGNOSIS — E8729 Other acidosis: Secondary | ICD-10-CM | POA: Diagnosis not present

## 2023-02-10 DIAGNOSIS — R5381 Other malaise: Secondary | ICD-10-CM | POA: Diagnosis not present

## 2023-02-10 DIAGNOSIS — I5023 Acute on chronic systolic (congestive) heart failure: Secondary | ICD-10-CM | POA: Diagnosis present

## 2023-02-10 DIAGNOSIS — R0600 Dyspnea, unspecified: Secondary | ICD-10-CM | POA: Diagnosis not present

## 2023-02-10 DIAGNOSIS — I5082 Biventricular heart failure: Secondary | ICD-10-CM | POA: Diagnosis not present

## 2023-02-10 DIAGNOSIS — I132 Hypertensive heart and chronic kidney disease with heart failure and with stage 5 chronic kidney disease, or end stage renal disease: Secondary | ICD-10-CM | POA: Diagnosis not present

## 2023-02-10 DIAGNOSIS — Z4682 Encounter for fitting and adjustment of non-vascular catheter: Secondary | ICD-10-CM | POA: Diagnosis not present

## 2023-02-10 DIAGNOSIS — T17908A Unspecified foreign body in respiratory tract, part unspecified causing other injury, initial encounter: Secondary | ICD-10-CM | POA: Diagnosis not present

## 2023-02-10 DIAGNOSIS — J95851 Ventilator associated pneumonia: Secondary | ICD-10-CM | POA: Diagnosis not present

## 2023-02-10 DIAGNOSIS — R051 Acute cough: Secondary | ICD-10-CM | POA: Diagnosis not present

## 2023-02-10 DIAGNOSIS — J9601 Acute respiratory failure with hypoxia: Secondary | ICD-10-CM | POA: Diagnosis not present

## 2023-02-10 DIAGNOSIS — Z7901 Long term (current) use of anticoagulants: Secondary | ICD-10-CM | POA: Diagnosis not present

## 2023-02-10 DIAGNOSIS — M545 Low back pain, unspecified: Secondary | ICD-10-CM | POA: Diagnosis not present

## 2023-02-10 DIAGNOSIS — Z789 Other specified health status: Secondary | ICD-10-CM | POA: Diagnosis not present

## 2023-02-10 DIAGNOSIS — R0602 Shortness of breath: Secondary | ICD-10-CM | POA: Diagnosis not present

## 2023-02-10 DIAGNOSIS — Z4901 Encounter for fitting and adjustment of extracorporeal dialysis catheter: Secondary | ICD-10-CM | POA: Diagnosis not present

## 2023-02-10 DIAGNOSIS — B3781 Candidal esophagitis: Secondary | ICD-10-CM | POA: Diagnosis present

## 2023-02-10 DIAGNOSIS — Z452 Encounter for adjustment and management of vascular access device: Secondary | ICD-10-CM | POA: Diagnosis not present

## 2023-02-10 DIAGNOSIS — R1313 Dysphagia, pharyngeal phase: Secondary | ICD-10-CM | POA: Diagnosis not present

## 2023-02-10 DIAGNOSIS — I82403 Acute embolism and thrombosis of unspecified deep veins of lower extremity, bilateral: Secondary | ICD-10-CM | POA: Diagnosis not present

## 2023-02-10 DIAGNOSIS — R1312 Dysphagia, oropharyngeal phase: Secondary | ICD-10-CM | POA: Diagnosis not present

## 2023-02-10 DIAGNOSIS — R4182 Altered mental status, unspecified: Secondary | ICD-10-CM | POA: Diagnosis not present

## 2023-02-10 DIAGNOSIS — I82493 Acute embolism and thrombosis of other specified deep vein of lower extremity, bilateral: Secondary | ICD-10-CM | POA: Diagnosis not present

## 2023-02-10 DIAGNOSIS — Z931 Gastrostomy status: Secondary | ICD-10-CM | POA: Diagnosis not present

## 2023-02-10 DIAGNOSIS — Z431 Encounter for attention to gastrostomy: Secondary | ICD-10-CM | POA: Diagnosis not present

## 2023-02-10 DIAGNOSIS — K219 Gastro-esophageal reflux disease without esophagitis: Secondary | ICD-10-CM | POA: Diagnosis present

## 2023-02-10 DIAGNOSIS — J189 Pneumonia, unspecified organism: Secondary | ICD-10-CM | POA: Diagnosis not present

## 2023-02-10 DIAGNOSIS — G8918 Other acute postprocedural pain: Secondary | ICD-10-CM | POA: Diagnosis not present

## 2023-02-10 DIAGNOSIS — I824Z2 Acute embolism and thrombosis of unspecified deep veins of left distal lower extremity: Secondary | ICD-10-CM | POA: Diagnosis not present

## 2023-02-10 DIAGNOSIS — I5022 Chronic systolic (congestive) heart failure: Secondary | ICD-10-CM | POA: Diagnosis not present

## 2023-02-10 DIAGNOSIS — K5900 Constipation, unspecified: Secondary | ICD-10-CM | POA: Diagnosis not present

## 2023-02-10 DIAGNOSIS — J9600 Acute respiratory failure, unspecified whether with hypoxia or hypercapnia: Secondary | ICD-10-CM | POA: Diagnosis not present

## 2023-02-10 DIAGNOSIS — I82501 Chronic embolism and thrombosis of unspecified deep veins of right lower extremity: Secondary | ICD-10-CM | POA: Diagnosis not present

## 2023-02-10 DIAGNOSIS — R059 Cough, unspecified: Secondary | ICD-10-CM | POA: Diagnosis not present

## 2023-02-10 DIAGNOSIS — D696 Thrombocytopenia, unspecified: Secondary | ICD-10-CM | POA: Diagnosis present

## 2023-02-10 DIAGNOSIS — E1122 Type 2 diabetes mellitus with diabetic chronic kidney disease: Secondary | ICD-10-CM | POA: Diagnosis present

## 2023-02-10 DIAGNOSIS — R1310 Dysphagia, unspecified: Secondary | ICD-10-CM | POA: Diagnosis not present

## 2023-02-10 DIAGNOSIS — I129 Hypertensive chronic kidney disease with stage 1 through stage 4 chronic kidney disease, or unspecified chronic kidney disease: Secondary | ICD-10-CM | POA: Diagnosis not present

## 2023-02-10 DIAGNOSIS — R509 Fever, unspecified: Secondary | ICD-10-CM | POA: Diagnosis not present

## 2023-02-10 DIAGNOSIS — I110 Hypertensive heart disease with heart failure: Secondary | ICD-10-CM | POA: Diagnosis not present

## 2023-02-10 DIAGNOSIS — R0902 Hypoxemia: Secondary | ICD-10-CM | POA: Diagnosis not present

## 2023-02-10 DIAGNOSIS — F419 Anxiety disorder, unspecified: Secondary | ICD-10-CM | POA: Diagnosis present

## 2023-02-10 DIAGNOSIS — J90 Pleural effusion, not elsewhere classified: Secondary | ICD-10-CM | POA: Diagnosis not present

## 2023-02-10 DIAGNOSIS — I501 Left ventricular failure: Secondary | ICD-10-CM | POA: Diagnosis not present

## 2023-02-10 DIAGNOSIS — R040 Epistaxis: Secondary | ICD-10-CM | POA: Diagnosis not present

## 2023-02-10 DIAGNOSIS — N184 Chronic kidney disease, stage 4 (severe): Secondary | ICD-10-CM | POA: Diagnosis present

## 2023-02-10 DIAGNOSIS — R042 Hemoptysis: Secondary | ICD-10-CM | POA: Diagnosis not present

## 2023-02-10 DIAGNOSIS — I517 Cardiomegaly: Secondary | ICD-10-CM | POA: Diagnosis not present

## 2023-02-10 DIAGNOSIS — I82463 Acute embolism and thrombosis of calf muscular vein, bilateral: Secondary | ICD-10-CM | POA: Diagnosis not present

## 2023-02-10 DIAGNOSIS — J9690 Respiratory failure, unspecified, unspecified whether with hypoxia or hypercapnia: Secondary | ICD-10-CM | POA: Diagnosis not present

## 2023-02-10 DIAGNOSIS — G8929 Other chronic pain: Secondary | ICD-10-CM | POA: Diagnosis not present

## 2023-02-10 DIAGNOSIS — I871 Compression of vein: Secondary | ICD-10-CM | POA: Diagnosis not present

## 2023-02-10 DIAGNOSIS — E8770 Fluid overload, unspecified: Secondary | ICD-10-CM | POA: Diagnosis not present

## 2023-02-10 DIAGNOSIS — A419 Sepsis, unspecified organism: Secondary | ICD-10-CM | POA: Diagnosis present

## 2023-02-10 DIAGNOSIS — F05 Delirium due to known physiological condition: Secondary | ICD-10-CM | POA: Diagnosis not present

## 2023-02-10 DIAGNOSIS — J15212 Pneumonia due to Methicillin resistant Staphylococcus aureus: Secondary | ICD-10-CM | POA: Diagnosis present

## 2023-02-10 DIAGNOSIS — R633 Feeding difficulties, unspecified: Secondary | ICD-10-CM | POA: Diagnosis not present

## 2023-02-10 DIAGNOSIS — D631 Anemia in chronic kidney disease: Secondary | ICD-10-CM | POA: Diagnosis not present

## 2023-02-10 DIAGNOSIS — Z7409 Other reduced mobility: Secondary | ICD-10-CM | POA: Diagnosis not present

## 2023-02-10 DIAGNOSIS — N189 Chronic kidney disease, unspecified: Secondary | ICD-10-CM | POA: Diagnosis not present

## 2023-02-10 DIAGNOSIS — N281 Cyst of kidney, acquired: Secondary | ICD-10-CM | POA: Diagnosis not present

## 2023-02-10 DIAGNOSIS — Z992 Dependence on renal dialysis: Secondary | ICD-10-CM | POA: Diagnosis not present

## 2023-02-10 DIAGNOSIS — J4489 Other specified chronic obstructive pulmonary disease: Secondary | ICD-10-CM | POA: Diagnosis not present

## 2023-02-10 DIAGNOSIS — E872 Acidosis, unspecified: Secondary | ICD-10-CM | POA: Diagnosis not present

## 2023-02-10 DIAGNOSIS — J811 Chronic pulmonary edema: Secondary | ICD-10-CM | POA: Diagnosis not present

## 2023-02-10 DIAGNOSIS — I130 Hypertensive heart and chronic kidney disease with heart failure and stage 1 through stage 4 chronic kidney disease, or unspecified chronic kidney disease: Secondary | ICD-10-CM | POA: Diagnosis present

## 2023-02-10 DIAGNOSIS — N179 Acute kidney failure, unspecified: Secondary | ICD-10-CM | POA: Diagnosis present

## 2023-02-10 DIAGNOSIS — J440 Chronic obstructive pulmonary disease with acute lower respiratory infection: Secondary | ICD-10-CM | POA: Diagnosis present

## 2023-02-10 DIAGNOSIS — D509 Iron deficiency anemia, unspecified: Secondary | ICD-10-CM | POA: Diagnosis present

## 2023-02-10 DIAGNOSIS — J810 Acute pulmonary edema: Secondary | ICD-10-CM | POA: Diagnosis not present

## 2023-02-10 DIAGNOSIS — I5020 Unspecified systolic (congestive) heart failure: Secondary | ICD-10-CM | POA: Diagnosis not present

## 2023-02-10 DIAGNOSIS — N186 End stage renal disease: Secondary | ICD-10-CM | POA: Diagnosis not present

## 2023-02-10 DIAGNOSIS — D6489 Other specified anemias: Secondary | ICD-10-CM | POA: Diagnosis not present

## 2023-02-10 DIAGNOSIS — D649 Anemia, unspecified: Secondary | ICD-10-CM | POA: Diagnosis not present

## 2023-02-10 DIAGNOSIS — E11649 Type 2 diabetes mellitus with hypoglycemia without coma: Secondary | ICD-10-CM | POA: Diagnosis not present

## 2023-02-10 DIAGNOSIS — J690 Pneumonitis due to inhalation of food and vomit: Secondary | ICD-10-CM | POA: Diagnosis not present

## 2023-02-10 DIAGNOSIS — E871 Hypo-osmolality and hyponatremia: Secondary | ICD-10-CM | POA: Diagnosis present

## 2023-02-10 DIAGNOSIS — Z1152 Encounter for screening for COVID-19: Secondary | ICD-10-CM | POA: Diagnosis not present

## 2023-02-11 DIAGNOSIS — E8770 Fluid overload, unspecified: Secondary | ICD-10-CM | POA: Diagnosis not present

## 2023-02-11 DIAGNOSIS — J90 Pleural effusion, not elsewhere classified: Secondary | ICD-10-CM | POA: Diagnosis not present

## 2023-02-11 DIAGNOSIS — E8729 Other acidosis: Secondary | ICD-10-CM | POA: Diagnosis not present

## 2023-02-11 DIAGNOSIS — J4489 Other specified chronic obstructive pulmonary disease: Secondary | ICD-10-CM | POA: Diagnosis not present

## 2023-02-11 DIAGNOSIS — N189 Chronic kidney disease, unspecified: Secondary | ICD-10-CM | POA: Diagnosis not present

## 2023-02-11 DIAGNOSIS — D649 Anemia, unspecified: Secondary | ICD-10-CM | POA: Diagnosis not present

## 2023-02-11 DIAGNOSIS — N281 Cyst of kidney, acquired: Secondary | ICD-10-CM | POA: Diagnosis not present

## 2023-02-11 DIAGNOSIS — N179 Acute kidney failure, unspecified: Secondary | ICD-10-CM | POA: Diagnosis not present

## 2023-02-11 DIAGNOSIS — J811 Chronic pulmonary edema: Secondary | ICD-10-CM | POA: Diagnosis not present

## 2023-02-12 ENCOUNTER — Ambulatory Visit: Payer: Medicare Other | Admitting: Cardiology

## 2023-02-12 DIAGNOSIS — J15212 Pneumonia due to Methicillin resistant Staphylococcus aureus: Secondary | ICD-10-CM | POA: Diagnosis not present

## 2023-02-12 DIAGNOSIS — E8729 Other acidosis: Secondary | ICD-10-CM | POA: Diagnosis not present

## 2023-02-12 DIAGNOSIS — J449 Chronic obstructive pulmonary disease, unspecified: Secondary | ICD-10-CM | POA: Diagnosis not present

## 2023-02-12 DIAGNOSIS — I82463 Acute embolism and thrombosis of calf muscular vein, bilateral: Secondary | ICD-10-CM | POA: Diagnosis not present

## 2023-02-12 DIAGNOSIS — Z9911 Dependence on respirator [ventilator] status: Secondary | ICD-10-CM | POA: Diagnosis not present

## 2023-02-12 DIAGNOSIS — E1122 Type 2 diabetes mellitus with diabetic chronic kidney disease: Secondary | ICD-10-CM | POA: Diagnosis not present

## 2023-02-12 DIAGNOSIS — N184 Chronic kidney disease, stage 4 (severe): Secondary | ICD-10-CM | POA: Diagnosis not present

## 2023-02-12 DIAGNOSIS — I129 Hypertensive chronic kidney disease with stage 1 through stage 4 chronic kidney disease, or unspecified chronic kidney disease: Secondary | ICD-10-CM | POA: Diagnosis not present

## 2023-02-12 DIAGNOSIS — J189 Pneumonia, unspecified organism: Secondary | ICD-10-CM | POA: Diagnosis not present

## 2023-02-12 DIAGNOSIS — D6489 Other specified anemias: Secondary | ICD-10-CM | POA: Diagnosis not present

## 2023-02-12 DIAGNOSIS — N179 Acute kidney failure, unspecified: Secondary | ICD-10-CM | POA: Diagnosis not present

## 2023-02-12 DIAGNOSIS — R0602 Shortness of breath: Secondary | ICD-10-CM | POA: Diagnosis not present

## 2023-02-12 DIAGNOSIS — B3781 Candidal esophagitis: Secondary | ICD-10-CM | POA: Diagnosis not present

## 2023-02-13 DIAGNOSIS — F419 Anxiety disorder, unspecified: Secondary | ICD-10-CM | POA: Diagnosis not present

## 2023-02-13 DIAGNOSIS — J449 Chronic obstructive pulmonary disease, unspecified: Secondary | ICD-10-CM | POA: Diagnosis not present

## 2023-02-13 DIAGNOSIS — I82403 Acute embolism and thrombosis of unspecified deep veins of lower extremity, bilateral: Secondary | ICD-10-CM | POA: Diagnosis not present

## 2023-02-13 DIAGNOSIS — N184 Chronic kidney disease, stage 4 (severe): Secondary | ICD-10-CM | POA: Diagnosis not present

## 2023-02-13 DIAGNOSIS — I517 Cardiomegaly: Secondary | ICD-10-CM | POA: Diagnosis not present

## 2023-02-13 DIAGNOSIS — I130 Hypertensive heart and chronic kidney disease with heart failure and stage 1 through stage 4 chronic kidney disease, or unspecified chronic kidney disease: Secondary | ICD-10-CM | POA: Diagnosis not present

## 2023-02-13 DIAGNOSIS — B3781 Candidal esophagitis: Secondary | ICD-10-CM | POA: Diagnosis not present

## 2023-02-13 DIAGNOSIS — J189 Pneumonia, unspecified organism: Secondary | ICD-10-CM | POA: Diagnosis not present

## 2023-02-13 DIAGNOSIS — E1122 Type 2 diabetes mellitus with diabetic chronic kidney disease: Secondary | ICD-10-CM | POA: Diagnosis not present

## 2023-02-13 DIAGNOSIS — G8929 Other chronic pain: Secondary | ICD-10-CM | POA: Diagnosis not present

## 2023-02-13 DIAGNOSIS — D6489 Other specified anemias: Secondary | ICD-10-CM | POA: Diagnosis not present

## 2023-02-13 DIAGNOSIS — I5020 Unspecified systolic (congestive) heart failure: Secondary | ICD-10-CM | POA: Diagnosis not present

## 2023-02-13 DIAGNOSIS — N179 Acute kidney failure, unspecified: Secondary | ICD-10-CM | POA: Diagnosis not present

## 2023-02-14 DIAGNOSIS — G8929 Other chronic pain: Secondary | ICD-10-CM | POA: Diagnosis not present

## 2023-02-14 DIAGNOSIS — E1122 Type 2 diabetes mellitus with diabetic chronic kidney disease: Secondary | ICD-10-CM | POA: Diagnosis not present

## 2023-02-14 DIAGNOSIS — B3781 Candidal esophagitis: Secondary | ICD-10-CM | POA: Diagnosis not present

## 2023-02-14 DIAGNOSIS — J449 Chronic obstructive pulmonary disease, unspecified: Secondary | ICD-10-CM | POA: Diagnosis not present

## 2023-02-14 DIAGNOSIS — I82403 Acute embolism and thrombosis of unspecified deep veins of lower extremity, bilateral: Secondary | ICD-10-CM | POA: Diagnosis not present

## 2023-02-14 DIAGNOSIS — N179 Acute kidney failure, unspecified: Secondary | ICD-10-CM | POA: Diagnosis not present

## 2023-02-14 DIAGNOSIS — N184 Chronic kidney disease, stage 4 (severe): Secondary | ICD-10-CM | POA: Diagnosis not present

## 2023-02-14 DIAGNOSIS — D6489 Other specified anemias: Secondary | ICD-10-CM | POA: Diagnosis not present

## 2023-02-14 DIAGNOSIS — I5020 Unspecified systolic (congestive) heart failure: Secondary | ICD-10-CM | POA: Diagnosis not present

## 2023-02-14 DIAGNOSIS — I130 Hypertensive heart and chronic kidney disease with heart failure and stage 1 through stage 4 chronic kidney disease, or unspecified chronic kidney disease: Secondary | ICD-10-CM | POA: Diagnosis not present

## 2023-02-14 DIAGNOSIS — F419 Anxiety disorder, unspecified: Secondary | ICD-10-CM | POA: Diagnosis not present

## 2023-02-14 DIAGNOSIS — J9601 Acute respiratory failure with hypoxia: Secondary | ICD-10-CM | POA: Diagnosis not present

## 2023-02-15 DIAGNOSIS — K5900 Constipation, unspecified: Secondary | ICD-10-CM | POA: Diagnosis not present

## 2023-02-15 DIAGNOSIS — F419 Anxiety disorder, unspecified: Secondary | ICD-10-CM | POA: Diagnosis not present

## 2023-02-15 DIAGNOSIS — J449 Chronic obstructive pulmonary disease, unspecified: Secondary | ICD-10-CM | POA: Diagnosis not present

## 2023-02-15 DIAGNOSIS — J9601 Acute respiratory failure with hypoxia: Secondary | ICD-10-CM | POA: Diagnosis not present

## 2023-02-15 DIAGNOSIS — I82403 Acute embolism and thrombosis of unspecified deep veins of lower extremity, bilateral: Secondary | ICD-10-CM | POA: Diagnosis not present

## 2023-02-15 DIAGNOSIS — J15212 Pneumonia due to Methicillin resistant Staphylococcus aureus: Secondary | ICD-10-CM | POA: Diagnosis not present

## 2023-02-15 DIAGNOSIS — I5020 Unspecified systolic (congestive) heart failure: Secondary | ICD-10-CM | POA: Diagnosis not present

## 2023-02-15 DIAGNOSIS — N184 Chronic kidney disease, stage 4 (severe): Secondary | ICD-10-CM | POA: Diagnosis not present

## 2023-02-15 DIAGNOSIS — N179 Acute kidney failure, unspecified: Secondary | ICD-10-CM | POA: Diagnosis not present

## 2023-02-15 DIAGNOSIS — I130 Hypertensive heart and chronic kidney disease with heart failure and stage 1 through stage 4 chronic kidney disease, or unspecified chronic kidney disease: Secondary | ICD-10-CM | POA: Diagnosis not present

## 2023-02-15 DIAGNOSIS — D6489 Other specified anemias: Secondary | ICD-10-CM | POA: Diagnosis not present

## 2023-02-15 DIAGNOSIS — B3781 Candidal esophagitis: Secondary | ICD-10-CM | POA: Diagnosis not present

## 2023-02-15 DIAGNOSIS — Z9911 Dependence on respirator [ventilator] status: Secondary | ICD-10-CM | POA: Diagnosis not present

## 2023-02-16 DIAGNOSIS — F419 Anxiety disorder, unspecified: Secondary | ICD-10-CM | POA: Diagnosis not present

## 2023-02-16 DIAGNOSIS — N184 Chronic kidney disease, stage 4 (severe): Secondary | ICD-10-CM | POA: Diagnosis not present

## 2023-02-16 DIAGNOSIS — Z9911 Dependence on respirator [ventilator] status: Secondary | ICD-10-CM | POA: Diagnosis not present

## 2023-02-16 DIAGNOSIS — I5020 Unspecified systolic (congestive) heart failure: Secondary | ICD-10-CM | POA: Diagnosis not present

## 2023-02-16 DIAGNOSIS — J9601 Acute respiratory failure with hypoxia: Secondary | ICD-10-CM | POA: Diagnosis not present

## 2023-02-16 DIAGNOSIS — N179 Acute kidney failure, unspecified: Secondary | ICD-10-CM | POA: Diagnosis not present

## 2023-02-16 DIAGNOSIS — R0600 Dyspnea, unspecified: Secondary | ICD-10-CM | POA: Diagnosis not present

## 2023-02-16 DIAGNOSIS — B3781 Candidal esophagitis: Secondary | ICD-10-CM | POA: Diagnosis not present

## 2023-02-16 DIAGNOSIS — G8929 Other chronic pain: Secondary | ICD-10-CM | POA: Diagnosis not present

## 2023-02-16 DIAGNOSIS — J15212 Pneumonia due to Methicillin resistant Staphylococcus aureus: Secondary | ICD-10-CM | POA: Diagnosis not present

## 2023-02-16 DIAGNOSIS — J449 Chronic obstructive pulmonary disease, unspecified: Secondary | ICD-10-CM | POA: Diagnosis not present

## 2023-02-16 DIAGNOSIS — J189 Pneumonia, unspecified organism: Secondary | ICD-10-CM | POA: Diagnosis not present

## 2023-02-16 DIAGNOSIS — I130 Hypertensive heart and chronic kidney disease with heart failure and stage 1 through stage 4 chronic kidney disease, or unspecified chronic kidney disease: Secondary | ICD-10-CM | POA: Diagnosis not present

## 2023-02-16 DIAGNOSIS — I82403 Acute embolism and thrombosis of unspecified deep veins of lower extremity, bilateral: Secondary | ICD-10-CM | POA: Diagnosis not present

## 2023-02-17 DIAGNOSIS — N179 Acute kidney failure, unspecified: Secondary | ICD-10-CM | POA: Diagnosis not present

## 2023-02-17 DIAGNOSIS — I5020 Unspecified systolic (congestive) heart failure: Secondary | ICD-10-CM | POA: Diagnosis not present

## 2023-02-17 DIAGNOSIS — G8929 Other chronic pain: Secondary | ICD-10-CM | POA: Diagnosis not present

## 2023-02-17 DIAGNOSIS — J449 Chronic obstructive pulmonary disease, unspecified: Secondary | ICD-10-CM | POA: Diagnosis not present

## 2023-02-17 DIAGNOSIS — F419 Anxiety disorder, unspecified: Secondary | ICD-10-CM | POA: Diagnosis not present

## 2023-02-17 DIAGNOSIS — I82403 Acute embolism and thrombosis of unspecified deep veins of lower extremity, bilateral: Secondary | ICD-10-CM | POA: Diagnosis not present

## 2023-02-17 DIAGNOSIS — J9601 Acute respiratory failure with hypoxia: Secondary | ICD-10-CM | POA: Diagnosis not present

## 2023-02-17 DIAGNOSIS — B3781 Candidal esophagitis: Secondary | ICD-10-CM | POA: Diagnosis not present

## 2023-02-17 DIAGNOSIS — Z9911 Dependence on respirator [ventilator] status: Secondary | ICD-10-CM | POA: Diagnosis not present

## 2023-02-17 DIAGNOSIS — J15212 Pneumonia due to Methicillin resistant Staphylococcus aureus: Secondary | ICD-10-CM | POA: Diagnosis not present

## 2023-02-17 DIAGNOSIS — J189 Pneumonia, unspecified organism: Secondary | ICD-10-CM | POA: Diagnosis not present

## 2023-02-17 DIAGNOSIS — R509 Fever, unspecified: Secondary | ICD-10-CM | POA: Diagnosis not present

## 2023-02-17 DIAGNOSIS — N184 Chronic kidney disease, stage 4 (severe): Secondary | ICD-10-CM | POA: Diagnosis not present

## 2023-02-18 DIAGNOSIS — G8929 Other chronic pain: Secondary | ICD-10-CM | POA: Diagnosis not present

## 2023-02-18 DIAGNOSIS — F419 Anxiety disorder, unspecified: Secondary | ICD-10-CM | POA: Diagnosis not present

## 2023-02-18 DIAGNOSIS — I130 Hypertensive heart and chronic kidney disease with heart failure and stage 1 through stage 4 chronic kidney disease, or unspecified chronic kidney disease: Secondary | ICD-10-CM | POA: Diagnosis not present

## 2023-02-18 DIAGNOSIS — Z452 Encounter for adjustment and management of vascular access device: Secondary | ICD-10-CM | POA: Diagnosis not present

## 2023-02-18 DIAGNOSIS — E1122 Type 2 diabetes mellitus with diabetic chronic kidney disease: Secondary | ICD-10-CM | POA: Diagnosis not present

## 2023-02-18 DIAGNOSIS — J9601 Acute respiratory failure with hypoxia: Secondary | ICD-10-CM | POA: Diagnosis not present

## 2023-02-18 DIAGNOSIS — J95851 Ventilator associated pneumonia: Secondary | ICD-10-CM | POA: Diagnosis not present

## 2023-02-18 DIAGNOSIS — N184 Chronic kidney disease, stage 4 (severe): Secondary | ICD-10-CM | POA: Diagnosis not present

## 2023-02-18 DIAGNOSIS — J449 Chronic obstructive pulmonary disease, unspecified: Secondary | ICD-10-CM | POA: Diagnosis not present

## 2023-02-18 DIAGNOSIS — N179 Acute kidney failure, unspecified: Secondary | ICD-10-CM | POA: Diagnosis not present

## 2023-02-18 DIAGNOSIS — Z4682 Encounter for fitting and adjustment of non-vascular catheter: Secondary | ICD-10-CM | POA: Diagnosis not present

## 2023-02-18 DIAGNOSIS — Z9911 Dependence on respirator [ventilator] status: Secondary | ICD-10-CM | POA: Diagnosis not present

## 2023-02-18 DIAGNOSIS — I82403 Acute embolism and thrombosis of unspecified deep veins of lower extremity, bilateral: Secondary | ICD-10-CM | POA: Diagnosis not present

## 2023-02-18 DIAGNOSIS — J189 Pneumonia, unspecified organism: Secondary | ICD-10-CM | POA: Diagnosis not present

## 2023-02-18 DIAGNOSIS — I5020 Unspecified systolic (congestive) heart failure: Secondary | ICD-10-CM | POA: Diagnosis not present

## 2023-02-19 DIAGNOSIS — G8929 Other chronic pain: Secondary | ICD-10-CM | POA: Diagnosis not present

## 2023-02-19 DIAGNOSIS — D6489 Other specified anemias: Secondary | ICD-10-CM | POA: Diagnosis not present

## 2023-02-19 DIAGNOSIS — B3781 Candidal esophagitis: Secondary | ICD-10-CM | POA: Diagnosis not present

## 2023-02-19 DIAGNOSIS — I5020 Unspecified systolic (congestive) heart failure: Secondary | ICD-10-CM | POA: Diagnosis not present

## 2023-02-19 DIAGNOSIS — N179 Acute kidney failure, unspecified: Secondary | ICD-10-CM | POA: Diagnosis not present

## 2023-02-19 DIAGNOSIS — I130 Hypertensive heart and chronic kidney disease with heart failure and stage 1 through stage 4 chronic kidney disease, or unspecified chronic kidney disease: Secondary | ICD-10-CM | POA: Diagnosis not present

## 2023-02-19 DIAGNOSIS — N184 Chronic kidney disease, stage 4 (severe): Secondary | ICD-10-CM | POA: Diagnosis not present

## 2023-02-19 DIAGNOSIS — J449 Chronic obstructive pulmonary disease, unspecified: Secondary | ICD-10-CM | POA: Diagnosis not present

## 2023-02-19 DIAGNOSIS — R042 Hemoptysis: Secondary | ICD-10-CM | POA: Diagnosis not present

## 2023-02-19 DIAGNOSIS — I82403 Acute embolism and thrombosis of unspecified deep veins of lower extremity, bilateral: Secondary | ICD-10-CM | POA: Diagnosis not present

## 2023-02-19 DIAGNOSIS — F419 Anxiety disorder, unspecified: Secondary | ICD-10-CM | POA: Diagnosis not present

## 2023-02-19 DIAGNOSIS — Z9911 Dependence on respirator [ventilator] status: Secondary | ICD-10-CM | POA: Diagnosis not present

## 2023-02-19 DIAGNOSIS — J9601 Acute respiratory failure with hypoxia: Secondary | ICD-10-CM | POA: Diagnosis not present

## 2023-02-20 DIAGNOSIS — I130 Hypertensive heart and chronic kidney disease with heart failure and stage 1 through stage 4 chronic kidney disease, or unspecified chronic kidney disease: Secondary | ICD-10-CM | POA: Diagnosis not present

## 2023-02-20 DIAGNOSIS — Z9911 Dependence on respirator [ventilator] status: Secondary | ICD-10-CM | POA: Diagnosis not present

## 2023-02-20 DIAGNOSIS — F419 Anxiety disorder, unspecified: Secondary | ICD-10-CM | POA: Diagnosis not present

## 2023-02-20 DIAGNOSIS — N184 Chronic kidney disease, stage 4 (severe): Secondary | ICD-10-CM | POA: Diagnosis not present

## 2023-02-20 DIAGNOSIS — B3781 Candidal esophagitis: Secondary | ICD-10-CM | POA: Diagnosis not present

## 2023-02-20 DIAGNOSIS — J811 Chronic pulmonary edema: Secondary | ICD-10-CM | POA: Diagnosis not present

## 2023-02-20 DIAGNOSIS — I5020 Unspecified systolic (congestive) heart failure: Secondary | ICD-10-CM | POA: Diagnosis not present

## 2023-02-20 DIAGNOSIS — J95851 Ventilator associated pneumonia: Secondary | ICD-10-CM | POA: Diagnosis not present

## 2023-02-20 DIAGNOSIS — J9601 Acute respiratory failure with hypoxia: Secondary | ICD-10-CM | POA: Diagnosis not present

## 2023-02-20 DIAGNOSIS — G8929 Other chronic pain: Secondary | ICD-10-CM | POA: Diagnosis not present

## 2023-02-20 DIAGNOSIS — K5900 Constipation, unspecified: Secondary | ICD-10-CM | POA: Diagnosis not present

## 2023-02-20 DIAGNOSIS — J189 Pneumonia, unspecified organism: Secondary | ICD-10-CM | POA: Diagnosis not present

## 2023-02-20 DIAGNOSIS — J449 Chronic obstructive pulmonary disease, unspecified: Secondary | ICD-10-CM | POA: Diagnosis not present

## 2023-02-20 DIAGNOSIS — N179 Acute kidney failure, unspecified: Secondary | ICD-10-CM | POA: Diagnosis not present

## 2023-02-21 DIAGNOSIS — Z452 Encounter for adjustment and management of vascular access device: Secondary | ICD-10-CM | POA: Diagnosis not present

## 2023-02-21 DIAGNOSIS — N184 Chronic kidney disease, stage 4 (severe): Secondary | ICD-10-CM | POA: Diagnosis not present

## 2023-02-21 DIAGNOSIS — R040 Epistaxis: Secondary | ICD-10-CM | POA: Diagnosis not present

## 2023-02-21 DIAGNOSIS — Z4682 Encounter for fitting and adjustment of non-vascular catheter: Secondary | ICD-10-CM | POA: Diagnosis not present

## 2023-02-21 DIAGNOSIS — D6489 Other specified anemias: Secondary | ICD-10-CM | POA: Diagnosis not present

## 2023-02-21 DIAGNOSIS — I5020 Unspecified systolic (congestive) heart failure: Secondary | ICD-10-CM | POA: Diagnosis not present

## 2023-02-21 DIAGNOSIS — J811 Chronic pulmonary edema: Secondary | ICD-10-CM | POA: Diagnosis not present

## 2023-02-21 DIAGNOSIS — I82403 Acute embolism and thrombosis of unspecified deep veins of lower extremity, bilateral: Secondary | ICD-10-CM | POA: Diagnosis not present

## 2023-02-21 DIAGNOSIS — J449 Chronic obstructive pulmonary disease, unspecified: Secondary | ICD-10-CM | POA: Diagnosis not present

## 2023-02-21 DIAGNOSIS — G8929 Other chronic pain: Secondary | ICD-10-CM | POA: Diagnosis not present

## 2023-02-21 DIAGNOSIS — F419 Anxiety disorder, unspecified: Secondary | ICD-10-CM | POA: Diagnosis not present

## 2023-02-21 DIAGNOSIS — J9601 Acute respiratory failure with hypoxia: Secondary | ICD-10-CM | POA: Diagnosis not present

## 2023-02-21 DIAGNOSIS — N179 Acute kidney failure, unspecified: Secondary | ICD-10-CM | POA: Diagnosis not present

## 2023-02-21 DIAGNOSIS — J9690 Respiratory failure, unspecified, unspecified whether with hypoxia or hypercapnia: Secondary | ICD-10-CM | POA: Diagnosis not present

## 2023-02-21 DIAGNOSIS — J810 Acute pulmonary edema: Secondary | ICD-10-CM | POA: Diagnosis not present

## 2023-02-21 DIAGNOSIS — J90 Pleural effusion, not elsewhere classified: Secondary | ICD-10-CM | POA: Diagnosis not present

## 2023-02-21 DIAGNOSIS — Z9911 Dependence on respirator [ventilator] status: Secondary | ICD-10-CM | POA: Diagnosis not present

## 2023-02-21 DIAGNOSIS — R918 Other nonspecific abnormal finding of lung field: Secondary | ICD-10-CM | POA: Diagnosis not present

## 2023-02-21 DIAGNOSIS — J9600 Acute respiratory failure, unspecified whether with hypoxia or hypercapnia: Secondary | ICD-10-CM | POA: Diagnosis not present

## 2023-02-21 DIAGNOSIS — E1122 Type 2 diabetes mellitus with diabetic chronic kidney disease: Secondary | ICD-10-CM | POA: Diagnosis not present

## 2023-02-22 DIAGNOSIS — I82403 Acute embolism and thrombosis of unspecified deep veins of lower extremity, bilateral: Secondary | ICD-10-CM | POA: Diagnosis not present

## 2023-02-22 DIAGNOSIS — N179 Acute kidney failure, unspecified: Secondary | ICD-10-CM | POA: Diagnosis not present

## 2023-02-22 DIAGNOSIS — J449 Chronic obstructive pulmonary disease, unspecified: Secondary | ICD-10-CM | POA: Diagnosis not present

## 2023-02-22 DIAGNOSIS — J9601 Acute respiratory failure with hypoxia: Secondary | ICD-10-CM | POA: Diagnosis not present

## 2023-02-22 DIAGNOSIS — J189 Pneumonia, unspecified organism: Secondary | ICD-10-CM | POA: Diagnosis not present

## 2023-02-22 DIAGNOSIS — I5020 Unspecified systolic (congestive) heart failure: Secondary | ICD-10-CM | POA: Diagnosis not present

## 2023-02-22 DIAGNOSIS — I130 Hypertensive heart and chronic kidney disease with heart failure and stage 1 through stage 4 chronic kidney disease, or unspecified chronic kidney disease: Secondary | ICD-10-CM | POA: Diagnosis not present

## 2023-02-22 DIAGNOSIS — Z9911 Dependence on respirator [ventilator] status: Secondary | ICD-10-CM | POA: Diagnosis not present

## 2023-02-22 DIAGNOSIS — F419 Anxiety disorder, unspecified: Secondary | ICD-10-CM | POA: Diagnosis not present

## 2023-02-22 DIAGNOSIS — G8929 Other chronic pain: Secondary | ICD-10-CM | POA: Diagnosis not present

## 2023-02-22 DIAGNOSIS — N184 Chronic kidney disease, stage 4 (severe): Secondary | ICD-10-CM | POA: Diagnosis not present

## 2023-02-22 DIAGNOSIS — J95851 Ventilator associated pneumonia: Secondary | ICD-10-CM | POA: Diagnosis not present

## 2023-02-23 DIAGNOSIS — J9601 Acute respiratory failure with hypoxia: Secondary | ICD-10-CM | POA: Diagnosis not present

## 2023-02-23 DIAGNOSIS — N184 Chronic kidney disease, stage 4 (severe): Secondary | ICD-10-CM | POA: Diagnosis not present

## 2023-02-23 DIAGNOSIS — J449 Chronic obstructive pulmonary disease, unspecified: Secondary | ICD-10-CM | POA: Diagnosis not present

## 2023-02-23 DIAGNOSIS — J95851 Ventilator associated pneumonia: Secondary | ICD-10-CM | POA: Diagnosis not present

## 2023-02-23 DIAGNOSIS — G8929 Other chronic pain: Secondary | ICD-10-CM | POA: Diagnosis not present

## 2023-02-23 DIAGNOSIS — I5020 Unspecified systolic (congestive) heart failure: Secondary | ICD-10-CM | POA: Diagnosis not present

## 2023-02-23 DIAGNOSIS — Z9911 Dependence on respirator [ventilator] status: Secondary | ICD-10-CM | POA: Diagnosis not present

## 2023-02-23 DIAGNOSIS — F419 Anxiety disorder, unspecified: Secondary | ICD-10-CM | POA: Diagnosis not present

## 2023-02-23 DIAGNOSIS — I130 Hypertensive heart and chronic kidney disease with heart failure and stage 1 through stage 4 chronic kidney disease, or unspecified chronic kidney disease: Secondary | ICD-10-CM | POA: Diagnosis not present

## 2023-02-23 DIAGNOSIS — N179 Acute kidney failure, unspecified: Secondary | ICD-10-CM | POA: Diagnosis not present

## 2023-02-23 DIAGNOSIS — B3781 Candidal esophagitis: Secondary | ICD-10-CM | POA: Diagnosis not present

## 2023-02-24 DIAGNOSIS — Z9911 Dependence on respirator [ventilator] status: Secondary | ICD-10-CM | POA: Diagnosis not present

## 2023-02-24 DIAGNOSIS — F419 Anxiety disorder, unspecified: Secondary | ICD-10-CM | POA: Diagnosis not present

## 2023-02-24 DIAGNOSIS — D6489 Other specified anemias: Secondary | ICD-10-CM | POA: Diagnosis not present

## 2023-02-24 DIAGNOSIS — J449 Chronic obstructive pulmonary disease, unspecified: Secondary | ICD-10-CM | POA: Diagnosis not present

## 2023-02-24 DIAGNOSIS — N179 Acute kidney failure, unspecified: Secondary | ICD-10-CM | POA: Diagnosis not present

## 2023-02-24 DIAGNOSIS — J95851 Ventilator associated pneumonia: Secondary | ICD-10-CM | POA: Diagnosis not present

## 2023-02-24 DIAGNOSIS — E1122 Type 2 diabetes mellitus with diabetic chronic kidney disease: Secondary | ICD-10-CM | POA: Diagnosis not present

## 2023-02-24 DIAGNOSIS — J9601 Acute respiratory failure with hypoxia: Secondary | ICD-10-CM | POA: Diagnosis not present

## 2023-02-24 DIAGNOSIS — N184 Chronic kidney disease, stage 4 (severe): Secondary | ICD-10-CM | POA: Diagnosis not present

## 2023-02-24 DIAGNOSIS — G8929 Other chronic pain: Secondary | ICD-10-CM | POA: Diagnosis not present

## 2023-02-24 DIAGNOSIS — I82403 Acute embolism and thrombosis of unspecified deep veins of lower extremity, bilateral: Secondary | ICD-10-CM | POA: Diagnosis not present

## 2023-02-24 DIAGNOSIS — I5020 Unspecified systolic (congestive) heart failure: Secondary | ICD-10-CM | POA: Diagnosis not present

## 2023-02-25 DIAGNOSIS — Z9911 Dependence on respirator [ventilator] status: Secondary | ICD-10-CM | POA: Diagnosis not present

## 2023-02-25 DIAGNOSIS — Z4682 Encounter for fitting and adjustment of non-vascular catheter: Secondary | ICD-10-CM | POA: Diagnosis not present

## 2023-02-25 DIAGNOSIS — G8929 Other chronic pain: Secondary | ICD-10-CM | POA: Diagnosis not present

## 2023-02-25 DIAGNOSIS — I130 Hypertensive heart and chronic kidney disease with heart failure and stage 1 through stage 4 chronic kidney disease, or unspecified chronic kidney disease: Secondary | ICD-10-CM | POA: Diagnosis not present

## 2023-02-25 DIAGNOSIS — J9601 Acute respiratory failure with hypoxia: Secondary | ICD-10-CM | POA: Diagnosis not present

## 2023-02-25 DIAGNOSIS — Z992 Dependence on renal dialysis: Secondary | ICD-10-CM | POA: Diagnosis not present

## 2023-02-25 DIAGNOSIS — N179 Acute kidney failure, unspecified: Secondary | ICD-10-CM | POA: Diagnosis not present

## 2023-02-25 DIAGNOSIS — D6489 Other specified anemias: Secondary | ICD-10-CM | POA: Diagnosis not present

## 2023-02-25 DIAGNOSIS — I5020 Unspecified systolic (congestive) heart failure: Secondary | ICD-10-CM | POA: Diagnosis not present

## 2023-02-25 DIAGNOSIS — R918 Other nonspecific abnormal finding of lung field: Secondary | ICD-10-CM | POA: Diagnosis not present

## 2023-02-25 DIAGNOSIS — F419 Anxiety disorder, unspecified: Secondary | ICD-10-CM | POA: Diagnosis not present

## 2023-02-25 DIAGNOSIS — Z452 Encounter for adjustment and management of vascular access device: Secondary | ICD-10-CM | POA: Diagnosis not present

## 2023-02-25 DIAGNOSIS — J449 Chronic obstructive pulmonary disease, unspecified: Secondary | ICD-10-CM | POA: Diagnosis not present

## 2023-02-25 DIAGNOSIS — J189 Pneumonia, unspecified organism: Secondary | ICD-10-CM | POA: Diagnosis not present

## 2023-02-25 DIAGNOSIS — I82403 Acute embolism and thrombosis of unspecified deep veins of lower extremity, bilateral: Secondary | ICD-10-CM | POA: Diagnosis not present

## 2023-02-25 DIAGNOSIS — N184 Chronic kidney disease, stage 4 (severe): Secondary | ICD-10-CM | POA: Diagnosis not present

## 2023-02-26 DIAGNOSIS — I82403 Acute embolism and thrombosis of unspecified deep veins of lower extremity, bilateral: Secondary | ICD-10-CM | POA: Diagnosis not present

## 2023-02-26 DIAGNOSIS — B3781 Candidal esophagitis: Secondary | ICD-10-CM | POA: Diagnosis not present

## 2023-02-26 DIAGNOSIS — Z9911 Dependence on respirator [ventilator] status: Secondary | ICD-10-CM | POA: Diagnosis not present

## 2023-02-26 DIAGNOSIS — D6489 Other specified anemias: Secondary | ICD-10-CM | POA: Diagnosis not present

## 2023-02-26 DIAGNOSIS — I5020 Unspecified systolic (congestive) heart failure: Secondary | ICD-10-CM | POA: Diagnosis not present

## 2023-02-26 DIAGNOSIS — J449 Chronic obstructive pulmonary disease, unspecified: Secondary | ICD-10-CM | POA: Diagnosis not present

## 2023-02-26 DIAGNOSIS — I110 Hypertensive heart disease with heart failure: Secondary | ICD-10-CM | POA: Diagnosis not present

## 2023-02-26 DIAGNOSIS — N179 Acute kidney failure, unspecified: Secondary | ICD-10-CM | POA: Diagnosis not present

## 2023-02-26 DIAGNOSIS — J9601 Acute respiratory failure with hypoxia: Secondary | ICD-10-CM | POA: Diagnosis not present

## 2023-02-26 DIAGNOSIS — F419 Anxiety disorder, unspecified: Secondary | ICD-10-CM | POA: Diagnosis not present

## 2023-02-26 DIAGNOSIS — I130 Hypertensive heart and chronic kidney disease with heart failure and stage 1 through stage 4 chronic kidney disease, or unspecified chronic kidney disease: Secondary | ICD-10-CM | POA: Diagnosis not present

## 2023-02-26 DIAGNOSIS — Z992 Dependence on renal dialysis: Secondary | ICD-10-CM | POA: Diagnosis not present

## 2023-02-26 DIAGNOSIS — N184 Chronic kidney disease, stage 4 (severe): Secondary | ICD-10-CM | POA: Diagnosis not present

## 2023-02-27 DIAGNOSIS — F419 Anxiety disorder, unspecified: Secondary | ICD-10-CM | POA: Diagnosis not present

## 2023-02-27 DIAGNOSIS — J449 Chronic obstructive pulmonary disease, unspecified: Secondary | ICD-10-CM | POA: Diagnosis not present

## 2023-02-27 DIAGNOSIS — J9601 Acute respiratory failure with hypoxia: Secondary | ICD-10-CM | POA: Diagnosis not present

## 2023-02-27 DIAGNOSIS — G8929 Other chronic pain: Secondary | ICD-10-CM | POA: Diagnosis not present

## 2023-02-27 DIAGNOSIS — I82403 Acute embolism and thrombosis of unspecified deep veins of lower extremity, bilateral: Secondary | ICD-10-CM | POA: Diagnosis not present

## 2023-02-27 DIAGNOSIS — N179 Acute kidney failure, unspecified: Secondary | ICD-10-CM | POA: Diagnosis not present

## 2023-02-27 DIAGNOSIS — Z9911 Dependence on respirator [ventilator] status: Secondary | ICD-10-CM | POA: Diagnosis not present

## 2023-02-27 DIAGNOSIS — I130 Hypertensive heart and chronic kidney disease with heart failure and stage 1 through stage 4 chronic kidney disease, or unspecified chronic kidney disease: Secondary | ICD-10-CM | POA: Diagnosis not present

## 2023-02-27 DIAGNOSIS — R040 Epistaxis: Secondary | ICD-10-CM | POA: Diagnosis not present

## 2023-02-27 DIAGNOSIS — I5020 Unspecified systolic (congestive) heart failure: Secondary | ICD-10-CM | POA: Diagnosis not present

## 2023-02-27 DIAGNOSIS — N184 Chronic kidney disease, stage 4 (severe): Secondary | ICD-10-CM | POA: Diagnosis not present

## 2023-02-27 DIAGNOSIS — J95851 Ventilator associated pneumonia: Secondary | ICD-10-CM | POA: Diagnosis not present

## 2023-02-28 DIAGNOSIS — I82403 Acute embolism and thrombosis of unspecified deep veins of lower extremity, bilateral: Secondary | ICD-10-CM | POA: Diagnosis not present

## 2023-02-28 DIAGNOSIS — N179 Acute kidney failure, unspecified: Secondary | ICD-10-CM | POA: Diagnosis not present

## 2023-02-28 DIAGNOSIS — I5020 Unspecified systolic (congestive) heart failure: Secondary | ICD-10-CM | POA: Diagnosis not present

## 2023-02-28 DIAGNOSIS — J9601 Acute respiratory failure with hypoxia: Secondary | ICD-10-CM | POA: Diagnosis not present

## 2023-02-28 DIAGNOSIS — J95851 Ventilator associated pneumonia: Secondary | ICD-10-CM | POA: Diagnosis not present

## 2023-02-28 DIAGNOSIS — N184 Chronic kidney disease, stage 4 (severe): Secondary | ICD-10-CM | POA: Diagnosis not present

## 2023-02-28 DIAGNOSIS — F419 Anxiety disorder, unspecified: Secondary | ICD-10-CM | POA: Diagnosis not present

## 2023-02-28 DIAGNOSIS — I130 Hypertensive heart and chronic kidney disease with heart failure and stage 1 through stage 4 chronic kidney disease, or unspecified chronic kidney disease: Secondary | ICD-10-CM | POA: Diagnosis not present

## 2023-02-28 DIAGNOSIS — Z9911 Dependence on respirator [ventilator] status: Secondary | ICD-10-CM | POA: Diagnosis not present

## 2023-02-28 DIAGNOSIS — G8929 Other chronic pain: Secondary | ICD-10-CM | POA: Diagnosis not present

## 2023-02-28 DIAGNOSIS — J449 Chronic obstructive pulmonary disease, unspecified: Secondary | ICD-10-CM | POA: Diagnosis not present

## 2023-02-28 DIAGNOSIS — R0902 Hypoxemia: Secondary | ICD-10-CM | POA: Diagnosis not present

## 2023-03-01 DIAGNOSIS — G8918 Other acute postprocedural pain: Secondary | ICD-10-CM | POA: Diagnosis not present

## 2023-03-01 DIAGNOSIS — J15212 Pneumonia due to Methicillin resistant Staphylococcus aureus: Secondary | ICD-10-CM | POA: Diagnosis not present

## 2023-03-01 DIAGNOSIS — M545 Low back pain, unspecified: Secondary | ICD-10-CM | POA: Diagnosis not present

## 2023-03-01 DIAGNOSIS — R4182 Altered mental status, unspecified: Secondary | ICD-10-CM | POA: Diagnosis not present

## 2023-03-01 DIAGNOSIS — J9601 Acute respiratory failure with hypoxia: Secondary | ICD-10-CM | POA: Diagnosis not present

## 2023-03-01 DIAGNOSIS — N184 Chronic kidney disease, stage 4 (severe): Secondary | ICD-10-CM | POA: Diagnosis not present

## 2023-03-01 DIAGNOSIS — E8770 Fluid overload, unspecified: Secondary | ICD-10-CM | POA: Diagnosis not present

## 2023-03-02 DIAGNOSIS — Z7409 Other reduced mobility: Secondary | ICD-10-CM | POA: Diagnosis not present

## 2023-03-02 DIAGNOSIS — E1122 Type 2 diabetes mellitus with diabetic chronic kidney disease: Secondary | ICD-10-CM | POA: Diagnosis not present

## 2023-03-02 DIAGNOSIS — Z992 Dependence on renal dialysis: Secondary | ICD-10-CM | POA: Diagnosis not present

## 2023-03-02 DIAGNOSIS — I82501 Chronic embolism and thrombosis of unspecified deep veins of right lower extremity: Secondary | ICD-10-CM | POA: Diagnosis not present

## 2023-03-02 DIAGNOSIS — J9601 Acute respiratory failure with hypoxia: Secondary | ICD-10-CM | POA: Diagnosis not present

## 2023-03-02 DIAGNOSIS — M545 Low back pain, unspecified: Secondary | ICD-10-CM | POA: Diagnosis not present

## 2023-03-02 DIAGNOSIS — J811 Chronic pulmonary edema: Secondary | ICD-10-CM | POA: Diagnosis not present

## 2023-03-02 DIAGNOSIS — G8929 Other chronic pain: Secondary | ICD-10-CM | POA: Diagnosis not present

## 2023-03-02 DIAGNOSIS — J90 Pleural effusion, not elsewhere classified: Secondary | ICD-10-CM | POA: Diagnosis not present

## 2023-03-02 DIAGNOSIS — I824Z2 Acute embolism and thrombosis of unspecified deep veins of left distal lower extremity: Secondary | ICD-10-CM | POA: Diagnosis not present

## 2023-03-02 DIAGNOSIS — I5020 Unspecified systolic (congestive) heart failure: Secondary | ICD-10-CM | POA: Diagnosis not present

## 2023-03-02 DIAGNOSIS — I130 Hypertensive heart and chronic kidney disease with heart failure and stage 1 through stage 4 chronic kidney disease, or unspecified chronic kidney disease: Secondary | ICD-10-CM | POA: Diagnosis not present

## 2023-03-02 DIAGNOSIS — R5381 Other malaise: Secondary | ICD-10-CM | POA: Diagnosis not present

## 2023-03-02 DIAGNOSIS — Z789 Other specified health status: Secondary | ICD-10-CM | POA: Diagnosis not present

## 2023-03-02 DIAGNOSIS — R918 Other nonspecific abnormal finding of lung field: Secondary | ICD-10-CM | POA: Diagnosis not present

## 2023-03-02 DIAGNOSIS — Z79891 Long term (current) use of opiate analgesic: Secondary | ICD-10-CM | POA: Diagnosis not present

## 2023-03-02 DIAGNOSIS — N184 Chronic kidney disease, stage 4 (severe): Secondary | ICD-10-CM | POA: Diagnosis not present

## 2023-03-03 DIAGNOSIS — I5023 Acute on chronic systolic (congestive) heart failure: Secondary | ICD-10-CM | POA: Diagnosis not present

## 2023-03-03 DIAGNOSIS — I130 Hypertensive heart and chronic kidney disease with heart failure and stage 1 through stage 4 chronic kidney disease, or unspecified chronic kidney disease: Secondary | ICD-10-CM | POA: Diagnosis not present

## 2023-03-03 DIAGNOSIS — N184 Chronic kidney disease, stage 4 (severe): Secondary | ICD-10-CM | POA: Diagnosis not present

## 2023-03-03 DIAGNOSIS — N179 Acute kidney failure, unspecified: Secondary | ICD-10-CM | POA: Diagnosis not present

## 2023-03-03 DIAGNOSIS — E1122 Type 2 diabetes mellitus with diabetic chronic kidney disease: Secondary | ICD-10-CM | POA: Diagnosis not present

## 2023-03-03 DIAGNOSIS — F05 Delirium due to known physiological condition: Secondary | ICD-10-CM | POA: Diagnosis not present

## 2023-03-03 DIAGNOSIS — D649 Anemia, unspecified: Secondary | ICD-10-CM | POA: Diagnosis not present

## 2023-03-04 DIAGNOSIS — J9601 Acute respiratory failure with hypoxia: Secondary | ICD-10-CM | POA: Diagnosis not present

## 2023-03-04 DIAGNOSIS — E8770 Fluid overload, unspecified: Secondary | ICD-10-CM | POA: Diagnosis not present

## 2023-03-04 DIAGNOSIS — J15212 Pneumonia due to Methicillin resistant Staphylococcus aureus: Secondary | ICD-10-CM | POA: Diagnosis not present

## 2023-03-04 DIAGNOSIS — F05 Delirium due to known physiological condition: Secondary | ICD-10-CM | POA: Diagnosis not present

## 2023-03-05 ENCOUNTER — Ambulatory Visit: Payer: Medicare Other | Admitting: Internal Medicine

## 2023-03-05 DIAGNOSIS — F05 Delirium due to known physiological condition: Secondary | ICD-10-CM | POA: Diagnosis not present

## 2023-03-05 DIAGNOSIS — N184 Chronic kidney disease, stage 4 (severe): Secondary | ICD-10-CM | POA: Diagnosis not present

## 2023-03-05 DIAGNOSIS — E1122 Type 2 diabetes mellitus with diabetic chronic kidney disease: Secondary | ICD-10-CM | POA: Diagnosis not present

## 2023-03-05 DIAGNOSIS — R059 Cough, unspecified: Secondary | ICD-10-CM | POA: Diagnosis not present

## 2023-03-05 DIAGNOSIS — J9601 Acute respiratory failure with hypoxia: Secondary | ICD-10-CM | POA: Diagnosis not present

## 2023-03-05 DIAGNOSIS — R1313 Dysphagia, pharyngeal phase: Secondary | ICD-10-CM | POA: Diagnosis not present

## 2023-03-05 DIAGNOSIS — I5020 Unspecified systolic (congestive) heart failure: Secondary | ICD-10-CM | POA: Diagnosis not present

## 2023-03-05 DIAGNOSIS — T17908A Unspecified foreign body in respiratory tract, part unspecified causing other injury, initial encounter: Secondary | ICD-10-CM | POA: Diagnosis not present

## 2023-03-06 DIAGNOSIS — E8770 Fluid overload, unspecified: Secondary | ICD-10-CM | POA: Diagnosis not present

## 2023-03-06 DIAGNOSIS — N184 Chronic kidney disease, stage 4 (severe): Secondary | ICD-10-CM | POA: Diagnosis not present

## 2023-03-06 DIAGNOSIS — J9601 Acute respiratory failure with hypoxia: Secondary | ICD-10-CM | POA: Diagnosis not present

## 2023-03-06 DIAGNOSIS — J15212 Pneumonia due to Methicillin resistant Staphylococcus aureus: Secondary | ICD-10-CM | POA: Diagnosis not present

## 2023-03-06 DIAGNOSIS — D631 Anemia in chronic kidney disease: Secondary | ICD-10-CM | POA: Diagnosis not present

## 2023-03-06 DIAGNOSIS — R1310 Dysphagia, unspecified: Secondary | ICD-10-CM | POA: Diagnosis not present

## 2023-03-06 DIAGNOSIS — N179 Acute kidney failure, unspecified: Secondary | ICD-10-CM | POA: Diagnosis not present

## 2023-03-07 DIAGNOSIS — I129 Hypertensive chronic kidney disease with stage 1 through stage 4 chronic kidney disease, or unspecified chronic kidney disease: Secondary | ICD-10-CM | POA: Diagnosis not present

## 2023-03-07 DIAGNOSIS — E1122 Type 2 diabetes mellitus with diabetic chronic kidney disease: Secondary | ICD-10-CM | POA: Diagnosis not present

## 2023-03-07 DIAGNOSIS — R1310 Dysphagia, unspecified: Secondary | ICD-10-CM | POA: Diagnosis not present

## 2023-03-07 DIAGNOSIS — N184 Chronic kidney disease, stage 4 (severe): Secondary | ICD-10-CM | POA: Diagnosis not present

## 2023-03-08 DIAGNOSIS — N184 Chronic kidney disease, stage 4 (severe): Secondary | ICD-10-CM | POA: Diagnosis not present

## 2023-03-08 DIAGNOSIS — I129 Hypertensive chronic kidney disease with stage 1 through stage 4 chronic kidney disease, or unspecified chronic kidney disease: Secondary | ICD-10-CM | POA: Diagnosis not present

## 2023-03-08 DIAGNOSIS — D631 Anemia in chronic kidney disease: Secondary | ICD-10-CM | POA: Diagnosis not present

## 2023-03-08 DIAGNOSIS — Z992 Dependence on renal dialysis: Secondary | ICD-10-CM | POA: Diagnosis not present

## 2023-03-08 DIAGNOSIS — N186 End stage renal disease: Secondary | ICD-10-CM | POA: Diagnosis not present

## 2023-03-08 DIAGNOSIS — J449 Chronic obstructive pulmonary disease, unspecified: Secondary | ICD-10-CM | POA: Diagnosis not present

## 2023-03-08 DIAGNOSIS — E1122 Type 2 diabetes mellitus with diabetic chronic kidney disease: Secondary | ICD-10-CM | POA: Diagnosis not present

## 2023-03-09 DIAGNOSIS — Z4682 Encounter for fitting and adjustment of non-vascular catheter: Secondary | ICD-10-CM | POA: Diagnosis not present

## 2023-03-09 DIAGNOSIS — I82403 Acute embolism and thrombosis of unspecified deep veins of lower extremity, bilateral: Secondary | ICD-10-CM | POA: Diagnosis not present

## 2023-03-09 DIAGNOSIS — J449 Chronic obstructive pulmonary disease, unspecified: Secondary | ICD-10-CM | POA: Diagnosis not present

## 2023-03-09 DIAGNOSIS — N184 Chronic kidney disease, stage 4 (severe): Secondary | ICD-10-CM | POA: Diagnosis not present

## 2023-03-09 DIAGNOSIS — Z452 Encounter for adjustment and management of vascular access device: Secondary | ICD-10-CM | POA: Diagnosis not present

## 2023-03-09 DIAGNOSIS — R1310 Dysphagia, unspecified: Secondary | ICD-10-CM | POA: Diagnosis not present

## 2023-03-10 DIAGNOSIS — Z992 Dependence on renal dialysis: Secondary | ICD-10-CM | POA: Diagnosis not present

## 2023-03-10 DIAGNOSIS — N186 End stage renal disease: Secondary | ICD-10-CM | POA: Diagnosis not present

## 2023-03-10 DIAGNOSIS — I5082 Biventricular heart failure: Secondary | ICD-10-CM | POA: Diagnosis not present

## 2023-03-10 DIAGNOSIS — R1310 Dysphagia, unspecified: Secondary | ICD-10-CM | POA: Diagnosis not present

## 2023-03-10 DIAGNOSIS — D631 Anemia in chronic kidney disease: Secondary | ICD-10-CM | POA: Diagnosis not present

## 2023-03-10 DIAGNOSIS — N184 Chronic kidney disease, stage 4 (severe): Secondary | ICD-10-CM | POA: Diagnosis not present

## 2023-03-10 DIAGNOSIS — J449 Chronic obstructive pulmonary disease, unspecified: Secondary | ICD-10-CM | POA: Diagnosis not present

## 2023-03-11 DIAGNOSIS — E1122 Type 2 diabetes mellitus with diabetic chronic kidney disease: Secondary | ICD-10-CM | POA: Diagnosis not present

## 2023-03-11 DIAGNOSIS — I129 Hypertensive chronic kidney disease with stage 1 through stage 4 chronic kidney disease, or unspecified chronic kidney disease: Secondary | ICD-10-CM | POA: Diagnosis not present

## 2023-03-11 DIAGNOSIS — R1310 Dysphagia, unspecified: Secondary | ICD-10-CM | POA: Diagnosis not present

## 2023-03-11 DIAGNOSIS — N184 Chronic kidney disease, stage 4 (severe): Secondary | ICD-10-CM | POA: Diagnosis not present

## 2023-03-11 DIAGNOSIS — J449 Chronic obstructive pulmonary disease, unspecified: Secondary | ICD-10-CM | POA: Diagnosis not present

## 2023-03-12 DIAGNOSIS — N186 End stage renal disease: Secondary | ICD-10-CM | POA: Diagnosis not present

## 2023-03-12 DIAGNOSIS — N184 Chronic kidney disease, stage 4 (severe): Secondary | ICD-10-CM | POA: Diagnosis not present

## 2023-03-12 DIAGNOSIS — I82493 Acute embolism and thrombosis of other specified deep vein of lower extremity, bilateral: Secondary | ICD-10-CM | POA: Diagnosis not present

## 2023-03-12 DIAGNOSIS — I5020 Unspecified systolic (congestive) heart failure: Secondary | ICD-10-CM | POA: Diagnosis not present

## 2023-03-12 DIAGNOSIS — Z992 Dependence on renal dialysis: Secondary | ICD-10-CM | POA: Diagnosis not present

## 2023-03-12 DIAGNOSIS — E1122 Type 2 diabetes mellitus with diabetic chronic kidney disease: Secondary | ICD-10-CM | POA: Diagnosis not present

## 2023-03-12 DIAGNOSIS — D631 Anemia in chronic kidney disease: Secondary | ICD-10-CM | POA: Diagnosis not present

## 2023-03-12 DIAGNOSIS — R1310 Dysphagia, unspecified: Secondary | ICD-10-CM | POA: Diagnosis not present

## 2023-03-13 DIAGNOSIS — E1122 Type 2 diabetes mellitus with diabetic chronic kidney disease: Secondary | ICD-10-CM | POA: Diagnosis not present

## 2023-03-13 DIAGNOSIS — I5020 Unspecified systolic (congestive) heart failure: Secondary | ICD-10-CM | POA: Diagnosis not present

## 2023-03-13 DIAGNOSIS — N184 Chronic kidney disease, stage 4 (severe): Secondary | ICD-10-CM | POA: Diagnosis not present

## 2023-03-13 DIAGNOSIS — K219 Gastro-esophageal reflux disease without esophagitis: Secondary | ICD-10-CM | POA: Diagnosis not present

## 2023-03-13 DIAGNOSIS — R1312 Dysphagia, oropharyngeal phase: Secondary | ICD-10-CM | POA: Diagnosis not present

## 2023-03-13 DIAGNOSIS — I82493 Acute embolism and thrombosis of other specified deep vein of lower extremity, bilateral: Secondary | ICD-10-CM | POA: Diagnosis not present

## 2023-03-13 DIAGNOSIS — T17908A Unspecified foreign body in respiratory tract, part unspecified causing other injury, initial encounter: Secondary | ICD-10-CM | POA: Diagnosis not present

## 2023-03-13 DIAGNOSIS — R1310 Dysphagia, unspecified: Secondary | ICD-10-CM | POA: Diagnosis not present

## 2023-03-14 DIAGNOSIS — I82493 Acute embolism and thrombosis of other specified deep vein of lower extremity, bilateral: Secondary | ICD-10-CM | POA: Diagnosis not present

## 2023-03-14 DIAGNOSIS — D631 Anemia in chronic kidney disease: Secondary | ICD-10-CM | POA: Diagnosis not present

## 2023-03-14 DIAGNOSIS — Z4682 Encounter for fitting and adjustment of non-vascular catheter: Secondary | ICD-10-CM | POA: Diagnosis not present

## 2023-03-14 DIAGNOSIS — Z7901 Long term (current) use of anticoagulants: Secondary | ICD-10-CM | POA: Diagnosis not present

## 2023-03-14 DIAGNOSIS — Z992 Dependence on renal dialysis: Secondary | ICD-10-CM | POA: Diagnosis not present

## 2023-03-14 DIAGNOSIS — Z452 Encounter for adjustment and management of vascular access device: Secondary | ICD-10-CM | POA: Diagnosis not present

## 2023-03-14 DIAGNOSIS — R1310 Dysphagia, unspecified: Secondary | ICD-10-CM | POA: Diagnosis not present

## 2023-03-14 DIAGNOSIS — J690 Pneumonitis due to inhalation of food and vomit: Secondary | ICD-10-CM | POA: Diagnosis not present

## 2023-03-14 DIAGNOSIS — I5020 Unspecified systolic (congestive) heart failure: Secondary | ICD-10-CM | POA: Diagnosis not present

## 2023-03-14 DIAGNOSIS — R1312 Dysphagia, oropharyngeal phase: Secondary | ICD-10-CM | POA: Diagnosis not present

## 2023-03-14 DIAGNOSIS — N184 Chronic kidney disease, stage 4 (severe): Secondary | ICD-10-CM | POA: Diagnosis not present

## 2023-03-14 DIAGNOSIS — N186 End stage renal disease: Secondary | ICD-10-CM | POA: Diagnosis not present

## 2023-03-14 DIAGNOSIS — E1122 Type 2 diabetes mellitus with diabetic chronic kidney disease: Secondary | ICD-10-CM | POA: Diagnosis not present

## 2023-03-15 DIAGNOSIS — E1122 Type 2 diabetes mellitus with diabetic chronic kidney disease: Secondary | ICD-10-CM | POA: Diagnosis not present

## 2023-03-15 DIAGNOSIS — I130 Hypertensive heart and chronic kidney disease with heart failure and stage 1 through stage 4 chronic kidney disease, or unspecified chronic kidney disease: Secondary | ICD-10-CM | POA: Diagnosis not present

## 2023-03-15 DIAGNOSIS — N184 Chronic kidney disease, stage 4 (severe): Secondary | ICD-10-CM | POA: Diagnosis not present

## 2023-03-15 DIAGNOSIS — I82493 Acute embolism and thrombosis of other specified deep vein of lower extremity, bilateral: Secondary | ICD-10-CM | POA: Diagnosis not present

## 2023-03-15 DIAGNOSIS — R1310 Dysphagia, unspecified: Secondary | ICD-10-CM | POA: Diagnosis not present

## 2023-03-15 DIAGNOSIS — I5020 Unspecified systolic (congestive) heart failure: Secondary | ICD-10-CM | POA: Diagnosis not present

## 2023-03-16 DIAGNOSIS — Z431 Encounter for attention to gastrostomy: Secondary | ICD-10-CM | POA: Diagnosis not present

## 2023-03-16 DIAGNOSIS — J811 Chronic pulmonary edema: Secondary | ICD-10-CM | POA: Diagnosis not present

## 2023-03-16 DIAGNOSIS — R1310 Dysphagia, unspecified: Secondary | ICD-10-CM | POA: Diagnosis not present

## 2023-03-16 DIAGNOSIS — R0902 Hypoxemia: Secondary | ICD-10-CM | POA: Diagnosis not present

## 2023-03-16 DIAGNOSIS — D631 Anemia in chronic kidney disease: Secondary | ICD-10-CM | POA: Diagnosis not present

## 2023-03-16 DIAGNOSIS — N186 End stage renal disease: Secondary | ICD-10-CM | POA: Diagnosis not present

## 2023-03-16 DIAGNOSIS — J449 Chronic obstructive pulmonary disease, unspecified: Secondary | ICD-10-CM | POA: Diagnosis not present

## 2023-03-16 DIAGNOSIS — R051 Acute cough: Secondary | ICD-10-CM | POA: Diagnosis not present

## 2023-03-16 DIAGNOSIS — J9601 Acute respiratory failure with hypoxia: Secondary | ICD-10-CM | POA: Diagnosis not present

## 2023-03-16 DIAGNOSIS — R633 Feeding difficulties, unspecified: Secondary | ICD-10-CM | POA: Diagnosis not present

## 2023-03-16 DIAGNOSIS — Z992 Dependence on renal dialysis: Secondary | ICD-10-CM | POA: Diagnosis not present

## 2023-03-16 DIAGNOSIS — Z931 Gastrostomy status: Secondary | ICD-10-CM | POA: Diagnosis not present

## 2023-03-17 DIAGNOSIS — I252 Old myocardial infarction: Secondary | ICD-10-CM | POA: Diagnosis not present

## 2023-03-17 DIAGNOSIS — M6281 Muscle weakness (generalized): Secondary | ICD-10-CM | POA: Diagnosis not present

## 2023-03-17 DIAGNOSIS — Z931 Gastrostomy status: Secondary | ICD-10-CM | POA: Diagnosis not present

## 2023-03-17 DIAGNOSIS — R058 Other specified cough: Secondary | ICD-10-CM | POA: Diagnosis not present

## 2023-03-17 DIAGNOSIS — E440 Moderate protein-calorie malnutrition: Secondary | ICD-10-CM | POA: Diagnosis not present

## 2023-03-17 DIAGNOSIS — M549 Dorsalgia, unspecified: Secondary | ICD-10-CM | POA: Diagnosis not present

## 2023-03-17 DIAGNOSIS — Z743 Need for continuous supervision: Secondary | ICD-10-CM | POA: Diagnosis not present

## 2023-03-17 DIAGNOSIS — I2510 Atherosclerotic heart disease of native coronary artery without angina pectoris: Secondary | ICD-10-CM | POA: Diagnosis not present

## 2023-03-17 DIAGNOSIS — J15212 Pneumonia due to Methicillin resistant Staphylococcus aureus: Secondary | ICD-10-CM | POA: Diagnosis not present

## 2023-03-17 DIAGNOSIS — Z86718 Personal history of other venous thrombosis and embolism: Secondary | ICD-10-CM | POA: Diagnosis not present

## 2023-03-17 DIAGNOSIS — R5381 Other malaise: Secondary | ICD-10-CM | POA: Diagnosis not present

## 2023-03-17 DIAGNOSIS — D631 Anemia in chronic kidney disease: Secondary | ICD-10-CM | POA: Diagnosis not present

## 2023-03-17 DIAGNOSIS — K219 Gastro-esophageal reflux disease without esophagitis: Secondary | ICD-10-CM | POA: Diagnosis not present

## 2023-03-17 DIAGNOSIS — J069 Acute upper respiratory infection, unspecified: Secondary | ICD-10-CM | POA: Diagnosis not present

## 2023-03-17 DIAGNOSIS — I132 Hypertensive heart and chronic kidney disease with heart failure and with stage 5 chronic kidney disease, or end stage renal disease: Secondary | ICD-10-CM | POA: Diagnosis not present

## 2023-03-17 DIAGNOSIS — C4440 Unspecified malignant neoplasm of skin of scalp and neck: Secondary | ICD-10-CM | POA: Diagnosis not present

## 2023-03-17 DIAGNOSIS — L299 Pruritus, unspecified: Secondary | ICD-10-CM | POA: Diagnosis not present

## 2023-03-17 DIAGNOSIS — Z8673 Personal history of transient ischemic attack (TIA), and cerebral infarction without residual deficits: Secondary | ICD-10-CM | POA: Diagnosis not present

## 2023-03-17 DIAGNOSIS — E8770 Fluid overload, unspecified: Secondary | ICD-10-CM | POA: Diagnosis not present

## 2023-03-17 DIAGNOSIS — R0602 Shortness of breath: Secondary | ICD-10-CM | POA: Diagnosis not present

## 2023-03-17 DIAGNOSIS — Z794 Long term (current) use of insulin: Secondary | ICD-10-CM | POA: Diagnosis not present

## 2023-03-17 DIAGNOSIS — R059 Cough, unspecified: Secondary | ICD-10-CM | POA: Diagnosis not present

## 2023-03-17 DIAGNOSIS — E7881 Lipoid dermatoarthritis: Secondary | ICD-10-CM | POA: Diagnosis not present

## 2023-03-17 DIAGNOSIS — N179 Acute kidney failure, unspecified: Secondary | ICD-10-CM | POA: Diagnosis not present

## 2023-03-17 DIAGNOSIS — R918 Other nonspecific abnormal finding of lung field: Secondary | ICD-10-CM | POA: Diagnosis not present

## 2023-03-17 DIAGNOSIS — J449 Chronic obstructive pulmonary disease, unspecified: Secondary | ICD-10-CM | POA: Diagnosis not present

## 2023-03-17 DIAGNOSIS — I5022 Chronic systolic (congestive) heart failure: Secondary | ICD-10-CM | POA: Diagnosis not present

## 2023-03-17 DIAGNOSIS — D509 Iron deficiency anemia, unspecified: Secondary | ICD-10-CM | POA: Diagnosis not present

## 2023-03-17 DIAGNOSIS — G2581 Restless legs syndrome: Secondary | ICD-10-CM | POA: Diagnosis not present

## 2023-03-17 DIAGNOSIS — Z8679 Personal history of other diseases of the circulatory system: Secondary | ICD-10-CM | POA: Diagnosis not present

## 2023-03-17 DIAGNOSIS — Z992 Dependence on renal dialysis: Secondary | ICD-10-CM | POA: Diagnosis not present

## 2023-03-17 DIAGNOSIS — I953 Hypotension of hemodialysis: Secondary | ICD-10-CM | POA: Diagnosis not present

## 2023-03-17 DIAGNOSIS — N189 Chronic kidney disease, unspecified: Secondary | ICD-10-CM | POA: Diagnosis not present

## 2023-03-17 DIAGNOSIS — I5020 Unspecified systolic (congestive) heart failure: Secondary | ICD-10-CM | POA: Diagnosis not present

## 2023-03-17 DIAGNOSIS — R9389 Abnormal findings on diagnostic imaging of other specified body structures: Secondary | ICD-10-CM | POA: Diagnosis not present

## 2023-03-17 DIAGNOSIS — N184 Chronic kidney disease, stage 4 (severe): Secondary | ICD-10-CM | POA: Diagnosis not present

## 2023-03-17 DIAGNOSIS — R1319 Other dysphagia: Secondary | ICD-10-CM | POA: Diagnosis not present

## 2023-03-17 DIAGNOSIS — E1122 Type 2 diabetes mellitus with diabetic chronic kidney disease: Secondary | ICD-10-CM | POA: Diagnosis not present

## 2023-03-17 DIAGNOSIS — R194 Change in bowel habit: Secondary | ICD-10-CM | POA: Diagnosis not present

## 2023-03-17 DIAGNOSIS — N186 End stage renal disease: Secondary | ICD-10-CM | POA: Diagnosis not present

## 2023-03-17 DIAGNOSIS — J441 Chronic obstructive pulmonary disease with (acute) exacerbation: Secondary | ICD-10-CM | POA: Diagnosis not present

## 2023-03-17 DIAGNOSIS — E871 Hypo-osmolality and hyponatremia: Secondary | ICD-10-CM | POA: Diagnosis not present

## 2023-03-17 DIAGNOSIS — R093 Abnormal sputum: Secondary | ICD-10-CM | POA: Diagnosis not present

## 2023-03-17 DIAGNOSIS — Z1152 Encounter for screening for COVID-19: Secondary | ICD-10-CM | POA: Diagnosis not present

## 2023-03-17 DIAGNOSIS — K2100 Gastro-esophageal reflux disease with esophagitis, without bleeding: Secondary | ICD-10-CM | POA: Diagnosis not present

## 2023-03-19 DIAGNOSIS — N186 End stage renal disease: Secondary | ICD-10-CM | POA: Diagnosis not present

## 2023-03-19 DIAGNOSIS — D631 Anemia in chronic kidney disease: Secondary | ICD-10-CM | POA: Diagnosis not present

## 2023-03-19 DIAGNOSIS — Z992 Dependence on renal dialysis: Secondary | ICD-10-CM | POA: Diagnosis not present

## 2023-03-20 DIAGNOSIS — Z992 Dependence on renal dialysis: Secondary | ICD-10-CM | POA: Diagnosis not present

## 2023-03-20 DIAGNOSIS — D631 Anemia in chronic kidney disease: Secondary | ICD-10-CM | POA: Diagnosis not present

## 2023-03-20 DIAGNOSIS — N186 End stage renal disease: Secondary | ICD-10-CM | POA: Diagnosis not present

## 2023-03-21 DIAGNOSIS — R918 Other nonspecific abnormal finding of lung field: Secondary | ICD-10-CM | POA: Diagnosis not present

## 2023-03-21 DIAGNOSIS — R0602 Shortness of breath: Secondary | ICD-10-CM | POA: Diagnosis not present

## 2023-03-21 DIAGNOSIS — D631 Anemia in chronic kidney disease: Secondary | ICD-10-CM | POA: Diagnosis not present

## 2023-03-21 DIAGNOSIS — N186 End stage renal disease: Secondary | ICD-10-CM | POA: Diagnosis not present

## 2023-03-21 DIAGNOSIS — Z992 Dependence on renal dialysis: Secondary | ICD-10-CM | POA: Diagnosis not present

## 2023-03-22 DIAGNOSIS — D631 Anemia in chronic kidney disease: Secondary | ICD-10-CM | POA: Diagnosis not present

## 2023-03-22 DIAGNOSIS — N186 End stage renal disease: Secondary | ICD-10-CM | POA: Diagnosis not present

## 2023-03-22 DIAGNOSIS — Z992 Dependence on renal dialysis: Secondary | ICD-10-CM | POA: Diagnosis not present

## 2023-03-23 DIAGNOSIS — Z992 Dependence on renal dialysis: Secondary | ICD-10-CM | POA: Diagnosis not present

## 2023-03-23 DIAGNOSIS — N186 End stage renal disease: Secondary | ICD-10-CM | POA: Diagnosis not present

## 2023-03-23 DIAGNOSIS — D631 Anemia in chronic kidney disease: Secondary | ICD-10-CM | POA: Diagnosis not present

## 2023-03-26 DIAGNOSIS — Z992 Dependence on renal dialysis: Secondary | ICD-10-CM | POA: Diagnosis not present

## 2023-03-26 DIAGNOSIS — D631 Anemia in chronic kidney disease: Secondary | ICD-10-CM | POA: Diagnosis not present

## 2023-03-26 DIAGNOSIS — N186 End stage renal disease: Secondary | ICD-10-CM | POA: Diagnosis not present

## 2023-03-27 DIAGNOSIS — N186 End stage renal disease: Secondary | ICD-10-CM | POA: Diagnosis not present

## 2023-03-27 DIAGNOSIS — D631 Anemia in chronic kidney disease: Secondary | ICD-10-CM | POA: Diagnosis not present

## 2023-03-27 DIAGNOSIS — Z992 Dependence on renal dialysis: Secondary | ICD-10-CM | POA: Diagnosis not present

## 2023-04-02 DIAGNOSIS — N2581 Secondary hyperparathyroidism of renal origin: Secondary | ICD-10-CM | POA: Diagnosis not present

## 2023-04-02 DIAGNOSIS — N186 End stage renal disease: Secondary | ICD-10-CM | POA: Diagnosis not present

## 2023-04-02 DIAGNOSIS — Z992 Dependence on renal dialysis: Secondary | ICD-10-CM | POA: Diagnosis not present

## 2023-04-03 DIAGNOSIS — N186 End stage renal disease: Secondary | ICD-10-CM | POA: Diagnosis not present

## 2023-04-03 DIAGNOSIS — E1165 Type 2 diabetes mellitus with hyperglycemia: Secondary | ICD-10-CM | POA: Diagnosis not present

## 2023-04-03 DIAGNOSIS — J439 Emphysema, unspecified: Secondary | ICD-10-CM | POA: Diagnosis not present

## 2023-04-03 DIAGNOSIS — Z299 Encounter for prophylactic measures, unspecified: Secondary | ICD-10-CM | POA: Diagnosis not present

## 2023-04-03 DIAGNOSIS — E1140 Type 2 diabetes mellitus with diabetic neuropathy, unspecified: Secondary | ICD-10-CM | POA: Diagnosis not present

## 2023-04-03 DIAGNOSIS — Z09 Encounter for follow-up examination after completed treatment for conditions other than malignant neoplasm: Secondary | ICD-10-CM | POA: Diagnosis not present

## 2023-04-03 DIAGNOSIS — I10 Essential (primary) hypertension: Secondary | ICD-10-CM | POA: Diagnosis not present

## 2023-04-03 DIAGNOSIS — B351 Tinea unguium: Secondary | ICD-10-CM | POA: Diagnosis not present

## 2023-04-04 ENCOUNTER — Ambulatory Visit: Payer: Medicare Other | Admitting: Internal Medicine

## 2023-04-04 DIAGNOSIS — R6521 Severe sepsis with septic shock: Secondary | ICD-10-CM | POA: Diagnosis not present

## 2023-04-04 DIAGNOSIS — A419 Sepsis, unspecified organism: Secondary | ICD-10-CM | POA: Diagnosis not present

## 2023-04-04 DIAGNOSIS — R062 Wheezing: Secondary | ICD-10-CM | POA: Diagnosis not present

## 2023-04-04 DIAGNOSIS — F329 Major depressive disorder, single episode, unspecified: Secondary | ICD-10-CM | POA: Diagnosis not present

## 2023-04-04 DIAGNOSIS — J810 Acute pulmonary edema: Secondary | ICD-10-CM | POA: Diagnosis not present

## 2023-04-04 DIAGNOSIS — I471 Supraventricular tachycardia, unspecified: Secondary | ICD-10-CM | POA: Diagnosis not present

## 2023-04-04 DIAGNOSIS — R0609 Other forms of dyspnea: Secondary | ICD-10-CM | POA: Diagnosis not present

## 2023-04-04 DIAGNOSIS — R1310 Dysphagia, unspecified: Secondary | ICD-10-CM | POA: Diagnosis not present

## 2023-04-04 DIAGNOSIS — I340 Nonrheumatic mitral (valve) insufficiency: Secondary | ICD-10-CM | POA: Diagnosis not present

## 2023-04-04 DIAGNOSIS — D631 Anemia in chronic kidney disease: Secondary | ICD-10-CM | POA: Diagnosis not present

## 2023-04-04 DIAGNOSIS — Z931 Gastrostomy status: Secondary | ICD-10-CM | POA: Diagnosis not present

## 2023-04-04 DIAGNOSIS — J9690 Respiratory failure, unspecified, unspecified whether with hypoxia or hypercapnia: Secondary | ICD-10-CM | POA: Diagnosis not present

## 2023-04-04 DIAGNOSIS — Z934 Other artificial openings of gastrointestinal tract status: Secondary | ICD-10-CM | POA: Diagnosis not present

## 2023-04-04 DIAGNOSIS — R0902 Hypoxemia: Secondary | ICD-10-CM | POA: Diagnosis not present

## 2023-04-04 DIAGNOSIS — D75839 Thrombocytosis, unspecified: Secondary | ICD-10-CM | POA: Diagnosis not present

## 2023-04-04 DIAGNOSIS — F32A Depression, unspecified: Secondary | ICD-10-CM | POA: Diagnosis not present

## 2023-04-04 DIAGNOSIS — R000 Tachycardia, unspecified: Secondary | ICD-10-CM | POA: Diagnosis not present

## 2023-04-04 DIAGNOSIS — R2681 Unsteadiness on feet: Secondary | ICD-10-CM | POA: Diagnosis not present

## 2023-04-04 DIAGNOSIS — I132 Hypertensive heart and chronic kidney disease with heart failure and with stage 5 chronic kidney disease, or end stage renal disease: Secondary | ICD-10-CM | POA: Diagnosis not present

## 2023-04-04 DIAGNOSIS — I272 Pulmonary hypertension, unspecified: Secondary | ICD-10-CM | POA: Diagnosis not present

## 2023-04-04 DIAGNOSIS — I429 Cardiomyopathy, unspecified: Secondary | ICD-10-CM | POA: Diagnosis not present

## 2023-04-04 DIAGNOSIS — R079 Chest pain, unspecified: Secondary | ICD-10-CM | POA: Diagnosis not present

## 2023-04-04 DIAGNOSIS — J441 Chronic obstructive pulmonary disease with (acute) exacerbation: Secondary | ICD-10-CM | POA: Diagnosis not present

## 2023-04-04 DIAGNOSIS — E1122 Type 2 diabetes mellitus with diabetic chronic kidney disease: Secondary | ICD-10-CM | POA: Diagnosis not present

## 2023-04-04 DIAGNOSIS — Z20822 Contact with and (suspected) exposure to covid-19: Secondary | ICD-10-CM | POA: Diagnosis not present

## 2023-04-04 DIAGNOSIS — I10 Essential (primary) hypertension: Secondary | ICD-10-CM | POA: Diagnosis not present

## 2023-04-04 DIAGNOSIS — N186 End stage renal disease: Secondary | ICD-10-CM | POA: Diagnosis not present

## 2023-04-04 DIAGNOSIS — R739 Hyperglycemia, unspecified: Secondary | ICD-10-CM | POA: Diagnosis not present

## 2023-04-04 DIAGNOSIS — E871 Hypo-osmolality and hyponatremia: Secondary | ICD-10-CM | POA: Diagnosis not present

## 2023-04-04 DIAGNOSIS — Y844 Aspiration of fluid as the cause of abnormal reaction of the patient, or of later complication, without mention of misadventure at the time of the procedure: Secondary | ICD-10-CM | POA: Diagnosis not present

## 2023-04-04 DIAGNOSIS — R918 Other nonspecific abnormal finding of lung field: Secondary | ICD-10-CM | POA: Diagnosis not present

## 2023-04-04 DIAGNOSIS — I509 Heart failure, unspecified: Secondary | ICD-10-CM | POA: Diagnosis not present

## 2023-04-04 DIAGNOSIS — F419 Anxiety disorder, unspecified: Secondary | ICD-10-CM | POA: Diagnosis not present

## 2023-04-04 DIAGNOSIS — I2510 Atherosclerotic heart disease of native coronary artery without angina pectoris: Secondary | ICD-10-CM | POA: Diagnosis not present

## 2023-04-04 DIAGNOSIS — J9601 Acute respiratory failure with hypoxia: Secondary | ICD-10-CM | POA: Diagnosis not present

## 2023-04-04 DIAGNOSIS — I5022 Chronic systolic (congestive) heart failure: Secondary | ICD-10-CM | POA: Diagnosis not present

## 2023-04-04 DIAGNOSIS — Z743 Need for continuous supervision: Secondary | ICD-10-CM | POA: Diagnosis not present

## 2023-04-04 DIAGNOSIS — R0689 Other abnormalities of breathing: Secondary | ICD-10-CM | POA: Diagnosis not present

## 2023-04-04 DIAGNOSIS — J690 Pneumonitis due to inhalation of food and vomit: Secondary | ICD-10-CM | POA: Diagnosis not present

## 2023-04-04 DIAGNOSIS — Z992 Dependence on renal dialysis: Secondary | ICD-10-CM | POA: Diagnosis not present

## 2023-04-04 DIAGNOSIS — D696 Thrombocytopenia, unspecified: Secondary | ICD-10-CM | POA: Diagnosis not present

## 2023-04-04 DIAGNOSIS — Z86718 Personal history of other venous thrombosis and embolism: Secondary | ICD-10-CM | POA: Diagnosis not present

## 2023-04-04 DIAGNOSIS — J9600 Acute respiratory failure, unspecified whether with hypoxia or hypercapnia: Secondary | ICD-10-CM | POA: Diagnosis not present

## 2023-04-04 DIAGNOSIS — R404 Transient alteration of awareness: Secondary | ICD-10-CM | POA: Diagnosis not present

## 2023-04-04 DIAGNOSIS — Z9981 Dependence on supplemental oxygen: Secondary | ICD-10-CM | POA: Diagnosis not present

## 2023-04-04 DIAGNOSIS — J9602 Acute respiratory failure with hypercapnia: Secondary | ICD-10-CM | POA: Diagnosis not present

## 2023-04-04 DIAGNOSIS — Z9911 Dependence on respirator [ventilator] status: Secondary | ICD-10-CM | POA: Diagnosis not present

## 2023-04-04 DIAGNOSIS — J80 Acute respiratory distress syndrome: Secondary | ICD-10-CM | POA: Diagnosis not present

## 2023-04-04 DIAGNOSIS — Z91119 Patient's noncompliance with dietary regimen due to unspecified reason: Secondary | ICD-10-CM | POA: Diagnosis not present

## 2023-04-04 DIAGNOSIS — R0602 Shortness of breath: Secondary | ICD-10-CM | POA: Diagnosis not present

## 2023-04-04 DIAGNOSIS — J189 Pneumonia, unspecified organism: Secondary | ICD-10-CM | POA: Diagnosis not present

## 2023-04-05 DIAGNOSIS — N186 End stage renal disease: Secondary | ICD-10-CM | POA: Diagnosis not present

## 2023-04-05 DIAGNOSIS — D696 Thrombocytopenia, unspecified: Secondary | ICD-10-CM | POA: Diagnosis not present

## 2023-04-05 DIAGNOSIS — J189 Pneumonia, unspecified organism: Secondary | ICD-10-CM | POA: Diagnosis not present

## 2023-04-05 DIAGNOSIS — J9601 Acute respiratory failure with hypoxia: Secondary | ICD-10-CM | POA: Diagnosis not present

## 2023-04-05 DIAGNOSIS — A419 Sepsis, unspecified organism: Secondary | ICD-10-CM | POA: Diagnosis not present

## 2023-04-05 DIAGNOSIS — I509 Heart failure, unspecified: Secondary | ICD-10-CM | POA: Diagnosis not present

## 2023-04-05 DIAGNOSIS — J9690 Respiratory failure, unspecified, unspecified whether with hypoxia or hypercapnia: Secondary | ICD-10-CM | POA: Diagnosis not present

## 2023-04-05 DIAGNOSIS — J80 Acute respiratory distress syndrome: Secondary | ICD-10-CM | POA: Diagnosis not present

## 2023-04-05 DIAGNOSIS — E871 Hypo-osmolality and hyponatremia: Secondary | ICD-10-CM | POA: Diagnosis not present

## 2023-04-05 DIAGNOSIS — Z9911 Dependence on respirator [ventilator] status: Secondary | ICD-10-CM | POA: Diagnosis not present

## 2023-04-05 DIAGNOSIS — R1310 Dysphagia, unspecified: Secondary | ICD-10-CM | POA: Diagnosis not present

## 2023-04-05 DIAGNOSIS — J690 Pneumonitis due to inhalation of food and vomit: Secondary | ICD-10-CM | POA: Diagnosis not present

## 2023-04-05 DIAGNOSIS — R6521 Severe sepsis with septic shock: Secondary | ICD-10-CM | POA: Diagnosis not present

## 2023-04-06 DIAGNOSIS — J9690 Respiratory failure, unspecified, unspecified whether with hypoxia or hypercapnia: Secondary | ICD-10-CM | POA: Diagnosis not present

## 2023-04-06 DIAGNOSIS — R1310 Dysphagia, unspecified: Secondary | ICD-10-CM | POA: Diagnosis not present

## 2023-04-06 DIAGNOSIS — A419 Sepsis, unspecified organism: Secondary | ICD-10-CM | POA: Diagnosis not present

## 2023-04-06 DIAGNOSIS — J80 Acute respiratory distress syndrome: Secondary | ICD-10-CM | POA: Diagnosis not present

## 2023-04-06 DIAGNOSIS — J189 Pneumonia, unspecified organism: Secondary | ICD-10-CM | POA: Diagnosis not present

## 2023-04-06 DIAGNOSIS — N186 End stage renal disease: Secondary | ICD-10-CM | POA: Diagnosis not present

## 2023-04-06 DIAGNOSIS — D696 Thrombocytopenia, unspecified: Secondary | ICD-10-CM | POA: Diagnosis not present

## 2023-04-06 DIAGNOSIS — R6521 Severe sepsis with septic shock: Secondary | ICD-10-CM | POA: Diagnosis not present

## 2023-04-06 DIAGNOSIS — E871 Hypo-osmolality and hyponatremia: Secondary | ICD-10-CM | POA: Diagnosis not present

## 2023-04-06 DIAGNOSIS — J9601 Acute respiratory failure with hypoxia: Secondary | ICD-10-CM | POA: Diagnosis not present

## 2023-04-06 DIAGNOSIS — J690 Pneumonitis due to inhalation of food and vomit: Secondary | ICD-10-CM | POA: Diagnosis not present

## 2023-04-06 DIAGNOSIS — Z9911 Dependence on respirator [ventilator] status: Secondary | ICD-10-CM | POA: Diagnosis not present

## 2023-04-06 DIAGNOSIS — I509 Heart failure, unspecified: Secondary | ICD-10-CM | POA: Diagnosis not present

## 2023-04-07 DIAGNOSIS — J9601 Acute respiratory failure with hypoxia: Secondary | ICD-10-CM | POA: Diagnosis not present

## 2023-04-07 DIAGNOSIS — J690 Pneumonitis due to inhalation of food and vomit: Secondary | ICD-10-CM | POA: Diagnosis not present

## 2023-04-08 DIAGNOSIS — J9601 Acute respiratory failure with hypoxia: Secondary | ICD-10-CM | POA: Diagnosis not present

## 2023-04-08 DIAGNOSIS — J690 Pneumonitis due to inhalation of food and vomit: Secondary | ICD-10-CM | POA: Diagnosis not present

## 2023-04-09 DIAGNOSIS — J9601 Acute respiratory failure with hypoxia: Secondary | ICD-10-CM | POA: Diagnosis not present

## 2023-04-09 DIAGNOSIS — J690 Pneumonitis due to inhalation of food and vomit: Secondary | ICD-10-CM | POA: Diagnosis not present

## 2023-04-09 DIAGNOSIS — R0602 Shortness of breath: Secondary | ICD-10-CM | POA: Diagnosis not present

## 2023-04-10 DIAGNOSIS — E871 Hypo-osmolality and hyponatremia: Secondary | ICD-10-CM | POA: Diagnosis not present

## 2023-04-10 DIAGNOSIS — I509 Heart failure, unspecified: Secondary | ICD-10-CM | POA: Diagnosis not present

## 2023-04-10 DIAGNOSIS — R0602 Shortness of breath: Secondary | ICD-10-CM | POA: Diagnosis not present

## 2023-04-10 DIAGNOSIS — J9690 Respiratory failure, unspecified, unspecified whether with hypoxia or hypercapnia: Secondary | ICD-10-CM | POA: Diagnosis not present

## 2023-04-10 DIAGNOSIS — J9601 Acute respiratory failure with hypoxia: Secondary | ICD-10-CM | POA: Diagnosis not present

## 2023-04-10 DIAGNOSIS — N186 End stage renal disease: Secondary | ICD-10-CM | POA: Diagnosis not present

## 2023-04-10 DIAGNOSIS — J690 Pneumonitis due to inhalation of food and vomit: Secondary | ICD-10-CM | POA: Diagnosis not present

## 2023-04-11 DIAGNOSIS — J690 Pneumonitis due to inhalation of food and vomit: Secondary | ICD-10-CM | POA: Diagnosis not present

## 2023-04-11 DIAGNOSIS — Z743 Need for continuous supervision: Secondary | ICD-10-CM | POA: Diagnosis not present

## 2023-04-11 DIAGNOSIS — R0609 Other forms of dyspnea: Secondary | ICD-10-CM | POA: Diagnosis not present

## 2023-04-11 DIAGNOSIS — R2681 Unsteadiness on feet: Secondary | ICD-10-CM | POA: Diagnosis not present

## 2023-04-11 DIAGNOSIS — J9690 Respiratory failure, unspecified, unspecified whether with hypoxia or hypercapnia: Secondary | ICD-10-CM | POA: Diagnosis not present

## 2023-04-11 DIAGNOSIS — Z9981 Dependence on supplemental oxygen: Secondary | ICD-10-CM | POA: Diagnosis not present

## 2023-04-11 DIAGNOSIS — J9601 Acute respiratory failure with hypoxia: Secondary | ICD-10-CM | POA: Diagnosis not present

## 2023-04-11 DIAGNOSIS — Y844 Aspiration of fluid as the cause of abnormal reaction of the patient, or of later complication, without mention of misadventure at the time of the procedure: Secondary | ICD-10-CM | POA: Diagnosis not present

## 2023-04-11 DIAGNOSIS — R0602 Shortness of breath: Secondary | ICD-10-CM | POA: Diagnosis not present

## 2023-04-12 DIAGNOSIS — J80 Acute respiratory distress syndrome: Secondary | ICD-10-CM | POA: Diagnosis not present

## 2023-04-12 DIAGNOSIS — Z934 Other artificial openings of gastrointestinal tract status: Secondary | ICD-10-CM | POA: Diagnosis not present

## 2023-04-12 DIAGNOSIS — F329 Major depressive disorder, single episode, unspecified: Secondary | ICD-10-CM | POA: Diagnosis not present

## 2023-04-12 DIAGNOSIS — Z992 Dependence on renal dialysis: Secondary | ICD-10-CM | POA: Diagnosis not present

## 2023-04-12 DIAGNOSIS — J690 Pneumonitis due to inhalation of food and vomit: Secondary | ICD-10-CM | POA: Diagnosis not present

## 2023-04-12 DIAGNOSIS — J9601 Acute respiratory failure with hypoxia: Secondary | ICD-10-CM | POA: Diagnosis not present

## 2023-04-12 DIAGNOSIS — N186 End stage renal disease: Secondary | ICD-10-CM | POA: Diagnosis not present

## 2023-04-12 DIAGNOSIS — R1310 Dysphagia, unspecified: Secondary | ICD-10-CM | POA: Diagnosis not present

## 2023-04-13 DIAGNOSIS — N186 End stage renal disease: Secondary | ICD-10-CM | POA: Diagnosis not present

## 2023-04-13 DIAGNOSIS — Z934 Other artificial openings of gastrointestinal tract status: Secondary | ICD-10-CM | POA: Diagnosis not present

## 2023-04-13 DIAGNOSIS — Z992 Dependence on renal dialysis: Secondary | ICD-10-CM | POA: Diagnosis not present

## 2023-04-13 DIAGNOSIS — J80 Acute respiratory distress syndrome: Secondary | ICD-10-CM | POA: Diagnosis not present

## 2023-04-13 DIAGNOSIS — R1310 Dysphagia, unspecified: Secondary | ICD-10-CM | POA: Diagnosis not present

## 2023-04-13 DIAGNOSIS — J690 Pneumonitis due to inhalation of food and vomit: Secondary | ICD-10-CM | POA: Diagnosis not present

## 2023-04-13 DIAGNOSIS — J9601 Acute respiratory failure with hypoxia: Secondary | ICD-10-CM | POA: Diagnosis not present

## 2023-04-14 DIAGNOSIS — D631 Anemia in chronic kidney disease: Secondary | ICD-10-CM | POA: Diagnosis not present

## 2023-04-14 DIAGNOSIS — J80 Acute respiratory distress syndrome: Secondary | ICD-10-CM | POA: Diagnosis not present

## 2023-04-14 DIAGNOSIS — J690 Pneumonitis due to inhalation of food and vomit: Secondary | ICD-10-CM | POA: Diagnosis not present

## 2023-04-14 DIAGNOSIS — R1310 Dysphagia, unspecified: Secondary | ICD-10-CM | POA: Diagnosis not present

## 2023-04-14 DIAGNOSIS — J9601 Acute respiratory failure with hypoxia: Secondary | ICD-10-CM | POA: Diagnosis not present

## 2023-04-14 DIAGNOSIS — J9690 Respiratory failure, unspecified, unspecified whether with hypoxia or hypercapnia: Secondary | ICD-10-CM | POA: Diagnosis not present

## 2023-04-14 DIAGNOSIS — I509 Heart failure, unspecified: Secondary | ICD-10-CM | POA: Diagnosis not present

## 2023-04-14 DIAGNOSIS — N186 End stage renal disease: Secondary | ICD-10-CM | POA: Diagnosis not present

## 2023-04-14 DIAGNOSIS — Z992 Dependence on renal dialysis: Secondary | ICD-10-CM | POA: Diagnosis not present

## 2023-04-14 DIAGNOSIS — Z934 Other artificial openings of gastrointestinal tract status: Secondary | ICD-10-CM | POA: Diagnosis not present

## 2023-04-15 DIAGNOSIS — J690 Pneumonitis due to inhalation of food and vomit: Secondary | ICD-10-CM | POA: Diagnosis not present

## 2023-04-15 DIAGNOSIS — J9601 Acute respiratory failure with hypoxia: Secondary | ICD-10-CM | POA: Diagnosis not present

## 2023-04-16 DIAGNOSIS — J90 Pleural effusion, not elsewhere classified: Secondary | ICD-10-CM | POA: Diagnosis not present

## 2023-04-16 DIAGNOSIS — I10 Essential (primary) hypertension: Secondary | ICD-10-CM | POA: Diagnosis not present

## 2023-04-16 DIAGNOSIS — M79605 Pain in left leg: Secondary | ICD-10-CM | POA: Diagnosis not present

## 2023-04-16 DIAGNOSIS — F419 Anxiety disorder, unspecified: Secondary | ICD-10-CM | POA: Diagnosis present

## 2023-04-16 DIAGNOSIS — K219 Gastro-esophageal reflux disease without esophagitis: Secondary | ICD-10-CM | POA: Diagnosis present

## 2023-04-16 DIAGNOSIS — J690 Pneumonitis due to inhalation of food and vomit: Secondary | ICD-10-CM | POA: Diagnosis not present

## 2023-04-16 DIAGNOSIS — N186 End stage renal disease: Secondary | ICD-10-CM | POA: Diagnosis present

## 2023-04-16 DIAGNOSIS — J449 Chronic obstructive pulmonary disease, unspecified: Secondary | ICD-10-CM | POA: Diagnosis not present

## 2023-04-16 DIAGNOSIS — J9600 Acute respiratory failure, unspecified whether with hypoxia or hypercapnia: Secondary | ICD-10-CM | POA: Diagnosis not present

## 2023-04-16 DIAGNOSIS — R062 Wheezing: Secondary | ICD-10-CM | POA: Diagnosis not present

## 2023-04-16 DIAGNOSIS — F32A Depression, unspecified: Secondary | ICD-10-CM | POA: Diagnosis present

## 2023-04-16 DIAGNOSIS — E876 Hypokalemia: Secondary | ICD-10-CM | POA: Diagnosis present

## 2023-04-16 DIAGNOSIS — R000 Tachycardia, unspecified: Secondary | ICD-10-CM | POA: Diagnosis not present

## 2023-04-16 DIAGNOSIS — I5021 Acute systolic (congestive) heart failure: Secondary | ICD-10-CM | POA: Diagnosis not present

## 2023-04-16 DIAGNOSIS — D631 Anemia in chronic kidney disease: Secondary | ICD-10-CM | POA: Diagnosis not present

## 2023-04-16 DIAGNOSIS — Z992 Dependence on renal dialysis: Secondary | ICD-10-CM | POA: Diagnosis not present

## 2023-04-16 DIAGNOSIS — R0689 Other abnormalities of breathing: Secondary | ICD-10-CM | POA: Diagnosis not present

## 2023-04-16 DIAGNOSIS — Z20822 Contact with and (suspected) exposure to covid-19: Secondary | ICD-10-CM | POA: Diagnosis not present

## 2023-04-16 DIAGNOSIS — J810 Acute pulmonary edema: Secondary | ICD-10-CM | POA: Diagnosis not present

## 2023-04-16 DIAGNOSIS — J9690 Respiratory failure, unspecified, unspecified whether with hypoxia or hypercapnia: Secondary | ICD-10-CM | POA: Diagnosis not present

## 2023-04-16 DIAGNOSIS — I214 Non-ST elevation (NSTEMI) myocardial infarction: Secondary | ICD-10-CM | POA: Diagnosis present

## 2023-04-16 DIAGNOSIS — J9601 Acute respiratory failure with hypoxia: Secondary | ICD-10-CM | POA: Diagnosis present

## 2023-04-16 DIAGNOSIS — Z86718 Personal history of other venous thrombosis and embolism: Secondary | ICD-10-CM | POA: Diagnosis not present

## 2023-04-16 DIAGNOSIS — I429 Cardiomyopathy, unspecified: Secondary | ICD-10-CM | POA: Diagnosis present

## 2023-04-16 DIAGNOSIS — J441 Chronic obstructive pulmonary disease with (acute) exacerbation: Secondary | ICD-10-CM | POA: Diagnosis not present

## 2023-04-16 DIAGNOSIS — J811 Chronic pulmonary edema: Secondary | ICD-10-CM | POA: Diagnosis present

## 2023-04-16 DIAGNOSIS — Z87891 Personal history of nicotine dependence: Secondary | ICD-10-CM | POA: Diagnosis not present

## 2023-04-16 DIAGNOSIS — I132 Hypertensive heart and chronic kidney disease with heart failure and with stage 5 chronic kidney disease, or end stage renal disease: Secondary | ICD-10-CM | POA: Diagnosis present

## 2023-04-16 DIAGNOSIS — I509 Heart failure, unspecified: Secondary | ICD-10-CM | POA: Diagnosis not present

## 2023-04-16 DIAGNOSIS — I2510 Atherosclerotic heart disease of native coronary artery without angina pectoris: Secondary | ICD-10-CM | POA: Diagnosis present

## 2023-04-16 DIAGNOSIS — M79604 Pain in right leg: Secondary | ICD-10-CM | POA: Diagnosis not present

## 2023-04-17 DIAGNOSIS — J811 Chronic pulmonary edema: Secondary | ICD-10-CM | POA: Diagnosis present

## 2023-04-17 DIAGNOSIS — E876 Hypokalemia: Secondary | ICD-10-CM | POA: Diagnosis present

## 2023-04-17 DIAGNOSIS — I5021 Acute systolic (congestive) heart failure: Secondary | ICD-10-CM | POA: Diagnosis not present

## 2023-04-17 DIAGNOSIS — J90 Pleural effusion, not elsewhere classified: Secondary | ICD-10-CM | POA: Diagnosis not present

## 2023-04-17 DIAGNOSIS — Z86718 Personal history of other venous thrombosis and embolism: Secondary | ICD-10-CM | POA: Diagnosis not present

## 2023-04-17 DIAGNOSIS — J449 Chronic obstructive pulmonary disease, unspecified: Secondary | ICD-10-CM | POA: Diagnosis present

## 2023-04-17 DIAGNOSIS — N186 End stage renal disease: Secondary | ICD-10-CM | POA: Diagnosis present

## 2023-04-17 DIAGNOSIS — K219 Gastro-esophageal reflux disease without esophagitis: Secondary | ICD-10-CM | POA: Diagnosis present

## 2023-04-17 DIAGNOSIS — Z87891 Personal history of nicotine dependence: Secondary | ICD-10-CM | POA: Diagnosis not present

## 2023-04-17 DIAGNOSIS — I214 Non-ST elevation (NSTEMI) myocardial infarction: Secondary | ICD-10-CM | POA: Diagnosis present

## 2023-04-17 DIAGNOSIS — J9690 Respiratory failure, unspecified, unspecified whether with hypoxia or hypercapnia: Secondary | ICD-10-CM | POA: Diagnosis not present

## 2023-04-17 DIAGNOSIS — J810 Acute pulmonary edema: Secondary | ICD-10-CM | POA: Diagnosis not present

## 2023-04-17 DIAGNOSIS — M79604 Pain in right leg: Secondary | ICD-10-CM | POA: Diagnosis not present

## 2023-04-17 DIAGNOSIS — Z992 Dependence on renal dialysis: Secondary | ICD-10-CM | POA: Diagnosis not present

## 2023-04-17 DIAGNOSIS — I132 Hypertensive heart and chronic kidney disease with heart failure and with stage 5 chronic kidney disease, or end stage renal disease: Secondary | ICD-10-CM | POA: Diagnosis present

## 2023-04-17 DIAGNOSIS — M79605 Pain in left leg: Secondary | ICD-10-CM | POA: Diagnosis not present

## 2023-04-17 DIAGNOSIS — J9601 Acute respiratory failure with hypoxia: Secondary | ICD-10-CM | POA: Diagnosis present

## 2023-04-17 DIAGNOSIS — F419 Anxiety disorder, unspecified: Secondary | ICD-10-CM | POA: Diagnosis present

## 2023-04-17 DIAGNOSIS — F32A Depression, unspecified: Secondary | ICD-10-CM | POA: Diagnosis present

## 2023-04-17 DIAGNOSIS — I429 Cardiomyopathy, unspecified: Secondary | ICD-10-CM | POA: Diagnosis present

## 2023-04-17 DIAGNOSIS — I2510 Atherosclerotic heart disease of native coronary artery without angina pectoris: Secondary | ICD-10-CM | POA: Diagnosis present

## 2023-04-17 DIAGNOSIS — I509 Heart failure, unspecified: Secondary | ICD-10-CM | POA: Diagnosis present

## 2023-04-17 DIAGNOSIS — D631 Anemia in chronic kidney disease: Secondary | ICD-10-CM | POA: Diagnosis not present

## 2023-04-18 DIAGNOSIS — J449 Chronic obstructive pulmonary disease, unspecified: Secondary | ICD-10-CM | POA: Diagnosis not present

## 2023-04-18 DIAGNOSIS — I5021 Acute systolic (congestive) heart failure: Secondary | ICD-10-CM | POA: Diagnosis not present

## 2023-04-18 DIAGNOSIS — M79604 Pain in right leg: Secondary | ICD-10-CM | POA: Diagnosis not present

## 2023-04-18 DIAGNOSIS — M79605 Pain in left leg: Secondary | ICD-10-CM | POA: Diagnosis not present

## 2023-04-18 DIAGNOSIS — J810 Acute pulmonary edema: Secondary | ICD-10-CM | POA: Diagnosis not present

## 2023-04-19 DIAGNOSIS — J810 Acute pulmonary edema: Secondary | ICD-10-CM | POA: Diagnosis not present

## 2023-04-19 DIAGNOSIS — I5021 Acute systolic (congestive) heart failure: Secondary | ICD-10-CM | POA: Diagnosis not present

## 2023-04-19 DIAGNOSIS — I509 Heart failure, unspecified: Secondary | ICD-10-CM | POA: Diagnosis not present

## 2023-04-19 DIAGNOSIS — J9690 Respiratory failure, unspecified, unspecified whether with hypoxia or hypercapnia: Secondary | ICD-10-CM | POA: Diagnosis not present

## 2023-04-19 DIAGNOSIS — N186 End stage renal disease: Secondary | ICD-10-CM | POA: Diagnosis not present

## 2023-04-19 DIAGNOSIS — J449 Chronic obstructive pulmonary disease, unspecified: Secondary | ICD-10-CM | POA: Diagnosis not present

## 2023-04-19 DIAGNOSIS — I2510 Atherosclerotic heart disease of native coronary artery without angina pectoris: Secondary | ICD-10-CM | POA: Diagnosis not present

## 2023-04-20 DIAGNOSIS — I5021 Acute systolic (congestive) heart failure: Secondary | ICD-10-CM | POA: Diagnosis not present

## 2023-04-20 DIAGNOSIS — J810 Acute pulmonary edema: Secondary | ICD-10-CM | POA: Diagnosis not present

## 2023-04-20 DIAGNOSIS — J449 Chronic obstructive pulmonary disease, unspecified: Secondary | ICD-10-CM | POA: Diagnosis not present

## 2023-04-21 DIAGNOSIS — N186 End stage renal disease: Secondary | ICD-10-CM | POA: Diagnosis not present

## 2023-04-21 DIAGNOSIS — D509 Iron deficiency anemia, unspecified: Secondary | ICD-10-CM | POA: Diagnosis not present

## 2023-04-21 DIAGNOSIS — D631 Anemia in chronic kidney disease: Secondary | ICD-10-CM | POA: Diagnosis not present

## 2023-04-21 DIAGNOSIS — Z992 Dependence on renal dialysis: Secondary | ICD-10-CM | POA: Diagnosis not present

## 2023-04-21 DIAGNOSIS — N250 Renal osteodystrophy: Secondary | ICD-10-CM | POA: Diagnosis not present

## 2023-04-21 DIAGNOSIS — Z23 Encounter for immunization: Secondary | ICD-10-CM | POA: Diagnosis not present

## 2023-04-22 DIAGNOSIS — I10 Essential (primary) hypertension: Secondary | ICD-10-CM | POA: Diagnosis not present

## 2023-04-22 DIAGNOSIS — Z7902 Long term (current) use of antithrombotics/antiplatelets: Secondary | ICD-10-CM | POA: Diagnosis not present

## 2023-04-22 DIAGNOSIS — Z931 Gastrostomy status: Secondary | ICD-10-CM | POA: Diagnosis not present

## 2023-04-22 DIAGNOSIS — I214 Non-ST elevation (NSTEMI) myocardial infarction: Secondary | ICD-10-CM | POA: Diagnosis not present

## 2023-04-22 DIAGNOSIS — I5023 Acute on chronic systolic (congestive) heart failure: Secondary | ICD-10-CM | POA: Diagnosis not present

## 2023-04-22 DIAGNOSIS — J449 Chronic obstructive pulmonary disease, unspecified: Secondary | ICD-10-CM | POA: Diagnosis not present

## 2023-04-22 DIAGNOSIS — E785 Hyperlipidemia, unspecified: Secondary | ICD-10-CM | POA: Diagnosis not present

## 2023-04-22 DIAGNOSIS — I429 Cardiomyopathy, unspecified: Secondary | ICD-10-CM | POA: Diagnosis not present

## 2023-04-22 DIAGNOSIS — E1122 Type 2 diabetes mellitus with diabetic chronic kidney disease: Secondary | ICD-10-CM | POA: Diagnosis not present

## 2023-04-22 DIAGNOSIS — Z955 Presence of coronary angioplasty implant and graft: Secondary | ICD-10-CM | POA: Diagnosis not present

## 2023-04-22 DIAGNOSIS — J9690 Respiratory failure, unspecified, unspecified whether with hypoxia or hypercapnia: Secondary | ICD-10-CM | POA: Diagnosis not present

## 2023-04-22 DIAGNOSIS — Z01810 Encounter for preprocedural cardiovascular examination: Secondary | ICD-10-CM | POA: Diagnosis not present

## 2023-04-22 DIAGNOSIS — E1165 Type 2 diabetes mellitus with hyperglycemia: Secondary | ICD-10-CM | POA: Diagnosis not present

## 2023-04-22 DIAGNOSIS — Z86718 Personal history of other venous thrombosis and embolism: Secondary | ICD-10-CM | POA: Diagnosis not present

## 2023-04-22 DIAGNOSIS — N186 End stage renal disease: Secondary | ICD-10-CM | POA: Diagnosis not present

## 2023-04-22 DIAGNOSIS — Z992 Dependence on renal dialysis: Secondary | ICD-10-CM | POA: Diagnosis not present

## 2023-04-22 DIAGNOSIS — K219 Gastro-esophageal reflux disease without esophagitis: Secondary | ICD-10-CM | POA: Diagnosis not present

## 2023-04-22 DIAGNOSIS — R0689 Other abnormalities of breathing: Secondary | ICD-10-CM | POA: Diagnosis not present

## 2023-04-22 DIAGNOSIS — Z7982 Long term (current) use of aspirin: Secondary | ICD-10-CM | POA: Diagnosis not present

## 2023-04-22 DIAGNOSIS — I722 Aneurysm of renal artery: Secondary | ICD-10-CM | POA: Diagnosis not present

## 2023-04-22 DIAGNOSIS — E871 Hypo-osmolality and hyponatremia: Secondary | ICD-10-CM | POA: Diagnosis not present

## 2023-04-22 DIAGNOSIS — Z7984 Long term (current) use of oral hypoglycemic drugs: Secondary | ICD-10-CM | POA: Diagnosis not present

## 2023-04-22 DIAGNOSIS — F321 Major depressive disorder, single episode, moderate: Secondary | ICD-10-CM | POA: Diagnosis not present

## 2023-04-22 DIAGNOSIS — I132 Hypertensive heart and chronic kidney disease with heart failure and with stage 5 chronic kidney disease, or end stage renal disease: Secondary | ICD-10-CM | POA: Diagnosis not present

## 2023-04-22 DIAGNOSIS — I21A1 Myocardial infarction type 2: Secondary | ICD-10-CM | POA: Diagnosis not present

## 2023-04-22 DIAGNOSIS — R079 Chest pain, unspecified: Secondary | ICD-10-CM | POA: Diagnosis not present

## 2023-04-22 DIAGNOSIS — J9601 Acute respiratory failure with hypoxia: Secondary | ICD-10-CM | POA: Diagnosis not present

## 2023-04-22 DIAGNOSIS — D631 Anemia in chronic kidney disease: Secondary | ICD-10-CM | POA: Diagnosis not present

## 2023-04-22 DIAGNOSIS — R1310 Dysphagia, unspecified: Secondary | ICD-10-CM | POA: Diagnosis not present

## 2023-04-22 DIAGNOSIS — J9621 Acute and chronic respiratory failure with hypoxia: Secondary | ICD-10-CM | POA: Diagnosis not present

## 2023-04-22 DIAGNOSIS — J810 Acute pulmonary edema: Secondary | ICD-10-CM | POA: Diagnosis not present

## 2023-04-22 DIAGNOSIS — I2510 Atherosclerotic heart disease of native coronary artery without angina pectoris: Secondary | ICD-10-CM | POA: Diagnosis not present

## 2023-04-22 DIAGNOSIS — F419 Anxiety disorder, unspecified: Secondary | ICD-10-CM | POA: Diagnosis not present

## 2023-04-22 DIAGNOSIS — Z20822 Contact with and (suspected) exposure to covid-19: Secondary | ICD-10-CM | POA: Diagnosis not present

## 2023-04-22 DIAGNOSIS — I509 Heart failure, unspecified: Secondary | ICD-10-CM | POA: Diagnosis not present

## 2023-04-22 DIAGNOSIS — I340 Nonrheumatic mitral (valve) insufficiency: Secondary | ICD-10-CM | POA: Diagnosis not present

## 2023-04-22 DIAGNOSIS — J90 Pleural effusion, not elsewhere classified: Secondary | ICD-10-CM | POA: Diagnosis not present

## 2023-04-23 DIAGNOSIS — E785 Hyperlipidemia, unspecified: Secondary | ICD-10-CM | POA: Diagnosis not present

## 2023-04-23 DIAGNOSIS — I509 Heart failure, unspecified: Secondary | ICD-10-CM | POA: Diagnosis not present

## 2023-04-23 DIAGNOSIS — I2510 Atherosclerotic heart disease of native coronary artery without angina pectoris: Secondary | ICD-10-CM | POA: Diagnosis not present

## 2023-04-23 DIAGNOSIS — J9690 Respiratory failure, unspecified, unspecified whether with hypoxia or hypercapnia: Secondary | ICD-10-CM | POA: Diagnosis not present

## 2023-04-23 DIAGNOSIS — J9621 Acute and chronic respiratory failure with hypoxia: Secondary | ICD-10-CM | POA: Diagnosis not present

## 2023-04-23 DIAGNOSIS — N186 End stage renal disease: Secondary | ICD-10-CM | POA: Diagnosis not present

## 2023-04-23 DIAGNOSIS — I340 Nonrheumatic mitral (valve) insufficiency: Secondary | ICD-10-CM | POA: Diagnosis not present

## 2023-04-24 DIAGNOSIS — J9690 Respiratory failure, unspecified, unspecified whether with hypoxia or hypercapnia: Secondary | ICD-10-CM | POA: Diagnosis not present

## 2023-04-24 DIAGNOSIS — J9621 Acute and chronic respiratory failure with hypoxia: Secondary | ICD-10-CM | POA: Diagnosis not present

## 2023-04-24 DIAGNOSIS — I2510 Atherosclerotic heart disease of native coronary artery without angina pectoris: Secondary | ICD-10-CM | POA: Diagnosis not present

## 2023-04-24 DIAGNOSIS — N186 End stage renal disease: Secondary | ICD-10-CM | POA: Diagnosis not present

## 2023-04-24 DIAGNOSIS — I509 Heart failure, unspecified: Secondary | ICD-10-CM | POA: Diagnosis not present

## 2023-04-24 DIAGNOSIS — I722 Aneurysm of renal artery: Secondary | ICD-10-CM | POA: Diagnosis not present

## 2023-04-25 DIAGNOSIS — N186 End stage renal disease: Secondary | ICD-10-CM | POA: Diagnosis not present

## 2023-04-25 DIAGNOSIS — J9621 Acute and chronic respiratory failure with hypoxia: Secondary | ICD-10-CM | POA: Diagnosis not present

## 2023-04-25 DIAGNOSIS — E785 Hyperlipidemia, unspecified: Secondary | ICD-10-CM | POA: Diagnosis not present

## 2023-04-25 DIAGNOSIS — I340 Nonrheumatic mitral (valve) insufficiency: Secondary | ICD-10-CM | POA: Diagnosis not present

## 2023-04-25 DIAGNOSIS — I509 Heart failure, unspecified: Secondary | ICD-10-CM | POA: Diagnosis not present

## 2023-04-25 DIAGNOSIS — I2510 Atherosclerotic heart disease of native coronary artery without angina pectoris: Secondary | ICD-10-CM | POA: Diagnosis not present

## 2023-04-25 DIAGNOSIS — J9690 Respiratory failure, unspecified, unspecified whether with hypoxia or hypercapnia: Secondary | ICD-10-CM | POA: Diagnosis not present

## 2023-04-26 ENCOUNTER — Ambulatory Visit: Payer: Medicare Other | Admitting: Internal Medicine

## 2023-04-26 DIAGNOSIS — J9601 Acute respiratory failure with hypoxia: Secondary | ICD-10-CM | POA: Diagnosis not present

## 2023-04-26 DIAGNOSIS — I5023 Acute on chronic systolic (congestive) heart failure: Secondary | ICD-10-CM | POA: Diagnosis not present

## 2023-04-26 DIAGNOSIS — J9621 Acute and chronic respiratory failure with hypoxia: Secondary | ICD-10-CM | POA: Diagnosis not present

## 2023-04-26 DIAGNOSIS — Z01810 Encounter for preprocedural cardiovascular examination: Secondary | ICD-10-CM | POA: Diagnosis not present

## 2023-04-26 DIAGNOSIS — R1310 Dysphagia, unspecified: Secondary | ICD-10-CM | POA: Diagnosis not present

## 2023-04-26 DIAGNOSIS — I214 Non-ST elevation (NSTEMI) myocardial infarction: Secondary | ICD-10-CM | POA: Diagnosis not present

## 2023-04-26 DIAGNOSIS — N186 End stage renal disease: Secondary | ICD-10-CM | POA: Diagnosis not present

## 2023-04-26 DIAGNOSIS — Z992 Dependence on renal dialysis: Secondary | ICD-10-CM | POA: Diagnosis not present

## 2023-04-27 DIAGNOSIS — I10 Essential (primary) hypertension: Secondary | ICD-10-CM | POA: Diagnosis not present

## 2023-04-27 DIAGNOSIS — I5023 Acute on chronic systolic (congestive) heart failure: Secondary | ICD-10-CM | POA: Diagnosis not present

## 2023-04-27 DIAGNOSIS — Z992 Dependence on renal dialysis: Secondary | ICD-10-CM | POA: Diagnosis not present

## 2023-04-27 DIAGNOSIS — R1310 Dysphagia, unspecified: Secondary | ICD-10-CM | POA: Diagnosis not present

## 2023-04-27 DIAGNOSIS — J9690 Respiratory failure, unspecified, unspecified whether with hypoxia or hypercapnia: Secondary | ICD-10-CM | POA: Diagnosis not present

## 2023-04-27 DIAGNOSIS — I214 Non-ST elevation (NSTEMI) myocardial infarction: Secondary | ICD-10-CM | POA: Diagnosis not present

## 2023-04-27 DIAGNOSIS — N186 End stage renal disease: Secondary | ICD-10-CM | POA: Diagnosis not present

## 2023-04-27 DIAGNOSIS — D631 Anemia in chronic kidney disease: Secondary | ICD-10-CM | POA: Diagnosis not present

## 2023-04-27 DIAGNOSIS — J9601 Acute respiratory failure with hypoxia: Secondary | ICD-10-CM | POA: Diagnosis not present

## 2023-04-27 DIAGNOSIS — J9621 Acute and chronic respiratory failure with hypoxia: Secondary | ICD-10-CM | POA: Diagnosis not present

## 2023-04-28 DIAGNOSIS — J9621 Acute and chronic respiratory failure with hypoxia: Secondary | ICD-10-CM | POA: Diagnosis not present

## 2023-04-29 DIAGNOSIS — J9621 Acute and chronic respiratory failure with hypoxia: Secondary | ICD-10-CM | POA: Diagnosis not present

## 2023-04-30 DIAGNOSIS — D631 Anemia in chronic kidney disease: Secondary | ICD-10-CM | POA: Diagnosis not present

## 2023-04-30 DIAGNOSIS — I10 Essential (primary) hypertension: Secondary | ICD-10-CM | POA: Diagnosis not present

## 2023-04-30 DIAGNOSIS — J9690 Respiratory failure, unspecified, unspecified whether with hypoxia or hypercapnia: Secondary | ICD-10-CM | POA: Diagnosis not present

## 2023-04-30 DIAGNOSIS — J90 Pleural effusion, not elsewhere classified: Secondary | ICD-10-CM | POA: Diagnosis not present

## 2023-04-30 DIAGNOSIS — N186 End stage renal disease: Secondary | ICD-10-CM | POA: Diagnosis not present

## 2023-04-30 DIAGNOSIS — J9621 Acute and chronic respiratory failure with hypoxia: Secondary | ICD-10-CM | POA: Diagnosis not present

## 2023-05-01 DIAGNOSIS — Z992 Dependence on renal dialysis: Secondary | ICD-10-CM | POA: Diagnosis not present

## 2023-05-01 DIAGNOSIS — D631 Anemia in chronic kidney disease: Secondary | ICD-10-CM | POA: Diagnosis not present

## 2023-05-01 DIAGNOSIS — N186 End stage renal disease: Secondary | ICD-10-CM | POA: Diagnosis not present

## 2023-05-01 DIAGNOSIS — D509 Iron deficiency anemia, unspecified: Secondary | ICD-10-CM | POA: Diagnosis not present

## 2023-05-01 DIAGNOSIS — Z23 Encounter for immunization: Secondary | ICD-10-CM | POA: Diagnosis not present

## 2023-05-01 DIAGNOSIS — N250 Renal osteodystrophy: Secondary | ICD-10-CM | POA: Diagnosis not present

## 2023-05-03 DIAGNOSIS — N186 End stage renal disease: Secondary | ICD-10-CM | POA: Diagnosis not present

## 2023-05-03 DIAGNOSIS — Z23 Encounter for immunization: Secondary | ICD-10-CM | POA: Diagnosis not present

## 2023-05-03 DIAGNOSIS — D631 Anemia in chronic kidney disease: Secondary | ICD-10-CM | POA: Diagnosis not present

## 2023-05-03 DIAGNOSIS — Z992 Dependence on renal dialysis: Secondary | ICD-10-CM | POA: Diagnosis not present

## 2023-05-03 DIAGNOSIS — D509 Iron deficiency anemia, unspecified: Secondary | ICD-10-CM | POA: Diagnosis not present

## 2023-05-03 DIAGNOSIS — N250 Renal osteodystrophy: Secondary | ICD-10-CM | POA: Diagnosis not present

## 2023-05-05 DIAGNOSIS — N186 End stage renal disease: Secondary | ICD-10-CM | POA: Diagnosis not present

## 2023-05-05 DIAGNOSIS — N250 Renal osteodystrophy: Secondary | ICD-10-CM | POA: Diagnosis not present

## 2023-05-05 DIAGNOSIS — D631 Anemia in chronic kidney disease: Secondary | ICD-10-CM | POA: Diagnosis not present

## 2023-05-05 DIAGNOSIS — D509 Iron deficiency anemia, unspecified: Secondary | ICD-10-CM | POA: Diagnosis not present

## 2023-05-05 DIAGNOSIS — Z992 Dependence on renal dialysis: Secondary | ICD-10-CM | POA: Diagnosis not present

## 2023-05-05 DIAGNOSIS — Z23 Encounter for immunization: Secondary | ICD-10-CM | POA: Diagnosis not present

## 2023-05-07 DIAGNOSIS — I509 Heart failure, unspecified: Secondary | ICD-10-CM | POA: Diagnosis not present

## 2023-05-07 DIAGNOSIS — Z299 Encounter for prophylactic measures, unspecified: Secondary | ICD-10-CM | POA: Diagnosis not present

## 2023-05-07 DIAGNOSIS — I5022 Chronic systolic (congestive) heart failure: Secondary | ICD-10-CM | POA: Diagnosis not present

## 2023-05-07 DIAGNOSIS — J309 Allergic rhinitis, unspecified: Secondary | ICD-10-CM | POA: Diagnosis not present

## 2023-05-07 DIAGNOSIS — I2510 Atherosclerotic heart disease of native coronary artery without angina pectoris: Secondary | ICD-10-CM | POA: Diagnosis not present

## 2023-05-07 DIAGNOSIS — I10 Essential (primary) hypertension: Secondary | ICD-10-CM | POA: Diagnosis not present

## 2023-05-07 DIAGNOSIS — J9611 Chronic respiratory failure with hypoxia: Secondary | ICD-10-CM | POA: Diagnosis not present

## 2023-05-07 DIAGNOSIS — I25119 Atherosclerotic heart disease of native coronary artery with unspecified angina pectoris: Secondary | ICD-10-CM | POA: Diagnosis not present

## 2023-05-07 DIAGNOSIS — Z931 Gastrostomy status: Secondary | ICD-10-CM | POA: Diagnosis not present

## 2023-05-08 DIAGNOSIS — D631 Anemia in chronic kidney disease: Secondary | ICD-10-CM | POA: Diagnosis not present

## 2023-05-08 DIAGNOSIS — F419 Anxiety disorder, unspecified: Secondary | ICD-10-CM | POA: Diagnosis not present

## 2023-05-08 DIAGNOSIS — I2510 Atherosclerotic heart disease of native coronary artery without angina pectoris: Secondary | ICD-10-CM | POA: Diagnosis not present

## 2023-05-08 DIAGNOSIS — N185 Chronic kidney disease, stage 5: Secondary | ICD-10-CM | POA: Diagnosis not present

## 2023-05-08 DIAGNOSIS — E8770 Fluid overload, unspecified: Secondary | ICD-10-CM | POA: Diagnosis not present

## 2023-05-08 DIAGNOSIS — E1165 Type 2 diabetes mellitus with hyperglycemia: Secondary | ICD-10-CM | POA: Diagnosis not present

## 2023-05-08 DIAGNOSIS — I10 Essential (primary) hypertension: Secondary | ICD-10-CM | POA: Diagnosis not present

## 2023-05-08 DIAGNOSIS — R069 Unspecified abnormalities of breathing: Secondary | ICD-10-CM | POA: Diagnosis not present

## 2023-05-08 DIAGNOSIS — F32A Depression, unspecified: Secondary | ICD-10-CM | POA: Diagnosis not present

## 2023-05-08 DIAGNOSIS — J441 Chronic obstructive pulmonary disease with (acute) exacerbation: Secondary | ICD-10-CM | POA: Diagnosis not present

## 2023-05-08 DIAGNOSIS — I5021 Acute systolic (congestive) heart failure: Secondary | ICD-10-CM | POA: Diagnosis not present

## 2023-05-08 DIAGNOSIS — I132 Hypertensive heart and chronic kidney disease with heart failure and with stage 5 chronic kidney disease, or end stage renal disease: Secondary | ICD-10-CM | POA: Diagnosis not present

## 2023-05-08 DIAGNOSIS — K219 Gastro-esophageal reflux disease without esophagitis: Secondary | ICD-10-CM | POA: Diagnosis not present

## 2023-05-08 DIAGNOSIS — R0902 Hypoxemia: Secondary | ICD-10-CM | POA: Diagnosis not present

## 2023-05-08 DIAGNOSIS — J9621 Acute and chronic respiratory failure with hypoxia: Secondary | ICD-10-CM | POA: Diagnosis not present

## 2023-05-08 DIAGNOSIS — R0600 Dyspnea, unspecified: Secondary | ICD-10-CM | POA: Diagnosis not present

## 2023-05-08 DIAGNOSIS — E785 Hyperlipidemia, unspecified: Secondary | ICD-10-CM | POA: Diagnosis not present

## 2023-05-08 DIAGNOSIS — E872 Acidosis, unspecified: Secondary | ICD-10-CM | POA: Diagnosis not present

## 2023-05-08 DIAGNOSIS — D649 Anemia, unspecified: Secondary | ICD-10-CM | POA: Diagnosis not present

## 2023-05-08 DIAGNOSIS — I161 Hypertensive emergency: Secondary | ICD-10-CM | POA: Diagnosis not present

## 2023-05-08 DIAGNOSIS — N186 End stage renal disease: Secondary | ICD-10-CM | POA: Diagnosis not present

## 2023-05-08 DIAGNOSIS — Z9981 Dependence on supplemental oxygen: Secondary | ICD-10-CM | POA: Diagnosis not present

## 2023-05-08 DIAGNOSIS — R0602 Shortness of breath: Secondary | ICD-10-CM | POA: Diagnosis not present

## 2023-05-08 DIAGNOSIS — J9622 Acute and chronic respiratory failure with hypercapnia: Secondary | ICD-10-CM | POA: Diagnosis not present

## 2023-05-08 DIAGNOSIS — J440 Chronic obstructive pulmonary disease with acute lower respiratory infection: Secondary | ICD-10-CM | POA: Diagnosis not present

## 2023-05-08 DIAGNOSIS — Z931 Gastrostomy status: Secondary | ICD-10-CM | POA: Diagnosis not present

## 2023-05-08 DIAGNOSIS — I509 Heart failure, unspecified: Secondary | ICD-10-CM | POA: Diagnosis not present

## 2023-05-08 DIAGNOSIS — J189 Pneumonia, unspecified organism: Secondary | ICD-10-CM | POA: Diagnosis not present

## 2023-05-08 DIAGNOSIS — R062 Wheezing: Secondary | ICD-10-CM | POA: Diagnosis not present

## 2023-05-08 DIAGNOSIS — E1122 Type 2 diabetes mellitus with diabetic chronic kidney disease: Secondary | ICD-10-CM | POA: Diagnosis not present

## 2023-05-08 DIAGNOSIS — M25561 Pain in right knee: Secondary | ICD-10-CM | POA: Diagnosis not present

## 2023-05-08 DIAGNOSIS — E440 Moderate protein-calorie malnutrition: Secondary | ICD-10-CM | POA: Diagnosis not present

## 2023-05-08 DIAGNOSIS — I5023 Acute on chronic systolic (congestive) heart failure: Secondary | ICD-10-CM | POA: Diagnosis not present

## 2023-05-08 DIAGNOSIS — R1310 Dysphagia, unspecified: Secondary | ICD-10-CM | POA: Diagnosis not present

## 2023-05-08 DIAGNOSIS — Z955 Presence of coronary angioplasty implant and graft: Secondary | ICD-10-CM | POA: Diagnosis not present

## 2023-05-08 DIAGNOSIS — R000 Tachycardia, unspecified: Secondary | ICD-10-CM | POA: Diagnosis not present

## 2023-05-08 DIAGNOSIS — J9690 Respiratory failure, unspecified, unspecified whether with hypoxia or hypercapnia: Secondary | ICD-10-CM | POA: Diagnosis not present

## 2023-05-08 DIAGNOSIS — Z681 Body mass index (BMI) 19 or less, adult: Secondary | ICD-10-CM | POA: Diagnosis not present

## 2023-05-08 DIAGNOSIS — Z992 Dependence on renal dialysis: Secondary | ICD-10-CM | POA: Diagnosis not present

## 2023-05-09 DIAGNOSIS — J189 Pneumonia, unspecified organism: Secondary | ICD-10-CM | POA: Diagnosis not present

## 2023-05-09 DIAGNOSIS — E8770 Fluid overload, unspecified: Secondary | ICD-10-CM | POA: Diagnosis not present

## 2023-05-09 DIAGNOSIS — N186 End stage renal disease: Secondary | ICD-10-CM | POA: Diagnosis not present

## 2023-05-09 DIAGNOSIS — D631 Anemia in chronic kidney disease: Secondary | ICD-10-CM | POA: Diagnosis not present

## 2023-05-09 DIAGNOSIS — J9690 Respiratory failure, unspecified, unspecified whether with hypoxia or hypercapnia: Secondary | ICD-10-CM | POA: Diagnosis not present

## 2023-05-09 DIAGNOSIS — J9622 Acute and chronic respiratory failure with hypercapnia: Secondary | ICD-10-CM | POA: Diagnosis not present

## 2023-05-09 DIAGNOSIS — I509 Heart failure, unspecified: Secondary | ICD-10-CM | POA: Diagnosis not present

## 2023-05-10 DIAGNOSIS — D631 Anemia in chronic kidney disease: Secondary | ICD-10-CM | POA: Diagnosis not present

## 2023-05-10 DIAGNOSIS — J9622 Acute and chronic respiratory failure with hypercapnia: Secondary | ICD-10-CM | POA: Diagnosis not present

## 2023-05-10 DIAGNOSIS — N186 End stage renal disease: Secondary | ICD-10-CM | POA: Diagnosis not present

## 2023-05-10 DIAGNOSIS — M25561 Pain in right knee: Secondary | ICD-10-CM | POA: Diagnosis not present

## 2023-05-10 DIAGNOSIS — J189 Pneumonia, unspecified organism: Secondary | ICD-10-CM | POA: Diagnosis not present

## 2023-05-10 DIAGNOSIS — I509 Heart failure, unspecified: Secondary | ICD-10-CM | POA: Diagnosis not present

## 2023-05-10 DIAGNOSIS — E8770 Fluid overload, unspecified: Secondary | ICD-10-CM | POA: Diagnosis not present

## 2023-05-10 DIAGNOSIS — J9690 Respiratory failure, unspecified, unspecified whether with hypoxia or hypercapnia: Secondary | ICD-10-CM | POA: Diagnosis not present

## 2023-05-11 DIAGNOSIS — E8770 Fluid overload, unspecified: Secondary | ICD-10-CM | POA: Diagnosis not present

## 2023-05-11 DIAGNOSIS — I509 Heart failure, unspecified: Secondary | ICD-10-CM | POA: Diagnosis not present

## 2023-05-11 DIAGNOSIS — J189 Pneumonia, unspecified organism: Secondary | ICD-10-CM | POA: Diagnosis not present

## 2023-05-11 DIAGNOSIS — N186 End stage renal disease: Secondary | ICD-10-CM | POA: Diagnosis not present

## 2023-05-11 DIAGNOSIS — J9622 Acute and chronic respiratory failure with hypercapnia: Secondary | ICD-10-CM | POA: Diagnosis not present

## 2023-05-12 DIAGNOSIS — N186 End stage renal disease: Secondary | ICD-10-CM | POA: Diagnosis not present

## 2023-05-12 DIAGNOSIS — N250 Renal osteodystrophy: Secondary | ICD-10-CM | POA: Diagnosis not present

## 2023-05-12 DIAGNOSIS — Z992 Dependence on renal dialysis: Secondary | ICD-10-CM | POA: Diagnosis not present

## 2023-05-12 DIAGNOSIS — D631 Anemia in chronic kidney disease: Secondary | ICD-10-CM | POA: Diagnosis not present

## 2023-05-12 DIAGNOSIS — D509 Iron deficiency anemia, unspecified: Secondary | ICD-10-CM | POA: Diagnosis not present

## 2023-05-14 DIAGNOSIS — Z86718 Personal history of other venous thrombosis and embolism: Secondary | ICD-10-CM | POA: Diagnosis not present

## 2023-05-14 DIAGNOSIS — Z7951 Long term (current) use of inhaled steroids: Secondary | ICD-10-CM | POA: Diagnosis not present

## 2023-05-14 DIAGNOSIS — F419 Anxiety disorder, unspecified: Secondary | ICD-10-CM | POA: Diagnosis not present

## 2023-05-14 DIAGNOSIS — K219 Gastro-esophageal reflux disease without esophagitis: Secondary | ICD-10-CM | POA: Diagnosis not present

## 2023-05-14 DIAGNOSIS — Z992 Dependence on renal dialysis: Secondary | ICD-10-CM | POA: Diagnosis not present

## 2023-05-14 DIAGNOSIS — I5020 Unspecified systolic (congestive) heart failure: Secondary | ICD-10-CM | POA: Diagnosis not present

## 2023-05-14 DIAGNOSIS — I2510 Atherosclerotic heart disease of native coronary artery without angina pectoris: Secondary | ICD-10-CM | POA: Diagnosis not present

## 2023-05-14 DIAGNOSIS — J9622 Acute and chronic respiratory failure with hypercapnia: Secondary | ICD-10-CM | POA: Diagnosis not present

## 2023-05-14 DIAGNOSIS — J441 Chronic obstructive pulmonary disease with (acute) exacerbation: Secondary | ICD-10-CM | POA: Diagnosis not present

## 2023-05-14 DIAGNOSIS — R1310 Dysphagia, unspecified: Secondary | ICD-10-CM | POA: Diagnosis not present

## 2023-05-14 DIAGNOSIS — Z7984 Long term (current) use of oral hypoglycemic drugs: Secondary | ICD-10-CM | POA: Diagnosis not present

## 2023-05-14 DIAGNOSIS — E1122 Type 2 diabetes mellitus with diabetic chronic kidney disease: Secondary | ICD-10-CM | POA: Diagnosis not present

## 2023-05-14 DIAGNOSIS — J9621 Acute and chronic respiratory failure with hypoxia: Secondary | ICD-10-CM | POA: Diagnosis not present

## 2023-05-14 DIAGNOSIS — I132 Hypertensive heart and chronic kidney disease with heart failure and with stage 5 chronic kidney disease, or end stage renal disease: Secondary | ICD-10-CM | POA: Diagnosis not present

## 2023-05-14 DIAGNOSIS — Z9981 Dependence on supplemental oxygen: Secondary | ICD-10-CM | POA: Diagnosis not present

## 2023-05-14 DIAGNOSIS — N186 End stage renal disease: Secondary | ICD-10-CM | POA: Diagnosis not present

## 2023-05-14 DIAGNOSIS — Z7902 Long term (current) use of antithrombotics/antiplatelets: Secondary | ICD-10-CM | POA: Diagnosis not present

## 2023-05-14 DIAGNOSIS — F32A Depression, unspecified: Secondary | ICD-10-CM | POA: Diagnosis not present

## 2023-05-15 DIAGNOSIS — N250 Renal osteodystrophy: Secondary | ICD-10-CM | POA: Diagnosis not present

## 2023-05-15 DIAGNOSIS — D631 Anemia in chronic kidney disease: Secondary | ICD-10-CM | POA: Diagnosis not present

## 2023-05-15 DIAGNOSIS — N186 End stage renal disease: Secondary | ICD-10-CM | POA: Diagnosis not present

## 2023-05-15 DIAGNOSIS — Z992 Dependence on renal dialysis: Secondary | ICD-10-CM | POA: Diagnosis not present

## 2023-05-15 DIAGNOSIS — D509 Iron deficiency anemia, unspecified: Secondary | ICD-10-CM | POA: Diagnosis not present

## 2023-05-16 ENCOUNTER — Ambulatory Visit: Payer: Medicare Other | Admitting: Cardiology

## 2023-05-16 DIAGNOSIS — J441 Chronic obstructive pulmonary disease with (acute) exacerbation: Secondary | ICD-10-CM | POA: Diagnosis not present

## 2023-05-16 DIAGNOSIS — J9622 Acute and chronic respiratory failure with hypercapnia: Secondary | ICD-10-CM | POA: Diagnosis not present

## 2023-05-16 DIAGNOSIS — J9621 Acute and chronic respiratory failure with hypoxia: Secondary | ICD-10-CM | POA: Diagnosis not present

## 2023-05-16 DIAGNOSIS — I132 Hypertensive heart and chronic kidney disease with heart failure and with stage 5 chronic kidney disease, or end stage renal disease: Secondary | ICD-10-CM | POA: Diagnosis not present

## 2023-05-16 DIAGNOSIS — I5020 Unspecified systolic (congestive) heart failure: Secondary | ICD-10-CM | POA: Diagnosis not present

## 2023-05-16 DIAGNOSIS — E1122 Type 2 diabetes mellitus with diabetic chronic kidney disease: Secondary | ICD-10-CM | POA: Diagnosis not present

## 2023-05-17 DIAGNOSIS — N186 End stage renal disease: Secondary | ICD-10-CM | POA: Diagnosis not present

## 2023-05-17 DIAGNOSIS — D631 Anemia in chronic kidney disease: Secondary | ICD-10-CM | POA: Diagnosis not present

## 2023-05-17 DIAGNOSIS — I5020 Unspecified systolic (congestive) heart failure: Secondary | ICD-10-CM | POA: Diagnosis not present

## 2023-05-17 DIAGNOSIS — J9621 Acute and chronic respiratory failure with hypoxia: Secondary | ICD-10-CM | POA: Diagnosis not present

## 2023-05-17 DIAGNOSIS — J9622 Acute and chronic respiratory failure with hypercapnia: Secondary | ICD-10-CM | POA: Diagnosis not present

## 2023-05-17 DIAGNOSIS — Z992 Dependence on renal dialysis: Secondary | ICD-10-CM | POA: Diagnosis not present

## 2023-05-17 DIAGNOSIS — D509 Iron deficiency anemia, unspecified: Secondary | ICD-10-CM | POA: Diagnosis not present

## 2023-05-17 DIAGNOSIS — I132 Hypertensive heart and chronic kidney disease with heart failure and with stage 5 chronic kidney disease, or end stage renal disease: Secondary | ICD-10-CM | POA: Diagnosis not present

## 2023-05-17 DIAGNOSIS — J441 Chronic obstructive pulmonary disease with (acute) exacerbation: Secondary | ICD-10-CM | POA: Diagnosis not present

## 2023-05-17 DIAGNOSIS — N250 Renal osteodystrophy: Secondary | ICD-10-CM | POA: Diagnosis not present

## 2023-05-17 DIAGNOSIS — E1122 Type 2 diabetes mellitus with diabetic chronic kidney disease: Secondary | ICD-10-CM | POA: Diagnosis not present

## 2023-05-18 DIAGNOSIS — I5022 Chronic systolic (congestive) heart failure: Secondary | ICD-10-CM | POA: Diagnosis not present

## 2023-05-18 DIAGNOSIS — N186 End stage renal disease: Secondary | ICD-10-CM | POA: Diagnosis not present

## 2023-05-18 DIAGNOSIS — Z7189 Other specified counseling: Secondary | ICD-10-CM | POA: Diagnosis not present

## 2023-05-18 DIAGNOSIS — I10 Essential (primary) hypertension: Secondary | ICD-10-CM | POA: Diagnosis not present

## 2023-05-18 DIAGNOSIS — E1122 Type 2 diabetes mellitus with diabetic chronic kidney disease: Secondary | ICD-10-CM | POA: Diagnosis not present

## 2023-05-18 DIAGNOSIS — Z1331 Encounter for screening for depression: Secondary | ICD-10-CM | POA: Diagnosis not present

## 2023-05-18 DIAGNOSIS — I25119 Atherosclerotic heart disease of native coronary artery with unspecified angina pectoris: Secondary | ICD-10-CM | POA: Diagnosis not present

## 2023-05-18 DIAGNOSIS — J441 Chronic obstructive pulmonary disease with (acute) exacerbation: Secondary | ICD-10-CM | POA: Diagnosis not present

## 2023-05-18 DIAGNOSIS — E1165 Type 2 diabetes mellitus with hyperglycemia: Secondary | ICD-10-CM | POA: Diagnosis not present

## 2023-05-18 DIAGNOSIS — I132 Hypertensive heart and chronic kidney disease with heart failure and with stage 5 chronic kidney disease, or end stage renal disease: Secondary | ICD-10-CM | POA: Diagnosis not present

## 2023-05-18 DIAGNOSIS — Z299 Encounter for prophylactic measures, unspecified: Secondary | ICD-10-CM | POA: Diagnosis not present

## 2023-05-18 DIAGNOSIS — J9621 Acute and chronic respiratory failure with hypoxia: Secondary | ICD-10-CM | POA: Diagnosis not present

## 2023-05-18 DIAGNOSIS — Z0000 Encounter for general adult medical examination without abnormal findings: Secondary | ICD-10-CM | POA: Diagnosis not present

## 2023-05-18 DIAGNOSIS — J9622 Acute and chronic respiratory failure with hypercapnia: Secondary | ICD-10-CM | POA: Diagnosis not present

## 2023-05-18 DIAGNOSIS — Z1339 Encounter for screening examination for other mental health and behavioral disorders: Secondary | ICD-10-CM | POA: Diagnosis not present

## 2023-05-18 DIAGNOSIS — I5020 Unspecified systolic (congestive) heart failure: Secondary | ICD-10-CM | POA: Diagnosis not present

## 2023-05-19 DIAGNOSIS — N250 Renal osteodystrophy: Secondary | ICD-10-CM | POA: Diagnosis not present

## 2023-05-19 DIAGNOSIS — N186 End stage renal disease: Secondary | ICD-10-CM | POA: Diagnosis not present

## 2023-05-19 DIAGNOSIS — D631 Anemia in chronic kidney disease: Secondary | ICD-10-CM | POA: Diagnosis not present

## 2023-05-19 DIAGNOSIS — D509 Iron deficiency anemia, unspecified: Secondary | ICD-10-CM | POA: Diagnosis not present

## 2023-05-19 DIAGNOSIS — Z992 Dependence on renal dialysis: Secondary | ICD-10-CM | POA: Diagnosis not present

## 2023-05-22 DIAGNOSIS — N250 Renal osteodystrophy: Secondary | ICD-10-CM | POA: Diagnosis not present

## 2023-05-22 DIAGNOSIS — N186 End stage renal disease: Secondary | ICD-10-CM | POA: Diagnosis not present

## 2023-05-22 DIAGNOSIS — J690 Pneumonitis due to inhalation of food and vomit: Secondary | ICD-10-CM | POA: Diagnosis not present

## 2023-05-22 DIAGNOSIS — D631 Anemia in chronic kidney disease: Secondary | ICD-10-CM | POA: Diagnosis not present

## 2023-05-22 DIAGNOSIS — D509 Iron deficiency anemia, unspecified: Secondary | ICD-10-CM | POA: Diagnosis not present

## 2023-05-22 DIAGNOSIS — J15212 Pneumonia due to Methicillin resistant Staphylococcus aureus: Secondary | ICD-10-CM | POA: Diagnosis not present

## 2023-05-22 DIAGNOSIS — J9601 Acute respiratory failure with hypoxia: Secondary | ICD-10-CM | POA: Diagnosis not present

## 2023-05-22 DIAGNOSIS — R1312 Dysphagia, oropharyngeal phase: Secondary | ICD-10-CM | POA: Diagnosis not present

## 2023-05-22 DIAGNOSIS — Z992 Dependence on renal dialysis: Secondary | ICD-10-CM | POA: Diagnosis not present

## 2023-05-23 DIAGNOSIS — J9621 Acute and chronic respiratory failure with hypoxia: Secondary | ICD-10-CM | POA: Diagnosis not present

## 2023-05-23 DIAGNOSIS — J9622 Acute and chronic respiratory failure with hypercapnia: Secondary | ICD-10-CM | POA: Diagnosis not present

## 2023-05-23 DIAGNOSIS — E1122 Type 2 diabetes mellitus with diabetic chronic kidney disease: Secondary | ICD-10-CM | POA: Diagnosis not present

## 2023-05-23 DIAGNOSIS — J441 Chronic obstructive pulmonary disease with (acute) exacerbation: Secondary | ICD-10-CM | POA: Diagnosis not present

## 2023-05-23 DIAGNOSIS — I132 Hypertensive heart and chronic kidney disease with heart failure and with stage 5 chronic kidney disease, or end stage renal disease: Secondary | ICD-10-CM | POA: Diagnosis not present

## 2023-05-23 DIAGNOSIS — I5020 Unspecified systolic (congestive) heart failure: Secondary | ICD-10-CM | POA: Diagnosis not present

## 2023-05-24 DIAGNOSIS — N186 End stage renal disease: Secondary | ICD-10-CM | POA: Diagnosis not present

## 2023-05-24 DIAGNOSIS — D509 Iron deficiency anemia, unspecified: Secondary | ICD-10-CM | POA: Diagnosis not present

## 2023-05-24 DIAGNOSIS — N250 Renal osteodystrophy: Secondary | ICD-10-CM | POA: Diagnosis not present

## 2023-05-24 DIAGNOSIS — D631 Anemia in chronic kidney disease: Secondary | ICD-10-CM | POA: Diagnosis not present

## 2023-05-24 DIAGNOSIS — Z992 Dependence on renal dialysis: Secondary | ICD-10-CM | POA: Diagnosis not present

## 2023-05-25 DIAGNOSIS — J9621 Acute and chronic respiratory failure with hypoxia: Secondary | ICD-10-CM | POA: Diagnosis not present

## 2023-05-25 DIAGNOSIS — E1122 Type 2 diabetes mellitus with diabetic chronic kidney disease: Secondary | ICD-10-CM | POA: Diagnosis not present

## 2023-05-25 DIAGNOSIS — J441 Chronic obstructive pulmonary disease with (acute) exacerbation: Secondary | ICD-10-CM | POA: Diagnosis not present

## 2023-05-25 DIAGNOSIS — J9622 Acute and chronic respiratory failure with hypercapnia: Secondary | ICD-10-CM | POA: Diagnosis not present

## 2023-05-25 DIAGNOSIS — I132 Hypertensive heart and chronic kidney disease with heart failure and with stage 5 chronic kidney disease, or end stage renal disease: Secondary | ICD-10-CM | POA: Diagnosis not present

## 2023-05-25 DIAGNOSIS — I5020 Unspecified systolic (congestive) heart failure: Secondary | ICD-10-CM | POA: Diagnosis not present

## 2023-05-26 DIAGNOSIS — Z992 Dependence on renal dialysis: Secondary | ICD-10-CM | POA: Diagnosis not present

## 2023-05-26 DIAGNOSIS — D631 Anemia in chronic kidney disease: Secondary | ICD-10-CM | POA: Diagnosis not present

## 2023-05-26 DIAGNOSIS — N186 End stage renal disease: Secondary | ICD-10-CM | POA: Diagnosis not present

## 2023-05-26 DIAGNOSIS — N250 Renal osteodystrophy: Secondary | ICD-10-CM | POA: Diagnosis not present

## 2023-05-26 DIAGNOSIS — D509 Iron deficiency anemia, unspecified: Secondary | ICD-10-CM | POA: Diagnosis not present

## 2023-05-27 DIAGNOSIS — Z7902 Long term (current) use of antithrombotics/antiplatelets: Secondary | ICD-10-CM | POA: Diagnosis not present

## 2023-05-27 DIAGNOSIS — I959 Hypotension, unspecified: Secondary | ICD-10-CM | POA: Diagnosis not present

## 2023-05-27 DIAGNOSIS — R110 Nausea: Secondary | ICD-10-CM | POA: Diagnosis not present

## 2023-05-27 DIAGNOSIS — Z955 Presence of coronary angioplasty implant and graft: Secondary | ICD-10-CM | POA: Diagnosis not present

## 2023-05-27 DIAGNOSIS — R6520 Severe sepsis without septic shock: Secondary | ICD-10-CM | POA: Diagnosis not present

## 2023-05-27 DIAGNOSIS — I2510 Atherosclerotic heart disease of native coronary artery without angina pectoris: Secondary | ICD-10-CM | POA: Diagnosis not present

## 2023-05-27 DIAGNOSIS — J440 Chronic obstructive pulmonary disease with acute lower respiratory infection: Secondary | ICD-10-CM | POA: Diagnosis not present

## 2023-05-27 DIAGNOSIS — Z79899 Other long term (current) drug therapy: Secondary | ICD-10-CM | POA: Diagnosis not present

## 2023-05-27 DIAGNOSIS — K219 Gastro-esophageal reflux disease without esophagitis: Secondary | ICD-10-CM | POA: Diagnosis not present

## 2023-05-27 DIAGNOSIS — D631 Anemia in chronic kidney disease: Secondary | ICD-10-CM | POA: Diagnosis not present

## 2023-05-27 DIAGNOSIS — E871 Hypo-osmolality and hyponatremia: Secondary | ICD-10-CM | POA: Diagnosis not present

## 2023-05-27 DIAGNOSIS — Z880 Allergy status to penicillin: Secondary | ICD-10-CM | POA: Diagnosis not present

## 2023-05-27 DIAGNOSIS — R531 Weakness: Secondary | ICD-10-CM | POA: Diagnosis not present

## 2023-05-27 DIAGNOSIS — E1122 Type 2 diabetes mellitus with diabetic chronic kidney disease: Secondary | ICD-10-CM | POA: Diagnosis not present

## 2023-05-27 DIAGNOSIS — J189 Pneumonia, unspecified organism: Secondary | ICD-10-CM | POA: Diagnosis not present

## 2023-05-27 DIAGNOSIS — F419 Anxiety disorder, unspecified: Secondary | ICD-10-CM | POA: Diagnosis not present

## 2023-05-27 DIAGNOSIS — J168 Pneumonia due to other specified infectious organisms: Secondary | ICD-10-CM | POA: Diagnosis not present

## 2023-05-27 DIAGNOSIS — Z7982 Long term (current) use of aspirin: Secondary | ICD-10-CM | POA: Diagnosis not present

## 2023-05-27 DIAGNOSIS — I132 Hypertensive heart and chronic kidney disease with heart failure and with stage 5 chronic kidney disease, or end stage renal disease: Secondary | ICD-10-CM | POA: Diagnosis not present

## 2023-05-27 DIAGNOSIS — J9611 Chronic respiratory failure with hypoxia: Secondary | ICD-10-CM | POA: Diagnosis not present

## 2023-05-27 DIAGNOSIS — I5022 Chronic systolic (congestive) heart failure: Secondary | ICD-10-CM | POA: Diagnosis not present

## 2023-05-27 DIAGNOSIS — N186 End stage renal disease: Secondary | ICD-10-CM | POA: Diagnosis not present

## 2023-05-27 DIAGNOSIS — J9622 Acute and chronic respiratory failure with hypercapnia: Secondary | ICD-10-CM | POA: Diagnosis not present

## 2023-05-27 DIAGNOSIS — I509 Heart failure, unspecified: Secondary | ICD-10-CM | POA: Diagnosis not present

## 2023-05-27 DIAGNOSIS — Z9981 Dependence on supplemental oxygen: Secondary | ICD-10-CM | POA: Diagnosis not present

## 2023-05-27 DIAGNOSIS — R000 Tachycardia, unspecified: Secondary | ICD-10-CM | POA: Diagnosis not present

## 2023-05-27 DIAGNOSIS — Z992 Dependence on renal dialysis: Secondary | ICD-10-CM | POA: Diagnosis not present

## 2023-05-27 DIAGNOSIS — J9612 Chronic respiratory failure with hypercapnia: Secondary | ICD-10-CM | POA: Diagnosis not present

## 2023-05-27 DIAGNOSIS — Z931 Gastrostomy status: Secondary | ICD-10-CM | POA: Diagnosis not present

## 2023-05-27 DIAGNOSIS — R079 Chest pain, unspecified: Secondary | ICD-10-CM | POA: Diagnosis not present

## 2023-05-27 DIAGNOSIS — D649 Anemia, unspecified: Secondary | ICD-10-CM | POA: Diagnosis not present

## 2023-05-27 DIAGNOSIS — F32A Depression, unspecified: Secondary | ICD-10-CM | POA: Diagnosis not present

## 2023-05-27 DIAGNOSIS — Z7984 Long term (current) use of oral hypoglycemic drugs: Secondary | ICD-10-CM | POA: Diagnosis not present

## 2023-05-27 DIAGNOSIS — A419 Sepsis, unspecified organism: Secondary | ICD-10-CM | POA: Diagnosis not present

## 2023-05-27 DIAGNOSIS — J9621 Acute and chronic respiratory failure with hypoxia: Secondary | ICD-10-CM | POA: Diagnosis not present

## 2023-05-30 DIAGNOSIS — J441 Chronic obstructive pulmonary disease with (acute) exacerbation: Secondary | ICD-10-CM | POA: Diagnosis not present

## 2023-05-30 DIAGNOSIS — I132 Hypertensive heart and chronic kidney disease with heart failure and with stage 5 chronic kidney disease, or end stage renal disease: Secondary | ICD-10-CM | POA: Diagnosis not present

## 2023-05-30 DIAGNOSIS — J9621 Acute and chronic respiratory failure with hypoxia: Secondary | ICD-10-CM | POA: Diagnosis not present

## 2023-05-30 DIAGNOSIS — E1122 Type 2 diabetes mellitus with diabetic chronic kidney disease: Secondary | ICD-10-CM | POA: Diagnosis not present

## 2023-05-30 DIAGNOSIS — I5020 Unspecified systolic (congestive) heart failure: Secondary | ICD-10-CM | POA: Diagnosis not present

## 2023-05-30 DIAGNOSIS — J9622 Acute and chronic respiratory failure with hypercapnia: Secondary | ICD-10-CM | POA: Diagnosis not present

## 2023-05-30 DIAGNOSIS — Z992 Dependence on renal dialysis: Secondary | ICD-10-CM | POA: Diagnosis not present

## 2023-05-30 DIAGNOSIS — N186 End stage renal disease: Secondary | ICD-10-CM | POA: Diagnosis not present

## 2023-05-31 DIAGNOSIS — N250 Renal osteodystrophy: Secondary | ICD-10-CM | POA: Diagnosis not present

## 2023-05-31 DIAGNOSIS — D631 Anemia in chronic kidney disease: Secondary | ICD-10-CM | POA: Diagnosis not present

## 2023-05-31 DIAGNOSIS — Z992 Dependence on renal dialysis: Secondary | ICD-10-CM | POA: Diagnosis not present

## 2023-05-31 DIAGNOSIS — D509 Iron deficiency anemia, unspecified: Secondary | ICD-10-CM | POA: Diagnosis not present

## 2023-05-31 DIAGNOSIS — N186 End stage renal disease: Secondary | ICD-10-CM | POA: Diagnosis not present

## 2023-06-02 DIAGNOSIS — N250 Renal osteodystrophy: Secondary | ICD-10-CM | POA: Diagnosis not present

## 2023-06-02 DIAGNOSIS — D631 Anemia in chronic kidney disease: Secondary | ICD-10-CM | POA: Diagnosis not present

## 2023-06-02 DIAGNOSIS — D509 Iron deficiency anemia, unspecified: Secondary | ICD-10-CM | POA: Diagnosis not present

## 2023-06-02 DIAGNOSIS — N186 End stage renal disease: Secondary | ICD-10-CM | POA: Diagnosis not present

## 2023-06-02 DIAGNOSIS — Z992 Dependence on renal dialysis: Secondary | ICD-10-CM | POA: Diagnosis not present

## 2023-06-04 DIAGNOSIS — I2510 Atherosclerotic heart disease of native coronary artery without angina pectoris: Secondary | ICD-10-CM | POA: Diagnosis not present

## 2023-06-04 DIAGNOSIS — J9622 Acute and chronic respiratory failure with hypercapnia: Secondary | ICD-10-CM | POA: Diagnosis not present

## 2023-06-04 DIAGNOSIS — J9621 Acute and chronic respiratory failure with hypoxia: Secondary | ICD-10-CM | POA: Diagnosis not present

## 2023-06-04 DIAGNOSIS — J441 Chronic obstructive pulmonary disease with (acute) exacerbation: Secondary | ICD-10-CM | POA: Diagnosis not present

## 2023-06-04 DIAGNOSIS — I5020 Unspecified systolic (congestive) heart failure: Secondary | ICD-10-CM | POA: Diagnosis not present

## 2023-06-04 DIAGNOSIS — E1122 Type 2 diabetes mellitus with diabetic chronic kidney disease: Secondary | ICD-10-CM | POA: Diagnosis not present

## 2023-06-04 DIAGNOSIS — I509 Heart failure, unspecified: Secondary | ICD-10-CM | POA: Diagnosis not present

## 2023-06-04 DIAGNOSIS — Z931 Gastrostomy status: Secondary | ICD-10-CM | POA: Diagnosis not present

## 2023-06-04 DIAGNOSIS — I10 Essential (primary) hypertension: Secondary | ICD-10-CM | POA: Diagnosis not present

## 2023-06-04 DIAGNOSIS — I132 Hypertensive heart and chronic kidney disease with heart failure and with stage 5 chronic kidney disease, or end stage renal disease: Secondary | ICD-10-CM | POA: Diagnosis not present

## 2023-06-05 DIAGNOSIS — N250 Renal osteodystrophy: Secondary | ICD-10-CM | POA: Diagnosis not present

## 2023-06-05 DIAGNOSIS — N186 End stage renal disease: Secondary | ICD-10-CM | POA: Diagnosis not present

## 2023-06-05 DIAGNOSIS — D509 Iron deficiency anemia, unspecified: Secondary | ICD-10-CM | POA: Diagnosis not present

## 2023-06-05 DIAGNOSIS — Z992 Dependence on renal dialysis: Secondary | ICD-10-CM | POA: Diagnosis not present

## 2023-06-05 DIAGNOSIS — D631 Anemia in chronic kidney disease: Secondary | ICD-10-CM | POA: Diagnosis not present

## 2023-06-06 DIAGNOSIS — R0602 Shortness of breath: Secondary | ICD-10-CM | POA: Diagnosis not present

## 2023-06-06 DIAGNOSIS — I10 Essential (primary) hypertension: Secondary | ICD-10-CM | POA: Diagnosis not present

## 2023-06-06 DIAGNOSIS — J189 Pneumonia, unspecified organism: Secondary | ICD-10-CM | POA: Diagnosis not present

## 2023-06-06 DIAGNOSIS — Z299 Encounter for prophylactic measures, unspecified: Secondary | ICD-10-CM | POA: Diagnosis not present

## 2023-06-07 DIAGNOSIS — D509 Iron deficiency anemia, unspecified: Secondary | ICD-10-CM | POA: Diagnosis not present

## 2023-06-07 DIAGNOSIS — N186 End stage renal disease: Secondary | ICD-10-CM | POA: Diagnosis not present

## 2023-06-07 DIAGNOSIS — N250 Renal osteodystrophy: Secondary | ICD-10-CM | POA: Diagnosis not present

## 2023-06-07 DIAGNOSIS — Z992 Dependence on renal dialysis: Secondary | ICD-10-CM | POA: Diagnosis not present

## 2023-06-07 DIAGNOSIS — D631 Anemia in chronic kidney disease: Secondary | ICD-10-CM | POA: Diagnosis not present

## 2023-06-08 DIAGNOSIS — I5020 Unspecified systolic (congestive) heart failure: Secondary | ICD-10-CM | POA: Diagnosis not present

## 2023-06-08 DIAGNOSIS — J9621 Acute and chronic respiratory failure with hypoxia: Secondary | ICD-10-CM | POA: Diagnosis not present

## 2023-06-08 DIAGNOSIS — J9622 Acute and chronic respiratory failure with hypercapnia: Secondary | ICD-10-CM | POA: Diagnosis not present

## 2023-06-08 DIAGNOSIS — I132 Hypertensive heart and chronic kidney disease with heart failure and with stage 5 chronic kidney disease, or end stage renal disease: Secondary | ICD-10-CM | POA: Diagnosis not present

## 2023-06-08 DIAGNOSIS — J441 Chronic obstructive pulmonary disease with (acute) exacerbation: Secondary | ICD-10-CM | POA: Diagnosis not present

## 2023-06-08 DIAGNOSIS — E1122 Type 2 diabetes mellitus with diabetic chronic kidney disease: Secondary | ICD-10-CM | POA: Diagnosis not present

## 2023-06-09 DIAGNOSIS — D509 Iron deficiency anemia, unspecified: Secondary | ICD-10-CM | POA: Diagnosis not present

## 2023-06-09 DIAGNOSIS — N250 Renal osteodystrophy: Secondary | ICD-10-CM | POA: Diagnosis not present

## 2023-06-09 DIAGNOSIS — D631 Anemia in chronic kidney disease: Secondary | ICD-10-CM | POA: Diagnosis not present

## 2023-06-09 DIAGNOSIS — N186 End stage renal disease: Secondary | ICD-10-CM | POA: Diagnosis not present

## 2023-06-09 DIAGNOSIS — Z992 Dependence on renal dialysis: Secondary | ICD-10-CM | POA: Diagnosis not present

## 2023-06-10 DIAGNOSIS — N186 End stage renal disease: Secondary | ICD-10-CM | POA: Diagnosis not present

## 2023-06-11 DIAGNOSIS — J441 Chronic obstructive pulmonary disease with (acute) exacerbation: Secondary | ICD-10-CM | POA: Diagnosis not present

## 2023-06-11 DIAGNOSIS — J9622 Acute and chronic respiratory failure with hypercapnia: Secondary | ICD-10-CM | POA: Diagnosis not present

## 2023-06-11 DIAGNOSIS — E1122 Type 2 diabetes mellitus with diabetic chronic kidney disease: Secondary | ICD-10-CM | POA: Diagnosis not present

## 2023-06-11 DIAGNOSIS — J9621 Acute and chronic respiratory failure with hypoxia: Secondary | ICD-10-CM | POA: Diagnosis not present

## 2023-06-11 DIAGNOSIS — I132 Hypertensive heart and chronic kidney disease with heart failure and with stage 5 chronic kidney disease, or end stage renal disease: Secondary | ICD-10-CM | POA: Diagnosis not present

## 2023-06-11 DIAGNOSIS — I5020 Unspecified systolic (congestive) heart failure: Secondary | ICD-10-CM | POA: Diagnosis not present

## 2023-06-12 DIAGNOSIS — Z992 Dependence on renal dialysis: Secondary | ICD-10-CM | POA: Diagnosis not present

## 2023-06-12 DIAGNOSIS — N250 Renal osteodystrophy: Secondary | ICD-10-CM | POA: Diagnosis not present

## 2023-06-12 DIAGNOSIS — D631 Anemia in chronic kidney disease: Secondary | ICD-10-CM | POA: Diagnosis not present

## 2023-06-12 DIAGNOSIS — N186 End stage renal disease: Secondary | ICD-10-CM | POA: Diagnosis not present

## 2023-06-12 DIAGNOSIS — D509 Iron deficiency anemia, unspecified: Secondary | ICD-10-CM | POA: Diagnosis not present

## 2023-06-13 DIAGNOSIS — I132 Hypertensive heart and chronic kidney disease with heart failure and with stage 5 chronic kidney disease, or end stage renal disease: Secondary | ICD-10-CM | POA: Diagnosis not present

## 2023-06-13 DIAGNOSIS — F32A Depression, unspecified: Secondary | ICD-10-CM | POA: Diagnosis not present

## 2023-06-13 DIAGNOSIS — E119 Type 2 diabetes mellitus without complications: Secondary | ICD-10-CM | POA: Diagnosis not present

## 2023-06-13 DIAGNOSIS — Z86718 Personal history of other venous thrombosis and embolism: Secondary | ICD-10-CM | POA: Diagnosis not present

## 2023-06-13 DIAGNOSIS — D631 Anemia in chronic kidney disease: Secondary | ICD-10-CM | POA: Diagnosis not present

## 2023-06-13 DIAGNOSIS — N250 Renal osteodystrophy: Secondary | ICD-10-CM | POA: Diagnosis not present

## 2023-06-13 DIAGNOSIS — J441 Chronic obstructive pulmonary disease with (acute) exacerbation: Secondary | ICD-10-CM | POA: Diagnosis not present

## 2023-06-13 DIAGNOSIS — E1122 Type 2 diabetes mellitus with diabetic chronic kidney disease: Secondary | ICD-10-CM | POA: Diagnosis not present

## 2023-06-13 DIAGNOSIS — I2510 Atherosclerotic heart disease of native coronary artery without angina pectoris: Secondary | ICD-10-CM | POA: Diagnosis not present

## 2023-06-13 DIAGNOSIS — Z9981 Dependence on supplemental oxygen: Secondary | ICD-10-CM | POA: Diagnosis not present

## 2023-06-13 DIAGNOSIS — N186 End stage renal disease: Secondary | ICD-10-CM | POA: Diagnosis not present

## 2023-06-13 DIAGNOSIS — Z992 Dependence on renal dialysis: Secondary | ICD-10-CM | POA: Diagnosis not present

## 2023-06-13 DIAGNOSIS — Z01810 Encounter for preprocedural cardiovascular examination: Secondary | ICD-10-CM | POA: Diagnosis not present

## 2023-06-13 DIAGNOSIS — J9621 Acute and chronic respiratory failure with hypoxia: Secondary | ICD-10-CM | POA: Diagnosis not present

## 2023-06-13 DIAGNOSIS — R1310 Dysphagia, unspecified: Secondary | ICD-10-CM | POA: Diagnosis not present

## 2023-06-13 DIAGNOSIS — I120 Hypertensive chronic kidney disease with stage 5 chronic kidney disease or end stage renal disease: Secondary | ICD-10-CM | POA: Diagnosis not present

## 2023-06-13 DIAGNOSIS — D509 Iron deficiency anemia, unspecified: Secondary | ICD-10-CM | POA: Diagnosis not present

## 2023-06-13 DIAGNOSIS — I5020 Unspecified systolic (congestive) heart failure: Secondary | ICD-10-CM | POA: Diagnosis not present

## 2023-06-13 DIAGNOSIS — K219 Gastro-esophageal reflux disease without esophagitis: Secondary | ICD-10-CM | POA: Diagnosis not present

## 2023-06-13 DIAGNOSIS — J9622 Acute and chronic respiratory failure with hypercapnia: Secondary | ICD-10-CM | POA: Diagnosis not present

## 2023-06-13 DIAGNOSIS — A419 Sepsis, unspecified organism: Secondary | ICD-10-CM | POA: Diagnosis not present

## 2023-06-13 DIAGNOSIS — Z7951 Long term (current) use of inhaled steroids: Secondary | ICD-10-CM | POA: Diagnosis not present

## 2023-06-13 DIAGNOSIS — Z7984 Long term (current) use of oral hypoglycemic drugs: Secondary | ICD-10-CM | POA: Diagnosis not present

## 2023-06-13 DIAGNOSIS — J189 Pneumonia, unspecified organism: Secondary | ICD-10-CM | POA: Diagnosis not present

## 2023-06-13 DIAGNOSIS — F419 Anxiety disorder, unspecified: Secondary | ICD-10-CM | POA: Diagnosis not present

## 2023-06-13 DIAGNOSIS — Z7902 Long term (current) use of antithrombotics/antiplatelets: Secondary | ICD-10-CM | POA: Diagnosis not present

## 2023-06-14 DIAGNOSIS — I120 Hypertensive chronic kidney disease with stage 5 chronic kidney disease or end stage renal disease: Secondary | ICD-10-CM | POA: Diagnosis not present

## 2023-06-14 DIAGNOSIS — I2510 Atherosclerotic heart disease of native coronary artery without angina pectoris: Secondary | ICD-10-CM | POA: Diagnosis not present

## 2023-06-14 DIAGNOSIS — Z992 Dependence on renal dialysis: Secondary | ICD-10-CM | POA: Diagnosis not present

## 2023-06-14 DIAGNOSIS — J45909 Unspecified asthma, uncomplicated: Secondary | ICD-10-CM | POA: Diagnosis not present

## 2023-06-14 DIAGNOSIS — N186 End stage renal disease: Secondary | ICD-10-CM | POA: Diagnosis not present

## 2023-06-14 DIAGNOSIS — E119 Type 2 diabetes mellitus without complications: Secondary | ICD-10-CM | POA: Diagnosis not present

## 2023-06-14 DIAGNOSIS — J449 Chronic obstructive pulmonary disease, unspecified: Secondary | ICD-10-CM | POA: Diagnosis not present

## 2023-06-15 DIAGNOSIS — J441 Chronic obstructive pulmonary disease with (acute) exacerbation: Secondary | ICD-10-CM | POA: Diagnosis not present

## 2023-06-15 DIAGNOSIS — J9621 Acute and chronic respiratory failure with hypoxia: Secondary | ICD-10-CM | POA: Diagnosis not present

## 2023-06-15 DIAGNOSIS — I132 Hypertensive heart and chronic kidney disease with heart failure and with stage 5 chronic kidney disease, or end stage renal disease: Secondary | ICD-10-CM | POA: Diagnosis not present

## 2023-06-15 DIAGNOSIS — A419 Sepsis, unspecified organism: Secondary | ICD-10-CM | POA: Diagnosis not present

## 2023-06-15 DIAGNOSIS — J189 Pneumonia, unspecified organism: Secondary | ICD-10-CM | POA: Diagnosis not present

## 2023-06-15 DIAGNOSIS — J9622 Acute and chronic respiratory failure with hypercapnia: Secondary | ICD-10-CM | POA: Diagnosis not present

## 2023-06-16 DIAGNOSIS — N250 Renal osteodystrophy: Secondary | ICD-10-CM | POA: Diagnosis not present

## 2023-06-16 DIAGNOSIS — N186 End stage renal disease: Secondary | ICD-10-CM | POA: Diagnosis not present

## 2023-06-16 DIAGNOSIS — D509 Iron deficiency anemia, unspecified: Secondary | ICD-10-CM | POA: Diagnosis not present

## 2023-06-16 DIAGNOSIS — D631 Anemia in chronic kidney disease: Secondary | ICD-10-CM | POA: Diagnosis not present

## 2023-06-16 DIAGNOSIS — Z992 Dependence on renal dialysis: Secondary | ICD-10-CM | POA: Diagnosis not present

## 2023-06-18 DIAGNOSIS — A419 Sepsis, unspecified organism: Secondary | ICD-10-CM | POA: Diagnosis not present

## 2023-06-18 DIAGNOSIS — J189 Pneumonia, unspecified organism: Secondary | ICD-10-CM | POA: Diagnosis not present

## 2023-06-18 DIAGNOSIS — J441 Chronic obstructive pulmonary disease with (acute) exacerbation: Secondary | ICD-10-CM | POA: Diagnosis not present

## 2023-06-18 DIAGNOSIS — J9621 Acute and chronic respiratory failure with hypoxia: Secondary | ICD-10-CM | POA: Diagnosis not present

## 2023-06-18 DIAGNOSIS — J9622 Acute and chronic respiratory failure with hypercapnia: Secondary | ICD-10-CM | POA: Diagnosis not present

## 2023-06-18 DIAGNOSIS — I132 Hypertensive heart and chronic kidney disease with heart failure and with stage 5 chronic kidney disease, or end stage renal disease: Secondary | ICD-10-CM | POA: Diagnosis not present

## 2023-06-19 DIAGNOSIS — D631 Anemia in chronic kidney disease: Secondary | ICD-10-CM | POA: Diagnosis not present

## 2023-06-19 DIAGNOSIS — Z992 Dependence on renal dialysis: Secondary | ICD-10-CM | POA: Diagnosis not present

## 2023-06-19 DIAGNOSIS — N250 Renal osteodystrophy: Secondary | ICD-10-CM | POA: Diagnosis not present

## 2023-06-19 DIAGNOSIS — N186 End stage renal disease: Secondary | ICD-10-CM | POA: Diagnosis not present

## 2023-06-19 DIAGNOSIS — D509 Iron deficiency anemia, unspecified: Secondary | ICD-10-CM | POA: Diagnosis not present

## 2023-06-20 DIAGNOSIS — Z992 Dependence on renal dialysis: Secondary | ICD-10-CM | POA: Diagnosis not present

## 2023-06-20 DIAGNOSIS — D631 Anemia in chronic kidney disease: Secondary | ICD-10-CM | POA: Diagnosis not present

## 2023-06-20 DIAGNOSIS — N186 End stage renal disease: Secondary | ICD-10-CM | POA: Diagnosis not present

## 2023-06-20 DIAGNOSIS — J189 Pneumonia, unspecified organism: Secondary | ICD-10-CM | POA: Diagnosis not present

## 2023-06-20 DIAGNOSIS — I132 Hypertensive heart and chronic kidney disease with heart failure and with stage 5 chronic kidney disease, or end stage renal disease: Secondary | ICD-10-CM | POA: Diagnosis not present

## 2023-06-20 DIAGNOSIS — J441 Chronic obstructive pulmonary disease with (acute) exacerbation: Secondary | ICD-10-CM | POA: Diagnosis not present

## 2023-06-20 DIAGNOSIS — J9621 Acute and chronic respiratory failure with hypoxia: Secondary | ICD-10-CM | POA: Diagnosis not present

## 2023-06-20 DIAGNOSIS — J9622 Acute and chronic respiratory failure with hypercapnia: Secondary | ICD-10-CM | POA: Diagnosis not present

## 2023-06-20 DIAGNOSIS — E8779 Other fluid overload: Secondary | ICD-10-CM | POA: Diagnosis not present

## 2023-06-20 DIAGNOSIS — A419 Sepsis, unspecified organism: Secondary | ICD-10-CM | POA: Diagnosis not present

## 2023-06-21 DIAGNOSIS — Z992 Dependence on renal dialysis: Secondary | ICD-10-CM | POA: Diagnosis not present

## 2023-06-21 DIAGNOSIS — D509 Iron deficiency anemia, unspecified: Secondary | ICD-10-CM | POA: Diagnosis not present

## 2023-06-21 DIAGNOSIS — D631 Anemia in chronic kidney disease: Secondary | ICD-10-CM | POA: Diagnosis not present

## 2023-06-21 DIAGNOSIS — N250 Renal osteodystrophy: Secondary | ICD-10-CM | POA: Diagnosis not present

## 2023-06-21 DIAGNOSIS — N186 End stage renal disease: Secondary | ICD-10-CM | POA: Diagnosis not present

## 2023-06-22 DIAGNOSIS — I132 Hypertensive heart and chronic kidney disease with heart failure and with stage 5 chronic kidney disease, or end stage renal disease: Secondary | ICD-10-CM | POA: Diagnosis not present

## 2023-06-22 DIAGNOSIS — J441 Chronic obstructive pulmonary disease with (acute) exacerbation: Secondary | ICD-10-CM | POA: Diagnosis not present

## 2023-06-22 DIAGNOSIS — J9622 Acute and chronic respiratory failure with hypercapnia: Secondary | ICD-10-CM | POA: Diagnosis not present

## 2023-06-22 DIAGNOSIS — J189 Pneumonia, unspecified organism: Secondary | ICD-10-CM | POA: Diagnosis not present

## 2023-06-22 DIAGNOSIS — A419 Sepsis, unspecified organism: Secondary | ICD-10-CM | POA: Diagnosis not present

## 2023-06-22 DIAGNOSIS — J9621 Acute and chronic respiratory failure with hypoxia: Secondary | ICD-10-CM | POA: Diagnosis not present

## 2023-06-23 DIAGNOSIS — N186 End stage renal disease: Secondary | ICD-10-CM | POA: Diagnosis not present

## 2023-06-23 DIAGNOSIS — D509 Iron deficiency anemia, unspecified: Secondary | ICD-10-CM | POA: Diagnosis not present

## 2023-06-23 DIAGNOSIS — D631 Anemia in chronic kidney disease: Secondary | ICD-10-CM | POA: Diagnosis not present

## 2023-06-23 DIAGNOSIS — Z992 Dependence on renal dialysis: Secondary | ICD-10-CM | POA: Diagnosis not present

## 2023-06-23 DIAGNOSIS — N250 Renal osteodystrophy: Secondary | ICD-10-CM | POA: Diagnosis not present

## 2023-06-25 DIAGNOSIS — I132 Hypertensive heart and chronic kidney disease with heart failure and with stage 5 chronic kidney disease, or end stage renal disease: Secondary | ICD-10-CM | POA: Diagnosis not present

## 2023-06-25 DIAGNOSIS — J189 Pneumonia, unspecified organism: Secondary | ICD-10-CM | POA: Diagnosis not present

## 2023-06-25 DIAGNOSIS — J441 Chronic obstructive pulmonary disease with (acute) exacerbation: Secondary | ICD-10-CM | POA: Diagnosis not present

## 2023-06-25 DIAGNOSIS — A419 Sepsis, unspecified organism: Secondary | ICD-10-CM | POA: Diagnosis not present

## 2023-06-25 DIAGNOSIS — J9622 Acute and chronic respiratory failure with hypercapnia: Secondary | ICD-10-CM | POA: Diagnosis not present

## 2023-06-25 DIAGNOSIS — J9621 Acute and chronic respiratory failure with hypoxia: Secondary | ICD-10-CM | POA: Diagnosis not present

## 2023-06-25 DIAGNOSIS — F329 Major depressive disorder, single episode, unspecified: Secondary | ICD-10-CM | POA: Diagnosis not present

## 2023-06-26 DIAGNOSIS — N186 End stage renal disease: Secondary | ICD-10-CM | POA: Diagnosis not present

## 2023-06-26 DIAGNOSIS — Z992 Dependence on renal dialysis: Secondary | ICD-10-CM | POA: Diagnosis not present

## 2023-06-26 DIAGNOSIS — N250 Renal osteodystrophy: Secondary | ICD-10-CM | POA: Diagnosis not present

## 2023-06-26 DIAGNOSIS — D631 Anemia in chronic kidney disease: Secondary | ICD-10-CM | POA: Diagnosis not present

## 2023-06-26 DIAGNOSIS — D509 Iron deficiency anemia, unspecified: Secondary | ICD-10-CM | POA: Diagnosis not present

## 2023-06-27 DIAGNOSIS — J189 Pneumonia, unspecified organism: Secondary | ICD-10-CM | POA: Diagnosis not present

## 2023-06-27 DIAGNOSIS — A419 Sepsis, unspecified organism: Secondary | ICD-10-CM | POA: Diagnosis not present

## 2023-06-27 DIAGNOSIS — J441 Chronic obstructive pulmonary disease with (acute) exacerbation: Secondary | ICD-10-CM | POA: Diagnosis not present

## 2023-06-27 DIAGNOSIS — J9621 Acute and chronic respiratory failure with hypoxia: Secondary | ICD-10-CM | POA: Diagnosis not present

## 2023-06-27 DIAGNOSIS — J9622 Acute and chronic respiratory failure with hypercapnia: Secondary | ICD-10-CM | POA: Diagnosis not present

## 2023-06-27 DIAGNOSIS — I132 Hypertensive heart and chronic kidney disease with heart failure and with stage 5 chronic kidney disease, or end stage renal disease: Secondary | ICD-10-CM | POA: Diagnosis not present

## 2023-06-28 DIAGNOSIS — N186 End stage renal disease: Secondary | ICD-10-CM | POA: Diagnosis not present

## 2023-06-28 DIAGNOSIS — D509 Iron deficiency anemia, unspecified: Secondary | ICD-10-CM | POA: Diagnosis not present

## 2023-06-28 DIAGNOSIS — N250 Renal osteodystrophy: Secondary | ICD-10-CM | POA: Diagnosis not present

## 2023-06-28 DIAGNOSIS — D631 Anemia in chronic kidney disease: Secondary | ICD-10-CM | POA: Diagnosis not present

## 2023-06-28 DIAGNOSIS — Z992 Dependence on renal dialysis: Secondary | ICD-10-CM | POA: Diagnosis not present

## 2023-06-29 DIAGNOSIS — J441 Chronic obstructive pulmonary disease with (acute) exacerbation: Secondary | ICD-10-CM | POA: Diagnosis not present

## 2023-06-29 DIAGNOSIS — I132 Hypertensive heart and chronic kidney disease with heart failure and with stage 5 chronic kidney disease, or end stage renal disease: Secondary | ICD-10-CM | POA: Diagnosis not present

## 2023-06-29 DIAGNOSIS — J9622 Acute and chronic respiratory failure with hypercapnia: Secondary | ICD-10-CM | POA: Diagnosis not present

## 2023-06-29 DIAGNOSIS — A419 Sepsis, unspecified organism: Secondary | ICD-10-CM | POA: Diagnosis not present

## 2023-06-29 DIAGNOSIS — J9621 Acute and chronic respiratory failure with hypoxia: Secondary | ICD-10-CM | POA: Diagnosis not present

## 2023-06-29 DIAGNOSIS — J189 Pneumonia, unspecified organism: Secondary | ICD-10-CM | POA: Diagnosis not present

## 2023-06-30 DIAGNOSIS — D509 Iron deficiency anemia, unspecified: Secondary | ICD-10-CM | POA: Diagnosis not present

## 2023-06-30 DIAGNOSIS — D631 Anemia in chronic kidney disease: Secondary | ICD-10-CM | POA: Diagnosis not present

## 2023-06-30 DIAGNOSIS — Z992 Dependence on renal dialysis: Secondary | ICD-10-CM | POA: Diagnosis not present

## 2023-06-30 DIAGNOSIS — N250 Renal osteodystrophy: Secondary | ICD-10-CM | POA: Diagnosis not present

## 2023-06-30 DIAGNOSIS — N186 End stage renal disease: Secondary | ICD-10-CM | POA: Diagnosis not present

## 2023-07-02 DIAGNOSIS — N250 Renal osteodystrophy: Secondary | ICD-10-CM | POA: Diagnosis not present

## 2023-07-02 DIAGNOSIS — J189 Pneumonia, unspecified organism: Secondary | ICD-10-CM | POA: Diagnosis not present

## 2023-07-02 DIAGNOSIS — I132 Hypertensive heart and chronic kidney disease with heart failure and with stage 5 chronic kidney disease, or end stage renal disease: Secondary | ICD-10-CM | POA: Diagnosis not present

## 2023-07-02 DIAGNOSIS — N186 End stage renal disease: Secondary | ICD-10-CM | POA: Diagnosis not present

## 2023-07-02 DIAGNOSIS — J9622 Acute and chronic respiratory failure with hypercapnia: Secondary | ICD-10-CM | POA: Diagnosis not present

## 2023-07-02 DIAGNOSIS — A419 Sepsis, unspecified organism: Secondary | ICD-10-CM | POA: Diagnosis not present

## 2023-07-02 DIAGNOSIS — J441 Chronic obstructive pulmonary disease with (acute) exacerbation: Secondary | ICD-10-CM | POA: Diagnosis not present

## 2023-07-02 DIAGNOSIS — J9621 Acute and chronic respiratory failure with hypoxia: Secondary | ICD-10-CM | POA: Diagnosis not present

## 2023-07-02 DIAGNOSIS — D509 Iron deficiency anemia, unspecified: Secondary | ICD-10-CM | POA: Diagnosis not present

## 2023-07-02 DIAGNOSIS — Z992 Dependence on renal dialysis: Secondary | ICD-10-CM | POA: Diagnosis not present

## 2023-07-02 DIAGNOSIS — D631 Anemia in chronic kidney disease: Secondary | ICD-10-CM | POA: Diagnosis not present

## 2023-07-05 DIAGNOSIS — N250 Renal osteodystrophy: Secondary | ICD-10-CM | POA: Diagnosis not present

## 2023-07-05 DIAGNOSIS — D509 Iron deficiency anemia, unspecified: Secondary | ICD-10-CM | POA: Diagnosis not present

## 2023-07-05 DIAGNOSIS — Z992 Dependence on renal dialysis: Secondary | ICD-10-CM | POA: Diagnosis not present

## 2023-07-05 DIAGNOSIS — D631 Anemia in chronic kidney disease: Secondary | ICD-10-CM | POA: Diagnosis not present

## 2023-07-05 DIAGNOSIS — N186 End stage renal disease: Secondary | ICD-10-CM | POA: Diagnosis not present

## 2023-07-06 DIAGNOSIS — A419 Sepsis, unspecified organism: Secondary | ICD-10-CM | POA: Diagnosis not present

## 2023-07-06 DIAGNOSIS — I132 Hypertensive heart and chronic kidney disease with heart failure and with stage 5 chronic kidney disease, or end stage renal disease: Secondary | ICD-10-CM | POA: Diagnosis not present

## 2023-07-06 DIAGNOSIS — J441 Chronic obstructive pulmonary disease with (acute) exacerbation: Secondary | ICD-10-CM | POA: Diagnosis not present

## 2023-07-06 DIAGNOSIS — J9621 Acute and chronic respiratory failure with hypoxia: Secondary | ICD-10-CM | POA: Diagnosis not present

## 2023-07-06 DIAGNOSIS — J9622 Acute and chronic respiratory failure with hypercapnia: Secondary | ICD-10-CM | POA: Diagnosis not present

## 2023-07-06 DIAGNOSIS — J189 Pneumonia, unspecified organism: Secondary | ICD-10-CM | POA: Diagnosis not present

## 2023-07-07 DIAGNOSIS — N250 Renal osteodystrophy: Secondary | ICD-10-CM | POA: Diagnosis not present

## 2023-07-07 DIAGNOSIS — D509 Iron deficiency anemia, unspecified: Secondary | ICD-10-CM | POA: Diagnosis not present

## 2023-07-07 DIAGNOSIS — N186 End stage renal disease: Secondary | ICD-10-CM | POA: Diagnosis not present

## 2023-07-07 DIAGNOSIS — Z992 Dependence on renal dialysis: Secondary | ICD-10-CM | POA: Diagnosis not present

## 2023-07-07 DIAGNOSIS — D631 Anemia in chronic kidney disease: Secondary | ICD-10-CM | POA: Diagnosis not present

## 2023-07-09 DIAGNOSIS — A419 Sepsis, unspecified organism: Secondary | ICD-10-CM | POA: Diagnosis not present

## 2023-07-09 DIAGNOSIS — J441 Chronic obstructive pulmonary disease with (acute) exacerbation: Secondary | ICD-10-CM | POA: Diagnosis not present

## 2023-07-09 DIAGNOSIS — J189 Pneumonia, unspecified organism: Secondary | ICD-10-CM | POA: Diagnosis not present

## 2023-07-09 DIAGNOSIS — J9622 Acute and chronic respiratory failure with hypercapnia: Secondary | ICD-10-CM | POA: Diagnosis not present

## 2023-07-09 DIAGNOSIS — J9621 Acute and chronic respiratory failure with hypoxia: Secondary | ICD-10-CM | POA: Diagnosis not present

## 2023-07-09 DIAGNOSIS — I132 Hypertensive heart and chronic kidney disease with heart failure and with stage 5 chronic kidney disease, or end stage renal disease: Secondary | ICD-10-CM | POA: Diagnosis not present

## 2023-07-10 DIAGNOSIS — N186 End stage renal disease: Secondary | ICD-10-CM | POA: Diagnosis not present

## 2023-07-10 DIAGNOSIS — D509 Iron deficiency anemia, unspecified: Secondary | ICD-10-CM | POA: Diagnosis not present

## 2023-07-10 DIAGNOSIS — D631 Anemia in chronic kidney disease: Secondary | ICD-10-CM | POA: Diagnosis not present

## 2023-07-10 DIAGNOSIS — N250 Renal osteodystrophy: Secondary | ICD-10-CM | POA: Diagnosis not present

## 2023-07-10 DIAGNOSIS — Z992 Dependence on renal dialysis: Secondary | ICD-10-CM | POA: Diagnosis not present

## 2023-07-11 DIAGNOSIS — N186 End stage renal disease: Secondary | ICD-10-CM | POA: Diagnosis not present

## 2023-07-12 DIAGNOSIS — I132 Hypertensive heart and chronic kidney disease with heart failure and with stage 5 chronic kidney disease, or end stage renal disease: Secondary | ICD-10-CM | POA: Diagnosis not present

## 2023-07-12 DIAGNOSIS — J9621 Acute and chronic respiratory failure with hypoxia: Secondary | ICD-10-CM | POA: Diagnosis not present

## 2023-07-12 DIAGNOSIS — N186 End stage renal disease: Secondary | ICD-10-CM | POA: Diagnosis not present

## 2023-07-12 DIAGNOSIS — J9622 Acute and chronic respiratory failure with hypercapnia: Secondary | ICD-10-CM | POA: Diagnosis not present

## 2023-07-12 DIAGNOSIS — N250 Renal osteodystrophy: Secondary | ICD-10-CM | POA: Diagnosis not present

## 2023-07-12 DIAGNOSIS — J441 Chronic obstructive pulmonary disease with (acute) exacerbation: Secondary | ICD-10-CM | POA: Diagnosis not present

## 2023-07-12 DIAGNOSIS — J189 Pneumonia, unspecified organism: Secondary | ICD-10-CM | POA: Diagnosis not present

## 2023-07-12 DIAGNOSIS — D509 Iron deficiency anemia, unspecified: Secondary | ICD-10-CM | POA: Diagnosis not present

## 2023-07-12 DIAGNOSIS — A419 Sepsis, unspecified organism: Secondary | ICD-10-CM | POA: Diagnosis not present

## 2023-07-12 DIAGNOSIS — Z992 Dependence on renal dialysis: Secondary | ICD-10-CM | POA: Diagnosis not present

## 2023-07-13 DIAGNOSIS — F419 Anxiety disorder, unspecified: Secondary | ICD-10-CM | POA: Diagnosis not present

## 2023-07-13 DIAGNOSIS — Z992 Dependence on renal dialysis: Secondary | ICD-10-CM | POA: Diagnosis not present

## 2023-07-13 DIAGNOSIS — E1122 Type 2 diabetes mellitus with diabetic chronic kidney disease: Secondary | ICD-10-CM | POA: Diagnosis not present

## 2023-07-13 DIAGNOSIS — J9621 Acute and chronic respiratory failure with hypoxia: Secondary | ICD-10-CM | POA: Diagnosis not present

## 2023-07-13 DIAGNOSIS — Z86718 Personal history of other venous thrombosis and embolism: Secondary | ICD-10-CM | POA: Diagnosis not present

## 2023-07-13 DIAGNOSIS — Z8701 Personal history of pneumonia (recurrent): Secondary | ICD-10-CM | POA: Diagnosis not present

## 2023-07-13 DIAGNOSIS — K219 Gastro-esophageal reflux disease without esophagitis: Secondary | ICD-10-CM | POA: Diagnosis not present

## 2023-07-13 DIAGNOSIS — I2510 Atherosclerotic heart disease of native coronary artery without angina pectoris: Secondary | ICD-10-CM | POA: Diagnosis not present

## 2023-07-13 DIAGNOSIS — F32A Depression, unspecified: Secondary | ICD-10-CM | POA: Diagnosis not present

## 2023-07-13 DIAGNOSIS — Z9981 Dependence on supplemental oxygen: Secondary | ICD-10-CM | POA: Diagnosis not present

## 2023-07-13 DIAGNOSIS — I5020 Unspecified systolic (congestive) heart failure: Secondary | ICD-10-CM | POA: Diagnosis not present

## 2023-07-13 DIAGNOSIS — N186 End stage renal disease: Secondary | ICD-10-CM | POA: Diagnosis not present

## 2023-07-13 DIAGNOSIS — J441 Chronic obstructive pulmonary disease with (acute) exacerbation: Secondary | ICD-10-CM | POA: Diagnosis not present

## 2023-07-13 DIAGNOSIS — Z7984 Long term (current) use of oral hypoglycemic drugs: Secondary | ICD-10-CM | POA: Diagnosis not present

## 2023-07-13 DIAGNOSIS — J9622 Acute and chronic respiratory failure with hypercapnia: Secondary | ICD-10-CM | POA: Diagnosis not present

## 2023-07-13 DIAGNOSIS — R1310 Dysphagia, unspecified: Secondary | ICD-10-CM | POA: Diagnosis not present

## 2023-07-13 DIAGNOSIS — I132 Hypertensive heart and chronic kidney disease with heart failure and with stage 5 chronic kidney disease, or end stage renal disease: Secondary | ICD-10-CM | POA: Diagnosis not present

## 2023-07-13 DIAGNOSIS — Z7951 Long term (current) use of inhaled steroids: Secondary | ICD-10-CM | POA: Diagnosis not present

## 2023-07-13 DIAGNOSIS — Z7902 Long term (current) use of antithrombotics/antiplatelets: Secondary | ICD-10-CM | POA: Diagnosis not present

## 2023-07-14 DIAGNOSIS — D509 Iron deficiency anemia, unspecified: Secondary | ICD-10-CM | POA: Diagnosis not present

## 2023-07-14 DIAGNOSIS — N186 End stage renal disease: Secondary | ICD-10-CM | POA: Diagnosis not present

## 2023-07-14 DIAGNOSIS — N250 Renal osteodystrophy: Secondary | ICD-10-CM | POA: Diagnosis not present

## 2023-07-14 DIAGNOSIS — Z992 Dependence on renal dialysis: Secondary | ICD-10-CM | POA: Diagnosis not present

## 2023-07-17 DIAGNOSIS — J441 Chronic obstructive pulmonary disease with (acute) exacerbation: Secondary | ICD-10-CM | POA: Diagnosis not present

## 2023-07-17 DIAGNOSIS — N186 End stage renal disease: Secondary | ICD-10-CM | POA: Diagnosis not present

## 2023-07-17 DIAGNOSIS — J9621 Acute and chronic respiratory failure with hypoxia: Secondary | ICD-10-CM | POA: Diagnosis not present

## 2023-07-17 DIAGNOSIS — J9622 Acute and chronic respiratory failure with hypercapnia: Secondary | ICD-10-CM | POA: Diagnosis not present

## 2023-07-17 DIAGNOSIS — R1310 Dysphagia, unspecified: Secondary | ICD-10-CM | POA: Diagnosis not present

## 2023-07-17 DIAGNOSIS — D509 Iron deficiency anemia, unspecified: Secondary | ICD-10-CM | POA: Diagnosis not present

## 2023-07-17 DIAGNOSIS — Z992 Dependence on renal dialysis: Secondary | ICD-10-CM | POA: Diagnosis not present

## 2023-07-17 DIAGNOSIS — N250 Renal osteodystrophy: Secondary | ICD-10-CM | POA: Diagnosis not present

## 2023-07-18 DIAGNOSIS — I132 Hypertensive heart and chronic kidney disease with heart failure and with stage 5 chronic kidney disease, or end stage renal disease: Secondary | ICD-10-CM | POA: Diagnosis not present

## 2023-07-18 DIAGNOSIS — I509 Heart failure, unspecified: Secondary | ICD-10-CM | POA: Diagnosis not present

## 2023-07-18 DIAGNOSIS — J9621 Acute and chronic respiratory failure with hypoxia: Secondary | ICD-10-CM | POA: Diagnosis not present

## 2023-07-18 DIAGNOSIS — Z880 Allergy status to penicillin: Secondary | ICD-10-CM | POA: Diagnosis not present

## 2023-07-18 DIAGNOSIS — J9622 Acute and chronic respiratory failure with hypercapnia: Secondary | ICD-10-CM | POA: Diagnosis not present

## 2023-07-18 DIAGNOSIS — J441 Chronic obstructive pulmonary disease with (acute) exacerbation: Secondary | ICD-10-CM | POA: Diagnosis not present

## 2023-07-18 DIAGNOSIS — R1310 Dysphagia, unspecified: Secondary | ICD-10-CM | POA: Diagnosis not present

## 2023-07-18 DIAGNOSIS — I5020 Unspecified systolic (congestive) heart failure: Secondary | ICD-10-CM | POA: Diagnosis not present

## 2023-07-19 DIAGNOSIS — N250 Renal osteodystrophy: Secondary | ICD-10-CM | POA: Diagnosis not present

## 2023-07-19 DIAGNOSIS — Z992 Dependence on renal dialysis: Secondary | ICD-10-CM | POA: Diagnosis not present

## 2023-07-19 DIAGNOSIS — D509 Iron deficiency anemia, unspecified: Secondary | ICD-10-CM | POA: Diagnosis not present

## 2023-07-19 DIAGNOSIS — N186 End stage renal disease: Secondary | ICD-10-CM | POA: Diagnosis not present

## 2023-07-21 DIAGNOSIS — D509 Iron deficiency anemia, unspecified: Secondary | ICD-10-CM | POA: Diagnosis not present

## 2023-07-21 DIAGNOSIS — Z992 Dependence on renal dialysis: Secondary | ICD-10-CM | POA: Diagnosis not present

## 2023-07-21 DIAGNOSIS — N250 Renal osteodystrophy: Secondary | ICD-10-CM | POA: Diagnosis not present

## 2023-07-21 DIAGNOSIS — N186 End stage renal disease: Secondary | ICD-10-CM | POA: Diagnosis not present

## 2023-07-23 DIAGNOSIS — J441 Chronic obstructive pulmonary disease with (acute) exacerbation: Secondary | ICD-10-CM | POA: Diagnosis not present

## 2023-07-23 DIAGNOSIS — I132 Hypertensive heart and chronic kidney disease with heart failure and with stage 5 chronic kidney disease, or end stage renal disease: Secondary | ICD-10-CM | POA: Diagnosis not present

## 2023-07-23 DIAGNOSIS — J9621 Acute and chronic respiratory failure with hypoxia: Secondary | ICD-10-CM | POA: Diagnosis not present

## 2023-07-23 DIAGNOSIS — R1310 Dysphagia, unspecified: Secondary | ICD-10-CM | POA: Diagnosis not present

## 2023-07-23 DIAGNOSIS — I5020 Unspecified systolic (congestive) heart failure: Secondary | ICD-10-CM | POA: Diagnosis not present

## 2023-07-23 DIAGNOSIS — J9622 Acute and chronic respiratory failure with hypercapnia: Secondary | ICD-10-CM | POA: Diagnosis not present

## 2023-07-24 DIAGNOSIS — N250 Renal osteodystrophy: Secondary | ICD-10-CM | POA: Diagnosis not present

## 2023-07-24 DIAGNOSIS — I5022 Chronic systolic (congestive) heart failure: Secondary | ICD-10-CM | POA: Diagnosis not present

## 2023-07-24 DIAGNOSIS — N186 End stage renal disease: Secondary | ICD-10-CM | POA: Diagnosis not present

## 2023-07-24 DIAGNOSIS — J439 Emphysema, unspecified: Secondary | ICD-10-CM | POA: Diagnosis not present

## 2023-07-24 DIAGNOSIS — J441 Chronic obstructive pulmonary disease with (acute) exacerbation: Secondary | ICD-10-CM | POA: Diagnosis not present

## 2023-07-24 DIAGNOSIS — D509 Iron deficiency anemia, unspecified: Secondary | ICD-10-CM | POA: Diagnosis not present

## 2023-07-24 DIAGNOSIS — I700 Atherosclerosis of aorta: Secondary | ICD-10-CM | POA: Diagnosis not present

## 2023-07-24 DIAGNOSIS — Z299 Encounter for prophylactic measures, unspecified: Secondary | ICD-10-CM | POA: Diagnosis not present

## 2023-07-24 DIAGNOSIS — I25119 Atherosclerotic heart disease of native coronary artery with unspecified angina pectoris: Secondary | ICD-10-CM | POA: Diagnosis not present

## 2023-07-24 DIAGNOSIS — Z992 Dependence on renal dialysis: Secondary | ICD-10-CM | POA: Diagnosis not present

## 2023-07-26 DIAGNOSIS — Z992 Dependence on renal dialysis: Secondary | ICD-10-CM | POA: Diagnosis not present

## 2023-07-26 DIAGNOSIS — N186 End stage renal disease: Secondary | ICD-10-CM | POA: Diagnosis not present

## 2023-07-26 DIAGNOSIS — N250 Renal osteodystrophy: Secondary | ICD-10-CM | POA: Diagnosis not present

## 2023-07-26 DIAGNOSIS — D509 Iron deficiency anemia, unspecified: Secondary | ICD-10-CM | POA: Diagnosis not present

## 2023-07-27 DIAGNOSIS — J441 Chronic obstructive pulmonary disease with (acute) exacerbation: Secondary | ICD-10-CM | POA: Diagnosis not present

## 2023-07-27 DIAGNOSIS — R1310 Dysphagia, unspecified: Secondary | ICD-10-CM | POA: Diagnosis not present

## 2023-07-27 DIAGNOSIS — J9622 Acute and chronic respiratory failure with hypercapnia: Secondary | ICD-10-CM | POA: Diagnosis not present

## 2023-07-27 DIAGNOSIS — I5020 Unspecified systolic (congestive) heart failure: Secondary | ICD-10-CM | POA: Diagnosis not present

## 2023-07-27 DIAGNOSIS — J9621 Acute and chronic respiratory failure with hypoxia: Secondary | ICD-10-CM | POA: Diagnosis not present

## 2023-07-27 DIAGNOSIS — I132 Hypertensive heart and chronic kidney disease with heart failure and with stage 5 chronic kidney disease, or end stage renal disease: Secondary | ICD-10-CM | POA: Diagnosis not present

## 2023-07-28 DIAGNOSIS — N186 End stage renal disease: Secondary | ICD-10-CM | POA: Diagnosis not present

## 2023-07-28 DIAGNOSIS — N250 Renal osteodystrophy: Secondary | ICD-10-CM | POA: Diagnosis not present

## 2023-07-28 DIAGNOSIS — Z992 Dependence on renal dialysis: Secondary | ICD-10-CM | POA: Diagnosis not present

## 2023-07-28 DIAGNOSIS — D509 Iron deficiency anemia, unspecified: Secondary | ICD-10-CM | POA: Diagnosis not present

## 2023-07-30 DIAGNOSIS — J9621 Acute and chronic respiratory failure with hypoxia: Secondary | ICD-10-CM | POA: Diagnosis not present

## 2023-07-30 DIAGNOSIS — J9622 Acute and chronic respiratory failure with hypercapnia: Secondary | ICD-10-CM | POA: Diagnosis not present

## 2023-07-30 DIAGNOSIS — R1310 Dysphagia, unspecified: Secondary | ICD-10-CM | POA: Diagnosis not present

## 2023-07-30 DIAGNOSIS — I132 Hypertensive heart and chronic kidney disease with heart failure and with stage 5 chronic kidney disease, or end stage renal disease: Secondary | ICD-10-CM | POA: Diagnosis not present

## 2023-07-30 DIAGNOSIS — J441 Chronic obstructive pulmonary disease with (acute) exacerbation: Secondary | ICD-10-CM | POA: Diagnosis not present

## 2023-07-30 DIAGNOSIS — I5020 Unspecified systolic (congestive) heart failure: Secondary | ICD-10-CM | POA: Diagnosis not present

## 2023-07-31 DIAGNOSIS — Z992 Dependence on renal dialysis: Secondary | ICD-10-CM | POA: Diagnosis not present

## 2023-07-31 DIAGNOSIS — D509 Iron deficiency anemia, unspecified: Secondary | ICD-10-CM | POA: Diagnosis not present

## 2023-07-31 DIAGNOSIS — N250 Renal osteodystrophy: Secondary | ICD-10-CM | POA: Diagnosis not present

## 2023-07-31 DIAGNOSIS — Z79891 Long term (current) use of opiate analgesic: Secondary | ICD-10-CM | POA: Diagnosis not present

## 2023-07-31 DIAGNOSIS — N186 End stage renal disease: Secondary | ICD-10-CM | POA: Diagnosis not present

## 2023-08-01 DIAGNOSIS — J9611 Chronic respiratory failure with hypoxia: Secondary | ICD-10-CM | POA: Diagnosis not present

## 2023-08-02 DIAGNOSIS — N186 End stage renal disease: Secondary | ICD-10-CM | POA: Diagnosis not present

## 2023-08-02 DIAGNOSIS — N250 Renal osteodystrophy: Secondary | ICD-10-CM | POA: Diagnosis not present

## 2023-08-02 DIAGNOSIS — D509 Iron deficiency anemia, unspecified: Secondary | ICD-10-CM | POA: Diagnosis not present

## 2023-08-02 DIAGNOSIS — Z992 Dependence on renal dialysis: Secondary | ICD-10-CM | POA: Diagnosis not present

## 2023-08-04 DIAGNOSIS — Z992 Dependence on renal dialysis: Secondary | ICD-10-CM | POA: Diagnosis not present

## 2023-08-04 DIAGNOSIS — D509 Iron deficiency anemia, unspecified: Secondary | ICD-10-CM | POA: Diagnosis not present

## 2023-08-04 DIAGNOSIS — N250 Renal osteodystrophy: Secondary | ICD-10-CM | POA: Diagnosis not present

## 2023-08-04 DIAGNOSIS — N186 End stage renal disease: Secondary | ICD-10-CM | POA: Diagnosis not present

## 2023-08-06 DIAGNOSIS — I132 Hypertensive heart and chronic kidney disease with heart failure and with stage 5 chronic kidney disease, or end stage renal disease: Secondary | ICD-10-CM | POA: Diagnosis not present

## 2023-08-06 DIAGNOSIS — J9622 Acute and chronic respiratory failure with hypercapnia: Secondary | ICD-10-CM | POA: Diagnosis not present

## 2023-08-06 DIAGNOSIS — J441 Chronic obstructive pulmonary disease with (acute) exacerbation: Secondary | ICD-10-CM | POA: Diagnosis not present

## 2023-08-06 DIAGNOSIS — J9621 Acute and chronic respiratory failure with hypoxia: Secondary | ICD-10-CM | POA: Diagnosis not present

## 2023-08-06 DIAGNOSIS — R1310 Dysphagia, unspecified: Secondary | ICD-10-CM | POA: Diagnosis not present

## 2023-08-06 DIAGNOSIS — I5020 Unspecified systolic (congestive) heart failure: Secondary | ICD-10-CM | POA: Diagnosis not present

## 2023-08-06 DIAGNOSIS — B351 Tinea unguium: Secondary | ICD-10-CM | POA: Diagnosis not present

## 2023-08-06 DIAGNOSIS — E1140 Type 2 diabetes mellitus with diabetic neuropathy, unspecified: Secondary | ICD-10-CM | POA: Diagnosis not present

## 2023-08-07 DIAGNOSIS — D509 Iron deficiency anemia, unspecified: Secondary | ICD-10-CM | POA: Diagnosis not present

## 2023-08-07 DIAGNOSIS — Z992 Dependence on renal dialysis: Secondary | ICD-10-CM | POA: Diagnosis not present

## 2023-08-07 DIAGNOSIS — N186 End stage renal disease: Secondary | ICD-10-CM | POA: Diagnosis not present

## 2023-08-07 DIAGNOSIS — N250 Renal osteodystrophy: Secondary | ICD-10-CM | POA: Diagnosis not present

## 2023-08-09 DIAGNOSIS — D509 Iron deficiency anemia, unspecified: Secondary | ICD-10-CM | POA: Diagnosis not present

## 2023-08-09 DIAGNOSIS — Z992 Dependence on renal dialysis: Secondary | ICD-10-CM | POA: Diagnosis not present

## 2023-08-09 DIAGNOSIS — N250 Renal osteodystrophy: Secondary | ICD-10-CM | POA: Diagnosis not present

## 2023-08-09 DIAGNOSIS — N186 End stage renal disease: Secondary | ICD-10-CM | POA: Diagnosis not present

## 2023-08-10 DIAGNOSIS — J9622 Acute and chronic respiratory failure with hypercapnia: Secondary | ICD-10-CM | POA: Diagnosis not present

## 2023-08-10 DIAGNOSIS — J9621 Acute and chronic respiratory failure with hypoxia: Secondary | ICD-10-CM | POA: Diagnosis not present

## 2023-08-10 DIAGNOSIS — I5020 Unspecified systolic (congestive) heart failure: Secondary | ICD-10-CM | POA: Diagnosis not present

## 2023-08-10 DIAGNOSIS — R1310 Dysphagia, unspecified: Secondary | ICD-10-CM | POA: Diagnosis not present

## 2023-08-10 DIAGNOSIS — J441 Chronic obstructive pulmonary disease with (acute) exacerbation: Secondary | ICD-10-CM | POA: Diagnosis not present

## 2023-08-10 DIAGNOSIS — I132 Hypertensive heart and chronic kidney disease with heart failure and with stage 5 chronic kidney disease, or end stage renal disease: Secondary | ICD-10-CM | POA: Diagnosis not present

## 2023-08-11 DIAGNOSIS — N186 End stage renal disease: Secondary | ICD-10-CM | POA: Diagnosis not present

## 2023-08-11 DIAGNOSIS — D509 Iron deficiency anemia, unspecified: Secondary | ICD-10-CM | POA: Diagnosis not present

## 2023-08-11 DIAGNOSIS — N250 Renal osteodystrophy: Secondary | ICD-10-CM | POA: Diagnosis not present

## 2023-08-11 DIAGNOSIS — D631 Anemia in chronic kidney disease: Secondary | ICD-10-CM | POA: Diagnosis not present

## 2023-08-11 DIAGNOSIS — Z992 Dependence on renal dialysis: Secondary | ICD-10-CM | POA: Diagnosis not present

## 2023-08-12 DIAGNOSIS — E1122 Type 2 diabetes mellitus with diabetic chronic kidney disease: Secondary | ICD-10-CM | POA: Diagnosis not present

## 2023-08-12 DIAGNOSIS — F32A Depression, unspecified: Secondary | ICD-10-CM | POA: Diagnosis not present

## 2023-08-12 DIAGNOSIS — I2510 Atherosclerotic heart disease of native coronary artery without angina pectoris: Secondary | ICD-10-CM | POA: Diagnosis not present

## 2023-08-12 DIAGNOSIS — N186 End stage renal disease: Secondary | ICD-10-CM | POA: Diagnosis not present

## 2023-08-12 DIAGNOSIS — Z9981 Dependence on supplemental oxygen: Secondary | ICD-10-CM | POA: Diagnosis not present

## 2023-08-12 DIAGNOSIS — Z86718 Personal history of other venous thrombosis and embolism: Secondary | ICD-10-CM | POA: Diagnosis not present

## 2023-08-12 DIAGNOSIS — K219 Gastro-esophageal reflux disease without esophagitis: Secondary | ICD-10-CM | POA: Diagnosis not present

## 2023-08-12 DIAGNOSIS — R1310 Dysphagia, unspecified: Secondary | ICD-10-CM | POA: Diagnosis not present

## 2023-08-12 DIAGNOSIS — Z8701 Personal history of pneumonia (recurrent): Secondary | ICD-10-CM | POA: Diagnosis not present

## 2023-08-12 DIAGNOSIS — Z7984 Long term (current) use of oral hypoglycemic drugs: Secondary | ICD-10-CM | POA: Diagnosis not present

## 2023-08-12 DIAGNOSIS — J9621 Acute and chronic respiratory failure with hypoxia: Secondary | ICD-10-CM | POA: Diagnosis not present

## 2023-08-12 DIAGNOSIS — I5020 Unspecified systolic (congestive) heart failure: Secondary | ICD-10-CM | POA: Diagnosis not present

## 2023-08-12 DIAGNOSIS — Z992 Dependence on renal dialysis: Secondary | ICD-10-CM | POA: Diagnosis not present

## 2023-08-12 DIAGNOSIS — Z7951 Long term (current) use of inhaled steroids: Secondary | ICD-10-CM | POA: Diagnosis not present

## 2023-08-12 DIAGNOSIS — F419 Anxiety disorder, unspecified: Secondary | ICD-10-CM | POA: Diagnosis not present

## 2023-08-12 DIAGNOSIS — I132 Hypertensive heart and chronic kidney disease with heart failure and with stage 5 chronic kidney disease, or end stage renal disease: Secondary | ICD-10-CM | POA: Diagnosis not present

## 2023-08-12 DIAGNOSIS — Z7902 Long term (current) use of antithrombotics/antiplatelets: Secondary | ICD-10-CM | POA: Diagnosis not present

## 2023-08-12 DIAGNOSIS — J441 Chronic obstructive pulmonary disease with (acute) exacerbation: Secondary | ICD-10-CM | POA: Diagnosis not present

## 2023-08-12 DIAGNOSIS — J9622 Acute and chronic respiratory failure with hypercapnia: Secondary | ICD-10-CM | POA: Diagnosis not present

## 2023-08-13 DIAGNOSIS — J9622 Acute and chronic respiratory failure with hypercapnia: Secondary | ICD-10-CM | POA: Diagnosis not present

## 2023-08-13 DIAGNOSIS — J9621 Acute and chronic respiratory failure with hypoxia: Secondary | ICD-10-CM | POA: Diagnosis not present

## 2023-08-13 DIAGNOSIS — R1310 Dysphagia, unspecified: Secondary | ICD-10-CM | POA: Diagnosis not present

## 2023-08-13 DIAGNOSIS — I132 Hypertensive heart and chronic kidney disease with heart failure and with stage 5 chronic kidney disease, or end stage renal disease: Secondary | ICD-10-CM | POA: Diagnosis not present

## 2023-08-13 DIAGNOSIS — J441 Chronic obstructive pulmonary disease with (acute) exacerbation: Secondary | ICD-10-CM | POA: Diagnosis not present

## 2023-08-13 DIAGNOSIS — I5020 Unspecified systolic (congestive) heart failure: Secondary | ICD-10-CM | POA: Diagnosis not present

## 2023-08-14 DIAGNOSIS — Z79891 Long term (current) use of opiate analgesic: Secondary | ICD-10-CM | POA: Diagnosis not present

## 2023-08-14 DIAGNOSIS — N250 Renal osteodystrophy: Secondary | ICD-10-CM | POA: Diagnosis not present

## 2023-08-14 DIAGNOSIS — Z992 Dependence on renal dialysis: Secondary | ICD-10-CM | POA: Diagnosis not present

## 2023-08-14 DIAGNOSIS — N186 End stage renal disease: Secondary | ICD-10-CM | POA: Diagnosis not present

## 2023-08-14 DIAGNOSIS — D631 Anemia in chronic kidney disease: Secondary | ICD-10-CM | POA: Diagnosis not present

## 2023-08-14 DIAGNOSIS — M549 Dorsalgia, unspecified: Secondary | ICD-10-CM | POA: Diagnosis not present

## 2023-08-14 DIAGNOSIS — G894 Chronic pain syndrome: Secondary | ICD-10-CM | POA: Diagnosis not present

## 2023-08-14 DIAGNOSIS — M25519 Pain in unspecified shoulder: Secondary | ICD-10-CM | POA: Diagnosis not present

## 2023-08-14 DIAGNOSIS — D509 Iron deficiency anemia, unspecified: Secondary | ICD-10-CM | POA: Diagnosis not present

## 2023-08-14 DIAGNOSIS — M79606 Pain in leg, unspecified: Secondary | ICD-10-CM | POA: Diagnosis not present

## 2023-08-16 DIAGNOSIS — Z992 Dependence on renal dialysis: Secondary | ICD-10-CM | POA: Diagnosis not present

## 2023-08-16 DIAGNOSIS — N250 Renal osteodystrophy: Secondary | ICD-10-CM | POA: Diagnosis not present

## 2023-08-16 DIAGNOSIS — D631 Anemia in chronic kidney disease: Secondary | ICD-10-CM | POA: Diagnosis not present

## 2023-08-16 DIAGNOSIS — D509 Iron deficiency anemia, unspecified: Secondary | ICD-10-CM | POA: Diagnosis not present

## 2023-08-16 DIAGNOSIS — N186 End stage renal disease: Secondary | ICD-10-CM | POA: Diagnosis not present

## 2023-08-18 DIAGNOSIS — N186 End stage renal disease: Secondary | ICD-10-CM | POA: Diagnosis not present

## 2023-08-18 DIAGNOSIS — N250 Renal osteodystrophy: Secondary | ICD-10-CM | POA: Diagnosis not present

## 2023-08-18 DIAGNOSIS — D631 Anemia in chronic kidney disease: Secondary | ICD-10-CM | POA: Diagnosis not present

## 2023-08-18 DIAGNOSIS — D509 Iron deficiency anemia, unspecified: Secondary | ICD-10-CM | POA: Diagnosis not present

## 2023-08-18 DIAGNOSIS — Z992 Dependence on renal dialysis: Secondary | ICD-10-CM | POA: Diagnosis not present

## 2023-08-20 DIAGNOSIS — D631 Anemia in chronic kidney disease: Secondary | ICD-10-CM | POA: Diagnosis not present

## 2023-08-20 DIAGNOSIS — N250 Renal osteodystrophy: Secondary | ICD-10-CM | POA: Diagnosis not present

## 2023-08-20 DIAGNOSIS — Z992 Dependence on renal dialysis: Secondary | ICD-10-CM | POA: Diagnosis not present

## 2023-08-20 DIAGNOSIS — N186 End stage renal disease: Secondary | ICD-10-CM | POA: Diagnosis not present

## 2023-08-20 DIAGNOSIS — D509 Iron deficiency anemia, unspecified: Secondary | ICD-10-CM | POA: Diagnosis not present

## 2023-08-23 DIAGNOSIS — N186 End stage renal disease: Secondary | ICD-10-CM | POA: Diagnosis not present

## 2023-08-23 DIAGNOSIS — Z992 Dependence on renal dialysis: Secondary | ICD-10-CM | POA: Diagnosis not present

## 2023-08-23 DIAGNOSIS — D631 Anemia in chronic kidney disease: Secondary | ICD-10-CM | POA: Diagnosis not present

## 2023-08-23 DIAGNOSIS — D509 Iron deficiency anemia, unspecified: Secondary | ICD-10-CM | POA: Diagnosis not present

## 2023-08-23 DIAGNOSIS — N250 Renal osteodystrophy: Secondary | ICD-10-CM | POA: Diagnosis not present

## 2023-08-24 DIAGNOSIS — Z992 Dependence on renal dialysis: Secondary | ICD-10-CM | POA: Diagnosis not present

## 2023-08-24 DIAGNOSIS — N186 End stage renal disease: Secondary | ICD-10-CM | POA: Diagnosis not present

## 2023-08-24 DIAGNOSIS — J9622 Acute and chronic respiratory failure with hypercapnia: Secondary | ICD-10-CM | POA: Diagnosis not present

## 2023-08-24 DIAGNOSIS — R1310 Dysphagia, unspecified: Secondary | ICD-10-CM | POA: Diagnosis not present

## 2023-08-24 DIAGNOSIS — T8249XA Other complication of vascular dialysis catheter, initial encounter: Secondary | ICD-10-CM | POA: Diagnosis not present

## 2023-08-24 DIAGNOSIS — I132 Hypertensive heart and chronic kidney disease with heart failure and with stage 5 chronic kidney disease, or end stage renal disease: Secondary | ICD-10-CM | POA: Diagnosis not present

## 2023-08-24 DIAGNOSIS — I5020 Unspecified systolic (congestive) heart failure: Secondary | ICD-10-CM | POA: Diagnosis not present

## 2023-08-24 DIAGNOSIS — J441 Chronic obstructive pulmonary disease with (acute) exacerbation: Secondary | ICD-10-CM | POA: Diagnosis not present

## 2023-08-24 DIAGNOSIS — J9621 Acute and chronic respiratory failure with hypoxia: Secondary | ICD-10-CM | POA: Diagnosis not present

## 2023-08-25 DIAGNOSIS — N250 Renal osteodystrophy: Secondary | ICD-10-CM | POA: Diagnosis not present

## 2023-08-25 DIAGNOSIS — N186 End stage renal disease: Secondary | ICD-10-CM | POA: Diagnosis not present

## 2023-08-25 DIAGNOSIS — D631 Anemia in chronic kidney disease: Secondary | ICD-10-CM | POA: Diagnosis not present

## 2023-08-25 DIAGNOSIS — D509 Iron deficiency anemia, unspecified: Secondary | ICD-10-CM | POA: Diagnosis not present

## 2023-08-25 DIAGNOSIS — Z992 Dependence on renal dialysis: Secondary | ICD-10-CM | POA: Diagnosis not present

## 2023-08-27 DIAGNOSIS — I132 Hypertensive heart and chronic kidney disease with heart failure and with stage 5 chronic kidney disease, or end stage renal disease: Secondary | ICD-10-CM | POA: Diagnosis not present

## 2023-08-27 DIAGNOSIS — R1310 Dysphagia, unspecified: Secondary | ICD-10-CM | POA: Diagnosis not present

## 2023-08-27 DIAGNOSIS — I5020 Unspecified systolic (congestive) heart failure: Secondary | ICD-10-CM | POA: Diagnosis not present

## 2023-08-27 DIAGNOSIS — J9622 Acute and chronic respiratory failure with hypercapnia: Secondary | ICD-10-CM | POA: Diagnosis not present

## 2023-08-27 DIAGNOSIS — J9621 Acute and chronic respiratory failure with hypoxia: Secondary | ICD-10-CM | POA: Diagnosis not present

## 2023-08-27 DIAGNOSIS — J441 Chronic obstructive pulmonary disease with (acute) exacerbation: Secondary | ICD-10-CM | POA: Diagnosis not present

## 2023-08-28 DIAGNOSIS — D631 Anemia in chronic kidney disease: Secondary | ICD-10-CM | POA: Diagnosis not present

## 2023-08-28 DIAGNOSIS — D509 Iron deficiency anemia, unspecified: Secondary | ICD-10-CM | POA: Diagnosis not present

## 2023-08-28 DIAGNOSIS — N250 Renal osteodystrophy: Secondary | ICD-10-CM | POA: Diagnosis not present

## 2023-08-28 DIAGNOSIS — N186 End stage renal disease: Secondary | ICD-10-CM | POA: Diagnosis not present

## 2023-08-28 DIAGNOSIS — Z992 Dependence on renal dialysis: Secondary | ICD-10-CM | POA: Diagnosis not present

## 2023-08-30 DIAGNOSIS — N186 End stage renal disease: Secondary | ICD-10-CM | POA: Diagnosis not present

## 2023-08-30 DIAGNOSIS — N250 Renal osteodystrophy: Secondary | ICD-10-CM | POA: Diagnosis not present

## 2023-08-30 DIAGNOSIS — D631 Anemia in chronic kidney disease: Secondary | ICD-10-CM | POA: Diagnosis not present

## 2023-08-30 DIAGNOSIS — D509 Iron deficiency anemia, unspecified: Secondary | ICD-10-CM | POA: Diagnosis not present

## 2023-08-30 DIAGNOSIS — Z992 Dependence on renal dialysis: Secondary | ICD-10-CM | POA: Diagnosis not present

## 2023-08-31 DIAGNOSIS — I10 Essential (primary) hypertension: Secondary | ICD-10-CM | POA: Diagnosis not present

## 2023-08-31 DIAGNOSIS — J9622 Acute and chronic respiratory failure with hypercapnia: Secondary | ICD-10-CM | POA: Diagnosis not present

## 2023-08-31 DIAGNOSIS — I5020 Unspecified systolic (congestive) heart failure: Secondary | ICD-10-CM | POA: Diagnosis not present

## 2023-08-31 DIAGNOSIS — J441 Chronic obstructive pulmonary disease with (acute) exacerbation: Secondary | ICD-10-CM | POA: Diagnosis not present

## 2023-08-31 DIAGNOSIS — J9621 Acute and chronic respiratory failure with hypoxia: Secondary | ICD-10-CM | POA: Diagnosis not present

## 2023-08-31 DIAGNOSIS — I2510 Atherosclerotic heart disease of native coronary artery without angina pectoris: Secondary | ICD-10-CM | POA: Diagnosis not present

## 2023-08-31 DIAGNOSIS — I132 Hypertensive heart and chronic kidney disease with heart failure and with stage 5 chronic kidney disease, or end stage renal disease: Secondary | ICD-10-CM | POA: Diagnosis not present

## 2023-08-31 DIAGNOSIS — I509 Heart failure, unspecified: Secondary | ICD-10-CM | POA: Diagnosis not present

## 2023-08-31 DIAGNOSIS — R1310 Dysphagia, unspecified: Secondary | ICD-10-CM | POA: Diagnosis not present

## 2023-08-31 DIAGNOSIS — Z931 Gastrostomy status: Secondary | ICD-10-CM | POA: Diagnosis not present

## 2023-09-01 DIAGNOSIS — N250 Renal osteodystrophy: Secondary | ICD-10-CM | POA: Diagnosis not present

## 2023-09-01 DIAGNOSIS — Z992 Dependence on renal dialysis: Secondary | ICD-10-CM | POA: Diagnosis not present

## 2023-09-01 DIAGNOSIS — N186 End stage renal disease: Secondary | ICD-10-CM | POA: Diagnosis not present

## 2023-09-01 DIAGNOSIS — D631 Anemia in chronic kidney disease: Secondary | ICD-10-CM | POA: Diagnosis not present

## 2023-09-01 DIAGNOSIS — D509 Iron deficiency anemia, unspecified: Secondary | ICD-10-CM | POA: Diagnosis not present

## 2023-09-04 DIAGNOSIS — D509 Iron deficiency anemia, unspecified: Secondary | ICD-10-CM | POA: Diagnosis not present

## 2023-09-04 DIAGNOSIS — Z992 Dependence on renal dialysis: Secondary | ICD-10-CM | POA: Diagnosis not present

## 2023-09-04 DIAGNOSIS — N250 Renal osteodystrophy: Secondary | ICD-10-CM | POA: Diagnosis not present

## 2023-09-04 DIAGNOSIS — N186 End stage renal disease: Secondary | ICD-10-CM | POA: Diagnosis not present

## 2023-09-04 DIAGNOSIS — D631 Anemia in chronic kidney disease: Secondary | ICD-10-CM | POA: Diagnosis not present

## 2023-09-06 DIAGNOSIS — D509 Iron deficiency anemia, unspecified: Secondary | ICD-10-CM | POA: Diagnosis not present

## 2023-09-06 DIAGNOSIS — Z992 Dependence on renal dialysis: Secondary | ICD-10-CM | POA: Diagnosis not present

## 2023-09-06 DIAGNOSIS — D631 Anemia in chronic kidney disease: Secondary | ICD-10-CM | POA: Diagnosis not present

## 2023-09-06 DIAGNOSIS — N250 Renal osteodystrophy: Secondary | ICD-10-CM | POA: Diagnosis not present

## 2023-09-06 DIAGNOSIS — N186 End stage renal disease: Secondary | ICD-10-CM | POA: Diagnosis not present

## 2023-09-07 DIAGNOSIS — R252 Cramp and spasm: Secondary | ICD-10-CM | POA: Diagnosis not present

## 2023-09-07 DIAGNOSIS — I2510 Atherosclerotic heart disease of native coronary artery without angina pectoris: Secondary | ICD-10-CM | POA: Diagnosis not present

## 2023-09-07 DIAGNOSIS — Z7689 Persons encountering health services in other specified circumstances: Secondary | ICD-10-CM | POA: Diagnosis not present

## 2023-09-07 DIAGNOSIS — I132 Hypertensive heart and chronic kidney disease with heart failure and with stage 5 chronic kidney disease, or end stage renal disease: Secondary | ICD-10-CM | POA: Diagnosis not present

## 2023-09-07 DIAGNOSIS — I129 Hypertensive chronic kidney disease with stage 1 through stage 4 chronic kidney disease, or unspecified chronic kidney disease: Secondary | ICD-10-CM | POA: Diagnosis not present

## 2023-09-07 DIAGNOSIS — I5032 Chronic diastolic (congestive) heart failure: Secondary | ICD-10-CM | POA: Diagnosis not present

## 2023-09-07 DIAGNOSIS — J9621 Acute and chronic respiratory failure with hypoxia: Secondary | ICD-10-CM | POA: Diagnosis not present

## 2023-09-07 DIAGNOSIS — E1122 Type 2 diabetes mellitus with diabetic chronic kidney disease: Secondary | ICD-10-CM | POA: Diagnosis not present

## 2023-09-07 DIAGNOSIS — J441 Chronic obstructive pulmonary disease with (acute) exacerbation: Secondary | ICD-10-CM | POA: Diagnosis not present

## 2023-09-07 DIAGNOSIS — I5020 Unspecified systolic (congestive) heart failure: Secondary | ICD-10-CM | POA: Diagnosis not present

## 2023-09-07 DIAGNOSIS — R1310 Dysphagia, unspecified: Secondary | ICD-10-CM | POA: Diagnosis not present

## 2023-09-07 DIAGNOSIS — N186 End stage renal disease: Secondary | ICD-10-CM | POA: Diagnosis not present

## 2023-09-07 DIAGNOSIS — Z992 Dependence on renal dialysis: Secondary | ICD-10-CM | POA: Diagnosis not present

## 2023-09-07 DIAGNOSIS — J9622 Acute and chronic respiratory failure with hypercapnia: Secondary | ICD-10-CM | POA: Diagnosis not present

## 2023-09-08 DIAGNOSIS — D509 Iron deficiency anemia, unspecified: Secondary | ICD-10-CM | POA: Diagnosis not present

## 2023-09-08 DIAGNOSIS — N186 End stage renal disease: Secondary | ICD-10-CM | POA: Diagnosis not present

## 2023-09-08 DIAGNOSIS — N250 Renal osteodystrophy: Secondary | ICD-10-CM | POA: Diagnosis not present

## 2023-09-08 DIAGNOSIS — D631 Anemia in chronic kidney disease: Secondary | ICD-10-CM | POA: Diagnosis not present

## 2023-09-08 DIAGNOSIS — Z992 Dependence on renal dialysis: Secondary | ICD-10-CM | POA: Diagnosis not present

## 2023-09-10 DIAGNOSIS — I5020 Unspecified systolic (congestive) heart failure: Secondary | ICD-10-CM | POA: Diagnosis not present

## 2023-09-10 DIAGNOSIS — I132 Hypertensive heart and chronic kidney disease with heart failure and with stage 5 chronic kidney disease, or end stage renal disease: Secondary | ICD-10-CM | POA: Diagnosis not present

## 2023-09-10 DIAGNOSIS — R1310 Dysphagia, unspecified: Secondary | ICD-10-CM | POA: Diagnosis not present

## 2023-09-10 DIAGNOSIS — J9621 Acute and chronic respiratory failure with hypoxia: Secondary | ICD-10-CM | POA: Diagnosis not present

## 2023-09-10 DIAGNOSIS — J9622 Acute and chronic respiratory failure with hypercapnia: Secondary | ICD-10-CM | POA: Diagnosis not present

## 2023-09-10 DIAGNOSIS — J441 Chronic obstructive pulmonary disease with (acute) exacerbation: Secondary | ICD-10-CM | POA: Diagnosis not present

## 2023-09-11 DIAGNOSIS — Z86718 Personal history of other venous thrombosis and embolism: Secondary | ICD-10-CM | POA: Diagnosis not present

## 2023-09-11 DIAGNOSIS — Z7951 Long term (current) use of inhaled steroids: Secondary | ICD-10-CM | POA: Diagnosis not present

## 2023-09-11 DIAGNOSIS — R1310 Dysphagia, unspecified: Secondary | ICD-10-CM | POA: Diagnosis not present

## 2023-09-11 DIAGNOSIS — Z7902 Long term (current) use of antithrombotics/antiplatelets: Secondary | ICD-10-CM | POA: Diagnosis not present

## 2023-09-11 DIAGNOSIS — J441 Chronic obstructive pulmonary disease with (acute) exacerbation: Secondary | ICD-10-CM | POA: Diagnosis not present

## 2023-09-11 DIAGNOSIS — Z992 Dependence on renal dialysis: Secondary | ICD-10-CM | POA: Diagnosis not present

## 2023-09-11 DIAGNOSIS — Z9981 Dependence on supplemental oxygen: Secondary | ICD-10-CM | POA: Diagnosis not present

## 2023-09-11 DIAGNOSIS — I5020 Unspecified systolic (congestive) heart failure: Secondary | ICD-10-CM | POA: Diagnosis not present

## 2023-09-11 DIAGNOSIS — D509 Iron deficiency anemia, unspecified: Secondary | ICD-10-CM | POA: Diagnosis not present

## 2023-09-11 DIAGNOSIS — K219 Gastro-esophageal reflux disease without esophagitis: Secondary | ICD-10-CM | POA: Diagnosis not present

## 2023-09-11 DIAGNOSIS — D631 Anemia in chronic kidney disease: Secondary | ICD-10-CM | POA: Diagnosis not present

## 2023-09-11 DIAGNOSIS — E1122 Type 2 diabetes mellitus with diabetic chronic kidney disease: Secondary | ICD-10-CM | POA: Diagnosis not present

## 2023-09-11 DIAGNOSIS — J9621 Acute and chronic respiratory failure with hypoxia: Secondary | ICD-10-CM | POA: Diagnosis not present

## 2023-09-11 DIAGNOSIS — F32A Depression, unspecified: Secondary | ICD-10-CM | POA: Diagnosis not present

## 2023-09-11 DIAGNOSIS — J9622 Acute and chronic respiratory failure with hypercapnia: Secondary | ICD-10-CM | POA: Diagnosis not present

## 2023-09-11 DIAGNOSIS — N250 Renal osteodystrophy: Secondary | ICD-10-CM | POA: Diagnosis not present

## 2023-09-11 DIAGNOSIS — I2510 Atherosclerotic heart disease of native coronary artery without angina pectoris: Secondary | ICD-10-CM | POA: Diagnosis not present

## 2023-09-11 DIAGNOSIS — N186 End stage renal disease: Secondary | ICD-10-CM | POA: Diagnosis not present

## 2023-09-11 DIAGNOSIS — Z8701 Personal history of pneumonia (recurrent): Secondary | ICD-10-CM | POA: Diagnosis not present

## 2023-09-11 DIAGNOSIS — F419 Anxiety disorder, unspecified: Secondary | ICD-10-CM | POA: Diagnosis not present

## 2023-09-11 DIAGNOSIS — I132 Hypertensive heart and chronic kidney disease with heart failure and with stage 5 chronic kidney disease, or end stage renal disease: Secondary | ICD-10-CM | POA: Diagnosis not present

## 2023-09-11 DIAGNOSIS — Z7984 Long term (current) use of oral hypoglycemic drugs: Secondary | ICD-10-CM | POA: Diagnosis not present

## 2023-09-13 DIAGNOSIS — N186 End stage renal disease: Secondary | ICD-10-CM | POA: Diagnosis not present

## 2023-09-13 DIAGNOSIS — N250 Renal osteodystrophy: Secondary | ICD-10-CM | POA: Diagnosis not present

## 2023-09-13 DIAGNOSIS — D509 Iron deficiency anemia, unspecified: Secondary | ICD-10-CM | POA: Diagnosis not present

## 2023-09-13 DIAGNOSIS — D631 Anemia in chronic kidney disease: Secondary | ICD-10-CM | POA: Diagnosis not present

## 2023-09-13 DIAGNOSIS — Z992 Dependence on renal dialysis: Secondary | ICD-10-CM | POA: Diagnosis not present

## 2023-09-15 DIAGNOSIS — D509 Iron deficiency anemia, unspecified: Secondary | ICD-10-CM | POA: Diagnosis not present

## 2023-09-15 DIAGNOSIS — D631 Anemia in chronic kidney disease: Secondary | ICD-10-CM | POA: Diagnosis not present

## 2023-09-15 DIAGNOSIS — N186 End stage renal disease: Secondary | ICD-10-CM | POA: Diagnosis not present

## 2023-09-15 DIAGNOSIS — N250 Renal osteodystrophy: Secondary | ICD-10-CM | POA: Diagnosis not present

## 2023-09-15 DIAGNOSIS — Z992 Dependence on renal dialysis: Secondary | ICD-10-CM | POA: Diagnosis not present

## 2023-09-18 DIAGNOSIS — J9621 Acute and chronic respiratory failure with hypoxia: Secondary | ICD-10-CM | POA: Diagnosis not present

## 2023-09-18 DIAGNOSIS — D631 Anemia in chronic kidney disease: Secondary | ICD-10-CM | POA: Diagnosis not present

## 2023-09-18 DIAGNOSIS — J9622 Acute and chronic respiratory failure with hypercapnia: Secondary | ICD-10-CM | POA: Diagnosis not present

## 2023-09-18 DIAGNOSIS — N250 Renal osteodystrophy: Secondary | ICD-10-CM | POA: Diagnosis not present

## 2023-09-18 DIAGNOSIS — R1310 Dysphagia, unspecified: Secondary | ICD-10-CM | POA: Diagnosis not present

## 2023-09-18 DIAGNOSIS — J441 Chronic obstructive pulmonary disease with (acute) exacerbation: Secondary | ICD-10-CM | POA: Diagnosis not present

## 2023-09-18 DIAGNOSIS — N186 End stage renal disease: Secondary | ICD-10-CM | POA: Diagnosis not present

## 2023-09-18 DIAGNOSIS — Z992 Dependence on renal dialysis: Secondary | ICD-10-CM | POA: Diagnosis not present

## 2023-09-18 DIAGNOSIS — D509 Iron deficiency anemia, unspecified: Secondary | ICD-10-CM | POA: Diagnosis not present

## 2023-09-20 DIAGNOSIS — N250 Renal osteodystrophy: Secondary | ICD-10-CM | POA: Diagnosis not present

## 2023-09-20 DIAGNOSIS — D631 Anemia in chronic kidney disease: Secondary | ICD-10-CM | POA: Diagnosis not present

## 2023-09-20 DIAGNOSIS — D509 Iron deficiency anemia, unspecified: Secondary | ICD-10-CM | POA: Diagnosis not present

## 2023-09-20 DIAGNOSIS — R1310 Dysphagia, unspecified: Secondary | ICD-10-CM | POA: Diagnosis not present

## 2023-09-20 DIAGNOSIS — J9622 Acute and chronic respiratory failure with hypercapnia: Secondary | ICD-10-CM | POA: Diagnosis not present

## 2023-09-20 DIAGNOSIS — Z992 Dependence on renal dialysis: Secondary | ICD-10-CM | POA: Diagnosis not present

## 2023-09-20 DIAGNOSIS — I5020 Unspecified systolic (congestive) heart failure: Secondary | ICD-10-CM | POA: Diagnosis not present

## 2023-09-20 DIAGNOSIS — J9621 Acute and chronic respiratory failure with hypoxia: Secondary | ICD-10-CM | POA: Diagnosis not present

## 2023-09-20 DIAGNOSIS — J441 Chronic obstructive pulmonary disease with (acute) exacerbation: Secondary | ICD-10-CM | POA: Diagnosis not present

## 2023-09-20 DIAGNOSIS — I132 Hypertensive heart and chronic kidney disease with heart failure and with stage 5 chronic kidney disease, or end stage renal disease: Secondary | ICD-10-CM | POA: Diagnosis not present

## 2023-09-20 DIAGNOSIS — N186 End stage renal disease: Secondary | ICD-10-CM | POA: Diagnosis not present

## 2023-09-22 DIAGNOSIS — D631 Anemia in chronic kidney disease: Secondary | ICD-10-CM | POA: Diagnosis not present

## 2023-09-22 DIAGNOSIS — Z992 Dependence on renal dialysis: Secondary | ICD-10-CM | POA: Diagnosis not present

## 2023-09-22 DIAGNOSIS — N186 End stage renal disease: Secondary | ICD-10-CM | POA: Diagnosis not present

## 2023-09-22 DIAGNOSIS — N250 Renal osteodystrophy: Secondary | ICD-10-CM | POA: Diagnosis not present

## 2023-09-22 DIAGNOSIS — D509 Iron deficiency anemia, unspecified: Secondary | ICD-10-CM | POA: Diagnosis not present

## 2023-09-24 DIAGNOSIS — J9621 Acute and chronic respiratory failure with hypoxia: Secondary | ICD-10-CM | POA: Diagnosis not present

## 2023-09-24 DIAGNOSIS — J441 Chronic obstructive pulmonary disease with (acute) exacerbation: Secondary | ICD-10-CM | POA: Diagnosis not present

## 2023-09-24 DIAGNOSIS — R1310 Dysphagia, unspecified: Secondary | ICD-10-CM | POA: Diagnosis not present

## 2023-09-24 DIAGNOSIS — I132 Hypertensive heart and chronic kidney disease with heart failure and with stage 5 chronic kidney disease, or end stage renal disease: Secondary | ICD-10-CM | POA: Diagnosis not present

## 2023-09-24 DIAGNOSIS — I5020 Unspecified systolic (congestive) heart failure: Secondary | ICD-10-CM | POA: Diagnosis not present

## 2023-09-24 DIAGNOSIS — J9622 Acute and chronic respiratory failure with hypercapnia: Secondary | ICD-10-CM | POA: Diagnosis not present

## 2023-09-25 DIAGNOSIS — Z992 Dependence on renal dialysis: Secondary | ICD-10-CM | POA: Diagnosis not present

## 2023-09-25 DIAGNOSIS — N186 End stage renal disease: Secondary | ICD-10-CM | POA: Diagnosis not present

## 2023-09-25 DIAGNOSIS — D631 Anemia in chronic kidney disease: Secondary | ICD-10-CM | POA: Diagnosis not present

## 2023-09-25 DIAGNOSIS — D509 Iron deficiency anemia, unspecified: Secondary | ICD-10-CM | POA: Diagnosis not present

## 2023-09-25 DIAGNOSIS — N250 Renal osteodystrophy: Secondary | ICD-10-CM | POA: Diagnosis not present

## 2023-09-27 DIAGNOSIS — Z992 Dependence on renal dialysis: Secondary | ICD-10-CM | POA: Diagnosis not present

## 2023-09-27 DIAGNOSIS — N186 End stage renal disease: Secondary | ICD-10-CM | POA: Diagnosis not present

## 2023-09-27 DIAGNOSIS — N250 Renal osteodystrophy: Secondary | ICD-10-CM | POA: Diagnosis not present

## 2023-09-27 DIAGNOSIS — D631 Anemia in chronic kidney disease: Secondary | ICD-10-CM | POA: Diagnosis not present

## 2023-09-27 DIAGNOSIS — D509 Iron deficiency anemia, unspecified: Secondary | ICD-10-CM | POA: Diagnosis not present

## 2023-09-29 DIAGNOSIS — N250 Renal osteodystrophy: Secondary | ICD-10-CM | POA: Diagnosis not present

## 2023-09-29 DIAGNOSIS — D509 Iron deficiency anemia, unspecified: Secondary | ICD-10-CM | POA: Diagnosis not present

## 2023-09-29 DIAGNOSIS — Z992 Dependence on renal dialysis: Secondary | ICD-10-CM | POA: Diagnosis not present

## 2023-09-29 DIAGNOSIS — N186 End stage renal disease: Secondary | ICD-10-CM | POA: Diagnosis not present

## 2023-09-29 DIAGNOSIS — D631 Anemia in chronic kidney disease: Secondary | ICD-10-CM | POA: Diagnosis not present

## 2023-10-02 DIAGNOSIS — D509 Iron deficiency anemia, unspecified: Secondary | ICD-10-CM | POA: Diagnosis not present

## 2023-10-02 DIAGNOSIS — D631 Anemia in chronic kidney disease: Secondary | ICD-10-CM | POA: Diagnosis not present

## 2023-10-02 DIAGNOSIS — Z992 Dependence on renal dialysis: Secondary | ICD-10-CM | POA: Diagnosis not present

## 2023-10-02 DIAGNOSIS — N250 Renal osteodystrophy: Secondary | ICD-10-CM | POA: Diagnosis not present

## 2023-10-02 DIAGNOSIS — N186 End stage renal disease: Secondary | ICD-10-CM | POA: Diagnosis not present

## 2023-10-03 DIAGNOSIS — T82591A Other mechanical complication of surgically created arteriovenous shunt, initial encounter: Secondary | ICD-10-CM | POA: Diagnosis not present

## 2023-10-03 DIAGNOSIS — T82858A Stenosis of vascular prosthetic devices, implants and grafts, initial encounter: Secondary | ICD-10-CM | POA: Diagnosis not present

## 2023-10-03 DIAGNOSIS — N186 End stage renal disease: Secondary | ICD-10-CM | POA: Diagnosis not present

## 2023-10-03 DIAGNOSIS — T82590A Other mechanical complication of surgically created arteriovenous fistula, initial encounter: Secondary | ICD-10-CM | POA: Diagnosis not present

## 2023-10-03 DIAGNOSIS — I871 Compression of vein: Secondary | ICD-10-CM | POA: Diagnosis not present

## 2023-10-04 DIAGNOSIS — Z992 Dependence on renal dialysis: Secondary | ICD-10-CM | POA: Diagnosis not present

## 2023-10-04 DIAGNOSIS — N250 Renal osteodystrophy: Secondary | ICD-10-CM | POA: Diagnosis not present

## 2023-10-04 DIAGNOSIS — D509 Iron deficiency anemia, unspecified: Secondary | ICD-10-CM | POA: Diagnosis not present

## 2023-10-04 DIAGNOSIS — N186 End stage renal disease: Secondary | ICD-10-CM | POA: Diagnosis not present

## 2023-10-04 DIAGNOSIS — D631 Anemia in chronic kidney disease: Secondary | ICD-10-CM | POA: Diagnosis not present

## 2023-10-06 DIAGNOSIS — N186 End stage renal disease: Secondary | ICD-10-CM | POA: Diagnosis not present

## 2023-10-06 DIAGNOSIS — D631 Anemia in chronic kidney disease: Secondary | ICD-10-CM | POA: Diagnosis not present

## 2023-10-06 DIAGNOSIS — D509 Iron deficiency anemia, unspecified: Secondary | ICD-10-CM | POA: Diagnosis not present

## 2023-10-06 DIAGNOSIS — Z992 Dependence on renal dialysis: Secondary | ICD-10-CM | POA: Diagnosis not present

## 2023-10-06 DIAGNOSIS — N250 Renal osteodystrophy: Secondary | ICD-10-CM | POA: Diagnosis not present

## 2023-10-07 DIAGNOSIS — T424X5A Adverse effect of benzodiazepines, initial encounter: Secondary | ICD-10-CM | POA: Diagnosis not present

## 2023-10-07 DIAGNOSIS — Z1152 Encounter for screening for COVID-19: Secondary | ICD-10-CM | POA: Diagnosis not present

## 2023-10-07 DIAGNOSIS — Z955 Presence of coronary angioplasty implant and graft: Secondary | ICD-10-CM | POA: Diagnosis not present

## 2023-10-07 DIAGNOSIS — J984 Other disorders of lung: Secondary | ICD-10-CM | POA: Diagnosis not present

## 2023-10-07 DIAGNOSIS — E876 Hypokalemia: Secondary | ICD-10-CM | POA: Diagnosis not present

## 2023-10-07 DIAGNOSIS — I509 Heart failure, unspecified: Secondary | ICD-10-CM | POA: Diagnosis not present

## 2023-10-07 DIAGNOSIS — R001 Bradycardia, unspecified: Secondary | ICD-10-CM | POA: Diagnosis not present

## 2023-10-07 DIAGNOSIS — D631 Anemia in chronic kidney disease: Secondary | ICD-10-CM | POA: Diagnosis not present

## 2023-10-07 DIAGNOSIS — I132 Hypertensive heart and chronic kidney disease with heart failure and with stage 5 chronic kidney disease, or end stage renal disease: Secondary | ICD-10-CM | POA: Diagnosis not present

## 2023-10-07 DIAGNOSIS — Z7982 Long term (current) use of aspirin: Secondary | ICD-10-CM | POA: Diagnosis not present

## 2023-10-07 DIAGNOSIS — E161 Other hypoglycemia: Secondary | ICD-10-CM | POA: Diagnosis not present

## 2023-10-07 DIAGNOSIS — Z20822 Contact with and (suspected) exposure to covid-19: Secondary | ICD-10-CM | POA: Diagnosis not present

## 2023-10-07 DIAGNOSIS — Z992 Dependence on renal dialysis: Secondary | ICD-10-CM | POA: Diagnosis not present

## 2023-10-07 DIAGNOSIS — J440 Chronic obstructive pulmonary disease with acute lower respiratory infection: Secondary | ICD-10-CM | POA: Diagnosis not present

## 2023-10-07 DIAGNOSIS — Z7984 Long term (current) use of oral hypoglycemic drugs: Secondary | ICD-10-CM | POA: Diagnosis not present

## 2023-10-07 DIAGNOSIS — I2510 Atherosclerotic heart disease of native coronary artery without angina pectoris: Secondary | ICD-10-CM | POA: Diagnosis not present

## 2023-10-07 DIAGNOSIS — F331 Major depressive disorder, recurrent, moderate: Secondary | ICD-10-CM | POA: Diagnosis not present

## 2023-10-07 DIAGNOSIS — I10 Essential (primary) hypertension: Secondary | ICD-10-CM | POA: Diagnosis not present

## 2023-10-07 DIAGNOSIS — N186 End stage renal disease: Secondary | ICD-10-CM | POA: Diagnosis not present

## 2023-10-07 DIAGNOSIS — J9601 Acute respiratory failure with hypoxia: Secondary | ICD-10-CM | POA: Diagnosis not present

## 2023-10-07 DIAGNOSIS — T428X5A Adverse effect of antiparkinsonism drugs and other central muscle-tone depressants, initial encounter: Secondary | ICD-10-CM | POA: Diagnosis not present

## 2023-10-07 DIAGNOSIS — J9602 Acute respiratory failure with hypercapnia: Secondary | ICD-10-CM | POA: Diagnosis not present

## 2023-10-07 DIAGNOSIS — G928 Other toxic encephalopathy: Secondary | ICD-10-CM | POA: Diagnosis not present

## 2023-10-07 DIAGNOSIS — I5042 Chronic combined systolic (congestive) and diastolic (congestive) heart failure: Secondary | ICD-10-CM | POA: Diagnosis not present

## 2023-10-07 DIAGNOSIS — E1122 Type 2 diabetes mellitus with diabetic chronic kidney disease: Secondary | ICD-10-CM | POA: Diagnosis not present

## 2023-10-07 DIAGNOSIS — J449 Chronic obstructive pulmonary disease, unspecified: Secondary | ICD-10-CM | POA: Diagnosis not present

## 2023-10-07 DIAGNOSIS — J690 Pneumonitis due to inhalation of food and vomit: Secondary | ICD-10-CM | POA: Diagnosis not present

## 2023-10-07 DIAGNOSIS — I639 Cerebral infarction, unspecified: Secondary | ICD-10-CM | POA: Diagnosis not present

## 2023-10-07 DIAGNOSIS — Z7902 Long term (current) use of antithrombotics/antiplatelets: Secondary | ICD-10-CM | POA: Diagnosis not present

## 2023-10-07 DIAGNOSIS — I952 Hypotension due to drugs: Secondary | ICD-10-CM | POA: Diagnosis not present

## 2023-10-07 DIAGNOSIS — A419 Sepsis, unspecified organism: Secondary | ICD-10-CM | POA: Diagnosis not present

## 2023-10-07 DIAGNOSIS — J168 Pneumonia due to other specified infectious organisms: Secondary | ICD-10-CM | POA: Diagnosis not present

## 2023-10-07 DIAGNOSIS — R4182 Altered mental status, unspecified: Secondary | ICD-10-CM | POA: Diagnosis not present

## 2023-10-07 DIAGNOSIS — Z880 Allergy status to penicillin: Secondary | ICD-10-CM | POA: Diagnosis not present

## 2023-10-07 DIAGNOSIS — K219 Gastro-esophageal reflux disease without esophagitis: Secondary | ICD-10-CM | POA: Diagnosis not present

## 2023-10-07 DIAGNOSIS — Z01810 Encounter for preprocedural cardiovascular examination: Secondary | ICD-10-CM | POA: Diagnosis not present

## 2023-10-07 DIAGNOSIS — R0902 Hypoxemia: Secondary | ICD-10-CM | POA: Diagnosis not present

## 2023-10-07 DIAGNOSIS — F418 Other specified anxiety disorders: Secondary | ICD-10-CM | POA: Diagnosis not present

## 2023-10-07 DIAGNOSIS — R509 Fever, unspecified: Secondary | ICD-10-CM | POA: Diagnosis not present

## 2023-10-07 DIAGNOSIS — N390 Urinary tract infection, site not specified: Secondary | ICD-10-CM | POA: Diagnosis not present

## 2023-10-07 DIAGNOSIS — J189 Pneumonia, unspecified organism: Secondary | ICD-10-CM | POA: Diagnosis not present

## 2023-10-07 DIAGNOSIS — I959 Hypotension, unspecified: Secondary | ICD-10-CM | POA: Diagnosis not present

## 2023-10-09 DIAGNOSIS — N186 End stage renal disease: Secondary | ICD-10-CM | POA: Diagnosis not present

## 2023-10-13 DIAGNOSIS — Z7984 Long term (current) use of oral hypoglycemic drugs: Secondary | ICD-10-CM | POA: Diagnosis not present

## 2023-10-13 DIAGNOSIS — T50905D Adverse effect of unspecified drugs, medicaments and biological substances, subsequent encounter: Secondary | ICD-10-CM | POA: Diagnosis not present

## 2023-10-13 DIAGNOSIS — I2510 Atherosclerotic heart disease of native coronary artery without angina pectoris: Secondary | ICD-10-CM | POA: Diagnosis not present

## 2023-10-13 DIAGNOSIS — D631 Anemia in chronic kidney disease: Secondary | ICD-10-CM | POA: Diagnosis not present

## 2023-10-13 DIAGNOSIS — D509 Iron deficiency anemia, unspecified: Secondary | ICD-10-CM | POA: Diagnosis not present

## 2023-10-13 DIAGNOSIS — Z9981 Dependence on supplemental oxygen: Secondary | ICD-10-CM | POA: Diagnosis not present

## 2023-10-13 DIAGNOSIS — J440 Chronic obstructive pulmonary disease with acute lower respiratory infection: Secondary | ICD-10-CM | POA: Diagnosis not present

## 2023-10-13 DIAGNOSIS — G928 Other toxic encephalopathy: Secondary | ICD-10-CM | POA: Diagnosis not present

## 2023-10-13 DIAGNOSIS — I132 Hypertensive heart and chronic kidney disease with heart failure and with stage 5 chronic kidney disease, or end stage renal disease: Secondary | ICD-10-CM | POA: Diagnosis not present

## 2023-10-13 DIAGNOSIS — E876 Hypokalemia: Secondary | ICD-10-CM | POA: Diagnosis not present

## 2023-10-13 DIAGNOSIS — Z992 Dependence on renal dialysis: Secondary | ICD-10-CM | POA: Diagnosis not present

## 2023-10-13 DIAGNOSIS — Z7902 Long term (current) use of antithrombotics/antiplatelets: Secondary | ICD-10-CM | POA: Diagnosis not present

## 2023-10-13 DIAGNOSIS — J9621 Acute and chronic respiratory failure with hypoxia: Secondary | ICD-10-CM | POA: Diagnosis not present

## 2023-10-13 DIAGNOSIS — J181 Lobar pneumonia, unspecified organism: Secondary | ICD-10-CM | POA: Diagnosis not present

## 2023-10-13 DIAGNOSIS — K219 Gastro-esophageal reflux disease without esophagitis: Secondary | ICD-10-CM | POA: Diagnosis not present

## 2023-10-13 DIAGNOSIS — F419 Anxiety disorder, unspecified: Secondary | ICD-10-CM | POA: Diagnosis not present

## 2023-10-13 DIAGNOSIS — N250 Renal osteodystrophy: Secondary | ICD-10-CM | POA: Diagnosis not present

## 2023-10-13 DIAGNOSIS — Z51A Encounter for sepsis aftercare: Secondary | ICD-10-CM | POA: Diagnosis not present

## 2023-10-13 DIAGNOSIS — F32A Depression, unspecified: Secondary | ICD-10-CM | POA: Diagnosis not present

## 2023-10-13 DIAGNOSIS — N186 End stage renal disease: Secondary | ICD-10-CM | POA: Diagnosis not present

## 2023-10-13 DIAGNOSIS — I5042 Chronic combined systolic (congestive) and diastolic (congestive) heart failure: Secondary | ICD-10-CM | POA: Diagnosis not present

## 2023-10-13 DIAGNOSIS — J9622 Acute and chronic respiratory failure with hypercapnia: Secondary | ICD-10-CM | POA: Diagnosis not present

## 2023-10-13 DIAGNOSIS — Z955 Presence of coronary angioplasty implant and graft: Secondary | ICD-10-CM | POA: Diagnosis not present

## 2023-10-13 DIAGNOSIS — E1122 Type 2 diabetes mellitus with diabetic chronic kidney disease: Secondary | ICD-10-CM | POA: Diagnosis not present

## 2023-10-13 DIAGNOSIS — Z792 Long term (current) use of antibiotics: Secondary | ICD-10-CM | POA: Diagnosis not present

## 2023-10-15 DIAGNOSIS — E1122 Type 2 diabetes mellitus with diabetic chronic kidney disease: Secondary | ICD-10-CM | POA: Diagnosis not present

## 2023-10-15 DIAGNOSIS — Z992 Dependence on renal dialysis: Secondary | ICD-10-CM | POA: Diagnosis not present

## 2023-10-15 DIAGNOSIS — E875 Hyperkalemia: Secondary | ICD-10-CM | POA: Diagnosis not present

## 2023-10-15 DIAGNOSIS — I129 Hypertensive chronic kidney disease with stage 1 through stage 4 chronic kidney disease, or unspecified chronic kidney disease: Secondary | ICD-10-CM | POA: Diagnosis not present

## 2023-10-15 DIAGNOSIS — D638 Anemia in other chronic diseases classified elsewhere: Secondary | ICD-10-CM | POA: Diagnosis not present

## 2023-10-15 DIAGNOSIS — I5032 Chronic diastolic (congestive) heart failure: Secondary | ICD-10-CM | POA: Diagnosis not present

## 2023-10-15 DIAGNOSIS — F411 Generalized anxiety disorder: Secondary | ICD-10-CM | POA: Diagnosis not present

## 2023-10-15 DIAGNOSIS — N186 End stage renal disease: Secondary | ICD-10-CM | POA: Diagnosis not present

## 2023-10-16 DIAGNOSIS — N186 End stage renal disease: Secondary | ICD-10-CM | POA: Diagnosis not present

## 2023-10-16 DIAGNOSIS — N250 Renal osteodystrophy: Secondary | ICD-10-CM | POA: Diagnosis not present

## 2023-10-16 DIAGNOSIS — D631 Anemia in chronic kidney disease: Secondary | ICD-10-CM | POA: Diagnosis not present

## 2023-10-16 DIAGNOSIS — D509 Iron deficiency anemia, unspecified: Secondary | ICD-10-CM | POA: Diagnosis not present

## 2023-10-16 DIAGNOSIS — Z992 Dependence on renal dialysis: Secondary | ICD-10-CM | POA: Diagnosis not present

## 2023-10-17 DIAGNOSIS — G928 Other toxic encephalopathy: Secondary | ICD-10-CM | POA: Diagnosis not present

## 2023-10-17 DIAGNOSIS — J440 Chronic obstructive pulmonary disease with acute lower respiratory infection: Secondary | ICD-10-CM | POA: Diagnosis not present

## 2023-10-17 DIAGNOSIS — J9622 Acute and chronic respiratory failure with hypercapnia: Secondary | ICD-10-CM | POA: Diagnosis not present

## 2023-10-17 DIAGNOSIS — J9621 Acute and chronic respiratory failure with hypoxia: Secondary | ICD-10-CM | POA: Diagnosis not present

## 2023-10-17 DIAGNOSIS — J181 Lobar pneumonia, unspecified organism: Secondary | ICD-10-CM | POA: Diagnosis not present

## 2023-10-17 DIAGNOSIS — Z51A Encounter for sepsis aftercare: Secondary | ICD-10-CM | POA: Diagnosis not present

## 2023-10-18 DIAGNOSIS — N186 End stage renal disease: Secondary | ICD-10-CM | POA: Diagnosis not present

## 2023-10-18 DIAGNOSIS — Z992 Dependence on renal dialysis: Secondary | ICD-10-CM | POA: Diagnosis not present

## 2023-10-18 DIAGNOSIS — D631 Anemia in chronic kidney disease: Secondary | ICD-10-CM | POA: Diagnosis not present

## 2023-10-18 DIAGNOSIS — D509 Iron deficiency anemia, unspecified: Secondary | ICD-10-CM | POA: Diagnosis not present

## 2023-10-18 DIAGNOSIS — N250 Renal osteodystrophy: Secondary | ICD-10-CM | POA: Diagnosis not present

## 2023-10-19 DIAGNOSIS — J181 Lobar pneumonia, unspecified organism: Secondary | ICD-10-CM | POA: Diagnosis not present

## 2023-10-19 DIAGNOSIS — J9622 Acute and chronic respiratory failure with hypercapnia: Secondary | ICD-10-CM | POA: Diagnosis not present

## 2023-10-19 DIAGNOSIS — G928 Other toxic encephalopathy: Secondary | ICD-10-CM | POA: Diagnosis not present

## 2023-10-19 DIAGNOSIS — J440 Chronic obstructive pulmonary disease with acute lower respiratory infection: Secondary | ICD-10-CM | POA: Diagnosis not present

## 2023-10-19 DIAGNOSIS — Z51A Encounter for sepsis aftercare: Secondary | ICD-10-CM | POA: Diagnosis not present

## 2023-10-19 DIAGNOSIS — J9621 Acute and chronic respiratory failure with hypoxia: Secondary | ICD-10-CM | POA: Diagnosis not present

## 2023-10-20 DIAGNOSIS — Z992 Dependence on renal dialysis: Secondary | ICD-10-CM | POA: Diagnosis not present

## 2023-10-20 DIAGNOSIS — D509 Iron deficiency anemia, unspecified: Secondary | ICD-10-CM | POA: Diagnosis not present

## 2023-10-20 DIAGNOSIS — N250 Renal osteodystrophy: Secondary | ICD-10-CM | POA: Diagnosis not present

## 2023-10-20 DIAGNOSIS — D631 Anemia in chronic kidney disease: Secondary | ICD-10-CM | POA: Diagnosis not present

## 2023-10-20 DIAGNOSIS — N186 End stage renal disease: Secondary | ICD-10-CM | POA: Diagnosis not present

## 2023-10-23 DIAGNOSIS — J440 Chronic obstructive pulmonary disease with acute lower respiratory infection: Secondary | ICD-10-CM | POA: Diagnosis not present

## 2023-10-23 DIAGNOSIS — J9622 Acute and chronic respiratory failure with hypercapnia: Secondary | ICD-10-CM | POA: Diagnosis not present

## 2023-10-23 DIAGNOSIS — N186 End stage renal disease: Secondary | ICD-10-CM | POA: Diagnosis not present

## 2023-10-23 DIAGNOSIS — Z51A Encounter for sepsis aftercare: Secondary | ICD-10-CM | POA: Diagnosis not present

## 2023-10-23 DIAGNOSIS — D631 Anemia in chronic kidney disease: Secondary | ICD-10-CM | POA: Diagnosis not present

## 2023-10-23 DIAGNOSIS — N250 Renal osteodystrophy: Secondary | ICD-10-CM | POA: Diagnosis not present

## 2023-10-23 DIAGNOSIS — J181 Lobar pneumonia, unspecified organism: Secondary | ICD-10-CM | POA: Diagnosis not present

## 2023-10-23 DIAGNOSIS — J9621 Acute and chronic respiratory failure with hypoxia: Secondary | ICD-10-CM | POA: Diagnosis not present

## 2023-10-23 DIAGNOSIS — D509 Iron deficiency anemia, unspecified: Secondary | ICD-10-CM | POA: Diagnosis not present

## 2023-10-23 DIAGNOSIS — Z992 Dependence on renal dialysis: Secondary | ICD-10-CM | POA: Diagnosis not present

## 2023-10-23 DIAGNOSIS — G928 Other toxic encephalopathy: Secondary | ICD-10-CM | POA: Diagnosis not present

## 2023-10-25 DIAGNOSIS — D509 Iron deficiency anemia, unspecified: Secondary | ICD-10-CM | POA: Diagnosis not present

## 2023-10-25 DIAGNOSIS — N250 Renal osteodystrophy: Secondary | ICD-10-CM | POA: Diagnosis not present

## 2023-10-25 DIAGNOSIS — D631 Anemia in chronic kidney disease: Secondary | ICD-10-CM | POA: Diagnosis not present

## 2023-10-25 DIAGNOSIS — Z992 Dependence on renal dialysis: Secondary | ICD-10-CM | POA: Diagnosis not present

## 2023-10-25 DIAGNOSIS — N186 End stage renal disease: Secondary | ICD-10-CM | POA: Diagnosis not present

## 2023-10-27 DIAGNOSIS — N250 Renal osteodystrophy: Secondary | ICD-10-CM | POA: Diagnosis not present

## 2023-10-27 DIAGNOSIS — N186 End stage renal disease: Secondary | ICD-10-CM | POA: Diagnosis not present

## 2023-10-27 DIAGNOSIS — D631 Anemia in chronic kidney disease: Secondary | ICD-10-CM | POA: Diagnosis not present

## 2023-10-27 DIAGNOSIS — D509 Iron deficiency anemia, unspecified: Secondary | ICD-10-CM | POA: Diagnosis not present

## 2023-10-27 DIAGNOSIS — Z992 Dependence on renal dialysis: Secondary | ICD-10-CM | POA: Diagnosis not present

## 2023-10-30 DIAGNOSIS — N250 Renal osteodystrophy: Secondary | ICD-10-CM | POA: Diagnosis not present

## 2023-10-30 DIAGNOSIS — D631 Anemia in chronic kidney disease: Secondary | ICD-10-CM | POA: Diagnosis not present

## 2023-10-30 DIAGNOSIS — D509 Iron deficiency anemia, unspecified: Secondary | ICD-10-CM | POA: Diagnosis not present

## 2023-10-30 DIAGNOSIS — N186 End stage renal disease: Secondary | ICD-10-CM | POA: Diagnosis not present

## 2023-10-30 DIAGNOSIS — Z992 Dependence on renal dialysis: Secondary | ICD-10-CM | POA: Diagnosis not present

## 2023-10-31 DIAGNOSIS — J181 Lobar pneumonia, unspecified organism: Secondary | ICD-10-CM | POA: Diagnosis not present

## 2023-10-31 DIAGNOSIS — J440 Chronic obstructive pulmonary disease with acute lower respiratory infection: Secondary | ICD-10-CM | POA: Diagnosis not present

## 2023-10-31 DIAGNOSIS — G928 Other toxic encephalopathy: Secondary | ICD-10-CM | POA: Diagnosis not present

## 2023-10-31 DIAGNOSIS — Z51A Encounter for sepsis aftercare: Secondary | ICD-10-CM | POA: Diagnosis not present

## 2023-10-31 DIAGNOSIS — J9621 Acute and chronic respiratory failure with hypoxia: Secondary | ICD-10-CM | POA: Diagnosis not present

## 2023-10-31 DIAGNOSIS — J9622 Acute and chronic respiratory failure with hypercapnia: Secondary | ICD-10-CM | POA: Diagnosis not present

## 2023-11-01 DIAGNOSIS — I871 Compression of vein: Secondary | ICD-10-CM | POA: Diagnosis not present

## 2023-11-01 DIAGNOSIS — N250 Renal osteodystrophy: Secondary | ICD-10-CM | POA: Diagnosis not present

## 2023-11-01 DIAGNOSIS — I771 Stricture of artery: Secondary | ICD-10-CM | POA: Diagnosis not present

## 2023-11-01 DIAGNOSIS — D509 Iron deficiency anemia, unspecified: Secondary | ICD-10-CM | POA: Diagnosis not present

## 2023-11-01 DIAGNOSIS — N186 End stage renal disease: Secondary | ICD-10-CM | POA: Diagnosis not present

## 2023-11-01 DIAGNOSIS — T82591A Other mechanical complication of surgically created arteriovenous shunt, initial encounter: Secondary | ICD-10-CM | POA: Diagnosis not present

## 2023-11-01 DIAGNOSIS — D631 Anemia in chronic kidney disease: Secondary | ICD-10-CM | POA: Diagnosis not present

## 2023-11-01 DIAGNOSIS — T82590A Other mechanical complication of surgically created arteriovenous fistula, initial encounter: Secondary | ICD-10-CM | POA: Diagnosis not present

## 2023-11-01 DIAGNOSIS — Z992 Dependence on renal dialysis: Secondary | ICD-10-CM | POA: Diagnosis not present

## 2023-11-01 DIAGNOSIS — T82858A Stenosis of vascular prosthetic devices, implants and grafts, initial encounter: Secondary | ICD-10-CM | POA: Diagnosis not present

## 2023-11-03 DIAGNOSIS — N186 End stage renal disease: Secondary | ICD-10-CM | POA: Diagnosis not present

## 2023-11-03 DIAGNOSIS — D509 Iron deficiency anemia, unspecified: Secondary | ICD-10-CM | POA: Diagnosis not present

## 2023-11-03 DIAGNOSIS — Z992 Dependence on renal dialysis: Secondary | ICD-10-CM | POA: Diagnosis not present

## 2023-11-03 DIAGNOSIS — N250 Renal osteodystrophy: Secondary | ICD-10-CM | POA: Diagnosis not present

## 2023-11-03 DIAGNOSIS — D631 Anemia in chronic kidney disease: Secondary | ICD-10-CM | POA: Diagnosis not present

## 2023-11-05 DIAGNOSIS — L218 Other seborrheic dermatitis: Secondary | ICD-10-CM | POA: Diagnosis not present

## 2023-11-05 DIAGNOSIS — L570 Actinic keratosis: Secondary | ICD-10-CM | POA: Diagnosis not present

## 2023-11-05 DIAGNOSIS — L814 Other melanin hyperpigmentation: Secondary | ICD-10-CM | POA: Diagnosis not present

## 2023-11-06 DIAGNOSIS — Z992 Dependence on renal dialysis: Secondary | ICD-10-CM | POA: Diagnosis not present

## 2023-11-06 DIAGNOSIS — D631 Anemia in chronic kidney disease: Secondary | ICD-10-CM | POA: Diagnosis not present

## 2023-11-06 DIAGNOSIS — J9622 Acute and chronic respiratory failure with hypercapnia: Secondary | ICD-10-CM | POA: Diagnosis not present

## 2023-11-06 DIAGNOSIS — Z51A Encounter for sepsis aftercare: Secondary | ICD-10-CM | POA: Diagnosis not present

## 2023-11-06 DIAGNOSIS — G928 Other toxic encephalopathy: Secondary | ICD-10-CM | POA: Diagnosis not present

## 2023-11-06 DIAGNOSIS — J181 Lobar pneumonia, unspecified organism: Secondary | ICD-10-CM | POA: Diagnosis not present

## 2023-11-06 DIAGNOSIS — J9621 Acute and chronic respiratory failure with hypoxia: Secondary | ICD-10-CM | POA: Diagnosis not present

## 2023-11-06 DIAGNOSIS — J9611 Chronic respiratory failure with hypoxia: Secondary | ICD-10-CM | POA: Diagnosis not present

## 2023-11-06 DIAGNOSIS — N250 Renal osteodystrophy: Secondary | ICD-10-CM | POA: Diagnosis not present

## 2023-11-06 DIAGNOSIS — D509 Iron deficiency anemia, unspecified: Secondary | ICD-10-CM | POA: Diagnosis not present

## 2023-11-06 DIAGNOSIS — J309 Allergic rhinitis, unspecified: Secondary | ICD-10-CM | POA: Diagnosis not present

## 2023-11-06 DIAGNOSIS — N186 End stage renal disease: Secondary | ICD-10-CM | POA: Diagnosis not present

## 2023-11-06 DIAGNOSIS — J440 Chronic obstructive pulmonary disease with acute lower respiratory infection: Secondary | ICD-10-CM | POA: Diagnosis not present

## 2023-11-08 DIAGNOSIS — D509 Iron deficiency anemia, unspecified: Secondary | ICD-10-CM | POA: Diagnosis not present

## 2023-11-08 DIAGNOSIS — D631 Anemia in chronic kidney disease: Secondary | ICD-10-CM | POA: Diagnosis not present

## 2023-11-08 DIAGNOSIS — N250 Renal osteodystrophy: Secondary | ICD-10-CM | POA: Diagnosis not present

## 2023-11-08 DIAGNOSIS — N186 End stage renal disease: Secondary | ICD-10-CM | POA: Diagnosis not present

## 2023-11-08 DIAGNOSIS — Z992 Dependence on renal dialysis: Secondary | ICD-10-CM | POA: Diagnosis not present

## 2023-11-09 DIAGNOSIS — J181 Lobar pneumonia, unspecified organism: Secondary | ICD-10-CM | POA: Diagnosis not present

## 2023-11-09 DIAGNOSIS — J9621 Acute and chronic respiratory failure with hypoxia: Secondary | ICD-10-CM | POA: Diagnosis not present

## 2023-11-09 DIAGNOSIS — G928 Other toxic encephalopathy: Secondary | ICD-10-CM | POA: Diagnosis not present

## 2023-11-09 DIAGNOSIS — J440 Chronic obstructive pulmonary disease with acute lower respiratory infection: Secondary | ICD-10-CM | POA: Diagnosis not present

## 2023-11-09 DIAGNOSIS — J9622 Acute and chronic respiratory failure with hypercapnia: Secondary | ICD-10-CM | POA: Diagnosis not present

## 2023-11-09 DIAGNOSIS — Z51A Encounter for sepsis aftercare: Secondary | ICD-10-CM | POA: Diagnosis not present

## 2023-11-10 DIAGNOSIS — D509 Iron deficiency anemia, unspecified: Secondary | ICD-10-CM | POA: Diagnosis not present

## 2023-11-10 DIAGNOSIS — Z992 Dependence on renal dialysis: Secondary | ICD-10-CM | POA: Diagnosis not present

## 2023-11-10 DIAGNOSIS — N186 End stage renal disease: Secondary | ICD-10-CM | POA: Diagnosis not present

## 2023-11-10 DIAGNOSIS — N250 Renal osteodystrophy: Secondary | ICD-10-CM | POA: Diagnosis not present

## 2023-11-10 DIAGNOSIS — D631 Anemia in chronic kidney disease: Secondary | ICD-10-CM | POA: Diagnosis not present

## 2023-11-12 DIAGNOSIS — T50905D Adverse effect of unspecified drugs, medicaments and biological substances, subsequent encounter: Secondary | ICD-10-CM | POA: Diagnosis not present

## 2023-11-12 DIAGNOSIS — I2510 Atherosclerotic heart disease of native coronary artery without angina pectoris: Secondary | ICD-10-CM | POA: Diagnosis not present

## 2023-11-12 DIAGNOSIS — D631 Anemia in chronic kidney disease: Secondary | ICD-10-CM | POA: Diagnosis not present

## 2023-11-12 DIAGNOSIS — Z7902 Long term (current) use of antithrombotics/antiplatelets: Secondary | ICD-10-CM | POA: Diagnosis not present

## 2023-11-12 DIAGNOSIS — Z955 Presence of coronary angioplasty implant and graft: Secondary | ICD-10-CM | POA: Diagnosis not present

## 2023-11-12 DIAGNOSIS — E1140 Type 2 diabetes mellitus with diabetic neuropathy, unspecified: Secondary | ICD-10-CM | POA: Diagnosis not present

## 2023-11-12 DIAGNOSIS — F32A Depression, unspecified: Secondary | ICD-10-CM | POA: Diagnosis not present

## 2023-11-12 DIAGNOSIS — K219 Gastro-esophageal reflux disease without esophagitis: Secondary | ICD-10-CM | POA: Diagnosis not present

## 2023-11-12 DIAGNOSIS — Z992 Dependence on renal dialysis: Secondary | ICD-10-CM | POA: Diagnosis not present

## 2023-11-12 DIAGNOSIS — E1122 Type 2 diabetes mellitus with diabetic chronic kidney disease: Secondary | ICD-10-CM | POA: Diagnosis not present

## 2023-11-12 DIAGNOSIS — Z7984 Long term (current) use of oral hypoglycemic drugs: Secondary | ICD-10-CM | POA: Diagnosis not present

## 2023-11-12 DIAGNOSIS — Z9981 Dependence on supplemental oxygen: Secondary | ICD-10-CM | POA: Diagnosis not present

## 2023-11-12 DIAGNOSIS — N186 End stage renal disease: Secondary | ICD-10-CM | POA: Diagnosis not present

## 2023-11-12 DIAGNOSIS — J440 Chronic obstructive pulmonary disease with acute lower respiratory infection: Secondary | ICD-10-CM | POA: Diagnosis not present

## 2023-11-12 DIAGNOSIS — J181 Lobar pneumonia, unspecified organism: Secondary | ICD-10-CM | POA: Diagnosis not present

## 2023-11-12 DIAGNOSIS — J9622 Acute and chronic respiratory failure with hypercapnia: Secondary | ICD-10-CM | POA: Diagnosis not present

## 2023-11-12 DIAGNOSIS — B351 Tinea unguium: Secondary | ICD-10-CM | POA: Diagnosis not present

## 2023-11-12 DIAGNOSIS — I132 Hypertensive heart and chronic kidney disease with heart failure and with stage 5 chronic kidney disease, or end stage renal disease: Secondary | ICD-10-CM | POA: Diagnosis not present

## 2023-11-12 DIAGNOSIS — F419 Anxiety disorder, unspecified: Secondary | ICD-10-CM | POA: Diagnosis not present

## 2023-11-12 DIAGNOSIS — I5042 Chronic combined systolic (congestive) and diastolic (congestive) heart failure: Secondary | ICD-10-CM | POA: Diagnosis not present

## 2023-11-12 DIAGNOSIS — Z51A Encounter for sepsis aftercare: Secondary | ICD-10-CM | POA: Diagnosis not present

## 2023-11-12 DIAGNOSIS — E876 Hypokalemia: Secondary | ICD-10-CM | POA: Diagnosis not present

## 2023-11-12 DIAGNOSIS — J9621 Acute and chronic respiratory failure with hypoxia: Secondary | ICD-10-CM | POA: Diagnosis not present

## 2023-11-12 DIAGNOSIS — Z792 Long term (current) use of antibiotics: Secondary | ICD-10-CM | POA: Diagnosis not present

## 2023-11-12 DIAGNOSIS — G928 Other toxic encephalopathy: Secondary | ICD-10-CM | POA: Diagnosis not present

## 2023-11-13 DIAGNOSIS — D631 Anemia in chronic kidney disease: Secondary | ICD-10-CM | POA: Diagnosis not present

## 2023-11-13 DIAGNOSIS — J9622 Acute and chronic respiratory failure with hypercapnia: Secondary | ICD-10-CM | POA: Diagnosis not present

## 2023-11-13 DIAGNOSIS — G928 Other toxic encephalopathy: Secondary | ICD-10-CM | POA: Diagnosis not present

## 2023-11-13 DIAGNOSIS — Z992 Dependence on renal dialysis: Secondary | ICD-10-CM | POA: Diagnosis not present

## 2023-11-13 DIAGNOSIS — Z51A Encounter for sepsis aftercare: Secondary | ICD-10-CM | POA: Diagnosis not present

## 2023-11-13 DIAGNOSIS — J181 Lobar pneumonia, unspecified organism: Secondary | ICD-10-CM | POA: Diagnosis not present

## 2023-11-13 DIAGNOSIS — N250 Renal osteodystrophy: Secondary | ICD-10-CM | POA: Diagnosis not present

## 2023-11-13 DIAGNOSIS — J440 Chronic obstructive pulmonary disease with acute lower respiratory infection: Secondary | ICD-10-CM | POA: Diagnosis not present

## 2023-11-13 DIAGNOSIS — J9621 Acute and chronic respiratory failure with hypoxia: Secondary | ICD-10-CM | POA: Diagnosis not present

## 2023-11-13 DIAGNOSIS — D509 Iron deficiency anemia, unspecified: Secondary | ICD-10-CM | POA: Diagnosis not present

## 2023-11-13 DIAGNOSIS — N186 End stage renal disease: Secondary | ICD-10-CM | POA: Diagnosis not present

## 2023-11-15 DIAGNOSIS — Z51A Encounter for sepsis aftercare: Secondary | ICD-10-CM | POA: Diagnosis not present

## 2023-11-15 DIAGNOSIS — N186 End stage renal disease: Secondary | ICD-10-CM | POA: Diagnosis not present

## 2023-11-15 DIAGNOSIS — Z992 Dependence on renal dialysis: Secondary | ICD-10-CM | POA: Diagnosis not present

## 2023-11-15 DIAGNOSIS — G928 Other toxic encephalopathy: Secondary | ICD-10-CM | POA: Diagnosis not present

## 2023-11-15 DIAGNOSIS — D631 Anemia in chronic kidney disease: Secondary | ICD-10-CM | POA: Diagnosis not present

## 2023-11-15 DIAGNOSIS — D509 Iron deficiency anemia, unspecified: Secondary | ICD-10-CM | POA: Diagnosis not present

## 2023-11-15 DIAGNOSIS — N250 Renal osteodystrophy: Secondary | ICD-10-CM | POA: Diagnosis not present

## 2023-11-15 DIAGNOSIS — J181 Lobar pneumonia, unspecified organism: Secondary | ICD-10-CM | POA: Diagnosis not present

## 2023-11-15 DIAGNOSIS — J9622 Acute and chronic respiratory failure with hypercapnia: Secondary | ICD-10-CM | POA: Diagnosis not present

## 2023-11-15 DIAGNOSIS — J9621 Acute and chronic respiratory failure with hypoxia: Secondary | ICD-10-CM | POA: Diagnosis not present

## 2023-11-15 DIAGNOSIS — J440 Chronic obstructive pulmonary disease with acute lower respiratory infection: Secondary | ICD-10-CM | POA: Diagnosis not present

## 2023-11-17 DIAGNOSIS — N250 Renal osteodystrophy: Secondary | ICD-10-CM | POA: Diagnosis not present

## 2023-11-17 DIAGNOSIS — D631 Anemia in chronic kidney disease: Secondary | ICD-10-CM | POA: Diagnosis not present

## 2023-11-17 DIAGNOSIS — Z992 Dependence on renal dialysis: Secondary | ICD-10-CM | POA: Diagnosis not present

## 2023-11-17 DIAGNOSIS — D509 Iron deficiency anemia, unspecified: Secondary | ICD-10-CM | POA: Diagnosis not present

## 2023-11-17 DIAGNOSIS — N186 End stage renal disease: Secondary | ICD-10-CM | POA: Diagnosis not present

## 2023-11-20 DIAGNOSIS — Z992 Dependence on renal dialysis: Secondary | ICD-10-CM | POA: Diagnosis not present

## 2023-11-20 DIAGNOSIS — J181 Lobar pneumonia, unspecified organism: Secondary | ICD-10-CM | POA: Diagnosis not present

## 2023-11-20 DIAGNOSIS — Z51A Encounter for sepsis aftercare: Secondary | ICD-10-CM | POA: Diagnosis not present

## 2023-11-20 DIAGNOSIS — G928 Other toxic encephalopathy: Secondary | ICD-10-CM | POA: Diagnosis not present

## 2023-11-20 DIAGNOSIS — J9622 Acute and chronic respiratory failure with hypercapnia: Secondary | ICD-10-CM | POA: Diagnosis not present

## 2023-11-20 DIAGNOSIS — D631 Anemia in chronic kidney disease: Secondary | ICD-10-CM | POA: Diagnosis not present

## 2023-11-20 DIAGNOSIS — J9621 Acute and chronic respiratory failure with hypoxia: Secondary | ICD-10-CM | POA: Diagnosis not present

## 2023-11-20 DIAGNOSIS — N250 Renal osteodystrophy: Secondary | ICD-10-CM | POA: Diagnosis not present

## 2023-11-20 DIAGNOSIS — J440 Chronic obstructive pulmonary disease with acute lower respiratory infection: Secondary | ICD-10-CM | POA: Diagnosis not present

## 2023-11-20 DIAGNOSIS — N186 End stage renal disease: Secondary | ICD-10-CM | POA: Diagnosis not present

## 2023-11-20 DIAGNOSIS — D509 Iron deficiency anemia, unspecified: Secondary | ICD-10-CM | POA: Diagnosis not present

## 2023-11-22 DIAGNOSIS — Z992 Dependence on renal dialysis: Secondary | ICD-10-CM | POA: Diagnosis not present

## 2023-11-22 DIAGNOSIS — N186 End stage renal disease: Secondary | ICD-10-CM | POA: Diagnosis not present

## 2023-11-22 DIAGNOSIS — D631 Anemia in chronic kidney disease: Secondary | ICD-10-CM | POA: Diagnosis not present

## 2023-11-22 DIAGNOSIS — N250 Renal osteodystrophy: Secondary | ICD-10-CM | POA: Diagnosis not present

## 2023-11-22 DIAGNOSIS — D509 Iron deficiency anemia, unspecified: Secondary | ICD-10-CM | POA: Diagnosis not present

## 2023-11-24 DIAGNOSIS — D631 Anemia in chronic kidney disease: Secondary | ICD-10-CM | POA: Diagnosis not present

## 2023-11-24 DIAGNOSIS — D509 Iron deficiency anemia, unspecified: Secondary | ICD-10-CM | POA: Diagnosis not present

## 2023-11-24 DIAGNOSIS — N250 Renal osteodystrophy: Secondary | ICD-10-CM | POA: Diagnosis not present

## 2023-11-24 DIAGNOSIS — Z992 Dependence on renal dialysis: Secondary | ICD-10-CM | POA: Diagnosis not present

## 2023-11-24 DIAGNOSIS — N186 End stage renal disease: Secondary | ICD-10-CM | POA: Diagnosis not present

## 2023-11-26 DIAGNOSIS — J440 Chronic obstructive pulmonary disease with acute lower respiratory infection: Secondary | ICD-10-CM | POA: Diagnosis not present

## 2023-11-26 DIAGNOSIS — J9622 Acute and chronic respiratory failure with hypercapnia: Secondary | ICD-10-CM | POA: Diagnosis not present

## 2023-11-26 DIAGNOSIS — Z51A Encounter for sepsis aftercare: Secondary | ICD-10-CM | POA: Diagnosis not present

## 2023-11-26 DIAGNOSIS — J9621 Acute and chronic respiratory failure with hypoxia: Secondary | ICD-10-CM | POA: Diagnosis not present

## 2023-11-26 DIAGNOSIS — J181 Lobar pneumonia, unspecified organism: Secondary | ICD-10-CM | POA: Diagnosis not present

## 2023-11-26 DIAGNOSIS — G928 Other toxic encephalopathy: Secondary | ICD-10-CM | POA: Diagnosis not present

## 2023-11-27 DIAGNOSIS — N186 End stage renal disease: Secondary | ICD-10-CM | POA: Diagnosis not present

## 2023-11-27 DIAGNOSIS — D631 Anemia in chronic kidney disease: Secondary | ICD-10-CM | POA: Diagnosis not present

## 2023-11-27 DIAGNOSIS — D509 Iron deficiency anemia, unspecified: Secondary | ICD-10-CM | POA: Diagnosis not present

## 2023-11-27 DIAGNOSIS — Z992 Dependence on renal dialysis: Secondary | ICD-10-CM | POA: Diagnosis not present

## 2023-11-27 DIAGNOSIS — N250 Renal osteodystrophy: Secondary | ICD-10-CM | POA: Diagnosis not present

## 2023-11-29 DIAGNOSIS — N250 Renal osteodystrophy: Secondary | ICD-10-CM | POA: Diagnosis not present

## 2023-11-29 DIAGNOSIS — D509 Iron deficiency anemia, unspecified: Secondary | ICD-10-CM | POA: Diagnosis not present

## 2023-11-29 DIAGNOSIS — N186 End stage renal disease: Secondary | ICD-10-CM | POA: Diagnosis not present

## 2023-11-29 DIAGNOSIS — Z992 Dependence on renal dialysis: Secondary | ICD-10-CM | POA: Diagnosis not present

## 2023-11-29 DIAGNOSIS — D631 Anemia in chronic kidney disease: Secondary | ICD-10-CM | POA: Diagnosis not present

## 2023-12-01 DIAGNOSIS — N250 Renal osteodystrophy: Secondary | ICD-10-CM | POA: Diagnosis not present

## 2023-12-01 DIAGNOSIS — Z992 Dependence on renal dialysis: Secondary | ICD-10-CM | POA: Diagnosis not present

## 2023-12-01 DIAGNOSIS — D509 Iron deficiency anemia, unspecified: Secondary | ICD-10-CM | POA: Diagnosis not present

## 2023-12-01 DIAGNOSIS — N186 End stage renal disease: Secondary | ICD-10-CM | POA: Diagnosis not present

## 2023-12-01 DIAGNOSIS — D631 Anemia in chronic kidney disease: Secondary | ICD-10-CM | POA: Diagnosis not present

## 2023-12-04 DIAGNOSIS — N250 Renal osteodystrophy: Secondary | ICD-10-CM | POA: Diagnosis not present

## 2023-12-04 DIAGNOSIS — N186 End stage renal disease: Secondary | ICD-10-CM | POA: Diagnosis not present

## 2023-12-04 DIAGNOSIS — D631 Anemia in chronic kidney disease: Secondary | ICD-10-CM | POA: Diagnosis not present

## 2023-12-04 DIAGNOSIS — Z992 Dependence on renal dialysis: Secondary | ICD-10-CM | POA: Diagnosis not present

## 2023-12-04 DIAGNOSIS — D509 Iron deficiency anemia, unspecified: Secondary | ICD-10-CM | POA: Diagnosis not present

## 2023-12-06 DIAGNOSIS — N250 Renal osteodystrophy: Secondary | ICD-10-CM | POA: Diagnosis not present

## 2023-12-06 DIAGNOSIS — N186 End stage renal disease: Secondary | ICD-10-CM | POA: Diagnosis not present

## 2023-12-06 DIAGNOSIS — D509 Iron deficiency anemia, unspecified: Secondary | ICD-10-CM | POA: Diagnosis not present

## 2023-12-06 DIAGNOSIS — D631 Anemia in chronic kidney disease: Secondary | ICD-10-CM | POA: Diagnosis not present

## 2023-12-06 DIAGNOSIS — Z992 Dependence on renal dialysis: Secondary | ICD-10-CM | POA: Diagnosis not present

## 2023-12-07 DIAGNOSIS — J9621 Acute and chronic respiratory failure with hypoxia: Secondary | ICD-10-CM | POA: Diagnosis not present

## 2023-12-07 DIAGNOSIS — J9622 Acute and chronic respiratory failure with hypercapnia: Secondary | ICD-10-CM | POA: Diagnosis not present

## 2023-12-07 DIAGNOSIS — G928 Other toxic encephalopathy: Secondary | ICD-10-CM | POA: Diagnosis not present

## 2023-12-07 DIAGNOSIS — J181 Lobar pneumonia, unspecified organism: Secondary | ICD-10-CM | POA: Diagnosis not present

## 2023-12-07 DIAGNOSIS — J440 Chronic obstructive pulmonary disease with acute lower respiratory infection: Secondary | ICD-10-CM | POA: Diagnosis not present

## 2023-12-07 DIAGNOSIS — Z51A Encounter for sepsis aftercare: Secondary | ICD-10-CM | POA: Diagnosis not present

## 2023-12-08 DIAGNOSIS — N186 End stage renal disease: Secondary | ICD-10-CM | POA: Diagnosis not present

## 2023-12-08 DIAGNOSIS — D631 Anemia in chronic kidney disease: Secondary | ICD-10-CM | POA: Diagnosis not present

## 2023-12-08 DIAGNOSIS — Z992 Dependence on renal dialysis: Secondary | ICD-10-CM | POA: Diagnosis not present

## 2023-12-08 DIAGNOSIS — J22 Unspecified acute lower respiratory infection: Secondary | ICD-10-CM | POA: Diagnosis not present

## 2023-12-08 DIAGNOSIS — D509 Iron deficiency anemia, unspecified: Secondary | ICD-10-CM | POA: Diagnosis not present

## 2023-12-08 DIAGNOSIS — N250 Renal osteodystrophy: Secondary | ICD-10-CM | POA: Diagnosis not present

## 2023-12-09 DIAGNOSIS — N186 End stage renal disease: Secondary | ICD-10-CM | POA: Diagnosis not present

## 2023-12-11 DIAGNOSIS — N186 End stage renal disease: Secondary | ICD-10-CM | POA: Diagnosis not present

## 2023-12-11 DIAGNOSIS — D509 Iron deficiency anemia, unspecified: Secondary | ICD-10-CM | POA: Diagnosis not present

## 2023-12-11 DIAGNOSIS — N250 Renal osteodystrophy: Secondary | ICD-10-CM | POA: Diagnosis not present

## 2023-12-11 DIAGNOSIS — Z992 Dependence on renal dialysis: Secondary | ICD-10-CM | POA: Diagnosis not present

## 2023-12-12 DIAGNOSIS — D631 Anemia in chronic kidney disease: Secondary | ICD-10-CM | POA: Diagnosis not present

## 2023-12-12 DIAGNOSIS — J9621 Acute and chronic respiratory failure with hypoxia: Secondary | ICD-10-CM | POA: Diagnosis not present

## 2023-12-12 DIAGNOSIS — Z792 Long term (current) use of antibiotics: Secondary | ICD-10-CM | POA: Diagnosis not present

## 2023-12-12 DIAGNOSIS — E876 Hypokalemia: Secondary | ICD-10-CM | POA: Diagnosis not present

## 2023-12-12 DIAGNOSIS — Z7984 Long term (current) use of oral hypoglycemic drugs: Secondary | ICD-10-CM | POA: Diagnosis not present

## 2023-12-12 DIAGNOSIS — K219 Gastro-esophageal reflux disease without esophagitis: Secondary | ICD-10-CM | POA: Diagnosis not present

## 2023-12-12 DIAGNOSIS — Z992 Dependence on renal dialysis: Secondary | ICD-10-CM | POA: Diagnosis not present

## 2023-12-12 DIAGNOSIS — Z51A Encounter for sepsis aftercare: Secondary | ICD-10-CM | POA: Diagnosis not present

## 2023-12-12 DIAGNOSIS — F32A Depression, unspecified: Secondary | ICD-10-CM | POA: Diagnosis not present

## 2023-12-12 DIAGNOSIS — Z7902 Long term (current) use of antithrombotics/antiplatelets: Secondary | ICD-10-CM | POA: Diagnosis not present

## 2023-12-12 DIAGNOSIS — J440 Chronic obstructive pulmonary disease with acute lower respiratory infection: Secondary | ICD-10-CM | POA: Diagnosis not present

## 2023-12-12 DIAGNOSIS — J9622 Acute and chronic respiratory failure with hypercapnia: Secondary | ICD-10-CM | POA: Diagnosis not present

## 2023-12-12 DIAGNOSIS — I5042 Chronic combined systolic (congestive) and diastolic (congestive) heart failure: Secondary | ICD-10-CM | POA: Diagnosis not present

## 2023-12-12 DIAGNOSIS — R1310 Dysphagia, unspecified: Secondary | ICD-10-CM | POA: Diagnosis not present

## 2023-12-12 DIAGNOSIS — N186 End stage renal disease: Secondary | ICD-10-CM | POA: Diagnosis not present

## 2023-12-12 DIAGNOSIS — Z955 Presence of coronary angioplasty implant and graft: Secondary | ICD-10-CM | POA: Diagnosis not present

## 2023-12-12 DIAGNOSIS — Z9981 Dependence on supplemental oxygen: Secondary | ICD-10-CM | POA: Diagnosis not present

## 2023-12-12 DIAGNOSIS — I132 Hypertensive heart and chronic kidney disease with heart failure and with stage 5 chronic kidney disease, or end stage renal disease: Secondary | ICD-10-CM | POA: Diagnosis not present

## 2023-12-12 DIAGNOSIS — J181 Lobar pneumonia, unspecified organism: Secondary | ICD-10-CM | POA: Diagnosis not present

## 2023-12-12 DIAGNOSIS — F419 Anxiety disorder, unspecified: Secondary | ICD-10-CM | POA: Diagnosis not present

## 2023-12-12 DIAGNOSIS — E1122 Type 2 diabetes mellitus with diabetic chronic kidney disease: Secondary | ICD-10-CM | POA: Diagnosis not present

## 2023-12-12 DIAGNOSIS — G928 Other toxic encephalopathy: Secondary | ICD-10-CM | POA: Diagnosis not present

## 2023-12-12 DIAGNOSIS — I2510 Atherosclerotic heart disease of native coronary artery without angina pectoris: Secondary | ICD-10-CM | POA: Diagnosis not present

## 2023-12-12 DIAGNOSIS — T50905D Adverse effect of unspecified drugs, medicaments and biological substances, subsequent encounter: Secondary | ICD-10-CM | POA: Diagnosis not present

## 2023-12-13 DIAGNOSIS — Z992 Dependence on renal dialysis: Secondary | ICD-10-CM | POA: Diagnosis not present

## 2023-12-13 DIAGNOSIS — J440 Chronic obstructive pulmonary disease with acute lower respiratory infection: Secondary | ICD-10-CM | POA: Diagnosis not present

## 2023-12-13 DIAGNOSIS — J9621 Acute and chronic respiratory failure with hypoxia: Secondary | ICD-10-CM | POA: Diagnosis not present

## 2023-12-13 DIAGNOSIS — J181 Lobar pneumonia, unspecified organism: Secondary | ICD-10-CM | POA: Diagnosis not present

## 2023-12-13 DIAGNOSIS — R1310 Dysphagia, unspecified: Secondary | ICD-10-CM | POA: Diagnosis not present

## 2023-12-13 DIAGNOSIS — Z51A Encounter for sepsis aftercare: Secondary | ICD-10-CM | POA: Diagnosis not present

## 2023-12-13 DIAGNOSIS — J9622 Acute and chronic respiratory failure with hypercapnia: Secondary | ICD-10-CM | POA: Diagnosis not present

## 2023-12-13 DIAGNOSIS — N250 Renal osteodystrophy: Secondary | ICD-10-CM | POA: Diagnosis not present

## 2023-12-13 DIAGNOSIS — D509 Iron deficiency anemia, unspecified: Secondary | ICD-10-CM | POA: Diagnosis not present

## 2023-12-13 DIAGNOSIS — N186 End stage renal disease: Secondary | ICD-10-CM | POA: Diagnosis not present

## 2023-12-15 DIAGNOSIS — N250 Renal osteodystrophy: Secondary | ICD-10-CM | POA: Diagnosis not present

## 2023-12-15 DIAGNOSIS — D509 Iron deficiency anemia, unspecified: Secondary | ICD-10-CM | POA: Diagnosis not present

## 2023-12-15 DIAGNOSIS — N186 End stage renal disease: Secondary | ICD-10-CM | POA: Diagnosis not present

## 2023-12-15 DIAGNOSIS — Z992 Dependence on renal dialysis: Secondary | ICD-10-CM | POA: Diagnosis not present

## 2023-12-18 DIAGNOSIS — N186 End stage renal disease: Secondary | ICD-10-CM | POA: Diagnosis not present

## 2023-12-18 DIAGNOSIS — D509 Iron deficiency anemia, unspecified: Secondary | ICD-10-CM | POA: Diagnosis not present

## 2023-12-18 DIAGNOSIS — N250 Renal osteodystrophy: Secondary | ICD-10-CM | POA: Diagnosis not present

## 2023-12-18 DIAGNOSIS — Z992 Dependence on renal dialysis: Secondary | ICD-10-CM | POA: Diagnosis not present

## 2023-12-20 DIAGNOSIS — N186 End stage renal disease: Secondary | ICD-10-CM | POA: Diagnosis not present

## 2023-12-20 DIAGNOSIS — J9622 Acute and chronic respiratory failure with hypercapnia: Secondary | ICD-10-CM | POA: Diagnosis not present

## 2023-12-20 DIAGNOSIS — D509 Iron deficiency anemia, unspecified: Secondary | ICD-10-CM | POA: Diagnosis not present

## 2023-12-20 DIAGNOSIS — J181 Lobar pneumonia, unspecified organism: Secondary | ICD-10-CM | POA: Diagnosis not present

## 2023-12-20 DIAGNOSIS — Z51A Encounter for sepsis aftercare: Secondary | ICD-10-CM | POA: Diagnosis not present

## 2023-12-20 DIAGNOSIS — Z992 Dependence on renal dialysis: Secondary | ICD-10-CM | POA: Diagnosis not present

## 2023-12-20 DIAGNOSIS — J9621 Acute and chronic respiratory failure with hypoxia: Secondary | ICD-10-CM | POA: Diagnosis not present

## 2023-12-20 DIAGNOSIS — N250 Renal osteodystrophy: Secondary | ICD-10-CM | POA: Diagnosis not present

## 2023-12-20 DIAGNOSIS — J440 Chronic obstructive pulmonary disease with acute lower respiratory infection: Secondary | ICD-10-CM | POA: Diagnosis not present

## 2023-12-20 DIAGNOSIS — R1310 Dysphagia, unspecified: Secondary | ICD-10-CM | POA: Diagnosis not present

## 2023-12-22 DIAGNOSIS — Z992 Dependence on renal dialysis: Secondary | ICD-10-CM | POA: Diagnosis not present

## 2023-12-22 DIAGNOSIS — N186 End stage renal disease: Secondary | ICD-10-CM | POA: Diagnosis not present

## 2023-12-22 DIAGNOSIS — D509 Iron deficiency anemia, unspecified: Secondary | ICD-10-CM | POA: Diagnosis not present

## 2023-12-22 DIAGNOSIS — N250 Renal osteodystrophy: Secondary | ICD-10-CM | POA: Diagnosis not present

## 2023-12-25 DIAGNOSIS — Z992 Dependence on renal dialysis: Secondary | ICD-10-CM | POA: Diagnosis not present

## 2023-12-25 DIAGNOSIS — N186 End stage renal disease: Secondary | ICD-10-CM | POA: Diagnosis not present

## 2023-12-25 DIAGNOSIS — D509 Iron deficiency anemia, unspecified: Secondary | ICD-10-CM | POA: Diagnosis not present

## 2023-12-25 DIAGNOSIS — N250 Renal osteodystrophy: Secondary | ICD-10-CM | POA: Diagnosis not present

## 2023-12-27 DIAGNOSIS — J9622 Acute and chronic respiratory failure with hypercapnia: Secondary | ICD-10-CM | POA: Diagnosis not present

## 2023-12-27 DIAGNOSIS — N186 End stage renal disease: Secondary | ICD-10-CM | POA: Diagnosis not present

## 2023-12-27 DIAGNOSIS — J9621 Acute and chronic respiratory failure with hypoxia: Secondary | ICD-10-CM | POA: Diagnosis not present

## 2023-12-27 DIAGNOSIS — J181 Lobar pneumonia, unspecified organism: Secondary | ICD-10-CM | POA: Diagnosis not present

## 2023-12-27 DIAGNOSIS — Z992 Dependence on renal dialysis: Secondary | ICD-10-CM | POA: Diagnosis not present

## 2023-12-27 DIAGNOSIS — R1310 Dysphagia, unspecified: Secondary | ICD-10-CM | POA: Diagnosis not present

## 2023-12-27 DIAGNOSIS — J440 Chronic obstructive pulmonary disease with acute lower respiratory infection: Secondary | ICD-10-CM | POA: Diagnosis not present

## 2023-12-27 DIAGNOSIS — Z51A Encounter for sepsis aftercare: Secondary | ICD-10-CM | POA: Diagnosis not present

## 2023-12-27 DIAGNOSIS — N250 Renal osteodystrophy: Secondary | ICD-10-CM | POA: Diagnosis not present

## 2023-12-27 DIAGNOSIS — D509 Iron deficiency anemia, unspecified: Secondary | ICD-10-CM | POA: Diagnosis not present

## 2023-12-29 DIAGNOSIS — Z20822 Contact with and (suspected) exposure to covid-19: Secondary | ICD-10-CM | POA: Diagnosis not present

## 2023-12-29 DIAGNOSIS — D509 Iron deficiency anemia, unspecified: Secondary | ICD-10-CM | POA: Diagnosis not present

## 2023-12-29 DIAGNOSIS — J189 Pneumonia, unspecified organism: Secondary | ICD-10-CM | POA: Diagnosis not present

## 2023-12-29 DIAGNOSIS — R509 Fever, unspecified: Secondary | ICD-10-CM | POA: Diagnosis not present

## 2023-12-29 DIAGNOSIS — I509 Heart failure, unspecified: Secondary | ICD-10-CM | POA: Diagnosis not present

## 2023-12-29 DIAGNOSIS — J690 Pneumonitis due to inhalation of food and vomit: Secondary | ICD-10-CM | POA: Diagnosis not present

## 2023-12-29 DIAGNOSIS — N250 Renal osteodystrophy: Secondary | ICD-10-CM | POA: Diagnosis not present

## 2023-12-29 DIAGNOSIS — Z992 Dependence on renal dialysis: Secondary | ICD-10-CM | POA: Diagnosis not present

## 2023-12-29 DIAGNOSIS — J40 Bronchitis, not specified as acute or chronic: Secondary | ICD-10-CM | POA: Diagnosis not present

## 2023-12-29 DIAGNOSIS — Z880 Allergy status to penicillin: Secondary | ICD-10-CM | POA: Diagnosis not present

## 2023-12-29 DIAGNOSIS — N186 End stage renal disease: Secondary | ICD-10-CM | POA: Diagnosis not present

## 2023-12-29 DIAGNOSIS — I132 Hypertensive heart and chronic kidney disease with heart failure and with stage 5 chronic kidney disease, or end stage renal disease: Secondary | ICD-10-CM | POA: Diagnosis not present

## 2023-12-29 DIAGNOSIS — N189 Chronic kidney disease, unspecified: Secondary | ICD-10-CM | POA: Diagnosis not present

## 2023-12-29 DIAGNOSIS — J209 Acute bronchitis, unspecified: Secondary | ICD-10-CM | POA: Diagnosis not present

## 2023-12-29 DIAGNOSIS — R059 Cough, unspecified: Secondary | ICD-10-CM | POA: Diagnosis not present

## 2023-12-29 DIAGNOSIS — Z1152 Encounter for screening for COVID-19: Secondary | ICD-10-CM | POA: Diagnosis not present

## 2023-12-31 DIAGNOSIS — Z87891 Personal history of nicotine dependence: Secondary | ICD-10-CM | POA: Diagnosis not present

## 2023-12-31 DIAGNOSIS — I10 Essential (primary) hypertension: Secondary | ICD-10-CM | POA: Diagnosis not present

## 2023-12-31 DIAGNOSIS — I2510 Atherosclerotic heart disease of native coronary artery without angina pectoris: Secondary | ICD-10-CM | POA: Diagnosis not present

## 2023-12-31 DIAGNOSIS — J9611 Chronic respiratory failure with hypoxia: Secondary | ICD-10-CM | POA: Diagnosis not present

## 2023-12-31 DIAGNOSIS — Z931 Gastrostomy status: Secondary | ICD-10-CM | POA: Diagnosis not present

## 2023-12-31 DIAGNOSIS — I509 Heart failure, unspecified: Secondary | ICD-10-CM | POA: Diagnosis not present

## 2024-01-01 DIAGNOSIS — N186 End stage renal disease: Secondary | ICD-10-CM | POA: Diagnosis not present

## 2024-01-01 DIAGNOSIS — N250 Renal osteodystrophy: Secondary | ICD-10-CM | POA: Diagnosis not present

## 2024-01-01 DIAGNOSIS — D509 Iron deficiency anemia, unspecified: Secondary | ICD-10-CM | POA: Diagnosis not present

## 2024-01-01 DIAGNOSIS — Z992 Dependence on renal dialysis: Secondary | ICD-10-CM | POA: Diagnosis not present

## 2024-01-03 DIAGNOSIS — N250 Renal osteodystrophy: Secondary | ICD-10-CM | POA: Diagnosis not present

## 2024-01-03 DIAGNOSIS — N186 End stage renal disease: Secondary | ICD-10-CM | POA: Diagnosis not present

## 2024-01-03 DIAGNOSIS — D509 Iron deficiency anemia, unspecified: Secondary | ICD-10-CM | POA: Diagnosis not present

## 2024-01-03 DIAGNOSIS — Z992 Dependence on renal dialysis: Secondary | ICD-10-CM | POA: Diagnosis not present

## 2024-01-05 DIAGNOSIS — N250 Renal osteodystrophy: Secondary | ICD-10-CM | POA: Diagnosis not present

## 2024-01-05 DIAGNOSIS — D509 Iron deficiency anemia, unspecified: Secondary | ICD-10-CM | POA: Diagnosis not present

## 2024-01-05 DIAGNOSIS — N186 End stage renal disease: Secondary | ICD-10-CM | POA: Diagnosis not present

## 2024-01-05 DIAGNOSIS — Z992 Dependence on renal dialysis: Secondary | ICD-10-CM | POA: Diagnosis not present

## 2024-01-08 DIAGNOSIS — J9622 Acute and chronic respiratory failure with hypercapnia: Secondary | ICD-10-CM | POA: Diagnosis not present

## 2024-01-08 DIAGNOSIS — D509 Iron deficiency anemia, unspecified: Secondary | ICD-10-CM | POA: Diagnosis not present

## 2024-01-08 DIAGNOSIS — N186 End stage renal disease: Secondary | ICD-10-CM | POA: Diagnosis not present

## 2024-01-08 DIAGNOSIS — J440 Chronic obstructive pulmonary disease with acute lower respiratory infection: Secondary | ICD-10-CM | POA: Diagnosis not present

## 2024-01-08 DIAGNOSIS — N250 Renal osteodystrophy: Secondary | ICD-10-CM | POA: Diagnosis not present

## 2024-01-08 DIAGNOSIS — Z992 Dependence on renal dialysis: Secondary | ICD-10-CM | POA: Diagnosis not present

## 2024-01-08 DIAGNOSIS — R1310 Dysphagia, unspecified: Secondary | ICD-10-CM | POA: Diagnosis not present

## 2024-01-08 DIAGNOSIS — J9621 Acute and chronic respiratory failure with hypoxia: Secondary | ICD-10-CM | POA: Diagnosis not present

## 2024-01-08 DIAGNOSIS — Z51A Encounter for sepsis aftercare: Secondary | ICD-10-CM | POA: Diagnosis not present

## 2024-01-08 DIAGNOSIS — J181 Lobar pneumonia, unspecified organism: Secondary | ICD-10-CM | POA: Diagnosis not present

## 2024-01-08 DIAGNOSIS — D631 Anemia in chronic kidney disease: Secondary | ICD-10-CM | POA: Diagnosis not present

## 2024-01-10 DIAGNOSIS — Z992 Dependence on renal dialysis: Secondary | ICD-10-CM | POA: Diagnosis not present

## 2024-01-10 DIAGNOSIS — D509 Iron deficiency anemia, unspecified: Secondary | ICD-10-CM | POA: Diagnosis not present

## 2024-01-10 DIAGNOSIS — N186 End stage renal disease: Secondary | ICD-10-CM | POA: Diagnosis not present

## 2024-01-10 DIAGNOSIS — D631 Anemia in chronic kidney disease: Secondary | ICD-10-CM | POA: Diagnosis not present

## 2024-01-10 DIAGNOSIS — N250 Renal osteodystrophy: Secondary | ICD-10-CM | POA: Diagnosis not present

## 2024-01-12 DIAGNOSIS — D509 Iron deficiency anemia, unspecified: Secondary | ICD-10-CM | POA: Diagnosis not present

## 2024-01-12 DIAGNOSIS — Z992 Dependence on renal dialysis: Secondary | ICD-10-CM | POA: Diagnosis not present

## 2024-01-12 DIAGNOSIS — N186 End stage renal disease: Secondary | ICD-10-CM | POA: Diagnosis not present

## 2024-01-12 DIAGNOSIS — D631 Anemia in chronic kidney disease: Secondary | ICD-10-CM | POA: Diagnosis not present

## 2024-01-12 DIAGNOSIS — N250 Renal osteodystrophy: Secondary | ICD-10-CM | POA: Diagnosis not present

## 2024-01-14 DIAGNOSIS — F329 Major depressive disorder, single episode, unspecified: Secondary | ICD-10-CM | POA: Diagnosis not present

## 2024-01-15 DIAGNOSIS — D631 Anemia in chronic kidney disease: Secondary | ICD-10-CM | POA: Diagnosis not present

## 2024-01-15 DIAGNOSIS — Z992 Dependence on renal dialysis: Secondary | ICD-10-CM | POA: Diagnosis not present

## 2024-01-15 DIAGNOSIS — N250 Renal osteodystrophy: Secondary | ICD-10-CM | POA: Diagnosis not present

## 2024-01-15 DIAGNOSIS — D509 Iron deficiency anemia, unspecified: Secondary | ICD-10-CM | POA: Diagnosis not present

## 2024-01-15 DIAGNOSIS — N186 End stage renal disease: Secondary | ICD-10-CM | POA: Diagnosis not present

## 2024-01-17 DIAGNOSIS — N186 End stage renal disease: Secondary | ICD-10-CM | POA: Diagnosis not present

## 2024-01-17 DIAGNOSIS — N250 Renal osteodystrophy: Secondary | ICD-10-CM | POA: Diagnosis not present

## 2024-01-17 DIAGNOSIS — D631 Anemia in chronic kidney disease: Secondary | ICD-10-CM | POA: Diagnosis not present

## 2024-01-17 DIAGNOSIS — Z992 Dependence on renal dialysis: Secondary | ICD-10-CM | POA: Diagnosis not present

## 2024-01-17 DIAGNOSIS — D509 Iron deficiency anemia, unspecified: Secondary | ICD-10-CM | POA: Diagnosis not present

## 2024-01-18 DIAGNOSIS — I129 Hypertensive chronic kidney disease with stage 1 through stage 4 chronic kidney disease, or unspecified chronic kidney disease: Secondary | ICD-10-CM | POA: Diagnosis not present

## 2024-01-18 DIAGNOSIS — Z992 Dependence on renal dialysis: Secondary | ICD-10-CM | POA: Diagnosis not present

## 2024-01-18 DIAGNOSIS — J9611 Chronic respiratory failure with hypoxia: Secondary | ICD-10-CM | POA: Diagnosis not present

## 2024-01-18 DIAGNOSIS — N186 End stage renal disease: Secondary | ICD-10-CM | POA: Diagnosis not present

## 2024-01-18 DIAGNOSIS — F411 Generalized anxiety disorder: Secondary | ICD-10-CM | POA: Diagnosis not present

## 2024-01-18 DIAGNOSIS — E1122 Type 2 diabetes mellitus with diabetic chronic kidney disease: Secondary | ICD-10-CM | POA: Diagnosis not present

## 2024-01-18 DIAGNOSIS — I5032 Chronic diastolic (congestive) heart failure: Secondary | ICD-10-CM | POA: Diagnosis not present

## 2024-01-18 DIAGNOSIS — E875 Hyperkalemia: Secondary | ICD-10-CM | POA: Diagnosis not present

## 2024-01-18 DIAGNOSIS — Z87891 Personal history of nicotine dependence: Secondary | ICD-10-CM | POA: Diagnosis not present

## 2024-01-18 DIAGNOSIS — D638 Anemia in other chronic diseases classified elsewhere: Secondary | ICD-10-CM | POA: Diagnosis not present

## 2024-01-19 DIAGNOSIS — D631 Anemia in chronic kidney disease: Secondary | ICD-10-CM | POA: Diagnosis not present

## 2024-01-19 DIAGNOSIS — D509 Iron deficiency anemia, unspecified: Secondary | ICD-10-CM | POA: Diagnosis not present

## 2024-01-19 DIAGNOSIS — N250 Renal osteodystrophy: Secondary | ICD-10-CM | POA: Diagnosis not present

## 2024-01-19 DIAGNOSIS — N186 End stage renal disease: Secondary | ICD-10-CM | POA: Diagnosis not present

## 2024-01-19 DIAGNOSIS — Z992 Dependence on renal dialysis: Secondary | ICD-10-CM | POA: Diagnosis not present

## 2024-01-21 DIAGNOSIS — J9611 Chronic respiratory failure with hypoxia: Secondary | ICD-10-CM | POA: Diagnosis not present

## 2024-01-21 DIAGNOSIS — I509 Heart failure, unspecified: Secondary | ICD-10-CM | POA: Diagnosis not present

## 2024-01-21 DIAGNOSIS — Z87891 Personal history of nicotine dependence: Secondary | ICD-10-CM | POA: Diagnosis not present

## 2024-01-22 DIAGNOSIS — N186 End stage renal disease: Secondary | ICD-10-CM | POA: Diagnosis not present

## 2024-01-22 DIAGNOSIS — N250 Renal osteodystrophy: Secondary | ICD-10-CM | POA: Diagnosis not present

## 2024-01-22 DIAGNOSIS — D509 Iron deficiency anemia, unspecified: Secondary | ICD-10-CM | POA: Diagnosis not present

## 2024-01-22 DIAGNOSIS — D631 Anemia in chronic kidney disease: Secondary | ICD-10-CM | POA: Diagnosis not present

## 2024-01-22 DIAGNOSIS — Z992 Dependence on renal dialysis: Secondary | ICD-10-CM | POA: Diagnosis not present

## 2024-01-24 DIAGNOSIS — Z992 Dependence on renal dialysis: Secondary | ICD-10-CM | POA: Diagnosis not present

## 2024-01-24 DIAGNOSIS — D509 Iron deficiency anemia, unspecified: Secondary | ICD-10-CM | POA: Diagnosis not present

## 2024-01-24 DIAGNOSIS — N250 Renal osteodystrophy: Secondary | ICD-10-CM | POA: Diagnosis not present

## 2024-01-24 DIAGNOSIS — D631 Anemia in chronic kidney disease: Secondary | ICD-10-CM | POA: Diagnosis not present

## 2024-01-24 DIAGNOSIS — N186 End stage renal disease: Secondary | ICD-10-CM | POA: Diagnosis not present

## 2024-01-26 DIAGNOSIS — N250 Renal osteodystrophy: Secondary | ICD-10-CM | POA: Diagnosis not present

## 2024-01-26 DIAGNOSIS — N186 End stage renal disease: Secondary | ICD-10-CM | POA: Diagnosis not present

## 2024-01-26 DIAGNOSIS — Z992 Dependence on renal dialysis: Secondary | ICD-10-CM | POA: Diagnosis not present

## 2024-01-26 DIAGNOSIS — D631 Anemia in chronic kidney disease: Secondary | ICD-10-CM | POA: Diagnosis not present

## 2024-01-26 DIAGNOSIS — D509 Iron deficiency anemia, unspecified: Secondary | ICD-10-CM | POA: Diagnosis not present

## 2024-01-29 DIAGNOSIS — D631 Anemia in chronic kidney disease: Secondary | ICD-10-CM | POA: Diagnosis not present

## 2024-01-29 DIAGNOSIS — D509 Iron deficiency anemia, unspecified: Secondary | ICD-10-CM | POA: Diagnosis not present

## 2024-01-29 DIAGNOSIS — Z992 Dependence on renal dialysis: Secondary | ICD-10-CM | POA: Diagnosis not present

## 2024-01-29 DIAGNOSIS — N186 End stage renal disease: Secondary | ICD-10-CM | POA: Diagnosis not present

## 2024-01-29 DIAGNOSIS — N250 Renal osteodystrophy: Secondary | ICD-10-CM | POA: Diagnosis not present

## 2024-01-31 DIAGNOSIS — Z992 Dependence on renal dialysis: Secondary | ICD-10-CM | POA: Diagnosis not present

## 2024-01-31 DIAGNOSIS — N250 Renal osteodystrophy: Secondary | ICD-10-CM | POA: Diagnosis not present

## 2024-01-31 DIAGNOSIS — N186 End stage renal disease: Secondary | ICD-10-CM | POA: Diagnosis not present

## 2024-01-31 DIAGNOSIS — D509 Iron deficiency anemia, unspecified: Secondary | ICD-10-CM | POA: Diagnosis not present

## 2024-01-31 DIAGNOSIS — D631 Anemia in chronic kidney disease: Secondary | ICD-10-CM | POA: Diagnosis not present

## 2024-02-01 DIAGNOSIS — D631 Anemia in chronic kidney disease: Secondary | ICD-10-CM | POA: Diagnosis not present

## 2024-02-01 DIAGNOSIS — D509 Iron deficiency anemia, unspecified: Secondary | ICD-10-CM | POA: Diagnosis not present

## 2024-02-01 DIAGNOSIS — N186 End stage renal disease: Secondary | ICD-10-CM | POA: Diagnosis not present

## 2024-02-01 DIAGNOSIS — Z992 Dependence on renal dialysis: Secondary | ICD-10-CM | POA: Diagnosis not present

## 2024-02-01 DIAGNOSIS — N250 Renal osteodystrophy: Secondary | ICD-10-CM | POA: Diagnosis not present

## 2024-02-02 DIAGNOSIS — Z992 Dependence on renal dialysis: Secondary | ICD-10-CM | POA: Diagnosis not present

## 2024-02-02 DIAGNOSIS — N250 Renal osteodystrophy: Secondary | ICD-10-CM | POA: Diagnosis not present

## 2024-02-02 DIAGNOSIS — N186 End stage renal disease: Secondary | ICD-10-CM | POA: Diagnosis not present

## 2024-02-02 DIAGNOSIS — D509 Iron deficiency anemia, unspecified: Secondary | ICD-10-CM | POA: Diagnosis not present

## 2024-02-02 DIAGNOSIS — D631 Anemia in chronic kidney disease: Secondary | ICD-10-CM | POA: Diagnosis not present

## 2024-02-05 DIAGNOSIS — D631 Anemia in chronic kidney disease: Secondary | ICD-10-CM | POA: Diagnosis not present

## 2024-02-05 DIAGNOSIS — N250 Renal osteodystrophy: Secondary | ICD-10-CM | POA: Diagnosis not present

## 2024-02-05 DIAGNOSIS — D509 Iron deficiency anemia, unspecified: Secondary | ICD-10-CM | POA: Diagnosis not present

## 2024-02-05 DIAGNOSIS — Z992 Dependence on renal dialysis: Secondary | ICD-10-CM | POA: Diagnosis not present

## 2024-02-05 DIAGNOSIS — N186 End stage renal disease: Secondary | ICD-10-CM | POA: Diagnosis not present

## 2024-02-07 DIAGNOSIS — Z992 Dependence on renal dialysis: Secondary | ICD-10-CM | POA: Diagnosis not present

## 2024-02-07 DIAGNOSIS — N250 Renal osteodystrophy: Secondary | ICD-10-CM | POA: Diagnosis not present

## 2024-02-07 DIAGNOSIS — D631 Anemia in chronic kidney disease: Secondary | ICD-10-CM | POA: Diagnosis not present

## 2024-02-07 DIAGNOSIS — N186 End stage renal disease: Secondary | ICD-10-CM | POA: Diagnosis not present

## 2024-02-07 DIAGNOSIS — D509 Iron deficiency anemia, unspecified: Secondary | ICD-10-CM | POA: Diagnosis not present

## 2024-02-08 DIAGNOSIS — D631 Anemia in chronic kidney disease: Secondary | ICD-10-CM | POA: Diagnosis not present

## 2024-02-08 DIAGNOSIS — N186 End stage renal disease: Secondary | ICD-10-CM | POA: Diagnosis not present

## 2024-02-08 DIAGNOSIS — N250 Renal osteodystrophy: Secondary | ICD-10-CM | POA: Diagnosis not present

## 2024-02-08 DIAGNOSIS — Z992 Dependence on renal dialysis: Secondary | ICD-10-CM | POA: Diagnosis not present

## 2024-02-08 DIAGNOSIS — N2581 Secondary hyperparathyroidism of renal origin: Secondary | ICD-10-CM | POA: Diagnosis not present

## 2024-02-08 DIAGNOSIS — D509 Iron deficiency anemia, unspecified: Secondary | ICD-10-CM | POA: Diagnosis not present

## 2024-02-09 DIAGNOSIS — N2581 Secondary hyperparathyroidism of renal origin: Secondary | ICD-10-CM | POA: Diagnosis not present

## 2024-02-09 DIAGNOSIS — D631 Anemia in chronic kidney disease: Secondary | ICD-10-CM | POA: Diagnosis not present

## 2024-02-09 DIAGNOSIS — N186 End stage renal disease: Secondary | ICD-10-CM | POA: Diagnosis not present

## 2024-02-09 DIAGNOSIS — Z992 Dependence on renal dialysis: Secondary | ICD-10-CM | POA: Diagnosis not present

## 2024-02-09 DIAGNOSIS — D509 Iron deficiency anemia, unspecified: Secondary | ICD-10-CM | POA: Diagnosis not present

## 2024-02-09 DIAGNOSIS — N250 Renal osteodystrophy: Secondary | ICD-10-CM | POA: Diagnosis not present

## 2024-02-12 DIAGNOSIS — D509 Iron deficiency anemia, unspecified: Secondary | ICD-10-CM | POA: Diagnosis not present

## 2024-02-12 DIAGNOSIS — Z992 Dependence on renal dialysis: Secondary | ICD-10-CM | POA: Diagnosis not present

## 2024-02-12 DIAGNOSIS — N186 End stage renal disease: Secondary | ICD-10-CM | POA: Diagnosis not present

## 2024-02-12 DIAGNOSIS — N250 Renal osteodystrophy: Secondary | ICD-10-CM | POA: Diagnosis not present

## 2024-02-12 DIAGNOSIS — D631 Anemia in chronic kidney disease: Secondary | ICD-10-CM | POA: Diagnosis not present

## 2024-02-12 DIAGNOSIS — N2581 Secondary hyperparathyroidism of renal origin: Secondary | ICD-10-CM | POA: Diagnosis not present

## 2024-02-13 DIAGNOSIS — K449 Diaphragmatic hernia without obstruction or gangrene: Secondary | ICD-10-CM | POA: Diagnosis not present

## 2024-02-13 DIAGNOSIS — K219 Gastro-esophageal reflux disease without esophagitis: Secondary | ICD-10-CM | POA: Diagnosis not present

## 2024-02-13 DIAGNOSIS — Z880 Allergy status to penicillin: Secondary | ICD-10-CM | POA: Diagnosis not present

## 2024-02-14 DIAGNOSIS — N2581 Secondary hyperparathyroidism of renal origin: Secondary | ICD-10-CM | POA: Diagnosis not present

## 2024-02-14 DIAGNOSIS — N250 Renal osteodystrophy: Secondary | ICD-10-CM | POA: Diagnosis not present

## 2024-02-14 DIAGNOSIS — Z992 Dependence on renal dialysis: Secondary | ICD-10-CM | POA: Diagnosis not present

## 2024-02-14 DIAGNOSIS — D631 Anemia in chronic kidney disease: Secondary | ICD-10-CM | POA: Diagnosis not present

## 2024-02-14 DIAGNOSIS — D509 Iron deficiency anemia, unspecified: Secondary | ICD-10-CM | POA: Diagnosis not present

## 2024-02-14 DIAGNOSIS — N186 End stage renal disease: Secondary | ICD-10-CM | POA: Diagnosis not present

## 2024-02-16 DIAGNOSIS — N186 End stage renal disease: Secondary | ICD-10-CM | POA: Diagnosis not present

## 2024-02-16 DIAGNOSIS — N2581 Secondary hyperparathyroidism of renal origin: Secondary | ICD-10-CM | POA: Diagnosis not present

## 2024-02-16 DIAGNOSIS — N250 Renal osteodystrophy: Secondary | ICD-10-CM | POA: Diagnosis not present

## 2024-02-16 DIAGNOSIS — Z992 Dependence on renal dialysis: Secondary | ICD-10-CM | POA: Diagnosis not present

## 2024-02-16 DIAGNOSIS — D631 Anemia in chronic kidney disease: Secondary | ICD-10-CM | POA: Diagnosis not present

## 2024-02-16 DIAGNOSIS — D509 Iron deficiency anemia, unspecified: Secondary | ICD-10-CM | POA: Diagnosis not present

## 2024-02-19 DIAGNOSIS — I509 Heart failure, unspecified: Secondary | ICD-10-CM | POA: Diagnosis not present

## 2024-02-19 DIAGNOSIS — Z992 Dependence on renal dialysis: Secondary | ICD-10-CM | POA: Diagnosis not present

## 2024-02-19 DIAGNOSIS — D509 Iron deficiency anemia, unspecified: Secondary | ICD-10-CM | POA: Diagnosis not present

## 2024-02-19 DIAGNOSIS — D631 Anemia in chronic kidney disease: Secondary | ICD-10-CM | POA: Diagnosis not present

## 2024-02-19 DIAGNOSIS — N2581 Secondary hyperparathyroidism of renal origin: Secondary | ICD-10-CM | POA: Diagnosis not present

## 2024-02-19 DIAGNOSIS — Z87891 Personal history of nicotine dependence: Secondary | ICD-10-CM | POA: Diagnosis not present

## 2024-02-19 DIAGNOSIS — N250 Renal osteodystrophy: Secondary | ICD-10-CM | POA: Diagnosis not present

## 2024-02-19 DIAGNOSIS — N186 End stage renal disease: Secondary | ICD-10-CM | POA: Diagnosis not present

## 2024-02-19 DIAGNOSIS — J9611 Chronic respiratory failure with hypoxia: Secondary | ICD-10-CM | POA: Diagnosis not present

## 2024-02-21 DIAGNOSIS — D631 Anemia in chronic kidney disease: Secondary | ICD-10-CM | POA: Diagnosis not present

## 2024-02-21 DIAGNOSIS — N186 End stage renal disease: Secondary | ICD-10-CM | POA: Diagnosis not present

## 2024-02-21 DIAGNOSIS — D509 Iron deficiency anemia, unspecified: Secondary | ICD-10-CM | POA: Diagnosis not present

## 2024-02-21 DIAGNOSIS — N250 Renal osteodystrophy: Secondary | ICD-10-CM | POA: Diagnosis not present

## 2024-02-21 DIAGNOSIS — Z992 Dependence on renal dialysis: Secondary | ICD-10-CM | POA: Diagnosis not present

## 2024-02-21 DIAGNOSIS — N2581 Secondary hyperparathyroidism of renal origin: Secondary | ICD-10-CM | POA: Diagnosis not present

## 2024-02-23 DIAGNOSIS — N250 Renal osteodystrophy: Secondary | ICD-10-CM | POA: Diagnosis not present

## 2024-02-23 DIAGNOSIS — N2581 Secondary hyperparathyroidism of renal origin: Secondary | ICD-10-CM | POA: Diagnosis not present

## 2024-02-23 DIAGNOSIS — D631 Anemia in chronic kidney disease: Secondary | ICD-10-CM | POA: Diagnosis not present

## 2024-02-23 DIAGNOSIS — D509 Iron deficiency anemia, unspecified: Secondary | ICD-10-CM | POA: Diagnosis not present

## 2024-02-23 DIAGNOSIS — Z992 Dependence on renal dialysis: Secondary | ICD-10-CM | POA: Diagnosis not present

## 2024-02-23 DIAGNOSIS — N186 End stage renal disease: Secondary | ICD-10-CM | POA: Diagnosis not present

## 2024-02-26 DIAGNOSIS — N250 Renal osteodystrophy: Secondary | ICD-10-CM | POA: Diagnosis not present

## 2024-02-26 DIAGNOSIS — Z992 Dependence on renal dialysis: Secondary | ICD-10-CM | POA: Diagnosis not present

## 2024-02-26 DIAGNOSIS — D509 Iron deficiency anemia, unspecified: Secondary | ICD-10-CM | POA: Diagnosis not present

## 2024-02-26 DIAGNOSIS — N2581 Secondary hyperparathyroidism of renal origin: Secondary | ICD-10-CM | POA: Diagnosis not present

## 2024-02-26 DIAGNOSIS — D631 Anemia in chronic kidney disease: Secondary | ICD-10-CM | POA: Diagnosis not present

## 2024-02-26 DIAGNOSIS — N186 End stage renal disease: Secondary | ICD-10-CM | POA: Diagnosis not present

## 2024-02-28 DIAGNOSIS — D509 Iron deficiency anemia, unspecified: Secondary | ICD-10-CM | POA: Diagnosis not present

## 2024-02-28 DIAGNOSIS — N2581 Secondary hyperparathyroidism of renal origin: Secondary | ICD-10-CM | POA: Diagnosis not present

## 2024-02-28 DIAGNOSIS — Z992 Dependence on renal dialysis: Secondary | ICD-10-CM | POA: Diagnosis not present

## 2024-02-28 DIAGNOSIS — N250 Renal osteodystrophy: Secondary | ICD-10-CM | POA: Diagnosis not present

## 2024-02-28 DIAGNOSIS — D631 Anemia in chronic kidney disease: Secondary | ICD-10-CM | POA: Diagnosis not present

## 2024-02-28 DIAGNOSIS — N186 End stage renal disease: Secondary | ICD-10-CM | POA: Diagnosis not present

## 2024-03-01 DIAGNOSIS — D509 Iron deficiency anemia, unspecified: Secondary | ICD-10-CM | POA: Diagnosis not present

## 2024-03-01 DIAGNOSIS — N250 Renal osteodystrophy: Secondary | ICD-10-CM | POA: Diagnosis not present

## 2024-03-01 DIAGNOSIS — D631 Anemia in chronic kidney disease: Secondary | ICD-10-CM | POA: Diagnosis not present

## 2024-03-01 DIAGNOSIS — N2581 Secondary hyperparathyroidism of renal origin: Secondary | ICD-10-CM | POA: Diagnosis not present

## 2024-03-01 DIAGNOSIS — N186 End stage renal disease: Secondary | ICD-10-CM | POA: Diagnosis not present

## 2024-03-01 DIAGNOSIS — Z992 Dependence on renal dialysis: Secondary | ICD-10-CM | POA: Diagnosis not present

## 2024-03-04 DIAGNOSIS — N186 End stage renal disease: Secondary | ICD-10-CM | POA: Diagnosis not present

## 2024-03-04 DIAGNOSIS — D509 Iron deficiency anemia, unspecified: Secondary | ICD-10-CM | POA: Diagnosis not present

## 2024-03-04 DIAGNOSIS — N250 Renal osteodystrophy: Secondary | ICD-10-CM | POA: Diagnosis not present

## 2024-03-04 DIAGNOSIS — D631 Anemia in chronic kidney disease: Secondary | ICD-10-CM | POA: Diagnosis not present

## 2024-03-04 DIAGNOSIS — N2581 Secondary hyperparathyroidism of renal origin: Secondary | ICD-10-CM | POA: Diagnosis not present

## 2024-03-04 DIAGNOSIS — Z992 Dependence on renal dialysis: Secondary | ICD-10-CM | POA: Diagnosis not present

## 2024-03-06 DIAGNOSIS — N2581 Secondary hyperparathyroidism of renal origin: Secondary | ICD-10-CM | POA: Diagnosis not present

## 2024-03-06 DIAGNOSIS — Z992 Dependence on renal dialysis: Secondary | ICD-10-CM | POA: Diagnosis not present

## 2024-03-06 DIAGNOSIS — N250 Renal osteodystrophy: Secondary | ICD-10-CM | POA: Diagnosis not present

## 2024-03-06 DIAGNOSIS — D509 Iron deficiency anemia, unspecified: Secondary | ICD-10-CM | POA: Diagnosis not present

## 2024-03-06 DIAGNOSIS — D631 Anemia in chronic kidney disease: Secondary | ICD-10-CM | POA: Diagnosis not present

## 2024-03-06 DIAGNOSIS — N186 End stage renal disease: Secondary | ICD-10-CM | POA: Diagnosis not present

## 2024-03-08 DIAGNOSIS — D631 Anemia in chronic kidney disease: Secondary | ICD-10-CM | POA: Diagnosis not present

## 2024-03-08 DIAGNOSIS — N2581 Secondary hyperparathyroidism of renal origin: Secondary | ICD-10-CM | POA: Diagnosis not present

## 2024-03-08 DIAGNOSIS — Z992 Dependence on renal dialysis: Secondary | ICD-10-CM | POA: Diagnosis not present

## 2024-03-08 DIAGNOSIS — N186 End stage renal disease: Secondary | ICD-10-CM | POA: Diagnosis not present

## 2024-03-08 DIAGNOSIS — N250 Renal osteodystrophy: Secondary | ICD-10-CM | POA: Diagnosis not present

## 2024-03-08 DIAGNOSIS — D509 Iron deficiency anemia, unspecified: Secondary | ICD-10-CM | POA: Diagnosis not present

## 2024-03-10 DIAGNOSIS — N186 End stage renal disease: Secondary | ICD-10-CM | POA: Diagnosis not present

## 2024-03-11 DIAGNOSIS — N186 End stage renal disease: Secondary | ICD-10-CM | POA: Diagnosis not present

## 2024-03-11 DIAGNOSIS — D631 Anemia in chronic kidney disease: Secondary | ICD-10-CM | POA: Diagnosis not present

## 2024-03-11 DIAGNOSIS — N250 Renal osteodystrophy: Secondary | ICD-10-CM | POA: Diagnosis not present

## 2024-03-11 DIAGNOSIS — N2581 Secondary hyperparathyroidism of renal origin: Secondary | ICD-10-CM | POA: Diagnosis not present

## 2024-03-11 DIAGNOSIS — Z992 Dependence on renal dialysis: Secondary | ICD-10-CM | POA: Diagnosis not present

## 2024-03-11 DIAGNOSIS — D509 Iron deficiency anemia, unspecified: Secondary | ICD-10-CM | POA: Diagnosis not present

## 2024-03-13 DIAGNOSIS — N250 Renal osteodystrophy: Secondary | ICD-10-CM | POA: Diagnosis not present

## 2024-03-13 DIAGNOSIS — D631 Anemia in chronic kidney disease: Secondary | ICD-10-CM | POA: Diagnosis not present

## 2024-03-13 DIAGNOSIS — N186 End stage renal disease: Secondary | ICD-10-CM | POA: Diagnosis not present

## 2024-03-13 DIAGNOSIS — Z992 Dependence on renal dialysis: Secondary | ICD-10-CM | POA: Diagnosis not present

## 2024-03-13 DIAGNOSIS — D509 Iron deficiency anemia, unspecified: Secondary | ICD-10-CM | POA: Diagnosis not present

## 2024-03-13 DIAGNOSIS — N2581 Secondary hyperparathyroidism of renal origin: Secondary | ICD-10-CM | POA: Diagnosis not present

## 2024-03-15 DIAGNOSIS — N186 End stage renal disease: Secondary | ICD-10-CM | POA: Diagnosis not present

## 2024-03-15 DIAGNOSIS — D631 Anemia in chronic kidney disease: Secondary | ICD-10-CM | POA: Diagnosis not present

## 2024-03-15 DIAGNOSIS — N250 Renal osteodystrophy: Secondary | ICD-10-CM | POA: Diagnosis not present

## 2024-03-15 DIAGNOSIS — D509 Iron deficiency anemia, unspecified: Secondary | ICD-10-CM | POA: Diagnosis not present

## 2024-03-15 DIAGNOSIS — N2581 Secondary hyperparathyroidism of renal origin: Secondary | ICD-10-CM | POA: Diagnosis not present

## 2024-03-15 DIAGNOSIS — Z992 Dependence on renal dialysis: Secondary | ICD-10-CM | POA: Diagnosis not present

## 2024-03-17 DIAGNOSIS — B351 Tinea unguium: Secondary | ICD-10-CM | POA: Diagnosis not present

## 2024-03-17 DIAGNOSIS — E1140 Type 2 diabetes mellitus with diabetic neuropathy, unspecified: Secondary | ICD-10-CM | POA: Diagnosis not present

## 2024-03-18 DIAGNOSIS — Z992 Dependence on renal dialysis: Secondary | ICD-10-CM | POA: Diagnosis not present

## 2024-03-18 DIAGNOSIS — D509 Iron deficiency anemia, unspecified: Secondary | ICD-10-CM | POA: Diagnosis not present

## 2024-03-18 DIAGNOSIS — N186 End stage renal disease: Secondary | ICD-10-CM | POA: Diagnosis not present

## 2024-03-18 DIAGNOSIS — N2581 Secondary hyperparathyroidism of renal origin: Secondary | ICD-10-CM | POA: Diagnosis not present

## 2024-03-18 DIAGNOSIS — N250 Renal osteodystrophy: Secondary | ICD-10-CM | POA: Diagnosis not present

## 2024-03-18 DIAGNOSIS — D631 Anemia in chronic kidney disease: Secondary | ICD-10-CM | POA: Diagnosis not present

## 2024-03-20 DIAGNOSIS — D631 Anemia in chronic kidney disease: Secondary | ICD-10-CM | POA: Diagnosis not present

## 2024-03-20 DIAGNOSIS — D509 Iron deficiency anemia, unspecified: Secondary | ICD-10-CM | POA: Diagnosis not present

## 2024-03-20 DIAGNOSIS — N2581 Secondary hyperparathyroidism of renal origin: Secondary | ICD-10-CM | POA: Diagnosis not present

## 2024-03-20 DIAGNOSIS — N186 End stage renal disease: Secondary | ICD-10-CM | POA: Diagnosis not present

## 2024-03-20 DIAGNOSIS — Z992 Dependence on renal dialysis: Secondary | ICD-10-CM | POA: Diagnosis not present

## 2024-03-20 DIAGNOSIS — N250 Renal osteodystrophy: Secondary | ICD-10-CM | POA: Diagnosis not present

## 2024-03-22 DIAGNOSIS — N186 End stage renal disease: Secondary | ICD-10-CM | POA: Diagnosis not present

## 2024-03-22 DIAGNOSIS — Z992 Dependence on renal dialysis: Secondary | ICD-10-CM | POA: Diagnosis not present

## 2024-03-22 DIAGNOSIS — D631 Anemia in chronic kidney disease: Secondary | ICD-10-CM | POA: Diagnosis not present

## 2024-03-22 DIAGNOSIS — D509 Iron deficiency anemia, unspecified: Secondary | ICD-10-CM | POA: Diagnosis not present

## 2024-03-22 DIAGNOSIS — N2581 Secondary hyperparathyroidism of renal origin: Secondary | ICD-10-CM | POA: Diagnosis not present

## 2024-03-22 DIAGNOSIS — N250 Renal osteodystrophy: Secondary | ICD-10-CM | POA: Diagnosis not present

## 2024-03-24 DIAGNOSIS — L570 Actinic keratosis: Secondary | ICD-10-CM | POA: Diagnosis not present

## 2024-03-24 DIAGNOSIS — L719 Rosacea, unspecified: Secondary | ICD-10-CM | POA: Diagnosis not present

## 2024-03-24 DIAGNOSIS — L309 Dermatitis, unspecified: Secondary | ICD-10-CM | POA: Diagnosis not present

## 2024-03-25 DIAGNOSIS — N250 Renal osteodystrophy: Secondary | ICD-10-CM | POA: Diagnosis not present

## 2024-03-25 DIAGNOSIS — D509 Iron deficiency anemia, unspecified: Secondary | ICD-10-CM | POA: Diagnosis not present

## 2024-03-25 DIAGNOSIS — D631 Anemia in chronic kidney disease: Secondary | ICD-10-CM | POA: Diagnosis not present

## 2024-03-25 DIAGNOSIS — Z992 Dependence on renal dialysis: Secondary | ICD-10-CM | POA: Diagnosis not present

## 2024-03-25 DIAGNOSIS — N186 End stage renal disease: Secondary | ICD-10-CM | POA: Diagnosis not present

## 2024-03-25 DIAGNOSIS — N2581 Secondary hyperparathyroidism of renal origin: Secondary | ICD-10-CM | POA: Diagnosis not present

## 2024-03-27 DIAGNOSIS — N2581 Secondary hyperparathyroidism of renal origin: Secondary | ICD-10-CM | POA: Diagnosis not present

## 2024-03-27 DIAGNOSIS — Z992 Dependence on renal dialysis: Secondary | ICD-10-CM | POA: Diagnosis not present

## 2024-03-27 DIAGNOSIS — N250 Renal osteodystrophy: Secondary | ICD-10-CM | POA: Diagnosis not present

## 2024-03-27 DIAGNOSIS — N186 End stage renal disease: Secondary | ICD-10-CM | POA: Diagnosis not present

## 2024-03-27 DIAGNOSIS — D509 Iron deficiency anemia, unspecified: Secondary | ICD-10-CM | POA: Diagnosis not present

## 2024-03-27 DIAGNOSIS — D631 Anemia in chronic kidney disease: Secondary | ICD-10-CM | POA: Diagnosis not present

## 2024-03-29 DIAGNOSIS — N250 Renal osteodystrophy: Secondary | ICD-10-CM | POA: Diagnosis not present

## 2024-03-29 DIAGNOSIS — N186 End stage renal disease: Secondary | ICD-10-CM | POA: Diagnosis not present

## 2024-03-29 DIAGNOSIS — N2581 Secondary hyperparathyroidism of renal origin: Secondary | ICD-10-CM | POA: Diagnosis not present

## 2024-03-29 DIAGNOSIS — D631 Anemia in chronic kidney disease: Secondary | ICD-10-CM | POA: Diagnosis not present

## 2024-03-29 DIAGNOSIS — D509 Iron deficiency anemia, unspecified: Secondary | ICD-10-CM | POA: Diagnosis not present

## 2024-03-29 DIAGNOSIS — Z992 Dependence on renal dialysis: Secondary | ICD-10-CM | POA: Diagnosis not present

## 2024-04-01 DIAGNOSIS — N250 Renal osteodystrophy: Secondary | ICD-10-CM | POA: Diagnosis not present

## 2024-04-01 DIAGNOSIS — Z992 Dependence on renal dialysis: Secondary | ICD-10-CM | POA: Diagnosis not present

## 2024-04-01 DIAGNOSIS — N2581 Secondary hyperparathyroidism of renal origin: Secondary | ICD-10-CM | POA: Diagnosis not present

## 2024-04-01 DIAGNOSIS — D631 Anemia in chronic kidney disease: Secondary | ICD-10-CM | POA: Diagnosis not present

## 2024-04-01 DIAGNOSIS — D509 Iron deficiency anemia, unspecified: Secondary | ICD-10-CM | POA: Diagnosis not present

## 2024-04-01 DIAGNOSIS — N186 End stage renal disease: Secondary | ICD-10-CM | POA: Diagnosis not present

## 2024-04-03 DIAGNOSIS — N250 Renal osteodystrophy: Secondary | ICD-10-CM | POA: Diagnosis not present

## 2024-04-03 DIAGNOSIS — D631 Anemia in chronic kidney disease: Secondary | ICD-10-CM | POA: Diagnosis not present

## 2024-04-03 DIAGNOSIS — N2581 Secondary hyperparathyroidism of renal origin: Secondary | ICD-10-CM | POA: Diagnosis not present

## 2024-04-03 DIAGNOSIS — Z992 Dependence on renal dialysis: Secondary | ICD-10-CM | POA: Diagnosis not present

## 2024-04-03 DIAGNOSIS — D509 Iron deficiency anemia, unspecified: Secondary | ICD-10-CM | POA: Diagnosis not present

## 2024-04-03 DIAGNOSIS — N186 End stage renal disease: Secondary | ICD-10-CM | POA: Diagnosis not present

## 2024-04-05 DIAGNOSIS — D509 Iron deficiency anemia, unspecified: Secondary | ICD-10-CM | POA: Diagnosis not present

## 2024-04-05 DIAGNOSIS — N186 End stage renal disease: Secondary | ICD-10-CM | POA: Diagnosis not present

## 2024-04-05 DIAGNOSIS — D631 Anemia in chronic kidney disease: Secondary | ICD-10-CM | POA: Diagnosis not present

## 2024-04-05 DIAGNOSIS — N2581 Secondary hyperparathyroidism of renal origin: Secondary | ICD-10-CM | POA: Diagnosis not present

## 2024-04-05 DIAGNOSIS — Z992 Dependence on renal dialysis: Secondary | ICD-10-CM | POA: Diagnosis not present

## 2024-04-05 DIAGNOSIS — N250 Renal osteodystrophy: Secondary | ICD-10-CM | POA: Diagnosis not present

## 2024-04-08 DIAGNOSIS — N2581 Secondary hyperparathyroidism of renal origin: Secondary | ICD-10-CM | POA: Diagnosis not present

## 2024-04-08 DIAGNOSIS — N186 End stage renal disease: Secondary | ICD-10-CM | POA: Diagnosis not present

## 2024-04-08 DIAGNOSIS — N250 Renal osteodystrophy: Secondary | ICD-10-CM | POA: Diagnosis not present

## 2024-04-08 DIAGNOSIS — Z992 Dependence on renal dialysis: Secondary | ICD-10-CM | POA: Diagnosis not present

## 2024-04-08 DIAGNOSIS — D631 Anemia in chronic kidney disease: Secondary | ICD-10-CM | POA: Diagnosis not present

## 2024-04-08 DIAGNOSIS — D509 Iron deficiency anemia, unspecified: Secondary | ICD-10-CM | POA: Diagnosis not present

## 2024-04-09 DIAGNOSIS — N186 End stage renal disease: Secondary | ICD-10-CM | POA: Diagnosis not present

## 2024-04-10 DIAGNOSIS — Z23 Encounter for immunization: Secondary | ICD-10-CM | POA: Diagnosis not present

## 2024-04-10 DIAGNOSIS — D509 Iron deficiency anemia, unspecified: Secondary | ICD-10-CM | POA: Diagnosis not present

## 2024-04-10 DIAGNOSIS — Z992 Dependence on renal dialysis: Secondary | ICD-10-CM | POA: Diagnosis not present

## 2024-04-10 DIAGNOSIS — N186 End stage renal disease: Secondary | ICD-10-CM | POA: Diagnosis not present

## 2024-04-10 DIAGNOSIS — N250 Renal osteodystrophy: Secondary | ICD-10-CM | POA: Diagnosis not present

## 2024-04-10 DIAGNOSIS — N2581 Secondary hyperparathyroidism of renal origin: Secondary | ICD-10-CM | POA: Diagnosis not present

## 2024-04-10 DIAGNOSIS — D631 Anemia in chronic kidney disease: Secondary | ICD-10-CM | POA: Diagnosis not present

## 2024-04-12 DIAGNOSIS — D509 Iron deficiency anemia, unspecified: Secondary | ICD-10-CM | POA: Diagnosis not present

## 2024-04-12 DIAGNOSIS — N186 End stage renal disease: Secondary | ICD-10-CM | POA: Diagnosis not present

## 2024-04-12 DIAGNOSIS — Z23 Encounter for immunization: Secondary | ICD-10-CM | POA: Diagnosis not present

## 2024-04-12 DIAGNOSIS — N2581 Secondary hyperparathyroidism of renal origin: Secondary | ICD-10-CM | POA: Diagnosis not present

## 2024-04-12 DIAGNOSIS — D631 Anemia in chronic kidney disease: Secondary | ICD-10-CM | POA: Diagnosis not present

## 2024-04-12 DIAGNOSIS — Z992 Dependence on renal dialysis: Secondary | ICD-10-CM | POA: Diagnosis not present

## 2024-04-15 DIAGNOSIS — D509 Iron deficiency anemia, unspecified: Secondary | ICD-10-CM | POA: Diagnosis not present

## 2024-04-15 DIAGNOSIS — D631 Anemia in chronic kidney disease: Secondary | ICD-10-CM | POA: Diagnosis not present

## 2024-04-15 DIAGNOSIS — Z23 Encounter for immunization: Secondary | ICD-10-CM | POA: Diagnosis not present

## 2024-04-15 DIAGNOSIS — Z992 Dependence on renal dialysis: Secondary | ICD-10-CM | POA: Diagnosis not present

## 2024-04-15 DIAGNOSIS — N2581 Secondary hyperparathyroidism of renal origin: Secondary | ICD-10-CM | POA: Diagnosis not present

## 2024-04-15 DIAGNOSIS — N186 End stage renal disease: Secondary | ICD-10-CM | POA: Diagnosis not present

## 2024-04-17 DIAGNOSIS — N186 End stage renal disease: Secondary | ICD-10-CM | POA: Diagnosis not present

## 2024-04-17 DIAGNOSIS — N2581 Secondary hyperparathyroidism of renal origin: Secondary | ICD-10-CM | POA: Diagnosis not present

## 2024-04-17 DIAGNOSIS — Z23 Encounter for immunization: Secondary | ICD-10-CM | POA: Diagnosis not present

## 2024-04-17 DIAGNOSIS — Z992 Dependence on renal dialysis: Secondary | ICD-10-CM | POA: Diagnosis not present

## 2024-04-17 DIAGNOSIS — D631 Anemia in chronic kidney disease: Secondary | ICD-10-CM | POA: Diagnosis not present

## 2024-04-17 DIAGNOSIS — D509 Iron deficiency anemia, unspecified: Secondary | ICD-10-CM | POA: Diagnosis not present

## 2024-04-19 DIAGNOSIS — D631 Anemia in chronic kidney disease: Secondary | ICD-10-CM | POA: Diagnosis not present

## 2024-04-19 DIAGNOSIS — N186 End stage renal disease: Secondary | ICD-10-CM | POA: Diagnosis not present

## 2024-04-19 DIAGNOSIS — N2581 Secondary hyperparathyroidism of renal origin: Secondary | ICD-10-CM | POA: Diagnosis not present

## 2024-04-19 DIAGNOSIS — Z23 Encounter for immunization: Secondary | ICD-10-CM | POA: Diagnosis not present

## 2024-04-19 DIAGNOSIS — Z992 Dependence on renal dialysis: Secondary | ICD-10-CM | POA: Diagnosis not present

## 2024-04-19 DIAGNOSIS — D509 Iron deficiency anemia, unspecified: Secondary | ICD-10-CM | POA: Diagnosis not present

## 2024-04-22 DIAGNOSIS — N2581 Secondary hyperparathyroidism of renal origin: Secondary | ICD-10-CM | POA: Diagnosis not present

## 2024-04-22 DIAGNOSIS — Z992 Dependence on renal dialysis: Secondary | ICD-10-CM | POA: Diagnosis not present

## 2024-04-22 DIAGNOSIS — D631 Anemia in chronic kidney disease: Secondary | ICD-10-CM | POA: Diagnosis not present

## 2024-04-22 DIAGNOSIS — Z23 Encounter for immunization: Secondary | ICD-10-CM | POA: Diagnosis not present

## 2024-04-22 DIAGNOSIS — D509 Iron deficiency anemia, unspecified: Secondary | ICD-10-CM | POA: Diagnosis not present

## 2024-04-22 DIAGNOSIS — N186 End stage renal disease: Secondary | ICD-10-CM | POA: Diagnosis not present

## 2024-04-23 DIAGNOSIS — Z87891 Personal history of nicotine dependence: Secondary | ICD-10-CM | POA: Diagnosis not present

## 2024-04-23 DIAGNOSIS — Z992 Dependence on renal dialysis: Secondary | ICD-10-CM | POA: Diagnosis not present

## 2024-04-23 DIAGNOSIS — I129 Hypertensive chronic kidney disease with stage 1 through stage 4 chronic kidney disease, or unspecified chronic kidney disease: Secondary | ICD-10-CM | POA: Diagnosis not present

## 2024-04-23 DIAGNOSIS — N186 End stage renal disease: Secondary | ICD-10-CM | POA: Diagnosis not present

## 2024-04-23 DIAGNOSIS — J9611 Chronic respiratory failure with hypoxia: Secondary | ICD-10-CM | POA: Diagnosis not present

## 2024-04-23 DIAGNOSIS — E875 Hyperkalemia: Secondary | ICD-10-CM | POA: Diagnosis not present

## 2024-04-23 DIAGNOSIS — Z0000 Encounter for general adult medical examination without abnormal findings: Secondary | ICD-10-CM | POA: Diagnosis not present

## 2024-04-23 DIAGNOSIS — F411 Generalized anxiety disorder: Secondary | ICD-10-CM | POA: Diagnosis not present

## 2024-04-23 DIAGNOSIS — D638 Anemia in other chronic diseases classified elsewhere: Secondary | ICD-10-CM | POA: Diagnosis not present

## 2024-04-23 DIAGNOSIS — N189 Chronic kidney disease, unspecified: Secondary | ICD-10-CM | POA: Diagnosis not present

## 2024-04-23 DIAGNOSIS — E1122 Type 2 diabetes mellitus with diabetic chronic kidney disease: Secondary | ICD-10-CM | POA: Diagnosis not present

## 2024-04-23 DIAGNOSIS — I5032 Chronic diastolic (congestive) heart failure: Secondary | ICD-10-CM | POA: Diagnosis not present

## 2024-04-24 DIAGNOSIS — N186 End stage renal disease: Secondary | ICD-10-CM | POA: Diagnosis not present

## 2024-04-24 DIAGNOSIS — Z23 Encounter for immunization: Secondary | ICD-10-CM | POA: Diagnosis not present

## 2024-04-24 DIAGNOSIS — D509 Iron deficiency anemia, unspecified: Secondary | ICD-10-CM | POA: Diagnosis not present

## 2024-04-24 DIAGNOSIS — D631 Anemia in chronic kidney disease: Secondary | ICD-10-CM | POA: Diagnosis not present

## 2024-04-24 DIAGNOSIS — N2581 Secondary hyperparathyroidism of renal origin: Secondary | ICD-10-CM | POA: Diagnosis not present

## 2024-04-24 DIAGNOSIS — Z992 Dependence on renal dialysis: Secondary | ICD-10-CM | POA: Diagnosis not present

## 2024-04-26 DIAGNOSIS — N186 End stage renal disease: Secondary | ICD-10-CM | POA: Diagnosis not present

## 2024-04-26 DIAGNOSIS — D631 Anemia in chronic kidney disease: Secondary | ICD-10-CM | POA: Diagnosis not present

## 2024-04-26 DIAGNOSIS — D509 Iron deficiency anemia, unspecified: Secondary | ICD-10-CM | POA: Diagnosis not present

## 2024-04-26 DIAGNOSIS — Z23 Encounter for immunization: Secondary | ICD-10-CM | POA: Diagnosis not present

## 2024-04-26 DIAGNOSIS — N2581 Secondary hyperparathyroidism of renal origin: Secondary | ICD-10-CM | POA: Diagnosis not present

## 2024-04-26 DIAGNOSIS — Z992 Dependence on renal dialysis: Secondary | ICD-10-CM | POA: Diagnosis not present

## 2024-04-29 DIAGNOSIS — D631 Anemia in chronic kidney disease: Secondary | ICD-10-CM | POA: Diagnosis not present

## 2024-04-29 DIAGNOSIS — N186 End stage renal disease: Secondary | ICD-10-CM | POA: Diagnosis not present

## 2024-04-29 DIAGNOSIS — Z992 Dependence on renal dialysis: Secondary | ICD-10-CM | POA: Diagnosis not present

## 2024-04-29 DIAGNOSIS — D509 Iron deficiency anemia, unspecified: Secondary | ICD-10-CM | POA: Diagnosis not present

## 2024-04-29 DIAGNOSIS — Z23 Encounter for immunization: Secondary | ICD-10-CM | POA: Diagnosis not present

## 2024-04-29 DIAGNOSIS — N2581 Secondary hyperparathyroidism of renal origin: Secondary | ICD-10-CM | POA: Diagnosis not present

## 2024-05-01 DIAGNOSIS — Z992 Dependence on renal dialysis: Secondary | ICD-10-CM | POA: Diagnosis not present

## 2024-05-01 DIAGNOSIS — D509 Iron deficiency anemia, unspecified: Secondary | ICD-10-CM | POA: Diagnosis not present

## 2024-05-01 DIAGNOSIS — N2581 Secondary hyperparathyroidism of renal origin: Secondary | ICD-10-CM | POA: Diagnosis not present

## 2024-05-01 DIAGNOSIS — D631 Anemia in chronic kidney disease: Secondary | ICD-10-CM | POA: Diagnosis not present

## 2024-05-01 DIAGNOSIS — Z23 Encounter for immunization: Secondary | ICD-10-CM | POA: Diagnosis not present

## 2024-05-01 DIAGNOSIS — N186 End stage renal disease: Secondary | ICD-10-CM | POA: Diagnosis not present

## 2024-05-03 DIAGNOSIS — Z23 Encounter for immunization: Secondary | ICD-10-CM | POA: Diagnosis not present

## 2024-05-03 DIAGNOSIS — D631 Anemia in chronic kidney disease: Secondary | ICD-10-CM | POA: Diagnosis not present

## 2024-05-03 DIAGNOSIS — N2581 Secondary hyperparathyroidism of renal origin: Secondary | ICD-10-CM | POA: Diagnosis not present

## 2024-05-03 DIAGNOSIS — D509 Iron deficiency anemia, unspecified: Secondary | ICD-10-CM | POA: Diagnosis not present

## 2024-05-03 DIAGNOSIS — Z992 Dependence on renal dialysis: Secondary | ICD-10-CM | POA: Diagnosis not present

## 2024-05-03 DIAGNOSIS — N186 End stage renal disease: Secondary | ICD-10-CM | POA: Diagnosis not present

## 2024-05-05 DIAGNOSIS — L309 Dermatitis, unspecified: Secondary | ICD-10-CM | POA: Diagnosis not present

## 2024-05-05 DIAGNOSIS — L281 Prurigo nodularis: Secondary | ICD-10-CM | POA: Diagnosis not present

## 2024-05-05 DIAGNOSIS — L570 Actinic keratosis: Secondary | ICD-10-CM | POA: Diagnosis not present

## 2024-05-06 DIAGNOSIS — D631 Anemia in chronic kidney disease: Secondary | ICD-10-CM | POA: Diagnosis not present

## 2024-05-06 DIAGNOSIS — Z23 Encounter for immunization: Secondary | ICD-10-CM | POA: Diagnosis not present

## 2024-05-06 DIAGNOSIS — Z992 Dependence on renal dialysis: Secondary | ICD-10-CM | POA: Diagnosis not present

## 2024-05-06 DIAGNOSIS — N186 End stage renal disease: Secondary | ICD-10-CM | POA: Diagnosis not present

## 2024-05-06 DIAGNOSIS — N2581 Secondary hyperparathyroidism of renal origin: Secondary | ICD-10-CM | POA: Diagnosis not present

## 2024-05-06 DIAGNOSIS — D509 Iron deficiency anemia, unspecified: Secondary | ICD-10-CM | POA: Diagnosis not present

## 2024-05-08 DIAGNOSIS — D509 Iron deficiency anemia, unspecified: Secondary | ICD-10-CM | POA: Diagnosis not present

## 2024-05-08 DIAGNOSIS — N186 End stage renal disease: Secondary | ICD-10-CM | POA: Diagnosis not present

## 2024-05-08 DIAGNOSIS — D631 Anemia in chronic kidney disease: Secondary | ICD-10-CM | POA: Diagnosis not present

## 2024-05-08 DIAGNOSIS — Z992 Dependence on renal dialysis: Secondary | ICD-10-CM | POA: Diagnosis not present

## 2024-05-08 DIAGNOSIS — N2581 Secondary hyperparathyroidism of renal origin: Secondary | ICD-10-CM | POA: Diagnosis not present

## 2024-05-08 DIAGNOSIS — Z23 Encounter for immunization: Secondary | ICD-10-CM | POA: Diagnosis not present

## 2024-05-10 DIAGNOSIS — N250 Renal osteodystrophy: Secondary | ICD-10-CM | POA: Diagnosis not present

## 2024-05-10 DIAGNOSIS — N186 End stage renal disease: Secondary | ICD-10-CM | POA: Diagnosis not present

## 2024-05-10 DIAGNOSIS — D509 Iron deficiency anemia, unspecified: Secondary | ICD-10-CM | POA: Diagnosis not present

## 2024-05-10 DIAGNOSIS — N2581 Secondary hyperparathyroidism of renal origin: Secondary | ICD-10-CM | POA: Diagnosis not present

## 2024-05-10 DIAGNOSIS — Z992 Dependence on renal dialysis: Secondary | ICD-10-CM | POA: Diagnosis not present

## 2024-05-10 DIAGNOSIS — D631 Anemia in chronic kidney disease: Secondary | ICD-10-CM | POA: Diagnosis not present

## 2024-05-13 DIAGNOSIS — D631 Anemia in chronic kidney disease: Secondary | ICD-10-CM | POA: Diagnosis not present

## 2024-05-13 DIAGNOSIS — D509 Iron deficiency anemia, unspecified: Secondary | ICD-10-CM | POA: Diagnosis not present

## 2024-05-13 DIAGNOSIS — N250 Renal osteodystrophy: Secondary | ICD-10-CM | POA: Diagnosis not present

## 2024-05-13 DIAGNOSIS — Z992 Dependence on renal dialysis: Secondary | ICD-10-CM | POA: Diagnosis not present

## 2024-05-13 DIAGNOSIS — N186 End stage renal disease: Secondary | ICD-10-CM | POA: Diagnosis not present

## 2024-05-13 DIAGNOSIS — N2581 Secondary hyperparathyroidism of renal origin: Secondary | ICD-10-CM | POA: Diagnosis not present

## 2024-05-15 DIAGNOSIS — D631 Anemia in chronic kidney disease: Secondary | ICD-10-CM | POA: Diagnosis not present

## 2024-05-15 DIAGNOSIS — N2581 Secondary hyperparathyroidism of renal origin: Secondary | ICD-10-CM | POA: Diagnosis not present

## 2024-05-15 DIAGNOSIS — N186 End stage renal disease: Secondary | ICD-10-CM | POA: Diagnosis not present

## 2024-05-15 DIAGNOSIS — D509 Iron deficiency anemia, unspecified: Secondary | ICD-10-CM | POA: Diagnosis not present

## 2024-05-15 DIAGNOSIS — N250 Renal osteodystrophy: Secondary | ICD-10-CM | POA: Diagnosis not present

## 2024-05-15 DIAGNOSIS — Z992 Dependence on renal dialysis: Secondary | ICD-10-CM | POA: Diagnosis not present

## 2024-05-17 DIAGNOSIS — N250 Renal osteodystrophy: Secondary | ICD-10-CM | POA: Diagnosis not present

## 2024-05-17 DIAGNOSIS — N186 End stage renal disease: Secondary | ICD-10-CM | POA: Diagnosis not present

## 2024-05-17 DIAGNOSIS — D509 Iron deficiency anemia, unspecified: Secondary | ICD-10-CM | POA: Diagnosis not present

## 2024-05-17 DIAGNOSIS — Z992 Dependence on renal dialysis: Secondary | ICD-10-CM | POA: Diagnosis not present

## 2024-05-17 DIAGNOSIS — D631 Anemia in chronic kidney disease: Secondary | ICD-10-CM | POA: Diagnosis not present

## 2024-05-17 DIAGNOSIS — N2581 Secondary hyperparathyroidism of renal origin: Secondary | ICD-10-CM | POA: Diagnosis not present

## 2024-05-20 DIAGNOSIS — I2510 Atherosclerotic heart disease of native coronary artery without angina pectoris: Secondary | ICD-10-CM | POA: Diagnosis not present

## 2024-05-20 DIAGNOSIS — I10 Essential (primary) hypertension: Secondary | ICD-10-CM | POA: Diagnosis not present

## 2024-05-20 DIAGNOSIS — D631 Anemia in chronic kidney disease: Secondary | ICD-10-CM | POA: Diagnosis not present

## 2024-05-20 DIAGNOSIS — N2581 Secondary hyperparathyroidism of renal origin: Secondary | ICD-10-CM | POA: Diagnosis not present

## 2024-05-20 DIAGNOSIS — Z931 Gastrostomy status: Secondary | ICD-10-CM | POA: Diagnosis not present

## 2024-05-20 DIAGNOSIS — N186 End stage renal disease: Secondary | ICD-10-CM | POA: Diagnosis not present

## 2024-05-20 DIAGNOSIS — N250 Renal osteodystrophy: Secondary | ICD-10-CM | POA: Diagnosis not present

## 2024-05-20 DIAGNOSIS — D509 Iron deficiency anemia, unspecified: Secondary | ICD-10-CM | POA: Diagnosis not present

## 2024-05-20 DIAGNOSIS — I509 Heart failure, unspecified: Secondary | ICD-10-CM | POA: Diagnosis not present

## 2024-05-20 DIAGNOSIS — Z992 Dependence on renal dialysis: Secondary | ICD-10-CM | POA: Diagnosis not present

## 2024-05-21 DIAGNOSIS — I509 Heart failure, unspecified: Secondary | ICD-10-CM | POA: Diagnosis not present

## 2024-05-21 DIAGNOSIS — J9611 Chronic respiratory failure with hypoxia: Secondary | ICD-10-CM | POA: Diagnosis not present

## 2024-05-21 DIAGNOSIS — Z87891 Personal history of nicotine dependence: Secondary | ICD-10-CM | POA: Diagnosis not present

## 2024-05-21 DIAGNOSIS — N186 End stage renal disease: Secondary | ICD-10-CM | POA: Diagnosis not present

## 2024-05-21 DIAGNOSIS — Z992 Dependence on renal dialysis: Secondary | ICD-10-CM | POA: Diagnosis not present

## 2024-05-22 DIAGNOSIS — N186 End stage renal disease: Secondary | ICD-10-CM | POA: Diagnosis not present

## 2024-05-22 DIAGNOSIS — N2581 Secondary hyperparathyroidism of renal origin: Secondary | ICD-10-CM | POA: Diagnosis not present

## 2024-05-22 DIAGNOSIS — D631 Anemia in chronic kidney disease: Secondary | ICD-10-CM | POA: Diagnosis not present

## 2024-05-22 DIAGNOSIS — N250 Renal osteodystrophy: Secondary | ICD-10-CM | POA: Diagnosis not present

## 2024-05-22 DIAGNOSIS — D509 Iron deficiency anemia, unspecified: Secondary | ICD-10-CM | POA: Diagnosis not present

## 2024-05-22 DIAGNOSIS — Z992 Dependence on renal dialysis: Secondary | ICD-10-CM | POA: Diagnosis not present

## 2024-05-24 DIAGNOSIS — D509 Iron deficiency anemia, unspecified: Secondary | ICD-10-CM | POA: Diagnosis not present

## 2024-05-24 DIAGNOSIS — N186 End stage renal disease: Secondary | ICD-10-CM | POA: Diagnosis not present

## 2024-05-24 DIAGNOSIS — D631 Anemia in chronic kidney disease: Secondary | ICD-10-CM | POA: Diagnosis not present

## 2024-05-24 DIAGNOSIS — N250 Renal osteodystrophy: Secondary | ICD-10-CM | POA: Diagnosis not present

## 2024-05-24 DIAGNOSIS — Z992 Dependence on renal dialysis: Secondary | ICD-10-CM | POA: Diagnosis not present

## 2024-05-24 DIAGNOSIS — N2581 Secondary hyperparathyroidism of renal origin: Secondary | ICD-10-CM | POA: Diagnosis not present

## 2024-05-27 DIAGNOSIS — Z992 Dependence on renal dialysis: Secondary | ICD-10-CM | POA: Diagnosis not present

## 2024-05-27 DIAGNOSIS — N2581 Secondary hyperparathyroidism of renal origin: Secondary | ICD-10-CM | POA: Diagnosis not present

## 2024-05-27 DIAGNOSIS — D509 Iron deficiency anemia, unspecified: Secondary | ICD-10-CM | POA: Diagnosis not present

## 2024-05-27 DIAGNOSIS — N186 End stage renal disease: Secondary | ICD-10-CM | POA: Diagnosis not present

## 2024-05-27 DIAGNOSIS — N250 Renal osteodystrophy: Secondary | ICD-10-CM | POA: Diagnosis not present

## 2024-05-27 DIAGNOSIS — D631 Anemia in chronic kidney disease: Secondary | ICD-10-CM | POA: Diagnosis not present

## 2024-05-29 DIAGNOSIS — N250 Renal osteodystrophy: Secondary | ICD-10-CM | POA: Diagnosis not present

## 2024-05-29 DIAGNOSIS — N2581 Secondary hyperparathyroidism of renal origin: Secondary | ICD-10-CM | POA: Diagnosis not present

## 2024-05-29 DIAGNOSIS — D631 Anemia in chronic kidney disease: Secondary | ICD-10-CM | POA: Diagnosis not present

## 2024-05-29 DIAGNOSIS — Z992 Dependence on renal dialysis: Secondary | ICD-10-CM | POA: Diagnosis not present

## 2024-05-29 DIAGNOSIS — N186 End stage renal disease: Secondary | ICD-10-CM | POA: Diagnosis not present

## 2024-05-29 DIAGNOSIS — D509 Iron deficiency anemia, unspecified: Secondary | ICD-10-CM | POA: Diagnosis not present

## 2024-05-31 DIAGNOSIS — N2581 Secondary hyperparathyroidism of renal origin: Secondary | ICD-10-CM | POA: Diagnosis not present

## 2024-05-31 DIAGNOSIS — N250 Renal osteodystrophy: Secondary | ICD-10-CM | POA: Diagnosis not present

## 2024-05-31 DIAGNOSIS — Z992 Dependence on renal dialysis: Secondary | ICD-10-CM | POA: Diagnosis not present

## 2024-05-31 DIAGNOSIS — D631 Anemia in chronic kidney disease: Secondary | ICD-10-CM | POA: Diagnosis not present

## 2024-05-31 DIAGNOSIS — D509 Iron deficiency anemia, unspecified: Secondary | ICD-10-CM | POA: Diagnosis not present

## 2024-05-31 DIAGNOSIS — N186 End stage renal disease: Secondary | ICD-10-CM | POA: Diagnosis not present

## 2024-06-03 DIAGNOSIS — N250 Renal osteodystrophy: Secondary | ICD-10-CM | POA: Diagnosis not present

## 2024-06-03 DIAGNOSIS — N186 End stage renal disease: Secondary | ICD-10-CM | POA: Diagnosis not present

## 2024-06-03 DIAGNOSIS — D631 Anemia in chronic kidney disease: Secondary | ICD-10-CM | POA: Diagnosis not present

## 2024-06-03 DIAGNOSIS — D509 Iron deficiency anemia, unspecified: Secondary | ICD-10-CM | POA: Diagnosis not present

## 2024-06-03 DIAGNOSIS — N2581 Secondary hyperparathyroidism of renal origin: Secondary | ICD-10-CM | POA: Diagnosis not present

## 2024-06-03 DIAGNOSIS — Z992 Dependence on renal dialysis: Secondary | ICD-10-CM | POA: Diagnosis not present

## 2024-06-05 DIAGNOSIS — D631 Anemia in chronic kidney disease: Secondary | ICD-10-CM | POA: Diagnosis not present

## 2024-06-05 DIAGNOSIS — Z992 Dependence on renal dialysis: Secondary | ICD-10-CM | POA: Diagnosis not present

## 2024-06-05 DIAGNOSIS — N186 End stage renal disease: Secondary | ICD-10-CM | POA: Diagnosis not present

## 2024-06-05 DIAGNOSIS — N250 Renal osteodystrophy: Secondary | ICD-10-CM | POA: Diagnosis not present

## 2024-06-05 DIAGNOSIS — N2581 Secondary hyperparathyroidism of renal origin: Secondary | ICD-10-CM | POA: Diagnosis not present

## 2024-06-05 DIAGNOSIS — D509 Iron deficiency anemia, unspecified: Secondary | ICD-10-CM | POA: Diagnosis not present

## 2024-06-07 DIAGNOSIS — Z992 Dependence on renal dialysis: Secondary | ICD-10-CM | POA: Diagnosis not present

## 2024-06-07 DIAGNOSIS — N2581 Secondary hyperparathyroidism of renal origin: Secondary | ICD-10-CM | POA: Diagnosis not present

## 2024-06-07 DIAGNOSIS — D631 Anemia in chronic kidney disease: Secondary | ICD-10-CM | POA: Diagnosis not present

## 2024-06-07 DIAGNOSIS — D509 Iron deficiency anemia, unspecified: Secondary | ICD-10-CM | POA: Diagnosis not present

## 2024-06-07 DIAGNOSIS — N250 Renal osteodystrophy: Secondary | ICD-10-CM | POA: Diagnosis not present

## 2024-06-07 DIAGNOSIS — N186 End stage renal disease: Secondary | ICD-10-CM | POA: Diagnosis not present

## 2024-06-13 DIAGNOSIS — H6123 Impacted cerumen, bilateral: Secondary | ICD-10-CM | POA: Diagnosis not present

## 2024-06-13 DIAGNOSIS — H9193 Unspecified hearing loss, bilateral: Secondary | ICD-10-CM | POA: Diagnosis not present
